# Patient Record
Sex: Male | Born: 1950
Health system: Southern US, Community
[De-identification: ages and names within clinical notes are randomized; demographics above are authoritative.]

## PROBLEM LIST (undated history)

## (undated) VITALS — BP 120/77 | HR 102 | Temp 98.4°F | Resp 16

## (undated) DIAGNOSIS — G8929 Other chronic pain: Secondary | ICD-10-CM

## (undated) DIAGNOSIS — R945 Abnormal results of liver function studies: Secondary | ICD-10-CM

## (undated) DIAGNOSIS — F32A Depression, unspecified: Secondary | ICD-10-CM

## (undated) DIAGNOSIS — E785 Hyperlipidemia, unspecified: Secondary | ICD-10-CM

## (undated) DIAGNOSIS — H9190 Unspecified hearing loss, unspecified ear: Secondary | ICD-10-CM

## (undated) DIAGNOSIS — R6889 Other general symptoms and signs: Secondary | ICD-10-CM

## (undated) DIAGNOSIS — J189 Pneumonia, unspecified organism: Secondary | ICD-10-CM

## (undated) DIAGNOSIS — H409 Unspecified glaucoma: Secondary | ICD-10-CM

## (undated) DIAGNOSIS — F1010 Alcohol abuse, uncomplicated: Secondary | ICD-10-CM

## (undated) DIAGNOSIS — Z9989 Dependence on other enabling machines and devices: Secondary | ICD-10-CM

## (undated) DIAGNOSIS — F329 Major depressive disorder, single episode, unspecified: Secondary | ICD-10-CM

## (undated) DIAGNOSIS — K7460 Unspecified cirrhosis of liver: Secondary | ICD-10-CM

## (undated) DIAGNOSIS — J449 Chronic obstructive pulmonary disease, unspecified: Secondary | ICD-10-CM

## (undated) DIAGNOSIS — J439 Emphysema, unspecified: Secondary | ICD-10-CM

## (undated) DIAGNOSIS — F419 Anxiety disorder, unspecified: Secondary | ICD-10-CM

## (undated) DIAGNOSIS — H269 Unspecified cataract: Secondary | ICD-10-CM

## (undated) DIAGNOSIS — I639 Cerebral infarction, unspecified: Secondary | ICD-10-CM

## (undated) DIAGNOSIS — S0993XA Unspecified injury of face, initial encounter: Secondary | ICD-10-CM

## (undated) DIAGNOSIS — D649 Anemia, unspecified: Secondary | ICD-10-CM

## (undated) DIAGNOSIS — J329 Chronic sinusitis, unspecified: Secondary | ICD-10-CM

## (undated) DIAGNOSIS — M779 Enthesopathy, unspecified: Secondary | ICD-10-CM

## (undated) DIAGNOSIS — E119 Type 2 diabetes mellitus without complications: Secondary | ICD-10-CM

## (undated) DIAGNOSIS — M1990 Unspecified osteoarthritis, unspecified site: Secondary | ICD-10-CM

## (undated) DIAGNOSIS — Z7409 Other reduced mobility: Secondary | ICD-10-CM

## (undated) DIAGNOSIS — W19XXXA Unspecified fall, initial encounter: Secondary | ICD-10-CM

## (undated) DIAGNOSIS — I10 Essential (primary) hypertension: Secondary | ICD-10-CM

## (undated) HISTORY — DX: Emphysema, unspecified: J43.9

## (undated) HISTORY — PX: COLONOSCOPY: SHX174

## (undated) HISTORY — DX: Chronic obstructive pulmonary disease, unspecified: J44.9

## (undated) HISTORY — DX: Major depressive disorder, single episode, unspecified: F32.9

## (undated) HISTORY — DX: Unspecified glaucoma: H40.9

## (undated) HISTORY — PX: MOUTH SURGERY: SHX715

## (undated) HISTORY — DX: Depression, unspecified: F32.A

## (undated) HISTORY — PX: CATARACT EXTRACTION W/ INTRAOCULAR LENS IMPLANT: SHX1309

## (undated) HISTORY — DX: Unspecified cataract: H26.9

---

## 2002-04-08 ENCOUNTER — Emergency Department (HOSPITAL_COMMUNITY): Admission: EM | Admit: 2002-04-08 | Discharge: 2002-04-08 | Payer: Self-pay | Admitting: Emergency Medicine

## 2004-03-26 ENCOUNTER — Emergency Department (HOSPITAL_COMMUNITY): Admission: EM | Admit: 2004-03-26 | Discharge: 2004-03-26 | Payer: Self-pay | Admitting: *Deleted

## 2009-08-08 ENCOUNTER — Emergency Department (HOSPITAL_COMMUNITY): Admission: EM | Admit: 2009-08-08 | Discharge: 2009-08-08 | Payer: Self-pay | Admitting: Emergency Medicine

## 2010-01-30 ENCOUNTER — Emergency Department (HOSPITAL_COMMUNITY): Admission: EM | Admit: 2010-01-30 | Discharge: 2010-01-30 | Payer: Self-pay | Admitting: Emergency Medicine

## 2010-01-31 ENCOUNTER — Emergency Department (HOSPITAL_COMMUNITY): Admission: EM | Admit: 2010-01-31 | Discharge: 2010-02-01 | Payer: Self-pay | Admitting: Emergency Medicine

## 2010-02-01 ENCOUNTER — Emergency Department (HOSPITAL_COMMUNITY): Admission: EM | Admit: 2010-02-01 | Discharge: 2010-02-01 | Payer: Self-pay | Admitting: Emergency Medicine

## 2010-02-11 ENCOUNTER — Emergency Department (HOSPITAL_COMMUNITY): Admission: EM | Admit: 2010-02-11 | Discharge: 2010-02-11 | Payer: Self-pay | Admitting: Emergency Medicine

## 2010-06-24 ENCOUNTER — Emergency Department (HOSPITAL_COMMUNITY)
Admission: EM | Admit: 2010-06-24 | Discharge: 2010-06-25 | Payer: Self-pay | Source: Home / Self Care | Admitting: Emergency Medicine

## 2010-09-10 ENCOUNTER — Inpatient Hospital Stay (INDEPENDENT_AMBULATORY_CARE_PROVIDER_SITE_OTHER)
Admission: RE | Admit: 2010-09-10 | Discharge: 2010-09-10 | Disposition: A | Discharge status: Home / Self Care | Payer: Self-pay | Source: Ambulatory Visit | Attending: Family Medicine | Admitting: Family Medicine

## 2010-09-10 ENCOUNTER — Ambulatory Visit (INDEPENDENT_AMBULATORY_CARE_PROVIDER_SITE_OTHER): Payer: Self-pay

## 2010-09-10 DIAGNOSIS — S42009A Fracture of unspecified part of unspecified clavicle, initial encounter for closed fracture: Secondary | ICD-10-CM

## 2010-09-16 LAB — BASIC METABOLIC PANEL
Creatinine, Ser: 0.99 mg/dL (ref 0.4–1.5)
GFR calc non Af Amer: >60 mL/min (ref >60)
Sodium: 136 mEq/L (ref 135–145)

## 2010-09-16 LAB — DIFFERENTIAL
Basophils Absolute: 0 10*3/uL (ref 0.0–0.1)
Basophils Relative: 1 % (ref 0–1)
Eosinophils Relative: 1 % (ref 0–5)
Monocytes Absolute: 0.9 10*3/uL (ref 0.1–1.0)
Neutro Abs: 1.5 10*3/uL — ABNORMAL LOW (ref 1.7–7.7)
Neutrophils Relative %: 39 % — ABNORMAL LOW (ref 43–77)

## 2010-09-16 LAB — URINALYSIS, ROUTINE W REFLEX MICROSCOPIC
Glucose, UA: NEGATIVE mg/dL
Hgb urine dipstick: NEGATIVE
Ketones, ur: NEGATIVE mg/dL

## 2010-09-16 LAB — CBC
Hemoglobin: 12.7 g/dL — ABNORMAL LOW (ref 13.0–17.0)
MCHC: 34.1 g/dL (ref 30.0–36.0)
MCV: 76.1 fL — ABNORMAL LOW (ref 78.0–100.0)
Platelets: 288 10*3/uL (ref 150–400)
RBC: 4.89 MIL/uL (ref 4.22–5.81)
RDW: 15.1 % (ref 11.5–15.5)

## 2010-09-16 LAB — RAPID URINE DRUG SCREEN, HOSP PERFORMED
Cocaine: POSITIVE — AB
Opiates: NOT DETECTED
Tetrahydrocannabinol: NOT DETECTED

## 2010-09-16 LAB — ETHANOL: Alcohol, Ethyl (B): <5 mg/dL (ref 0–10)

## 2010-09-20 LAB — HEPATIC FUNCTION PANEL
AST: 58 U/L — ABNORMAL HIGH (ref 0–37)
Bilirubin, Direct: 0.1 mg/dL (ref 0.0–0.3)
Indirect Bilirubin: 0.6 mg/dL (ref 0.3–0.9)
Total Bilirubin: 0.7 mg/dL (ref 0.3–1.2)
Total Protein: 8.1 g/dL (ref 6.0–8.3)

## 2010-09-20 LAB — DIFFERENTIAL
Eosinophils Absolute: 0 10*3/uL (ref 0.0–0.7)
Eosinophils Relative: 0 % (ref 0–5)
Lymphocytes Relative: 42 % (ref 12–46)
Metamyelocytes Relative: 0 %
Monocytes Absolute: 0.2 10*3/uL (ref 0.1–1.0)
Neutro Abs: 2.9 10*3/uL (ref 1.7–7.7)

## 2010-09-20 LAB — BASIC METABOLIC PANEL
Calcium: 9 mg/dL (ref 8.4–10.5)
Chloride: 94 mEq/L — ABNORMAL LOW (ref 96–112)
GFR calc Af Amer: >60 mL/min (ref >60)
Glucose, Bld: 86 mg/dL (ref 70–99)
Sodium: 130 mEq/L — ABNORMAL LOW (ref 135–145)

## 2010-09-20 LAB — CBC
HCT: 33.7 % — ABNORMAL LOW (ref 39.0–52.0)
Hemoglobin: 11.3 g/dL — ABNORMAL LOW (ref 13.0–17.0)
MCH: 27.6 pg (ref 26.0–34.0)
MCV: 82.6 fL (ref 78.0–100.0)
Platelets: 327 10*3/uL (ref 150–400)
RDW: 16 % — ABNORMAL HIGH (ref 11.5–15.5)
WBC: 5.4 10*3/uL (ref 4.0–10.5)

## 2010-09-20 LAB — ETHANOL: Alcohol, Ethyl (B): 85 mg/dL — ABNORMAL HIGH (ref 0–10)

## 2010-09-20 LAB — TRICYCLICS SCREEN, URINE: TCA Scrn: NOT DETECTED

## 2010-09-20 LAB — RAPID URINE DRUG SCREEN, HOSP PERFORMED
Barbiturates: NOT DETECTED
Cocaine: NOT DETECTED

## 2010-09-21 LAB — DIFFERENTIAL: Eosinophils Relative: 0 % (ref 0–5)

## 2010-09-21 LAB — CBC
Hemoglobin: 11.6 g/dL — ABNORMAL LOW (ref 13.0–17.0)
MCHC: 32.5 g/dL (ref 30.0–36.0)
MCV: 84.1 fL (ref 78.0–100.0)
Platelets: 219 10*3/uL (ref 150–400)
RBC: 4.24 MIL/uL (ref 4.22–5.81)
RDW: 15.9 % — ABNORMAL HIGH (ref 11.5–15.5)
WBC: 3.7 10*3/uL — ABNORMAL LOW (ref 4.0–10.5)

## 2010-09-21 LAB — ETHANOL: Alcohol, Ethyl (B): 268 mg/dL — ABNORMAL HIGH (ref 0–10)

## 2010-09-21 LAB — COMPREHENSIVE METABOLIC PANEL
Albumin: 4.5 g/dL (ref 3.5–5.2)
Alkaline Phosphatase: 71 U/L (ref 39–117)
BUN: 6 mg/dL (ref 6–23)
GFR calc Af Amer: >60 mL/min (ref >60)
GFR calc non Af Amer: >60 mL/min (ref >60)
Glucose, Bld: 82 mg/dL (ref 70–99)
Potassium: 4.5 mEq/L (ref 3.5–5.1)
Sodium: 138 mEq/L (ref 135–145)
Total Bilirubin: 0.4 mg/dL (ref 0.3–1.2)

## 2010-09-21 LAB — RAPID URINE DRUG SCREEN, HOSP PERFORMED
Cocaine: NOT DETECTED
Opiates: NOT DETECTED
Tetrahydrocannabinol: NOT DETECTED

## 2010-12-25 ENCOUNTER — Emergency Department (HOSPITAL_COMMUNITY)
Admission: EM | Admit: 2010-12-25 | Discharge: 2010-12-26 | Disposition: A | Discharge status: Home / Self Care | Payer: Self-pay | Attending: Emergency Medicine | Admitting: Emergency Medicine

## 2010-12-25 DIAGNOSIS — R062 Wheezing: Secondary | ICD-10-CM | POA: Insufficient documentation

## 2010-12-25 DIAGNOSIS — R29898 Other symptoms and signs involving the musculoskeletal system: Secondary | ICD-10-CM | POA: Insufficient documentation

## 2010-12-25 DIAGNOSIS — F1010 Alcohol abuse, uncomplicated: Secondary | ICD-10-CM | POA: Insufficient documentation

## 2010-12-25 DIAGNOSIS — M25519 Pain in unspecified shoulder: Secondary | ICD-10-CM | POA: Insufficient documentation

## 2010-12-26 ENCOUNTER — Emergency Department (HOSPITAL_COMMUNITY): Payer: Self-pay

## 2010-12-26 LAB — CBC
Hemoglobin: 11 g/dL — ABNORMAL LOW (ref 13.0–17.0)
MCH: 26.4 pg (ref 26.0–34.0)
MCV: 78.6 fL (ref 78.0–100.0)
Platelets: 193 10*3/uL (ref 150–400)
RDW: 16.8 % — ABNORMAL HIGH (ref 11.5–15.5)
WBC: 5.5 10*3/uL (ref 4.0–10.5)

## 2010-12-26 LAB — DIFFERENTIAL
Basophils Absolute: 0.1 10*3/uL (ref 0.0–0.1)
Eosinophils Absolute: 0 10*3/uL (ref 0.0–0.7)
Eosinophils Relative: 0 % (ref 0–5)
Lymphocytes Relative: 25 % (ref 12–46)
Lymphs Abs: 1.4 10*3/uL (ref 0.7–4.0)
Monocytes Relative: 10 % (ref 3–12)
Neutro Abs: 3.5 10*3/uL (ref 1.7–7.7)
Neutrophils Relative %: 64 % (ref 43–77)

## 2010-12-26 LAB — URINE MICROSCOPIC-ADD ON

## 2010-12-26 LAB — URINALYSIS, ROUTINE W REFLEX MICROSCOPIC
Bilirubin Urine: NEGATIVE
Nitrite: NEGATIVE
Specific Gravity, Urine: 1.022 (ref 1.005–1.030)
pH: 5.5 (ref 5.0–8.0)

## 2010-12-26 LAB — ETHANOL: Alcohol, Ethyl (B): 135 mg/dL — ABNORMAL HIGH (ref 0–11)

## 2010-12-26 LAB — BASIC METABOLIC PANEL
BUN: 7 mg/dL (ref 6–23)
Chloride: 99 mEq/L (ref 96–112)
GFR calc Af Amer: >60 mL/min (ref >60)
Glucose, Bld: 93 mg/dL (ref 70–99)
Potassium: 4 mEq/L (ref 3.5–5.1)

## 2010-12-26 LAB — RAPID URINE DRUG SCREEN, HOSP PERFORMED
Amphetamines: NOT DETECTED
Barbiturates: NOT DETECTED

## 2011-02-12 ENCOUNTER — Emergency Department (HOSPITAL_COMMUNITY)
Admission: EM | Admit: 2011-02-12 | Discharge: 2011-02-13 | Disposition: A | Discharge status: Home / Self Care | Payer: Self-pay | Attending: Emergency Medicine | Admitting: Emergency Medicine

## 2011-02-12 ENCOUNTER — Emergency Department (HOSPITAL_COMMUNITY): Payer: Self-pay

## 2011-02-12 DIAGNOSIS — R05 Cough: Secondary | ICD-10-CM | POA: Insufficient documentation

## 2011-02-12 DIAGNOSIS — F1910 Other psychoactive substance abuse, uncomplicated: Secondary | ICD-10-CM | POA: Insufficient documentation

## 2011-02-12 DIAGNOSIS — R059 Cough, unspecified: Secondary | ICD-10-CM | POA: Insufficient documentation

## 2011-02-12 LAB — COMPREHENSIVE METABOLIC PANEL
ALT: 58 U/L — ABNORMAL HIGH (ref 0–53)
AST: 91 U/L — ABNORMAL HIGH (ref 0–37)
Alkaline Phosphatase: 76 U/L (ref 39–117)
CO2: 20 mEq/L (ref 19–32)
Chloride: 99 mEq/L (ref 96–112)
GFR calc Af Amer: >60 mL/min (ref >60)
GFR calc non Af Amer: >60 mL/min (ref >60)
Glucose, Bld: 79 mg/dL (ref 70–99)
Potassium: 3.9 mEq/L (ref 3.5–5.1)
Sodium: 136 mEq/L (ref 135–145)

## 2011-02-12 LAB — URINALYSIS, ROUTINE W REFLEX MICROSCOPIC
Ketones, ur: 15 mg/dL — AB
Nitrite: NEGATIVE
Specific Gravity, Urine: 1.026 (ref 1.005–1.030)
Urobilinogen, UA: 1 mg/dL (ref 0.0–1.0)
pH: 5 (ref 5.0–8.0)

## 2011-02-12 LAB — CBC
Hemoglobin: 11.2 g/dL — ABNORMAL LOW (ref 13.0–17.0)
MCH: 27.1 pg (ref 26.0–34.0)
Platelets: 215 10*3/uL (ref 150–400)
RBC: 4.13 MIL/uL — ABNORMAL LOW (ref 4.22–5.81)
WBC: 5 10*3/uL (ref 4.0–10.5)

## 2011-02-12 LAB — RAPID URINE DRUG SCREEN, HOSP PERFORMED
Barbiturates: NOT DETECTED
Tetrahydrocannabinol: NOT DETECTED

## 2011-02-12 LAB — DIFFERENTIAL
Basophils Absolute: 0 10*3/uL (ref 0.0–0.1)
Basophils Relative: 0 % (ref 0–1)
Monocytes Relative: 11 % (ref 3–12)
Neutro Abs: 2.8 10*3/uL (ref 1.7–7.7)
Neutrophils Relative %: 56 % (ref 43–77)

## 2011-02-12 LAB — URINE MICROSCOPIC-ADD ON

## 2011-02-13 LAB — URINE CULTURE
Culture  Setup Time: 201208090120
Culture: NO GROWTH

## 2011-02-17 ENCOUNTER — Emergency Department (HOSPITAL_COMMUNITY)
Admission: EM | Admit: 2011-02-17 | Discharge: 2011-02-18 | Disposition: A | Discharge status: Home / Self Care | Payer: Self-pay | Attending: Emergency Medicine | Admitting: Emergency Medicine

## 2011-02-17 DIAGNOSIS — F1010 Alcohol abuse, uncomplicated: Secondary | ICD-10-CM | POA: Insufficient documentation

## 2011-02-17 DIAGNOSIS — Z5900 Homelessness unspecified: Secondary | ICD-10-CM | POA: Insufficient documentation

## 2011-02-17 DIAGNOSIS — F17200 Nicotine dependence, unspecified, uncomplicated: Secondary | ICD-10-CM | POA: Insufficient documentation

## 2011-02-17 LAB — COMPREHENSIVE METABOLIC PANEL
ALT: 47 U/L (ref 0–53)
AST: 62 U/L — ABNORMAL HIGH (ref 0–37)
Albumin: 4.2 g/dL (ref 3.5–5.2)
Alkaline Phosphatase: 77 U/L (ref 39–117)
CO2: 24 mEq/L (ref 19–32)
Chloride: 101 mEq/L (ref 96–112)
GFR calc non Af Amer: >60 mL/min (ref >60)
Potassium: 3.8 mEq/L (ref 3.5–5.1)
Sodium: 139 mEq/L (ref 135–145)
Total Bilirubin: 0.2 mg/dL — ABNORMAL LOW (ref 0.3–1.2)

## 2011-02-17 LAB — CBC
Platelets: 256 10*3/uL (ref 150–400)
RBC: 4.06 MIL/uL — ABNORMAL LOW (ref 4.22–5.81)
RDW: 17.3 % — ABNORMAL HIGH (ref 11.5–15.5)
WBC: 4.6 10*3/uL (ref 4.0–10.5)

## 2011-02-17 LAB — DIFFERENTIAL
Basophils Absolute: 0 10*3/uL (ref 0.0–0.1)
Eosinophils Absolute: 0.1 10*3/uL (ref 0.0–0.7)
Eosinophils Relative: 1 % (ref 0–5)
Lymphs Abs: 2.2 10*3/uL (ref 0.7–4.0)
Neutrophils Relative %: 35 % — ABNORMAL LOW (ref 43–77)

## 2011-02-17 LAB — RAPID URINE DRUG SCREEN, HOSP PERFORMED
Amphetamines: NOT DETECTED
Barbiturates: NOT DETECTED
Opiates: NOT DETECTED
Tetrahydrocannabinol: NOT DETECTED

## 2011-02-17 LAB — URINALYSIS, ROUTINE W REFLEX MICROSCOPIC
Hgb urine dipstick: NEGATIVE
Nitrite: NEGATIVE
Protein, ur: NEGATIVE mg/dL
Urobilinogen, UA: 0.2 mg/dL (ref 0.0–1.0)

## 2011-02-18 LAB — ETHANOL: Alcohol, Ethyl (B): <11 mg/dL (ref 0–11)

## 2011-04-25 ENCOUNTER — Ambulatory Visit (INDEPENDENT_AMBULATORY_CARE_PROVIDER_SITE_OTHER): Payer: Self-pay | Admitting: Internal Medicine

## 2011-04-25 DIAGNOSIS — R0989 Other specified symptoms and signs involving the circulatory and respiratory systems: Secondary | ICD-10-CM

## 2011-04-25 LAB — PULMONARY FUNCTION TEST

## 2011-04-25 NOTE — Progress Notes (Signed)
PFT done today. 

## 2011-07-08 HISTORY — PX: OTHER SURGICAL HISTORY: SHX169

## 2011-11-05 ENCOUNTER — Other Ambulatory Visit: Payer: Self-pay | Admitting: Gastroenterology

## 2011-11-10 ENCOUNTER — Other Ambulatory Visit: Payer: Self-pay

## 2011-11-13 ENCOUNTER — Other Ambulatory Visit: Payer: Self-pay

## 2011-11-19 ENCOUNTER — Other Ambulatory Visit: Payer: Self-pay

## 2012-01-03 ENCOUNTER — Encounter (HOSPITAL_COMMUNITY): Payer: Self-pay | Admitting: *Deleted

## 2012-01-03 ENCOUNTER — Inpatient Hospital Stay (HOSPITAL_COMMUNITY)
Admission: EM | Admit: 2012-01-03 | Discharge: 2012-01-09 | DRG: 177 | Disposition: A | Discharge status: Home Health | Payer: Medicaid Other | Attending: Internal Medicine | Admitting: Internal Medicine

## 2012-01-03 ENCOUNTER — Emergency Department (HOSPITAL_COMMUNITY): Payer: Medicaid Other

## 2012-01-03 DIAGNOSIS — W19XXXA Unspecified fall, initial encounter: Secondary | ICD-10-CM | POA: Diagnosis present

## 2012-01-03 DIAGNOSIS — R55 Syncope and collapse: Secondary | ICD-10-CM | POA: Diagnosis present

## 2012-01-03 DIAGNOSIS — K859 Acute pancreatitis without necrosis or infection, unspecified: Secondary | ICD-10-CM | POA: Diagnosis present

## 2012-01-03 DIAGNOSIS — J189 Pneumonia, unspecified organism: Secondary | ICD-10-CM | POA: Diagnosis present

## 2012-01-03 DIAGNOSIS — I951 Orthostatic hypotension: Secondary | ICD-10-CM

## 2012-01-03 DIAGNOSIS — F10931 Alcohol use, unspecified with withdrawal delirium: Secondary | ICD-10-CM | POA: Diagnosis present

## 2012-01-03 DIAGNOSIS — J449 Chronic obstructive pulmonary disease, unspecified: Secondary | ICD-10-CM | POA: Diagnosis present

## 2012-01-03 DIAGNOSIS — D72829 Elevated white blood cell count, unspecified: Secondary | ICD-10-CM

## 2012-01-03 DIAGNOSIS — F1010 Alcohol abuse, uncomplicated: Secondary | ICD-10-CM

## 2012-01-03 DIAGNOSIS — F10939 Alcohol use, unspecified with withdrawal, unspecified: Secondary | ICD-10-CM | POA: Diagnosis present

## 2012-01-03 DIAGNOSIS — R748 Abnormal levels of other serum enzymes: Secondary | ICD-10-CM | POA: Diagnosis present

## 2012-01-03 DIAGNOSIS — F10239 Alcohol dependence with withdrawal, unspecified: Secondary | ICD-10-CM | POA: Diagnosis present

## 2012-01-03 DIAGNOSIS — K766 Portal hypertension: Secondary | ICD-10-CM | POA: Diagnosis present

## 2012-01-03 DIAGNOSIS — I10 Essential (primary) hypertension: Secondary | ICD-10-CM | POA: Diagnosis present

## 2012-01-03 DIAGNOSIS — S2249XA Multiple fractures of ribs, unspecified side, initial encounter for closed fracture: Secondary | ICD-10-CM | POA: Diagnosis present

## 2012-01-03 DIAGNOSIS — J4489 Other specified chronic obstructive pulmonary disease: Secondary | ICD-10-CM | POA: Diagnosis present

## 2012-01-03 DIAGNOSIS — R945 Abnormal results of liver function studies: Secondary | ICD-10-CM

## 2012-01-03 DIAGNOSIS — E876 Hypokalemia: Secondary | ICD-10-CM | POA: Diagnosis present

## 2012-01-03 DIAGNOSIS — F1027 Alcohol dependence with alcohol-induced persisting dementia: Secondary | ICD-10-CM | POA: Diagnosis present

## 2012-01-03 DIAGNOSIS — R197 Diarrhea, unspecified: Secondary | ICD-10-CM

## 2012-01-03 DIAGNOSIS — D509 Iron deficiency anemia, unspecified: Secondary | ICD-10-CM | POA: Diagnosis present

## 2012-01-03 DIAGNOSIS — F10229 Alcohol dependence with intoxication, unspecified: Secondary | ICD-10-CM | POA: Diagnosis present

## 2012-01-03 DIAGNOSIS — K319 Disease of stomach and duodenum, unspecified: Secondary | ICD-10-CM | POA: Diagnosis present

## 2012-01-03 DIAGNOSIS — F10231 Alcohol dependence with withdrawal delirium: Secondary | ICD-10-CM | POA: Diagnosis present

## 2012-01-03 DIAGNOSIS — A419 Sepsis, unspecified organism: Secondary | ICD-10-CM

## 2012-01-03 DIAGNOSIS — E785 Hyperlipidemia, unspecified: Secondary | ICD-10-CM

## 2012-01-03 DIAGNOSIS — F17200 Nicotine dependence, unspecified, uncomplicated: Secondary | ICD-10-CM | POA: Diagnosis present

## 2012-01-03 DIAGNOSIS — K921 Melena: Secondary | ICD-10-CM | POA: Diagnosis present

## 2012-01-03 DIAGNOSIS — Z8673 Personal history of transient ischemic attack (TIA), and cerebral infarction without residual deficits: Secondary | ICD-10-CM

## 2012-01-03 DIAGNOSIS — E860 Dehydration: Secondary | ICD-10-CM | POA: Diagnosis present

## 2012-01-03 DIAGNOSIS — S2239XA Fracture of one rib, unspecified side, initial encounter for closed fracture: Secondary | ICD-10-CM

## 2012-01-03 DIAGNOSIS — K2970 Gastritis, unspecified, without bleeding: Secondary | ICD-10-CM | POA: Diagnosis present

## 2012-01-03 DIAGNOSIS — J690 Pneumonitis due to inhalation of food and vomit: Principal | ICD-10-CM | POA: Diagnosis present

## 2012-01-03 DIAGNOSIS — D649 Anemia, unspecified: Secondary | ICD-10-CM | POA: Diagnosis present

## 2012-01-03 DIAGNOSIS — K7030 Alcoholic cirrhosis of liver without ascites: Secondary | ICD-10-CM | POA: Diagnosis present

## 2012-01-03 DIAGNOSIS — Z7982 Long term (current) use of aspirin: Secondary | ICD-10-CM

## 2012-01-03 DIAGNOSIS — K31819 Angiodysplasia of stomach and duodenum without bleeding: Secondary | ICD-10-CM | POA: Diagnosis present

## 2012-01-03 DIAGNOSIS — Y92009 Unspecified place in unspecified non-institutional (private) residence as the place of occurrence of the external cause: Secondary | ICD-10-CM

## 2012-01-03 DIAGNOSIS — R1319 Other dysphagia: Secondary | ICD-10-CM | POA: Diagnosis present

## 2012-01-03 HISTORY — DX: Other chronic pain: G89.29

## 2012-01-03 HISTORY — DX: Essential (primary) hypertension: I10

## 2012-01-03 HISTORY — DX: Pneumonia, unspecified organism: J18.9

## 2012-01-03 HISTORY — DX: Other reduced mobility: Z74.09

## 2012-01-03 HISTORY — DX: Enthesopathy, unspecified: M77.9

## 2012-01-03 HISTORY — DX: Emphysema, unspecified: J43.9

## 2012-01-03 HISTORY — DX: Other general symptoms and signs: R68.89

## 2012-01-03 HISTORY — DX: Unspecified injury of face, initial encounter: S09.93XA

## 2012-01-03 HISTORY — DX: Unspecified fall, initial encounter: W19.XXXA

## 2012-01-03 HISTORY — DX: Hyperlipidemia, unspecified: E78.5

## 2012-01-03 HISTORY — DX: Cerebral infarction, unspecified: I63.9

## 2012-01-03 HISTORY — DX: Unspecified cirrhosis of liver: K74.60

## 2012-01-03 LAB — CBC WITH DIFFERENTIAL/PLATELET
Hemoglobin: 10.9 g/dL — ABNORMAL LOW (ref 13.0–17.0)
Lymphocytes Relative: 3 % — ABNORMAL LOW (ref 12–46)
Lymphs Abs: 0.3 10*3/uL — ABNORMAL LOW (ref 0.7–4.0)
MCH: 26.7 pg (ref 26.0–34.0)
Monocytes Relative: 10 % (ref 3–12)
Neutro Abs: 8.5 10*3/uL — ABNORMAL HIGH (ref 1.7–7.7)
Neutrophils Relative %: 86 % — ABNORMAL HIGH (ref 43–77)
RBC: 4.08 MIL/uL — ABNORMAL LOW (ref 4.22–5.81)
WBC: 9.8 10*3/uL (ref 4.0–10.5)

## 2012-01-03 LAB — POCT I-STAT, CHEM 8
Calcium, Ion: 1.09 mmol/L — ABNORMAL LOW (ref 1.12–1.32)
Chloride: 104 mEq/L (ref 96–112)
Glucose, Bld: 116 mg/dL — ABNORMAL HIGH (ref 70–99)
HCT: 36 % — ABNORMAL LOW (ref 39.0–52.0)
Hemoglobin: 12.2 g/dL — ABNORMAL LOW (ref 13.0–17.0)
Potassium: 3.6 mEq/L (ref 3.5–5.1)

## 2012-01-03 LAB — HEPATIC FUNCTION PANEL
AST: 106 U/L — ABNORMAL HIGH (ref 0–37)
Albumin: 3.6 g/dL (ref 3.5–5.2)
Total Protein: 8.4 g/dL — ABNORMAL HIGH (ref 6.0–8.3)

## 2012-01-03 LAB — URINALYSIS, ROUTINE W REFLEX MICROSCOPIC
Glucose, UA: NEGATIVE mg/dL
Hgb urine dipstick: NEGATIVE
Leukocytes, UA: NEGATIVE
Specific Gravity, Urine: 1.015 (ref 1.005–1.030)

## 2012-01-03 LAB — TYPE AND SCREEN
ABO/RH(D): O POS
Antibody Screen: NEGATIVE

## 2012-01-03 LAB — RAPID URINE DRUG SCREEN, HOSP PERFORMED
Amphetamines: NOT DETECTED
Benzodiazepines: NOT DETECTED
Opiates: NOT DETECTED

## 2012-01-03 LAB — FERRITIN: Ferritin: 645 ng/mL — ABNORMAL HIGH (ref 22–322)

## 2012-01-03 LAB — CBC
MCH: 26.2 pg (ref 26.0–34.0)
MCHC: 33.2 g/dL (ref 30.0–36.0)
RDW: 16.1 % — ABNORMAL HIGH (ref 11.5–15.5)

## 2012-01-03 LAB — STREP PNEUMONIAE URINARY ANTIGEN: Strep Pneumo Urinary Antigen: NEGATIVE

## 2012-01-03 LAB — AMMONIA: Ammonia: 37 umol/L (ref 11–60)

## 2012-01-03 LAB — ETHANOL: Alcohol, Ethyl (B): 103 mg/dL — ABNORMAL HIGH (ref 0–11)

## 2012-01-03 LAB — PROTIME-INR
INR: 0.99 (ref 0.00–1.49)
INR: 1.13 (ref 0.00–1.49)
Prothrombin Time: 13.3 seconds (ref 11.6–15.2)

## 2012-01-03 LAB — CREATININE, SERUM
Creatinine, Ser: 0.81 mg/dL (ref 0.50–1.35)
GFR calc non Af Amer: >90 mL/min (ref >90)

## 2012-01-03 LAB — TROPONIN I: Troponin I: <0.3 ng/mL (ref <0.30)

## 2012-01-03 LAB — IRON AND TIBC: UIBC: 226 ug/dL (ref 125–400)

## 2012-01-03 LAB — RETICULOCYTES
RBC.: 3.45 MIL/uL — ABNORMAL LOW (ref 4.22–5.81)
Retic Count, Absolute: 44.9 10*3/uL (ref 19.0–186.0)

## 2012-01-03 LAB — LACTIC ACID, PLASMA: Lactic Acid, Venous: 3.8 mmol/L — ABNORMAL HIGH (ref 0.5–2.2)

## 2012-01-03 LAB — ABO/RH: ABO/RH(D): O POS

## 2012-01-03 LAB — LIPASE, BLOOD: Lipase: 77 U/L — ABNORMAL HIGH (ref 11–59)

## 2012-01-03 MED ORDER — THIAMINE HCL 100 MG/ML IJ SOLN
Freq: Once | INTRAVENOUS | Status: AC
  Administered 2012-01-03: 17:00:00 via INTRAVENOUS
  Filled 2012-01-03: qty 1000

## 2012-01-03 MED ORDER — ALBUTEROL SULFATE HFA 108 (90 BASE) MCG/ACT IN AERS: 2.0000 | INHALATION_SPRAY | Freq: Four times a day (QID) | RESPIRATORY_TRACT | Status: DC | PRN

## 2012-01-03 MED ORDER — LORAZEPAM 2 MG/ML IJ SOLN
2.0000 mg | Freq: Once | INTRAMUSCULAR | Status: AC
  Administered 2012-01-03: 2 mg via INTRAVENOUS
  Filled 2012-01-03: qty 1

## 2012-01-03 MED ORDER — SIMVASTATIN 20 MG PO TABS
20.0000 mg | ORAL_TABLET | Freq: Every day | ORAL | Status: DC
  Administered 2012-01-03 – 2012-01-08 (×5): 20 mg via ORAL
  Filled 2012-01-03 (×7): qty 1

## 2012-01-03 MED ORDER — LORAZEPAM 2 MG/ML IJ SOLN: 1.0000 mg | Freq: Once | INTRAMUSCULAR | Status: DC

## 2012-01-03 MED ORDER — FERROUS SULFATE 325 (65 FE) MG PO TABS
325.0000 mg | ORAL_TABLET | Freq: Every day | ORAL | Status: DC
  Administered 2012-01-04 – 2012-01-07 (×3): 325 mg via ORAL
  Filled 2012-01-03 (×5): qty 1

## 2012-01-03 MED ORDER — ACETAMINOPHEN 650 MG RE SUPP: 650.0000 mg | Freq: Four times a day (QID) | RECTAL | Status: DC | PRN

## 2012-01-03 MED ORDER — OXYCODONE HCL 5 MG PO TABS
5.0000 mg | ORAL_TABLET | ORAL | Status: DC | PRN
  Administered 2012-01-04 – 2012-01-05 (×4): 5 mg via ORAL
  Filled 2012-01-03 (×4): qty 1

## 2012-01-03 MED ORDER — PANTOPRAZOLE SODIUM 40 MG IV SOLR
40.0000 mg | Freq: Two times a day (BID) | INTRAVENOUS | Status: DC
  Administered 2012-01-03 – 2012-01-08 (×11): 40 mg via INTRAVENOUS
  Filled 2012-01-03 (×15): qty 40

## 2012-01-03 MED ORDER — MORPHINE SULFATE 4 MG/ML IJ SOLN
4.0000 mg | INTRAMUSCULAR | Status: DC | PRN
  Administered 2012-01-08: 4 mg via INTRAVENOUS
  Filled 2012-01-03: qty 1

## 2012-01-03 MED ORDER — PNEUMOCOCCAL VAC POLYVALENT 25 MCG/0.5ML IJ INJ
0.5000 mL | INJECTION | INTRAMUSCULAR | Status: AC
  Administered 2012-01-04: 0.5 mL via INTRAMUSCULAR
  Filled 2012-01-03: qty 0.5

## 2012-01-03 MED ORDER — MOXIFLOXACIN HCL IN NACL 400 MG/250ML IV SOLN: 400.0000 mg | INTRAVENOUS | Status: DC

## 2012-01-03 MED ORDER — VITAMIN B-1 100 MG PO TABS
100.0000 mg | ORAL_TABLET | Freq: Every day | ORAL | Status: DC
  Administered 2012-01-03 – 2012-01-08 (×4): 100 mg via ORAL
  Filled 2012-01-03 (×6): qty 1

## 2012-01-03 MED ORDER — ACETAMINOPHEN 325 MG PO TABS
650.0000 mg | ORAL_TABLET | Freq: Once | ORAL | Status: AC
  Administered 2012-01-03: 650 mg via ORAL
  Filled 2012-01-03: qty 1

## 2012-01-03 MED ORDER — SODIUM CHLORIDE 0.9 % IV BOLUS (SEPSIS)
1000.0000 mL | Freq: Once | INTRAVENOUS | Status: AC
  Administered 2012-01-03: 1000 mL via INTRAVENOUS

## 2012-01-03 MED ORDER — GUAIFENESIN-DM 100-10 MG/5ML PO SYRP
5.0000 mL | ORAL_SOLUTION | ORAL | Status: DC | PRN
  Filled 2012-01-03: qty 5

## 2012-01-03 MED ORDER — FOLIC ACID 1 MG PO TABS: 1.0000 mg | ORAL_TABLET | Freq: Every day | ORAL | Status: DC

## 2012-01-03 MED ORDER — VITAMIN B-1 100 MG PO TABS: 100.0000 mg | ORAL_TABLET | Freq: Every day | ORAL | Status: DC

## 2012-01-03 MED ORDER — VITAMIN B-1 100 MG PO TABS
100.0000 mg | ORAL_TABLET | Freq: Every day | ORAL | Status: DC
  Filled 2012-01-03: qty 1

## 2012-01-03 MED ORDER — DEXTROSE 5 % IV SOLN
1.0000 g | Freq: Once | INTRAVENOUS | Status: AC
  Administered 2012-01-03: 1 g via INTRAVENOUS
  Filled 2012-01-03: qty 10

## 2012-01-03 MED ORDER — ACETAMINOPHEN 325 MG PO TABS: 650.0000 mg | ORAL_TABLET | Freq: Four times a day (QID) | ORAL | Status: DC | PRN

## 2012-01-03 MED ORDER — ADULT MULTIVITAMIN W/MINERALS CH: 1.0000 | ORAL_TABLET | Freq: Every day | ORAL | Status: DC

## 2012-01-03 MED ORDER — PIPERACILLIN-TAZOBACTAM 3.375 G IVPB 30 MIN
3.3750 g | Freq: Once | INTRAVENOUS | Status: DC
  Filled 2012-01-03: qty 50

## 2012-01-03 MED ORDER — LORAZEPAM 2 MG/ML IJ SOLN
1.0000 mg | Freq: Four times a day (QID) | INTRAMUSCULAR | Status: AC | PRN
  Administered 2012-01-06 (×2): 1 mg via INTRAVENOUS
  Filled 2012-01-03 (×2): qty 1

## 2012-01-03 MED ORDER — ONDANSETRON HCL 4 MG/2ML IJ SOLN
4.0000 mg | Freq: Four times a day (QID) | INTRAMUSCULAR | Status: DC | PRN
  Administered 2012-01-03: 4 mg via INTRAVENOUS
  Filled 2012-01-03: qty 2

## 2012-01-03 MED ORDER — ALBUTEROL SULFATE (5 MG/ML) 0.5% IN NEBU
2.5000 mg | INHALATION_SOLUTION | Freq: Four times a day (QID) | RESPIRATORY_TRACT | Status: DC
  Administered 2012-01-03 – 2012-01-06 (×9): 2.5 mg via RESPIRATORY_TRACT
  Filled 2012-01-03 (×11): qty 0.5

## 2012-01-03 MED ORDER — SODIUM CHLORIDE 0.9 % IV SOLN
INTRAVENOUS | Status: DC
  Administered 2012-01-03: 16:00:00 via INTRAVENOUS

## 2012-01-03 MED ORDER — IOHEXOL 300 MG/ML  SOLN
100.0000 mL | Freq: Once | INTRAMUSCULAR | Status: AC | PRN
  Administered 2012-01-03: 100 mL via INTRAVENOUS

## 2012-01-03 MED ORDER — LORAZEPAM 2 MG/ML IJ SOLN
0.0000 mg | Freq: Four times a day (QID) | INTRAMUSCULAR | Status: AC
  Administered 2012-01-03: 2 mg via INTRAVENOUS
  Administered 2012-01-05: 1 mg via INTRAVENOUS
  Filled 2012-01-03: qty 1

## 2012-01-03 MED ORDER — IPRATROPIUM BROMIDE 0.02 % IN SOLN
0.5000 mg | Freq: Four times a day (QID) | RESPIRATORY_TRACT | Status: DC
  Administered 2012-01-03 – 2012-01-06 (×9): 0.5 mg via RESPIRATORY_TRACT
  Filled 2012-01-03 (×11): qty 2.5

## 2012-01-03 MED ORDER — ONDANSETRON HCL 4 MG PO TABS: 4.0000 mg | ORAL_TABLET | Freq: Four times a day (QID) | ORAL | Status: DC | PRN

## 2012-01-03 MED ORDER — THIAMINE HCL 100 MG/ML IJ SOLN
100.0000 mg | Freq: Every day | INTRAMUSCULAR | Status: DC
  Administered 2012-01-07: 09:00:00 via INTRAVENOUS
  Filled 2012-01-03 (×6): qty 1

## 2012-01-03 MED ORDER — ASPIRIN EC 81 MG PO TBEC
81.0000 mg | DELAYED_RELEASE_TABLET | Freq: Every day | ORAL | Status: DC
  Administered 2012-01-03 – 2012-01-08 (×4): 81 mg via ORAL
  Filled 2012-01-03 (×7): qty 1

## 2012-01-03 MED ORDER — ADULT MULTIVITAMIN W/MINERALS CH
1.0000 | ORAL_TABLET | Freq: Every day | ORAL | Status: DC
  Administered 2012-01-04 – 2012-01-08 (×3): 1 via ORAL
  Filled 2012-01-03 (×7): qty 1

## 2012-01-03 MED ORDER — ENOXAPARIN SODIUM 40 MG/0.4ML ~~LOC~~ SOLN
40.0000 mg | SUBCUTANEOUS | Status: DC
  Filled 2012-01-03: qty 0.4

## 2012-01-03 MED ORDER — PIPERACILLIN-TAZOBACTAM 3.375 G IVPB
3.3750 g | Freq: Three times a day (TID) | INTRAVENOUS | Status: DC
Start: 2012-01-03 — End: 2012-01-08
  Administered 2012-01-03 – 2012-01-08 (×14): 3.375 g via INTRAVENOUS
  Filled 2012-01-03 (×18): qty 50

## 2012-01-03 MED ORDER — THIAMINE HCL 100 MG/ML IJ SOLN
100.0000 mg | Freq: Every day | INTRAMUSCULAR | Status: DC
  Administered 2012-01-03: 10:00:00 via INTRAVENOUS
  Filled 2012-01-03: qty 2

## 2012-01-03 MED ORDER — LORAZEPAM 2 MG/ML IJ SOLN
1.0000 mg | Freq: Four times a day (QID) | INTRAMUSCULAR | Status: AC | PRN
  Administered 2012-01-06: 1 mg via INTRAVENOUS
  Filled 2012-01-03 (×3): qty 1

## 2012-01-03 MED ORDER — THIAMINE HCL 100 MG/ML IJ SOLN: 100.0000 mg | Freq: Every day | INTRAMUSCULAR | Status: DC

## 2012-01-03 MED ORDER — SODIUM CHLORIDE 0.9 % IJ SOLN
3.0000 mL | Freq: Two times a day (BID) | INTRAMUSCULAR | Status: DC
  Administered 2012-01-03 – 2012-01-06 (×4): 3 mL via INTRAVENOUS
  Administered 2012-01-07: 09:00:00 via INTRAVENOUS
  Administered 2012-01-07 – 2012-01-08 (×2): 3 mL via INTRAVENOUS

## 2012-01-03 MED ORDER — FOLIC ACID 1 MG PO TABS
1.0000 mg | ORAL_TABLET | Freq: Every day | ORAL | Status: DC
  Administered 2012-01-03 – 2012-01-08 (×4): 1 mg via ORAL
  Filled 2012-01-03 (×7): qty 1

## 2012-01-03 MED ORDER — LORAZEPAM 1 MG PO TABS: 1.0000 mg | ORAL_TABLET | Freq: Four times a day (QID) | ORAL | Status: AC | PRN

## 2012-01-03 MED ORDER — DEXTROSE 5 % IV SOLN
500.0000 mg | Freq: Once | INTRAVENOUS | Status: AC
  Administered 2012-01-03: 500 mg via INTRAVENOUS
  Filled 2012-01-03: qty 500

## 2012-01-03 MED ORDER — LORAZEPAM 2 MG/ML IJ SOLN
0.0000 mg | Freq: Two times a day (BID) | INTRAMUSCULAR | Status: AC
  Administered 2012-01-05: 1 mg via INTRAVENOUS
  Administered 2012-01-06: 2 mg via INTRAVENOUS
  Administered 2012-01-06: 1 mg via INTRAVENOUS
  Filled 2012-01-03: qty 2
  Filled 2012-01-03 (×2): qty 1

## 2012-01-03 NOTE — Progress Notes (Signed)
01/03/12 1415  Discharge Planning  Type of Residence Other (Comment) (unknown)  Home Care Services No  Support Systems Other relatives Blenda Bridegroom [phone])  Do you have any problems obtaining your medications? No  Family/patient expects to be discharged to: Private residence  Once you are discharged, how will you get to your follow-up appointment? Family  Expected Discharge Date 01/07/12  Case Management Consult Needed No  Social Work Consult Needed Yes (Comment)

## 2012-01-03 NOTE — ED Provider Notes (Addendum)
Complains of multiple falls and syncopal events over recent days admits to copious alcohol intake drinking 240 ounce beers per day last alcohol yesterday patient reports he fell off the porch yesterday injuring his left ribs no treatment prior to coming here his niece reports she vomited one time yesterday, yellow material with a fleck of blood. On exam chronically ill-appearing, cachectic HEENT exam normocephalic atraumatic neck supple lungs diffuse rhonchi heart tachycardic regular rhythm chest tender left ribs laterally no crepitus abdomen nondistended nontender pelvis stable nontender all 4 extremities without tenderness or deformity neurologic Glasgow Coma Score 15 tremulous. Medical decision-making CT scan ordered as patient has suffered trauma i.e. falling off a porch it and is an alcoholic in order to rule out traumatic injury or internal bleeding  Doug Sou, MD 01/03/12 [id number]  Patient to be admitted to medical service. I also spoke with Dr. Janee Morn from a trauma service regarding rib fractures. Trauma service will consult on patient while in the hospital 11:30 AM patient is alert Glasgow Coma Score 15 appears mildly tremulous Medical decision-making tachycardia likely secondary to dehydration fever and suspected alcohol withdrawal as patient has not drunk alcohol since yesterday CRITICAL CARE Performed by: Doug Sou   Total critical care time: 30 minute  Critical care time was exclusive of separately billable procedures and treating other patients.  Critical care was necessary to treat or prevent imminent or life-threatening deterioration.  Critical care was time spent personally by me on the following activities: development of treatment plan with patient and/or surrogate as well as nursing, discussions with consultants, evaluation of patient's response to treatment, examination of patient, obtaining history from patient or surrogate, ordering and performing treatments and  interventions, ordering and review of laboratory studies, ordering and review of radiographic studies, pulse oximetry and re-evaluation of patient's condition.  Doug Sou, MD 01/03/12 1137

## 2012-01-03 NOTE — Consult Note (Signed)
Reason for Consult:Left-sided rib fractures 5-7 Referring Physician: [name] is an 61 y.o. male.  HPI: Patient has been having multiple falls over the past month. He is a poor historian but does describe some syncope type events. He denies knowledge of any exacerbating activities.He does have a history of alcohol abuse. He was evaluated for ongoing dizziness in the emergency department today. He was found to have right-sided pneumonia, evidence of alcohol withdrawal, and left rib fractures 5 through 7. He is being admitted to the medical service and we are asked to consult regarding rib fractures. The patient has received Ativan as part of the CIWA protocol and is a poor historian.  Past Medical History  Diagnosis Date  . Hypertension   . Hyperlipemia   . Emphysema   . Stroke   . Chronic pain   . Cirrhosis of liver   . Fall   . Dental injury   . Bone spur of other site     Left Foot  . Impaired mobility   . Total self care deficit     No past surgical history on file.  No family history on file.  Social History:  reports that he has been smoking.  He does not have any smokeless tobacco history on file. He reports that he drinks alcohol. His drug history not on file.  Allergies:  Allergies  Allergen Reactions  . Poison Ivy Extract (Extract Of Poison Ivy) Rash    Medications: I have reviewed the patient's current medications.  Results for orders placed during the hospital encounter of 01/03/12 (from the past 48 hour(s))  OCCULT BLOOD, POC DEVICE     Status: Normal   Collection Time   01/03/12  8:49 AM      Component Value Range Comment   Fecal Occult Bld POSITIVE     TROPONIN I     Status: Normal   Collection Time   01/03/12  8:51 AM      Component Value Range Comment   Troponin I <0.30  <0.30 ng/mL   CBC WITH DIFFERENTIAL     Status: Abnormal   Collection Time   01/03/12  9:05 AM      Component Value Range Comment   WBC 9.8  4.0 - 10.5 K/uL    RBC 4.08  (*) 4.22 - 5.81 MIL/uL    Hemoglobin 10.9 (*) 13.0 - 17.0 g/dL    HCT 09.6 (*) 04.5 - 52.0 %    MCV 79.2  78.0 - 100.0 fL    MCH 26.7  26.0 - 34.0 pg    MCHC 33.7  30.0 - 36.0 g/dL    RDW 40.9 (*) 81.1 - 15.5 %    Platelets 203  150 - 400 K/uL    Neutrophils Relative 86 (*) 43 - 77 %    Neutro Abs 8.5 (*) 1.7 - 7.7 K/uL    Lymphocytes Relative 3 (*) 12 - 46 %    Lymphs Abs 0.3 (*) 0.7 - 4.0 K/uL    Monocytes Relative 10  3 - 12 %    Monocytes Absolute 1.0  0.1 - 1.0 K/uL    Eosinophils Relative 0  0 - 5 %    Eosinophils Absolute 0.0  0.0 - 0.7 K/uL    Basophils Relative 0  0 - 1 %    Basophils Absolute 0.0  0.0 - 0.1 K/uL   TYPE AND SCREEN     Status: Normal   Collection Time   01/03/12  9:05 AM      Component Value Range Comment   ABO/RH(D) O POS      Antibody Screen NEG      Sample Expiration 01/06/2012     HEPATIC FUNCTION PANEL     Status: Abnormal   Collection Time   01/03/12  9:05 AM      Component Value Range Comment   Total Protein 8.4 (*) 6.0 - 8.3 g/dL    Albumin 3.6  3.5 - 5.2 g/dL    AST 161 (*) 0 - 37 U/L    ALT 50  0 - 53 U/L    Alkaline Phosphatase 122 (*) 39 - 117 U/L    Total Bilirubin 0.3  0.3 - 1.2 mg/dL    Bilirubin, Direct 0.1  0.0 - 0.3 mg/dL    Indirect Bilirubin 0.2 (*) 0.3 - 0.9 mg/dL   ETHANOL     Status: Abnormal   Collection Time   01/03/12  9:05 AM      Component Value Range Comment   Alcohol, Ethyl (B) 103 (*) 0 - 11 mg/dL   LIPASE, BLOOD     Status: Abnormal   Collection Time   01/03/12  9:05 AM      Component Value Range Comment   Lipase 77 (*) 11 - 59 U/L   ABO/RH     Status: Normal   Collection Time   01/03/12  9:05 AM      Component Value Range Comment   ABO/RH(D) O POS     LACTIC ACID, PLASMA     Status: Abnormal   Collection Time   01/03/12  9:10 AM      Component Value Range Comment   Lactic Acid, Venous 3.8 (*) 0.5 - 2.2 mmol/L   POCT I-STAT, CHEM 8     Status: Abnormal   Collection Time   01/03/12  9:23 AM      Component  Value Range Comment   Sodium 141  135 - 145 mEq/L    Potassium 3.6  3.5 - 5.1 mEq/L    Chloride 104  96 - 112 mEq/L    BUN <3 (*) 6 - 23 mg/dL    Creatinine, Ser 0.96  0.50 - 1.35 mg/dL    Glucose, Bld 045 (*) 70 - 99 mg/dL    Calcium, Ion 4.09 (*) 1.12 - 1.32 mmol/L    TCO2 22  0 - 100 mmol/L    Hemoglobin 12.2 (*) 13.0 - 17.0 g/dL    HCT 81.1 (*) 91.4 - 52.0 %   PROTIME-INR     Status: Normal   Collection Time   01/03/12  9:25 AM      Component Value Range Comment   Prothrombin Time 13.3  11.6 - 15.2 seconds    INR 0.99  0.00 - 1.49   URINALYSIS, ROUTINE W REFLEX MICROSCOPIC     Status: Normal   Collection Time   01/03/12 10:38 AM      Component Value Range Comment   Color, Urine YELLOW  YELLOW    APPearance CLEAR  CLEAR    Specific Gravity, Urine 1.015  1.005 - 1.030    pH 5.5  5.0 - 8.0    Glucose, UA NEGATIVE  NEGATIVE mg/dL    Hgb urine dipstick NEGATIVE  NEGATIVE    Bilirubin Urine NEGATIVE  NEGATIVE    Ketones, ur NEGATIVE  NEGATIVE mg/dL    Protein, ur NEGATIVE  NEGATIVE mg/dL    Urobilinogen, UA 1.0  0.0 -  1.0 mg/dL    Nitrite NEGATIVE  NEGATIVE    Leukocytes, UA NEGATIVE  NEGATIVE MICROSCOPIC NOT DONE ON URINES WITH NEGATIVE PROTEIN, BLOOD, LEUKOCYTES, NITRITE, OR GLUCOSE <1000 mg/dL.  RETICULOCYTES     Status: Abnormal   Collection Time   01/03/12  3:29 PM      Component Value Range Comment   Retic Ct Pct 1.3  0.4 - 3.1 %    RBC. 3.45 (*) 4.22 - 5.81 MIL/uL    Retic Count, Manual 44.9  19.0 - 186.0 K/uL     Dg Ribs Unilateral Left  01/03/2012  *RADIOLOGY REPORT*  Clinical Data: Fall, left chest pain  LEFT RIBS - 2 VIEW  Comparison: None.  Findings: Right lower lobe opacity, incomplete visualized, as described on prior chest radiographs.  Minimally displaced left lateral 5th, 6th, and possibly 7th rib fractures.  IMPRESSION: Minimally displaced left lateral 5th, 6th, and possibly 7th rib fractures.  Original Report Authenticated By: Peter [name], M.D.   Ct  Head Wo Contrast  01/03/2012  *RADIOLOGY REPORT*  Clinical Data: Multiple falls.  CT HEAD WITHOUT CONTRAST  Technique:  Contiguous axial images were obtained from the base of the skull through the vertex without contrast.  Comparison: None.  Findings: There is diffuse patchy low density throughout the subcortical and periventricular white matter consistent with chronic small vessel ischemic change.  There is prominence of the sulci and ventricles consistent with brain atrophy .  There is no evidence for acute brain infarct, hemorrhage or mass.  There is no evidence for acute brain infarct, hemorrhage or mass.  There is mild mucosal thickening involving the ethmoid air cells and maxillary sinuses.  The mastoid air cells are clear.  The skull is intact.  IMPRESSION:  1.  No acute intracranial abnormalities.  Original Report Authenticated By: Rosealee Albee, M.D.   Ct Abdomen Pelvis W Contrast  01/03/2012  *RADIOLOGY REPORT*  Clinical Data: Dizziness.  History of severe repeated alcohol intoxication.  Multiple falls and syncopal events in recent days. History of trauma from a fall off of a porch.  CT ABDOMEN AND PELVIS WITH CONTRAST  Technique:  Multidetector CT imaging of the abdomen and pelvis was performed following the standard protocol during bolus administration of intravenous contrast.  Contrast: OMNIPAQUE IOHEXOL 300 MG/ML  SOLN  Comparison: No priors.  Findings:  Lung Bases: Extensive air space consolidation in the right middle lobe with multiple air bronchograms.  Patchy ground-glass attenuation and air space consolidation is present to a much lesser extent within the right lower lobe as well.  Abdomen/Pelvis:  Multiple low attenuation lesions scattered throughout the hepatic parenchyma, measuring up to 9 mm in diameter, all of which are too small to definitively characterize. There is some high attenuation material layering dependently in the lumen of the gallbladder, which likely represents  biliary sludge. No gallstones or findings to suggest acute cholecystitis at this time.  The enhanced appearance of the pancreas, spleen, bilateral adrenal glands and bilateral kidneys is unremarkable.  Normal appendix.  Atherosclerosis in the abdominal and pelvic vasculature, without evidence of aneurysm or dissection.  There is no ascites or pneumoperitoneum and no pathologic distension of bowel.  No definite pathologic lymphadenopathy identified within the abdomen or pelvis.  The colon is nearly completely decompressed which results in apparent diffuse colonic wall thickening.  This is favored to be secondary to under distension, rather than an underlying pathologic state.  Prostate and urinary bladder are unremarkable in appearance.  Musculoskeletal: There  are no aggressive appearing lytic or blastic lesions noted in the visualized portions of the skeleton.  IMPRESSION: 1.  Extensive airspace consolidation in the right middle lobe, and to a lesser extent in the right lower lobe, concerning for pneumonia.  Given the patient's history, this may represent sequelae of aspiration. There is also a small right-sided pleural effusion layering dependently. 2. Biliary sludge within the gallbladder, without evidence of acute cholecystitis. 3.  Numerous low attenuation lesions scattered throughout the hepatic parenchyma are too small to definitively characterize. These are statistically favored to represent small cysts or biliary hamartomas.  This would require MRI for further characterization if clinically indicated. 4.  Normal appendix. 5.  Atherosclerosis.  Original Report Authenticated By: Florencia Reasons, M.D.   Dg Chest Portable 1 View  01/03/2012  *RADIOLOGY REPORT*  Clinical Data: Dizziness  PORTABLE CHEST - 1 VIEW  Comparison: 02/12/2011  Findings: The heart size is normal.  No pleural effusion or edema. Dense airspace consolidation within the right lower lobe is identified, new from previous exam.  There are  no displaced rib fractures identified.  IMPRESSION:  1.  Dense airspace consolidation in the right base.  In the acute setting this is most likely due to pneumonia. If there is a history of trauma and no symptoms of pneumonia then this could represent an area of pulmonary contusion.  If the patient is asymptomatic then these findings would be concerning for a pulmonary mass.  Careful clinical correlation and close interval radiographic followup is advised.  Original Report Authenticated By: Rosealee Albee, M.D.   Dg Foot Complete Right  01/03/2012  *RADIOLOGY REPORT*  Clinical Data: Fall, foot pain  RIGHT FOOT COMPLETE - 3+ VIEW  Comparison: None.  Findings: No fracture or dislocation is seen.  The joint spaces are preserved.  The visualized soft tissues are unremarkable.  IMPRESSION: No fracture or dislocation is seen.  Original Report Authenticated By: Peter [name], M.D.    Review of Systems  Unable to perform ROS: mental status change   Blood pressure 122/72, pulse 106, temperature 100.4 F (38 C), temperature source Oral, resp. rate 20, SpO2 100.00%. Physical Exam  Constitutional: He appears well-developed. He appears lethargic. No distress.  HENT:  Head: Normocephalic and atraumatic.       Lips and oropharynx dry  Eyes: EOM are normal. Pupils are equal, round, and reactive to light. No scleral icterus.  Neck: Normal range of motion. Neck supple. No tracheal deviation present.       No posterior midline tenderness  Cardiovascular: Normal rate, regular rhythm and normal heart sounds.   Respiratory: Effort normal. No stridor. He has no wheezes. He has no rales. He exhibits tenderness.       Few rhonchi right greater than left, left-sided chest wall tenderness  GI: Soft. Bowel sounds are normal. He exhibits no distension. There is no tenderness. There is no rebound and no guarding.  Genitourinary: Penis normal.       Condom catheter in place  Musculoskeletal:       Somewhat decreased  range of motion left upper lower extremity, please see neurologic exam  Lymphadenopathy:    He has no cervical adenopathy.  Neurological: He appears lethargic. No sensory deficit. GCS eye subscore is 4. GCS verbal subscore is 5. GCS motor subscore is 6.       Strength left upper and lower extremities 4/5 Strength right upper and lower extremity 5 out of 5 Tremor present in left upper and lower  extremity  Skin: Skin is warm and dry. He is not diaphoretic.    Assessment/Plan: Patient with right sided pneumonia, alcohol abuse, multiple recent falls, syncope, and left-sided rib fractures 5 through 7. Agree with medical admission and treatment including syncope workup. I recommend pulmonary toilet, bronchodilators, and pain medication for his rib fractures. We will plan to follow along with you. I discussed the plan with the patient.  Kalliope Riesen E 01/03/2012, 4:29 PM

## 2012-01-03 NOTE — ED Notes (Signed)
MD at bedside. Family updated on plan of care. Uneventful CT exam.  VSS.

## 2012-01-03 NOTE — ED Provider Notes (Signed)
Medical screening examination/treatment/procedure(s) were conducted as a shared visit with non-physician practitioner(s) and myself.  I personally evaluated the patient during the encounter  Doug Sou, MD 01/03/12 1626

## 2012-01-03 NOTE — ED Notes (Signed)
Caregiver called b/c blood in emesis, but pt. Denies it. Been dizzy x 1 month. ETOH and poor fluid intake; multiple falls x 1 mos. ECG ST, orthostatics. Sob.

## 2012-01-03 NOTE — ED Provider Notes (Signed)
History     CSN: [id number]  Arrival date & time 01/03/12  1191   First MD Initiated Contact with Patient 01/03/12 0830      No chief complaint on file.   (Consider location/radiation/quality/duration/timing/severity/associated sxs/prior treatment) HPI  61 year old male with a long-standing history of alcohol abuse presents due to L ribs pain, persistent falls due to being dizziness/lightheadedness and alchol abuse.  History was obtained through patient and family members.  Pt reports being a chronic drinker who drinks unspecified amount each day. Last drink was last night. Family endorse poor fluid intake but alcohol abuse. For the past month he notice feeling very dizzy with standing/walking and has had multiple falls. His last fall was 2 days ago when he fell off a porch and injured his left ribs. He endorsed sharp pain especially with breathing all with palpation to his left ribs. Reports injuring his right foot from the fall.  He also endorsed a cough productive with yellow sputum for the past week and increased shortness of breath. He also endorsed nausea and vomits after drinking. Last vomit was this morning with trace of brown coffee ground blood in emesis. EMS found the patient had positive orthostatic and was tachycardic with a sinus rhythm prior to arrival.  Patient also has history of prior stroke that affected his right side. He he walks with a cane. Patient also has a history of cirrhosis of liver secondary to his alcohol abuse.  No past medical history on file.  No past surgical history on file.  No family history on file.  History  Substance Use Topics  . Smoking status: Not on file  . Smokeless tobacco: Not on file  . Alcohol Use: Not on file      Review of Systems  All other systems reviewed and are negative.    Allergies  Review of patient's allergies indicates not on file.  Home Medications  No current outpatient prescriptions on file.  There were no  vitals taken for this visit.  Physical Exam  Nursing note and vitals reviewed. Constitutional: He appears well-developed and well-nourished.       Appears tremulous, ill appearing.  HENT:  Head: Normocephalic and atraumatic.  Mouth/Throat: No oropharyngeal exudate.       Poor dentition  Eyes: Conjunctivae and EOM are normal. Pupils are equal, round, and reactive to light.  Neck: Normal range of motion. Neck supple.  Cardiovascular:       Tachycardia without obvious murmurs/rubs/gallop  Pulmonary/Chest: He has wheezes. He has rales. He exhibits tenderness.       Left lower anterior ribs with tenderness on palpation without crepitus, or emphysema noted. Lung with scattered wheezes and rhonchi with decreased breath sounds to lung base left greater than right.  Abdominal: Soft. There is no tenderness.  Genitourinary: Rectal exam shows no tenderness. Guaiac positive stool.       Rectal exam reveals no gross blood  Musculoskeletal:       4/5 strength to right upper and lower extremities as compared to normal strength to left upper and lower extremities.  R foot with ecchymosis to dorsum of foot, point tenderness to midfoot.  Brisk cap refills.  R ankle with NROM.  Pedall pulse 2+.    Neurological: He is alert. He displays tremor. No cranial nerve deficit or sensory deficit. GCS eye subscore is 3. GCS verbal subscore is 4. GCS motor subscore is 6.    ED Course  Procedures (including critical care time)  Labs Reviewed -  No data to display No results found.   No diagnosis found.   Date: 01/03/2012  Rate: 134  Rhythm: Sinus tach  QRS Axis: normal  Intervals: normal  ST/T Wave abnormalities: normal  Conduction Disutrbances: none  Narrative Interpretation:   Old EKG Reviewed: no prior ECG for comparison   Results for orders placed during the hospital encounter of 01/03/12  OCCULT BLOOD, POC DEVICE      Component Value Range   Fecal Occult Bld POSITIVE    CBC WITH DIFFERENTIAL       Component Value Range   WBC 9.8  4.0 - 10.5 K/uL   RBC 4.08 (*) 4.22 - 5.81 MIL/uL   Hemoglobin 10.9 (*) 13.0 - 17.0 g/dL   HCT 16.1 (*) 09.6 - 04.5 %   MCV 79.2  78.0 - 100.0 fL   MCH 26.7  26.0 - 34.0 pg   MCHC 33.7  30.0 - 36.0 g/dL   RDW 40.9 (*) 81.1 - 91.4 %   Platelets 203  150 - 400 K/uL   Neutrophils Relative 86 (*) 43 - 77 %   Neutro Abs 8.5 (*) 1.7 - 7.7 K/uL   Lymphocytes Relative 3 (*) 12 - 46 %   Lymphs Abs 0.3 (*) 0.7 - 4.0 K/uL   Monocytes Relative 10  3 - 12 %   Monocytes Absolute 1.0  0.1 - 1.0 K/uL   Eosinophils Relative 0  0 - 5 %   Eosinophils Absolute 0.0  0.0 - 0.7 K/uL   Basophils Relative 0  0 - 1 %   Basophils Absolute 0.0  0.0 - 0.1 K/uL  TYPE AND SCREEN      Component Value Range   ABO/RH(D) O POS     Antibody Screen NEG     Sample Expiration 01/06/2012    HEPATIC FUNCTION PANEL      Component Value Range   Total Protein 8.4 (*) 6.0 - 8.3 g/dL   Albumin 3.6  3.5 - 5.2 g/dL   AST 782 (*) 0 - 37 U/L   ALT 50  0 - 53 U/L   Alkaline Phosphatase 122 (*) 39 - 117 U/L   Total Bilirubin 0.3  0.3 - 1.2 mg/dL   Bilirubin, Direct 0.1  0.0 - 0.3 mg/dL   Indirect Bilirubin 0.2 (*) 0.3 - 0.9 mg/dL  URINALYSIS, ROUTINE W REFLEX MICROSCOPIC      Component Value Range   Color, Urine YELLOW  YELLOW   APPearance CLEAR  CLEAR   Specific Gravity, Urine 1.015  1.005 - 1.030   pH 5.5  5.0 - 8.0   Glucose, UA NEGATIVE  NEGATIVE mg/dL   Hgb urine dipstick NEGATIVE  NEGATIVE   Bilirubin Urine NEGATIVE  NEGATIVE   Ketones, ur NEGATIVE  NEGATIVE mg/dL   Protein, ur NEGATIVE  NEGATIVE mg/dL   Urobilinogen, UA 1.0  0.0 - 1.0 mg/dL   Nitrite NEGATIVE  NEGATIVE   Leukocytes, UA NEGATIVE  NEGATIVE  ETHANOL      Component Value Range   Alcohol, Ethyl (B) 103 (*) 0 - 11 mg/dL  LIPASE, BLOOD      Component Value Range   Lipase 77 (*) 11 - 59 U/L  TROPONIN I      Component Value Range   Troponin I <0.30  <0.30 ng/mL  LACTIC ACID, PLASMA      Component Value Range    Lactic Acid, Venous 3.8 (*) 0.5 - 2.2 mmol/L  PROTIME-INR      Component  Value Range   Prothrombin Time 13.3  11.6 - 15.2 seconds   INR 0.99  0.00 - 1.49  POCT I-STAT, CHEM 8      Component Value Range   Sodium 141  135 - 145 mEq/L   Potassium 3.6  3.5 - 5.1 mEq/L   Chloride 104  96 - 112 mEq/L   BUN <3 (*) 6 - 23 mg/dL   Creatinine, Ser 4.09  0.50 - 1.35 mg/dL   Glucose, Bld 811 (*) 70 - 99 mg/dL   Calcium, Ion 9.14 (*) 1.12 - 1.32 mmol/L   TCO2 22  0 - 100 mmol/L   Hemoglobin 12.2 (*) 13.0 - 17.0 g/dL   HCT 78.2 (*) 95.6 - 21.3 %  ABO/RH      Component Value Range   ABO/RH(D) O POS     Dg Ribs Unilateral Left  01/03/2012  *RADIOLOGY REPORT*  Clinical Data: Fall, left chest pain  LEFT RIBS - 2 VIEW  Comparison: None.  Findings: Right lower lobe opacity, incomplete visualized, as described on prior chest radiographs.  Minimally displaced left lateral 5th, 6th, and possibly 7th rib fractures.  IMPRESSION: Minimally displaced left lateral 5th, 6th, and possibly 7th rib fractures.  Original Report Authenticated By: Charline Bills, M.D.   Ct Head Wo Contrast  01/03/2012  *RADIOLOGY REPORT*  Clinical Data: Multiple falls.  CT HEAD WITHOUT CONTRAST  Technique:  Contiguous axial images were obtained from the base of the skull through the vertex without contrast.  Comparison: None.  Findings: There is diffuse patchy low density throughout the subcortical and periventricular white matter consistent with chronic small vessel ischemic change.  There is prominence of the sulci and ventricles consistent with brain atrophy .  There is no evidence for acute brain infarct, hemorrhage or mass.  There is no evidence for acute brain infarct, hemorrhage or mass.  There is mild mucosal thickening involving the ethmoid air cells and maxillary sinuses.  The mastoid air cells are clear.  The skull is intact.  IMPRESSION:  1.  No acute intracranial abnormalities.  Original Report Authenticated By: Rosealee Albee, M.D.   Ct Abdomen Pelvis W Contrast  01/03/2012  *RADIOLOGY REPORT*  Clinical Data: Dizziness.  History of severe repeated alcohol intoxication.  Multiple falls and syncopal events in recent days. History of trauma from a fall off of a porch.  CT ABDOMEN AND PELVIS WITH CONTRAST  Technique:  Multidetector CT imaging of the abdomen and pelvis was performed following the standard protocol during bolus administration of intravenous contrast.  Contrast: OMNIPAQUE IOHEXOL 300 MG/ML  SOLN  Comparison: No priors.  Findings:  Lung Bases: Extensive air space consolidation in the right middle lobe with multiple air bronchograms.  Patchy ground-glass attenuation and air space consolidation is present to a much lesser extent within the right lower lobe as well.  Abdomen/Pelvis:  Multiple low attenuation lesions scattered throughout the hepatic parenchyma, measuring up to 9 mm in diameter, all of which are too small to definitively characterize. There is some high attenuation material layering dependently in the lumen of the gallbladder, which likely represents biliary sludge. No gallstones or findings to suggest acute cholecystitis at this time.  The enhanced appearance of the pancreas, spleen, bilateral adrenal glands and bilateral kidneys is unremarkable.  Normal appendix.  Atherosclerosis in the abdominal and pelvic vasculature, without evidence of aneurysm or dissection.  There is no ascites or pneumoperitoneum and no pathologic distension of bowel.  No definite pathologic lymphadenopathy identified  within the abdomen or pelvis.  The colon is nearly completely decompressed which results in apparent diffuse colonic wall thickening.  This is favored to be secondary to under distension, rather than an underlying pathologic state.  Prostate and urinary bladder are unremarkable in appearance.  Musculoskeletal: There are no aggressive appearing lytic or blastic lesions noted in the visualized portions of the  skeleton.  IMPRESSION: 1.  Extensive airspace consolidation in the right middle lobe, and to a lesser extent in the right lower lobe, concerning for pneumonia.  Given the patient's history, this may represent sequelae of aspiration. There is also a small right-sided pleural effusion layering dependently. 2. Biliary sludge within the gallbladder, without evidence of acute cholecystitis. 3.  Numerous low attenuation lesions scattered throughout the hepatic parenchyma are too small to definitively characterize. These are statistically favored to represent small cysts or biliary hamartomas.  This would require MRI for further characterization if clinically indicated. 4.  Normal appendix. 5.  Atherosclerosis.  Original Report Authenticated By: Florencia Reasons, M.D.   Dg Chest Portable 1 View  01/03/2012  *RADIOLOGY REPORT*  Clinical Data: Dizziness  PORTABLE CHEST - 1 VIEW  Comparison: 02/12/2011  Findings: The heart size is normal.  No pleural effusion or edema. Dense airspace consolidation within the right lower lobe is identified, new from previous exam.  There are no displaced rib fractures identified.  IMPRESSION:  1.  Dense airspace consolidation in the right base.  In the acute setting this is most likely due to pneumonia. If there is a history of trauma and no symptoms of pneumonia then this could represent an area of pulmonary contusion.  If the patient is asymptomatic then these findings would be concerning for a pulmonary mass.  Careful clinical correlation and close interval radiographic followup is advised.  Original Report Authenticated By: Rosealee Albee, M.D.   Dg Foot Complete Right  01/03/2012  *RADIOLOGY REPORT*  Clinical Data: Fall, foot pain  RIGHT FOOT COMPLETE - 3+ VIEW  Comparison: None.  Findings: No fracture or dislocation is seen.  The joint spaces are preserved.  The visualized soft tissues are unremarkable.  IMPRESSION: No fracture or dislocation is seen.  Original Report  Authenticated By: Charline Bills, M.D.   CRITICAL CARE Performed by: Fayrene Helper   Total critical care time: 30 min  Critical care time was exclusive of separately billable procedures and treating other patients.  Critical care was necessary to treat or prevent imminent or life-threatening deterioration.  Critical care was time spent personally by me on the following activities: development of treatment plan with patient and/or surrogate as well as nursing, discussions with consultants, evaluation of patient's response to treatment, examination of patient, obtaining history from patient or surrogate, ordering and performing treatments and interventions, ordering and review of laboratory studies, ordering and review of radiographic studies, pulse oximetry and re-evaluation of patient's condition.   1.community acquired pneumonia 2. Sepsis 3. Orthostatic hypotension 4. ribs fractures 5. Alcohol withdrawal     MDM  Pt with hx of alcohol abuse presents with positive orthostatic hypotension.  Hemoccult positive, vs shows tachycardia and hypotension.  Also endorse cough, has fever of 101.  Tylenol given, cxr ordered. Rib and R foot pain 2/2 recent fall, xray ordered.  Work up initiated.  Pt will need admission.  My attending has seen and evaluated pt.      10:23 AM Chest x-ray shows a dense airspace consolidation in the right base concerning for acute pneumonia. This is consistent with  his presenting symptoms of fever, and cough. Although has a normal white count his lactic acid is 3.8.  He has hemoglobin of 10.9 which is near his baseline.  AST and alkaline phosphatase has been elevated. Lipase is mildly elevated at 77. Head CT is negative  11:28 AM X-ray of left ribs shows minimally displaced left lateral fifth, sixth, and possibly seventh rib fracture, which accounts for patient's presenting pain. My attending will consult with trauma for further management.  11:36 AM I have called  unassigned service [facility]) who agrees to see patient in ED for further evaluation.  My attending also consulted Dr. Janee Morn from surgery who will see pt in ED for further care.  CIWA protocol initiated.    1:46 PM Pt does have a primary care doctor, Dr. Mertha Finders.  Pt will be admitted to [facility], Team 6, Dr. Rito Ehrlich, Step down bed.    Fayrene Helper, PA-C 01/03/12 1347

## 2012-01-03 NOTE — Progress Notes (Signed)
01/03/12 1416  OTHER  CSW Follow Up Status Follow-up required    Unit based LCSW to be consulted for ETOh/SBIRT Assessment and disposition plans to contract for safety.  Dionne Milo MSW [facility] Emergency Dept. Weekend/Social Worker [phone]

## 2012-01-03 NOTE — H&P (Signed)
[facility] History and Physical  Peter [name] YNW:[id number] DOB: 04-03-1951 DOA: 01/03/2012  Referring physician:  PCP: No primary provider on file.   Chief Complaint: brought by ambulance for nausea, vomiting and fall.  HPI:  61 year old gentleman brought by his cousin, for multiple falls over the last few weeks, nausea, vomiting today. He is a poor historian and he is currently very drowsy from the ativan given for tremors. Most of the history was obtained from the family at bedside. He is a avid alcohol user, has a h/o cirrhosis of liver. He fell in the first week of June and was seen by his PCP and was given clinda for an dental injury and infection. He has ongoing diarrhea,. He has tremors since many weeks. He has cough with productive yellow colored sputum. D/o dizziness and possibly syncopal episodes in the past. On arrival to ED he was found to have rib fractures, possibly aspiration pneumonia, alcohol in blood, . He will be admitted to SDU for further management.   Review of Systems:  Couldn't be obtained.  Past Medical History  Diagnosis Date  . Hypertension   . Hyperlipemia   . Emphysema   . Stroke   . Chronic pain   . Cirrhosis of liver   . Fall   . Dental injury   . Bone spur of other site     Left Foot  . Impaired mobility   . Total self care deficit    No past surgical history on file. Social History:  reports that he has been smoking.  He does not have any smokeless tobacco history on file. He reports that he drinks alcohol. His drug history not on file.  Allergies  Allergen Reactions  . Poison Ivy Extract (Extract Of Poison Ivy) Rash    No family history on file.  Prior to Admission medications   Medication Sig Start Date End Date Taking? Authorizing Provider  albuterol (PROVENTIL HFA;VENTOLIN HFA) 108 (90 BASE) MCG/ACT inhaler Inhale 2 puffs into the lungs every 6 (six) hours as needed. Shortness of breath   Yes Historical Provider, MD  aspirin EC  81 MG tablet Take 81 mg by mouth daily.   Yes Historical Provider, MD  clindamycin (CLEOCIN) 150 MG capsule Take 150 mg by mouth 4 (four) times daily.   Yes Historical Provider, MD  ferrous sulfate 325 (65 FE) MG tablet Take 325 mg by mouth daily with breakfast.   Yes Historical Provider, MD  folic acid (FOLVITE) 1 MG tablet Take 1 mg by mouth daily.   Yes Historical Provider, MD  HYDROcodone-acetaminophen (LORTAB) 7.5-500 MG per tablet Take 1 tablet by mouth every 6 (six) hours as needed. For pain   Yes Historical Provider, MD  pravastatin (PRAVACHOL) 40 MG tablet Take 40 mg by mouth daily.   Yes Historical Provider, MD  thiamine (VITAMIN B-1) 100 MG tablet Take 100 mg by mouth daily.   Yes Historical Provider, MD  traMADol (ULTRAM) 50 MG tablet Take 50 mg by mouth 2 (two) times daily as needed. For pain   Yes Historical Provider, MD  vitamin B-12 (CYANOCOBALAMIN) 100 MCG tablet Take 100 mcg by mouth daily.   Yes Historical Provider, MD  vitamin E (VITAMIN E) 200 UNIT capsule Take 200 Units by mouth daily.   Yes Historical Provider, MD   Physical Exam: Filed Vitals:   01/03/12 1000 01/03/12 1015 01/03/12 1120 01/03/12 1443  BP: 148/93 126/78 138/89 129/74  Pulse: 109 106 100   Temp:  100.5 F (38.1 C) 100.4 F (38 C)  TempSrc:   Oral Oral  Resp: 22 22  20   SpO2: 100% 100% 100% 100%   Constitutional: Vital signs reviewed.  Patient is a well-developed and poorly nourished in no acute distress and cooperative with exam. drowsy,   Head: Normocephalic and atraumatic Mouth: no erythema or exudates, dry MM Eyes: PERRL, EOMI, conjunctivae normal, No scleral icterus.  Neck: Supple, Trachea midline normal ROM, No JVD, mass, thyromegaly, or carotid bruit present.  Cardiovascular: tachycardic, S1 normal, S2 normal, no MRG, pulses symmetric and intact bilaterally Pulmonary/Chest: decreased air entry at right base. Scattered rhonchi and wheezing. Abdominal: Soft. Non-tender, non-distended, bowel  sounds are normal, no masses, organomegaly, or guarding present.  Musculoskeletal: No joint deformities, erythema, or stiffness, ROM full and no nontender Neurological:altered mental status. , Strenght is normal and symmetric bilaterally, cranial nerve II-XII are grossly intact, no focal motor deficit, Skin: Warm, dry and intact. No rash, cyanosis, or clubbing.  Psychiatric: Normal mood and affect.   Labs on Admission:  Basic Metabolic Panel:  Lab 01/03/12 1610  NA 141  K 3.6  CL 104  CO2 --  GLUCOSE 116*  BUN <3*  CREATININE 0.90  CALCIUM --  MG --  PHOS --   Liver Function Tests:  Lab 01/03/12 0905  AST 106*  ALT 50  ALKPHOS 122*  BILITOT 0.3  PROT 8.4*  ALBUMIN 3.6    Lab 01/03/12 0905  LIPASE 77*  AMYLASE --   No results found for this basename: AMMONIA:5 in the last 168 hours CBC:  Lab 01/03/12 0923 01/03/12 0905  WBC -- 9.8  NEUTROABS -- 8.5*  HGB 12.2* 10.9*  HCT 36.0* 32.3*  MCV -- 79.2  PLT -- 203   Cardiac Enzymes:  Lab 01/03/12 0851  CKTOTAL --  CKMB --  CKMBINDEX --  TROPONINI <0.30   BNP: No components found with this basename: POCBNP:5 CBG: No results found for this basename: GLUCAP:5 in the last 168 hours  Radiological Exams on Admission: Dg Ribs Unilateral Left  01/03/2012  *RADIOLOGY REPORT*  Clinical Data: Fall, left chest pain  LEFT RIBS - 2 VIEW  Comparison: None.  Findings: Right lower lobe opacity, incomplete visualized, as described on prior chest radiographs.  Minimally displaced left lateral 5th, 6th, and possibly 7th rib fractures.  IMPRESSION: Minimally displaced left lateral 5th, 6th, and possibly 7th rib fractures.  Original Report Authenticated By: Charline Bills, M.D.   Ct Head Wo Contrast  01/03/2012  *RADIOLOGY REPORT*  Clinical Data: Multiple falls.  CT HEAD WITHOUT CONTRAST  Technique:  Contiguous axial images were obtained from the base of the skull through the vertex without contrast.  Comparison: None.  Findings:  There is diffuse patchy low density throughout the subcortical and periventricular white matter consistent with chronic small vessel ischemic change.  There is prominence of the sulci and ventricles consistent with brain atrophy .  There is no evidence for acute brain infarct, hemorrhage or mass.  There is no evidence for acute brain infarct, hemorrhage or mass.  There is mild mucosal thickening involving the ethmoid air cells and maxillary sinuses.  The mastoid air cells are clear.  The skull is intact.  IMPRESSION:  1.  No acute intracranial abnormalities.  Original Report Authenticated By: Rosealee Albee, M.D.   Ct Abdomen Pelvis W Contrast  01/03/2012  *RADIOLOGY REPORT*  Clinical Data: Dizziness.  History of severe repeated alcohol intoxication.  Multiple falls and syncopal events in recent  days. History of trauma from a fall off of a porch.  CT ABDOMEN AND PELVIS WITH CONTRAST  Technique:  Multidetector CT imaging of the abdomen and pelvis was performed following the standard protocol during bolus administration of intravenous contrast.  Contrast: OMNIPAQUE IOHEXOL 300 MG/ML  SOLN  Comparison: No priors.  Findings:  Lung Bases: Extensive air space consolidation in the right middle lobe with multiple air bronchograms.  Patchy ground-glass attenuation and air space consolidation is present to a much lesser extent within the right lower lobe as well.  Abdomen/Pelvis:  Multiple low attenuation lesions scattered throughout the hepatic parenchyma, measuring up to 9 mm in diameter, all of which are too small to definitively characterize. There is some high attenuation material layering dependently in the lumen of the gallbladder, which likely represents biliary sludge. No gallstones or findings to suggest acute cholecystitis at this time.  The enhanced appearance of the pancreas, spleen, bilateral adrenal glands and bilateral kidneys is unremarkable.  Normal appendix.  Atherosclerosis in the abdominal and  pelvic vasculature, without evidence of aneurysm or dissection.  There is no ascites or pneumoperitoneum and no pathologic distension of bowel.  No definite pathologic lymphadenopathy identified within the abdomen or pelvis.  The colon is nearly completely decompressed which results in apparent diffuse colonic wall thickening.  This is favored to be secondary to under distension, rather than an underlying pathologic state.  Prostate and urinary bladder are unremarkable in appearance.  Musculoskeletal: There are no aggressive appearing lytic or blastic lesions noted in the visualized portions of the skeleton.  IMPRESSION: 1.  Extensive airspace consolidation in the right middle lobe, and to a lesser extent in the right lower lobe, concerning for pneumonia.  Given the patient's history, this may represent sequelae of aspiration. There is also a small right-sided pleural effusion layering dependently. 2. Biliary sludge within the gallbladder, without evidence of acute cholecystitis. 3.  Numerous low attenuation lesions scattered throughout the hepatic parenchyma are too small to definitively characterize. These are statistically favored to represent small cysts or biliary hamartomas.  This would require MRI for further characterization if clinically indicated. 4.  Normal appendix. 5.  Atherosclerosis.  Original Report Authenticated By: Florencia Reasons, M.D.   Dg Chest Portable 1 View  01/03/2012  *RADIOLOGY REPORT*  Clinical Data: Dizziness  PORTABLE CHEST - 1 VIEW  Comparison: 02/12/2011  Findings: The heart size is normal.  No pleural effusion or edema. Dense airspace consolidation within the right lower lobe is identified, new from previous exam.  There are no displaced rib fractures identified.  IMPRESSION:  1.  Dense airspace consolidation in the right base.  In the acute setting this is most likely due to pneumonia. If there is a history of trauma and no symptoms of pneumonia then this could represent an  area of pulmonary contusion.  If the patient is asymptomatic then these findings would be concerning for a pulmonary mass.  Careful clinical correlation and close interval radiographic followup is advised.  Original Report Authenticated By: Rosealee Albee, M.D.   Dg Foot Complete Right  01/03/2012  *RADIOLOGY REPORT*  Clinical Data: Fall, foot pain  RIGHT FOOT COMPLETE - 3+ VIEW  Comparison: None.  Findings: No fracture or dislocation is seen.  The joint spaces are preserved.  The visualized soft tissues are unremarkable.  IMPRESSION: No fracture or dislocation is seen.  Original Report Authenticated By: Charline Bills, M.D.    EKG: sinus tachy  Assessment/Plan Active Problems:  Alcohol withdrawal  COPD (chronic obstructive pulmonary disease)  CAP (community acquired pneumonia)  1. Aspiration Pneumonia: -started him on zosyn -aspiration precautions -SLP evaluation  -pulmonary toilet -bronchodilators -nasal oxygen as required -sputum cultures -urine for legionella antigen and streptococcal antigen 2.  RIb fractures:  - pain control -incentive spirometry -repeat CXR for evaluation of pulm contusion. - trauma surgery consult  3. Alcohol abuse: - education -ciwa protocol -admit to SDU for monitoring of DT's  - iv fluids with thiamine and folic acid. -CCSW  consult  4. H/o Multiple falls at home:  eval for syncope -on tele for evaluation of arrhythmias -2 d echocardiogram -enzymes  5, Encephalopathic secondary to alcohol intoxication -fall precautions -DT precautions -ammonia level will be obtained - tremors in his hands, not sure if its DT'S vs asterixis.   6. COPD: NEBS AS NEEDED.   7. Diarrhea: - with h/o recent clindamycin use, rule out c diff by PCR.  Stool culture.  Fever/tachycardia: -possibly from pneumonia and dehydration from diarrhea.  guiac positive stools: He has h/o cirrhosis of liver, will need to evaluate for variceal bleeding.  - pt sees Dr  hung as outpatient -will call GI if his hemoglobin drops -Q12H&H PPI.  ANEMIA: anemia panel ordered. Possibly AOCD.  Elevated  Liver enzymes:  Possibly from alcohol abuse and cirrhosis of liver Will repeat them in am.      Code Status: full code Family Communication:family at bedside Disposition Plan: ? rehab  Cayman Kielbasa, MD  [facility] Pager [phone]  If 7PM-7AM, please contact night-coverage [email or web address] Password [email or web address] 01/03/2012, 3:11 PM

## 2012-01-03 NOTE — Progress Notes (Signed)
ANTIBIOTIC CONSULT NOTE - INITIAL  Pharmacy Consult for zosyn Indication: possible aspiration PNA  Allergies  Allergen Reactions  . Poison Ivy Extract (Extract Of Poison Ivy) Rash    Patient Measurements:   Adjusted Body Weight:  Vital Signs: Temp: 100.4 F (38 C) (06/29 1443) Temp src: Oral (06/29 1443) BP: 122/72 mmHg (06/29 1515) Pulse Rate: 106  (06/29 1515) Intake/Output from previous day:   Intake/Output from this shift:    Labs:  Basename 01/03/12 0923 01/03/12 0905  WBC -- 9.8  HGB 12.2* 10.9*  PLT -- 203  LABCREA -- --  CREATININE 0.90 --   CrCl is unknown because there is no height on file for the current visit. No results found for this basename: VANCOTROUGH:2,VANCOPEAK:2,VANCORANDOM:2,GENTTROUGH:2,GENTPEAK:2,GENTRANDOM:2,TOBRATROUGH:2,TOBRAPEAK:2,TOBRARND:2,AMIKACINPEAK:2,AMIKACINTROU:2,AMIKACIN:2, in the last 72 hours   Microbiology: No results found for this or any previous visit (from the past 720 hour(s)).  Medical History: Past Medical History  Diagnosis Date  . Hypertension   . Hyperlipemia   . Emphysema   . Stroke   . Chronic pain   . Cirrhosis of liver   . Fall   . Dental injury   . Bone spur of other site     Left Foot  . Impaired mobility   . Total self care deficit     Medications:  Scheduled:    . acetaminophen  650 mg Oral Once  . azithromycin  500 mg Intravenous Once  . cefTRIAXone (ROCEPHIN)  IV  1 g Intravenous Once  . folic acid  1 mg Oral Daily  . LORazepam  0-4 mg Intravenous Q6H   Followed by  . LORazepam  0-4 mg Intravenous Q12H  . LORazepam  2 mg Intravenous Once  . multivitamin with minerals  1 tablet Oral Daily  . sodium chloride  1,000 mL Intravenous Once  . sodium chloride  1,000 mL Intravenous Once  . thiamine  100 mg Oral Daily   Or  . thiamine  100 mg Intravenous Daily  . DISCONTD: folic acid  1 mg Oral Daily  . DISCONTD: LORazepam  1 mg Intravenous Once  . DISCONTD: multivitamin with minerals  1  tablet Oral Daily  . DISCONTD: thiamine  100 mg Intravenous Daily  . DISCONTD: thiamine  100 mg Intravenous Daily  . DISCONTD: thiamine  100 mg Oral Daily   Assessment: 60 YOM given rocephin and azithromycin in ED, now orders for zosyn for aspiration pneumonia d/t alcoholism. He also c/o productive cough and sputum.   Plan:  1. Zosyn 3.375gm IV x 1 in ED then 3.375gm IV q8h with 4h infusion  Dannielle Huh 01/03/2012,3:39 PM

## 2012-01-04 ENCOUNTER — Inpatient Hospital Stay (HOSPITAL_COMMUNITY): Payer: Medicaid Other

## 2012-01-04 DIAGNOSIS — R945 Abnormal results of liver function studies: Secondary | ICD-10-CM

## 2012-01-04 DIAGNOSIS — J690 Pneumonitis due to inhalation of food and vomit: Secondary | ICD-10-CM | POA: Diagnosis present

## 2012-01-04 DIAGNOSIS — D72829 Elevated white blood cell count, unspecified: Secondary | ICD-10-CM | POA: Diagnosis present

## 2012-01-04 DIAGNOSIS — R55 Syncope and collapse: Secondary | ICD-10-CM | POA: Diagnosis present

## 2012-01-04 DIAGNOSIS — S2249XA Multiple fractures of ribs, unspecified side, initial encounter for closed fracture: Secondary | ICD-10-CM | POA: Diagnosis present

## 2012-01-04 DIAGNOSIS — R748 Abnormal levels of other serum enzymes: Secondary | ICD-10-CM | POA: Diagnosis present

## 2012-01-04 DIAGNOSIS — D649 Anemia, unspecified: Secondary | ICD-10-CM | POA: Diagnosis present

## 2012-01-04 DIAGNOSIS — E785 Hyperlipidemia, unspecified: Secondary | ICD-10-CM

## 2012-01-04 DIAGNOSIS — F1010 Alcohol abuse, uncomplicated: Secondary | ICD-10-CM

## 2012-01-04 DIAGNOSIS — R197 Diarrhea, unspecified: Secondary | ICD-10-CM

## 2012-01-04 DIAGNOSIS — E876 Hypokalemia: Secondary | ICD-10-CM | POA: Diagnosis present

## 2012-01-04 LAB — COMPREHENSIVE METABOLIC PANEL
Albumin: 2.6 g/dL — ABNORMAL LOW (ref 3.5–5.2)
Alkaline Phosphatase: 88 U/L (ref 39–117)
BUN: 5 mg/dL — ABNORMAL LOW (ref 6–23)
CO2: 21 mEq/L (ref 19–32)
Chloride: 105 mEq/L (ref 96–112)
Creatinine, Ser: 0.86 mg/dL (ref 0.50–1.35)
GFR calc Af Amer: >90 mL/min (ref >90)
GFR calc non Af Amer: >90 mL/min (ref >90)
Glucose, Bld: 83 mg/dL (ref 70–99)
Total Bilirubin: 0.4 mg/dL (ref 0.3–1.2)

## 2012-01-04 LAB — CBC
Platelets: 158 10*3/uL (ref 150–400)
RBC: 3.28 MIL/uL — ABNORMAL LOW (ref 4.22–5.81)
RDW: 16.1 % — ABNORMAL HIGH (ref 11.5–15.5)
WBC: 8.4 10*3/uL (ref 4.0–10.5)

## 2012-01-04 LAB — HEMOGLOBIN A1C
Hgb A1c MFr Bld: 5.4 % (ref <5.7)
Mean Plasma Glucose: 108 mg/dL (ref <117)

## 2012-01-04 LAB — MAGNESIUM: Magnesium: 1.4 mg/dL — ABNORMAL LOW (ref 1.5–2.5)

## 2012-01-04 LAB — LIPASE, BLOOD: Lipase: 79 U/L — ABNORMAL HIGH (ref 11–59)

## 2012-01-04 LAB — EXPECTORATED SPUTUM ASSESSMENT W GRAM STAIN, RFLX TO RESP C: Special Requests: NORMAL

## 2012-01-04 LAB — HEMOGLOBIN AND HEMATOCRIT, BLOOD: HCT: 25.6 % — ABNORMAL LOW (ref 39.0–52.0)

## 2012-01-04 LAB — PHOSPHORUS: Phosphorus: 3.6 mg/dL (ref 2.3–4.6)

## 2012-01-04 MED ORDER — POTASSIUM CHLORIDE CRYS ER 20 MEQ PO TBCR
40.0000 meq | EXTENDED_RELEASE_TABLET | Freq: Two times a day (BID) | ORAL | Status: AC
  Administered 2012-01-04 (×2): 40 meq via ORAL
  Filled 2012-01-04 (×2): qty 2

## 2012-01-04 MED ORDER — POTASSIUM CHLORIDE 10 MEQ/100ML IV SOLN
10.0000 meq | INTRAVENOUS | Status: AC
Start: 2012-01-04 — End: 2012-01-04
  Administered 2012-01-04 (×2): 10 meq via INTRAVENOUS
  Filled 2012-01-04 (×2): qty 100

## 2012-01-04 MED ORDER — MAGNESIUM SULFATE 40 MG/ML IJ SOLN
2.0000 g | Freq: Once | INTRAMUSCULAR | Status: AC
  Administered 2012-01-04: 2 g via INTRAVENOUS
  Filled 2012-01-04 (×2): qty 50

## 2012-01-04 NOTE — Evaluation (Signed)
Clinical/Bedside Swallow Evaluation Patient Details  Name: Peter [name] MRN: [id number] Date of Birth: [birth year]  Today's Date: 01/04/2012 Time: 4098-1191 SLP Time Calculation (min): 20 min  Past Medical History:  Past Medical History  Diagnosis Date  . Hypertension   . Hyperlipemia   . Emphysema   . Stroke   . Chronic pain   . Cirrhosis of liver   . Fall   . Dental injury   . Bone spur of other site     Left Foot  . Impaired mobility   . Total self care deficit   . Pneumonia    Past Surgical History:  Past Surgical History  Procedure Date  . No past surgeries    HPI:  61 year old male admitted 01/03/12.  PMH significant for ETOH, COPD, CVA, HTN, PNA, emphysema.   Assessment / Plan / Recommendation Clinical Impression  No overt s/s aspiration, however, pt has multiple high risk indicators, including CVA, COPD, report of difficulty initiating swallow, PNA.  Will complete MBS to objectively assess swallow function and safety.    Aspiration Risk  Moderate    Diet Recommendation Dysphagia 2 (Fine chop);Thin liquid   Liquid Administration via: Cup;Straw Medication Administration: Whole meds with liquid Supervision: Intermittent supervision to cue for compensatory strategies Compensations: Slow rate;Small sips/bites Postural Changes and/or Swallow Maneuvers: Seated upright 90 degrees;Upright 30-60 min after meal    Other  Recommendations Recommended Consults: MBS Oral Care Recommendations: Oral care BID   Follow Up Recommendations  Other (comment) (pending MBS)    Frequency and Duration     pending MBS   Pertinent Vitals/Pain No pain reported    SLP Swallow Goals  Pending completion of MBS   Swallow Study Prior Functional Status   Soft foods (due to poor dentition), thin liquids    General Date of Onset: 01/03/12 HPI: 61 year old male admitted 01/03/12.  PMH significant for ETOH, COPD, CVA, HTN, PNA, emphysema. Type of Study: Bedside swallow  evaluation Diet Prior to this Study: NPO Temperature Spikes Noted: No Respiratory Status: Room air History of Recent Intubation: No Behavior/Cognition: Alert;Cooperative;Pleasant mood Oral Cavity - Dentition: Poor condition;Missing dentition Self-Feeding Abilities: Able to feed self Patient Positioning: Upright in bed Baseline Vocal Quality: Clear Volitional Cough: Strong Volitional Swallow: Able to elicit    Oral/Motor/Sensory Function Overall Oral Motor/Sensory Function: Appears within functional limits for tasks assessed   Ice Chips Ice chips: Not tested   Thin Liquid Thin Liquid: Within functional limits Presentation: Self Fed;Straw    Nectar Thick Nectar Thick Liquid: Not tested   Honey Thick Honey Thick Liquid: Not tested   Puree Puree: Within functional limits Presentation: Self Fed;Spoon   Solid Solid: Not tested (pt unable to chew cracker - poor dentition)   [name] B. Lavera Vandermeer, MSP, CCC-SLP [phone] [name] 01/04/2012,10:26 AM

## 2012-01-04 NOTE — Progress Notes (Signed)
Patient ID: Peter [name], male   DOB: 29-Nov-1950, 61 y.o.   MRN: [id number]    Subjective: Denies SOB  Objective: Vital signs in last 24 hours: Temp:  [99.5 F (37.5 C)-101.9 F (38.8 C)] 99.5 F (37.5 C) (06/30 0409) Pulse Rate:  [84-135] 84  (06/30 0409) Resp:  [20-30] 20  (06/30 0409) BP: (121-148)/(72-97) 138/75 mmHg (06/30 0409) SpO2:  [92 %-100 %] 95 % (06/30 0409) FiO2 (%):  [99 %] 99 % (06/29 1120) Weight:  [62.4 kg (137 lb 9.1 oz)] 62.4 kg (137 lb 9.1 oz) (06/29 1612) Last BM Date: 01/03/12  Intake/Output from previous day: 06/29 0701 - 06/30 0700 In: 1300 [I.V.:1200; IV Piggyback:100] Out: 976 [Urine:975; Stool:1] Intake/Output this shift:    General appearance: alert and cooperative Resp: few R rhonchi Cardio: regular rate and rhythm GI: soft, NT, ND  Lab Results: CBC   Basename 01/04/12 0410 01/03/12 1727  WBC 8.4 11.9*  HGB 8.7* 9.8*  HCT 25.9* 29.5*  PLT 158 190   BMET  Basename 01/04/12 0410 01/03/12 1727 01/03/12 0923  NA 142 -- 141  K 3.2* -- 3.6  CL 105 -- 104  CO2 21 -- --  GLUCOSE 83 -- 116*  BUN 5* -- <3*  CREATININE 0.86 0.81 --  CALCIUM 8.2* -- --   PT/INR  Basename 01/03/12 1727 01/03/12 0925  LABPROT 14.7 13.3  INR 1.13 0.99   ABG No results found for this basename: PHART:2,PCO2:2,PO2:2,HCO3:2 in the last 72 hours  Studies/Results: Dg Ribs Unilateral Left  01/03/2012  *RADIOLOGY REPORT*  Clinical Data: Fall, left chest pain  LEFT RIBS - 2 VIEW  Comparison: None.  Findings: Right lower lobe opacity, incomplete visualized, as described on prior chest radiographs.  Minimally displaced left lateral 5th, 6th, and possibly 7th rib fractures.  IMPRESSION: Minimally displaced left lateral 5th, 6th, and possibly 7th rib fractures.  Original Report Authenticated By: Charline Bills, M.D.   Ct Head Wo Contrast  01/03/2012  *RADIOLOGY REPORT*  Clinical Data: Multiple falls.  CT HEAD WITHOUT CONTRAST  Technique:  Contiguous axial images  were obtained from the base of the skull through the vertex without contrast.  Comparison: None.  Findings: There is diffuse patchy low density throughout the subcortical and periventricular white matter consistent with chronic small vessel ischemic change.  There is prominence of the sulci and ventricles consistent with brain atrophy .  There is no evidence for acute brain infarct, hemorrhage or mass.  There is no evidence for acute brain infarct, hemorrhage or mass.  There is mild mucosal thickening involving the ethmoid air cells and maxillary sinuses.  The mastoid air cells are clear.  The skull is intact.  IMPRESSION:  1.  No acute intracranial abnormalities.  Original Report Authenticated By: Rosealee Albee, M.D.   Ct Abdomen Pelvis W Contrast  01/03/2012  *RADIOLOGY REPORT*  Clinical Data: Dizziness.  History of severe repeated alcohol intoxication.  Multiple falls and syncopal events in recent days. History of trauma from a fall off of a porch.  CT ABDOMEN AND PELVIS WITH CONTRAST  Technique:  Multidetector CT imaging of the abdomen and pelvis was performed following the standard protocol during bolus administration of intravenous contrast.  Contrast: OMNIPAQUE IOHEXOL 300 MG/ML  SOLN  Comparison: No priors.  Findings:  Lung Bases: Extensive air space consolidation in the right middle lobe with multiple air bronchograms.  Patchy ground-glass attenuation and air space consolidation is present to a much lesser extent within the right lower  lobe as well.  Abdomen/Pelvis:  Multiple low attenuation lesions scattered throughout the hepatic parenchyma, measuring up to 9 mm in diameter, all of which are too small to definitively characterize. There is some high attenuation material layering dependently in the lumen of the gallbladder, which likely represents biliary sludge. No gallstones or findings to suggest acute cholecystitis at this time.  The enhanced appearance of the pancreas, spleen, bilateral  adrenal glands and bilateral kidneys is unremarkable.  Normal appendix.  Atherosclerosis in the abdominal and pelvic vasculature, without evidence of aneurysm or dissection.  There is no ascites or pneumoperitoneum and no pathologic distension of bowel.  No definite pathologic lymphadenopathy identified within the abdomen or pelvis.  The colon is nearly completely decompressed which results in apparent diffuse colonic wall thickening.  This is favored to be secondary to under distension, rather than an underlying pathologic state.  Prostate and urinary bladder are unremarkable in appearance.  Musculoskeletal: There are no aggressive appearing lytic or blastic lesions noted in the visualized portions of the skeleton.  IMPRESSION: 1.  Extensive airspace consolidation in the right middle lobe, and to a lesser extent in the right lower lobe, concerning for pneumonia.  Given the patient's history, this may represent sequelae of aspiration. There is also a small right-sided pleural effusion layering dependently. 2. Biliary sludge within the gallbladder, without evidence of acute cholecystitis. 3.  Numerous low attenuation lesions scattered throughout the hepatic parenchyma are too small to definitively characterize. These are statistically favored to represent small cysts or biliary hamartomas.  This would require MRI for further characterization if clinically indicated. 4.  Normal appendix. 5.  Atherosclerosis.  Original Report Authenticated By: Florencia Reasons, M.D.   Dg Chest Port 1 View  01/04/2012  *RADIOLOGY REPORT*  Clinical Data: Chest pain  PORTABLE CHEST - 1 VIEW  Comparison:   the previous day's study  Findings: There is more dense airspace consolidation in the right middle lobe.  Left lung remains clear.  Heart size normal.  No definite effusion.  Old left clavicle fracture.  IMPRESSION:  1.  Progressive dense airspace consolidation in the right middle lobe.  Original Report Authenticated By: Osa Craver, M.D.   Dg Chest Portable 1 View  01/03/2012  *RADIOLOGY REPORT*  Clinical Data: Dizziness  PORTABLE CHEST - 1 VIEW  Comparison: 02/12/2011  Findings: The heart size is normal.  No pleural effusion or edema. Dense airspace consolidation within the right lower lobe is identified, new from previous exam.  There are no displaced rib fractures identified.  IMPRESSION:  1.  Dense airspace consolidation in the right base.  In the acute setting this is most likely due to pneumonia. If there is a history of trauma and no symptoms of pneumonia then this could represent an area of pulmonary contusion.  If the patient is asymptomatic then these findings would be concerning for a pulmonary mass.  Careful clinical correlation and close interval radiographic followup is advised.  Original Report Authenticated By: Rosealee Albee, M.D.   Dg Foot Complete Right  01/03/2012  *RADIOLOGY REPORT*  Clinical Data: Fall, foot pain  RIGHT FOOT COMPLETE - 3+ VIEW  Comparison: None.  Findings: No fracture or dislocation is seen.  The joint spaces are preserved.  The visualized soft tissues are unremarkable.  IMPRESSION: No fracture or dislocation is seen.  Original Report Authenticated By: Charline Bills, M.D.    Anti-infectives: Anti-infectives     Start     Dose/Rate Route Frequency Ordered  Stop   01/03/12 1730  piperacillin-tazobactam (ZOSYN) IVPB 3.375 g       3.375 g 12.5 mL/hr over 240 Minutes Intravenous Every 8 hours 01/03/12 1614     01/03/12 1700   piperacillin-tazobactam (ZOSYN) IVPB 3.375 g  Status:  Discontinued        3.375 g 100 mL/hr over 30 Minutes Intravenous  Once 01/03/12 1555 01/03/12 1618   01/03/12 1615   moxifloxacin (AVELOX) IVPB 400 mg  Status:  Discontinued        400 mg 250 mL/hr over 60 Minutes Intravenous Every 24 hours 01/03/12 1613 01/03/12 1717   01/03/12 1030   cefTRIAXone (ROCEPHIN) 1 g in dextrose 5 % 50 mL IVPB        1 g 100 mL/hr over 30 Minutes Intravenous   Once 01/03/12 1023 01/03/12 1207   01/03/12 1030   azithromycin (ZITHROMAX) 500 mg in dextrose 5 % 250 mL IVPB        500 mg 250 mL/hr over 60 Minutes Intravenous  Once 01/03/12 1023 01/03/12 1314          Assessment/Plan: Fall L rib Fx 5-7 - continue bronchodilators and pulmonary toilet.  IS ordered yesterday PNA and other medical issues per primary team   LOS: 1 day    Violeta Gelinas, MD, MPH, FACS Pager: [phone]  01/04/2012

## 2012-01-04 NOTE — Progress Notes (Signed)
  Echocardiogram 2D Echocardiogram has been performed.  [name] 01/04/2012, 5:50 PM

## 2012-01-04 NOTE — Evaluation (Signed)
Physical Therapy Evaluation Patient Details Name: Peter [name] MRN: [id number] DOB: [birth year] Today's Date: 01/04/2012 Time: 1191-4782 PT Time Calculation (min): 19 min  PT Assessment / Plan / Recommendation Clinical Impression  Patient is a 61 yo male admitted with possible ETOH withdrawal, multiple falls, N/V, left rib fractures, pna, encephalopathy.  Patient currently uses cane with gait.  Patient needs to be using a rolling walker for increased safety due to decreased balance and multiple falls.  Recommend patient continue HHPT at discharge.  Will follow acutely to address strengthening, mobility, gait, and education.    PT Assessment  Patient needs continued PT services    Follow Up Recommendations  Home health PT;Supervision - Intermittent    Barriers to Discharge Decreased caregiver support      Equipment Recommendations  Rolling walker with 5" wheels;3 in 1 bedside comode;Tub/shower seat    Recommendations for Other Services     Frequency Min 3X/week    Precautions / Restrictions Precautions Precautions: Fall Precaution Comments: ? ETOH withdrawal Restrictions Weight Bearing Restrictions: No         Mobility  Bed Mobility Bed Mobility: Supine to Sit;Sitting - Scoot to Edge of Bed;Sit to Supine Supine to Sit: 4: Min assist;HOB flat;With rails Sitting - Scoot to Edge of Bed: 5: Supervision Sit to Supine: 4: Min assist;HOB flat Details for Bed Mobility Assistance: Verbal cues for technique and safety. Patient moving prior to instructions given. Transfers Transfers: Sit to Stand;Stand to Sit Sit to Stand: 3: Mod assist;With upper extremity assist;From bed Stand to Sit: 4: Min assist;With upper extremity assist;To bed Details for Transfer Assistance: Verbal cues for hand placement, and to scoot to EOB before standing.  Verbal cues for safety when returning to bed.  Started sitting before completely against bed. Ambulation/Gait Ambulation/Gait Assistance: 4: Min  assist (+1 for lines) Ambulation Distance (Feet): 62 Feet Assistive device: Rolling walker Ambulation/Gait Assistance Details: Cues for safe use of RW.  Painful gait with spurs on left foot.  Unsteady gait with tremors/shaking during gait. Gait Pattern: Step-through pattern;Decreased stride length;Ataxic;Antalgic;Wide base of support;Trunk flexed Gait velocity: Slow gait speed    Exercises     PT Diagnosis: Difficulty walking;Abnormality of gait;Generalized weakness;Acute pain  PT Problem List: Decreased strength;Decreased activity tolerance;Decreased balance;Decreased mobility;Decreased cognition;Decreased knowledge of use of DME;Decreased safety awareness;Pain PT Treatment Interventions: DME instruction;Gait training;Stair training;Functional mobility training;Therapeutic exercise;Patient/family education   PT Goals Acute Rehab PT Goals PT Goal Formulation: With patient Time For Goal Achievement: 01/11/12 Potential to Achieve Goals: Good Pt will go Supine/Side to Sit: Independently;with HOB 0 degrees PT Goal: Supine/Side to Sit - Progress: Goal set today Pt will go Sit to Supine/Side: Independently;with HOB 0 degrees PT Goal: Sit to Supine/Side - Progress: Goal set today Pt will go Sit to Stand: with modified independence;with upper extremity assist PT Goal: Sit to Stand - Progress: Goal set today Pt will go Stand to Sit: with modified independence;with upper extremity assist PT Goal: Stand to Sit - Progress: Goal set today Pt will Ambulate: 51 - 150 feet;with modified independence;with rolling walker PT Goal: Ambulate - Progress: Goal set today Pt will Go Up / Down Stairs: 3-5 stairs;with supervision;with least restrictive assistive device PT Goal: Up/Down Stairs - Progress: Goal set today  Visit Information  Last PT Received On: 01/04/12 Assistance Needed: +2    Subjective Data  Subjective: "I just pass out and fall." Patient Stated Goal: To get back home.   Prior  Functioning  Home Living Lives With: Alone Available  Help at Discharge: Family;Home health (Cousin) Type of Home: House Home Access: Stairs to enter [employer] of Steps: 3 Entrance Stairs-Rails: Right Home Layout: One level Bathroom Shower/Tub: Engineer, manufacturing systems: Standard Home Adaptive Equipment: Straight cane Additional Comments: Cousin comes in 2x/day Prior Function Level of Independence: Needs assistance Needs Assistance: Meal Prep;Light Housekeeping Meal Prep: Moderate Light Housekeeping: Maximal Able to Take Stairs?: Yes Driving: No Communication Communication: No difficulties    Cognition  Overall Cognitive Status: Impaired Area of Impairment: Safety/judgement;Awareness of deficits Arousal/Alertness: Awake/alert Orientation Level: Appears intact for tasks assessed Behavior During Session: [facility] for tasks performed Safety/Judgement: Decreased safety judgement for tasks assessed;Decreased awareness of need for assistance Safety/Judgement - Other Comments: Underestimates falls. Hesitant about equipment needed (shower chair). Awareness of Deficits: Decreased awareness of balance issues and falls risk.    Extremity/Trunk Assessment Right Upper Extremity Assessment RUE ROM/Strength/Tone: [facility] for tasks assessed Left Upper Extremity Assessment LUE ROM/Strength/Tone: WFL for tasks assessed Right Lower Extremity Assessment RLE ROM/Strength/Tone: WFL for tasks assessed (general weakness) RLE Coordination: Deficits RLE Coordination Deficits: Tremors/shaking of LE's with movement/gait. Left Lower Extremity Assessment LLE ROM/Strength/Tone: WFL for tasks assessed (general weakness) LLE Coordination: Deficits LLE Coordination Deficits: Tremors/shaking of LE's with gait   Balance    End of Session PT - End of Session Equipment Utilized During Treatment: Gait belt Activity Tolerance: Patient limited by pain;Patient limited by fatigue;Treatment limited  secondary to medical complications (Comment) (HR increased to 121 with gait.) Patient left: in bed;with call bell/phone within reach;with family/visitor present Nurse Communication: Mobility status;Patient requests pain meds  GP     Vena Austria 01/04/2012, 4:02 PM Durenda Hurt. Renaldo Fiddler, [facility] Acute Rehab Services Pager [phone]

## 2012-01-04 NOTE — Progress Notes (Signed)
[facility] PROGRESS NOTE  Peter [name] AVW:[id number] DOB: 1951/01/19 DOA: 01/03/2012 PCP: No primary provider on file.  Assessment/Plan:  1. Aspiration Pneumonia: -started him on zosyn  -aspiration precautions  -SLP evaluation - dysphagia 2 diet.  -pulmonary toilet  -bronchodilators  -nasal oxygen as required  -sputum cultures pending -urine for legionella antigen and streptococcal antigen pending. 2. RIb fractures:  - pain control  -incentive spirometry  -repeat CXR for evaluation of pulm contusion on Tuesday. - trauma surgery input appreciated. 3. Alcohol abuse:  - education  -ciwa protocol  -in SDU for monitoring of DT's  - iv fluids with thiamine and folic acid.  -CCSW consult  4. H/o Multiple falls at home:  eval for syncope  -on tele for evaluation of arrhythmias  -2 d echocardiogram  -enzymes negative. 5, Encephalopathic secondary to alcohol intoxication  - resolved. -fall precautions  -DT precautions  -ammonia level normal.  - tremors in his hands, probably from withdrawal effect. 6. COPD:  NEBS AS NEEDED.  7. Diarrhea:  - with h/o recent clindamycin use,  c diff ruled out.   Stool culture.  Fever/tachycardia: resolved. -possibly from pneumonia and dehydration from diarrhea.  guiac positive stools:  He has h/o cirrhosis of liver, will need to evaluate for variceal bleeding.  - pt sees Dr hung as outpatient , called GI consult. Hemoglobin dropped from admission probably from hemodilution, no active bleeding.  -Q12H&H  PPI.  ANEMIA: anemia panel ordered. Possibly AOCD.  Elevated Liver enzymes:  -improving. Possibly from alcohol abuse and cirrhosis of liver  Elevated lipase, possibly mild pancreatitis .   Active Problems:  Alcohol withdrawal  COPD (chronic obstructive pulmonary disease)  CAP (community acquired pneumonia)  Code Status: full code Family Communication: spoke to the family at bedside Disposition Plan: PENDING PT /OT  evaluation  Ana Liaw, MD  [facility] Pager [phone]  If 7PM-7AM, please contact night-coverage [email or web address] Password [email or web address] 01/04/2012, 9:32 AM   LOS: 1 day   Brief narrative: 61 year old gentleman brought by his cousin, for multiple falls over the last few weeks, nausea, vomiting, he was found to have rib fractures, possibly aspiration pneumonia,  Consultants:  Dr Elnoria Howard called for Gastroenterology  Procedures:  CXR  Antibiotics:  Zosyn.  HPI/Subjective: Reports he is feeling better than yesterday.   Objective: Filed Vitals:   01/04/12 0053 01/04/12 0409 01/04/12 0829 01/04/12 0925  BP:  138/75    Pulse: 88 84  87  Temp: 99.9 F (37.7 C) 99.5 F (37.5 C)    TempSrc: Oral Oral    Resp: 22 20  21   Height:      Weight:      SpO2: 96% 95% 96%     Intake/Output Summary (Last 24 hours) at 01/04/12 0932 Last data filed at 01/04/12 0842  Gross per 24 hour  Intake   2350 ml  Output    976 ml  Net   1374 ml    Exam:   General:  Alert, low grade temp. comfortable  Cardiovascular:  s1s2 RRR  Respiratory: decreased air entry on the right side. Scattered rhonchi  Abdomen: soft NT ND bs+  Extremities: no pedal edema, cyanosis or clubbing.   Data Reviewed: Basic Metabolic Panel:  Lab 01/04/12 3086 01/03/12 1727 01/03/12 0923  NA 142 -- 141  K 3.2* -- 3.6  CL 105 -- 104  CO2 21 -- --  GLUCOSE 83 -- 116*  BUN 5* -- <3*  CREATININE 0.86 0.81 0.90  CALCIUM 8.2* -- --  MG 1.4* -- --  PHOS 3.6 -- --   Liver Function Tests:  Lab 01/04/12 0410 01/03/12 0905  AST 55* 106*  ALT 31 50  ALKPHOS 88 122*  BILITOT 0.4 0.3  PROT 6.4 8.4*  ALBUMIN 2.6* 3.6    Lab 01/03/12 0905  LIPASE 77*  AMYLASE --    Lab 01/03/12 1721  AMMONIA 37   CBC:  Lab 01/04/12 0410 01/03/12 1727 01/03/12 0923 01/03/12 0905  WBC 8.4 11.9* -- 9.8  NEUTROABS -- -- -- 8.5*  HGB 8.7* 9.8* 12.2* 10.9*  HCT 25.9* 29.5* 36.0* 32.3*  MCV 79.0 78.9 -- 79.2   PLT 158 190 -- 203   Cardiac Enzymes:  Lab 01/03/12 0851  CKTOTAL --  CKMB --  CKMBINDEX --  TROPONINI <0.30   BNP (last 3 results) No results found for this basename: PROBNP:3 in the last 8760 hours CBG: No results found for this basename: GLUCAP:5 in the last 168 hours  Recent Results (from the past 240 hour(s))  MRSA PCR SCREENING     Status: Normal   Collection Time   01/03/12  4:46 PM      Component Value Range Status Comment   MRSA by PCR NEGATIVE  NEGATIVE Final   CULTURE, EXPECTORATED SPUTUM-ASSESSMENT     Status: Normal   Collection Time   01/04/12  6:17 AM      Component Value Range Status Comment   Specimen Description SPUTUM   Final    Special Requests Normal   Final    Sputum evaluation     Final    Value: MICROSCOPIC FINDINGS SUGGEST THAT THIS SPECIMEN IS NOT REPRESENTATIVE OF LOWER RESPIRATORY SECRETIONS. PLEASE RECOLLECT.     CALLED TO A.PETTIFORD,RN 1610 01/04/12 BY M.CAMPBELL   Report Status 01/04/2012 FINAL   Final      Studies: Dg Ribs Unilateral Left  01/03/2012  *RADIOLOGY REPORT*  Clinical Data: Fall, left chest pain  LEFT RIBS - 2 VIEW  Comparison: None.  Findings: Right lower lobe opacity, incomplete visualized, as described on prior chest radiographs.  Minimally displaced left lateral 5th, 6th, and possibly 7th rib fractures.  IMPRESSION: Minimally displaced left lateral 5th, 6th, and possibly 7th rib fractures.  Original Report Authenticated By: Charline Bills, M.D.   Ct Head Wo Contrast  01/03/2012  *RADIOLOGY REPORT*  Clinical Data: Multiple falls.  CT HEAD WITHOUT CONTRAST  Technique:  Contiguous axial images were obtained from the base of the skull through the vertex without contrast.  Comparison: None.  Findings: There is diffuse patchy low density throughout the subcortical and periventricular white matter consistent with chronic small vessel ischemic change.  There is prominence of the sulci and ventricles consistent with brain atrophy .   There is no evidence for acute brain infarct, hemorrhage or mass.  There is no evidence for acute brain infarct, hemorrhage or mass.  There is mild mucosal thickening involving the ethmoid air cells and maxillary sinuses.  The mastoid air cells are clear.  The skull is intact.  IMPRESSION:  1.  No acute intracranial abnormalities.  Original Report Authenticated By: Rosealee Albee, M.D.   Ct Abdomen Pelvis W Contrast  01/03/2012  *RADIOLOGY REPORT*  Clinical Data: Dizziness.  History of severe repeated alcohol intoxication.  Multiple falls and syncopal events in recent days. History of trauma from a fall off of a porch.  CT ABDOMEN AND PELVIS WITH CONTRAST  Technique:  Multidetector CT imaging of the abdomen and pelvis was performed following the  standard protocol during bolus administration of intravenous contrast.  Contrast: OMNIPAQUE IOHEXOL 300 MG/ML  SOLN  Comparison: No priors.  Findings:  Lung Bases: Extensive air space consolidation in the right middle lobe with multiple air bronchograms.  Patchy ground-glass attenuation and air space consolidation is present to a much lesser extent within the right lower lobe as well.  Abdomen/Pelvis:  Multiple low attenuation lesions scattered throughout the hepatic parenchyma, measuring up to 9 mm in diameter, all of which are too small to definitively characterize. There is some high attenuation material layering dependently in the lumen of the gallbladder, which likely represents biliary sludge. No gallstones or findings to suggest acute cholecystitis at this time.  The enhanced appearance of the pancreas, spleen, bilateral adrenal glands and bilateral kidneys is unremarkable.  Normal appendix.  Atherosclerosis in the abdominal and pelvic vasculature, without evidence of aneurysm or dissection.  There is no ascites or pneumoperitoneum and no pathologic distension of bowel.  No definite pathologic lymphadenopathy identified within the abdomen or pelvis.  The  colon is nearly completely decompressed which results in apparent diffuse colonic wall thickening.  This is favored to be secondary to under distension, rather than an underlying pathologic state.  Prostate and urinary bladder are unremarkable in appearance.  Musculoskeletal: There are no aggressive appearing lytic or blastic lesions noted in the visualized portions of the skeleton.  IMPRESSION: 1.  Extensive airspace consolidation in the right middle lobe, and to a lesser extent in the right lower lobe, concerning for pneumonia.  Given the patient's history, this may represent sequelae of aspiration. There is also a small right-sided pleural effusion layering dependently. 2. Biliary sludge within the gallbladder, without evidence of acute cholecystitis. 3.  Numerous low attenuation lesions scattered throughout the hepatic parenchyma are too small to definitively characterize. These are statistically favored to represent small cysts or biliary hamartomas.  This would require MRI for further characterization if clinically indicated. 4.  Normal appendix. 5.  Atherosclerosis.  Original Report Authenticated By: Florencia Reasons, M.D.   Dg Chest Port 1 View  01/04/2012  *RADIOLOGY REPORT*  Clinical Data: Chest pain  PORTABLE CHEST - 1 VIEW  Comparison:   the previous day's study  Findings: There is more dense airspace consolidation in the right middle lobe.  Left lung remains clear.  Heart size normal.  No definite effusion.  Old left clavicle fracture.  IMPRESSION:  1.  Progressive dense airspace consolidation in the right middle lobe.  Original Report Authenticated By: Osa Craver, M.D.   Dg Chest Portable 1 View  01/03/2012  *RADIOLOGY REPORT*  Clinical Data: Dizziness  PORTABLE CHEST - 1 VIEW  Comparison: 02/12/2011  Findings: The heart size is normal.  No pleural effusion or edema. Dense airspace consolidation within the right lower lobe is identified, new from previous exam.  There are no  displaced rib fractures identified.  IMPRESSION:  1.  Dense airspace consolidation in the right base.  In the acute setting this is most likely due to pneumonia. If there is a history of trauma and no symptoms of pneumonia then this could represent an area of pulmonary contusion.  If the patient is asymptomatic then these findings would be concerning for a pulmonary mass.  Careful clinical correlation and close interval radiographic followup is advised.  Original Report Authenticated By: Rosealee Albee, M.D.   Dg Foot Complete Right  01/03/2012  *RADIOLOGY REPORT*  Clinical Data: Fall, foot pain  RIGHT FOOT COMPLETE - 3+ VIEW  Comparison: None.  Findings: No fracture or dislocation is seen.  The joint spaces are preserved.  The visualized soft tissues are unremarkable.  IMPRESSION: No fracture or dislocation is seen.  Original Report Authenticated By: Charline Bills, M.D.    Scheduled Meds:   . acetaminophen  650 mg Oral Once  . albuterol  2.5 mg Nebulization Q6H  . aspirin EC  81 mg Oral Daily  . azithromycin  500 mg Intravenous Once  . cefTRIAXone (ROCEPHIN)  IV  1 g Intravenous Once  . ferrous sulfate  325 mg Oral Q breakfast  . folic acid  1 mg Oral Daily  . ipratropium  0.5 mg Nebulization Q6H  . LORazepam  0-4 mg Intravenous Q6H   Followed by  . LORazepam  0-4 mg Intravenous Q12H  . LORazepam  2 mg Intravenous Once  . magnesium sulfate 1 - 4 g bolus IVPB  2 g Intravenous Once  . multivitamin with minerals  1 tablet Oral Daily  . pantoprazole (PROTONIX) IV  40 mg Intravenous Q12H  . piperacillin-tazobactam (ZOSYN)  IV  3.375 g Intravenous Q8H  . pneumococcal 23 valent vaccine  0.5 mL Intramuscular Tomorrow-1000  . potassium chloride  10 mEq Intravenous Q1 Hr x 2  . potassium chloride  40 mEq Oral BID  . simvastatin  20 mg Oral q1800  . general admission iv infusion   Intravenous Once  . sodium chloride  1,000 mL Intravenous Once  . sodium chloride  1,000 mL Intravenous Once  .  sodium chloride  3 mL Intravenous Q12H  . thiamine  100 mg Oral Daily   Or  . thiamine  100 mg Intravenous Daily  . DISCONTD: enoxaparin  40 mg Subcutaneous Q24H  . DISCONTD: folic acid  1 mg Oral Daily  . DISCONTD: folic acid  1 mg Oral Daily  . DISCONTD: LORazepam  1 mg Intravenous Once  . DISCONTD: moxifloxacin  400 mg Intravenous Q24H  . DISCONTD: multivitamin with minerals  1 tablet Oral Daily  . DISCONTD: piperacillin-tazobactam  3.375 g Intravenous Once  . DISCONTD: thiamine  100 mg Intravenous Daily  . DISCONTD: thiamine  100 mg Intravenous Daily  . DISCONTD: thiamine  100 mg Oral Daily  . DISCONTD: thiamine  100 mg Oral Daily   Continuous Infusions:   . DISCONTD: sodium chloride 100 mL/hr at 01/03/12 1615

## 2012-01-04 NOTE — Plan of Care (Signed)
Problem: Discharge Progression Outcomes Goal: Tolerating diet Outcome: Progressing Peter [name] B. [name], [facility], CCC-SLP [phone]

## 2012-01-04 NOTE — Consult Note (Signed)
Reason for Consult:Anemia, Heme positivity, Hematochezia, ETOH abuse. Referring Physician: [facility].  Peter [name] HPI: This is a 61 year old male alcoholic who I evaluated for the first time in 11/2011.  At that time I was consulted to see if he had cirrhosis, but I was not able to discern if that was the case.  I asked him to stop drinking ETOH and to follow up in three months for a repeat liver panel.  However, he continued to drink and now he is admitted for aspiration pneumonia and rib fractures.  He reports having a syncopal episode, which precipitated his admission.  There is also a report of hematochezia and further work up revealed heme positive stool and an anemia.  As a result of these findings a GI consultation was requested.  Past Medical History  Diagnosis Date  . Hypertension   . Hyperlipemia   . Emphysema   . Stroke   . Chronic pain   . Cirrhosis of liver   . Fall   . Dental injury   . Bone spur of other site     Left Foot  . Impaired mobility   . Total self care deficit   . Pneumonia     Past Surgical History  Procedure Date  . No past surgeries     History reviewed. No pertinent family history.  Social History:  reports that he has been smoking Cigarettes.  He has a 30 pack-year smoking history. He does not have any smokeless tobacco history on file. He reports that he drinks alcohol. He reports that he does not use illicit drugs.  Allergies:  Allergies  Allergen Reactions  . Poison Ivy Extract (Extract Of Poison Ivy) Rash    Medications:  Scheduled:   . albuterol  2.5 mg Nebulization Q6H  . aspirin EC  81 mg Oral Daily  . ferrous sulfate  325 mg Oral Q breakfast  . folic acid  1 mg Oral Daily  . ipratropium  0.5 mg Nebulization Q6H  . LORazepam  0-4 mg Intravenous Q6H   Followed by  . LORazepam  0-4 mg Intravenous Q12H  . magnesium sulfate 1 - 4 g bolus IVPB  2 g Intravenous Once  . multivitamin with minerals  1 tablet Oral Daily  .  pantoprazole (PROTONIX) IV  40 mg Intravenous Q12H  . piperacillin-tazobactam (ZOSYN)  IV  3.375 g Intravenous Q8H  . pneumococcal 23 valent vaccine  0.5 mL Intramuscular Tomorrow-1000  . potassium chloride  10 mEq Intravenous Q1 Hr x 2  . potassium chloride  40 mEq Oral BID  . simvastatin  20 mg Oral q1800  . sodium chloride  3 mL Intravenous Q12H  . thiamine  100 mg Oral Daily   Or  . thiamine  100 mg Intravenous Daily   Continuous:   . DISCONTD: sodium chloride 100 mL/hr at 01/03/12 1615    Results for orders placed during the hospital encounter of 01/03/12 (from the past 24 hour(s))  OCCULT BLOOD X 1 CARD TO LAB, STOOL     Status: Normal   Collection Time   01/03/12 11:06 PM      Component Value Range   Fecal Occult Bld POSITIVE    CLOSTRIDIUM DIFFICILE BY PCR     Status: Normal   Collection Time   01/03/12 11:06 PM      Component Value Range   C difficile by pcr NEGATIVE  NEGATIVE  COMPREHENSIVE METABOLIC PANEL     Status: Abnormal  Collection Time   01/04/12  4:10 AM      Component Value Range   Sodium 142  135 - 145 mEq/L   Potassium 3.2 (*) 3.5 - 5.1 mEq/L   Chloride 105  96 - 112 mEq/L   CO2 21  19 - 32 mEq/L   Glucose, Bld 83  70 - 99 mg/dL   BUN 5 (*) 6 - 23 mg/dL   Creatinine, Ser 4.69  0.50 - 1.35 mg/dL   Calcium 8.2 (*) 8.4 - 10.5 mg/dL   Total Protein 6.4  6.0 - 8.3 g/dL   Albumin 2.6 (*) 3.5 - 5.2 g/dL   AST 55 (*) 0 - 37 U/L   ALT 31  0 - 53 U/L   Alkaline Phosphatase 88  39 - 117 U/L   Total Bilirubin 0.4  0.3 - 1.2 mg/dL   GFR calc non Af Amer >90  >90 mL/min   GFR calc Af Amer >90  >90 mL/min  CBC     Status: Abnormal   Collection Time   01/04/12  4:10 AM      Component Value Range   WBC 8.4  4.0 - 10.5 K/uL   RBC 3.28 (*) 4.22 - 5.81 MIL/uL   Hemoglobin 8.7 (*) 13.0 - 17.0 g/dL   HCT 62.9 (*) 52.8 - 41.3 %   MCV 79.0  78.0 - 100.0 fL   MCH 26.5  26.0 - 34.0 pg   MCHC 33.6  30.0 - 36.0 g/dL   RDW 24.4 (*) 01.0 - 27.2 %   Platelets 158  150 -  400 K/uL  MAGNESIUM     Status: Abnormal   Collection Time   01/04/12  4:10 AM      Component Value Range   Magnesium 1.4 (*) 1.5 - 2.5 mg/dL  PHOSPHORUS     Status: Normal   Collection Time   01/04/12  4:10 AM      Component Value Range   Phosphorus 3.6  2.3 - 4.6 mg/dL  CULTURE, EXPECTORATED SPUTUM-ASSESSMENT     Status: Normal   Collection Time   01/04/12  6:17 AM      Component Value Range   Specimen Description SPUTUM     Special Requests Normal     Sputum evaluation       Value: MICROSCOPIC FINDINGS SUGGEST THAT THIS SPECIMEN IS NOT REPRESENTATIVE OF LOWER RESPIRATORY SECRETIONS. PLEASE RECOLLECT.     CALLED TO A.PETTIFORD,RN [id number] 01/04/12 BY M.CAMPBELL   Report Status 01/04/2012 FINAL    LIPASE, BLOOD     Status: Abnormal   Collection Time   01/04/12 11:10 AM      Component Value Range   Lipase 79 (*) 11 - 59 U/L  CULTURE, EXPECTORATED SPUTUM-ASSESSMENT     Status: Normal   Collection Time   01/04/12  4:13 PM      Component Value Range   Specimen Description SPUTUM     Special Requests Normal     Sputum evaluation       Value: MICROSCOPIC FINDINGS SUGGEST THAT THIS SPECIMEN IS NOT REPRESENTATIVE OF LOWER RESPIRATORY SECRETIONS. PLEASE RECOLLECT.     CALLED TO KAISER,ASHLEY RN 01/04/12 1709 WOOTEN,K   Report Status 01/04/2012 FINAL    HEMOGLOBIN AND HEMATOCRIT, BLOOD     Status: Abnormal   Collection Time   01/04/12  5:00 PM      Component Value Range   Hemoglobin 8.7 (*) 13.0 - 17.0 g/dL   HCT 44.0 (*) 34.7 -  52.0 %     Dg Ribs Unilateral Left  01/03/2012  *RADIOLOGY REPORT*  Clinical Data: Fall, left chest pain  LEFT RIBS - 2 VIEW  Comparison: None.  Findings: Right lower lobe opacity, incomplete visualized, as described on prior chest radiographs.  Minimally displaced left lateral 5th, 6th, and possibly 7th rib fractures.  IMPRESSION: Minimally displaced left lateral 5th, 6th, and possibly 7th rib fractures.  Original Report Authenticated By: Charline Bills, M.D.    Ct Head Wo Contrast  01/03/2012  *RADIOLOGY REPORT*  Clinical Data: Multiple falls.  CT HEAD WITHOUT CONTRAST  Technique:  Contiguous axial images were obtained from the base of the skull through the vertex without contrast.  Comparison: None.  Findings: There is diffuse patchy low density throughout the subcortical and periventricular white matter consistent with chronic small vessel ischemic change.  There is prominence of the sulci and ventricles consistent with brain atrophy .  There is no evidence for acute brain infarct, hemorrhage or mass.  There is no evidence for acute brain infarct, hemorrhage or mass.  There is mild mucosal thickening involving the ethmoid air cells and maxillary sinuses.  The mastoid air cells are clear.  The skull is intact.  IMPRESSION:  1.  No acute intracranial abnormalities.  Original Report Authenticated By: Rosealee Albee, M.D.   Ct Abdomen Pelvis W Contrast  01/03/2012  *RADIOLOGY REPORT*  Clinical Data: Dizziness.  History of severe repeated alcohol intoxication.  Multiple falls and syncopal events in recent days. History of trauma from a fall off of a porch.  CT ABDOMEN AND PELVIS WITH CONTRAST  Technique:  Multidetector CT imaging of the abdomen and pelvis was performed following the standard protocol during bolus administration of intravenous contrast.  Contrast: OMNIPAQUE IOHEXOL 300 MG/ML  SOLN  Comparison: No priors.  Findings:  Lung Bases: Extensive air space consolidation in the right middle lobe with multiple air bronchograms.  Patchy ground-glass attenuation and air space consolidation is present to a much lesser extent within the right lower lobe as well.  Abdomen/Pelvis:  Multiple low attenuation lesions scattered throughout the hepatic parenchyma, measuring up to 9 mm in diameter, all of which are too small to definitively characterize. There is some high attenuation material layering dependently in the lumen of the gallbladder, which likely  represents biliary sludge. No gallstones or findings to suggest acute cholecystitis at this time.  The enhanced appearance of the pancreas, spleen, bilateral adrenal glands and bilateral kidneys is unremarkable.  Normal appendix.  Atherosclerosis in the abdominal and pelvic vasculature, without evidence of aneurysm or dissection.  There is no ascites or pneumoperitoneum and no pathologic distension of bowel.  No definite pathologic lymphadenopathy identified within the abdomen or pelvis.  The colon is nearly completely decompressed which results in apparent diffuse colonic wall thickening.  This is favored to be secondary to under distension, rather than an underlying pathologic state.  Prostate and urinary bladder are unremarkable in appearance.  Musculoskeletal: There are no aggressive appearing lytic or blastic lesions noted in the visualized portions of the skeleton.  IMPRESSION: 1.  Extensive airspace consolidation in the right middle lobe, and to a lesser extent in the right lower lobe, concerning for pneumonia.  Given the patient's history, this may represent sequelae of aspiration. There is also a small right-sided pleural effusion layering dependently. 2. Biliary sludge within the gallbladder, without evidence of acute cholecystitis. 3.  Numerous low attenuation lesions scattered throughout the hepatic parenchyma are too small to definitively characterize.  These are statistically favored to represent small cysts or biliary hamartomas.  This would require MRI for further characterization if clinically indicated. 4.  Normal appendix. 5.  Atherosclerosis.  Original Report Authenticated By: Florencia Reasons, M.D.   Dg Chest Port 1 View  01/04/2012  *RADIOLOGY REPORT*  Clinical Data: Chest pain  PORTABLE CHEST - 1 VIEW  Comparison:   the previous day's study  Findings: There is more dense airspace consolidation in the right middle lobe.  Left lung remains clear.  Heart size normal.  No definite effusion.   Old left clavicle fracture.  IMPRESSION:  1.  Progressive dense airspace consolidation in the right middle lobe.  Original Report Authenticated By: Osa Craver, M.D.   Dg Chest Portable 1 View  01/03/2012  *RADIOLOGY REPORT*  Clinical Data: Dizziness  PORTABLE CHEST - 1 VIEW  Comparison: 02/12/2011  Findings: The heart size is normal.  No pleural effusion or edema. Dense airspace consolidation within the right lower lobe is identified, new from previous exam.  There are no displaced rib fractures identified.  IMPRESSION:  1.  Dense airspace consolidation in the right base.  In the acute setting this is most likely due to pneumonia. If there is a history of trauma and no symptoms of pneumonia then this could represent an area of pulmonary contusion.  If the patient is asymptomatic then these findings would be concerning for a pulmonary mass.  Careful clinical correlation and close interval radiographic followup is advised.  Original Report Authenticated By: Rosealee Albee, M.D.   Dg Foot Complete Right  01/03/2012  *RADIOLOGY REPORT*  Clinical Data: Fall, foot pain  RIGHT FOOT COMPLETE - 3+ VIEW  Comparison: None.  Findings: No fracture or dislocation is seen.  The joint spaces are preserved.  The visualized soft tissues are unremarkable.  IMPRESSION: No fracture or dislocation is seen.  Original Report Authenticated By: Charline Bills, M.D.    ROS:  As stated above in the HPI otherwise negative.  Blood pressure 128/82, pulse 87, temperature 98.9 F (37.2 C), temperature source Oral, resp. rate 20, height 5\' 7"  (1.702 m), weight 62.4 kg (137 lb 9.1 oz), SpO2 100.00%.    PE: Gen: NAD, Alert and Oriented HEENT:  /AT, EOMI Neck: Supple, no LAD Lungs: CTA Bilaterally CV: RRR without M/G/R ABM: Soft, NTND, +BS Ext: No C/C/E  Assessment/Plan: 1) Heme positive stool. 2) Anemia. 3) ETOH abuse. 4) ? Cirrhosis. 5) Aspiration pneumonia. 6) Rib fractures.   I evaluated the patient  in the office and I was not certain if he had cirrhosis.  I still do not know if he has cirrhosis as he never stopped drinking ETOH.  In light of his heme positivity and the anemia further evaluation with an EGD/Colonoscopy is required.  He reports having hematochezia, albeit a small amount.  The CT scan is negative for any evidence of portal HTN and there is no nodularity to his liver, however, the only way to diagnose cirrhosis is with a liver biopsy.  If the EGD reveals esophageal varices and/or portal HTN, I will conclude that he has cirrhosis.  I strongly advised him to stop ETOH and to go get help otherwise I plan, as with all my alcoholic patients, to discharge them if they do not seek help.  Plan: 1) EGD/Colonoscopy Tuesday as this is my first available time. 2) Continue with supportive care.  Satomi Buda D 01/04/2012, 7:07 PM

## 2012-01-05 ENCOUNTER — Inpatient Hospital Stay (HOSPITAL_COMMUNITY): Payer: Medicaid Other

## 2012-01-05 LAB — URINE CULTURE: Colony Count: NO GROWTH

## 2012-01-05 LAB — BASIC METABOLIC PANEL
BUN: 5 mg/dL — ABNORMAL LOW (ref 6–23)
Calcium: 8.8 mg/dL (ref 8.4–10.5)
GFR calc Af Amer: >90 mL/min (ref >90)
GFR calc non Af Amer: >90 mL/min (ref >90)
Potassium: 3.9 mEq/L (ref 3.5–5.1)
Sodium: 134 mEq/L — ABNORMAL LOW (ref 135–145)

## 2012-01-05 LAB — HEMOGLOBIN AND HEMATOCRIT, BLOOD
HCT: 28.1 % — ABNORMAL LOW (ref 39.0–52.0)
Hemoglobin: 10.5 g/dL — ABNORMAL LOW (ref 13.0–17.0)

## 2012-01-05 LAB — LEGIONELLA ANTIGEN, URINE

## 2012-01-05 MED ORDER — PEG 3350-KCL-NA BICARB-NACL 420 G PO SOLR
4000.0000 mL | Freq: Once | ORAL | Status: AC
  Administered 2012-01-05: 4000 mL via ORAL
  Filled 2012-01-05: qty 4000

## 2012-01-05 MED ORDER — BOOST / RESOURCE BREEZE PO LIQD
1.0000 | Freq: Three times a day (TID) | ORAL | Status: DC
  Administered 2012-01-07 – 2012-01-08 (×4): 1 via ORAL

## 2012-01-05 NOTE — Evaluation (Signed)
Occupational Therapy Evaluation Patient Details Name: Peter [name] MRN: [id number] DOB: 12/30/1950 Today's Date: 01/05/2012 Time: 4098-1191 OT Time Calculation (min): 22 min  OT Assessment / Plan / Recommendation Clinical Impression  Pt admitted with fall, rib fractures,aspiration pna in the setting of ETOH abuse.  Pt has poor judgement and safety awareness at baseline.  Cousin wil be staying with pt upon d/c, but not able to continue on an ongoing basis.  Cousin to speak to pt's family about placement vs. return home.  Will follow acutely to address ADL, balance issues, and instruct in use of appropriate DME.  Recommend home with 24 hour care vs. SNF if family unable to provide.    OT Assessment  Patient needs continued OT Services    Follow Up Recommendations  Home health OT;Skilled nursing facility;Supervision/Assistance - 24 hour    Barriers to Discharge      Equipment Recommendations  Rolling walker with 5" wheels;3 in 1 bedside comode;Tub/shower seat    Recommendations for Other Services    Frequency  Min 2X/week    Precautions / Restrictions Precautions Precautions: Fall Restrictions Weight Bearing Restrictions: No Other Position/Activity Restrictions: rib fxs, watch HR with activity   Pertinent Vitals/Pain No c/o pain   ADL  Eating/Feeding: Performed;Set up Where Assessed - Eating/Feeding: Chair Grooming: Performed;Wash/dry hands;Wash/dry face;Set up Where Assessed - Grooming: Unsupported sitting Upper Body Bathing: Simulated;Minimal assistance Where Assessed - Upper Body Bathing: Unsupported sitting Lower Body Bathing: Simulated;Minimal assistance Where Assessed - Lower Body Bathing: Unsupported sit to stand Upper Body Dressing: Performed;Minimal assistance Where Assessed - Upper Body Dressing: Unsupported sitting Lower Body Dressing: Minimal assistance;Performed Where Assessed - Lower Body Dressing: Unsupported sit to stand Toilet Transfer: Performed;Min  guard Toilet Transfer Method: Stand pivot Acupuncturist: Bedside commode Equipment Used: Gait belt ADL Comments: Pt is incontinent of urine at baseline.  Cousin reports he must be told when he needs to change clothes after being incontinent.    OT Diagnosis: Generalized weakness;Cognitive deficits;Blindness and low vision  OT Problem List: Decreased activity tolerance;Impaired balance (sitting and/or standing);Cardiopulmonary status limiting activity;Decreased knowledge of use of DME or AE;Decreased cognition;Decreased safety awareness;Impaired vision/perception OT Treatment Interventions: Self-care/ADL training;DME and/or AE instruction;Cognitive remediation/compensation;Patient/family education;Balance training   OT Goals Acute Rehab OT Goals OT Goal Formulation: With patient Time For Goal Achievement: 01/05/12 Potential to Achieve Goals: Fair ADL Goals Pt Will Perform Grooming: with supervision;Unsupported;Standing at sink ADL Goal: Grooming - Progress: Goal set today Pt Will Perform Upper Body Bathing: with supervision;Sitting in shower ADL Goal: Upper Body Bathing - Progress: Goal set today Pt Will Perform Lower Body Bathing: with supervision;Sitting in shower ADL Goal: Lower Body Bathing - Progress: Goal set today Pt Will Perform Upper Body Dressing: with supervision;Sitting, bed ADL Goal: Upper Body Dressing - Progress: Goal set today Pt Will Perform Lower Body Dressing: with supervision;Sit to stand from bed ADL Goal: Lower Body Dressing - Progress: Goal set today Pt Will Transfer to Toilet: with supervision;with DME;Ambulation;Regular height toilet ADL Goal: Toilet Transfer - Progress: Goal set today Pt Will Perform Toileting - Clothing Manipulation: with supervision;Standing ADL Goal: Toileting - Clothing Manipulation - Progress: Goal set today Pt Will Perform Toileting - Hygiene: with supervision;Leaning right and/or left on 3-in-1/toilet ADL Goal: Toileting -  Hygiene - Progress: Goal set today Pt Will Perform Tub/Shower Transfer: Tub transfer;Ambulation;with DME;Shower seat with back;Transfer tub bench ADL Goal: Web designer - Progress: Goal set today Miscellaneous OT Goals Miscellaneous OT Goal #1: Pt will demonstrate awareness of fall  risk by awaiting assistance prior to OOB activity, use of RW, agreement with need for tub safety equipment and need for 24 hour care. OT Goal: Miscellaneous Goal #1 - Progress: Goal set today  Visit Information  Last OT Received On: 01/05/12 Assistance Needed: +1    Subjective Data  Subjective: "I'm hungry." Patient Stated Goal: Return home with assist of family.   Prior Functioning  Home Living Lives With: Alone Available Help at Discharge: Family;Home health Type of Home: House Home Access: Stairs to enter Secretary/administrator of Steps: 3 Entrance Stairs-Rails: Right Home Layout: One level Bathroom Shower/Tub: Engineer, manufacturing systems: Standard Home Adaptive Equipment: Straight cane Additional Comments: Cousin comes in 2x/day Prior Function Level of Independence: Needs assistance Needs Assistance: Meal Prep;Light Housekeeping Meal Prep: Moderate Light Housekeeping: Maximal Able to Take Stairs?: Yes Driving: No Communication Communication: No difficulties Dominant Hand: Left    Cognition  Overall Cognitive Status: Impaired Area of Impairment: Safety/judgement;Awareness of deficits Arousal/Alertness: Awake/alert Orientation Level: Appears intact for tasks assessed Behavior During Session: [facility] for tasks performed Safety/Judgement: Decreased safety judgement for tasks assessed;Decreased awareness of need for assistance Safety/Judgement - Other Comments: Underestimates falls. Hesitant about equipment needed (shower chair). Awareness of Deficits: Decreased awareness of balance issues and falls risk.    Extremity/Trunk Assessment Right Upper Extremity Assessment RUE  ROM/Strength/Tone: WFL for tasks assessed (tremulous) RUE Sensation: WFL - Light Touch;WFL - Proprioception RUE Coordination: WFL - gross/fine motor Left Upper Extremity Assessment LUE ROM/Strength/Tone: WFL for tasks assessed (tremulous) LUE Sensation: WFL - Light Touch;WFL - Proprioception LUE Coordination: WFL - gross/fine motor Trunk Assessment Trunk Assessment: Normal   Mobility Bed Mobility Bed Mobility: Supine to Sit;Sitting - Scoot to Edge of Bed;Sit to Supine Supine to Sit: 5: Supervision Sitting - Scoot to Edge of Bed: 5: Supervision Transfers Transfers: Sit to Stand;Stand to Sit Sit to Stand: From bed;Without upper extremity assist;4: Min guard Stand to Sit: 4: Min guard;To chair/3-in-1;With upper extremity assist   Exercise    Balance Balance Balance Assessed: Yes Static Sitting Balance Static Sitting - Balance Support: Feet supported Static Sitting - Level of Assistance: 7: Independent  End of Session OT - End of Session Activity Tolerance: Patient tolerated treatment well Patient left: in chair;with call bell/phone within reach;with family/visitor present Nurse Communication: Other (comment) (condom cath came off)  GO     Evern Bio 01/05/2012, 8:56 AM

## 2012-01-05 NOTE — Progress Notes (Signed)
Tried to call report x1 

## 2012-01-05 NOTE — Procedures (Signed)
Objective Swallowing Evaluation: Modified Barium Swallowing Study  Patient Details  Name: Peter [name] MRN: [id number] Date of Birth: August 30, 1950  Today's Date: 01/05/2012 Time: 0930-1010 SLP Time Calculation (min): 40 min  Past Medical History:  Past Medical History  Diagnosis Date  . Hypertension   . Hyperlipemia   . Emphysema   . Stroke   . Chronic pain   . Cirrhosis of liver   . Fall   . Dental injury   . Bone spur of other site     Left Foot  . Impaired mobility   . Total self care deficit   . Pneumonia    Past Surgical History:  Past Surgical History  Procedure Date  . No past surgeries    HPI:  61 year old male admitted 01/03/12.  PMH significant for ETOH, COPD, CVA, HTN, PNA, emphysema.     Assessment / Plan / Recommendation Clinical Impression  Dysphagia Diagnosis: Mild oral phase dysphagia;Mild pharyngeal phase dysphagia Clinical impression: Mr. Christopher demonstrates a mild oral phase dysphagia with multiple attempts to propel bolus posteriorally prior to swallow initiation, apparently associated with anxiety, particularly with pills. Pt with a mild delay in swallow initiation though this did not result in any penetration of aspiration. Due to poor dentition pt may continue a dysphagia 2 diet with thin liquids with minimal risk of aspriation when alert. Discussed increased risk of aspiration when intoxicated with pt.     Treatment Recommendation  No treatment recommended at this time    Diet Recommendation Dysphagia 2 (Fine chop);Thin liquid   Liquid Administration via: Cup;Straw Medication Administration: Whole meds with liquid Supervision: Patient able to self feed Compensations: Slow rate;Small sips/bites Postural Changes and/or Swallow Maneuvers: Seated upright 90 degrees    Other  Recommendations Oral Care Recommendations: Oral care BID   Follow Up Recommendations  None    Frequency and Duration        Pertinent Vitals/Pain NA    SLP Swallow  Goals     General HPI: 61 year old male admitted 01/03/12.  PMH significant for ETOH, COPD, CVA, HTN, PNA, emphysema. Type of Study: Modified Barium Swallowing Study Reason for Referral: Objectively evaluate swallowing function Diet Prior to this Study: Dysphagia 2 (chopped);Thin liquids Temperature Spikes Noted: No Respiratory Status: Room air History of Recent Intubation: No Behavior/Cognition: Alert;Cooperative;Pleasant mood Oral Cavity - Dentition: Poor condition;Missing dentition Oral Motor / Sensory Function: Within functional limits Self-Feeding Abilities: Able to feed self Patient Positioning: Upright in chair Baseline Vocal Quality: Clear Volitional Cough: Strong Volitional Swallow: Able to elicit Anatomy: Within functional limits Pharyngeal Secretions: Not observed secondary MBS    Reason for Referral Objectively evaluate swallowing function   Oral Phase     Pharyngeal Phase Pharyngeal Phase: Impaired   Cervical Esophageal Phase Cervical Esophageal Phase: [facility]    Mai Longnecker, Riley Nearing 01/05/2012, 11:47 AM

## 2012-01-05 NOTE — Progress Notes (Addendum)
Report called to [phone] RN Raoul Pitch. Pt to transfer via wheelchair, IV, no O2, no RN. Family and belongings at bedside and updated concerning transfer. Meds in chart. VS stable upon transfer. No current questions or complaints. Zayna Toste L

## 2012-01-05 NOTE — Progress Notes (Signed)
Utilization Review Completed.  Peter [name] T  01/05/2012  

## 2012-01-05 NOTE — Progress Notes (Signed)
Patient ID: Peter [name], male   DOB: 23-Apr-1951, 61 y.o.   MRN: [id number]    Subjective: Remains on room air, getting ready to work with PT  Objective: Vital signs in last 24 hours: Temp:  [98.7 F (37.1 C)-99.9 F (37.7 C)] 99.1 F (37.3 C) (07/01 0407) Pulse Rate:  [82-134] 134  (07/01 0533) Resp:  [19-21] 19  (07/01 0407) BP: (121-161)/(76-97) 148/90 mmHg (07/01 0533) SpO2:  [95 %-100 %] 97 % (07/01 0407) Weight:  [66 kg (145 lb 8.1 oz)] 66 kg (145 lb 8.1 oz) (07/01 0407) Last BM Date: 01/04/12  Intake/Output from previous day: 06/30 0701 - 07/01 0700 In: 2030 [P.O.:720; I.V.:1000; IV Piggyback:310] Out: 3300 [Urine:3300] Intake/Output this shift:    General appearance: alert and cooperative Resp: clear to auscultation bilaterally Chest wall: left sided chest wall tenderness  Lab Results: CBC   Basename 01/05/12 0345 01/04/12 1700 01/04/12 0410 01/03/12 1727  WBC -- -- 8.4 11.9*  HGB 9.4* 8.7* -- --  HCT 28.1* 25.6* -- --  PLT -- -- 158 190   BMET  Basename 01/04/12 0410 01/03/12 1727 01/03/12 0923  NA 142 -- 141  K 3.2* -- 3.6  CL 105 -- 104  CO2 21 -- --  GLUCOSE 83 -- 116*  BUN 5* -- <3*  CREATININE 0.86 0.81 --  CALCIUM 8.2* -- --   PT/INR  Basename 01/03/12 1727 01/03/12 0925  LABPROT 14.7 13.3  INR 1.13 0.99   ABG No results found for this basename: PHART:2,PCO2:2,PO2:2,HCO3:2 in the last 72 hours  Studies/Results: Dg Ribs Unilateral Left  01/03/2012  *RADIOLOGY REPORT*  Clinical Data: Fall, left chest pain  LEFT RIBS - 2 VIEW  Comparison: None.  Findings: Right lower lobe opacity, incomplete visualized, as described on prior chest radiographs.  Minimally displaced left lateral 5th, 6th, and possibly 7th rib fractures.  IMPRESSION: Minimally displaced left lateral 5th, 6th, and possibly 7th rib fractures.  Original Report Authenticated By: Charline Bills, M.D.   Ct Head Wo Contrast  01/03/2012  *RADIOLOGY REPORT*  Clinical Data: Multiple  falls.  CT HEAD WITHOUT CONTRAST  Technique:  Contiguous axial images were obtained from the base of the skull through the vertex without contrast.  Comparison: None.  Findings: There is diffuse patchy low density throughout the subcortical and periventricular white matter consistent with chronic small vessel ischemic change.  There is prominence of the sulci and ventricles consistent with brain atrophy .  There is no evidence for acute brain infarct, hemorrhage or mass.  There is no evidence for acute brain infarct, hemorrhage or mass.  There is mild mucosal thickening involving the ethmoid air cells and maxillary sinuses.  The mastoid air cells are clear.  The skull is intact.  IMPRESSION:  1.  No acute intracranial abnormalities.  Original Report Authenticated By: Rosealee Albee, M.D.   Ct Abdomen Pelvis W Contrast  01/03/2012  *RADIOLOGY REPORT*  Clinical Data: Dizziness.  History of severe repeated alcohol intoxication.  Multiple falls and syncopal events in recent days. History of trauma from a fall off of a porch.  CT ABDOMEN AND PELVIS WITH CONTRAST  Technique:  Multidetector CT imaging of the abdomen and pelvis was performed following the standard protocol during bolus administration of intravenous contrast.  Contrast: OMNIPAQUE IOHEXOL 300 MG/ML  SOLN  Comparison: No priors.  Findings:  Lung Bases: Extensive air space consolidation in the right middle lobe with multiple air bronchograms.  Patchy ground-glass attenuation and air space consolidation is  present to a much lesser extent within the right lower lobe as well.  Abdomen/Pelvis:  Multiple low attenuation lesions scattered throughout the hepatic parenchyma, measuring up to 9 mm in diameter, all of which are too small to definitively characterize. There is some high attenuation material layering dependently in the lumen of the gallbladder, which likely represents biliary sludge. No gallstones or findings to suggest acute cholecystitis at  this time.  The enhanced appearance of the pancreas, spleen, bilateral adrenal glands and bilateral kidneys is unremarkable.  Normal appendix.  Atherosclerosis in the abdominal and pelvic vasculature, without evidence of aneurysm or dissection.  There is no ascites or pneumoperitoneum and no pathologic distension of bowel.  No definite pathologic lymphadenopathy identified within the abdomen or pelvis.  The colon is nearly completely decompressed which results in apparent diffuse colonic wall thickening.  This is favored to be secondary to under distension, rather than an underlying pathologic state.  Prostate and urinary bladder are unremarkable in appearance.  Musculoskeletal: There are no aggressive appearing lytic or blastic lesions noted in the visualized portions of the skeleton.  IMPRESSION: 1.  Extensive airspace consolidation in the right middle lobe, and to a lesser extent in the right lower lobe, concerning for pneumonia.  Given the patient's history, this may represent sequelae of aspiration. There is also a small right-sided pleural effusion layering dependently. 2. Biliary sludge within the gallbladder, without evidence of acute cholecystitis. 3.  Numerous low attenuation lesions scattered throughout the hepatic parenchyma are too small to definitively characterize. These are statistically favored to represent small cysts or biliary hamartomas.  This would require MRI for further characterization if clinically indicated. 4.  Normal appendix. 5.  Atherosclerosis.  Original Report Authenticated By: Florencia Reasons, M.D.   Dg Chest Port 1 View  01/04/2012  *RADIOLOGY REPORT*  Clinical Data: Chest pain  PORTABLE CHEST - 1 VIEW  Comparison:   the previous day's study  Findings: There is more dense airspace consolidation in the right middle lobe.  Left lung remains clear.  Heart size normal.  No definite effusion.  Old left clavicle fracture.  IMPRESSION:  1.  Progressive dense airspace consolidation  in the right middle lobe.  Original Report Authenticated By: Osa Craver, M.D.   Dg Chest Portable 1 View  01/03/2012  *RADIOLOGY REPORT*  Clinical Data: Dizziness  PORTABLE CHEST - 1 VIEW  Comparison: 02/12/2011  Findings: The heart size is normal.  No pleural effusion or edema. Dense airspace consolidation within the right lower lobe is identified, new from previous exam.  There are no displaced rib fractures identified.  IMPRESSION:  1.  Dense airspace consolidation in the right base.  In the acute setting this is most likely due to pneumonia. If there is a history of trauma and no symptoms of pneumonia then this could represent an area of pulmonary contusion.  If the patient is asymptomatic then these findings would be concerning for a pulmonary mass.  Careful clinical correlation and close interval radiographic followup is advised.  Original Report Authenticated By: Rosealee Albee, M.D.   Dg Foot Complete Right  01/03/2012  *RADIOLOGY REPORT*  Clinical Data: Fall, foot pain  RIGHT FOOT COMPLETE - 3+ VIEW  Comparison: None.  Findings: No fracture or dislocation is seen.  The joint spaces are preserved.  The visualized soft tissues are unremarkable.  IMPRESSION: No fracture or dislocation is seen.  Original Report Authenticated By: Charline Bills, M.D.    Anti-infectives: Anti-infectives  Start     Dose/Rate Route Frequency Ordered Stop   01/03/12 1730  piperacillin-tazobactam (ZOSYN) IVPB 3.375 g       3.375 g 12.5 mL/hr over 240 Minutes Intravenous Every 8 hours 01/03/12 1614     01/03/12 1700   piperacillin-tazobactam (ZOSYN) IVPB 3.375 g  Status:  Discontinued        3.375 g 100 mL/hr over 30 Minutes Intravenous  Once 01/03/12 1555 01/03/12 1618   01/03/12 1615   moxifloxacin (AVELOX) IVPB 400 mg  Status:  Discontinued        400 mg 250 mL/hr over 60 Minutes Intravenous Every 24 hours 01/03/12 1613 01/03/12 1717   01/03/12 1030   cefTRIAXone (ROCEPHIN) 1 g in dextrose  5 % 50 mL IVPB        1 g 100 mL/hr over 30 Minutes Intravenous  Once 01/03/12 1023 01/03/12 1207   01/03/12 1030   azithromycin (ZITHROMAX) 500 mg in dextrose 5 % 250 mL IVPB        500 mg 250 mL/hr over 60 Minutes Intravenous  Once 01/03/12 1023 01/03/12 1314          Assessment/Plan: s/p Procedure(s): COLONOSCOPY WITH ESOPHAGOGASTRODUODENOSCOPY (EGD) Fall L rib Fx 5-7 - improved with bronchodilators and pulmonary toilet PNA and other medical issues per primary team Recall trauma PRN I spoke to his cousin and family has arranged 24 hour coverage for when patient is D/C  LOS: 2 days    Violeta Gelinas, MD, MPH, FACS Pager: [phone]  01/05/2012

## 2012-01-05 NOTE — Progress Notes (Signed)
Pt oriented to room and call bell. Placed in camera room and bed alarm turned on sensitive. IVF at [facility]. Skin intact. Pt alert and oriented, but has a tendency to get out of bed.

## 2012-01-05 NOTE — Progress Notes (Signed)
[facility] PROGRESS NOTE  Peter [name] ZOX:[id number] DOB: 03-16-1951 DOA: 01/03/2012 PCP: No primary provider on file.  Assessment/Plan:  1. Aspiration Pneumonia: -started him on zosyn  -aspiration precautions  -SLP evaluation - dysphagia 2 diet.  -pulmonary toilet  -bronchodilators  -nasal oxygen as required  -sputum cultures pending -urine for legionella antigen and streptococcal antigen pending. 2. RIb fractures:  - pain control  -incentive spirometry  -repeat CXR for evaluation of pulm contusion on Tuesday. - trauma surgery input appreciated. 3. Alcohol abuse:  - education  -ciwa protocol  - iv fluids with thiamine and folic acid.  No withdrawal effects will transfer out of SDU to floor. -CCSW consult  4. H/o Multiple falls at home:  eval for syncope  -on tele for evaluation of arrhythmias  -2 d echocardiogram  -enzymes negative. 5, Encephalopathic secondary to alcohol intoxication  - resolved. -fall precautions  -DT precautions  -ammonia level normal.  - tremors in his hands, probably from withdrawal effect. 6. COPD:  NEBS AS NEEDED.  7. Diarrhea:  - with h/o recent clindamycin use,  c diff ruled out.   Stool culture.  Fever/tachycardia: resolved. -possibly from pneumonia and dehydration from diarrhea.  guiac positive stools:  He has h/o cirrhosis of liver, will need to evaluate for variceal bleeding.  - pt sees Dr hung as outpatient , called GI consult. Going for egd and Colonoscopy. Hemoglobin dropped from admission probably from hemodilution, no active bleeding.  -Q12H&H  PPI.  ANEMIA: anemia panel ordered. Possibly AOCD.  Elevated Liver enzymes:  -improving. Possibly from alcohol abuse and cirrhosis of liver  Elevated lipase, possibly mild pancreatitis .   Principal Problem:  *Aspiration pneumonia Active Problems:  Alcohol withdrawal  COPD (chronic obstructive pulmonary disease)  CAP (community acquired pneumonia)  Anemia  Elevated  liver enzymes  Hypokalemia  Syncope  Multiple rib fractures  Leukocytosis  Hyperlipidemia  Code Status: full code Family Communication: spoke to the family at bedside Disposition Plan: PENDING PT /OT evaluation  Peter [name]  [facility] Pager [phone]  If 7PM-7AM, please contact night-coverage [email or web address] Password [email or web address] 01/05/2012, 10:44 AM   LOS: 2 days   Brief narrative: 61 year old gentleman brought by his cousin, for multiple falls over the last few weeks, nausea, vomiting, he was found to have rib fractures, possibly aspiration pneumonia,  Consultants:  Dr Elnoria Howard called for Gastroenterology  Procedures:  CXR  Antibiotics:  Zosyn.  HPI/Subjective: Reports he is feeling better than yesterday.   Objective: Filed Vitals:   01/05/12 0407 01/05/12 0533 01/05/12 0826 01/05/12 0900  BP: 161/85 148/90  132/85  Pulse: 91 134  116  Temp: 99.1 F (37.3 C)   98 F (36.7 C)  TempSrc: Oral   Oral  Resp: 19   23  Height:      Weight: 66 kg (145 lb 8.1 oz)     SpO2: 97%  99% 98%    Intake/Output Summary (Last 24 hours) at 01/05/12 1044 Last data filed at 01/05/12 0408  Gross per 24 hour  Intake    980 ml  Output   3300 ml  Net  -2320 ml    Exam:   General:  Alert, low grade temp. comfortable  Cardiovascular:  s1s2 RRR  Respiratory: decreased air entry on the right side. Scattered rhonchi  Abdomen: soft NT ND bs+  Extremities: no pedal edema, cyanosis or clubbing.   Data Reviewed: Basic Metabolic Panel:  Lab 01/04/12 8295 01/03/12 1727 01/03/12 0923  NA 142 --  141  K 3.2* -- 3.6  CL 105 -- 104  CO2 21 -- --  GLUCOSE 83 -- 116*  BUN 5* -- <3*  CREATININE 0.86 0.81 0.90  CALCIUM 8.2* -- --  MG 1.4* -- --  PHOS 3.6 -- --   Liver Function Tests:  Lab 01/04/12 0410 01/03/12 0905  AST 55* 106*  ALT 31 50  ALKPHOS 88 122*  BILITOT 0.4 0.3  PROT 6.4 8.4*  ALBUMIN 2.6* 3.6    Lab 01/04/12 1110 01/03/12 0905  LIPASE 79*  77*  AMYLASE -- --    Lab 01/03/12 1721  AMMONIA 37   CBC:  Lab 01/05/12 0345 01/04/12 1700 01/04/12 0410 01/03/12 1727 01/03/12 0923 01/03/12 0905  WBC -- -- 8.4 11.9* -- 9.8  NEUTROABS -- -- -- -- -- 8.5*  HGB 9.4* 8.7* 8.7* 9.8* 12.2* --  HCT 28.1* 25.6* 25.9* 29.5* 36.0* --  MCV -- -- 79.0 78.9 -- 79.2  PLT -- -- 158 190 -- 203   Cardiac Enzymes:  Lab 01/03/12 0851  CKTOTAL --  CKMB --  CKMBINDEX --  TROPONINI <0.30   BNP (last 3 results) No results found for this basename: PROBNP:3 in the last 8760 hours CBG: No results found for this basename: GLUCAP:5 in the last 168 hours  Recent Results (from the past 240 hour(s))  MRSA PCR SCREENING     Status: Normal   Collection Time   01/03/12  4:46 PM      Component Value Range Status Comment   MRSA by PCR NEGATIVE  NEGATIVE Final   CLOSTRIDIUM DIFFICILE BY PCR     Status: Normal   Collection Time   01/03/12 11:06 PM      Component Value Range Status Comment   C difficile by pcr NEGATIVE  NEGATIVE Final   CULTURE, EXPECTORATED SPUTUM-ASSESSMENT     Status: Normal   Collection Time   01/04/12  6:17 AM      Component Value Range Status Comment   Specimen Description SPUTUM   Final    Special Requests Normal   Final    Sputum evaluation     Final    Value: MICROSCOPIC FINDINGS SUGGEST THAT THIS SPECIMEN IS NOT REPRESENTATIVE OF LOWER RESPIRATORY SECRETIONS. PLEASE RECOLLECT.     CALLED TO A.PETTIFORD,RN 1610 01/04/12 BY M.CAMPBELL   Report Status 01/04/2012 FINAL   Final   CULTURE, EXPECTORATED SPUTUM-ASSESSMENT     Status: Normal   Collection Time   01/04/12  4:13 PM      Component Value Range Status Comment   Specimen Description SPUTUM   Final    Special Requests Normal   Final    Sputum evaluation     Final    Value: MICROSCOPIC FINDINGS SUGGEST THAT THIS SPECIMEN IS NOT REPRESENTATIVE OF LOWER RESPIRATORY SECRETIONS. PLEASE RECOLLECT.     CALLED TO KAISER,ASHLEY RN 01/04/12 1709 WOOTEN,K   Report Status  01/04/2012 FINAL   Final      Studies: Dg Ribs Unilateral Left  01/03/2012  *RADIOLOGY REPORT*  Clinical Data: Fall, left chest pain  LEFT RIBS - 2 VIEW  Comparison: None.  Findings: Right lower lobe opacity, incomplete visualized, as described on prior chest radiographs.  Minimally displaced left lateral 5th, 6th, and possibly 7th rib fractures.  IMPRESSION: Minimally displaced left lateral 5th, 6th, and possibly 7th rib fractures.  Original Report Authenticated By: Charline Bills, M.D.   Dg Chest Port 1 View  01/04/2012  *RADIOLOGY REPORT*  Clinical Data: Chest  pain  PORTABLE CHEST - 1 VIEW  Comparison:   the previous day's study  Findings: There is more dense airspace consolidation in the right middle lobe.  Left lung remains clear.  Heart size normal.  No definite effusion.  Old left clavicle fracture.  IMPRESSION:  1.  Progressive dense airspace consolidation in the right middle lobe.  Original Report Authenticated By: Osa Craver, M.D.   Dg Foot Complete Right  01/03/2012  *RADIOLOGY REPORT*  Clinical Data: Fall, foot pain  RIGHT FOOT COMPLETE - 3+ VIEW  Comparison: None.  Findings: No fracture or dislocation is seen.  The joint spaces are preserved.  The visualized soft tissues are unremarkable.  IMPRESSION: No fracture or dislocation is seen.  Original Report Authenticated By: Charline Bills, M.D.    Scheduled Meds:    . albuterol  2.5 mg Nebulization Q6H  . aspirin EC  81 mg Oral Daily  . ferrous sulfate  325 mg Oral Q breakfast  . folic acid  1 mg Oral Daily  . ipratropium  0.5 mg Nebulization Q6H  . LORazepam  0-4 mg Intravenous Q6H   Followed by  . LORazepam  0-4 mg Intravenous Q12H  . magnesium sulfate 1 - 4 g bolus IVPB  2 g Intravenous Once  . multivitamin with minerals  1 tablet Oral Daily  . pantoprazole (PROTONIX) IV  40 mg Intravenous Q12H  . piperacillin-tazobactam (ZOSYN)  IV  3.375 g Intravenous Q8H  . pneumococcal 23 valent vaccine  0.5 mL  Intramuscular Tomorrow-1000  . potassium chloride  10 mEq Intravenous Q1 Hr x 2  . potassium chloride  40 mEq Oral BID  . simvastatin  20 mg Oral q1800  . sodium chloride  3 mL Intravenous Q12H  . thiamine  100 mg Oral Daily   Or  . thiamine  100 mg Intravenous Daily   Continuous Infusions:

## 2012-01-05 NOTE — Progress Notes (Signed)
Patient's sister requested for phone to be removed from room until his wife comes in the morning.  Patient's sister states that his "drinking buddies" keep calling and disturbing him making him want to go home.  Removed phone from room, patient stated understanding. Will continue to monitor patient. Peter [name]

## 2012-01-05 NOTE — Progress Notes (Signed)
Physical Therapy Treatment Patient Details Name: Peter [name] MRN: [id number] DOB: 05-18-1951 Today's Date: 01/05/2012 Time: 1478-2956 PT Time Calculation (min): 12 min  PT Assessment / Plan / Recommendation Comments on Treatment Session  Patient s/p fall with rib fractures, and aspiration PNA.  PAtient is progressing.  Continues to need 24 hour care due to decr stability and poor judgment and safety with mobility.      Follow Up Recommendations  Home health PT;Supervision for mobility/OOB    Barriers to Discharge  None      Equipment Recommendations  Rolling walker with 5" wheels;3 in 1 bedside comode;Tub/shower seat    Recommendations for Other Services  None  Frequency Min 3X/week   Plan Discharge plan remains appropriate;Frequency remains appropriate    Precautions / Restrictions Precautions Precautions: Fall Restrictions Weight Bearing Restrictions: No   Pertinent Vitals/Pain HR incr with exercise, No pain    Mobility  Bed Mobility Bed Mobility: Rolling Left;Left Sidelying to Sit;Sitting - Scoot to Edge of Bed Rolling Left: 5: Supervision Left Sidelying to Sit: 5: Supervision Supine to Sit: 5: Supervision Sitting - Scoot to Edge of Bed: 5: Supervision Sit to Supine: Not Tested (comment) Details for Bed Mobility Assistance: verbal cues for safety Transfers Transfers: Sit to Stand;Stand to Sit Sit to Stand: 4: Min guard;With upper extremity assist;From bed Stand to Sit: 4: Min guard;With upper extremity assist;To chair/3-in-1;With armrests Details for Transfer Assistance: verbal cues for hand placement.  Poor safety awareness.   Ambulation/Gait Ambulation/Gait Assistance: 4: Min assist (+1 for lines) Ambulation Distance (Feet): 150 Feet Assistive device: Rolling walker Ambulation/Gait Assistance Details: cues for safe use of RW.  Unsteady gait at times due to foot placement, patient maintains somewhat narrow BOS.   Gait Pattern: Narrow base of support;Step-through  pattern;Decreased stride length;Ataxic;Trunk flexed Gait velocity: Slow gait speed Stairs: No Wheelchair Mobility Wheelchair Mobility: No    Exercises General Exercises - Lower Extremity Long Arc Quad: AROM;Both;15 reps;Seated Hip Flexion/Marching: AROM;Both;15 reps;Seated    PT Goals Acute Rehab PT Goals PT Goal: Supine/Side to Sit - Progress: Progressing toward goal PT Goal: Sit to Stand - Progress: Progressing toward goal PT Goal: Stand to Sit - Progress: Progressing toward goal PT Goal: Ambulate - Progress: Progressing toward goal  Visit Information  Last PT Received On: 01/05/12 Assistance Needed: +1    Subjective Data  Subjective: "Everyone is coming in and doing stuff with me."   Cognition  Overall Cognitive Status: Impaired Area of Impairment: Safety/judgement;Awareness of deficits Arousal/Alertness: Awake/alert Orientation Level: Appears intact for tasks assessed Behavior During Session: [facility] for tasks performed Safety/Judgement: Decreased safety judgement for tasks assessed;Decreased awareness of need for assistance    Balance  Static Sitting Balance Static Sitting - Balance Support: Bilateral upper extremity supported;Feet supported Static Sitting - Level of Assistance: 6: Modified independent (Device/Increase time) Static Sitting - Comment/# of Minutes: 4  End of Session PT - End of Session Equipment Utilized During Treatment: Gait belt Activity Tolerance: Patient tolerated treatment well Patient left: in chair;with call bell/phone within reach;with family/visitor present Nurse Communication: Mobility status       INGOLD,Tymesha Ditmore 01/05/2012, 2:02 PM  [facility] Acute Rehabilitation [phone] (pager)

## 2012-01-05 NOTE — Progress Notes (Signed)
INITIAL ADULT NUTRITION ASSESSMENT Date: 01/05/2012   Time: 12:30 PM  Reason for Assessment: MD Consult + Nutrition Risk Report  ASSESSMENT: Male 61 y.o.  Dx: Aspiration pneumonia  Hx:  Past Medical History  Diagnosis Date  . Hypertension   . Hyperlipemia   . Emphysema   . Stroke   . Chronic pain   . Cirrhosis of liver   . Fall   . Dental injury   . Bone spur of other site     Left Foot  . Impaired mobility   . Total self care deficit   . Pneumonia    Past Surgical History  Procedure Date  . No past surgeries     Related Meds:     . albuterol  2.5 mg Nebulization Q6H  . aspirin EC  81 mg Oral Daily  . ferrous sulfate  325 mg Oral Q breakfast  . folic acid  1 mg Oral Daily  . ipratropium  0.5 mg Nebulization Q6H  . LORazepam  0-4 mg Intravenous Q6H   Followed by  . LORazepam  0-4 mg Intravenous Q12H  . magnesium sulfate 1 - 4 g bolus IVPB  2 g Intravenous Once  . multivitamin with minerals  1 tablet Oral Daily  . pantoprazole (PROTONIX) IV  40 mg Intravenous Q12H  . piperacillin-tazobactam (ZOSYN)  IV  3.375 g Intravenous Q8H  . polyethylene glycol-electrolytes  4,000 mL Oral Once  . potassium chloride  10 mEq Intravenous Q1 Hr x 2  . potassium chloride  40 mEq Oral BID  . simvastatin  20 mg Oral q1800  . sodium chloride  3 mL Intravenous Q12H  . thiamine  100 mg Oral Daily   Or  . thiamine  100 mg Intravenous Daily   Ht: 5\' 7"  (170.2 cm)  Wt: 145 lb 8.1 oz (66 kg)  Ideal Wt: 67.3 kg % Ideal Wt: 98%  Usual Wt: 145 - 147 lb (per pt) % Usual Wt: 100%  Body mass index is 22.79 kg/(m^2). WNL  Food/Nutrition Related Hx: alcoholic, limited PO intake of foods 2/2 alcoholism and poor dentition  Labs:  CMP     Component Value Date/Time   NA 134* 01/05/2012 1108   K 3.9 01/05/2012 1108   CL 98 01/05/2012 1108   CO2 24 01/05/2012 1108   GLUCOSE 105* 01/05/2012 1108   BUN 5* 01/05/2012 1108   CREATININE 0.79 01/05/2012 1108   CALCIUM 8.8 01/05/2012 1108   PROT 6.4  01/04/2012 0410   ALBUMIN 2.6* 01/04/2012 0410   AST 55* 01/04/2012 0410   ALT 31 01/04/2012 0410   ALKPHOS 88 01/04/2012 0410   BILITOT 0.4 01/04/2012 0410   GFRNONAA >90 01/05/2012 1108   GFRAA >90 01/05/2012 1108   Magnesium  Date/Time Value Range Status  01/04/2012  4:10 AM 1.4* 1.5 - 2.5 mg/dL Final   Lipase  Date/Time Value Range Status  01/04/2012 11:10 AM 79* 11 - 59 U/L Final   Phosphorus  Date/Time Value Range Status  01/04/2012  4:10 AM 3.6  2.3 - 4.6 mg/dL Final    Intake/Output Summary (Last 24 hours) at 01/05/12 1232 Last data filed at 01/05/12 1200  Gross per 24 hour  Intake  737.5 ml  Output   3300 ml  Net -2562.5 ml    Diet Order: Clear Liquid  Supplements/Tube Feeding: folic acid and thiamine supplementation  IVF:    Estimated Nutritional Needs:   Kcal:  1650 - 1800 kcal Protein:  70 - 80  grams Fluid: 1.8 - 2 liters daily  Pt admitted for multiple falls, n/v. Avid alcohol user. Has ongoing tremors and diarrhea.  Work-up reveals R-sided PNA. BSE completed yesterday recommending MBSS. MBSS today recommending Dysphagia 2 diet with thin liquids.  GI saw pt on 6/30 secondary to hematochezia and further work up revealed heme positive stool and an anemia, recommending EGD and colonoscopy. Currently on clear liquids. Pt states that he had some solid foods yesterday and was able to tolerate well.  Pt reports that his intake of food is fairly limited. Pt states that sometimes he just eats rice and gravy for lunch and dinner. States that he doesn't eat much meat 2/2 poor dentition. Asked pt if he eats eggs, beans or peanut butter and pt states "yeah, I eat those." States he drinks only water or beer. Typically drinks 2 - 40 oz beers daily. This provides at least 1000 kcal daily. Noted pt with very minimal protein intake. Pt is at nutrition risk given very poor protein intake and alcohol abuse.  Pt states that someone put Boost or Ensure in his refrigerator and he drinks it  from time to time. Unable to tell this RD what flavor it is.  NUTRITION DIAGNOSIS: -Inadequate protein - energy intake  RELATED TO: alcoholism and poor nutrition quality of life  AS EVIDENCE BY: dietary recall of very limited protein intake  MONITORING/EVALUATION(Goals): Goal: Pt to consume at least 90% of estimated protein and kcal needs Monitor: PO intake, weights, labs, I/O's  EDUCATION NEEDS: -Education needs addressed  INTERVENTION: 1. Discussed importance of protein intake with each meal time 2. Resource Breeze po TID, each supplement provides 250 kcal and 9 grams of protein. 3. RD to continue to follow nutrition care plan   Dietitian #: [phone]  DOCUMENTATION CODES Per approved criteria  -Not Applicable    Adair Laundry 01/05/2012, 12:30 PM

## 2012-01-06 ENCOUNTER — Encounter (HOSPITAL_COMMUNITY)
Admission: EM | Disposition: A | Discharge status: Home Health | Payer: Self-pay | Source: Home / Self Care | Attending: Internal Medicine

## 2012-01-06 LAB — MRSA PCR SCREENING: MRSA by PCR: NEGATIVE

## 2012-01-06 SURGERY — COLONOSCOPY
Anesthesia: Moderate Sedation

## 2012-01-06 MED ORDER — VITAMIN B-1 100 MG PO TABS: 100.0000 mg | ORAL_TABLET | Freq: Every day | ORAL | Status: DC

## 2012-01-06 MED ORDER — NICOTINE 21 MG/24HR TD PT24
21.0000 mg | MEDICATED_PATCH | Freq: Every day | TRANSDERMAL | Status: DC
  Administered 2012-01-06 – 2012-01-08 (×3): 21 mg via TRANSDERMAL
  Filled 2012-01-06 (×4): qty 1

## 2012-01-06 MED ORDER — ADULT MULTIVITAMIN W/MINERALS CH: 1.0000 | ORAL_TABLET | Freq: Every day | ORAL | Status: DC

## 2012-01-06 MED ORDER — THIAMINE HCL 100 MG/ML IJ SOLN: 100.0000 mg | Freq: Every day | INTRAMUSCULAR | Status: DC

## 2012-01-06 MED ORDER — LORAZEPAM 2 MG/ML IJ SOLN
2.0000 mg | INTRAMUSCULAR | Status: DC | PRN
  Administered 2012-01-06 – 2012-01-07 (×3): 2 mg via INTRAVENOUS
  Filled 2012-01-06 (×3): qty 1

## 2012-01-06 MED ORDER — LORAZEPAM 2 MG/ML IJ SOLN
2.0000 mg | Freq: Once | INTRAMUSCULAR | Status: AC
  Administered 2012-01-06: 2 mg via INTRAVENOUS

## 2012-01-06 MED ORDER — FOLIC ACID 1 MG PO TABS: 1.0000 mg | ORAL_TABLET | Freq: Every day | ORAL | Status: DC

## 2012-01-06 MED ORDER — LORAZEPAM 2 MG/ML IJ SOLN: 1.0000 mg | Freq: Four times a day (QID) | INTRAMUSCULAR | Status: DC | PRN

## 2012-01-06 MED ORDER — LORAZEPAM 2 MG/ML IJ SOLN
2.0000 mg | INTRAMUSCULAR | Status: DC | PRN
  Administered 2012-01-06 (×2): 2 mg via INTRAVENOUS
  Filled 2012-01-06 (×2): qty 1

## 2012-01-06 MED ORDER — LORAZEPAM 1 MG PO TABS: 1.0000 mg | ORAL_TABLET | Freq: Four times a day (QID) | ORAL | Status: DC | PRN

## 2012-01-06 MED ORDER — ALBUTEROL SULFATE HFA 108 (90 BASE) MCG/ACT IN AERS
2.0000 | INHALATION_SPRAY | Freq: Four times a day (QID) | RESPIRATORY_TRACT | Status: DC
  Administered 2012-01-06: 2 via RESPIRATORY_TRACT
  Filled 2012-01-06 (×2): qty 6.7

## 2012-01-06 NOTE — Progress Notes (Signed)
Dr Blake Divine to see patient. Patient to be transferred to stepdown when bed available. Madelin Rear RN, CMSRN

## 2012-01-06 NOTE — Progress Notes (Signed)
Paged Dr Blake Divine updated her on patient's ongoing agitation, elevated BP and HR. Advised MD sitter at bedside along with patient's family but patient remains agitated. Verbal order given for IV Ativan every 2 hours PRN for agitation and to reassess possible need for continuous drip in a couple of hours.

## 2012-01-06 NOTE — Progress Notes (Signed)
Patient's family (mother, brother and sisters) updated at the time of transfer the reason for stepdown bed. Also informed purpose of safety mittens and sitter. Family very understanding and helpful.

## 2012-01-06 NOTE — Progress Notes (Signed)
Clinical Social Work  Aware of consult for alcohol cessation and rehab. Patient currently agitated and going through DT's.  Patient unable to be counseled at this point, however will return once patient is more appropriate to assess. Will follow up.  Ashley Jacobs, MSW LCSW [phone]

## 2012-01-06 NOTE — Progress Notes (Signed)
Pt arrived to unit appearing to be agitated and confused.  Georgie Chard, Endo Tech assisted in attempting to transport pt to bed from w/c.  Pt refused to move once he stood from chair.  Coached pt to sit on bed.  Pt informed he wanted to leave and did not want to sit.  Pt began to push at staff but eventually tired and sat on bed.  Once on bed pt continually stated that he was not going to lay down and pt would not lay back or stay still long enough to obtain vital signs.  Pt became impatient and frustrated and insisted on getting out of bed.  Called Dr. Elnoria Howard, informed of pt's status.  Per Dr. Elnoria Howard, case to be cancelled and pt returned to his room.  Followed through - pt returned to his room.

## 2012-01-06 NOTE — Progress Notes (Signed)
[facility] PROGRESS NOTE  Peter [name] ZOX:[id number] DOB: 1951-04-10 DOA: 01/03/2012 PCP: No primary provider on file.  Assessment/Plan:  1. Aspiration Pneumonia: -started him on zosyn  -aspiration precautions  -SLP evaluation - dysphagia 2 diet.  -pulmonary toilet  -bronchodilators  -nasal oxygen as required  -sputum cultures pending -urine for legionella antigen and streptococcal antigen negative. 2. RIb fractures:  - pain control  -incentive spirometry  -repeat CXR shows resolution of the pneumonia. - trauma surgery input appreciated. 3. Alcohol withdrawal/ DT's: - patient went into DT'S this afternoon, with tremors, delirious, elevated blood pressure, tachycardic, trying to get out of bed, after he was wheeled downstairs for the endo scopy and colonoscopy. The procedure was cancelled to be scheduled when he is stable. Transfer the patient back to SDU. ATivan 2mg  IV every 15 minutes atleast 4 doses, if he doesn't improve, will put the patient on continuous drip and call PCCM.  -ciwa protocol  - iv fluids -CSW consult  4. H/o Multiple falls at home:  eval for syncope  -on tele for evaluation of arrhythmias, TELE monitor showed sinus tachycardia without any arrythmias  -2 d echocardiogram was not significant . -enzymes negative. 5, Encephalopathic secondary to alcohol withdrawal. -fall precautions  -DT precautions  -ammonia level normal.  - tremors in his hands, probably from withdrawal effect. 6. COPD:  NEBS AS NEEDED.  7. Diarrhea:  - resolved. c diff ruled out.   Stool culture.  Fever/tachycardia: resolved. -possibly from pneumonia and dehydration from diarrhea.  guiac positive stools:  - GI consult obtained. Unable to do the EGD/ colonoscopy as he was agitated, procedures cancelled. Please called Dr Elnoria Howard when the patient is stable.  Hemoglobin dropped from admission probably from hemodilution, no active bleeding.  PPI.  ANEMIA: anemia panel ordered.  Possibly AOCD.  Elevated Liver enzymes:  -improving. Possibly from alcohol abuse and cirrhosis of liver  Elevated lipase, possibly mild pancreatitis . No abdominal pain or nausea.   Discussed with family at bedside the plan of care.   Principal Problem:  *Aspiration pneumonia Active Problems:  Alcohol withdrawal  COPD (chronic obstructive pulmonary disease)  CAP (community acquired pneumonia)  Anemia  Elevated liver enzymes  Hypokalemia  Syncope  Multiple rib fractures  Leukocytosis  Hyperlipidemia  Code Status: full code Family Communication: spoke to the family at bedside Disposition Plan: PENDING PT /OT evaluation  Lenyx Boody, MD  [facility] Pager [phone]  If 7PM-7AM, please contact night-coverage [email or web address] Password [email or web address] 01/06/2012, 3:04 PM   LOS: 3 days   Brief narrative: 61 year old gentleman brought by his cousin, for multiple falls over the last few weeks, nausea, vomiting, he was found to have rib fractures, possibly aspiration pneumonia,  Consultants:  Dr Elnoria Howard called for Gastroenterology  Procedures:  CXR  Antibiotics:  Zosyn.  HPI/Subjective: Reports he is feeling better than yesterday.   Objective: Filed Vitals:   01/06/12 0133 01/06/12 0614 01/06/12 1133 01/06/12 1324  BP:  125/66 129/74 138/90  Pulse:  97 101 120  Temp:  98.7 F (37.1 C) 98.7 F (37.1 C)   TempSrc:  Oral Oral   Resp:  20 20   Height:      Weight:      SpO2: 100% 98% 99%     Intake/Output Summary (Last 24 hours) at 01/06/12 1504 Last data filed at 01/06/12 8295  Gross per 24 hour  Intake     60 ml  Output   1200 ml  Net  -1140 ml  Exam:   General:  Alert, afebrile , agitated.  Cardiovascular:  S1s2, tachycardic.  Respiratory: decreased air entry on the right side. Scattered rhonchi  Abdomen: soft NT ND bs+  Extremities: no pedal edema, cyanosis or clubbing.   Data Reviewed: Basic Metabolic Panel:  Lab 01/05/12 1610  01/04/12 0410 01/03/12 1727 01/03/12 0923  NA 134* 142 -- 141  K 3.9 3.2* -- 3.6  CL 98 105 -- 104  CO2 24 21 -- --  GLUCOSE 105* 83 -- 116*  BUN 5* 5* -- <3*  CREATININE 0.79 0.86 0.81 0.90  CALCIUM 8.8 8.2* -- --  MG -- 1.4* -- --  PHOS -- 3.6 -- --   Liver Function Tests:  Lab 01/04/12 0410 01/03/12 0905  AST 55* 106*  ALT 31 50  ALKPHOS 88 122*  BILITOT 0.4 0.3  PROT 6.4 8.4*  ALBUMIN 2.6* 3.6    Lab 01/04/12 1110 01/03/12 0905  LIPASE 79* 77*  AMYLASE -- --    Lab 01/03/12 1721  AMMONIA 37   CBC:  Lab 01/05/12 1730 01/05/12 0345 01/04/12 1700 01/04/12 0410 01/03/12 1727 01/03/12 0905  WBC -- -- -- 8.4 11.9* 9.8  NEUTROABS -- -- -- -- -- 8.5*  HGB 10.5* 9.4* 8.7* 8.7* 9.8* --  HCT 30.9* 28.1* 25.6* 25.9* 29.5* --  MCV -- -- -- 79.0 78.9 79.2  PLT -- -- -- 158 190 203   Cardiac Enzymes:  Lab 01/03/12 0851  CKTOTAL --  CKMB --  CKMBINDEX --  TROPONINI <0.30   BNP (last 3 results) No results found for this basename: PROBNP:3 in the last 8760 hours CBG:  Lab 01/06/12 1446  GLUCAP 96    Recent Results (from the past 240 hour(s))  MRSA PCR SCREENING     Status: Normal   Collection Time   01/03/12  4:46 PM      Component Value Range Status Comment   MRSA by PCR NEGATIVE  NEGATIVE Final   URINE CULTURE     Status: Normal   Collection Time   01/03/12  6:46 PM      Component Value Range Status Comment   Specimen Description URINE, CLEAN CATCH   Final    Special Requests NONE   Final    Culture  Setup Time 01/04/2012 05:08   Final    Colony Count NO GROWTH   Final    Culture NO GROWTH   Final    Report Status 01/05/2012 FINAL   Final   CLOSTRIDIUM DIFFICILE BY PCR     Status: Normal   Collection Time   01/03/12 11:06 PM      Component Value Range Status Comment   C difficile by pcr NEGATIVE  NEGATIVE Final   STOOL CULTURE     Status: Normal (Preliminary result)   Collection Time   01/03/12 11:06 PM      Component Value Range Status Comment    Specimen Description STOOL   Final    Special Requests NONE   Final    Culture     Final    Value: NO SUSPICIOUS COLONIES, CONTINUING TO HOLD     Note: REDUCED NORMAL FLORA PRESENT   Report Status PENDING   Incomplete   CULTURE, EXPECTORATED SPUTUM-ASSESSMENT     Status: Normal   Collection Time   01/04/12  6:17 AM      Component Value Range Status Comment   Specimen Description SPUTUM   Final    Special Requests Normal  Final    Sputum evaluation     Final    Value: MICROSCOPIC FINDINGS SUGGEST THAT THIS SPECIMEN IS NOT REPRESENTATIVE OF LOWER RESPIRATORY SECRETIONS. PLEASE RECOLLECT.     CALLED TO A.PETTIFORD,RN 1610 01/04/12 BY M.CAMPBELL   Report Status 01/04/2012 FINAL   Final   CULTURE, EXPECTORATED SPUTUM-ASSESSMENT     Status: Normal   Collection Time   01/04/12  4:13 PM      Component Value Range Status Comment   Specimen Description SPUTUM   Final    Special Requests Normal   Final    Sputum evaluation     Final    Value: MICROSCOPIC FINDINGS SUGGEST THAT THIS SPECIMEN IS NOT REPRESENTATIVE OF LOWER RESPIRATORY SECRETIONS. PLEASE RECOLLECT.     CALLED TO KAISER,ASHLEY RN 01/04/12 1709 WOOTEN,K   Report Status 01/04/2012 FINAL   Final      Studies: Dg Chest 2 View  01/06/2012  *RADIOLOGY REPORT*  Clinical Data: Cough, shortness of breath.  Evaluate pneumonia.  CHEST - 2 VIEW  Comparison: 01/04/2012  Findings: Improving right middle lobe infiltrate/pneumonia.  Mild opacity persist.  Minimal left base atelectasis.  Heart is normal size.  Small right effusion.  IMPRESSION: Improving right middle lobe pneumonia.  Small right effusion.  Original Report Authenticated By: Cyndie Chime, M.D.   Dg Swallowing Func-no Report  01/05/2012  CLINICAL DATA: objectively assess swallow function and safety   FLUOROSCOPY FOR SWALLOWING FUNCTION STUDY:  Fluoroscopy was provided for swallowing function study, which was  administered by a speech pathologist.  Final results and recommendations  from  this study are contained within the speech pathology report.      Scheduled Meds:    . albuterol  2 puff Inhalation QID  . aspirin EC  81 mg Oral Daily  . feeding supplement  1 Container Oral TID BM  . ferrous sulfate  325 mg Oral Q breakfast  . folic acid  1 mg Oral Daily  . LORazepam  0-4 mg Intravenous Q12H  . LORazepam  2 mg Intravenous Once  . LORazepam  2 mg Intravenous Once  . multivitamin with minerals  1 tablet Oral Daily  . nicotine  21 mg Transdermal Daily  . pantoprazole (PROTONIX) IV  40 mg Intravenous Q12H  . piperacillin-tazobactam (ZOSYN)  IV  3.375 g Intravenous Q8H  . polyethylene glycol-electrolytes  4,000 mL Oral Once  . simvastatin  20 mg Oral q1800  . sodium chloride  3 mL Intravenous Q12H  . thiamine  100 mg Oral Daily   Or  . thiamine  100 mg Intravenous Daily  . DISCONTD: albuterol  2.5 mg Nebulization Q6H  . DISCONTD: folic acid  1 mg Oral Daily  . DISCONTD: ipratropium  0.5 mg Nebulization Q6H  . DISCONTD: multivitamin with minerals  1 tablet Oral Daily  . DISCONTD: thiamine  100 mg Intravenous Daily  . DISCONTD: thiamine  100 mg Oral Daily   Continuous Infusions:

## 2012-01-06 NOTE — Progress Notes (Signed)
ANTIBIOTIC CONSULT NOTE - FOLLOW UP  Pharmacy Consult:  Zosyn Indication: possible aspiration PNA  Allergies  Allergen Reactions  . Poison Ivy Extract (Extract Of Poison Ivy) Rash    Patient Measurements: Height: 5\' 7"  (170.2 cm) Weight: 145 lb 8.1 oz (66 kg) IBW/kg (Calculated) : 66.1   Vital Signs: Temp: 98.7 F (37.1 C) (07/02 0614) Temp src: Oral (07/02 0614) BP: 125/66 mmHg (07/02 0614) Pulse Rate: 97  (07/02 0614) Intake/Output from previous day: 07/01 0701 - 07/02 0700 In: 842.5 [P.O.:720; IV Piggyback:122.5] Out: 1200 [Urine:1200]  Labs:  [facility] 01/05/12 1730 01/05/12 1108 01/05/12 0345 01/04/12 1700 01/04/12 0410 01/03/12 1727 01/03/12 0905  WBC -- -- -- -- 8.4 11.9* 9.8  HGB 10.5* -- 9.4* 8.7* -- -- --  PLT -- -- -- -- 158 190 203  LABCREA -- -- -- -- -- -- --  CREATININE -- 0.79 -- -- 0.86 0.81 --   Estimated Creatinine Clearance: 94 ml/min (by C-G formula based on Cr of 0.79). No results found for this basename: VANCOTROUGH:2,VANCOPEAK:2,VANCORANDOM:2,GENTTROUGH:2,GENTPEAK:2,GENTRANDOM:2,TOBRATROUGH:2,TOBRAPEAK:2,TOBRARND:2,AMIKACINPEAK:2,AMIKACINTROU:2,AMIKACIN:2, in the last 72 hours   Microbiology: Recent Results (from the past 720 hour(s))  MRSA PCR SCREENING     Status: Normal   Collection Time   01/03/12  4:46 PM      Component Value Range Status Comment   MRSA by PCR NEGATIVE  NEGATIVE Final   URINE CULTURE     Status: Normal   Collection Time   01/03/12  6:46 PM      Component Value Range Status Comment   Specimen Description URINE, CLEAN CATCH   Final    Special Requests NONE   Final    Culture  Setup Time 01/04/2012 05:08   Final    Colony Count NO GROWTH   Final    Culture NO GROWTH   Final    Report Status 01/05/2012 FINAL   Final   CLOSTRIDIUM DIFFICILE BY PCR     Status: Normal   Collection Time   01/03/12 11:06 PM      Component Value Range Status Comment   C difficile by pcr NEGATIVE  NEGATIVE Final   STOOL CULTURE     Status:  Normal (Preliminary result)   Collection Time   01/03/12 11:06 PM      Component Value Range Status Comment   Specimen Description STOOL   Final    Special Requests NONE   Final    Culture     Final    Value: NO GROWTH 1 DAY     Note: REDUCED NORMAL FLORA PRESENT   Report Status PENDING   Incomplete   CULTURE, EXPECTORATED SPUTUM-ASSESSMENT     Status: Normal   Collection Time   01/04/12  6:17 AM      Component Value Range Status Comment   Specimen Description SPUTUM   Final    Special Requests Normal   Final    Sputum evaluation     Final    Value: MICROSCOPIC FINDINGS SUGGEST THAT THIS SPECIMEN IS NOT REPRESENTATIVE OF LOWER RESPIRATORY SECRETIONS. PLEASE RECOLLECT.     CALLED TO A.PETTIFORD,RN 0865 01/04/12 BY M.CAMPBELL   Report Status 01/04/2012 FINAL   Final   CULTURE, EXPECTORATED SPUTUM-ASSESSMENT     Status: Normal   Collection Time   01/04/12  4:13 PM      Component Value Range Status Comment   Specimen Description SPUTUM   Final    Special Requests Normal   Final    Sputum evaluation  Final    Value: MICROSCOPIC FINDINGS SUGGEST THAT THIS SPECIMEN IS NOT REPRESENTATIVE OF LOWER RESPIRATORY SECRETIONS. PLEASE RECOLLECT.     CALLED TO KAISER,ASHLEY RN 01/04/12 1709 WOOTEN,K   Report Status 01/04/2012 FINAL   Final         Assessment: 60 YOM to continue on Zosyn for aspiration pneumonia d/t alcoholism.  01/05/12 CXR showed improving RML PNA with small right effusion.  Patient with stable renal function.   6/29: cdiff - negative 6/30 sputum - needs recollection 6/29: urine - negative    Plan:  - Continue Zosyn 3.375gm IV Q8H, 4 hr infusion - Pharmacy will sign off as expect dosage adjustment unnecessary with patient's stable renal function. - Consider d/c Zosyn post 8 days of treatment     Thorne Wirz D. Laney Potash, PharmD, BCPS Pager:  [phone] 01/06/2012, 7:55 AM

## 2012-01-06 NOTE — Progress Notes (Signed)
Pt could not perform the albuterol inhaler to the full effect. Pt. Has a hard time trying to grasp inhaling.

## 2012-01-06 NOTE — Progress Notes (Signed)
Patient was returned to room, patient not cooperative for EGD/Colonoscopy. Upon return, patient very agitated, dr Blake Divine notified, new order for 1mg  ativan IV. Madelin Rear RN, CMSRN

## 2012-01-06 NOTE — Progress Notes (Signed)
Physical Therapy Treatment Patient Details Name: Peter [name] MRN: [id number] DOB: 20-Apr-1951 Today's Date: 01/06/2012 Time: 4098-1191 PT Time Calculation (min): 14 min  PT Assessment / Plan / Recommendation Comments on Treatment Session  Pt s/p fall with rib fractures, and aspiration PNA.  Pt continues to need 24 hour assist due to poor balance and decr cognition.    Follow Up Recommendations  Home health PT;Supervision/Assistance - 24 hour    Barriers to Discharge        Equipment Recommendations  Rolling walker with 5" wheels;3 in 1 bedside comode;Tub/shower seat    Recommendations for Other Services    Frequency Min 3X/week   Plan Discharge plan remains appropriate;Frequency remains appropriate    Precautions / Restrictions Precautions Precautions: Fall Restrictions Weight Bearing Restrictions: No   Pertinent Vitals/Pain Rib pain 8/10    Mobility  Bed Mobility Supine to Sit: 5: Supervision;HOB elevated;With rails Sit to Supine: 5: Supervision;HOB flat Details for Bed Mobility Assistance: verbal cues for safety Transfers Sit to Stand: 4: Min assist;With upper extremity assist;From bed Stand to Sit: 4: Min assist;With upper extremity assist;To bed Details for Transfer Assistance: Pt with posterior lean onto bed coming into standing.  Ambulation/Gait Ambulation/Gait Assistance: 4: Min assist Ambulation Distance (Feet): 150 Feet Assistive device: Rolling walker Ambulation/Gait Assistance Details: verbal cues to maintain attention on task.  Cues to stay closer to walker. Gait Pattern: Narrow base of support;Ataxic    Exercises     PT Diagnosis:    PT Problem List:   PT Treatment Interventions:     PT Goals Acute Rehab PT Goals PT Goal: Supine/Side to Sit - Progress: Progressing toward goal PT Goal: Sit to Supine/Side - Progress: Progressing toward goal PT Goal: Sit to Stand - Progress: Progressing toward goal PT Goal: Stand to Sit - Progress: Progressing toward  goal PT Goal: Ambulate - Progress: Progressing toward goal  Visit Information  Last PT Received On: 01/06/12 Assistance Needed: +1    Subjective Data  Subjective: Pt asking how long I have been here.   Cognition  Overall Cognitive Status: Impaired Area of Impairment: Safety/judgement;Awareness of deficits;Attention Arousal/Alertness: Awake/alert Behavior During Session: Restless Current Attention Level: Focused Attention - Other Comments: Unable to sustain attention in hallway.  Becomes distracted by other people in hall. Safety/Judgement: Decreased safety judgement for tasks assessed;Decreased awareness of safety precautions;Decreased awareness of need for assistance Awareness of Deficits: Unaware of falls and balance issues.    Balance  Static Sitting Balance Static Sitting - Balance Support: Bilateral upper extremity supported Static Sitting - Level of Assistance: 6: Modified independent (Device/Increase time)  End of Session PT - End of Session Equipment Utilized During Treatment: Gait belt Activity Tolerance: Patient tolerated treatment well Patient left: in bed;with call bell/phone within reach;with bed alarm set;with family/visitor present Nurse Communication: Mobility status   GP     Isiaih Hollenbach 01/06/2012, 9:35 AM  [facility] PT [phone]

## 2012-01-06 NOTE — Progress Notes (Signed)
The patient is in ETOH withdrawal.  EGD/Colonoscopy cannot be performed.  Signing off for now.  Call when the patient is stable.

## 2012-01-07 ENCOUNTER — Encounter (HOSPITAL_COMMUNITY): Payer: Self-pay | Admitting: Gastroenterology

## 2012-01-07 DIAGNOSIS — F10239 Alcohol dependence with withdrawal, unspecified: Secondary | ICD-10-CM

## 2012-01-07 DIAGNOSIS — J449 Chronic obstructive pulmonary disease, unspecified: Secondary | ICD-10-CM

## 2012-01-07 LAB — CBC
MCH: 26.2 pg (ref 26.0–34.0)
MCV: 79.5 fL (ref 78.0–100.0)
Platelets: 272 10*3/uL (ref 150–400)
RDW: 15.8 % — ABNORMAL HIGH (ref 11.5–15.5)

## 2012-01-07 LAB — COMPREHENSIVE METABOLIC PANEL
ALT: 35 U/L (ref 0–53)
AST: 40 U/L — ABNORMAL HIGH (ref 0–37)
Albumin: 3.1 g/dL — ABNORMAL LOW (ref 3.5–5.2)
Calcium: 9.5 mg/dL (ref 8.4–10.5)
Creatinine, Ser: 0.83 mg/dL (ref 0.50–1.35)
Sodium: 141 mEq/L (ref 135–145)
Total Protein: 7.4 g/dL (ref 6.0–8.3)

## 2012-01-07 LAB — PHOSPHORUS: Phosphorus: 3.9 mg/dL (ref 2.3–4.6)

## 2012-01-07 LAB — GLUCOSE, CAPILLARY: Glucose-Capillary: 125 mg/dL — ABNORMAL HIGH (ref 70–99)

## 2012-01-07 LAB — STOOL CULTURE

## 2012-01-07 MED ORDER — POTASSIUM CHLORIDE 10 MEQ/100ML IV SOLN: 10.0000 meq | INTRAVENOUS | Status: DC

## 2012-01-07 MED ORDER — DEXTROSE 50 % IV SOLN
INTRAVENOUS | Status: AC
  Administered 2012-01-07: 25 mL via INTRAVENOUS
  Filled 2012-01-07: qty 50

## 2012-01-07 MED ORDER — LORAZEPAM 2 MG/ML IJ SOLN
2.0000 mg | INTRAMUSCULAR | Status: DC | PRN
  Administered 2012-01-07: 2 mg via INTRAVENOUS
  Filled 2012-01-07: qty 1

## 2012-01-07 MED ORDER — HALOPERIDOL LACTATE 5 MG/ML IJ SOLN
INTRAMUSCULAR | Status: AC
  Filled 2012-01-07: qty 1

## 2012-01-07 MED ORDER — HALOPERIDOL LACTATE 5 MG/ML IJ SOLN
5.0000 mg | INTRAMUSCULAR | Status: DC | PRN
  Filled 2012-01-07: qty 1

## 2012-01-07 MED ORDER — HALOPERIDOL LACTATE 5 MG/ML IJ SOLN: 10.0000 mg | INTRAMUSCULAR | Status: DC | PRN

## 2012-01-07 MED ORDER — SODIUM CHLORIDE 0.9 % IV SOLN
INTRAVENOUS | Status: DC
  Administered 2012-01-07: 10 mL/h via INTRAVENOUS

## 2012-01-07 MED ORDER — POTASSIUM CHLORIDE 10 MEQ/100ML IV SOLN
10.0000 meq | INTRAVENOUS | Status: AC
  Administered 2012-01-07 (×3): 10 meq via INTRAVENOUS
  Filled 2012-01-07 (×3): qty 100

## 2012-01-07 MED ORDER — LEVALBUTEROL HCL 0.63 MG/3ML IN NEBU
0.6300 mg | INHALATION_SOLUTION | Freq: Four times a day (QID) | RESPIRATORY_TRACT | Status: DC
  Administered 2012-01-07 (×2): 0.63 mg via RESPIRATORY_TRACT
  Filled 2012-01-07 (×6): qty 3

## 2012-01-07 MED ORDER — LEVALBUTEROL HCL 0.63 MG/3ML IN NEBU
0.6300 mg | INHALATION_SOLUTION | RESPIRATORY_TRACT | Status: DC | PRN
  Filled 2012-01-07: qty 3

## 2012-01-07 MED ORDER — HALOPERIDOL LACTATE 5 MG/ML IJ SOLN
5.0000 mg | Freq: Once | INTRAMUSCULAR | Status: AC
  Administered 2012-01-07: 5 mg via INTRAVENOUS

## 2012-01-07 NOTE — Progress Notes (Signed)
CBG: 64  Treatment: D50 IV 25 mL  Symptoms: None  Follow-up CBG: Time: CBG Result:  Possible Reasons for Event: Inadequate meal intake  Comments/MD notified:    Kendell Bane

## 2012-01-07 NOTE — Progress Notes (Signed)
Pt got progressively more agitated, received his prn Ativan IV, however without positive result. E-link was notified and order was received for Haldol. Pt. received one dose of Haldol IV 5mg . Pt start having sever tremors, increased in HR 140s and RR 30-40s. Pt responded to his name, pupils are equal and reactive. E-link was notified.

## 2012-01-07 NOTE — Progress Notes (Signed)
[facility] [place] TEAM 1 - Stepdown/ICU TEAM  PCP:  No primary provider on file.  Subjective: 61 yo who was originally admitted after presenting with complaints of multiple falls as well as some nausea and vomiting.  He was discovered to be suffering with a right middle lobe pneumonia during the initial portion of his hospital stay.  He then began to develop hematochezia in the setting of anemia.  The plan was to proceed with EGD and colonoscopy, but the patient developed acute alcohol withdrawal and these procedures were unable to be completed.  Due to his refractory withdrawal he was transferred to the step down unit for close monitoring.  This morning the patient is quite sedated.  He does not appear uncomfortable.  There are multiple family members in the room who stated they feel he is "resting much better today".  Objective:  Intake/Output Summary (Last 24 hours) at 01/07/12 1341 Last data filed at 01/07/12 1226  Gross per 24 hour  Intake    550 ml  Output    400 ml  Net    150 ml   Blood pressure 129/88, pulse 108, temperature 97.2 F (36.2 C), temperature source Axillary, resp. rate 29, height 5\' 9"  (1.753 m), weight 61.6 kg (135 lb 12.9 oz), SpO2 97.00%.  CBG (last 3)   Basename 01/07/12 0819 01/06/12 1446  GLUCAP 125* 96   Physical Exam: General: No acute respiratory distress - the patient is heavily sedated and calm Lungs: Clear to auscultation bilaterally without wheezes or crackles Cardiovascular: Tachycardic but regular without gallop or rub Abdomen: Nontender, nondistended, soft, bowel sounds positive, no rebound, no ascites, no appreciable mass Extremities: No significant cyanosis, clubbing, or edema bilateral lower extremities  Lab Results:  Basename 01/07/12 0558 01/05/12 1108  NA 141 134*  K 3.2* 3.9  CL 102 98  CO2 20 24  GLUCOSE 64* 105*  BUN 3* 5*  CREATININE 0.83 0.79  CALCIUM 9.5 8.8  MG 1.7 --  PHOS 3.9 --    Basename 01/07/12 0558   AST 40*  ALT 35  ALKPHOS 73  BILITOT 0.5  PROT 7.4  ALBUMIN 3.1*    Basename 01/07/12 0558 01/05/12 1730 01/05/12 0345  WBC 4.4 -- --  NEUTROABS -- -- --  HGB 9.1* 10.5* 9.4*  HCT 27.6* 30.9* 28.1*  MCV 79.5 -- --  PLT 272 -- --    Micro Results: Recent Results (from the past 240 hour(s))  MRSA PCR SCREENING     Status: Normal   Collection Time   01/03/12  4:46 PM      Component Value Range Status Comment   MRSA by PCR NEGATIVE  NEGATIVE Final   URINE CULTURE     Status: Normal   Collection Time   01/03/12  6:46 PM      Component Value Range Status Comment   Specimen Description URINE, CLEAN CATCH   Final    Special Requests NONE   Final    Culture  Setup Time 01/04/2012 05:08   Final    Colony Count NO GROWTH   Final    Culture NO GROWTH   Final    Report Status 01/05/2012 FINAL   Final   CLOSTRIDIUM DIFFICILE BY PCR     Status: Normal   Collection Time   01/03/12 11:06 PM      Component Value Range Status Comment   C difficile by pcr NEGATIVE  NEGATIVE Final   STOOL CULTURE     Status:  Normal   Collection Time   01/03/12 11:06 PM      Component Value Range Status Comment   Specimen Description STOOL   Final    Special Requests NONE   Final    Culture     Final    Value: NO SALMONELLA, SHIGELLA, CAMPYLOBACTER, YERSINIA, OR E.COLI 0157:H7 ISOLATED     Note: REDUCED NORMAL FLORA PRESENT   Report Status 01/07/2012 FINAL   Final   CULTURE, EXPECTORATED SPUTUM-ASSESSMENT     Status: Normal   Collection Time   01/04/12  6:17 AM      Component Value Range Status Comment   Specimen Description SPUTUM   Final    Special Requests Normal   Final    Sputum evaluation     Final    Value: MICROSCOPIC FINDINGS SUGGEST THAT THIS SPECIMEN IS NOT REPRESENTATIVE OF LOWER RESPIRATORY SECRETIONS. PLEASE RECOLLECT.     CALLED TO A.PETTIFORD,RN [id number] 01/04/12 BY M.CAMPBELL   Report Status 01/04/2012 FINAL   Final   CULTURE, EXPECTORATED SPUTUM-ASSESSMENT     Status: Normal    Collection Time   01/04/12  4:13 PM      Component Value Range Status Comment   Specimen Description SPUTUM   Final    Special Requests Normal   Final    Sputum evaluation     Final    Value: MICROSCOPIC FINDINGS SUGGEST THAT THIS SPECIMEN IS NOT REPRESENTATIVE OF LOWER RESPIRATORY SECRETIONS. PLEASE RECOLLECT.     CALLED TO KAISER,ASHLEY RN 01/04/12 1709 WOOTEN,K   Report Status 01/04/2012 FINAL   Final   MRSA PCR SCREENING     Status: Normal   Collection Time   01/06/12  5:32 PM      Component Value Range Status Comment   MRSA by PCR NEGATIVE  NEGATIVE Final     Studies/Results: All recent x-ray/radiology reports have been reviewed in detail.   Medications: I have reviewed the patient's complete medication list.  Assessment/Plan:  Etoh abuse with active withdrawal Pt developed active withdrawal 01/06/2012 - transferred to SDU for monitoring - though withdrawal symptoms were difficult to manage initially he is now quite sedate and stable - we will continue our current treatment plan and follow  Aspiration PNA RML Remains on empiric antibiotic therapy - afebrile with normal white blood cell count  dyspahgia On D2 diet s/p SLP eval  hypokalemia Replace and follow  left rib fractures 5 through 7  Cirrhosis of liver due to EtOH Patient has not stopped drinking long enough to allow evaluation of his liver disease - see GI note  HTN Blood pressure well controlled at present time  COPD Well compensated at present  Diarrhea C diff negative   Anemia - heme positive stool - ? hematochezia GI has seen - pt was planned for EGD and colo, but was combative and agitated so this had to be canceled - GI has asked to be called back when the pt is willing/able to proceed  Disposition Remain in step down unit until patient more alert and withdrawal resolved  Lonia Blood, MD [facility] Office  [phone] Pager [phone]  On-Call/Text Page:      [email or web address]       password [facility]

## 2012-01-07 NOTE — Care Management Note (Unsigned)
    Page 1 of 1   01/07/2012     9:28:01 AM   CARE MANAGEMENT NOTE 01/07/2012  Patient:  EMBRY, HUSS   Account Number:  [id number]  Date Initiated:  01/07/2012  Documentation initiated by:  Letha Cape  Subjective/Objective Assessment:   dx asp pna  admit-lives alone.     Action/Plan:   Anticipated DC Date:  01/10/2012   Anticipated DC Plan:  HOME/SELF CARE      DC Planning Services  CM consult      Choice offered to / List presented to:             Status of service:  In process, will continue to follow Medicare Important Message given?   (If response is "NO", the following Medicare IM given date fields will be blank) Date Medicare IM given:   Date Additional Medicare IM given:    Discharge Disposition:    Per UR Regulation:  Reviewed for med. necessity/level of care/duration of stay  If discussed at Long Length of Stay Meetings, dates discussed:    Comments:  01/07/12 9:26 Letha Cape RN, BSN  [phone] patient lives alone, patient having DT's transferred to unit.

## 2012-01-07 NOTE — Plan of Care (Signed)
Problem: Phase II Progression Outcomes Goal: Vital signs remain stable Outcome: Not Progressing Pt continues to remain tachycardic

## 2012-01-07 NOTE — Progress Notes (Signed)
CSW attempted to assess patient and discuss pt current substance abuse. Pt currently sleeping, per chart review and discussion with medical team pt has been very lethargic. CSW will continue to follow to assess when more appropriate.  Catha Gosselin, Theresia Majors  [phone] .01/07/2012 1511pm

## 2012-01-07 NOTE — Progress Notes (Signed)
eLink Physician-Brief Progress Note Patient Name: Peter [name] DOB: 1950-08-07 MRN: [id number]  Date of Service  01/07/2012   HPI/Events of Note  Call from resp therapy requesting change in mode of inhaler as well as reporting tachycardia in the setting of AF  eICU Interventions  Plan: Change to xopenex nebulizer q6 and prn   Intervention Category Intermediate Interventions: Respiratory distress - evaluation and management  Kynnedi Zweig 01/07/2012, 12:58 AM

## 2012-01-07 NOTE — Progress Notes (Signed)
PRN ativan given for tremors and agitation/anxiety.  Pt A&O to self, place & situation.   Sitter at bedside; will continue to monitor closely and update as needed

## 2012-01-07 NOTE — Progress Notes (Signed)
OT Cancellation Note  Treatment cancelled today due to medical issues with patient which prohibited therapy.  Pt lethargic, medicated for ETOH withdrawal.  Unable to participate in therapy.  Will continue to follow.  Evern Bio 01/07/2012, 1:47 [id number]

## 2012-01-08 DIAGNOSIS — S2249XA Multiple fractures of ribs, unspecified side, initial encounter for closed fracture: Secondary | ICD-10-CM

## 2012-01-08 DIAGNOSIS — D649 Anemia, unspecified: Secondary | ICD-10-CM

## 2012-01-08 LAB — BASIC METABOLIC PANEL
BUN: 4 mg/dL — ABNORMAL LOW (ref 6–23)
CO2: 24 mEq/L (ref 19–32)
Chloride: 102 mEq/L (ref 96–112)
Creatinine, Ser: 0.87 mg/dL (ref 0.50–1.35)
Glucose, Bld: 89 mg/dL (ref 70–99)

## 2012-01-08 MED ORDER — POTASSIUM CHLORIDE CRYS ER 20 MEQ PO TBCR
40.0000 meq | EXTENDED_RELEASE_TABLET | Freq: Once | ORAL | Status: AC
  Administered 2012-01-08: 40 meq via ORAL
  Filled 2012-01-08: qty 2

## 2012-01-08 MED ORDER — MAGNESIUM SULFATE 40 MG/ML IJ SOLN
2.0000 g | Freq: Once | INTRAMUSCULAR | Status: AC
  Administered 2012-01-08: 2 g via INTRAVENOUS
  Filled 2012-01-08: qty 50

## 2012-01-08 MED ORDER — AMOXICILLIN-POT CLAVULANATE 875-125 MG PO TABS
1.0000 | ORAL_TABLET | Freq: Two times a day (BID) | ORAL | Status: DC
  Administered 2012-01-08: 1 via ORAL
  Filled 2012-01-08 (×3): qty 1

## 2012-01-08 MED ORDER — BUDESONIDE-FORMOTEROL FUMARATE 80-4.5 MCG/ACT IN AERO
2.0000 | INHALATION_SPRAY | Freq: Two times a day (BID) | RESPIRATORY_TRACT | Status: DC
  Administered 2012-01-08 – 2012-01-09 (×2): 2 via RESPIRATORY_TRACT
  Filled 2012-01-08: qty 6.9

## 2012-01-08 MED ORDER — LEVALBUTEROL HCL 0.63 MG/3ML IN NEBU
0.6300 mg | INHALATION_SOLUTION | Freq: Four times a day (QID) | RESPIRATORY_TRACT | Status: DC | PRN
  Filled 2012-01-08: qty 3

## 2012-01-08 MED ORDER — IPRATROPIUM BROMIDE 0.02 % IN SOLN: 0.5000 mg | Freq: Four times a day (QID) | RESPIRATORY_TRACT | Status: DC | PRN

## 2012-01-08 NOTE — Progress Notes (Signed)
Pt transferred to 5523 from 2900. Report received prior to arrival. Bed alarm placed and pt in camera room. Sitter present until 1900. IV saline locked with s/s of infection or infiltration.  

## 2012-01-08 NOTE — Progress Notes (Signed)
TRANSFERRED TO 5523 BY WHEELCHAIR, STABLE, BELONGINGS WITH PT, REPORT GIVEN TO RN.

## 2012-01-08 NOTE — Progress Notes (Signed)
Physical Therapy Treatment Patient Details Name: Peter [name] MRN: [id number] DOB: 04-11-1951 Today's Date: 01/08/2012 Time: 8413-2440 PT Time Calculation (min): 24 min  PT Assessment / Plan / Recommendation Comments on Treatment Session  Pt reports foot pain in addition to rib pain from recent fall. Denies shoes would help with pain while walking. Cognition remains impaired and will need 24/7 assist on d/c.    Follow Up Recommendations  Home health PT;Supervision/Assistance - 24 hour    Barriers to Discharge        Equipment Recommendations  Rolling walker with 5" wheels;3 in 1 bedside comode;Tub/shower seat    Recommendations for Other Services    Frequency Min 3X/week   Plan Discharge plan remains appropriate;Frequency remains appropriate    Precautions / Restrictions Precautions Precautions: Fall Precaution Comments: ? ETOH withdrawal Restrictions Other Position/Activity Restrictions: rib fxs, watch HR with activity   Pertinent Vitals/Pain HR 120 at rest; up to 139 with standing and stayed 139-141 with ambulation.     Mobility  Bed Mobility Bed Mobility: Not assessed Transfers Transfers: Sit to Stand;Stand to Sit Sit to Stand: 4: Min guard;With upper extremity assist;With armrests;From chair/3-in-1 Stand to Sit: 4: Min guard;With upper extremity assist;With armrests;To chair/3-in-1 Details for Transfer Assistance: vc for safe use of RW/safe hand placement; pt with intial posterior lean upon standing Ambulation/Gait Ambulation/Gait Assistance: 4: Min assist Ambulation Distance (Feet): 200 Feet Assistive device: Rolling walker Ambulation/Gait Assistance Details: cues to keep RW closer to body and assist to direct RW (especially with turns); cues for upright posture Gait Pattern: Step-through pattern;Decreased stride length;Right foot flat;Left foot flat;Trunk flexed (walks on outside of Rt foot due to injury/pain)    Exercises General Exercises - Lower Extremity Hip  Flexion/Marching: AROM;Both;10 reps;Standing Toe Raises: AROM;5 reps;Standing Heel Raises: AROM;10 reps;Standing   PT Diagnosis:    PT Problem List:   PT Treatment Interventions:     PT Goals Acute Rehab PT Goals Pt will go Sit to Stand: with modified independence;with upper extremity assist PT Goal: Sit to Stand - Progress: Progressing toward goal Pt will go Stand to Sit: with modified independence;with upper extremity assist PT Goal: Stand to Sit - Progress: Progressing toward goal Pt will Ambulate: 51 - 150 feet;with modified independence;with rolling walker PT Goal: Ambulate - Progress: Progressing toward goal  Visit Information  Last PT Received On: 01/08/12 Assistance Needed: +1    Subjective Data  Subjective: I can't afford to fal again Patient Stated Goal: To get back home.   Cognition  Overall Cognitive Status: Impaired Area of Impairment: Other (comment) (slow processing; orientation) Arousal/Alertness: Awake/alert Behavior During Session: WFL for tasks performed    Balance  Balance Balance Assessed: Yes Static Standing Balance Static Standing - Balance Support: Left upper extremity supported Static Standing - Level of Assistance: 4: Min assist Dynamic Standing Balance Dynamic Standing - Balance Support: Bilateral upper extremity supported Dynamic Standing - Level of Assistance: 4: Min assist  End of Session PT - End of Session Equipment Utilized During Treatment: Gait belt Activity Tolerance: Patient tolerated treatment well;Other (comment) (HR stays 139-141 with standing and gait) Patient left: in chair;with call bell/phone within reach;with nursing in room;with family/visitor present Nurse Communication: Mobility status   GP     Perfecto Purdy 01/08/2012, 12:19 PM Pager [phone]

## 2012-01-08 NOTE — Progress Notes (Addendum)
[facility] [place] TEAM 1 - Stepdown/ICU TEAM  PCP:  No primary provider on file.  Subjective: 61 yo who was originally admitted after presenting with complaints of multiple falls as well as some nausea and vomiting.  He was discovered to be suffering with a right middle lobe pneumonia during the initial portion of his hospital stay.  He then began to develop hematochezia in the setting of anemia.  The plan was to proceed with EGD and colonoscopy, but the patient developed acute alcohol withdrawal and these procedures were unable to be completed.  Due to his refractory withdrawal he was transferred to the step down unit for close monitoring.   Alert. Mom and cousin at bedside. He has no complaints. Asking why he always "Keeps a cold" ie..has had a congested cough for 2 yrs. Explained what COPD is and why he needs to stop smoking. He is agreeable to continuing the nicotine patches as an outpt.   Objective:  Intake/Output Summary (Last 24 hours) at 01/08/12 1636 Last data filed at 01/08/12 1600  Gross per 24 hour  Intake 1288.5 ml  Output    401 ml  Net  887.5 ml   Blood pressure 93/60, pulse 116, temperature 98.5 F (36.9 C), temperature source Oral, resp. rate 25, height 5\' 9"  (1.753 m), weight 61.6 kg (135 lb 12.9 oz), SpO2 100.00%.  CBG (last 3)   Basename 01/07/12 0819 01/06/12 1446  GLUCAP 125* 96   Physical Exam: General: No acute respiratory distress - awake alert and oriented Lungs: Clear to auscultation bilaterally without wheezes or crackles- has ronchi Cardiovascular: Tachycardic but regular without gallop or rub Abdomen: Nontender, nondistended, soft, bowel sounds positive, no rebound, no ascites, no appreciable mass Extremities: No significant cyanosis, clubbing, or edema bilateral lower extremities  Lab Results:  Basename 01/08/12 0600 01/07/12 0558  NA 139 141  K 3.1* 3.2*  CL 102 102  CO2 24 20  GLUCOSE 89 64*  BUN 4* 3*  CREATININE 0.87 0.83    CALCIUM 9.6 9.5  MG 1.7 1.7  PHOS 4.3 3.9    Basename 01/07/12 0558  AST 40*  ALT 35  ALKPHOS 73  BILITOT 0.5  PROT 7.4  ALBUMIN 3.1*    Basename 01/07/12 0558 01/05/12 1730  WBC 4.4 --  NEUTROABS -- --  HGB 9.1* 10.5*  HCT 27.6* 30.9*  MCV 79.5 --  PLT 272 --    Micro Results: reviewed  Studies/Results: All recent x-ray/radiology reports have been reviewed in detail.   Medications: I have reviewed the patient's complete medication list.  Assessment/Plan:  Etoh abuse with active withdrawal Pt developed active withdrawal 01/06/2012 - transferred to SDU for monitoring - though withdrawal symptoms were difficult to manage initially he is now out of ETOH withdrawal. Last dose of Haldol at 2AM on 7/3 and last dose of Ativan 1:52 AM on 7/3.   Aspiration PNA RML Remains on empiric antibiotic therapy - afebrile with normal white blood cell count Started on 6/29. Will switch to Augmentin today.   COPD Start Symbicort. Will order PFTs and will need Nicotine patches on d/c.   dyspahgia On D2 diet s/p SLP eval  hypokalemia Replace and follow- give mag today  left rib fractures 5 through 7  Cirrhosis of liver due to EtOH Patient has not stopped drinking long enough to allow evaluation of his liver disease - see GI note  HTN Blood pressure well controlled at present time  Diarrhea C diff negative   Anemia -  heme positive stool - ? hematochezia GI has seen - pt was planned for EGD and colo, but was combative and agitated so this had to be canceled - I have left a message with Dr Marina Goodell today that he is now stable for these procedures and agreeable to them as long as he can be sedated. He has asked me to make him NPO after midnight.   Iron deficiency Start IV Iron after a test dose and give at least 2-3 doses IV prior to d/c.  Disposition Can transfer to floor- HHPT and 24 hr care once he gets home- his cousin will be staying with him.   Calvert Cantor,  MD [phone]

## 2012-01-08 NOTE — Progress Notes (Signed)
Pt did not have any urine output last night; pt asked to void numerous times, but pt continued to state that he did not have to urinate; bladder scan done, at most 250cc was found; pt then drank 300cc of water; passed on to day nurse to monitor;

## 2012-01-09 ENCOUNTER — Inpatient Hospital Stay (HOSPITAL_COMMUNITY): Payer: Medicaid Other

## 2012-01-09 ENCOUNTER — Encounter (HOSPITAL_COMMUNITY): Payer: Self-pay | Admitting: *Deleted

## 2012-01-09 ENCOUNTER — Encounter (HOSPITAL_COMMUNITY)
Admission: EM | Disposition: A | Discharge status: Home Health | Payer: Self-pay | Source: Home / Self Care | Attending: Internal Medicine

## 2012-01-09 HISTORY — PX: ESOPHAGOGASTRODUODENOSCOPY: SHX5428

## 2012-01-09 SURGERY — EGD (ESOPHAGOGASTRODUODENOSCOPY)
Anesthesia: Moderate Sedation

## 2012-01-09 MED ORDER — NICOTINE 7 MG/24HR TD PT24: 1.0000 | MEDICATED_PATCH | TRANSDERMAL | Status: DC

## 2012-01-09 MED ORDER — DIPHENHYDRAMINE HCL 50 MG/ML IJ SOLN
INTRAMUSCULAR | Status: AC
  Filled 2012-01-09: qty 1

## 2012-01-09 MED ORDER — SODIUM CHLORIDE 0.9 % IV SOLN
125.0000 mg | Freq: Every day | INTRAVENOUS | Status: DC
  Administered 2012-01-09: 125 mg via INTRAVENOUS
  Filled 2012-01-09 (×2): qty 10

## 2012-01-09 MED ORDER — FENTANYL CITRATE 0.05 MG/ML IJ SOLN
INTRAMUSCULAR | Status: AC
  Filled 2012-01-09: qty 2

## 2012-01-09 MED ORDER — ALBUTEROL SULFATE (5 MG/ML) 0.5% IN NEBU
2.5000 mg | INHALATION_SOLUTION | Freq: Once | RESPIRATORY_TRACT | Status: AC
  Administered 2012-01-09: 2.5 mg via RESPIRATORY_TRACT

## 2012-01-09 MED ORDER — NICOTINE 21 MG/24HR TD PT24: 1.0000 | MEDICATED_PATCH | Freq: Every day | TRANSDERMAL | Status: DC

## 2012-01-09 MED ORDER — SODIUM CHLORIDE 0.9 % IV SOLN
Freq: Once | INTRAVENOUS | Status: AC
  Administered 2012-01-09: 500 mL via INTRAVENOUS

## 2012-01-09 MED ORDER — NICOTINE 14 MG/24HR TD PT24: 1.0000 | MEDICATED_PATCH | TRANSDERMAL | Status: DC

## 2012-01-09 MED ORDER — BUDESONIDE-FORMOTEROL FUMARATE 80-4.5 MCG/ACT IN AERO: 2.0000 | INHALATION_SPRAY | Freq: Two times a day (BID) | RESPIRATORY_TRACT | Status: DC

## 2012-01-09 MED ORDER — MIDAZOLAM HCL 10 MG/2ML IJ SOLN
INTRAMUSCULAR | Status: DC | PRN
  Administered 2012-01-09 (×2): 2 mg via INTRAVENOUS

## 2012-01-09 MED ORDER — AMOXICILLIN-POT CLAVULANATE 875-125 MG PO TABS: 1.0000 | ORAL_TABLET | Freq: Two times a day (BID) | ORAL | Status: AC

## 2012-01-09 MED ORDER — GUAIFENESIN-DM 100-10 MG/5ML PO SYRP: 5.0000 mL | ORAL_SOLUTION | ORAL | Status: AC | PRN

## 2012-01-09 MED ORDER — FENTANYL CITRATE 0.05 MG/ML IJ SOLN
INTRAMUSCULAR | Status: DC | PRN
  Administered 2012-01-09 (×2): 25 ug via INTRAVENOUS

## 2012-01-09 MED ORDER — BUTAMBEN-TETRACAINE-BENZOCAINE 2-2-14 % EX AERO
INHALATION_SPRAY | CUTANEOUS | Status: DC | PRN
  Administered 2012-01-09: 2 via TOPICAL

## 2012-01-09 MED ORDER — MIDAZOLAM HCL 10 MG/2ML IJ SOLN
INTRAMUSCULAR | Status: AC
  Filled 2012-01-09: qty 2

## 2012-01-09 MED ORDER — SODIUM CHLORIDE 0.9 % IV SOLN
25.0000 mg | Freq: Once | INTRAVENOUS | Status: AC
  Administered 2012-01-09: 25 mg via INTRAVENOUS
  Filled 2012-01-09: qty 2

## 2012-01-09 NOTE — Progress Notes (Signed)
Clinical Social Work Department BRIEF PSYCHOSOCIAL ASSESSMENT 01/09/2012  Patient:  Peter [name], Peter [name]     Account Number:  [id number]     Admit date:  01/03/2012  Clinical Social Worker:  Dennison Bulla  Date/Time:  01/09/2012 04:00 PM  Referred by:  Physician  Date Referred:  01/09/2012 Referred for  Substance Abuse  SNF Placement   Other Referral:   Interview type:  Patient Other interview type:    PSYCHOSOCIAL DATA Living Status:  FAMILY Admitted from facility:   Level of care:   Primary support name:  Dollie Primary support relationship to patient:  PARENT Degree of support available:   Strong    CURRENT CONCERNS Current Concerns  Post-Acute Placement  Substance Abuse   Other Concerns:    SOCIAL WORK ASSESSMENT / PLAN CSW received referral due to patient abusing alcohol and needing 24 hour care at dc. CSW reviewed chart and met with patient at bedside. No visitors were present.    CSW introduced myself and explained role. CSW discussed PT recommendations with patient. Patient reported that he had already made arrangements for brother to stay with him 24 hours a day in order to assist him. Patient reports he has had HH in the past and feels this could be beneficial again.    CSW and patient spoke about patient's alcohol use. Patient reports that he is currently drinking every day. Patient was unable to describe the amount he drinks daily and reports that it "depends each day." CSW and patient discussed triggers to drinking and coping skills. Patient was able to report that when he is feeling upset or stressed he tends to drink more often. Patient reports that this time he does not feel his drinking is a problem and does not wish to seek treatment. Patient reports that he has gone to AA in the past but does not feel it was helpful. CSW explained other resources available but patient politely declined at this time.    CSW is signing off but available if needed.    Assessment/plan status:  No Further Intervention Required Other assessment/ plan:   SBIRT   Information/referral to community resources:   Patient declined community resources for SA treatment    PATIENT'S/FAMILY'S RESPONSE TO PLAN OF CARE: Patient was alert and oriented. Patient reports that he has already made a plan for when he is dc from the hospital. Patient appeared to minimize his alcohol use and withdrew from the conversation when discussing treatment options. Patient declined all resources at this time.        Coverage for [employer]

## 2012-01-09 NOTE — Progress Notes (Signed)
Barton Fanny to be D/C'd Home per MD order.  Discussed with the patient the After Visit Summary and all questions fully answered. All IV's discontinued with no bleeding noted. All belongings returned to patient for patient to take home.   Susann Givens, RN, [facility] 01/09/2012 7:50 PM

## 2012-01-09 NOTE — Interval H&P Note (Signed)
History and Physical Interval Note:  01/09/2012 12:14 PM  Peter [name]  has presented today for surgery, with the diagnosis of Anemia and Heme positive stool  The various methods of treatment have been discussed with the patient and family. After consideration of risks, benefits and other options for treatment, the patient has consented to  Procedure(s) (LRB): ESOPHAGOGASTRODUODENOSCOPY (EGD) (N/A) as a surgical intervention .  The patient's history has been reviewed, patient examined, no change in status, stable for surgery.  I have reviewed the patients' chart and labs.  Questions were answered to the patient's satisfaction.     Tashiya Souders D

## 2012-01-09 NOTE — Progress Notes (Signed)
Occupational Therapy Treatment Patient Details Name: Peter [name] MRN: [id number] DOB: 1951-01-27 Today's Date: 01/09/2012 Time: 4098-1191 OT Time Calculation (min): 12 min  OT Assessment / Plan / Recommendation Comments on Treatment Session Pt. motivated for OOB activity today and with improvement in functional status with ADLs    Follow Up Recommendations  Home health OT;Supervision/Assistance - 24 hour       Equipment Recommendations  Rolling walker with 5" wheels;3 in 1 bedside comode;Tub/shower seat       Frequency Min 2X/week   Plan Discharge plan remains appropriate    Precautions / Restrictions Precautions Precautions: Fall Precaution Comments: ? ETOH withdrawal Restrictions Weight Bearing Restrictions: No Other Position/Activity Restrictions: rib fxs, watch HR with activity       ADL  Grooming: Performed;Wash/dry hands;Wash/dry face;Set up Where Assessed - Grooming: Unsupported sitting Lower Body Dressing: Performed;Set up Where Assessed - Lower Body Dressing: Unsupported sit to stand Toilet Transfer: Simulated;Min guard Toilet Transfer Method: Stand pivot Acupuncturist: Other (comment) (recliner) Transfers/Ambulation Related to ADLs: Min verbal cues for hand placement and technique ADL Comments: Pt. with improved activity tolerance and motivated for OOB activity today.      OT Goals Acute Rehab OT Goals OT Goal Formulation: With patient Time For Goal Achievement: 01/12/12 Potential to Achieve Goals: Fair ADL Goals Pt Will Perform Grooming: with supervision;Unsupported;Standing at sink ADL Goal: Grooming - Progress: Progressing toward goals Pt Will Perform Lower Body Dressing: with supervision;Sit to stand from bed ADL Goal: Lower Body Dressing - Progress: Met Pt Will Transfer to Toilet: with supervision;with DME;Ambulation;Regular height toilet ADL Goal: Toilet Transfer - Progress: Progressing toward goals  Visit Information  Last OT  Received On: 01/09/12 Assistance Needed: +1          Cognition  Overall Cognitive Status: Impaired Area of Impairment:  (slow to process ) Arousal/Alertness: Awake/alert Orientation Level: Appears intact for tasks assessed Behavior During Session: [facility] for tasks performed Safety/Judgement: Decreased safety judgement for tasks assessed;Decreased awareness of safety precautions;Decreased awareness of need for assistance    Mobility Bed Mobility Bed Mobility: Supine to Sit Supine to Sit: 5: Supervision;HOB elevated;With rails Sitting - Scoot to Edge of Bed: 5: Supervision Transfers Transfers: Sit to Stand;Stand to Sit Sit to Stand: 4: Min guard;With upper extremity assist;With armrests;From chair/3-in-1 Stand to Sit: 4: Min guard;With upper extremity assist;With armrests;To chair/3-in-1 Details for Transfer Assistance: vc for safe use of RW/safe hand placement; pt with intial posterior lean upon standing         End of Session OT - End of Session Equipment Utilized During Treatment: Gait belt Activity Tolerance: Patient tolerated treatment well Patient left: in chair;with call bell/phone within reach;with family/visitor present Nurse Communication: Mobility status       Zeppelin Commisso, OTR/L Pager [phone] 01/09/2012, 10:21 AM

## 2012-01-09 NOTE — Op Note (Signed)
Eligha Bridegroom [facility] [address] [place], [place]  [address]  OPERATIVE PROCEDURE REPORT  PATIENT:  Peter [name], Peter [name]  MR#:  [id number] BIRTHDATE:  1950-12-07  GENDER:  male ENDOSCOPIST:  Jeani Hawking, MD ASSISTANT:  Julieta Gutting and Judithann Sauger, RN PROCEDURE DATE:  01/09/2012 PROCEDURE:  EGD with biopsy, 19147 ASA CLASS:  Class III INDICATIONS:  Heme positive stool and anemia MEDICATIONS:  Fentanyl 0 mcg IV, Versed 4 mg IV  DESCRIPTION OF PROCEDURE:   After the risks benefits and alternatives of the procedure were thoroughly explained, informed consent was obtained.  The Pentax Gastroscope [id number] endoscope was introduced through the mouth and advanced to the second portion of the duodenum, without limitations.  The instrument was slowly withdrawn as the mucosa was fully examined. <<PROCEDUREIMAGES>>  FINDINGS:  The patient had difficulty tolerating the procedure. "You about killed me." The esophagus was negative for any evidence of varices. No fundic varices in the stomach, but there was the suspicion of portal HTN gastropathy. A small nonbleeding distal body AVM was noted. Some gastritis was identified in the antrum and cold biopsies were obtained. The duodenum was normal. Retroflexed views revealed no abnormalities.    The scope was then withdrawn from the patient and the procedure terminated. COMPLICATIONS:  None  IMPRESSION:  1) Mild gastritis 2) ? Portal hypertensive gastropathy. 3) Nonbleeding distal body AVM.  RECOMMENDATIONS:  1) Colonoscopy will be performed as an outpatient with Propofol in the next 1-2 weeks.  I will make the arrangements.  His HGB is stable at this time and the main concern for esophageal varices and fundic varices have been ruled out. 2) Continue with supportive care. 3) Signing off.  ______________________________ Jeani Hawking, MD  n. Rosalie DoctorJeani Hawking at 01/09/2012 12:34 PM  Barton Fanny, [phone]

## 2012-01-09 NOTE — Progress Notes (Signed)
PT cancellation  Noted pt off the floor for procedure. Will f/u later as time allows.   Ivonne Andrew PT, DPT [phone]

## 2012-01-09 NOTE — Discharge Summary (Addendum)
DISCHARGE SUMMARY  Peter [name]  MR#: [id number]  DOB:06-17-1951  Date of Admission: 01/03/2012 Date of Discharge: 01/09/2012  Attending Physician:Zaydn Gutridge  Patient's GNF:AOZHYQ, [name], FNP  Consults:  GI- Jeani Hawking MD Trauma Surgery- Gabrielle Dare. Thompson  Discharge Diagnoses:  *Aspiration pneumonia  Alcohol withdrawal  COPD (chronic obstructive pulmonary disease) Hematochezia   Anemia  Hypokalemia  Syncope  Multiple rib fractures  Hyperlipidemia    Discharge Medications: Medication List  As of 01/09/2012  6:26 PM   STOP taking these medications         aspirin EC 81 MG tablet      clindamycin 150 MG capsule         TAKE these medications         albuterol 108 (90 BASE) MCG/ACT inhaler   Commonly known as: PROVENTIL HFA;VENTOLIN HFA   Inhale 2 puffs into the lungs every 6 (six) hours as needed. Shortness of breath      amoxicillin-clavulanate 875-125 MG per tablet   Commonly known as: AUGMENTIN   Take 1 tablet by mouth every 12 (twelve) hours.      budesonide-formoterol 80-4.5 MCG/ACT inhaler   Commonly known as: SYMBICORT   Inhale 2 puffs into the lungs 2 (two) times daily.      ferrous sulfate 325 (65 FE) MG tablet   Take 325 mg by mouth daily with breakfast.      folic acid 1 MG tablet   Commonly known as: FOLVITE   Take 1 mg by mouth daily.      guaiFENesin-dextromethorphan 100-10 MG/5ML syrup   Commonly known as: ROBITUSSIN DM   Take 5 mLs by mouth every 4 (four) hours as needed for cough.      HYDROcodone-acetaminophen 7.5-500 MG per tablet   Commonly known as: LORTAB   Take 1 tablet by mouth every 6 (six) hours as needed. For pain      nicotine 21 mg/24hr patch   Commonly known as: NICODERM CQ - dosed in mg/24 hours   Place 1 patch onto the skin daily.      nicotine 14 mg/24hr patch   Commonly known as: NICODERM CQ - dosed in mg/24 hours   Place 1 patch onto the skin daily.      nicotine 7 mg/24hr patch   Commonly known as:  NICODERM CQ - dosed in mg/24 hr   Place 1 patch onto the skin daily.      pravastatin 40 MG tablet   Commonly known as: PRAVACHOL   Take 40 mg by mouth daily.      thiamine 100 MG tablet   Commonly known as: VITAMIN B-1   Take 100 mg by mouth daily.      traMADol 50 MG tablet   Commonly known as: ULTRAM   Take 50 mg by mouth 2 (two) times daily as needed. For pain      vitamin B-12 100 MCG tablet   Commonly known as: CYANOCOBALAMIN   Take 100 mcg by mouth daily.      vitamin E 200 UNIT capsule   Generic drug: vitamin E   Take 200 Units by mouth daily.             Procedures: Dg Chest 2 View  01/06/2012  *RADIOLOGY REPORT*  Clinical Data: Cough, shortness of breath.  Evaluate pneumonia.  CHEST - 2 VIEW  Comparison: 01/04/2012  Findings: Improving right middle lobe infiltrate/pneumonia.  Mild opacity persist.  Minimal left base atelectasis.  Heart is normal size.  Small right effusion.  IMPRESSION: Improving right middle lobe pneumonia.  Small right effusion.  Original Report Authenticated By: Cyndie Chime, M.D.   Dg Ribs Unilateral Left  01/03/2012  *RADIOLOGY REPORT*  Clinical Data: Fall, left chest pain  LEFT RIBS - 2 VIEW  Comparison: None.  Findings: Right lower lobe opacity, incomplete visualized, as described on prior chest radiographs.  Minimally displaced left lateral 5th, 6th, and possibly 7th rib fractures.  IMPRESSION: Minimally displaced left lateral 5th, 6th, and possibly 7th rib fractures.  Original Report Authenticated By: Charline Bills, M.D.   Ct Head Wo Contrast  01/03/2012  *RADIOLOGY REPORT*  Clinical Data: Multiple falls.  CT HEAD WITHOUT CONTRAST  Technique:  Contiguous axial images were obtained from the base of the skull through the vertex without contrast.  Comparison: None.  Findings: There is diffuse patchy low density throughout the subcortical and periventricular white matter consistent with chronic small vessel ischemic change.  There is prominence  of the sulci and ventricles consistent with brain atrophy .  There is no evidence for acute brain infarct, hemorrhage or mass.  There is no evidence for acute brain infarct, hemorrhage or mass.  There is mild mucosal thickening involving the ethmoid air cells and maxillary sinuses.  The mastoid air cells are clear.  The skull is intact.  IMPRESSION:  1.  No acute intracranial abnormalities.  Original Report Authenticated By: Rosealee Albee, M.D.   Ct Abdomen Pelvis W Contrast  01/03/2012  *RADIOLOGY REPORT*  Clinical Data: Dizziness.  History of severe repeated alcohol intoxication.  Multiple falls and syncopal events in recent days. History of trauma from a fall off of a porch.  CT ABDOMEN AND PELVIS WITH CONTRAST  Technique:  Multidetector CT imaging of the abdomen and pelvis was performed following the standard protocol during bolus administration of intravenous contrast.  Contrast: OMNIPAQUE IOHEXOL 300 MG/ML  SOLN  Comparison: No priors.  Findings:  Lung Bases: Extensive air space consolidation in the right middle lobe with multiple air bronchograms.  Patchy ground-glass attenuation and air space consolidation is present to a much lesser extent within the right lower lobe as well.  Abdomen/Pelvis:  Multiple low attenuation lesions scattered throughout the hepatic parenchyma, measuring up to 9 mm in diameter, all of which are too small to definitively characterize. There is some high attenuation material layering dependently in the lumen of the gallbladder, which likely represents biliary sludge. No gallstones or findings to suggest acute cholecystitis at this time.  The enhanced appearance of the pancreas, spleen, bilateral adrenal glands and bilateral kidneys is unremarkable.  Normal appendix.  Atherosclerosis in the abdominal and pelvic vasculature, without evidence of aneurysm or dissection.  There is no ascites or pneumoperitoneum and no pathologic distension of bowel.  No definite pathologic  lymphadenopathy identified within the abdomen or pelvis.  The colon is nearly completely decompressed which results in apparent diffuse colonic wall thickening.  This is favored to be secondary to under distension, rather than an underlying pathologic state.  Prostate and urinary bladder are unremarkable in appearance.  Musculoskeletal: There are no aggressive appearing lytic or blastic lesions noted in the visualized portions of the skeleton.  IMPRESSION: 1.  Extensive airspace consolidation in the right middle lobe, and to a lesser extent in the right lower lobe, concerning for pneumonia.  Given the patient's history, this may represent sequelae of aspiration. There is also a small right-sided pleural effusion layering dependently. 2. Biliary sludge within the gallbladder, without evidence of  acute cholecystitis. 3.  Numerous low attenuation lesions scattered throughout the hepatic parenchyma are too small to definitively characterize. These are statistically favored to represent small cysts or biliary hamartomas.  This would require MRI for further characterization if clinically indicated. 4.  Normal appendix. 5.  Atherosclerosis.  Original Report Authenticated By: Florencia Reasons, M.D.   Dg Chest Port 1 View  01/04/2012  *RADIOLOGY REPORT*  Clinical Data: Chest pain  PORTABLE CHEST - 1 VIEW  Comparison:   the previous day's study  Findings: There is more dense airspace consolidation in the right middle lobe.  Left lung remains clear.  Heart size normal.  No definite effusion.  Old left clavicle fracture.  IMPRESSION:  1.  Progressive dense airspace consolidation in the right middle lobe.  Original Report Authenticated By: Osa Craver, M.D.   Dg Chest Portable 1 View  01/03/2012  *RADIOLOGY REPORT*  Clinical Data: Dizziness  PORTABLE CHEST - 1 VIEW  Comparison: 02/12/2011  Findings: The heart size is normal.  No pleural effusion or edema. Dense airspace consolidation within the right lower lobe  is identified, new from previous exam.  There are no displaced rib fractures identified.  IMPRESSION:  1.  Dense airspace consolidation in the right base.  In the acute setting this is most likely due to pneumonia. If there is a history of trauma and no symptoms of pneumonia then this could represent an area of pulmonary contusion.  If the patient is asymptomatic then these findings would be concerning for a pulmonary mass.  Careful clinical correlation and close interval radiographic followup is advised.  Original Report Authenticated By: Rosealee Albee, M.D.   Dg Foot Complete Right  01/03/2012  *RADIOLOGY REPORT*  Clinical Data: Fall, foot pain  RIGHT FOOT COMPLETE - 3+ VIEW  Comparison: None.  Findings: No fracture or dislocation is seen.  The joint spaces are preserved.  The visualized soft tissues are unremarkable.  IMPRESSION: No fracture or dislocation is seen.  Original Report Authenticated By: Charline Bills, M.D.   Dg Swallowing Func-no Report  01/05/2012  CLINICAL DATA: objectively assess swallow function and safety   FLUOROSCOPY FOR SWALLOWING FUNCTION STUDY:  Fluoroscopy was provided for swallowing function study, which was  administered by a speech pathologist.  Final results and recommendations  from this study are contained within the speech pathology report.      Echocardiogram 01/04/12 Study Conclusions  - Procedure narrative: Transthoracic echocardiography. Image quality was suboptimal. The study was technically difficult, as a result of poor acoustic windows. - Left ventricle: The cavity size was normal. Wall thickness was normal. Systolic function was normal. The estimated ejection fraction was in the range of 55% to 60%. Doppler parameters are consistent with abnormal left ventricular relaxation (grade 1 diastolic dysfunction). The E/e' ratio is <10, suggesting normal LV filling pressure. - Left atrium: The atrium was at the upper limits of normal in size. - Right  atrium: The atrium was at the upper limits of normal in size. - Inferior vena cava: The vessel was normal in size; the respirophasic diameter changes were in the normal range (= 50%); findings are consistent with normal central venous pressure.   EGD 01/09/12- Jeani Hawking MD  IMPRESSION: 1) Mild gastritis  2) ? Portal hypertensive gastropathy.  3) Nonbleeding distal body AVM.   RECOMMENDATIONS: 1)  Colonoscopy will be performed as an outpatient with Propofol in  the next 1-2 weeks. I will make the arrangements. His HGB is  stable at  this time and the main concern for esophageal varices  and fundic varices have been ruled out.      Hospital Course: 61 yo who was originally admitted after presenting with complaints of multiple falls as well as some nausea and vomiting. He was discovered to be suffering with a right middle lobe pneumonia during the initial portion of his hospital stay. He then began to develop hematochezia in the setting of anemia. The plan was to proceed with EGD and colonoscopy, but the patient developed acute alcohol withdrawal and these procedures were unable to be completed. Due to his refractory withdrawal he was transferred to the step down unit for close monitoring.  Etoh abuse with active withdrawal  Pt developed severe withdrawal symptoms by 01/06/2012 - transferred to SDU for monitoring - was alert and oriented and out of withdrawal by 01/08/12  Last dose of Haldol at 2AM on 7/3 and last dose of Ativan 1:52 AM on 7/3.   Aspiration PNA RML  Will complete 7 days of antibiotics  COPD  stable Started Symbicort. Cont outpt Albuterol- can go home with inhaler he was using here  PFTs completed- official result pending.  Has seen [facility] for this in the past.  Advised strongly to stop smoking. Nicoderm patches prescribed.   left rib fractures 5 through 7  Patient has been having multiple falls over the past month. - no significant pain from these on  discharge  Cirrhosis of liver due to EtOH  Advised to stop drinking.   HTN  Blood pressure well controlled at present time   Diarrhea  with h/o recent clindamycin use C diff negative  resolved  Anemia - heme positive stool - ? hematochezia  EGD completed today - see result under procedures- Colonoscopy to be done as outpt.   Iron deficiency  Given a dose of  IV Iron after a test dose - cont ferrous sulfate as oupt.    Day of Discharge Physical Exam: BP 130/73  Pulse 86  Temp 98.5 F (36.9 C) (Oral)  Resp 19  Ht 5\' 9"  (1.753 m)  Wt 61.6 kg (135 lb 12.9 oz)  BMI 20.05 kg/m2  SpO2 98% Physical Exam:  General: No acute respiratory distress - awake alert and oriented  Lungs: Clear to auscultation bilaterally without wheezes or crackles- has ronchi  Cardiovascular: Tachycardic but regular without gallop or rub  Abdomen: Nontender, nondistended, soft, bowel sounds positive, no rebound, no ascites, no appreciable mass  Extremities: No significant cyanosis, clubbing, or edema bilateral lower extremities   Iron/TIBC/Ferritin    Component Value Date/Time   IRON <10* 01/03/2012 1529   TIBC Not calculated due to Iron <10. 01/03/2012 1529   FERRITIN 645* 01/03/2012 1529   CBC    Component Value Date/Time   WBC 4.4 01/07/2012 0558   RBC 3.47* 01/07/2012 0558   HGB 9.1* 01/07/2012 0558   HCT 27.6* 01/07/2012 0558   PLT 272 01/07/2012 0558   MCV 79.5 01/07/2012 0558   MCH 26.2 01/07/2012 0558   MCHC 33.0 01/07/2012 0558   RDW 15.8* 01/07/2012 0558   LYMPHSABS 0.3* 01/03/2012 0905   MONOABS 1.0 01/03/2012 0905   EOSABS 0.0 01/03/2012 0905   BASOSABS 0.0 01/03/2012 0905   .   Disposition: stable for d/c   Follow-up Appts: Discharge Orders    Future Orders Please Complete By Expires   Home Health      Questions: Responses:   To provide the following care/treatments PT    OT  Face-to-face encounter      Comments:   I Calvert Cantor certify that this patient is under my care and that I,  or a nurse practitioner or physician's assistant working with me, had a face-to-face encounter that meets the physician face-to-face encounter requirements with this patient on 01/09/2012.   Questions: Responses:   The encounter with the patient was in whole, or in part, for the following medical condition, which is the primary reason for home health care pneumonia, gi bleed, etoh withdrawl   I certify that, based on my findings, the following services are medically necessary home health services Physical therapy   My clinical findings support the need for the above services Complex treatment plan/patient with lack knowledge disease process and treatment   Further, I certify that my clinical findings support that this patient is homebound (i.e. absences from home require considerable and taxing effort and are for medical reasons or religious services or infrequently or of short duration when for other reasons) Unable to leave home safely without assistance   To provide the following care/treatments PT    OT   Increase activity slowly      Discharge instructions      Comments:   Stop drinking alcohol and smoking.      Follow-up with Dr.Hung and your PCP Dr Cameron Sprang in 1-2 wks.    Tests Needing Follow-up: PFTs  Time on Discharge: >45 min  Signed: Gustavia Carie 01/09/2012, 6:26 PM

## 2012-01-09 NOTE — Progress Notes (Signed)
Nutrition Follow-up  Intervention:   1. Add comments to health touch for no hard or touch foods.  2. RD will continue to follow  Assessment:   Pt was planned for EGD earlier this week but began to have ETOH withdrawals and it was postponed to today.  Per flow-sheet intake has been 100% of meals. Pt states his appetite has been good. Does have some problems chewing hard or tough foods r/t dentition, RD will put a note in Health Touch for no hard or tough foods.    Diet Order:  Regular Supplements: Resource Breeze TID Meds: Scheduled Meds:   . sodium chloride   Intravenous Once  . albuterol  2.5 mg Nebulization Once  . amoxicillin-clavulanate  1 tablet Oral Q12H  . aspirin EC  81 mg Oral Daily  . budesonide-formoterol  2 puff Inhalation BID  . feeding supplement  1 Container Oral TID BM  . ferric gluconate (FERRLECIT/NULECIT) IV  125 mg Intravenous Daily  . ferric gluconate (FERRLECIT/NULECIT) Test Dose  25 mg Intravenous Once  . folic acid  1 mg Oral Daily  . magnesium sulfate 1 - 4 g bolus IVPB  2 g Intravenous Once  . multivitamin with minerals  1 tablet Oral Daily  . nicotine  21 mg Transdermal Daily  . pantoprazole (PROTONIX) IV  40 mg Intravenous Q12H  . simvastatin  20 mg Oral q1800  . sodium chloride  3 mL Intravenous Q12H  . thiamine  100 mg Oral Daily   Or  . thiamine  100 mg Intravenous Daily  . DISCONTD: piperacillin-tazobactam (ZOSYN)  IV  3.375 g Intravenous Q8H   Continuous Infusions:   . DISCONTD: sodium chloride 10 mL/hr (01/07/12 2251)   PRN Meds:.acetaminophen, acetaminophen, guaiFENesin-dextromethorphan, haloperidol lactate, ipratropium, levalbuterol, LORazepam, morphine injection, ondansetron (ZOFRAN) IV, ondansetron, DISCONTD: butamben-tetracaine-benzocaine, DISCONTD: fentaNYL, DISCONTD: midazolam  Labs:  CMP     Component Value Date/Time   NA 139 01/08/2012 0600   K 3.1* 01/08/2012 0600   CL 102 01/08/2012 0600   CO2 24 01/08/2012 0600   GLUCOSE 89  01/08/2012 0600   BUN 4* 01/08/2012 0600   CREATININE 0.87 01/08/2012 0600   CALCIUM 9.6 01/08/2012 0600   PROT 7.4 01/07/2012 0558   ALBUMIN 3.1* 01/07/2012 0558   AST 40* 01/07/2012 0558   ALT 35 01/07/2012 0558   ALKPHOS 73 01/07/2012 0558   BILITOT 0.5 01/07/2012 0558   GFRNONAA >90 01/08/2012 0600   GFRAA >90 01/08/2012 0600   Magnesium  Date/Time Value Range Status  01/08/2012  6:00 AM 1.7  1.5 - 2.5 mg/dL Final   Phosphorus  Date/Time Value Range Status  01/08/2012  6:00 AM 4.3  2.3 - 4.6 mg/dL Final      Intake/Output Summary (Last 24 hours) at 01/09/12 1555 Last data filed at 01/09/12 0900  Gross per 24 hour  Intake    320 ml  Output   3850 ml  Net  -3530 ml    Weight Status:  135 lbs (7/2) stable with admission weight   Re-estimated needs:  1650-1800 kcal, 70-80 gm protein  Nutrition Dx:  Inadequate protein-energy intake r/t alcoholism and poor nutrition QOL AEB dietary recall of very limited protein intake   Goal:  Pt to consume at least 90% of estimated protein and kcal needs -met    Monitor:  PO intake, weight, labs   Clarene Duke RD, LDN Pager [phone] After Hours pager [phone]

## 2012-01-09 NOTE — Progress Notes (Signed)
Clinical Social Work  CSW attempted to meet with patient to discuss alcohol use and dc plans since PT is recommended 24 hour care. Patient was out of room at procedure. CSW will follow up at a later time.  [place], [place] [phone] (Coverage for Jacklynn Lewis)

## 2012-01-09 NOTE — H&P (View-Only) (Signed)
Pt transferred to 5523 from 2900. Report received prior to arrival. Bed alarm placed and pt in camera room. Sitter present until 1900. IV saline locked with s/s of infection or infiltration.

## 2012-01-13 ENCOUNTER — Encounter (HOSPITAL_COMMUNITY): Payer: Self-pay | Admitting: Gastroenterology

## 2012-01-30 ENCOUNTER — Emergency Department (HOSPITAL_COMMUNITY)
Admission: EM | Admit: 2012-01-30 | Discharge: 2012-01-31 | Disposition: A | Discharge status: Home / Self Care | Payer: Medicaid Other | Attending: Emergency Medicine | Admitting: Emergency Medicine

## 2012-01-30 ENCOUNTER — Encounter (HOSPITAL_COMMUNITY): Payer: Self-pay | Admitting: *Deleted

## 2012-01-30 DIAGNOSIS — K7460 Unspecified cirrhosis of liver: Secondary | ICD-10-CM | POA: Insufficient documentation

## 2012-01-30 DIAGNOSIS — Z8673 Personal history of transient ischemic attack (TIA), and cerebral infarction without residual deficits: Secondary | ICD-10-CM | POA: Insufficient documentation

## 2012-01-30 DIAGNOSIS — Z8701 Personal history of pneumonia (recurrent): Secondary | ICD-10-CM | POA: Insufficient documentation

## 2012-01-30 DIAGNOSIS — F1010 Alcohol abuse, uncomplicated: Secondary | ICD-10-CM | POA: Insufficient documentation

## 2012-01-30 DIAGNOSIS — E785 Hyperlipidemia, unspecified: Secondary | ICD-10-CM | POA: Insufficient documentation

## 2012-01-30 DIAGNOSIS — J438 Other emphysema: Secondary | ICD-10-CM | POA: Insufficient documentation

## 2012-01-30 DIAGNOSIS — F17200 Nicotine dependence, unspecified, uncomplicated: Secondary | ICD-10-CM | POA: Insufficient documentation

## 2012-01-30 DIAGNOSIS — I10 Essential (primary) hypertension: Secondary | ICD-10-CM | POA: Insufficient documentation

## 2012-01-30 DIAGNOSIS — Z008 Encounter for other general examination: Secondary | ICD-10-CM | POA: Insufficient documentation

## 2012-01-30 LAB — CBC WITH DIFFERENTIAL/PLATELET
Basophils Absolute: 0 10*3/uL (ref 0.0–0.1)
Eosinophils Absolute: 0 10*3/uL (ref 0.0–0.7)
HCT: 29.3 % — ABNORMAL LOW (ref 39.0–52.0)
Lymphocytes Relative: 65 % — ABNORMAL HIGH (ref 12–46)
MCHC: 34.5 g/dL (ref 30.0–36.0)
Monocytes Relative: 16 % — ABNORMAL HIGH (ref 3–12)
Neutro Abs: 0.5 10*3/uL — ABNORMAL LOW (ref 1.7–7.7)
Platelets: 137 10*3/uL — ABNORMAL LOW (ref 150–400)
RDW: 16.5 % — ABNORMAL HIGH (ref 11.5–15.5)
WBC: 3 10*3/uL — ABNORMAL LOW (ref 4.0–10.5)

## 2012-01-30 LAB — COMPREHENSIVE METABOLIC PANEL
Albumin: 3.7 g/dL (ref 3.5–5.2)
BUN: 4 mg/dL — ABNORMAL LOW (ref 6–23)
Calcium: 9 mg/dL (ref 8.4–10.5)
GFR calc Af Amer: >90 mL/min (ref >90)
Glucose, Bld: 96 mg/dL (ref 70–99)
Potassium: 3.9 mEq/L (ref 3.5–5.1)
Sodium: 130 mEq/L — ABNORMAL LOW (ref 135–145)
Total Protein: 8.1 g/dL (ref 6.0–8.3)

## 2012-01-30 LAB — URINALYSIS, ROUTINE W REFLEX MICROSCOPIC
Leukocytes, UA: NEGATIVE
Nitrite: NEGATIVE
Specific Gravity, Urine: 1.012 (ref 1.005–1.030)
Urobilinogen, UA: 0.2 mg/dL (ref 0.0–1.0)
pH: 5.5 (ref 5.0–8.0)

## 2012-01-30 LAB — ETHANOL: Alcohol, Ethyl (B): 323 mg/dL — ABNORMAL HIGH (ref 0–11)

## 2012-01-30 LAB — RAPID URINE DRUG SCREEN, HOSP PERFORMED
Barbiturates: NOT DETECTED
Benzodiazepines: NOT DETECTED

## 2012-01-30 MED ORDER — ADULT MULTIVITAMIN W/MINERALS CH
1.0000 | ORAL_TABLET | Freq: Every day | ORAL | Status: DC
  Administered 2012-01-30 – 2012-01-31 (×2): 1 via ORAL
  Filled 2012-01-30 (×2): qty 1

## 2012-01-30 MED ORDER — HYDROCODONE-ACETAMINOPHEN 5-325 MG PO TABS: 1.0000 | ORAL_TABLET | Freq: Four times a day (QID) | ORAL | Status: DC | PRN

## 2012-01-30 MED ORDER — VITAMIN B-1 100 MG PO TABS
100.0000 mg | ORAL_TABLET | Freq: Every day | ORAL | Status: DC
  Administered 2012-01-30 – 2012-01-31 (×2): 100 mg via ORAL
  Filled 2012-01-30 (×2): qty 1

## 2012-01-30 MED ORDER — ALUM & MAG HYDROXIDE-SIMETH 200-200-20 MG/5ML PO SUSP: 30.0000 mL | ORAL | Status: DC | PRN

## 2012-01-30 MED ORDER — ONDANSETRON HCL 4 MG PO TABS: 4.0000 mg | ORAL_TABLET | Freq: Three times a day (TID) | ORAL | Status: DC | PRN

## 2012-01-30 MED ORDER — BUDESONIDE-FORMOTEROL FUMARATE 80-4.5 MCG/ACT IN AERO
2.0000 | INHALATION_SPRAY | Freq: Two times a day (BID) | RESPIRATORY_TRACT | Status: DC
  Administered 2012-01-30 – 2012-01-31 (×2): 2 via RESPIRATORY_TRACT
  Filled 2012-01-30: qty 6.9

## 2012-01-30 MED ORDER — LORAZEPAM 2 MG/ML IJ SOLN: 1.0000 mg | Freq: Four times a day (QID) | INTRAMUSCULAR | Status: DC | PRN

## 2012-01-30 MED ORDER — TRAMADOL HCL 50 MG PO TABS: 50.0000 mg | ORAL_TABLET | Freq: Two times a day (BID) | ORAL | Status: DC | PRN

## 2012-01-30 MED ORDER — THIAMINE HCL 100 MG/ML IJ SOLN: 100.0000 mg | Freq: Every day | INTRAMUSCULAR | Status: DC

## 2012-01-30 MED ORDER — NICOTINE 21 MG/24HR TD PT24
21.0000 mg | MEDICATED_PATCH | Freq: Every day | TRANSDERMAL | Status: DC
  Administered 2012-01-30 – 2012-01-31 (×2): 21 mg via TRANSDERMAL
  Filled 2012-01-30 (×2): qty 1

## 2012-01-30 MED ORDER — IBUPROFEN 200 MG PO TABS: 600.0000 mg | ORAL_TABLET | Freq: Three times a day (TID) | ORAL | Status: DC | PRN

## 2012-01-30 MED ORDER — ZOLPIDEM TARTRATE 5 MG PO TABS: 5.0000 mg | ORAL_TABLET | Freq: Every evening | ORAL | Status: DC | PRN

## 2012-01-30 MED ORDER — SIMVASTATIN 5 MG PO TABS
5.0000 mg | ORAL_TABLET | Freq: Every day | ORAL | Status: DC
  Administered 2012-01-30 – 2012-01-31 (×2): 5 mg via ORAL
  Filled 2012-01-30 (×3): qty 1

## 2012-01-30 MED ORDER — ACETAMINOPHEN 325 MG PO TABS: 650.0000 mg | ORAL_TABLET | ORAL | Status: DC | PRN

## 2012-01-30 MED ORDER — LORAZEPAM 1 MG PO TABS
1.0000 mg | ORAL_TABLET | Freq: Four times a day (QID) | ORAL | Status: DC | PRN
  Administered 2012-01-31: 1 mg via ORAL
  Filled 2012-01-30: qty 1
  Filled 2012-01-30: qty 2

## 2012-01-30 MED ORDER — FOLIC ACID 1 MG PO TABS
1.0000 mg | ORAL_TABLET | Freq: Every day | ORAL | Status: DC
  Administered 2012-01-30 – 2012-01-31 (×2): 1 mg via ORAL
  Filled 2012-01-30 (×2): qty 1

## 2012-01-30 MED ORDER — ALBUTEROL SULFATE HFA 108 (90 BASE) MCG/ACT IN AERS: 2.0000 | INHALATION_SPRAY | Freq: Four times a day (QID) | RESPIRATORY_TRACT | Status: DC | PRN

## 2012-01-30 NOTE — ED Provider Notes (Signed)
History     CSN: [id number]  Arrival date & time 01/30/12  1548   First MD Initiated Contact with Patient 01/30/12 1558      Chief Complaint  Patient presents with  . Medical Clearance    (Consider location/radiation/quality/duration/timing/severity/associated sxs/prior treatment) HPI  Pt status: Voluntary, no HI or SI  Patient presents to the emergency department with wife and caregiver requesting detox from alcohol. He has a significant past medical history of stroke, emphysema, cirrhosis of liver, right foot fracture, and requires total self-care help. He states he's been treated since he was 61 years old, he is tried to quit drinking in the past. He states that he last had a drink a couple of hours ago. NAD/VSS  Past Medical History  Diagnosis Date  . Hypertension   . Hyperlipemia   . Emphysema   . Stroke   . Chronic pain   . Cirrhosis of liver   . Fall   . Dental injury   . Bone spur of other site     Left Foot  . Impaired mobility   . Total self care deficit   . Pneumonia     Past Surgical History  Procedure Date  . No past surgeries   . Colonoscopy 01/06/2012    Procedure: COLONOSCOPY;  Surgeon: Theda Belfast, MD;  Location: [facility] ENDOSCOPY;  Service: Endoscopy;  Laterality: N/A;  . Esophagogastroduodenoscopy 01/06/2012    Procedure: ESOPHAGOGASTRODUODENOSCOPY (EGD);  Surgeon: Theda Belfast, MD;  Location: [facility] ENDOSCOPY;  Service: Endoscopy;  Laterality: N/A;  . Esophagogastroduodenoscopy 01/09/2012    Procedure: ESOPHAGOGASTRODUODENOSCOPY (EGD);  Surgeon: Theda Belfast, MD;  Location: [facility] ENDOSCOPY;  Service: Endoscopy;  Laterality: N/A;    No family history on file.  History  Substance Use Topics  . Smoking status: Current Everyday Smoker -- 1.0 packs/day for 30 years    Types: Cigarettes  . Smokeless tobacco: Not on file  . Alcohol Use: Yes      Review of Systems   HEENT: denies blurry vision or change in hearing PULMONARY: Denies difficulty breathing  and SOB CARDIAC: denies chest pain or heart palpitations MUSCULOSKELETAL:  denies being unable to ambulate without a cane ABDOMEN AL: denies abdominal pain GU: denies loss of bowel or urinary control NEURO: denies numbness and tingling in extremities SKIN: no new rashes PSYCH: patient denies anxiety or depression. NECK: Pt denies having neck pain     Allergies  Poison ivy extract  Home Medications   Current Outpatient Rx  Name Route Sig Dispense Refill  . ALBUTEROL SULFATE HFA 108 (90 BASE) MCG/ACT IN AERS Inhalation Inhale 2 puffs into the lungs every 6 (six) hours as needed. Shortness of breath    . BUDESONIDE-FORMOTEROL FUMARATE 80-4.5 MCG/ACT IN AERO Inhalation Inhale 2 puffs into the lungs 2 (two) times daily. 1 Inhaler 0  . FERROUS SULFATE 325 (65 FE) MG PO TABS Oral Take 325 mg by mouth daily with breakfast.    . FOLIC ACID 1 MG PO TABS Oral Take 1 mg by mouth daily.    Marland Kitchen HYDROCODONE-ACETAMINOPHEN 7.5-500 MG PO TABS Oral Take 1 tablet by mouth every 6 (six) hours as needed. For pain    . NICOTINE 7 MG/24HR TD PT24 Transdermal Place 1 patch onto the skin daily. 14 patch 0  . PRAVASTATIN SODIUM 40 MG PO TABS Oral Take 40 mg by mouth daily.    Marland Kitchen VITAMIN B-1 100 MG PO TABS Oral Take 100 mg by mouth daily.    Marland Kitchen  TRAMADOL HCL 50 MG PO TABS Oral Take 50 mg by mouth 2 (two) times daily as needed. For pain    . VITAMIN B-12 100 MCG PO TABS Oral Take 100 mcg by mouth daily.    Marland Kitchen VITAMIN E 200 UNITS PO CAPS Oral Take 200 Units by mouth daily.      BP 127/84  Pulse 104  Temp 97.8 F (36.6 C) (Oral)  Resp 18  SpO2 99%  Physical Exam  Nursing note and vitals reviewed. Constitutional: He appears well-developed and well-nourished. No distress.       Pt older than he appears  HENT:  Head: Normocephalic and atraumatic.  Mouth/Throat: Dental caries (pt missing many teeth) present.  Eyes: Pupils are equal, round, and reactive to light.  Neck: Normal range of motion. Neck supple.    Cardiovascular: Normal rate and regular rhythm.   Pulmonary/Chest: Effort normal.  Abdominal: Soft.  Musculoskeletal:       Cam Walker boot on right foot- foot fx  Neurological: He is alert.  Skin: Skin is warm and dry.    ED Course  Procedures (including critical care time)   Labs Reviewed  CBC WITH DIFFERENTIAL  COMPREHENSIVE METABOLIC PANEL  ETHANOL  URINALYSIS, ROUTINE W REFLEX MICROSCOPIC  URINE RAPID DRUG SCREEN (HOSP PERFORMED)   No results found.   1. Alcohol abuse       MDM  ACT to evaluate and alcohol detox protocol ordered.         Dorthula Matas, PA 01/30/12 [phone]

## 2012-01-30 NOTE — ED Notes (Signed)
Pt is wanded and in blue scrubs. Pt has one black bag and one patient belongings bag

## 2012-01-30 NOTE — ED Notes (Signed)
Pt states he drinks 3 40oz beers a day. Pt states he wants detox and was sent by his pcp. Pt states his last drink was a couple of hours ago

## 2012-01-30 NOTE — BH Assessment (Signed)
Assessment Note   Peter [name] is a 61 y.o. male who presents to [facility] voluntarily requesting detox. Pt states he has been drinking 2 40 ounce beers daily for the past 3 years. Pt reports seeking detox today because his PCP recommended detox followed by joining AA. Pt reports a family history of addiction and substance abuse. He reports no recent stressors. He states he lives with his bother and a nurses aid who helps his brother and him "with paperwork." Pt is able to complete his ADLs without assistance. He reports no current withdrawal symptoms. He states his last drink was 7/25, however pt's blood alcohol was 323 @1614 .     Pt denies SI, HI, and AHVH. Pt denies any prior mental health concerns and has no outpatient providers. Pt states he is motivated to change.      Axis I: Alcohol Dependence Axis II: Deferred Axis III:  Past Medical History  Diagnosis Date  . Hypertension   . Hyperlipemia   . Emphysema   . Stroke   . Chronic pain   . Cirrhosis of liver   . Fall   . Dental injury   . Bone spur of other site     Left Foot  . Impaired mobility   . Total self care deficit   . Pneumonia    Axis IV: other psychosocial or environmental problems Axis V: 51-60 moderate symptoms  Past Medical History:  Past Medical History  Diagnosis Date  . Hypertension   . Hyperlipemia   . Emphysema   . Stroke   . Chronic pain   . Cirrhosis of liver   . Fall   . Dental injury   . Bone spur of other site     Left Foot  . Impaired mobility   . Total self care deficit   . Pneumonia     Past Surgical History  Procedure Date  . No past surgeries   . Colonoscopy 01/06/2012    Procedure: COLONOSCOPY;  Surgeon: Theda Belfast, MD;  Location: [facility] ENDOSCOPY;  Service: Endoscopy;  Laterality: N/A;  . Esophagogastroduodenoscopy 01/06/2012    Procedure: ESOPHAGOGASTRODUODENOSCOPY (EGD);  Surgeon: Theda Belfast, MD;  Location: [facility] ENDOSCOPY;  Service: Endoscopy;  Laterality: N/A;  .  Esophagogastroduodenoscopy 01/09/2012    Procedure: ESOPHAGOGASTRODUODENOSCOPY (EGD);  Surgeon: Theda Belfast, MD;  Location: [facility] ENDOSCOPY;  Service: Endoscopy;  Laterality: N/A;    Family History: No family history on file.  Social History:  reports that he has been smoking Cigarettes.  He has a 30 pack-year smoking history. He does not have any smokeless tobacco history on file. He reports that he drinks alcohol. He reports that he does not use illicit drugs.  Additional Social History:  Alcohol / Drug Use History of alcohol / drug use?: Yes Substance #1 Name of Substance 1: alcohol 1 - Age of First Use: teens 1 - Amount (size/oz): 2 40 ounce beers 1 - Frequency: daily 1 - Duration: states 3 years 1 - Last Use / Amount: 01/29/12  CIWA: CIWA-Ar BP: 116/79 mmHg Pulse Rate: 92  Nausea and Vomiting: no nausea and no vomiting Tactile Disturbances: none Tremor: no tremor Auditory Disturbances: not present Paroxysmal Sweats: no sweat visible Visual Disturbances: not present Anxiety: no anxiety, at ease Headache, Fullness in Head: none present Agitation: normal activity Orientation and Clouding of Sensorium: cannot do serial additions or is uncertain about date CIWA-Ar Total: 1  COWS:    Allergies:  Allergies  Allergen Reactions  . Poison  Ivy Extract (Extract Of Poison Ivy) Rash    Home Medications:  (Not in a hospital admission)  OB/GYN Status:  No LMP for male patient.  General Assessment Data Location of Assessment: [facility] ED Living Arrangements: Non-relatives/Friends (lives with brother) Can pt return to current living arrangement?: Yes Admission Status: Voluntary Is patient capable of signing voluntary admission?: Yes Transfer from: Acute Hospital Referral Source: MD  Education Status Is patient currently in school?: No  Risk to self Suicidal Ideation: No Suicidal Intent: No Is patient at risk for suicide?: No Suicidal Plan?: No Access to Means: No What has  been your use of drugs/alcohol within the last 12 months?: alcohol Previous Attempts/Gestures: No How many times?: 0  Other Self Harm Risks: none Triggers for Past Attempts: None known Intentional Self Injurious Behavior: None Family Suicide History: No Recent stressful life event(s): Other (Comment) (none noted) Persecutory voices/beliefs?: No Depression: Yes Depression Symptoms: Loss of interest in usual pleasures Substance abuse history and/or treatment for substance abuse?: Yes Suicide prevention information given to non-admitted patients: Not applicable  Risk to Others Homicidal Ideation: No Thoughts of Harm to Others: No Current Homicidal Intent: No Current Homicidal Plan: No Access to Homicidal Means: No Identified Victim: none History of harm to others?: No Assessment of Violence: None Noted Violent Behavior Description: cooperative Does patient have access to weapons?: No Criminal Charges Pending?: No Does patient have a court date: No  Psychosis Hallucinations: None noted Delusions: None noted  Mental Status Report Appear/Hygiene: Disheveled Eye Contact: Fair Motor Activity: Unremarkable Speech: Logical/coherent Level of Consciousness: Quiet/awake Mood: Depressed Affect: Appropriate to circumstance;Depressed Anxiety Level: None Thought Processes: Coherent;Relevant Judgement: Impaired Orientation: Person;Place;Time;Situation Obsessive Compulsive Thoughts/Behaviors: None  Cognitive Functioning Concentration: Normal Memory: Remote Intact;Recent Intact IQ: Average Insight: Poor Impulse Control: Poor Appetite: Fair Weight Loss: 0  Weight Gain: 0  Sleep: Decreased Vegetative Symptoms: None  ADLScreening [facility] Assessment Services) Patient's cognitive ability adequate to safely complete daily activities?: Yes Patient able to express need for assistance with ADLs?: Yes Independently performs ADLs?: Yes  Abuse/Neglect [facility]) Physical Abuse: Denies Verbal  Abuse: Denies Sexual Abuse: Denies  Prior Inpatient Therapy Prior Inpatient Therapy: Yes Prior Therapy Dates: 2011 and 2012 Prior Therapy Facilty/Provider(s): [facility] and [facility] Reason for Treatment: detox  Prior Outpatient Therapy Prior Outpatient Therapy: Yes Prior Therapy Dates: AA Prior Therapy Facilty/Provider(s): unknown Reason for Treatment: SA  ADL Screening (condition at time of admission) Patient's cognitive ability adequate to safely complete daily activities?: Yes Patient able to express need for assistance with ADLs?: Yes Independently performs ADLs?: Yes Communication: Independent Dressing (OT): Independent Grooming: Independent Feeding: Independent Bathing: Independent Toileting: Independent In/Out Bed: Independent Walks in Home: Needs assistance Is this a change from baseline?: Pre-admission baseline  Home Assistive Devices/Equipment Home Assistive Devices/Equipment: Cane (specify quad or straight) (pt states he broke his foot and has to walk with a cane)    Abuse/Neglect Assessment (Assessment to be complete while patient is alone) Physical Abuse: Denies Verbal Abuse: Denies Sexual Abuse: Denies Exploitation of patient/patient's resources: Denies Self-Neglect: Denies Values / Beliefs Cultural Requests During Hospitalization: None Spiritual Requests During Hospitalization: None   Advance Directives (For Healthcare) Advance Directive: Patient does not have advance directive;Patient would not like information Pre-existing out of facility DNR order (yellow form or pink MOST form): No Nutrition Screen Diet: Regular Unintentional weight loss greater than 10lbs within the last month: No Problems chewing or swallowing foods and/or liquids: No Home Tube Feeding or Total Parenteral Nutrition (TPN): No Patient appears  severely malnourished: No  Additional Information 1:1 In Past 12 Months?: No CIRT Risk: No Elopement Risk: No Does patient have medical  clearance?: Yes     Disposition:  Disposition Disposition of Patient: Referred to;Inpatient treatment program Type of inpatient treatment program: Adult Patient referred to:  [facility])  On Site Evaluation by:   Reviewed with Physician:     Georgina Quint A 01/30/2012 10:05 PM

## 2012-01-31 ENCOUNTER — Inpatient Hospital Stay (HOSPITAL_COMMUNITY)
Admission: RE | Admit: 2012-01-31 | Discharge: 2012-02-04 | DRG: 897 | Disposition: A | Discharge status: Home / Self Care | Payer: Medicaid Other | Source: Ambulatory Visit | Attending: Psychiatry | Admitting: Psychiatry

## 2012-01-31 DIAGNOSIS — J690 Pneumonitis due to inhalation of food and vomit: Secondary | ICD-10-CM

## 2012-01-31 DIAGNOSIS — I10 Essential (primary) hypertension: Secondary | ICD-10-CM | POA: Diagnosis present

## 2012-01-31 DIAGNOSIS — R55 Syncope and collapse: Secondary | ICD-10-CM

## 2012-01-31 DIAGNOSIS — G8929 Other chronic pain: Secondary | ICD-10-CM | POA: Diagnosis present

## 2012-01-31 DIAGNOSIS — Z79899 Other long term (current) drug therapy: Secondary | ICD-10-CM

## 2012-01-31 DIAGNOSIS — E871 Hypo-osmolality and hyponatremia: Secondary | ICD-10-CM | POA: Diagnosis present

## 2012-01-31 DIAGNOSIS — M898X9 Other specified disorders of bone, unspecified site: Secondary | ICD-10-CM | POA: Diagnosis present

## 2012-01-31 DIAGNOSIS — R748 Abnormal levels of other serum enzymes: Secondary | ICD-10-CM | POA: Diagnosis present

## 2012-01-31 DIAGNOSIS — E785 Hyperlipidemia, unspecified: Secondary | ICD-10-CM | POA: Diagnosis present

## 2012-01-31 DIAGNOSIS — J189 Pneumonia, unspecified organism: Secondary | ICD-10-CM

## 2012-01-31 DIAGNOSIS — K7460 Unspecified cirrhosis of liver: Secondary | ICD-10-CM | POA: Diagnosis present

## 2012-01-31 DIAGNOSIS — E611 Iron deficiency: Secondary | ICD-10-CM | POA: Diagnosis present

## 2012-01-31 DIAGNOSIS — D649 Anemia, unspecified: Secondary | ICD-10-CM

## 2012-01-31 DIAGNOSIS — S2249XA Multiple fractures of ribs, unspecified side, initial encounter for closed fracture: Secondary | ICD-10-CM

## 2012-01-31 DIAGNOSIS — J449 Chronic obstructive pulmonary disease, unspecified: Secondary | ICD-10-CM

## 2012-01-31 DIAGNOSIS — E876 Hypokalemia: Secondary | ICD-10-CM

## 2012-01-31 DIAGNOSIS — F10939 Alcohol use, unspecified with withdrawal, unspecified: Principal | ICD-10-CM | POA: Diagnosis present

## 2012-01-31 DIAGNOSIS — D72829 Elevated white blood cell count, unspecified: Secondary | ICD-10-CM

## 2012-01-31 DIAGNOSIS — IMO0001 Reserved for inherently not codable concepts without codable children: Secondary | ICD-10-CM

## 2012-01-31 DIAGNOSIS — F1020 Alcohol dependence, uncomplicated: Secondary | ICD-10-CM | POA: Diagnosis present

## 2012-01-31 DIAGNOSIS — F10239 Alcohol dependence with withdrawal, unspecified: Principal | ICD-10-CM

## 2012-01-31 DIAGNOSIS — J438 Other emphysema: Secondary | ICD-10-CM | POA: Diagnosis present

## 2012-01-31 DIAGNOSIS — Z8673 Personal history of transient ischemic attack (TIA), and cerebral infarction without residual deficits: Secondary | ICD-10-CM

## 2012-01-31 LAB — ETHANOL: Alcohol, Ethyl (B): <11 mg/dL (ref 0–11)

## 2012-01-31 MED ORDER — CHLORDIAZEPOXIDE HCL 25 MG PO CAPS
25.0000 mg | ORAL_CAPSULE | ORAL | Status: AC
  Administered 2012-02-03 – 2012-02-04 (×2): 25 mg via ORAL
  Filled 2012-01-31: qty 1

## 2012-01-31 MED ORDER — ADULT MULTIVITAMIN W/MINERALS CH
1.0000 | ORAL_TABLET | Freq: Every day | ORAL | Status: DC
  Administered 2012-02-01 – 2012-02-04 (×4): 1 via ORAL
  Filled 2012-01-31 (×5): qty 1

## 2012-01-31 MED ORDER — ALUM & MAG HYDROXIDE-SIMETH 200-200-20 MG/5ML PO SUSP: 30.0000 mL | ORAL | Status: DC | PRN

## 2012-01-31 MED ORDER — MAGNESIUM HYDROXIDE 400 MG/5ML PO SUSP: 30.0000 mL | Freq: Every day | ORAL | Status: DC | PRN

## 2012-01-31 MED ORDER — VITAMIN B-1 100 MG PO TABS
100.0000 mg | ORAL_TABLET | Freq: Every day | ORAL | Status: DC
  Administered 2012-02-01 – 2012-02-04 (×4): 100 mg via ORAL
  Filled 2012-01-31 (×5): qty 1

## 2012-01-31 MED ORDER — NICOTINE 7 MG/24HR TD PT24
7.0000 mg | MEDICATED_PATCH | Freq: Every day | TRANSDERMAL | Status: DC
  Administered 2012-02-01: 7 mg via TRANSDERMAL
  Filled 2012-01-31 (×4): qty 1

## 2012-01-31 MED ORDER — FERROUS SULFATE 325 (65 FE) MG PO TABS
325.0000 mg | ORAL_TABLET | Freq: Every day | ORAL | Status: DC
  Administered 2012-02-01 – 2012-02-04 (×4): 325 mg via ORAL
  Filled 2012-01-31 (×5): qty 1

## 2012-01-31 MED ORDER — HYDROXYZINE HCL 25 MG PO TABS: 25.0000 mg | ORAL_TABLET | Freq: Four times a day (QID) | ORAL | Status: AC | PRN

## 2012-01-31 MED ORDER — BUDESONIDE-FORMOTEROL FUMARATE 80-4.5 MCG/ACT IN AERO
2.0000 | INHALATION_SPRAY | Freq: Two times a day (BID) | RESPIRATORY_TRACT | Status: DC
  Administered 2012-02-01 – 2012-02-04 (×7): 2 via RESPIRATORY_TRACT
  Filled 2012-01-31: qty 6.9

## 2012-01-31 MED ORDER — VITAMIN B-12 100 MCG PO TABS
100.0000 ug | ORAL_TABLET | Freq: Every day | ORAL | Status: DC
  Administered 2012-02-01 – 2012-02-04 (×4): 100 ug via ORAL
  Filled 2012-01-31 (×5): qty 1

## 2012-01-31 MED ORDER — LORAZEPAM 1 MG PO TABS
2.0000 mg | ORAL_TABLET | Freq: Once | ORAL | Status: AC
  Administered 2012-01-31: 2 mg via ORAL

## 2012-01-31 MED ORDER — CHLORDIAZEPOXIDE HCL 25 MG PO CAPS
25.0000 mg | ORAL_CAPSULE | Freq: Three times a day (TID) | ORAL | Status: AC
  Administered 2012-02-02 – 2012-02-03 (×3): 25 mg via ORAL
  Filled 2012-01-31 (×3): qty 1

## 2012-01-31 MED ORDER — CHLORDIAZEPOXIDE HCL 25 MG PO CAPS: 25.0000 mg | ORAL_CAPSULE | Freq: Every day | ORAL | Status: DC

## 2012-01-31 MED ORDER — FOLIC ACID 1 MG PO TABS
1.0000 mg | ORAL_TABLET | Freq: Every day | ORAL | Status: DC
  Administered 2012-02-01 – 2012-02-04 (×4): 1 mg via ORAL
  Filled 2012-01-31 (×5): qty 1

## 2012-01-31 MED ORDER — SIMVASTATIN 10 MG PO TABS
10.0000 mg | ORAL_TABLET | Freq: Every day | ORAL | Status: DC
  Administered 2012-02-01 – 2012-02-03 (×3): 10 mg via ORAL
  Filled 2012-01-31 (×4): qty 1

## 2012-01-31 MED ORDER — THIAMINE HCL 100 MG/ML IJ SOLN: 100.0000 mg | Freq: Once | INTRAMUSCULAR | Status: DC

## 2012-01-31 MED ORDER — TRAZODONE HCL 50 MG PO TABS
50.0000 mg | ORAL_TABLET | Freq: Every evening | ORAL | Status: DC | PRN
  Filled 2012-01-31: qty 7

## 2012-01-31 MED ORDER — ACETAMINOPHEN 325 MG PO TABS
650.0000 mg | ORAL_TABLET | Freq: Four times a day (QID) | ORAL | Status: DC | PRN
  Administered 2012-02-01: 650 mg via ORAL

## 2012-01-31 MED ORDER — CHLORDIAZEPOXIDE HCL 25 MG PO CAPS
25.0000 mg | ORAL_CAPSULE | Freq: Four times a day (QID) | ORAL | Status: AC
  Administered 2012-02-01 – 2012-02-02 (×4): 25 mg via ORAL
  Filled 2012-01-31 (×6): qty 1

## 2012-01-31 MED ORDER — ALBUTEROL SULFATE HFA 108 (90 BASE) MCG/ACT IN AERS
2.0000 | INHALATION_SPRAY | Freq: Four times a day (QID) | RESPIRATORY_TRACT | Status: DC | PRN
  Filled 2012-01-31 (×2): qty 6.7

## 2012-01-31 MED ORDER — CHLORDIAZEPOXIDE HCL 25 MG PO CAPS
25.0000 mg | ORAL_CAPSULE | Freq: Four times a day (QID) | ORAL | Status: AC | PRN
  Filled 2012-01-31: qty 1

## 2012-01-31 MED ORDER — LOPERAMIDE HCL 2 MG PO CAPS: 2.0000 mg | ORAL_CAPSULE | ORAL | Status: AC | PRN

## 2012-01-31 MED ORDER — ONDANSETRON 4 MG PO TBDP
4.0000 mg | ORAL_TABLET | Freq: Four times a day (QID) | ORAL | Status: AC | PRN
Start: 2012-01-31 — End: 2012-02-03

## 2012-01-31 NOTE — ED Notes (Signed)
Per [facility], patient has been accepted by [facility] Huger to Dr. Koren Shiver  Room Assignment: 302 Bed 1    Writer will notify pt's RN and MD of disposition.  Janann Colonel., MSW, [facility] Clinical Social Worker [phone]

## 2012-01-31 NOTE — Tx Team (Signed)
Initial Interdisciplinary Treatment Plan  PATIENT STRENGTHS: (choose at least two) Average or above average intelligence Motivation for treatment/growth Supportive family/friends  PATIENT STRESSORS: Financial difficulties Health problems Substance abuse   PROBLEM LIST: Problem List/Patient Goals Date to be addressed Date deferred Reason deferred Estimated date of resolution  Substance abuse (ETOH) 01/31/12     depression 01/31/12                                                DISCHARGE CRITERIA:  Motivation to continue treatment in a less acute level of care Need for constant or close observation no longer present Withdrawal symptoms are absent or subacute and managed without 24-hour nursing intervention  PRELIMINARY DISCHARGE PLAN: Attend PHP/IOP Attend 12-step recovery group Return to previous living arrangement  PATIENT/FAMIILY INVOLVEMENT: This treatment plan has been presented to and reviewed with the patient, Barton Fanny.  The patient and family have been given the opportunity to ask questions and make suggestions.  Juliann Pares 01/31/2012, 8:17 PM

## 2012-01-31 NOTE — Progress Notes (Addendum)
Pt is a 61 year old AAM admitted to the services of Dr. Koren Shiver for detox from alcohol.  Pt admits to drinking at least two 40 oz beers daily and has done so for the last eight years.  However Pt has stated that he has been drinking overall since 61 years old and his BAL was 323 in ED.  Pt has extensive medical issues and has suffered a recent fall in the bathroom from which he received a fractured right foot, three fractured ribs, and right tooth injury.  Pt is ambulating with a cane and has an orthopedic boot on his right foot.  Pt has medical hx of COPD, stroke, and cirrhosis of the liver.  He is a high fall risk.  The pt lists Peter [name] [phone] (home) and Peter [name] [phone]) as his emergency contacts.

## 2012-01-31 NOTE — ED Notes (Signed)
Rounded on pt. Pt lying in bed with eyes closed. resp even and unlabored. environment safe.

## 2012-01-31 NOTE — ED Notes (Signed)
Report called and given to Gena Fray at [facility].

## 2012-01-31 NOTE — ED Provider Notes (Signed)
Medical screening examination/treatment/procedure(s) were performed by non-physician practitioner and as supervising physician I was immediately available for consultation/collaboration.  Maribelle Hopple, MD 01/31/12 0000 

## 2012-01-31 NOTE — ED Provider Notes (Signed)
Filed Vitals:   01/31/12 0523  BP: 108/72  Pulse: 123  Temp: 99.1 F (37.3 C)  Resp: 16   Pt seen and assessed. NAD and no complaints. Seems somewhat shaky on exam and tachycardic. Pt encouraged to drink fluids. Ativan given. On etoh withdrawal protocol. No AMS or hallucinations. Will continue to observe.  Raeford Razor, MD 01/31/12 [phone]

## 2012-02-01 DIAGNOSIS — F1010 Alcohol abuse, uncomplicated: Secondary | ICD-10-CM

## 2012-02-01 MED ORDER — NICOTINE 21 MG/24HR TD PT24
21.0000 mg | MEDICATED_PATCH | Freq: Every day | TRANSDERMAL | Status: DC
  Administered 2012-02-02 – 2012-02-04 (×3): 21 mg via TRANSDERMAL
  Filled 2012-02-01 (×6): qty 1

## 2012-02-01 MED ORDER — ACAMPROSATE CALCIUM 333 MG PO TBEC
666.0000 mg | DELAYED_RELEASE_TABLET | Freq: Three times a day (TID) | ORAL | Status: DC
  Administered 2012-02-01 – 2012-02-04 (×10): 666 mg via ORAL
  Filled 2012-02-01 (×12): qty 2

## 2012-02-01 NOTE — Progress Notes (Signed)
Pt attended the speaker AA meeting and was attentive and engaged. 

## 2012-02-01 NOTE — H&P (Signed)
  Pt was seen by me today and I agree with the key elements documented in H&P.  

## 2012-02-01 NOTE — BHH Counselor (Signed)
Adult Comprehensive Assessment  Patient ID: Peter [name], male   DOB: 1950-09-04, 61 y.o.   MRN: [id number]  Information Source: Information source: Patient  Current Stressors:  Educational / Learning stressors: None reported Employment / Job issues: None reported Family Relationships: None reported Surveyor, quantity / Lack of resources (include bankruptcy): None reported Housing / Lack of housing: Lives with family Physical health (include injuries & life threatening diseases): Had 3 strokes Social relationships: None reported Substance abuse: Alcohol abuse Bereavement / Loss: None reported  Living/Environment/Situation:  Living Arrangements: Other relatives (Brother) Living conditions (as described by patient or guardian): Good How long has patient lived in current situation?: 4 yrs. What is atmosphere in current home: Comfortable  Family History:  Marital status: Separated Separated, when?: 8 yrs. What types of issues is patient dealing with in the relationship?: Not agreeing  Does patient have children?: Yes How many children?: 1  How is patient's relationship with their children?: Ok  Childhood History:  By whom was/is the patient raised?: Mother Additional childhood history information: None reported Description of patient's relationship with caregiver when they were a child: Good Patient's description of current relationship with people who raised him/her: [place] Does patient have siblings?: Yes Number of Siblings: 5  Description of patient's current relationship with siblings: [place] Did patient suffer any verbal/emotional/physical/sexual abuse as a child?: No Did patient suffer from severe childhood neglect?: No Has patient ever been sexually abused/assaulted/raped as an adolescent or adult?: No Was the patient ever a victim of a crime or a disaster?: No Witnessed domestic violence?: No Has patient been effected by domestic violence as an adult?: No  Education:  Highest  grade of school patient has completed: 12th grade Currently a student?: No Learning disability?: Yes What learning problems does patient have?: Not able to concentrate  Employment/Work Situation:   Employment situation: On disability Why is patient on disability: 3 strokes How long has patient been on disability: None reported Patient's job has been impacted by current illness: No What is the longest time patient has a held a job?: None reported Where was the patient employed at that time?: None reported Has patient ever been in the [employer]?: No Has patient ever served in Buyer, retail?: No  Financial Resources:   Surveyor, quantity resources: Writer Does patient have a Lawyer or guardian?: No  Alcohol/Substance Abuse:   What has been your use of drugs/alcohol within the last 12 months?: Alcohol-2 40 ounces every other day If attempted suicide, did drugs/alcohol play a role in this?: No Alcohol/Substance Abuse Treatment Hx: Past Tx, Inpatient If yes, describe treatment: 1 yrs. ago- [facility] Has alcohol/substance abuse ever caused legal problems?: No  Social Support System:   Conservation officer, nature Support System: Good Describe Community Support System: Parents Type of faith/religion: Baptist How does patient's faith help to cope with current illness?: Pray  Leisure/Recreation:   Leisure and Hobbies: Fish  Strengths/Needs:   What things does the patient do well?: Have good mental capacity In what areas does patient struggle / problems for patient: Moving around, Vision, Reading and writing  Discharge Plan:   Does patient have access to transportation?: Yes (Mother or Wife) Will patient be returning to same living situation after discharge?: Yes Currently receiving community mental health services: Yes (From Whom) ([facility]) Does patient have financial barriers related to discharge medications?: No  Summary/Recommendations:   Summary and Recommendations (to be  completed by the evaluator): Pt. is a 61 yr. old male.  Recommendations for treatment include  crisis stabilization, case mgmt., medication mgmt., psycoeducation to teach coping skills and group therapy.  Rhunette Croft. 02/01/2012

## 2012-02-01 NOTE — Progress Notes (Signed)
Psychoeducational Group Note  Date:  02/01/2012 Time:  1000am  Group Topic/Focus:  Making Healthy Choices:   The focus of this group is to help patients identify negative/unhealthy choices they were using prior to admission and identify positive/healthier coping strategies to replace them upon discharge.  Participation Level:  Active  Participation Quality:  Appropriate  Affect:  Appropriate  Cognitive:  Appropriate  Insight:  Good  Engagement in Group:  Good  Additional Comments:    Valente David 02/01/2012,11:33 AM

## 2012-02-01 NOTE — BHH Suicide Risk Assessment (Signed)
Suicide Risk Assessment  Admission Assessment     Demographic factors:  Assessment Details Time of Assessment: Admission Information Obtained From: Patient Current Mental Status:  Current Mental Status:  (adamentally denies) Loss Factors:  Loss Factors: Decline in physical health;Financial problems / change in socioeconomic status Historical Factors:  Historical Factors: Family history of mental illness or substance abuse Risk Reduction Factors:  Risk Reduction Factors: Sense of responsibility to family;Living with another person, especially a relative;Positive social support  CLINICAL FACTORS:   Alcohol/Substance Abuse/Dependencies  COGNITIVE FEATURES THAT CONTRIBUTE TO RISK:  Closed-mindedness    SUICIDE RISK:   Mild:  Suicidal ideation of limited frequency, intensity, duration, and specificity.  There are no identifiable plans, no associated intent, mild dysphoria and related symptoms, good self-control (both objective and subjective assessment), few other risk factors, and identifiable protective factors, including available and accessible social support.  PLAN OF CARE: Mental Status Examination/Evaluation:  Objective: Appearance: Fairly Groomed looks much older than stated age   Psychomotor Activity: Uses a cane -residual from stroke   Eye Contact: Good   Speech: Clear and Coherent and Normal Rate   Volume: Normal   Mood:euthymic   Affect: Appropriate   Thought Process: clear rational goal oriented   Orientation: Full   Thought Content: No AVH/psychosis   Suicidal Thoughts: No   Homicidal Thoughts: No   Judgement: Impaired   Insight: Shallow   DIAGNOSIS:  AXIS I  Alcohol Dep  AXIS II  Deferred   AXIS III  See medical history.   AXIS IV  other psychosocial or environmental problems   AXIS V  45   Treatment Plan Summary:  Admit for alcohol detox using the [facility] protocol,   Peter [name] 02/01/2012, 4:02 PM

## 2012-02-01 NOTE — Progress Notes (Signed)
Psychoeducational Group Note  Date:  02/01/2012 Time:  0315  Group Topic/Focus:  Making Healthy Choices:   The focus of this group is to help patients identify negative/unhealthy choices they were using prior to admission and identify positive/healthier coping strategies to replace them upon discharge.  Participation Level:  Did Not Attend  Dalia Heading 02/01/2012, 4:47 PM

## 2012-02-01 NOTE — Progress Notes (Signed)
Patient ID: Peter [name], male   DOB: [birth year], 61 y.o.   MRN: [id number] Pt. attended and participated in aftercare planning group. Pt. accepted information on suicide prevention, warning signs to look for with suicide and crisis line numbers to use. The pt. agreed to call crisis line numbers if having warning signs or having thoughts of suicide. Pt. listed their current anxiety and depression as feeling  "Pretty good"

## 2012-02-01 NOTE — H&P (Signed)
Psychiatric Admission Assessment Adult  Patient Identification:  Peter [name] Date of Evaluation:  02/01/2012 61yo MAAM CC: alcohol dependence  ETOH 323  History of Present Illness: Apparently his PCP recommended  he be admitted for a medically supervised detox. Says that he only drinks 2 40oz beers a day. Last detox was 2 years ago at [facility].   Substance Abuse History:  Social History:    reports that he has been smoking Cigarettes.  He has a 30 pack-year smoking history. He does not have any smokeless tobacco history on file. He reports that he drinks alcohol. He reports that he does not use illicit drugs. Drinks 2 40oz beers daily  HS grad 1972 married once has a son 59 yo. 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   Family Psych History: Sister drinks not sure that she is an alcoholic   Past Medical History:     Past Medical History  Diagnosis Date  . Hypertension   . Hyperlipemia   . Emphysema   . Stroke   . Chronic pain   . Cirrhosis of liver   . Fall   . Dental injury   . Bone spur of other site     Left Foot  . Impaired mobility   . Total self care deficit   . Pneumonia        Past Surgical History  Procedure Date  . No past surgeries   . Colonoscopy 01/06/2012    Procedure: COLONOSCOPY;  Surgeon: Theda Belfast, MD;  Location: [facility] ENDOSCOPY;  Service: Endoscopy;  Laterality: N/A;  . Esophagogastroduodenoscopy 01/06/2012    Procedure: ESOPHAGOGASTRODUODENOSCOPY (EGD);  Surgeon: Theda Belfast, MD;  Location: [facility] ENDOSCOPY;  Service: Endoscopy;  Laterality: N/A;  . Esophagogastroduodenoscopy 01/09/2012    Procedure: ESOPHAGOGASTRODUODENOSCOPY (EGD);  Surgeon: Theda Belfast, MD;  Location: [facility] ENDOSCOPY;  Service: Endoscopy;  Laterality: N/A;    Allergies:  Allergies  Allergen Reactions  . Poison Ivy Extract (Extract Of Poison Ivy) Rash    Current Medications:  Prior to Admission medications   Medication Sig Start Date End Date Taking? Authorizing  Provider  albuterol (PROVENTIL HFA;VENTOLIN HFA) 108 (90 BASE) MCG/ACT inhaler Inhale 2 puffs into the lungs every 6 (six) hours as needed. Shortness of breath    Historical Provider, MD  budesonide-formoterol (SYMBICORT) 80-4.5 MCG/ACT inhaler Inhale 2 puffs into the lungs 2 (two) times daily. 01/09/12 01/08/13  Calvert Cantor, MD  ferrous sulfate 325 (65 FE) MG tablet Take 325 mg by mouth daily with breakfast.    Historical Provider, MD  folic acid (FOLVITE) 1 MG tablet Take 1 mg by mouth daily.    Historical Provider, MD  HYDROcodone-acetaminophen (LORTAB) 7.5-500 MG per tablet Take 1 tablet by mouth every 6 (six) hours as needed. For pain    Historical Provider, MD  nicotine (NICODERM CQ) 7 mg/24hr patch Place 1 patch onto the skin daily. 01/09/12 02/08/12  Calvert Cantor, MD  pravastatin (PRAVACHOL) 40 MG tablet Take 40 mg by mouth daily.    Historical Provider, MD  thiamine (VITAMIN B-1) 100 MG tablet Take 100 mg by mouth daily.    Historical Provider, MD  traMADol (ULTRAM) 50 MG tablet Take 50 mg by mouth 2 (two) times daily as needed. For pain    Historical Provider, MD  vitamin B-12 (CYANOCOBALAMIN) 100 MCG tablet Take 100 mcg by mouth daily.    Historical Provider, MD  vitamin E (VITAMIN E) 200 UNIT capsule Take 200 Units by  mouth daily.    Historical Provider, MD    Mental Status Examination/Evaluation: Objective:  Appearance: Fairly Groomed looks much older than stated age   Psychomotor Activity:  Uses a cane -residual from stroke   Eye Contact: Good  Speech:  Clear and Coherent and Normal Rate  Volume:  Normal  Mood:euthymic     Affect:  Appropriate  Thought Process: clear rational goal oriented - get detoxed- but does love to drink beer uses his SS to buy it and sends others to buy it    Orientation:  Full  Thought Content:  No AVH/psychosis   Suicidal Thoughts:  No  Homicidal Thoughts:  No  Judgement:  Impaired  Insight:  Shallow    DIAGNOSIS:    AXIS I Alcohol Abuse  AXIS II  Deferred  AXIS III See medical history.  AXIS IV other psychosocial or environmental problems  AXIS V 51-60 moderate symptoms     Treatment Plan Summary:  Admit for alcohol detox using the [facility] protocol,

## 2012-02-01 NOTE — Progress Notes (Signed)
D: Pt denies SI/HI/AV. Pt is pleasant and cooperative. Pt rates depression at a 1 and Helplessness/hopelessness at a 1. Pt complains of no withdrawal symtoms. Pt did complain of some rgt side rib pain due to his fall at home. Pt is walking with a cane due to a fractured rgt foot that is in a boot.  A: Pt was offered support and encouragement. Pt was given scheduled and prn pain medications. Pt was encourage to attend groups. Q 15 minute checks were done for safety.  R:Pt attends groups and interacts well with peers and staff. Pt is taking medication. Pt has no complaints.Pt receptive to treatment and safety maintained on unit.

## 2012-02-01 NOTE — Progress Notes (Signed)
Patient ID: Peter [name], male   DOB: [birth year], 61 y.o.   MRN: [id number]  Counselor spoke with pt mother to discuss discharge and treatment issues. Mother stated that pt lives in his home with his brother who is attempting to assist with pt's care. Pt also receives assistance from nurse aid during the day. Mother stated that they have no concerns related to SI/Hi and that pt doesn't have any guns in the home. He has not received any mental health services only medical care. His medical doctor is the one who sent him to the hospital for detox purposes and to get into long team treatment. Pt has not been successful in staying sober more than two days at any given time. Pt's mother requested that long term treatment facilities be looked at for patient. He has been in long term treatment in the past and it was not successful. His mother will be coming to pick him up when he is discharged from the hospital she will just need to be contacted with the discharge information.   Signed  Hurley Cisco, MSW, LCSW Counselor

## 2012-02-01 NOTE — Progress Notes (Signed)
[facility] Adult [facility] Family/Significant Other Suicide Prevention Education  Suicide Prevention Education:  Education Completed; Peter [name], mother [phone] has been identified by the patient as the family member/significant other as the person(s) who will aid the patient in the event of a mental health crisis (suicidal ideations/suicide attempt).  With written consent from the patient, the family member/significant other has been provided the following suicide prevention education, prior to the and/or following the discharge of the patient.  The suicide prevention education provided includes the following:  Suicide risk factors  Suicide prevention and interventions  National Suicide Hotline telephone number  [facility] assessment telephone number  [facility] Emergency Assistance 911  [facility] and/or Residential Mobile Crisis Unit telephone number  Request made of family/significant other to:  Remove weapons (e.g., guns, rifles, knives), all items previously/currently identified as safety concern.    Remove drugs/medications (over-the-counter, prescriptions, illicit drugs), all items previously/currently identified as a safety concern.  The family member/significant other verbalizes understanding of the suicide prevention education information provided.  The family member/significant other agrees to remove the items of safety concern listed above.  Peter [name] 02/01/2012, 12:33 PM

## 2012-02-01 NOTE — Progress Notes (Signed)
Psychoeducational Group Note  Date:  02/01/2012 Time:  0930   Group Topic/Focus:  Spirituality:   The focus of this group is to discuss how one's spirituality can aide in recovery.  Participation Level:  Did Not Attend  Additional Comments:  Pt did not attend group.  Dalia Heading 02/01/2012, 11:11 AM

## 2012-02-02 DIAGNOSIS — F1020 Alcohol dependence, uncomplicated: Secondary | ICD-10-CM

## 2012-02-02 LAB — PATHOLOGIST SMEAR REVIEW

## 2012-02-02 NOTE — Progress Notes (Signed)
Currently resting quietly in bed with eyes closed. Respirations are even and unlabored. No acute distress or discomfort noted. Safety has been maintained with Q15 minute observation. Will continue current POC. 

## 2012-02-02 NOTE — Progress Notes (Signed)
Psychoeducational Group Note  Date:  02/02/2012 Time:  1000  Group Topic/Focus:  Goals Group:   The focus of this group is to help patients establish daily goals to achieve during treatment and discuss how the patient can incorporate goal setting into their daily lives to aide in recovery.  Participation Level:  Active  Participation Quality:  Appropriate  Affect:  Appropriate  Cognitive:  Appropriate  Insight:  Good  Engagement in Group:  Good  Additional Comments:  Pt participated and processed in group.  Marquis Lunch, Tariq Pernell 02/02/2012, 11:07 AM

## 2012-02-02 NOTE — Progress Notes (Signed)
Psychoeducational Group Note  Date:  02/02/2012 Time:  1100  Group Topic/Focus:  Self Care:   The focus of this group is to help patients understand the importance of self-care in order to improve or restore emotional, physical, spiritual, interpersonal, and financial health.  Participation Level:  Active  Participation Quality:  Attentive  Affect:  Appropriate  Cognitive:  Appropriate  Insight:  Good  Engagement in Group:  Good  Additional Comments:  Pt participated in group of self-care. Pt defined self care. Pt completed an assessment of areas of self care of physical, psychological, emotional, spiritual, and relationship care and rated how well they doing those areas. Pt also explained what areas were strong and what areas could use improvement. Pt also gave a specific area of what they would begin to do more of to improve self care. Karleen Hampshire Brittini 02/02/2012, 1:06 PM

## 2012-02-02 NOTE — Progress Notes (Signed)
[facility] Group Notes:  (Counselor/Nursing/MHT/Case Management/Adjunct)  02/02/2012 4:28 PM  Type of Therapy:  Group Therapy at 1:15 to 2:30  Participation Level:  Minimal  Participation Quality:  Sharing  Affect:  Flat  Cognitive:  Alert  Insight:  Limited  Engagement in Group:  Limited  Engagement in Therapy:  None  Modes of Intervention:  Clarification, Limit-setting, Orientation and Redirection  Summary of Progress/Problems:  Group discussion focused on what patient's see as their own obstacles to recovery. Peter [name] shared belief that sobriety will be difficult to deal with based on his past experiences.  "I been places before and I always drink again.  I had to drink a whole lot real fast to get in here because they won't let you in if you aren't intoxicated."  Patient's comments were frequent and not related to conversation as it developed.    Clide Dales 02/02/2012, 4:28 PM

## 2012-02-02 NOTE — Progress Notes (Signed)
[facility] MD Progress Note  02/02/2012 2:29 PM   S/O: Patient seen and evaluated. Chart reviewed. Patient stated that his mood was "good". His affect was mood congruent and euthymic. He denied any current thoughts of self injurious behavior, suicidal ideation or homicidal ideation. He denied any significant depressive signs or symptoms at this time. There were no auditory or visual hallucinations, paranoia, delusional thought processes, or mania noted.  Thought process was linear and goal directed.  No psychomotor agitation or retardation was noted. His speech was normal rate, tone and volume. Eye contact was good. Judgment and insight are fair.  Patient has been up and engaged on the unit.  No acute safety concerns reported from team.  Sleep:  Number of Hours: 3.25    Vital Signs:Blood pressure 98/60, pulse 102, temperature 98.4 F (36.9 C), temperature source Oral, resp. rate 18, height 5' 6.5" (1.689 m), weight 61.236 kg (135 lb).  Current Medications:    . acamprosate  666 mg Oral TID WC  . budesonide-formoterol  2 puff Inhalation BID  . chlordiazePOXIDE  25 mg Oral QID   Followed by  . chlordiazePOXIDE  25 mg Oral TID   Followed by  . chlordiazePOXIDE  25 mg Oral BH-qamhs   Followed by  . chlordiazePOXIDE  25 mg Oral Daily  . ferrous sulfate  325 mg Oral Q breakfast  . folic acid  1 mg Oral Daily  . multivitamin with minerals  1 tablet Oral Daily  . nicotine  21 mg Transdermal Q0600  . simvastatin  10 mg Oral q1800  . thiamine  100 mg Oral Daily  . vitamin B-12  100 mcg Oral Daily  . DISCONTD: nicotine  7 mg Transdermal Q0600    Lab Results: No results found for this or any previous visit (from the past 48 hour(s)).  Physical Findings: COWS:  COWS Total Score: 5   A/P: Alcohol Use & W/D Disorder; R foot Fracture; Colonic Dx  Continue meds as noted above and taper off alcohol so that pt can have operation on colon.  Pt refused medication for sleep and requested discharge  tomorrow.  ELOS 1-2 days.  Will reassess vitals and clinical status in am.  Pt will return home with nurse.  Lupe Carney 02/02/2012, 2:29 PM

## 2012-02-02 NOTE — Progress Notes (Signed)
Patient did attend this evenings speaker AA meeting. 

## 2012-02-02 NOTE — Discharge Planning (Signed)
"  Peter [name]" attended AM group, good participation.  States his PCP recommended he come in for detox so he can have needed surgery.  Says AA mtgs were successful many years ago in helping him with sobriety, and he plans to do the same again.  Hopes to d/c by Wed at latest. Per State Regulation 482.30 This chart was reviewed for medical necessity with respect to the patient's Admission/Duration of stay. Daryel Gerald, [place]  02/02/2012  Next Review Date:  02/05/2012

## 2012-02-02 NOTE — Progress Notes (Signed)
D: In room, using bathroom on approach. Appears flat and depressed, but is pleasant and open in conversation. Ambulating well with cane. No acute distress noted. States he has had a good day. Currently denies all WD symptoms and states he feels like he is ready for D/C. Intends to f/u with AA after D/C and he thinks the meetings will be helpful. Denies SI/HI/AVH and contracts for safety. Offered no questions or concerns.  A: Support and encouragement provided. Safety has been maintained with Q15 minute observation. POC and medications for the shift reviewed and understanding verbalized.   R: Pt remains safe. He is pleasant, cooperative and interacting appropriately with peers. He is complianmt with treatment goals and he is eager for D/C soon. He is ambulating well with cane. Will continue Q15 minute observation and continue current POC.

## 2012-02-03 ENCOUNTER — Other Ambulatory Visit (HOSPITAL_COMMUNITY): Payer: Self-pay

## 2012-02-03 LAB — COMPREHENSIVE METABOLIC PANEL
ALT: 21 U/L (ref 0–53)
AST: 29 U/L (ref 0–37)
Alkaline Phosphatase: 98 U/L (ref 39–117)
CO2: 24 mEq/L (ref 19–32)
Calcium: 9.7 mg/dL (ref 8.4–10.5)
Chloride: 101 mEq/L (ref 96–112)
GFR calc Af Amer: >90 mL/min (ref >90)
GFR calc non Af Amer: >90 mL/min (ref >90)
Glucose, Bld: 78 mg/dL (ref 70–99)
Potassium: 3.8 mEq/L (ref 3.5–5.1)
Sodium: 137 mEq/L (ref 135–145)

## 2012-02-03 LAB — CBC
Hemoglobin: 9 g/dL — ABNORMAL LOW (ref 13.0–17.0)
MCH: 26.4 pg (ref 26.0–34.0)
MCHC: 32.5 g/dL (ref 30.0–36.0)
RDW: 16.9 % — ABNORMAL HIGH (ref 11.5–15.5)

## 2012-02-03 NOTE — Progress Notes (Signed)
[facility] Group Notes:  (Counselor/Nursing/MHT/Case Management/Adjunct)  02/03/2012 4:05 PM  Type of Therapy:  Group Therapy at 1:15 to 2:30  Participation Level:  Active  Participation Quality:  Attentive  Affect:  Appropriate  Cognitive:   Appropriate  Insight:  Limited  Engagement in Group:  Good  Engagement in Therapy:  Limited  Modes of Intervention:  Education, Socialization and Support  Summary of Progress/Problems:  [name] attended group presentation by Interior and spatial designer of  [employer] ([employer]); he was attentive and appropriate during session.     Peter [name]

## 2012-02-03 NOTE — Progress Notes (Signed)
[facility] Group Notes:  (Counselor/Nursing/MHT/Case Management/Adjunct)  02/03/2012 3:08 PM  Type of Therapy:  Psychoeducational Skills  Participation Level:  Minimal  Participation Quality:  Appropriate, Attentive and Drowsy  Affect:  Appropriate and Blunted  Cognitive:  Appropriate and Oriented  Insight:  Limited  Engagement in Group:  Good  Engagement in Therapy:  n/a  Modes of Intervention:  Activity, Education, Problem-solving, Socialization and Support  Summary of Progress/Problems: Hobert came in late to Psychoeducational group on labels. Lavert watched a group activity labeling self and peers. Erbie was quiet but attentive while group discussed what labels are, how we use them, and listed positive and negative labels they have used or been called. Lliam was given a homework assignment to list 10 words he has been labeled to find the reality of the situation/label.     Wandra Scot 02/03/2012, 3:08 PM

## 2012-02-03 NOTE — Care Management (Signed)
Patient is connected with [facility] for medication management and SA assistance. Writer left message requesting a follow-up appointment. Patient also agreeable to AA meetings. Joice Lofts RN MS EdS 02/03/2012  3:24 PM

## 2012-02-03 NOTE — [facility] Suicide Risk Assessment (Signed)
Suicide Risk Assessment  Discharge Assessment      Demographic factors:  Male;Unemployed;Low socioeconomic status  Current Mental Status Per Nursing Assessment: On Admission:   (adamentally denies) At Discharge: Pt denied any SI/HI/thoughts of self harm or acute psychiatric issues in treatment team with clinical, nursing and medical team present.  Current Mental Status Per Physician: Patient seen and evaluated. Chart reviewed. Patient stated that his mood was "good". His affect was mood congruent and euthymic. He denied any current thoughts of self injurious behavior, suicidal ideation or homicidal ideation. He denied any significant depressive signs or symptoms at this time. There were no auditory or visual hallucinations, paranoia, delusional thought processes, or mania noted. Thought process was linear and goal directed. No psychomotor agitation or retardation was noted. His speech was normal rate, tone and volume. Eye contact was good. Judgment and insight are fair. Patient has been up and engaged on the unit. No acute safety concerns reported from team.   Loss Factors: Decline in physical health;Financial problems / change in socioeconomic status  Historical Factors: Family history of mental illness or substance abuse  Risk Reduction Factors: Sense of responsibility to family;Living with another person, especially a relative;Positive social support  Discharge Diagnoses: Alcohol Use Disorder; R foot Fracture; Colonic Dx; r/o Cognitive Disorder NOS, multifactorial   Past Medical History  Diagnosis Date  . Hypertension   . Hyperlipemia   . Emphysema   . Stroke   . Chronic pain   . Cirrhosis of liver   . Fall   . Dental injury   . Bone spur of other site     Left Foot  . Impaired mobility   . Total self care deficit   . Pneumonia     Cognitive Features That Contribute To Risk: limited insight; decreased executive functioning.  Suicide Risk: Pt viewed as a chronic moderate  increased risk of harm to self in light of his past hx and risk factors.  No acute safety concerns on the unit.  Pt contracting for safety and requesting discharge home with nursing support.  Plan Of Care/Follow-up recommendations: Pt seen and evaluated in treatment team. Chart reviewed.  Pt stable for and requesting discharge in am. Pt contracting for safety and does not currently meet [place] involuntary commitment criteria for continued hospitalization against his will.  Mental health treatment and continued sobriety will mitigate against the potential increased risk of harm to self and/or others.  Discussed the importance of recovery further with pt, as well as, tools to move forward in a healthy & safe manner.  Pt agreeable with the plan.  Discussed with the team.  Please see orders, follow up appointments per AVS and full discharge summary to be completed by physician extender.  Recommend follow up with AA.  Diet: Heart Healthy.  Activity: As tolerated.     Lupe Carney 02/03/2012, 4:33 PM

## 2012-02-03 NOTE — Tx Team (Signed)
Interdisciplinary Treatment Plan Update (Adult)  Date: 02/03/2012  Time Reviewed: 9:54 AM   Progress in Treatment: Attending groups: Yes Participating in groups:  Yes Taking medication as prescribed: Yes Tolerating medication:  Yes Family/Significant other contact made: Counselor will f/u with Home Health Nurse Patient understands diagnosis:  Yes Discussing patient identified problems/goals with staff:  Yes Medical problems stabilized or resolved:  Yes Denies suicidal/homicidal ideation: Yes Issues/concerns per patient self-inventory:  None identified Other: N/A  New problem(s) identified: None Identified  Reason for Continuation of Hospitalization: Withdrawal symptoms  Interventions implemented related to continuation of hospitalization: mood stabilization, medication monitoring and adjustment, group therapy and psycho education, safety checks q 15 mins  Additional comments: N/A  Estimated length of stay: 1 day  Discharge Plan: Return to home and follow-up with Outpatient treatment and AA meetings.  New goal(s): N/A  Review of initial/current patient goals per problem list:    1.  Goal(s): Address substance use  Met:  No  Target date: by discharge  As evidenced by: completing detox protocol and refer to appropriate treatment. Patient has not completed detox protocol.  2.  Goal (s): Reduce depressive symptoms  Met:  Yet  As evidenced by: Patient reports depression rated at 0/10. s    Attendees: Patient:  Peter [name] 02/03/2012 9:53 AM [place]   Family:     Physician:  Lupe Carney, DO 02/03/2012 9:53 AM  Nursing: Peggyann Shoals RN 02/03/2012 9:53 AM    Case Manager:  Barrie Folk, RN 02/03/2012 9:53 AM  Counselor:  Ronda Fairly, LCSWA 02/03/2012 9:53 AM  Other: [place]ers RN 02/03/2012 9:53AM   Other:     Other:     Other:      Scribe for Treatment Team:   Barrie Folk 02/03/2012 9:53 AM

## 2012-02-03 NOTE — Progress Notes (Signed)
Patient ID: Peter [name], male   DOB: January 10, 1951, 61 y.o.   MRN: [id number] He Has been up and to groups interacting with peers and staff. He denies thoughts of SI. Self inventory: Denies depression, hopelessness, SI, and pain.

## 2012-02-03 NOTE — Care Management (Signed)
Per State Regulation 482.30 This chart was reviewed for medical necessity with respect to the patient's Admission/Duration of stay. Attilio Zeitler L. Jaquise Faux RN MS EdS  Date: 02/03/2012  Next review Date: 02/06/12

## 2012-02-03 NOTE — Progress Notes (Signed)
[facility] Group Notes:  (Counselor/Nursing/MHT/Case Management/Adjunct)  02/03/2012 2:10 PM  Type of Therapy:  Psychoeducational Skills  Participation Level:  Active  Participation Quality:  Appropriate  Affect:  Appropriate  Cognitive:  Appropriate  Insight:  Good  Engagement in Group:  Good  Engagement in Therapy:  Good  Modes of Intervention:  Support  Summary of Progress/Problems: Pt attended the 1100 Group and openly shared what his recovery goals were and what his plan was to achieve them. Pt stated that he had experienced success attending AA meetings in the past and that he looked forward to having success attending them again.   Christ Kick 02/03/2012, 2:10 PM

## 2012-02-03 NOTE — Progress Notes (Signed)
02/03/2012      Time: 1500      Group Topic/Focus: The focus of this group is on discussing various styles of communication and communicating assertively using 'I' (feeling) statements.  Participation Level: Minimal  Participation Quality: Appropriate  Affect: Blunted  Cognitive: Alert   Additional Comments: Patient came in when group was half over, actively engaged with peers.   Peter [name] 02/03/2012 4:36 PM

## 2012-02-03 NOTE — Progress Notes (Signed)
Patient ID: Peter [name], male   DOB: September 24, 1950, 61 y.o.   MRN: [id number] He has been up and to group, interaction with peers and staff. He was  Unable to tell us about his discharge  follow-up plan that had been explained to him. His self inventory: no depression, hopelessness 0 , denies SI thoughts and no withdrawal symptoms reported .

## 2012-02-04 DIAGNOSIS — E611 Iron deficiency: Secondary | ICD-10-CM | POA: Diagnosis present

## 2012-02-04 MED ORDER — VITAMIN B-1 100 MG PO TABS: 100.0000 mg | ORAL_TABLET | Freq: Every day | ORAL | Status: DC

## 2012-02-04 MED ORDER — TRAZODONE HCL 50 MG PO TABS: 50.0000 mg | ORAL_TABLET | Freq: Every evening | ORAL | Status: DC | PRN

## 2012-02-04 MED ORDER — BUDESONIDE-FORMOTEROL FUMARATE 80-4.5 MCG/ACT IN AERO: 2.0000 | INHALATION_SPRAY | Freq: Two times a day (BID) | RESPIRATORY_TRACT | Status: DC

## 2012-02-04 MED ORDER — VITAMIN B-12 100 MCG PO TABS: 100.0000 ug | ORAL_TABLET | Freq: Every day | ORAL | Status: DC

## 2012-02-04 MED ORDER — FOLIC ACID 1 MG PO TABS: 1.0000 mg | ORAL_TABLET | Freq: Every day | ORAL | Status: DC

## 2012-02-04 MED ORDER — PRAVASTATIN SODIUM 40 MG PO TABS: 40.0000 mg | ORAL_TABLET | Freq: Every day | ORAL | Status: DC

## 2012-02-04 MED ORDER — FERROUS SULFATE 325 (65 FE) MG PO TABS: 325.0000 mg | ORAL_TABLET | Freq: Every day | ORAL | Status: DC

## 2012-02-04 MED ORDER — ACAMPROSATE CALCIUM 333 MG PO TBEC: 666.0000 mg | DELAYED_RELEASE_TABLET | Freq: Three times a day (TID) | ORAL | Status: AC

## 2012-02-04 NOTE — Tx Team (Signed)
Interdisciplinary Treatment Plan Update (Adult)  Date: 02/04/2012 Time Reviewed: 1000  Progress in Treatment: Attending groups: Yes Participating in groups:  Yes Taking medication as prescribed: Yes Tolerating medication:  Yes Family/Significant other contact made:  Yes with sister. Patient understands diagnosis:  Yes Discussing patient identified problems/goals with staff:  Yes Medical problems stabilized or resolved:  Yes Denies suicidal/homicidal ideation: Yes Issues/concerns per patient self-inventory:  None identified Other: N/A  New problem(s) identified: None Identified    Additional comments: Patient states that he is ready for discharge and that he is looking forward to starting CD-IOP. He is also agreeable to attending AA groups.  Estimated length of stay: Discharge Today  Discharge Plan: F/U with [facility] for CD-IOP program on 02/05/12. Patient well connected to PCP and agreed to follow-up. States that he has an appointment which is in his phone in his locker.  New goal(s): N/A  Review of initial/current patient goals per problem list:    1.  Goal(s): Address substance use  Met:  Yes  As evidenced by: completing detox protocol and refer to appropriate treatment  2.  Goal (s): Reduce depressive symptoms  Met:  Yes, Denies SI/HI and Psychosis. "I am more hopefull"     Attendees: Patient:  Peter [name] 02/04/2012 1000 AM   Family:     Physician:     Nursing:    Case Manager:  Barrie Folk, RN 02/04/2012 1000 AM  Counselor:  Ronda Fairly, LCSWA 02/04/2012 1000 AM  Other: Carolynn Comment RN 02/04/2012 1000 AM  Other:  Robbie Louis RN 02/04/2012 1000 AM   Other:  Leighton Parody RN 02/04/2012 1000 AM  Other:      Scribe for Treatment Team:   Barrie Folk RN MS EdS 02/04/2012 1000

## 2012-02-04 NOTE — Progress Notes (Signed)
D: Pt appears depressed and has a flat affect. He seems adamant that he is ready for discharge. Pt is cooperative and attends group regularly. Denies HI/SI. A: Support given. Verbalization encouraged. Pt encouraged to think about positive alternative coping skills. Safety maintained on unit. R: Pt is receptive. No complaints of pain or discomfort at this time. Q15 min observation continued.

## 2012-02-04 NOTE — Progress Notes (Signed)
D: Pt states he slept well, appetite good. Pt states energy level is normal.  A: Pt states he has "no depression and no hopelessness". Pt states he has rib pain and rates it at a 3. Offered pt pain medication. R: Pt states he does not need anything for pain. Pt denies SI/HI. Pt states he is to be D/Ced today and knows his f/u plan. Pt seems depressed, slow.

## 2012-02-05 NOTE — Progress Notes (Signed)
Patient Discharge Instructions:  After Visit Summary (AVS):   Faxed to:  02/05/2012 Psychiatric Admission Assessment Note:   Faxed to:  02/05/2012 Suicide Risk Assessment - Discharge Assessment:   Faxed to:  02/05/2012 Faxed/Sent to the Next Level Care provider:  02/05/2012  No fax available, sent via certified mail to [facility] @: [address] [place], [place] [address]  Wandra Scot, 02/05/2012, 5:44 PM

## 2012-02-22 NOTE — Discharge Summary (Signed)
Physician Discharge Summary Note  Patient:  Peter [name] is an 61 y.o., male MRN:  [id number] DOB:  Jun 25, 1951 Patient phone:  [phone] (home)  Patient address:   [address] [place] [address]  Date of Admission:  01/31/2012 Date of Discharge: 02/04/12  Reason for Admission: see H&P.  Principal Problem:  *Alcohol dependence Active Problems:  Abnormal liver enzymes  Hyponatremia  Iron deficiency  Discharge Diagnoses: Alcohol Use Disorder; R foot Fracture; Colonic Dx; r/o Cognitive Disorder NOS, multifactorial   Past Medical History  Diagnosis Date  . Hypertension   . Hyperlipemia   . Emphysema   . Stroke   . Chronic pain   . Cirrhosis of liver   . Fall   . Dental injury   . Bone spur of other site     Left Foot  . Impaired mobility   . Total self care deficit   . Pneumonia     Level of Care:  IOP  Hospital Course:  Pt admitted for crisis stabilization, detox and treatment. Pt attended all therapeutic groups, was active in his treatment planning process and agreed the current medication regimen for further stability during recovery.  There were no acute issues during treatment and he was open to further substance abuse Tx.  All labs were reviewed in great detail and medical/psychiatric needs were addressed.  No acute safety issues were noted on the unit. Medications were reviewed with pt and medication education was provided. Mental health treatment, medication management and continued sobriety will mitigate against any potential increased risk of harm to self and/or others.  Discussed the importance of recovery with pt, as well as, tools to move forward in a healthy & safe manner using the 12 Step Process.    Consults:  None.  Significant Diagnostic Studies:  See labs.  Discharge Vitals:   Blood pressure 110/78, pulse 116, temperature 98.1 F (36.7 C), temperature source Oral, resp. rate 12, height 5' 6.5" (1.689 m), weight 61.236 kg (135 lb).  Mental  Status Exam: See Mental Status Examination and Suicide Risk Assessment completed by Attending Physician prior to discharge.  Discharge destination:  Home  Is patient on multiple antipsychotic therapies at discharge:  No   Has Patient had three or more failed trials of antipsychotic monotherapy by history:  No  Recommended Plan for Multiple Antipsychotic Therapies: NA.  Discharge Orders    Future Orders Please Complete By Expires   Diet - low sodium heart healthy      Increase activity slowly      Discharge instructions      Comments:   Take all of your medications as prescribed.  Be sure to keep ALL follow up appointments as scheduled. This is to ensure getting your refills on time to avoid any interruption in your medication.  If you find that you can not keep your appointment, call the clinic and reschedule. Be sure to tell the nurse if you will need a refill before your appointment.   Follow-up      Scheduling Instructions:   You will need to follow up with your primary care physician for your chronic health problems of elevated cholesterol, iron deficiency, and COPD.  Please call as soon as you get home to schedule this appointment.  Please state that you need a "Hospital follow up appointment" when scheduling.  This will help to make sure you get any medication refills you need before your samples run out.     Medication List  As  of 02/22/2012  8:58 PM   STOP taking these medications         HYDROcodone-acetaminophen 7.5-500 MG per tablet      nicotine 7 mg/24hr patch      traMADol 50 MG tablet      vitamin E 200 UNIT capsule         TAKE these medications      Indication    albuterol 108 (90 BASE) MCG/ACT inhaler   Commonly known as: PROVENTIL HFA;VENTOLIN HFA   Inhale 2 puffs into the lungs every 6 (six) hours as needed. Shortness of breath       budesonide-formoterol 80-4.5 MCG/ACT inhaler   Commonly known as: SYMBICORT   Inhale 2 puffs into the lungs 2 (two) times  daily. For COPD.    Indication: Chronic Obstructive Lung Disease      ferrous sulfate 325 (65 FE) MG tablet   Take 1 tablet (325 mg total) by mouth daily with breakfast. For low iron.       folic acid 1 MG tablet   Commonly known as: FOLVITE   Take 1 tablet (1 mg total) by mouth daily. For low folate.    Indication: Deficiency of Folic Acid in the Diet      pravastatin 40 MG tablet   Commonly known as: PRAVACHOL   Take 1 tablet (40 mg total) by mouth daily. To lower your cholesterol.    Indication: Type II A Hyperlipidemia      thiamine 100 MG tablet   Commonly known as: VITAMIN B-1   Take 1 tablet (100 mg total) by mouth daily. For vitamin B deficiency.    Indication: Deficiency in Thiamine or Vitamin B1      traZODone 50 MG tablet   Commonly known as: DESYREL   Take 1 tablet (50 mg total) by mouth at bedtime as needed for sleep. For insomnia.    Indication: Trouble Sleeping      vitamin B-12 100 MCG tablet   Commonly known as: CYANOCOBALAMIN   Take 1 tablet (100 mcg total) by mouth daily. For low B12.            Follow-up Information    Follow up with AA Groups.   Contact information:   90 AA meetings in 90 days      Follow up with [facility] on 02/05/2012. (9 AM for CD-IOP group)    Contact information:   [address] [place], [place] [address] [phone]        Plan Of Care/Follow-up recommendations:  Recommend follow up with AA. Diet: Heart Healthy. Activity: As tolerated.   Signed: Lupe Carney 02/22/2012, 8:58 PM

## 2012-02-23 ENCOUNTER — Other Ambulatory Visit (HOSPITAL_COMMUNITY): Payer: Self-pay | Admitting: Oral Surgery

## 2012-03-24 ENCOUNTER — Encounter (HOSPITAL_COMMUNITY): Payer: Self-pay | Admitting: Pharmacy Technician

## 2012-03-25 ENCOUNTER — Encounter (HOSPITAL_COMMUNITY)
Admission: RE | Admit: 2012-03-25 | Discharge: 2012-03-25 | Disposition: A | Discharge status: Home / Self Care | Payer: Medicare Other | Source: Ambulatory Visit | Attending: Oral Surgery | Admitting: Oral Surgery

## 2012-03-25 ENCOUNTER — Encounter (HOSPITAL_COMMUNITY): Payer: Self-pay

## 2012-03-25 LAB — COMPREHENSIVE METABOLIC PANEL
AST: 36 U/L (ref 0–37)
Albumin: 4.2 g/dL (ref 3.5–5.2)
Alkaline Phosphatase: 82 U/L (ref 39–117)
BUN: 7 mg/dL (ref 6–23)
Creatinine, Ser: 0.76 mg/dL (ref 0.50–1.35)
GFR calc non Af Amer: >90 mL/min (ref >90)
Glucose, Bld: 90 mg/dL (ref 70–99)
Sodium: 141 mEq/L (ref 135–145)
Total Bilirubin: 0.2 mg/dL — ABNORMAL LOW (ref 0.3–1.2)

## 2012-03-25 LAB — CBC
MCH: 27.7 pg (ref 26.0–34.0)
MCV: 81.1 fL (ref 78.0–100.0)
Platelets: 266 10*3/uL (ref 150–400)
RBC: 4.7 MIL/uL (ref 4.22–5.81)
RDW: 16.2 % — ABNORMAL HIGH (ref 11.5–15.5)
WBC: 3.1 10*3/uL — ABNORMAL LOW (ref 4.0–10.5)

## 2012-03-25 NOTE — Pre-Procedure Instructions (Signed)
20 Peter [name]  03/25/2012   Your procedure is scheduled on:  Sept 23, 2013  Report to [facility] at 7:30 AM.  Call this number if you have problems the morning of surgery: [phone]   Remember:   Do not eat food:After Midnight.  Take these medicines the morning of surgery with A SIP OF WATER: inhalers, pain pill   Do not wear jewelry, make-up or nail polish.  Do not wear lotions, powders, or perfumes. You may wear deodorant.  Do not shave 48 hours prior to surgery. Men may shave face and neck.  Do not bring valuables to the hospital.  Contacts, dentures or bridgework may not be worn into surgery.  Leave suitcase in the car. After surgery it may be brought to your room.  For patients admitted to the hospital, checkout time is 11:00 AM the day of discharge.   Patients discharged the day of surgery will not be allowed to drive home.  Name and phone number of your driver:   Special Instructions: CHG Shower Shower 2 days before surgery and 1 day before surgery with Hibiclens.   Please read over the following fact sheets that you were given: Pain Booklet, Coughing and Deep Breathing and Surgical Site Infection Prevention

## 2012-03-29 ENCOUNTER — Encounter (HOSPITAL_COMMUNITY): Payer: Self-pay | Admitting: Anesthesiology

## 2012-03-29 ENCOUNTER — Encounter (HOSPITAL_COMMUNITY): Payer: Self-pay | Admitting: *Deleted

## 2012-03-29 ENCOUNTER — Ambulatory Visit (HOSPITAL_COMMUNITY): Payer: Medicare Other | Admitting: Anesthesiology

## 2012-03-29 ENCOUNTER — Ambulatory Visit (HOSPITAL_COMMUNITY)
Admission: RE | Admit: 2012-03-29 | Discharge: 2012-03-29 | Disposition: A | Discharge status: Home / Self Care | Payer: Medicare Other | Source: Ambulatory Visit | Attending: Oral Surgery | Admitting: Oral Surgery

## 2012-03-29 ENCOUNTER — Encounter (HOSPITAL_COMMUNITY)
Admission: RE | Disposition: A | Discharge status: Home / Self Care | Payer: Self-pay | Source: Ambulatory Visit | Attending: Oral Surgery

## 2012-03-29 DIAGNOSIS — R748 Abnormal levels of other serum enzymes: Secondary | ICD-10-CM

## 2012-03-29 DIAGNOSIS — Z01812 Encounter for preprocedural laboratory examination: Secondary | ICD-10-CM | POA: Insufficient documentation

## 2012-03-29 DIAGNOSIS — M2607 Excessive tuberosity of jaw: Secondary | ICD-10-CM | POA: Insufficient documentation

## 2012-03-29 DIAGNOSIS — J189 Pneumonia, unspecified organism: Secondary | ICD-10-CM

## 2012-03-29 DIAGNOSIS — E871 Hypo-osmolality and hyponatremia: Secondary | ICD-10-CM

## 2012-03-29 DIAGNOSIS — E876 Hypokalemia: Secondary | ICD-10-CM

## 2012-03-29 DIAGNOSIS — E785 Hyperlipidemia, unspecified: Secondary | ICD-10-CM

## 2012-03-29 DIAGNOSIS — D72829 Elevated white blood cell count, unspecified: Secondary | ICD-10-CM

## 2012-03-29 DIAGNOSIS — S2249XA Multiple fractures of ribs, unspecified side, initial encounter for closed fracture: Secondary | ICD-10-CM

## 2012-03-29 DIAGNOSIS — I10 Essential (primary) hypertension: Secondary | ICD-10-CM | POA: Insufficient documentation

## 2012-03-29 DIAGNOSIS — J690 Pneumonitis due to inhalation of food and vomit: Secondary | ICD-10-CM

## 2012-03-29 DIAGNOSIS — F1020 Alcohol dependence, uncomplicated: Secondary | ICD-10-CM

## 2012-03-29 DIAGNOSIS — K056 Periodontal disease, unspecified: Secondary | ICD-10-CM | POA: Insufficient documentation

## 2012-03-29 DIAGNOSIS — K069 Disorder of gingiva and edentulous alveolar ridge, unspecified: Secondary | ICD-10-CM | POA: Insufficient documentation

## 2012-03-29 DIAGNOSIS — D649 Anemia, unspecified: Secondary | ICD-10-CM

## 2012-03-29 DIAGNOSIS — E611 Iron deficiency: Secondary | ICD-10-CM

## 2012-03-29 DIAGNOSIS — R55 Syncope and collapse: Secondary | ICD-10-CM

## 2012-03-29 DIAGNOSIS — J449 Chronic obstructive pulmonary disease, unspecified: Secondary | ICD-10-CM | POA: Insufficient documentation

## 2012-03-29 DIAGNOSIS — Z01818 Encounter for other preprocedural examination: Secondary | ICD-10-CM | POA: Insufficient documentation

## 2012-03-29 DIAGNOSIS — F10239 Alcohol dependence with withdrawal, unspecified: Secondary | ICD-10-CM

## 2012-03-29 DIAGNOSIS — J4489 Other specified chronic obstructive pulmonary disease: Secondary | ICD-10-CM | POA: Insufficient documentation

## 2012-03-29 DIAGNOSIS — K029 Dental caries, unspecified: Secondary | ICD-10-CM | POA: Insufficient documentation

## 2012-03-29 HISTORY — PX: MULTIPLE EXTRACTIONS WITH ALVEOLOPLASTY: SHX5342

## 2012-03-29 SURGERY — MULTIPLE EXTRACTION WITH ALVEOLOPLASTY
Anesthesia: General | Site: Mouth | Laterality: Bilateral | Wound class: Clean Contaminated

## 2012-03-29 MED ORDER — FENTANYL CITRATE 0.05 MG/ML IJ SOLN
INTRAMUSCULAR | Status: DC | PRN
  Administered 2012-03-29 (×2): 50 ug via INTRAVENOUS
  Administered 2012-03-29: 100 ug via INTRAVENOUS

## 2012-03-29 MED ORDER — LABETALOL HCL 5 MG/ML IV SOLN
INTRAVENOUS | Status: DC | PRN
  Administered 2012-03-29: 5 mg via INTRAVENOUS

## 2012-03-29 MED ORDER — ONDANSETRON HCL 4 MG/2ML IJ SOLN
INTRAMUSCULAR | Status: DC | PRN
  Administered 2012-03-29: 4 mg via INTRAVENOUS

## 2012-03-29 MED ORDER — HYDROMORPHONE HCL PF 1 MG/ML IJ SOLN: 0.2500 mg | INTRAMUSCULAR | Status: DC | PRN

## 2012-03-29 MED ORDER — OXYCODONE HCL 5 MG/5ML PO SOLN: 5.0000 mg | Freq: Once | ORAL | Status: DC | PRN

## 2012-03-29 MED ORDER — ROCURONIUM BROMIDE 100 MG/10ML IV SOLN
INTRAVENOUS | Status: DC | PRN
  Administered 2012-03-29: 40 mg via INTRAVENOUS

## 2012-03-29 MED ORDER — SODIUM CHLORIDE 0.9 % IR SOLN
Status: DC | PRN
  Administered 2012-03-29: 1000 mL

## 2012-03-29 MED ORDER — LACTATED RINGERS IV SOLN
INTRAVENOUS | Status: DC
  Administered 2012-03-29: 08:00:00 via INTRAVENOUS

## 2012-03-29 MED ORDER — PROPOFOL 10 MG/ML IV BOLUS
INTRAVENOUS | Status: DC | PRN
  Administered 2012-03-29: 200 mg via INTRAVENOUS

## 2012-03-29 MED ORDER — GLYCOPYRROLATE 0.2 MG/ML IJ SOLN
INTRAMUSCULAR | Status: DC | PRN
  Administered 2012-03-29: .4 mg via INTRAVENOUS

## 2012-03-29 MED ORDER — CEFAZOLIN SODIUM-DEXTROSE 2-3 GM-% IV SOLR
INTRAVENOUS | Status: DC | PRN
  Administered 2012-03-29: 2 g via INTRAVENOUS

## 2012-03-29 MED ORDER — LIDOCAINE-EPINEPHRINE 2 %-1:100000 IJ SOLN
INTRAMUSCULAR | Status: AC
  Filled 2012-03-29: qty 1

## 2012-03-29 MED ORDER — MIDAZOLAM HCL 5 MG/5ML IJ SOLN
INTRAMUSCULAR | Status: DC | PRN
  Administered 2012-03-29: 2 mg via INTRAVENOUS

## 2012-03-29 MED ORDER — OXYCODONE HCL 5 MG PO TABS: 5.0000 mg | ORAL_TABLET | Freq: Once | ORAL | Status: DC | PRN

## 2012-03-29 MED ORDER — LACTATED RINGERS IV SOLN
INTRAVENOUS | Status: DC | PRN
  Administered 2012-03-29 (×2): via INTRAVENOUS

## 2012-03-29 MED ORDER — ONDANSETRON HCL 4 MG/2ML IJ SOLN: 4.0000 mg | Freq: Once | INTRAMUSCULAR | Status: DC | PRN

## 2012-03-29 MED ORDER — LIDOCAINE HCL (CARDIAC) 20 MG/ML IV SOLN
INTRAVENOUS | Status: DC | PRN
  Administered 2012-03-29: 30 mg via INTRAVENOUS

## 2012-03-29 MED ORDER — CEFAZOLIN SODIUM-DEXTROSE 2-3 GM-% IV SOLR
INTRAVENOUS | Status: AC
  Filled 2012-03-29: qty 50

## 2012-03-29 MED ORDER — OXYCODONE-ACETAMINOPHEN 5-325 MG PO TABS: 2.0000 | ORAL_TABLET | ORAL | Status: DC | PRN

## 2012-03-29 MED ORDER — LIDOCAINE-EPINEPHRINE 2 %-1:100000 IJ SOLN
INTRAMUSCULAR | Status: DC | PRN
  Administered 2012-03-29: 13 mL

## 2012-03-29 MED ORDER — 0.9 % SODIUM CHLORIDE (POUR BTL) OPTIME
TOPICAL | Status: DC | PRN
  Administered 2012-03-29: 1000 mL

## 2012-03-29 MED ORDER — NEOSTIGMINE METHYLSULFATE 1 MG/ML IJ SOLN
INTRAMUSCULAR | Status: DC | PRN
  Administered 2012-03-29: 3 mg via INTRAVENOUS

## 2012-03-29 SURGICAL SUPPLY — 27 items
BUR CROSS CUT FISSURE 1.6 (BURR) ×2 IMPLANT
BUR EGG ELITE 4.0 (BURR) ×2 IMPLANT
CANISTER SUCTION 2500CC (MISCELLANEOUS) ×2 IMPLANT
CLOTH BEACON ORANGE TIMEOUT ST (SAFETY) ×2 IMPLANT
COVER SURGICAL LIGHT HANDLE (MISCELLANEOUS) ×2 IMPLANT
CRADLE DONUT ADULT HEAD (MISCELLANEOUS) ×2 IMPLANT
DECANTER SPIKE VIAL GLASS SM (MISCELLANEOUS) ×2 IMPLANT
GAUZE PACKING FOLDED 2  STR (GAUZE/BANDAGES/DRESSINGS) ×1
GAUZE PACKING FOLDED 2 STR (GAUZE/BANDAGES/DRESSINGS) ×1 IMPLANT
GLOVE BIO SURGEON STRL SZ 6.5 (GLOVE) ×2 IMPLANT
GLOVE BIO SURGEON STRL SZ7.5 (GLOVE) ×2 IMPLANT
GLOVE BIOGEL PI IND STRL 7.0 (GLOVE) ×1 IMPLANT
GLOVE BIOGEL PI INDICATOR 7.0 (GLOVE) ×1
GOWN STRL NON-REIN LRG LVL3 (GOWN DISPOSABLE) ×3 IMPLANT
GOWN STRL REIN XL XLG (GOWN DISPOSABLE) ×2 IMPLANT
KIT BASIN OR (CUSTOM PROCEDURE TRAY) ×2 IMPLANT
KIT ROOM TURNOVER OR (KITS) ×2 IMPLANT
MARKER SKIN DUAL TIP RULER LAB (MISCELLANEOUS) ×1 IMPLANT
NEEDLE 22X1 1/2 (OR ONLY) (NEEDLE) ×2 IMPLANT
NS IRRIG 1000ML POUR BTL (IV SOLUTION) ×2 IMPLANT
PAD ARMBOARD 7.5X6 YLW CONV (MISCELLANEOUS) ×4 IMPLANT
SUT CHROMIC 3 0 PS 2 (SUTURE) ×2 IMPLANT
SYR CONTROL 10ML LL (SYRINGE) ×2 IMPLANT
TOWEL OR 17X26 10 PK STRL BLUE (TOWEL DISPOSABLE) ×2 IMPLANT
TRAY ENT MC OR (CUSTOM PROCEDURE TRAY) ×2 IMPLANT
TUBING IRRIGATION (MISCELLANEOUS) ×1 IMPLANT
YANKAUER SUCT BULB TIP NO VENT (SUCTIONS) ×2 IMPLANT

## 2012-03-29 NOTE — Preoperative (Signed)
Beta Blockers   Reason not to administer Beta Blockers:Not Applicable 

## 2012-03-29 NOTE — Op Note (Signed)
03/29/2012  10:46 AM  PATIENT:  Peter [name]  61 y.o. male  PRE-OPERATIVE DIAGNOSIS:  NONRESTORABLE TEETH #'s 3, 12, 14, 21, 22  POST-OPERATIVE DIAGNOSIS:  SAME + Hyperplastic Right Maxillary fibrous tuberosity  PROCEDURE:  Procedure(s): Extraction teeth #'s 3, 12, 14, 21, 22 WITH ALVEOLOPLASTY Right and left maxilla, left mandible, removal fibrous tuberosity right maxilla  SURGEON:  Surgeon(s): Georgia Lopes, DDS  ANESTHESIA:   local and general  EBL:  minimal  DRAINS: none   SPECIMEN:  No Specimen  COUNTS:  YES  PLAN OF CARE: Discharge to home after PACU  PATIENT DISPOSITION:  PACU - hemodynamically stable.   PROCEDURE DETAILS: Dictation # [id number]  Georgia Lopes, DMD 03/29/2012 10:46 AM

## 2012-03-29 NOTE — H&P (Signed)
HISTORY AND PHYSICAL  Peter [name] is a 61 y.o. male patient with CC:  No diagnosis found.  Past Medical History  Diagnosis Date  . Hypertension   . Hyperlipemia   . Emphysema   . Stroke   . Chronic pain   . Cirrhosis of liver   . Fall   . Dental injury   . Bone spur of other site     Left Foot  . Impaired mobility   . Total self care deficit   . Pneumonia     Current Facility-Administered Medications  Medication Dose Route Frequency Provider Last Rate Last Dose  . lactated ringers infusion   Intravenous Continuous Georgia Lopes, DDS 50 mL/hr at 03/29/12 8295     Allergies  Allergen Reactions  . Poison Ivy Extract (Extract Of Poison Ivy) Rash   Active Problems:  * No active hospital problems. *   Vitals: Blood pressure 161/89, pulse 103, temperature 99.4 F (37.4 C), temperature source Oral, resp. rate 18, SpO2 100.00%. Lab results:No results found for this or any previous visit (from the past 24 hour(s)). Radiology Results: No results found. General appearance: alert and cooperative Head: Normocephalic, without obvious abnormality, atraumatic Eyes: negative Ears: normal TM's and external ear canals both ears Nose: Nares normal. Septum midline. Mucosa normal. No drainage or sinus tenderness. Throat: Dental Caries teeth #'s 3, 12, 14, 21, 22, Irregular alveolar contour maxilla and mandible Neck: no adenopathy and supple, symmetrical, trachea midline Resp: clear to auscultation bilaterally Cardio: regular rate and rhythm, S1, S2 normal, no murmur, click, rub or gallop GI: soft, non-tender; bowel sounds normal; no masses,  no organomegaly  Assessment:61 yo male HTN, Emphysema, Stroke,Cirrhosis,  with non-restorable teeth #'s 3, 12, 14, 21, 22, irregular alveolar contour maxilla and mandible.  Plan: Extract teeth #'s 3, 12, 14, 21, 22, alveoloplasty maxilla and mandible. General anesthesia, day surgery.   Georgia Lopes 03/29/2012

## 2012-03-29 NOTE — Transfer of Care (Signed)
Immediate Anesthesia Transfer of Care Note  Patient: Peter [name]  Procedure(s) Performed: Procedure(s) (LRB) with comments: MULTIPLE EXTRACION WITH ALVEOLOPLASTY (Bilateral)  Patient Location: PACU  Anesthesia Type: General  Level of Consciousness: awake, alert  and oriented  Airway & Oxygen Therapy: Patient Spontanous Breathing and Patient connected to face mask oxygen  Post-op Assessment: Report given to PACU RN  Post vital signs: Reviewed and stable  Complications: No apparent anesthesia complications

## 2012-03-29 NOTE — Anesthesia Preprocedure Evaluation (Addendum)
Anesthesia Evaluation  Patient identified by MRN, date of birth, ID band Patient awake    Reviewed: Allergy & Precautions, H&P , NPO status , Patient's Chart, lab work & pertinent test results  Airway Mallampati: I TM Distance: >3 FB Neck ROM: Full    Dental  (+) Poor Dentition and Dental Advisory Given   Pulmonary  breath sounds clear to auscultation        Cardiovascular Pt. on medications Rhythm:Regular Rate:Normal     Neuro/Psych    GI/Hepatic   Endo/Other    Renal/GU      Musculoskeletal   Abdominal   Peds  Hematology   Anesthesia Other Findings   Reproductive/Obstetrics                          Anesthesia Physical Anesthesia Plan  ASA: III  Anesthesia Plan:    Post-op Pain Management:    Induction: Intravenous  Airway Management Planned: Nasal ETT  Additional Equipment:   Intra-op Plan:   Post-operative Plan: Extubation in OR  Informed Consent: I have reviewed the patients History and Physical, chart, labs and discussed the procedure including the risks, benefits and alternatives for the proposed anesthesia with the patient or authorized representative who has indicated his/her understanding and acceptance.   Dental advisory given  Plan Discussed with: CRNA, Anesthesiologist and Surgeon  Anesthesia Plan Comments:         Anesthesia Quick Evaluation

## 2012-03-29 NOTE — Anesthesia Postprocedure Evaluation (Signed)
  Anesthesia Post-op Note  Patient: Peter [name]  Procedure(s) Performed: Procedure(s) (LRB) with comments: MULTIPLE EXTRACION WITH ALVEOLOPLASTY (Bilateral)  Patient Location: PACU  Anesthesia Type: General  Level of Consciousness: awake, alert  and oriented  Airway and Oxygen Therapy: Patient Spontanous Breathing and Patient connected to nasal cannula oxygen  Post-op Pain: mild  Post-op Assessment: Post-op Vital signs reviewed  Post-op Vital Signs: Reviewed  Complications: No apparent anesthesia complications

## 2012-03-30 ENCOUNTER — Encounter (HOSPITAL_COMMUNITY): Payer: Self-pay | Admitting: Oral Surgery

## 2012-03-30 NOTE — Op Note (Signed)
NAME:  Peter [name], Peter [name] NO.:  [id number]  MEDICAL RECORD NO.:  [id number]  LOCATION:  [facility]                         FACILITY:  [facility]  PHYSICIAN:  Georgia Lopes, M.D.  DATE OF BIRTH:  01/03/1951  DATE OF PROCEDURE:  03/29/2012 DATE OF DISCHARGE:  03/29/2012                              OPERATIVE REPORT   PREOPERATIVE DIAGNOSIS:  Nonrestorable teeth numbers 3, 12, 14, 21, 22.  POSTOPERATIVE DIAGNOSES:  Nonrestorable teeth numbers 3, 12, 14, 21, 22. Hyperplastic right maxillary fibrous tuberosity.  PROCEDURE:  Removal of teeth numbers 3, 12, 14, 21, 22; alveoplasty, right and left maxilla; alveoplasty, left mandible; removal of fibers tuberosity, right maxilla.  SURGEON:  Georgia Lopes, M.D.  ANESTHESIA:  General, Dr. Ivin Booty attending.  INDICATION FOR PROCEDURE:  [name] is a 61 year old male with multiple significant medical problems including hypertension, stroke, cirrhosis, COPD who presented for removal of all remaining teeth secondary to severe dental caries and periodontal disease because of the patient's past medical history was recommended that the patient be anesthetized with intubation for patient care.  PROCEDURE:  The patient was taken to the operating room, placed on the table in supine position.  General anesthesia was administered intravenously and the nasal endotracheal tube was placed and secured. The eyes were protected.  The patient was draped for the procedure. Time-out was performed.  The posterior pharynx was suctioned.  A throat pack was placed.  A 2% lidocaine with 1:100,000 epinephrine was infiltrated in an inferior alveolar block on the left side and infiltration was given to the right and left maxilla around the teeth to be removed.  A bite block was placed on the right side of the mouth and a sweetheart retractor was used to retract the tongue.  A 15-blade was used to make a full-thickness incision approximately 2 cm proximal  and distal to teeth numbers 21 and 22, on the buccal and lingual aspects of these teeth and then an incision was made around teeth numbers 12 and 14, and the incision was carried proximally and distally approximately 1 cm.  The periosteum was reflected with a periosteal elevator.  Bone was removed with the Stryker handpiece.  The teeth were elevated and removed with the dental forceps.  The sockets were then curetted and then the periosteum was further reflected and an alveoplasty was performed in the left maxilla and mandible using the egg-shaped bur and bone file.  Then, the areas were irrigated and closed with 3-0 chromic.  The bite block was repositioned to the other side of the mouth and a 15-blade was used to make a full-thickness incision around tooth number 3.  The periosteum was reflected and the tooth was removed with the upper forceps socket was curetted.  The periosteum was further reflected along the maxillary ridge after the canine area and then alveoplasty was performed with the egg-shaped bur and the bone file.  Then, additional local anesthesia was administered in the tuberosity region buckling and palatally.  Because the tuberosity was large and bulbous, and it was determined that a denture could not be fabricated unless the reduction was performed.  A 15-blade was then used to make  a wedge-shaped incision, which was down to the bone in the tuberosity region and then the rongeur was used to remove this excessive hyperplastic fibrous tissue.  Then, the area was approximated with 3-0 chromic and the remainder of the incision was closed with 3-0 chromic.  The oral cavity was then irrigated and suctioned and the oral cavity was inspected, found to have good contour and hemostasis.  The posterior pharynx was suctioned.  Throat pack was removed.  The patient was awakened, taken to the recovery room, breathing spontaneously in good condition.  ESTIMATED BLOOD LOSS:   Minimum.  COMPLICATIONS:  None.  SPECIMENS:  None.     Georgia Lopes, M.D.     SMJ/MEDQ  D:  03/29/2012  T:  03/30/2012  Job:  [id number]

## 2012-05-17 ENCOUNTER — Encounter (HOSPITAL_COMMUNITY): Payer: Self-pay | Admitting: Emergency Medicine

## 2012-05-17 ENCOUNTER — Emergency Department (HOSPITAL_COMMUNITY)
Admission: EM | Admit: 2012-05-17 | Discharge: 2012-05-18 | Disposition: A | Discharge status: Home / Self Care | Payer: Medicaid Other | Attending: Emergency Medicine | Admitting: Emergency Medicine

## 2012-05-17 DIAGNOSIS — Z8709 Personal history of other diseases of the respiratory system: Secondary | ICD-10-CM | POA: Insufficient documentation

## 2012-05-17 DIAGNOSIS — Z79899 Other long term (current) drug therapy: Secondary | ICD-10-CM | POA: Insufficient documentation

## 2012-05-17 DIAGNOSIS — Z9181 History of falling: Secondary | ICD-10-CM | POA: Insufficient documentation

## 2012-05-17 DIAGNOSIS — Z8701 Personal history of pneumonia (recurrent): Secondary | ICD-10-CM | POA: Insufficient documentation

## 2012-05-17 DIAGNOSIS — Z8739 Personal history of other diseases of the musculoskeletal system and connective tissue: Secondary | ICD-10-CM | POA: Insufficient documentation

## 2012-05-17 DIAGNOSIS — K7460 Unspecified cirrhosis of liver: Secondary | ICD-10-CM | POA: Insufficient documentation

## 2012-05-17 DIAGNOSIS — F1010 Alcohol abuse, uncomplicated: Secondary | ICD-10-CM | POA: Insufficient documentation

## 2012-05-17 DIAGNOSIS — F17200 Nicotine dependence, unspecified, uncomplicated: Secondary | ICD-10-CM | POA: Insufficient documentation

## 2012-05-17 DIAGNOSIS — Z87828 Personal history of other (healed) physical injury and trauma: Secondary | ICD-10-CM | POA: Insufficient documentation

## 2012-05-17 DIAGNOSIS — E785 Hyperlipidemia, unspecified: Secondary | ICD-10-CM | POA: Insufficient documentation

## 2012-05-17 DIAGNOSIS — I10 Essential (primary) hypertension: Secondary | ICD-10-CM | POA: Insufficient documentation

## 2012-05-17 DIAGNOSIS — G8929 Other chronic pain: Secondary | ICD-10-CM | POA: Insufficient documentation

## 2012-05-17 DIAGNOSIS — I6350 Cerebral infarction due to unspecified occlusion or stenosis of unspecified cerebral artery: Secondary | ICD-10-CM | POA: Insufficient documentation

## 2012-05-17 HISTORY — DX: Abnormal results of liver function studies: R94.5

## 2012-05-17 HISTORY — DX: Alcohol abuse, uncomplicated: F10.10

## 2012-05-17 LAB — CBC
MCHC: 34.3 g/dL (ref 30.0–36.0)
MCV: 77.3 fL — ABNORMAL LOW (ref 78.0–100.0)
Platelets: 222 10*3/uL (ref 150–400)
RDW: 17 % — ABNORMAL HIGH (ref 11.5–15.5)
WBC: 4.6 10*3/uL (ref 4.0–10.5)

## 2012-05-17 LAB — COMPREHENSIVE METABOLIC PANEL
AST: 106 U/L — ABNORMAL HIGH (ref 0–37)
Albumin: 3.8 g/dL (ref 3.5–5.2)
BUN: 7 mg/dL (ref 6–23)
Chloride: 97 mEq/L (ref 96–112)
Creatinine, Ser: 0.76 mg/dL (ref 0.50–1.35)
Total Bilirubin: 0.2 mg/dL — ABNORMAL LOW (ref 0.3–1.2)
Total Protein: 8.9 g/dL — ABNORMAL HIGH (ref 6.0–8.3)

## 2012-05-17 LAB — ETHANOL
Alcohol, Ethyl (B): 436 mg/dL (ref 0–11)
Alcohol, Ethyl (B): 83 mg/dL — ABNORMAL HIGH (ref 0–11)

## 2012-05-17 LAB — RAPID URINE DRUG SCREEN, HOSP PERFORMED
Amphetamines: NOT DETECTED
Benzodiazepines: NOT DETECTED
Cocaine: NOT DETECTED
Opiates: POSITIVE — AB
Tetrahydrocannabinol: NOT DETECTED

## 2012-05-17 MED ORDER — ZOLPIDEM TARTRATE 5 MG PO TABS: 5.0000 mg | ORAL_TABLET | Freq: Every evening | ORAL | Status: DC | PRN

## 2012-05-17 MED ORDER — ACAMPROSATE CALCIUM 333 MG PO TBEC
666.0000 mg | DELAYED_RELEASE_TABLET | Freq: Three times a day (TID) | ORAL | Status: DC
  Administered 2012-05-17 – 2012-05-18 (×2): 666 mg via ORAL
  Filled 2012-05-17 (×6): qty 2

## 2012-05-17 MED ORDER — ACETAMINOPHEN 325 MG PO TABS
650.0000 mg | ORAL_TABLET | ORAL | Status: DC | PRN
  Administered 2012-05-18: 650 mg via ORAL
  Filled 2012-05-17: qty 2

## 2012-05-17 MED ORDER — FOLIC ACID 1 MG PO TABS
1.0000 mg | ORAL_TABLET | Freq: Every day | ORAL | Status: DC
  Administered 2012-05-17 – 2012-05-18 (×2): 1 mg via ORAL
  Filled 2012-05-17 (×2): qty 1

## 2012-05-17 MED ORDER — THIAMINE HCL 100 MG/ML IJ SOLN: 100.0000 mg | Freq: Every day | INTRAMUSCULAR | Status: DC

## 2012-05-17 MED ORDER — ADULT MULTIVITAMIN W/MINERALS CH
1.0000 | ORAL_TABLET | Freq: Every day | ORAL | Status: DC
  Administered 2012-05-17 – 2012-05-18 (×2): 1 via ORAL
  Filled 2012-05-17 (×2): qty 1

## 2012-05-17 MED ORDER — VITAMIN B-1 100 MG PO TABS
100.0000 mg | ORAL_TABLET | Freq: Every day | ORAL | Status: DC
  Administered 2012-05-17 – 2012-05-18 (×2): 100 mg via ORAL
  Filled 2012-05-17 (×2): qty 1

## 2012-05-17 MED ORDER — IBUPROFEN 200 MG PO TABS
600.0000 mg | ORAL_TABLET | Freq: Three times a day (TID) | ORAL | Status: DC | PRN
  Administered 2012-05-18: 600 mg via ORAL
  Filled 2012-05-17: qty 3

## 2012-05-17 MED ORDER — BUDESONIDE-FORMOTEROL FUMARATE 80-4.5 MCG/ACT IN AERO
2.0000 | INHALATION_SPRAY | Freq: Two times a day (BID) | RESPIRATORY_TRACT | Status: DC
  Administered 2012-05-18 (×2): 2 via RESPIRATORY_TRACT
  Filled 2012-05-17: qty 6.9

## 2012-05-17 MED ORDER — ALBUTEROL SULFATE HFA 108 (90 BASE) MCG/ACT IN AERS: 2.0000 | INHALATION_SPRAY | Freq: Four times a day (QID) | RESPIRATORY_TRACT | Status: DC | PRN

## 2012-05-17 MED ORDER — LORAZEPAM 1 MG PO TABS
1.0000 mg | ORAL_TABLET | Freq: Three times a day (TID) | ORAL | Status: DC | PRN
  Administered 2012-05-17 – 2012-05-18 (×2): 1 mg via ORAL
  Filled 2012-05-17 (×2): qty 1

## 2012-05-17 MED ORDER — LORAZEPAM 2 MG/ML IJ SOLN
1.0000 mg | Freq: Four times a day (QID) | INTRAMUSCULAR | Status: DC | PRN
  Administered 2012-05-18: 1 mg via INTRAVENOUS
  Filled 2012-05-17: qty 1

## 2012-05-17 MED ORDER — HYDROCODONE-ACETAMINOPHEN 5-325 MG PO TABS: 1.0000 | ORAL_TABLET | Freq: Four times a day (QID) | ORAL | Status: DC | PRN

## 2012-05-17 MED ORDER — ALUM & MAG HYDROXIDE-SIMETH 200-200-20 MG/5ML PO SUSP: 30.0000 mL | ORAL | Status: DC | PRN

## 2012-05-17 MED ORDER — SIMVASTATIN 20 MG PO TABS
20.0000 mg | ORAL_TABLET | Freq: Every day | ORAL | Status: DC
  Filled 2012-05-17: qty 1

## 2012-05-17 MED ORDER — LORAZEPAM 1 MG PO TABS: 1.0000 mg | ORAL_TABLET | Freq: Four times a day (QID) | ORAL | Status: DC | PRN

## 2012-05-17 MED ORDER — ONDANSETRON HCL 4 MG PO TABS: 4.0000 mg | ORAL_TABLET | Freq: Three times a day (TID) | ORAL | Status: DC | PRN

## 2012-05-17 MED ORDER — HALOPERIDOL 5 MG PO TABS
5.0000 mg | ORAL_TABLET | Freq: Two times a day (BID) | ORAL | Status: DC
  Administered 2012-05-17 – 2012-05-18 (×3): 5 mg via ORAL
  Filled 2012-05-17 (×3): qty 1

## 2012-05-17 MED ORDER — NICOTINE 21 MG/24HR TD PT24
21.0000 mg | MEDICATED_PATCH | Freq: Every day | TRANSDERMAL | Status: DC
  Administered 2012-05-18: 21 mg via TRANSDERMAL
  Filled 2012-05-17: qty 1

## 2012-05-17 MED ORDER — TRAZODONE HCL 50 MG PO TABS
50.0000 mg | ORAL_TABLET | Freq: Every day | ORAL | Status: DC
  Administered 2012-05-17: 50 mg via ORAL
  Filled 2012-05-17: qty 1

## 2012-05-17 NOTE — ED Notes (Signed)
EDP notfied of ETOH level. No new orders given at this time. Will continue to monitor.

## 2012-05-17 NOTE — ED Provider Notes (Signed)
History     CSN: [id number]  Arrival date & time 05/17/12  1125   First MD Initiated Contact with Patient 05/17/12 1157      Chief Complaint  Patient presents with  . detox   . Medical Clearance    (Consider location/radiation/quality/duration/timing/severity/associated sxs/prior treatment) HPI Comments: Patient is a 61 year old male with a past medical history of COPD who presents to the ED for detox from alcohol. Patient reports an extensive history of drinking 4-5 40oz beers daily. Patient reports being "sick of drinking" and wants to quit. His last drink was this morning before coming to the ED. Patient's family is at the bedside and are supportive of his detox. Patient reports 30 pack year history of cigarette use. Patient denies any other substance abuse including illicit drugs. Patient denies fever, chills, NVD, headache, chest pain, SOB, abdominal pain.    Past Medical History  Diagnosis Date  . Hypertension   . Hyperlipemia   . Emphysema   . Stroke   . Chronic pain   . Cirrhosis of liver   . Fall   . Dental injury   . Bone spur of other site     Left Foot  . Impaired mobility   . Total self care deficit   . Pneumonia   . Emphysema   . ETOH abuse   . Elevated LFTs     Past Surgical History  Procedure Date  . No past surgeries   . Colonoscopy 01/06/2012    Procedure: COLONOSCOPY;  Surgeon: Theda Belfast, MD;  Location: [facility] ENDOSCOPY;  Service: Endoscopy;  Laterality: N/A;  . Esophagogastroduodenoscopy 01/06/2012    Procedure: ESOPHAGOGASTRODUODENOSCOPY (EGD);  Surgeon: Theda Belfast, MD;  Location: [facility] ENDOSCOPY;  Service: Endoscopy;  Laterality: N/A;  . Esophagogastroduodenoscopy 01/09/2012    Procedure: ESOPHAGOGASTRODUODENOSCOPY (EGD);  Surgeon: Theda Belfast, MD;  Location: [facility] ENDOSCOPY;  Service: Endoscopy;  Laterality: N/A;  . Multiple extractions with alveoloplasty 03/29/2012    Procedure: MULTIPLE EXTRACION WITH ALVEOLOPLASTY;  Surgeon: Georgia Lopes, DDS;   Location: [facility] OR;  Service: Oral Surgery;  Laterality: Bilateral;    No family history on file.  History  Substance Use Topics  . Smoking status: Current Every Day Smoker -- 1.0 packs/day for 30 years    Types: Cigarettes  . Smokeless tobacco: Not on file  . Alcohol Use: 1.2 oz/week    2 Cans of beer per week     Comment: per pt (2) 40oz daily      Review of Systems  Psychiatric/Behavioral:       Substance abuse  All other systems reviewed and are negative.    Allergies  Poison ivy extract  Home Medications   Current Outpatient Rx  Name  Route  Sig  Dispense  Refill  . ACAMPROSATE CALCIUM 333 MG PO TBEC   Oral   Take 666 mg by mouth 3 (three) times daily.         . ALBUTEROL SULFATE HFA 108 (90 BASE) MCG/ACT IN AERS   Inhalation   Inhale 2 puffs into the lungs every 6 (six) hours as needed. Shortness of breath         . BUDESONIDE-FORMOTEROL FUMARATE 80-4.5 MCG/ACT IN AERO   Inhalation   Inhale 2 puffs into the lungs 2 (two) times daily. For COPD.         Marland Kitchen FERROUS SULFATE 325 (65 FE) MG PO TABS   Oral   Take 325 mg by mouth daily with  breakfast. For low iron.         Marland Kitchen FOLIC ACID 1 MG PO TABS   Oral   Take 1 mg by mouth daily. For low folate.         . GUAIFENESIN-DM 100-10 MG/5ML PO SYRP   Oral   Take 5 mLs by mouth 3 (three) times daily as needed.         Marland Kitchen HYDROCODONE-ACETAMINOPHEN 7.5-500 MG PO TABS   Oral   Take 1 tablet by mouth every 6 (six) hours as needed. Pain         . NICOTINE 7 MG/24HR TD PT24   Transdermal   Place 1 patch onto the skin daily.         . OXYCODONE-ACETAMINOPHEN 5-325 MG PO TABS   Oral   Take 2 tablets by mouth every 4 (four) hours as needed for pain.   40 tablet   0   . PRAVASTATIN SODIUM 40 MG PO TABS   Oral   Take 80 mg by mouth daily. To lower your cholesterol.         Marland Kitchen VITAMIN B-1 100 MG PO TABS   Oral   Take 100 mg by mouth daily. For vitamin B deficiency.         . TRAMADOL HCL 50 MG  PO TABS   Oral   Take 50 mg by mouth 2 (two) times daily as needed. For pain         . TRAZODONE HCL 50 MG PO TABS   Oral   Take 50 mg by mouth at bedtime.         Marland Kitchen VITAMIN B-12 100 MCG PO TABS   Oral   Take 100 mcg by mouth daily. For low B12.         Marland Kitchen VITAMIN E 200 UNITS PO CAPS   Oral   Take 200 Units by mouth daily.           BP 128/87  Pulse 95  Temp 98.7 F (37.1 C) (Oral)  Resp 22  SpO2 95%  Physical Exam  Nursing note and vitals reviewed. Constitutional: He is oriented to person, place, and time. He appears well-developed and well-nourished. No distress.  HENT:  Head: Normocephalic and atraumatic.  Eyes: Conjunctivae normal and EOM are normal. Pupils are equal, round, and reactive to light.  Neck: Normal range of motion. Neck supple.  Cardiovascular: Normal rate and regular rhythm.  Exam reveals no gallop and no friction rub.   No murmur heard. Pulmonary/Chest: Effort normal and breath sounds normal. He has no wheezes. He has no rales. He exhibits no tenderness.  Abdominal: Soft. He exhibits no distension. There is no tenderness.  Musculoskeletal: Normal range of motion.  Neurological: He is alert and oriented to person, place, and time. Coordination normal.       Speech is goal-oriented. Moves limbs without ataxia.   Skin: Skin is warm and dry. He is not diaphoretic.  Psychiatric: He has a normal mood and affect. His behavior is normal.    ED Course  Procedures (including critical care time)  Labs Reviewed  CBC - Abnormal; Notable for the following:    RBC 4.15 (*)     Hemoglobin 11.0 (*)     HCT 32.1 (*)     MCV 77.3 (*)     RDW 17.0 (*)     All other components within normal limits  COMPREHENSIVE METABOLIC PANEL - Abnormal; Notable for the following:  Total Protein 8.9 (*)     AST 106 (*)     Alkaline Phosphatase 127 (*)     Total Bilirubin 0.2 (*)     All other components within normal limits  ETHANOL - Abnormal; Notable for the  following:    Alcohol, Ethyl (B) 436 (*)     All other components within normal limits  URINE RAPID DRUG SCREEN (HOSP PERFORMED) - Abnormal; Notable for the following:    Opiates POSITIVE (*)     All other components within normal limits   No results found.   No diagnosis found.    MDM  12:41 PM Patient is medically cleared and can proceed with ACT team assessment.         Emilia Beck, PA-C 05/17/12 1536

## 2012-05-17 NOTE — ED Notes (Signed)
Pt states he is here for alcohol detox and says he has went through withdrawals before. Pt states that he drinks 40oz drinks/day but does not specify an exact amount.

## 2012-05-17 NOTE — ED Provider Notes (Addendum)
History     CSN: [id number]  Arrival date & time 05/17/12  1125   First MD Initiated Contact with Patient 05/17/12 1157      Chief Complaint  Patient presents with  . detox   . Medical Clearance    (Consider location/radiation/quality/duration/timing/severity/associated sxs/prior treatment) HPI Comments: Has been here in the past for same.  Pt states he needs to stop drinking but can't do it on his own.  Patient is a 61 y.o. male presenting with intoxication. The history is provided by the patient.  Alcohol Intoxication This is a chronic problem. Episode onset: states he last drank last night. The problem occurs constantly. The problem has been gradually worsening. Pertinent negatives include no chest pain, no abdominal pain and no shortness of breath. Nothing aggravates the symptoms. Nothing relieves the symptoms. He has tried nothing for the symptoms. The treatment provided no relief.    Past Medical History  Diagnosis Date  . Hypertension   . Hyperlipemia   . Emphysema   . Stroke   . Chronic pain   . Cirrhosis of liver   . Fall   . Dental injury   . Bone spur of other site     Left Foot  . Impaired mobility   . Total self care deficit   . Pneumonia   . Emphysema   . ETOH abuse   . Elevated LFTs     Past Surgical History  Procedure Date  . No past surgeries   . Colonoscopy 01/06/2012    Procedure: COLONOSCOPY;  Surgeon: Theda Belfast, MD;  Location: [facility] ENDOSCOPY;  Service: Endoscopy;  Laterality: N/A;  . Esophagogastroduodenoscopy 01/06/2012    Procedure: ESOPHAGOGASTRODUODENOSCOPY (EGD);  Surgeon: Theda Belfast, MD;  Location: [facility] ENDOSCOPY;  Service: Endoscopy;  Laterality: N/A;  . Esophagogastroduodenoscopy 01/09/2012    Procedure: ESOPHAGOGASTRODUODENOSCOPY (EGD);  Surgeon: Theda Belfast, MD;  Location: [facility] ENDOSCOPY;  Service: Endoscopy;  Laterality: N/A;  . Multiple extractions with alveoloplasty 03/29/2012    Procedure: MULTIPLE EXTRACION WITH ALVEOLOPLASTY;   Surgeon: Georgia Lopes, DDS;  Location: [facility] OR;  Service: Oral Surgery;  Laterality: Bilateral;    No family history on file.  History  Substance Use Topics  . Smoking status: Current Every Day Smoker -- 1.0 packs/day for 30 years    Types: Cigarettes  . Smokeless tobacco: Not on file  . Alcohol Use: 1.2 oz/week    2 Cans of beer per week     Comment: per pt (2) 40oz daily      Review of Systems  Respiratory: Negative for shortness of breath.   Cardiovascular: Negative for chest pain.  Gastrointestinal: Negative for abdominal pain.  All other systems reviewed and are negative.    Allergies  Poison ivy extract  Home Medications   Current Outpatient Rx  Name  Route  Sig  Dispense  Refill  . ACAMPROSATE CALCIUM 333 MG PO TBEC   Oral   Take 666 mg by mouth 3 (three) times daily.         . ALBUTEROL SULFATE HFA 108 (90 BASE) MCG/ACT IN AERS   Inhalation   Inhale 2 puffs into the lungs every 6 (six) hours as needed. Shortness of breath         . BUDESONIDE-FORMOTEROL FUMARATE 80-4.5 MCG/ACT IN AERO   Inhalation   Inhale 2 puffs into the lungs 2 (two) times daily. For COPD.         Marland Kitchen FERROUS SULFATE 325 (65 FE) MG  PO TABS   Oral   Take 325 mg by mouth daily with breakfast. For low iron.         Marland Kitchen FOLIC ACID 1 MG PO TABS   Oral   Take 1 mg by mouth daily. For low folate.         . GUAIFENESIN-DM 100-10 MG/5ML PO SYRP   Oral   Take 5 mLs by mouth 3 (three) times daily as needed.         Marland Kitchen HYDROCODONE-ACETAMINOPHEN 7.5-500 MG PO TABS   Oral   Take 1 tablet by mouth every 6 (six) hours as needed. Pain         . NICOTINE 7 MG/24HR TD PT24   Transdermal   Place 1 patch onto the skin daily.         . OXYCODONE-ACETAMINOPHEN 5-325 MG PO TABS   Oral   Take 2 tablets by mouth every 4 (four) hours as needed for pain.   40 tablet   0   . PRAVASTATIN SODIUM 40 MG PO TABS   Oral   Take 80 mg by mouth daily. To lower your cholesterol.         Marland Kitchen  VITAMIN B-1 100 MG PO TABS   Oral   Take 100 mg by mouth daily. For vitamin B deficiency.         . TRAMADOL HCL 50 MG PO TABS   Oral   Take 50 mg by mouth 2 (two) times daily as needed. For pain         . TRAZODONE HCL 50 MG PO TABS   Oral   Take 50 mg by mouth at bedtime.         Marland Kitchen VITAMIN B-12 100 MCG PO TABS   Oral   Take 100 mcg by mouth daily. For low B12.         Marland Kitchen VITAMIN E 200 UNITS PO CAPS   Oral   Take 200 Units by mouth daily.           BP 128/87  Pulse 95  Temp 98.7 F (37.1 C) (Oral)  Resp 22  SpO2 95%  Physical Exam  Nursing note and vitals reviewed. Constitutional: He is oriented to person, place, and time. He appears well-developed and well-nourished. No distress.       Sleepy but wakes up with stimuli.  Smells of alcohol.  HENT:  Head: Normocephalic and atraumatic.  Mouth/Throat: Oropharynx is clear and moist.  Eyes: Conjunctivae normal and EOM are normal. Pupils are equal, round, and reactive to light.  Neck: Normal range of motion. Neck supple.  Cardiovascular: Normal rate, regular rhythm and intact distal pulses.   No murmur heard. Pulmonary/Chest: Effort normal and breath sounds normal. No respiratory distress. He has no wheezes. He has no rales.  Abdominal: Soft. He exhibits no distension. There is no tenderness. There is no rebound and no guarding.  Musculoskeletal: Normal range of motion. He exhibits no edema and no tenderness.  Neurological: He is alert and oriented to person, place, and time.  Skin: Skin is warm and dry. No rash noted. No erythema.  Psychiatric: He has a normal mood and affect. His behavior is normal. He expresses no homicidal and no suicidal ideation.    ED Course  Procedures (including critical care time)  Labs Reviewed  CBC - Abnormal; Notable for the following:    RBC 4.15 (*)     Hemoglobin 11.0 (*)     HCT 32.1 (*)  MCV 77.3 (*)     RDW 17.0 (*)     All other components within normal limits    COMPREHENSIVE METABOLIC PANEL - Abnormal; Notable for the following:    Total Protein 8.9 (*)     AST 106 (*)     Alkaline Phosphatase 127 (*)     Total Bilirubin 0.2 (*)     All other components within normal limits  ETHANOL - Abnormal; Notable for the following:    Alcohol, Ethyl (B) 436 (*)     All other components within normal limits  URINE RAPID DRUG SCREEN (HOSP PERFORMED) - Abnormal; Notable for the following:    Opiates POSITIVE (*)     All other components within normal limits   No results found.   1. Alcohol abuse       MDM   Patient arrived today intoxicated. He states that he is drinking too much and needs help. However his blood alcohol today is over 400. He has no focal complaints and normal vital signs.  Medical clearance labs are within normal limits except for being positive for opiates. On my evaluation patient is still intoxicated in room require suffering up before he can undergo any evaluation.   9:15 PM Pt still requesting detox     Gwyneth Sprout, MD 05/17/12 1532  Gwyneth Sprout, MD 05/17/12 2115  Gwyneth Sprout, MD 05/17/12 2116

## 2012-05-17 NOTE — ED Notes (Signed)
Critical ETOH level 436. Will notify MD and continue to monitor the pt.

## 2012-05-17 NOTE — ED Notes (Addendum)
Pt's wife took all of his belongings home. Pt's wife did not take pt's suitcase or coat.

## 2012-05-17 NOTE — ED Notes (Signed)
Pt presenting to ed with c/o needing detox. Pt states he has had 4-5 40oz beers today. Pt denies SI/HI at this time

## 2012-05-18 ENCOUNTER — Inpatient Hospital Stay (HOSPITAL_COMMUNITY)
Admission: EM | Admit: 2012-05-18 | Discharge: 2012-05-25 | DRG: 897 | Disposition: A | Discharge status: Home / Self Care | Payer: Federal, State, Local not specified - Other | Source: Ambulatory Visit | Attending: Psychiatry | Admitting: Psychiatry

## 2012-05-18 ENCOUNTER — Encounter (HOSPITAL_COMMUNITY): Payer: Self-pay | Admitting: *Deleted

## 2012-05-18 ENCOUNTER — Emergency Department (HOSPITAL_COMMUNITY): Payer: Medicaid Other

## 2012-05-18 ENCOUNTER — Other Ambulatory Visit: Payer: Self-pay

## 2012-05-18 DIAGNOSIS — R55 Syncope and collapse: Secondary | ICD-10-CM

## 2012-05-18 DIAGNOSIS — J189 Pneumonia, unspecified organism: Secondary | ICD-10-CM

## 2012-05-18 DIAGNOSIS — E876 Hypokalemia: Secondary | ICD-10-CM

## 2012-05-18 DIAGNOSIS — F10988 Alcohol use, unspecified with other alcohol-induced disorder: Secondary | ICD-10-CM | POA: Diagnosis present

## 2012-05-18 DIAGNOSIS — E871 Hypo-osmolality and hyponatremia: Secondary | ICD-10-CM

## 2012-05-18 DIAGNOSIS — F1994 Other psychoactive substance use, unspecified with psychoactive substance-induced mood disorder: Secondary | ICD-10-CM

## 2012-05-18 DIAGNOSIS — J690 Pneumonitis due to inhalation of food and vomit: Secondary | ICD-10-CM

## 2012-05-18 DIAGNOSIS — J438 Other emphysema: Secondary | ICD-10-CM | POA: Diagnosis present

## 2012-05-18 DIAGNOSIS — E785 Hyperlipidemia, unspecified: Secondary | ICD-10-CM

## 2012-05-18 DIAGNOSIS — D649 Anemia, unspecified: Secondary | ICD-10-CM

## 2012-05-18 DIAGNOSIS — J449 Chronic obstructive pulmonary disease, unspecified: Secondary | ICD-10-CM

## 2012-05-18 DIAGNOSIS — E611 Iron deficiency: Secondary | ICD-10-CM

## 2012-05-18 DIAGNOSIS — D72829 Elevated white blood cell count, unspecified: Secondary | ICD-10-CM

## 2012-05-18 DIAGNOSIS — K7030 Alcoholic cirrhosis of liver without ascites: Secondary | ICD-10-CM | POA: Diagnosis present

## 2012-05-18 DIAGNOSIS — F1020 Alcohol dependence, uncomplicated: Principal | ICD-10-CM

## 2012-05-18 DIAGNOSIS — S2249XA Multiple fractures of ribs, unspecified side, initial encounter for closed fracture: Secondary | ICD-10-CM

## 2012-05-18 DIAGNOSIS — E860 Dehydration: Secondary | ICD-10-CM

## 2012-05-18 DIAGNOSIS — I10 Essential (primary) hypertension: Secondary | ICD-10-CM | POA: Diagnosis present

## 2012-05-18 DIAGNOSIS — F10239 Alcohol dependence with withdrawal, unspecified: Secondary | ICD-10-CM | POA: Diagnosis present

## 2012-05-18 DIAGNOSIS — F10939 Alcohol use, unspecified with withdrawal, unspecified: Secondary | ICD-10-CM

## 2012-05-18 DIAGNOSIS — R748 Abnormal levels of other serum enzymes: Secondary | ICD-10-CM

## 2012-05-18 LAB — URINALYSIS, ROUTINE W REFLEX MICROSCOPIC
Glucose, UA: NEGATIVE mg/dL
Ketones, ur: NEGATIVE mg/dL
Nitrite: NEGATIVE
pH: 5.5 (ref 5.0–8.0)

## 2012-05-18 LAB — URINE MICROSCOPIC-ADD ON

## 2012-05-18 MED ORDER — LOPERAMIDE HCL 2 MG PO CAPS: 2.0000 mg | ORAL_CAPSULE | ORAL | Status: AC | PRN

## 2012-05-18 MED ORDER — CHLORDIAZEPOXIDE HCL 25 MG PO CAPS
25.0000 mg | ORAL_CAPSULE | Freq: Every day | ORAL | Status: AC
  Administered 2012-05-22: 25 mg via ORAL
  Filled 2012-05-18: qty 1

## 2012-05-18 MED ORDER — VITAMIN B-1 100 MG PO TABS
100.0000 mg | ORAL_TABLET | Freq: Every day | ORAL | Status: DC
  Administered 2012-05-19 – 2012-05-25 (×7): 100 mg via ORAL
  Filled 2012-05-18 (×9): qty 1

## 2012-05-18 MED ORDER — LORAZEPAM 2 MG/ML IJ SOLN
2.0000 mg | Freq: Once | INTRAMUSCULAR | Status: AC
  Administered 2012-05-18: 2 mg via INTRAVENOUS
  Filled 2012-05-18: qty 1

## 2012-05-18 MED ORDER — ONDANSETRON 4 MG PO TBDP: 4.0000 mg | ORAL_TABLET | Freq: Four times a day (QID) | ORAL | Status: AC | PRN

## 2012-05-18 MED ORDER — CHLORDIAZEPOXIDE HCL 25 MG PO CAPS
50.0000 mg | ORAL_CAPSULE | Freq: Once | ORAL | Status: AC
  Administered 2012-05-18: 50 mg via ORAL
  Filled 2012-05-18: qty 1
  Filled 2012-05-18: qty 2

## 2012-05-18 MED ORDER — HYDROXYZINE HCL 25 MG PO TABS: 25.0000 mg | ORAL_TABLET | Freq: Four times a day (QID) | ORAL | Status: DC | PRN

## 2012-05-18 MED ORDER — VITAMIN B-1 100 MG PO TABS
100.0000 mg | ORAL_TABLET | Freq: Every day | ORAL | Status: DC
  Filled 2012-05-18: qty 1

## 2012-05-18 MED ORDER — CHLORDIAZEPOXIDE HCL 25 MG PO CAPS
25.0000 mg | ORAL_CAPSULE | Freq: Three times a day (TID) | ORAL | Status: AC
  Administered 2012-05-20 (×3): 25 mg via ORAL
  Filled 2012-05-18 (×3): qty 1

## 2012-05-18 MED ORDER — HYDROXYZINE HCL 25 MG PO TABS
25.0000 mg | ORAL_TABLET | Freq: Four times a day (QID) | ORAL | Status: AC | PRN
  Administered 2012-05-21: 25 mg via ORAL

## 2012-05-18 MED ORDER — CHLORDIAZEPOXIDE HCL 25 MG PO CAPS
25.0000 mg | ORAL_CAPSULE | ORAL | Status: AC
  Administered 2012-05-21 (×2): 25 mg via ORAL
  Filled 2012-05-18 (×2): qty 1

## 2012-05-18 MED ORDER — ONDANSETRON 4 MG PO TBDP: 4.0000 mg | ORAL_TABLET | Freq: Four times a day (QID) | ORAL | Status: DC | PRN

## 2012-05-18 MED ORDER — LOPERAMIDE HCL 2 MG PO CAPS: 2.0000 mg | ORAL_CAPSULE | ORAL | Status: DC | PRN

## 2012-05-18 MED ORDER — CHLORDIAZEPOXIDE HCL 25 MG PO CAPS
25.0000 mg | ORAL_CAPSULE | Freq: Four times a day (QID) | ORAL | Status: AC
  Administered 2012-05-18 – 2012-05-19 (×6): 25 mg via ORAL
  Filled 2012-05-18 (×5): qty 1

## 2012-05-18 MED ORDER — THIAMINE HCL 100 MG/ML IJ SOLN
100.0000 mg | Freq: Once | INTRAMUSCULAR | Status: AC
  Administered 2012-05-18: 100 mg via INTRAMUSCULAR

## 2012-05-18 MED ORDER — THIAMINE HCL 100 MG/ML IJ SOLN: 100.0000 mg | Freq: Once | INTRAMUSCULAR | Status: DC

## 2012-05-18 MED ORDER — ADULT MULTIVITAMIN W/MINERALS CH
1.0000 | ORAL_TABLET | Freq: Every day | ORAL | Status: DC
  Administered 2012-05-18 – 2012-05-25 (×8): 1 via ORAL
  Filled 2012-05-18 (×9): qty 1

## 2012-05-18 MED ORDER — SODIUM CHLORIDE 0.9 % IV BOLUS (SEPSIS)
500.0000 mL | Freq: Once | INTRAVENOUS | Status: AC
  Administered 2012-05-18: 500 mL via INTRAVENOUS

## 2012-05-18 MED ORDER — CHLORDIAZEPOXIDE HCL 25 MG PO CAPS: 25.0000 mg | ORAL_CAPSULE | Freq: Four times a day (QID) | ORAL | Status: DC | PRN

## 2012-05-18 MED ORDER — LORAZEPAM 1 MG PO TABS: 1.0000 mg | ORAL_TABLET | Freq: Three times a day (TID) | ORAL | Status: DC | PRN

## 2012-05-18 MED ORDER — CHLORDIAZEPOXIDE HCL 25 MG PO CAPS
25.0000 mg | ORAL_CAPSULE | Freq: Four times a day (QID) | ORAL | Status: AC | PRN
  Administered 2012-05-19 – 2012-05-20 (×2): 25 mg via ORAL
  Filled 2012-05-18 (×2): qty 1

## 2012-05-18 MED ORDER — ADULT MULTIVITAMIN W/MINERALS CH
1.0000 | ORAL_TABLET | Freq: Every day | ORAL | Status: DC
Start: 2012-05-18 — End: 2012-05-18
  Filled 2012-05-18 (×2): qty 1

## 2012-05-18 NOTE — Progress Notes (Signed)
Patient came from [facility] ED.  Patient in wheelchair with cane.  Patient has diaper on, wears glasses.  Mother stated he had all teeth extracted in the past month.  Needs pureed diet.  Hx stroke, copd, pnuemonia, HTN.  Has round circle open skin no drainage on back of neck.  Dry skin, old bruises legs/arms.  Tatoo on right arm.  Denied SI and HI.   Denied A/V hallucinations.   Denied pain.  Contracts for safety.   Drank since age of [age] years old.   Denied using any drugs, only uses alcohol daily.  Four 40 ounces beers daily.

## 2012-05-18 NOTE — ED Notes (Signed)
When calling report to [facility], person taking report requests that pt be held here until 06-1299 as they are getting "a lot of people at the same time." Notified charge nurse of transport delay.

## 2012-05-18 NOTE — BH Assessment (Signed)
Assessment Note   Peter [name] is a 60 y.o. male who presents emerg dept for detox.  Pt denies SI/HI/Psych.  Pt has been drinking since age 32, ingesting 4-40's daily, last drink 05/17/12. Pt says---"I'm tired of drinking and feeling bad".  Pt denies any seizure activity, but had 1 blackout in the past.  Pt c/o w/d sxs: tremors.  No legal charges.  Pt states has been to [facility] 6 yrs ago for detox treatment.    Axis I: Alcohol Dep Axis II: Deferred Axis III:  Past Medical History  Diagnosis Date  . Hypertension   . Hyperlipemia   . Emphysema   . Stroke   . Chronic pain   . Cirrhosis of liver   . Fall   . Dental injury   . Bone spur of other site     Left Foot  . Impaired mobility   . Total self care deficit   . Pneumonia   . Emphysema   . ETOH abuse   . Elevated LFTs    Axis IV: other psychosocial or environmental problems, problems related to social environment and problems with primary support group Axis V: 51-60 moderate symptoms  Past Medical History:  Past Medical History  Diagnosis Date  . Hypertension   . Hyperlipemia   . Emphysema   . Stroke   . Chronic pain   . Cirrhosis of liver   . Fall   . Dental injury   . Bone spur of other site     Left Foot  . Impaired mobility   . Total self care deficit   . Pneumonia   . Emphysema   . ETOH abuse   . Elevated LFTs     Past Surgical History  Procedure Date  . No past surgeries   . Colonoscopy 01/06/2012    Procedure: COLONOSCOPY;  Surgeon: Theda Belfast, MD;  Location: [facility] ENDOSCOPY;  Service: Endoscopy;  Laterality: N/A;  . Esophagogastroduodenoscopy 01/06/2012    Procedure: ESOPHAGOGASTRODUODENOSCOPY (EGD);  Surgeon: Theda Belfast, MD;  Location: [facility] ENDOSCOPY;  Service: Endoscopy;  Laterality: N/A;  . Esophagogastroduodenoscopy 01/09/2012    Procedure: ESOPHAGOGASTRODUODENOSCOPY (EGD);  Surgeon: Theda Belfast, MD;  Location: [facility] ENDOSCOPY;  Service: Endoscopy;  Laterality: N/A;  . Multiple extractions with  alveoloplasty 03/29/2012    Procedure: MULTIPLE EXTRACION WITH ALVEOLOPLASTY;  Surgeon: Georgia Lopes, DDS;  Location: [facility] OR;  Service: Oral Surgery;  Laterality: Bilateral;    Family History: No family history on file.  Social History:  reports that he has been smoking Cigarettes.  He has a 30 pack-year smoking history. He does not have any smokeless tobacco history on file. He reports that he drinks about 1.2 ounces of alcohol per week. He reports that he does not use illicit drugs.  Additional Social History:  Alcohol / Drug Use Pain Medications: None  Prescriptions: None  Over the Counter: None  History of alcohol / drug use?: Yes Longest period of sobriety (when/how long): Unk  Substance #1 Name of Substance 1: Alcohol  1 - Age of First Use: 10 YOM  1 - Amount (size/oz): 4-40's  1 - Frequency: Daily  1 - Duration: On-going  1 - Last Use / Amount: 05/17/12  CIWA: CIWA-Ar BP: 154/88 mmHg Pulse Rate: 120  Nausea and Vomiting: no nausea and no vomiting Tactile Disturbances: none Tremor: three Auditory Disturbances: not present Paroxysmal Sweats: no sweat visible Visual Disturbances: not present Anxiety: no anxiety, at ease Headache, Fullness in Head: none present  Agitation: normal activity Orientation and Clouding of Sensorium: oriented and can do serial additions CIWA-Ar Total: 3  COWS:    Allergies:  Allergies  Allergen Reactions  . Poison Ivy Extract (Extract Of Poison Ivy) Rash    Home Medications:  (Not in a hospital admission)  OB/GYN Status:  No LMP for male patient.  General Assessment Data Location of Assessment: [facility] ED Living Arrangements: Other relatives (Brother lives with pt ) Can pt return to current living arrangement?: Yes Admission Status: Voluntary Is patient capable of signing voluntary admission?: Yes Transfer from: Acute Hospital Referral Source: MD  Education Status Is patient currently in school?: No Current Grade: None  Highest grade  of school patient has completed: None  Name of school: None  Contact person: None   Risk to self Suicidal Ideation: No Suicidal Intent: No Is patient at risk for suicide?: No Suicidal Plan?: No Access to Means: No What has been your use of drugs/alcohol within the last 12 months?: Abusing alcohol  Previous Attempts/Gestures: No How many times?: 0  Other Self Harm Risks: None  Triggers for Past Attempts: None known Intentional Self Injurious Behavior: None Family Suicide History: No Recent stressful life event(s): Other (Comment) (Chronic SA) Persecutory voices/beliefs?: No Depression: Yes Depression Symptoms: Loss of interest in usual pleasures Substance abuse history and/or treatment for substance abuse?: Yes Suicide prevention information given to non-admitted patients: Not applicable  Risk to Others Homicidal Ideation: No Thoughts of Harm to Others: No Current Homicidal Intent: No Current Homicidal Plan: No Access to Homicidal Means: No Identified Victim: None  History of harm to others?: No Assessment of Violence: None Noted Violent Behavior Description: None  Does patient have access to weapons?: No Criminal Charges Pending?: No Does patient have a court date: No  Psychosis Hallucinations: None noted Delusions: None noted  Mental Status Report Appear/Hygiene: Disheveled Eye Contact: Good Motor Activity: Unremarkable Speech: Logical/coherent Level of Consciousness: Alert Mood: Sad Affect: Sad Anxiety Level: None Thought Processes: Coherent;Relevant Judgement: Unimpaired Orientation: Person;Place;Time;Situation Obsessive Compulsive Thoughts/Behaviors: None  Cognitive Functioning Concentration: Normal Memory: Recent Intact;Remote Intact IQ: Average Insight: Fair Impulse Control: Fair Appetite: Good Weight Loss: 0  Weight Gain: 0  Sleep: No Change Total Hours of Sleep: 6  Vegetative Symptoms: None  ADLScreening [facility]) Patient's  cognitive ability adequate to safely complete daily activities?: Yes Patient able to express need for assistance with ADLs?: Yes Independently performs ADLs?: Yes (appropriate for developmental age)  Abuse/Neglect [facility]) Physical Abuse: Denies Verbal Abuse: Denies Sexual Abuse: Denies  Prior Inpatient Therapy Prior Inpatient Therapy: Yes Prior Therapy Dates: 2007 Prior Therapy Facilty/Provider(s): [facility]  Reason for Treatment: Detox  Prior Outpatient Therapy Prior Outpatient Therapy: No Prior Therapy Dates: None  Prior Therapy Facilty/Provider(s): None  Reason for Treatment: None   ADL Screening (condition at time of admission) Patient's cognitive ability adequate to safely complete daily activities?: Yes Patient able to express need for assistance with ADLs?: Yes Independently performs ADLs?: Yes (appropriate for developmental age) Weakness of Legs: None Weakness of Arms/Hands: None  Home Assistive Devices/Equipment Home Assistive Devices/Equipment: Eyeglasses  Therapy Consults (therapy consults require a physician order) PT Evaluation Needed: No OT Evalulation Needed: No SLP Evaluation Needed: No Abuse/Neglect Assessment (Assessment to be complete while patient is alone) Physical Abuse: Denies Verbal Abuse: Denies Sexual Abuse: Denies Exploitation of patient/patient's resources: Denies Self-Neglect: Denies Values / Beliefs Cultural Requests During Hospitalization: None Spiritual Requests During Hospitalization: None Consults Spiritual Care Consult Needed: No Social Work Consult Needed: No  Advance Directives (For Healthcare) Advance Directive: Patient does not have advance directive;Patient would not like information Pre-existing out of facility DNR order (yellow form or pink MOST form): No Nutrition Screen- [facility] Patient's home diet: Regular Have you recently lost weight without trying?: No Have you been eating poorly because of a decreased appetite?:  No Malnutrition Screening Tool Score: 0   Additional Information 1:1 In Past 12 Months?: No CIRT Risk: No Elopement Risk: No Does patient have medical clearance?: Yes     Disposition:  Disposition Disposition of Patient: Inpatient treatment program;Referred to ([facility]) Type of inpatient treatment program: Adult Patient referred to: [facility]  On Site Evaluation by:   Reviewed with Physician:     Beatrix Shipper C 05/18/2012 12:14 AM

## 2012-05-18 NOTE — Progress Notes (Signed)
Patient ID: Peter [name], male   DOB: 12-29-1950, 61 y.o.   MRN: [id number] D: Pt is awake and active on the unit this AM. Pt denies SI/HI and A/V hallucinations. Pt is not able to participate in the milieu but he is cooperative with staff. It would be unsafe for the pt to attend groups at this time. Pt is able to safely ambulate between the bathroom and bed but not to the dinning facility at this time. Pt's most recent CIWA score was 6. Pt mood is depressed and lethargic and his affect is blank.  A: Writer utilized therapeutic communication, encouraged pt to discuss feelings with staff and administered medication per MD orders. Writer performed EKG per PA orders due to tachycardia and to rule out dysrhythmia. Quality of pt pulse is regular.  PT fell asleep during EKG and has difficulty keeping his eyes open. Pt is incontinent of urine and requires diapers be changed periodically. MHT provided bath and new diaper. Pt is currently asleep in bed.   R: Pt is tolerating medications well. Writer will continue to monitor. 15 minute checks are ongoing for safety.

## 2012-05-18 NOTE — ED Provider Notes (Signed)
Medical screening examination/treatment/procedure(s) were performed by non-physician practitioner and as supervising physician I was immediately available for consultation/collaboration.   Lyanne Co, MD 05/18/12 [id number]

## 2012-05-18 NOTE — ED Notes (Addendum)
Notified Dr. Read Drivers of pt's temperature of 101.0 and pulse rate of 120. Also notified of Dr. Magda Bernheim of pt's increased tremors and it being too early to give prn ativan.

## 2012-05-18 NOTE — [facility] Counselor (Signed)
Pt accepted Peter Sievert, PA to Dr. Geoffery Lyons Room 301-1. EDP and nurse made aware of disposition. Nurse to Nurse report [id number]. Patient is voluntary and will need to be transferred via security.

## 2012-05-18 NOTE — ED Notes (Addendum)
Pt attempted to void but unable to at this time. Encouraged po intake.

## 2012-05-18 NOTE — Tx Team (Signed)
Initial Interdisciplinary Treatment Plan  PATIENT STRENGTHS: (choose at least two) Communication skills Motivation for treatment/growth Supportive family/friends  PATIENT STRESSORS: Financial difficulties Health problems Medication change or noncompliance Substance abuse   PROBLEM LIST: Problem List/Patient Goals Date to be addressed Date deferred Reason deferred Estimated date of resolution  Substance abuse 05/18/2012   D/c        depression 05/18/2012   D/c        anxiety 05/18/2012   D/c                           DISCHARGE CRITERIA:  Ability to meet basic life and health needs Adequate post-discharge living arrangements Improved stabilization in mood, thinking, and/or behavior Medical problems require only outpatient monitoring Motivation to continue treatment in a less acute level of care Need for constant or close observation no longer present Reduction of life-threatening or endangering symptoms to within safe limits Safe-care adequate arrangements made Verbal commitment to aftercare and medication compliance Withdrawal symptoms are absent or subacute and managed without 24-hour nursing intervention  PRELIMINARY DISCHARGE PLAN: Attend aftercare/continuing care group Attend PHP/IOP Attend 12-step recovery group Outpatient therapy Return to previous living arrangement  PATIENT/FAMIILY INVOLVEMENT: This treatment plan has been presented to and reviewed with the patient, Barton Fanny.  The patient and family have been given the opportunity to ask questions and make suggestions.  Earline Mayotte 05/18/2012, 7:30 PM

## 2012-05-18 NOTE — ED Notes (Signed)
Notified Dr. Read Drivers of pt's temperature of 100.5 after receiving  600mg  of ibuprofen and also pt's inability to void to get urine sample.

## 2012-05-18 NOTE — ED Notes (Signed)
Notified Dr. Read Drivers of pt's pulse of 130 and temp of 100.0 Pt is currently sleeping in bed.

## 2012-05-18 NOTE — Tx Team (Signed)
Initial Interdisciplinary Treatment Plan  PATIENT STRENGTHS: (choose at least two) Average or above average intelligence Communication skills General fund of knowledge Supportive family/friends  PATIENT STRESSORS: Health problems Substance abuse   PROBLEM LIST: Problem List/Patient Goals Date to be addressed Date deferred Reason deferred Estimated date of resolution  ETOH dependence 18 May 2012                                                      DISCHARGE CRITERIA:  Ability to meet basic life and health needs Adequate post-discharge living arrangements Improved stabilization in mood, thinking, and/or behavior Medical problems require only outpatient monitoring Motivation to continue treatment in a less acute level of care Need for constant or close observation no longer present Reduction of life-threatening or endangering symptoms to within safe limits Safe-care adequate arrangements made Verbal commitment to aftercare and medication compliance Withdrawal symptoms are absent or subacute and managed without 24-hour nursing intervention  PRELIMINARY DISCHARGE PLAN: Attend aftercare/continuing care group Outpatient therapy Return to previous living arrangement  PATIENT/FAMIILY INVOLVEMENT: This treatment plan has been presented to and reviewed with the patient, Peter [name], and/or family member.  The patient and family have been given the opportunity to ask questions and make suggestions.  [name] 05/18/2012, 7:26 PM

## 2012-05-18 NOTE — ED Provider Notes (Signed)
Filed Vitals:   05/18/12 1126  BP: 134/82  Pulse: 114  Temp: 99.6 F (37.6 C)  Resp: 16   Patient requesting alcohol detox. Denies any current physical complaints. Patient on CIWA, receiving Ativan, and fluids for mild tachycardia.  Jones Skene, MD 05/18/12 1742

## 2012-05-19 ENCOUNTER — Encounter (HOSPITAL_COMMUNITY): Payer: Self-pay | Admitting: Psychiatry

## 2012-05-19 DIAGNOSIS — F10239 Alcohol dependence with withdrawal, unspecified: Secondary | ICD-10-CM

## 2012-05-19 DIAGNOSIS — F1994 Other psychoactive substance use, unspecified with psychoactive substance-induced mood disorder: Secondary | ICD-10-CM | POA: Diagnosis present

## 2012-05-19 MED ORDER — TRAZODONE HCL 50 MG PO TABS
50.0000 mg | ORAL_TABLET | Freq: Every day | ORAL | Status: DC
  Administered 2012-05-19 – 2012-05-24 (×6): 50 mg via ORAL
  Filled 2012-05-19: qty 1
  Filled 2012-05-19: qty 10
  Filled 2012-05-19 (×6): qty 1

## 2012-05-19 MED ORDER — BUDESONIDE-FORMOTEROL FUMARATE 80-4.5 MCG/ACT IN AERO
2.0000 | INHALATION_SPRAY | Freq: Two times a day (BID) | RESPIRATORY_TRACT | Status: DC
  Administered 2012-05-19 – 2012-05-25 (×13): 2 via RESPIRATORY_TRACT
  Filled 2012-05-19: qty 6.9

## 2012-05-19 MED ORDER — SIMVASTATIN 5 MG PO TABS
5.0000 mg | ORAL_TABLET | Freq: Every day | ORAL | Status: DC
  Administered 2012-05-19 – 2012-05-24 (×6): 5 mg via ORAL
  Filled 2012-05-19 (×8): qty 1

## 2012-05-19 MED ORDER — VITAMIN B-12 100 MCG PO TABS
100.0000 ug | ORAL_TABLET | Freq: Every day | ORAL | Status: DC
  Administered 2012-05-19 – 2012-05-25 (×7): 100 ug via ORAL
  Filled 2012-05-19 (×10): qty 1

## 2012-05-19 MED ORDER — FOLIC ACID 1 MG PO TABS
1.0000 mg | ORAL_TABLET | Freq: Every day | ORAL | Status: DC
  Administered 2012-05-19 – 2012-05-25 (×7): 1 mg via ORAL
  Filled 2012-05-19 (×9): qty 1

## 2012-05-19 NOTE — Progress Notes (Signed)
Adult Psychosocial Assessment Update Interdisciplinary Team  Previous [facility] admissions/discharges:  Admissions Discharges  Date: 01/31/12 Date: 02/04/12  Date: Date:  Date: Date:  Date: Date:  Date: Date:   Changes since the last Psychosocial Assessment (including adherence to outpatient mental health and/or substance abuse treatment, situational issues contributing to decompensation and/or relapse).  Pt states that after he left [facility] on last admission, pt states that he went to both [facility] and [facility].  Pt states that he relapsed after being around friends that drank which influenced him to drink.              Discharge Plan 1. Will you be returning to the same living situation after discharge?   Yes: X Pt states that he has his own place in [place] that he can return to.   No:      If no, what is your plan?           2. Would you like a referral for services when you are discharged? Yes: X    If yes, for what services? Pt states that he wants to follow up with [facility] for outpatient services.    No:              Summary and Recommendations (to be completed by the evaluator)  Patient is a 61 year old African American Male with a diagnosis of Alcohol Dependence.  Patient lives in [place] alone.  Patient will benefit from crisis stabilization, medication evaluation, group therapy and psycho education in addition to case management for discharge planning.                         Signature:  Carmina Miller, 05/19/2012 8:14 AM

## 2012-05-19 NOTE — Progress Notes (Signed)
D:Pt has moderate tremors in his bed asking how long he would need to be in the hospital. Pt reports that he has detoxed before from alcohol. A:Explained to pt the need for monitoring vitals frequently. Gave medications as ordered.  R:Pt tolerating well. He denies si and hi. Pt reports detox symptoms as tremors. Will continue to monitor. Safety maintained on the unit.

## 2012-05-19 NOTE — Social Work (Signed)
Interdisciplinary Treatment Plan Update (Adult)  Date:  05/19/2012  Time Reviewed:  9:50 AM   Progress in Treatment: Attending groups: Yes Participating in groups:  Yes Taking medication as prescribed: Yes Tolerating medication:  Yes Family/Significant othe contact made:  CSW assessing for appropriate contact Patient understands diagnosis:  Yes Discussing patient identified problems/goals with staff:  Yes Medical problems stabilized or resolved:  Yes Denies suicidal/homicidal ideation: Yes Issues/concerns per patient self-inventory:  None identified Other: N/A  New problem(s) identified: None Identified  Reason for Continuation of Hospitalization: Anxiety Depression Medication stabilization Withdrawal Symptoms  Interventions implemented related to continuation of hospitalization: mood stabilization, medication monitoring and adjustment, group therapy and psycho education, safety checks q 15 mins  Additional comments: N/A  Estimated length of stay: 3-5 days  Discharge Plan: CSW is assessing for appropriate referrals.   New goal(s): N/A  Review of initial/current patient goals per problem list:    1.  Goal(s): Address substance use  Met:  No  Target date: by discharge  As evidenced by: completing detox protocol and refer to appropriate treatment  2.  Goal (s): Reduce depressive and anxiety symptoms  Met:  No  Target date: by discharge  As evidenced by: Reducing depression from a 10 to a 3 as reported by pt.     Attendees: Patient:     Family:     Physician: Geoffery Lyons, MD 05/19/2012 9:50 AM   Nursing: Roswell Miners, RN 05/19/2012 9:50 AM   Clinical Social Worker:  Reyes Ivan, LCSWA 05/19/2012  9:50 AM   Other: Chinita Greenland, RN 05/19/2012  9:50 AM   Other:  Nanine Means, NP 05/19/2012 9:52 AM   Other:  Bubba Camp, Psyc intern 05/19/2012 9:52 AM   Other:     Other:      Scribe for Treatment Team:   Reyes Ivan 05/19/2012 9:50 AM

## 2012-05-19 NOTE — Progress Notes (Signed)
Patient ID: Peter [name], male   DOB: Jul 31, 1950, 61 y.o.   MRN: [id number]  D: Patient pleasant on approach tonight. Currently denies many withdrawal symptoms except some tremors but slight. Using wheelchair to get around this evening and walking some but very slowly and has been using railings ect. No SI tonight. Heart rate still over 100 tonight (left physician sticky).  A: Staff will monitor on q 15 minute checks and follow treatments and give medications as ordered. R: Took bedtime meds with no issue and continued interacting with other peers.

## 2012-05-19 NOTE — Progress Notes (Signed)
[facility] Adult Inpatient Family/Significant Other Suicide Prevention Education  Suicide Prevention Education:  Education Completed; Davene Costain - mother [phone]),  (name of family member/significant other) has been identified by the patient as the family member/significant other with whom the patient will be residing, and identified as the person(s) who will aid the patient in the event of a mental health crisis (suicidal ideations/suicide attempt).  With written consent from the patient, the family member/significant other has been provided the following suicide prevention education, prior to the and/or following the discharge of the patient.  The suicide prevention education provided includes the following:  Suicide risk factors  Suicide prevention and interventions  National Suicide Hotline telephone number  [facility] assessment telephone number  [facility] Emergency Assistance 911  [facility] and/or Residential Mobile Crisis Unit telephone number  Request made of family/significant other to:  Remove weapons (e.g., guns, rifles, knives), all items previously/currently identified as safety concern.    Remove drugs/medications (over-the-counter, prescriptions, illicit drugs), all items previously/currently identified as a safety concern.  The family member/significant other verbalizes understanding of the suicide prevention education information provided.  The family member/significant other agrees to remove the items of safety concern listed above.  Carmina Miller 05/19/2012, 2:24 PM

## 2012-05-19 NOTE — Social Work (Signed)
Aftercare Planning Group: 05/19/2012 9:45 AM  Pt did not attend d/c planning group on this date.    [facility] Group Note : Clinical Social Worker Group Therapy  05/19/2012  1:15 PM  Type of Therapy:  Group Therapy  Participation Level:  Did Not Attend group on emotional regulations    Cheryel Kyte Horton, LCSWA 05/19/2012 3:00 pm

## 2012-05-19 NOTE — Progress Notes (Signed)
Pt laying in bed resting with eyes closed. Respirations even and unlabored. No distress noted.  

## 2012-05-19 NOTE — H&P (Signed)
Psychiatric Admission Assessment Adult  Patient Identification:  Peter [name] Date of Evaluation:  05/19/2012 Chief Complaint:  Alcohol Dependence History of Present Illness:: 61 Y/O male who comes requesting help for his alcohol problem. For the last 3 years, he has been drinking 4 (40 oz) sometimes sharing it with other people. He got disability secondary to having had 3 strokes.  He wants to go back to his home after he is detox. Says he has rent to pay, tow dogs to take cared of Mood Symptoms:  Depression,, mostly when he drinks Depression Symptoms:  depressed mood, fatigue, loss of energy/fatigue, decreased appetite, (Hypo) Manic Symptoms:  Denies Anxiety Symptoms:  Excessive Worry, Psychotic Symptoms:  Denies  PTSD Symptoms: Denies   Past Psychiatric History: Diagnosis: Alcohol Dependence, Alcohol withdrawal, substance induced mood disorder  Hospitalizations: Denies  Outpatient Care: Denies  Substance Abuse Care: [facility], [facility] (years ago, cant remember)  Self-Mutilation:Denies  Suicidal Attempts:Denies  Violent Behaviors:Denies   Past Medical History:   Past Medical History  Diagnosis Date  . Hypertension   . Hyperlipemia   . Emphysema   . Stroke   . Chronic pain   . Cirrhosis of liver   . Fall   . Dental injury   . Bone spur of other site     Left Foot  . Impaired mobility   . Total self care deficit   . Pneumonia   . Emphysema   . ETOH abuse   . Elevated LFTs     Allergies:   Allergies  Allergen Reactions  . Poison Ivy Extract (Extract Of Poison Ivy) Rash   PTA Medications: Prescriptions prior to admission  Medication Sig Dispense Refill  . acamprosate (CAMPRAL) 333 MG tablet Take 666 mg by mouth 3 (three) times daily.      Marland Kitchen albuterol (PROVENTIL HFA;VENTOLIN HFA) 108 (90 BASE) MCG/ACT inhaler Inhale 2 puffs into the lungs every 6 (six) hours as needed. Shortness of breath      . budesonide-formoterol (SYMBICORT) 80-4.5 MCG/ACT inhaler Inhale 2 puffs  into the lungs 2 (two) times daily. For COPD.      . ferrous sulfate 325 (65 FE) MG tablet Take 325 mg by mouth daily with breakfast. For low iron.      . folic acid (FOLVITE) 1 MG tablet Take 1 mg by mouth daily. For low folate.      Marland Kitchen guaiFENesin-dextromethorphan (ROBITUSSIN DM) 100-10 MG/5ML syrup Take 5 mLs by mouth 3 (three) times daily as needed.      Marland Kitchen HYDROcodone-acetaminophen (LORTAB) 7.5-500 MG per tablet Take 1 tablet by mouth every 6 (six) hours as needed. Pain      . nicotine (NICODERM CQ - DOSED IN MG/24 HR) 7 mg/24hr patch Place 1 patch onto the skin daily.      Marland Kitchen oxyCODONE-acetaminophen (PERCOCET) 5-325 MG per tablet Take 2 tablets by mouth every 4 (four) hours as needed for pain.  40 tablet  0  . pravastatin (PRAVACHOL) 40 MG tablet Take 80 mg by mouth daily. To lower your cholesterol.      . thiamine (VITAMIN B-1) 100 MG tablet Take 100 mg by mouth daily. For vitamin B deficiency.      . traMADol (ULTRAM) 50 MG tablet Take 50 mg by mouth 2 (two) times daily as needed. For pain      . traZODone (DESYREL) 50 MG tablet Take 50 mg by mouth at bedtime.      . vitamin B-12 (CYANOCOBALAMIN) 100 MCG tablet Take 100 mcg  by mouth daily. For low B12.      . vitamin E 200 UNIT capsule Take 200 Units by mouth daily.        Previous Psychotropic Medications:  Medication/Dose  Denies               Substance Abuse History in the last 12 months: Substance Age of 1st Use Last Use Amount Specific Type  Nicotine 16 Before he came to the ED Pack a day   Alcohol 10 Before he came to the ED Beer 40 oz up to 4 with other people (not specific)   Cannabis      Opiates      Cocaine      Methamphetamines      LSD      Ecstasy      Benzodiazepines      Caffeine      Inhalants      Others:                         Consequences of Substance Abuse: Legal Consequences:  DWI five, years ago  Social History: Current Place of Residence:  Has his own place, has a family member living with  him. Has two dogs Place of Birth:   Family Members: Marital Status:  Separated Children:  Sons: a 72 Y/O  Daughters: Relationships: Education:  HS Print production planner Problems/Performance: Religious Beliefs/Practices: History of Abuse (Emotional/Phsycial/Sexual) Occupational Experiences; Worked at a [employer], other jobs until he had the strokes Military History:  None. Legal History: Hobbies/Interests:  Family History:  History reviewed. No pertinent family history.  Mental Status Examination/Evaluation: Objective:  Appearance: Disheveled  Eye Contact::  Minimal  Speech:  Slow and dysarthria  Volume:  Decreased  Mood:  Depressed  Affect:  Restricted  Thought Process:  Goal Directed and Answers questions  Orientation:  Other:  Person, Place  Thought Content:  No spontaneous content  Suicidal Thoughts:  No  Homicidal Thoughts:  No  Memory:  Immediate;   Fair Recent;   Poor Remote;   Fair  Judgement:  Fair  Insight:  Shallow  Psychomotor Activity:  Decreased  Concentration:  Fair  Recall:  Poor  Akathisia:  No  Handed:  Right  AIMS (if indicated):     Assets:  Financial Resources/Insurance Housing  Sleep:       Laboratory/X-Ray Psychological Evaluation(s)      Assessment:    AXIS I:  Alcohol Dependence, Alcohol withdrawal, Substance Induced Mood Disorder AXIS II:  Deferred AXIS III:   Past Medical History  Diagnosis Date  . Hypertension   . Hyperlipemia   . Emphysema   . Stroke   . Chronic pain   . Cirrhosis of liver   . Fall   . Dental injury   . Bone spur of other site     Left Foot  . Impaired mobility   . Total self care deficit   . Pneumonia   . Emphysema   . ETOH abuse   . Elevated LFTs    AXIS IV:  problems with primary support group AXIS V:  51-60 moderate symptoms  Treatment Plan/Recommendations:  Treatment Plan Summary: Daily contact with patient to assess and evaluate symptoms and progress in treatment Medication  management Current Medications:  Current Facility-Administered Medications  Medication Dose Route Frequency Provider Last Rate Last Dose  . chlordiazePOXIDE (LIBRIUM) capsule 25 mg  25 mg Oral Q6H PRN Norval Gable, NP   25 mg  at 05/19/12 0703  . chlordiazePOXIDE (LIBRIUM) capsule 25 mg  25 mg Oral QID Norval Gable, NP   25 mg at 05/18/12 2200   Followed by  . chlordiazePOXIDE (LIBRIUM) capsule 25 mg  25 mg Oral TID Norval Gable, NP       Followed by  . chlordiazePOXIDE (LIBRIUM) capsule 25 mg  25 mg Oral BH-qamhs Norval Gable, NP       Followed by  . chlordiazePOXIDE (LIBRIUM) capsule 25 mg  25 mg Oral Daily Norval Gable, NP      . [COMPLETED] chlordiazePOXIDE (LIBRIUM) capsule 50 mg  50 mg Oral Once Norval Gable, NP   50 mg at 05/18/12 1734  . hydrOXYzine (ATARAX/VISTARIL) tablet 25 mg  25 mg Oral Q6H PRN Norval Gable, NP      . loperamide (IMODIUM) capsule 2-4 mg  2-4 mg Oral PRN Norval Gable, NP      . multivitamin with minerals tablet 1 tablet  1 tablet Oral Daily Norval Gable, NP   1 tablet at 05/18/12 1743  . ondansetron (ZOFRAN-ODT) disintegrating tablet 4 mg  4 mg Oral Q6H PRN Norval Gable, NP      . [COMPLETED] thiamine (B-1) injection 100 mg  100 mg Intramuscular Once Norval Gable, NP   100 mg at 05/18/12 1743  . thiamine (VITAMIN B-1) tablet 100 mg  100 mg Oral Daily Norval Gable, NP      . [DISCONTINUED] chlordiazePOXIDE (LIBRIUM) capsule 25 mg  25 mg Oral Q6H PRN Norval Gable, NP      . [DISCONTINUED] hydrOXYzine (ATARAX/VISTARIL) tablet 25 mg  25 mg Oral Q6H PRN Norval Gable, NP      . [DISCONTINUED] loperamide (IMODIUM) capsule 2-4 mg  2-4 mg Oral PRN Norval Gable, NP      . [DISCONTINUED] multivitamin with minerals tablet 1 tablet  1 tablet Oral Daily Norval Gable, NP      . [DISCONTINUED] ondansetron (ZOFRAN-ODT) disintegrating tablet 4 mg  4 mg Oral Q6H PRN Norval Gable, NP      . [DISCONTINUED] thiamine  (B-1) injection 100 mg  100 mg Intramuscular Once Norval Gable, NP      . [DISCONTINUED] thiamine (VITAMIN B-1) tablet 100 mg  100 mg Oral Daily Norval Gable, NP       Facility-Administered Medications Ordered in Other Encounters  Medication Dose Route Frequency Provider Last Rate Last Dose  . [DISCONTINUED] acamprosate (CAMPRAL) tablet 666 mg  666 mg Oral TID Gwyneth Sprout, MD   666 mg at 05/18/12 1254  . [DISCONTINUED] acetaminophen (TYLENOL) tablet 650 mg  650 mg Oral Q4H PRN Emilia Beck, PA-C   650 mg at 05/18/12 0023  . [DISCONTINUED] albuterol (PROVENTIL HFA;VENTOLIN HFA) 108 (90 BASE) MCG/ACT inhaler 2 puff  2 puff Inhalation Q6H PRN Gwyneth Sprout, MD      . [DISCONTINUED] alum & mag hydroxide-simeth (MAALOX/MYLANTA) 200-200-20 MG/5ML suspension 30 mL  30 mL Oral PRN Emilia Beck, PA-C      . [DISCONTINUED] budesonide-formoterol (SYMBICORT) 80-4.5 MCG/ACT inhaler 2 puff  2 puff Inhalation BID Gwyneth Sprout, MD   2 puff at 05/18/12 0811  . [DISCONTINUED] folic acid (FOLVITE) tablet 1 mg  1 mg Oral Daily Gwyneth Sprout, MD   1 mg at 05/18/12 1004  . [DISCONTINUED] haloperidol (HALDOL) tablet 5 mg  5 mg Oral BID Emilia Beck, PA-C   5 mg at 05/18/12 1003  . [DISCONTINUED] HYDROcodone-acetaminophen (NORCO/VICODIN) 5-325 MG per tablet 1 tablet  1 tablet Oral Q6H PRN Gwyneth Sprout, MD      . [  DISCONTINUED] ibuprofen (ADVIL,MOTRIN) tablet 600 mg  600 mg Oral Q8H PRN Emilia Beck, PA-C   600 mg at 05/18/12 0355  . [DISCONTINUED] LORazepam (ATIVAN) injection 1 mg  1 mg Intravenous Q6H PRN Gwyneth Sprout, MD   1 mg at 05/18/12 0355  . [DISCONTINUED] LORazepam (ATIVAN) tablet 1 mg  1 mg Oral Q8H PRN Emilia Beck, PA-C   1 mg at 05/18/12 1004  . [DISCONTINUED] LORazepam (ATIVAN) tablet 1 mg  1 mg Oral Q6H PRN Gwyneth Sprout, MD      . [DISCONTINUED] LORazepam (ATIVAN) tablet 1 mg  1 mg Oral Q8H PRN John-Adam Bonk, MD      . [DISCONTINUED] multivitamin  with minerals tablet 1 tablet  1 tablet Oral Daily Gwyneth Sprout, MD   1 tablet at 05/18/12 1004  . [DISCONTINUED] nicotine (NICODERM CQ - dosed in mg/24 hours) patch 21 mg  21 mg Transdermal Daily Kaitlyn Szekalski, PA-C   21 mg at 05/18/12 1004  . [DISCONTINUED] ondansetron (ZOFRAN) tablet 4 mg  4 mg Oral Q8H PRN Emilia Beck, PA-C      . [DISCONTINUED] simvastatin (ZOCOR) tablet 20 mg  20 mg Oral q1800 Gwyneth Sprout, MD      . [DISCONTINUED] thiamine (B-1) injection 100 mg  100 mg Intravenous Daily Gwyneth Sprout, MD      . [DISCONTINUED] thiamine (VITAMIN B-1) tablet 100 mg  100 mg Oral Daily Gwyneth Sprout, MD   100 mg at 05/18/12 1003  . [DISCONTINUED] traZODone (DESYREL) tablet 50 mg  50 mg Oral QHS Gwyneth Sprout, MD   50 mg at 05/17/12 2204  . [DISCONTINUED] zolpidem (AMBIEN) tablet 5 mg  5 mg Oral QHS PRN Emilia Beck, PA-C        Observation Level/Precautions:  Detox  Laboratory:  As per the ED  Psychotherapy:  Individual/Group/Relapse prvention  Medications:  Librium Detox Protocol   Routine PRN Medications:  Yes  Consultations:    Discharge Concerns:  Ability to abstain  Other:  Will reassess co morbidities as he gets detox   Kwana Ringel A 11/13/20138:18 AM

## 2012-05-19 NOTE — [facility] Suicide Risk Assessment (Signed)
Suicide Risk Assessment  Admission Assessment     Nursing information obtained from:  Patient Demographic factors:  Male;Low socioeconomic status;Unemployed Current Mental Status:    Loss Factors:  Decline in physical health;Financial problems / change in socioeconomic status Historical Factors:    Risk Reduction Factors:  Living with another person, especially a relative  CLINICAL FACTORS:   Alcohol/Substance Abuse/Dependencies  COGNITIVE FEATURES THAT CONTRIBUTE TO RISK: None    SUICIDE RISK:   Mild:  Suicidal ideation of limited frequency, intensity, duration, and specificity.  There are no identifiable plans, no associated intent, mild dysphoria and related symptoms, good self-control (both objective and subjective assessment), few other risk factors, and identifiable protective factors, including available and accessible social support.  PLAN OF CARE: Detox                              Supportive approach/coping skills/relapse prevention                              Reassess co morbidities   Peter [name] A 05/19/2012, 9:00 AM

## 2012-05-20 ENCOUNTER — Encounter (HOSPITAL_COMMUNITY): Payer: Self-pay | Admitting: *Deleted

## 2012-05-20 LAB — POCT I-STAT, CHEM 8
Calcium, Ion: 1.19 mmol/L (ref 1.12–1.23)
Creatinine, Ser: 1 mg/dL (ref 0.50–1.35)
Glucose, Bld: 100 mg/dL — ABNORMAL HIGH (ref 70–99)
HCT: 40 % (ref 39.0–52.0)
Hemoglobin: 13.6 g/dL (ref 13.0–17.0)
TCO2: 24 mmol/L (ref 0–100)

## 2012-05-20 MED ORDER — SODIUM CHLORIDE 0.9 % IV BOLUS (SEPSIS)
500.0000 mL | Freq: Once | INTRAVENOUS | Status: AC
  Administered 2012-05-20: 500 mL via INTRAVENOUS

## 2012-05-20 MED ORDER — SODIUM CHLORIDE 0.9 % IV BOLUS (SEPSIS)
1000.0000 mL | Freq: Once | INTRAVENOUS | Status: AC
  Administered 2012-05-20: 1000 mL via INTRAVENOUS

## 2012-05-20 NOTE — ED Notes (Signed)
Pt was here on 11/11 for detox and sent to [facility], sent back here for low blood pressure and heart rate increasing. Pt states he's dehydrated. Pt denies SI/HI, states I was at [facility] for detox. Sitter came w/ pt.

## 2012-05-20 NOTE — Progress Notes (Signed)
Patient evaluated by PA, order to transport top ED for evaluation entered. Patient educated to current plan of care and pending trip to ED, patient verbalizes understanding. Patient escorted by MHT, transported by [facility] security.

## 2012-05-20 NOTE — ED Provider Notes (Signed)
History     CSN: [id number]  Arrival date & time 05/20/12  1029   None     Chief Complaint  Patient presents with  . Dehydration  . Hypotension    (Consider location/radiation/quality/duration/timing/severity/associated sxs/prior treatment) Patient is a 61 y.o. male presenting with weakness. The history is provided by the patient and a caregiver.  Weakness Primary symptoms do not include dizziness, fever, nausea or vomiting.  Associated symptoms comments: The patient is currently receiving inpatient treatment for alcohol detox at [facility]. He was found to have a persistent high heartrate and now lower blood pressures than on admission and he was sent her for evaluation. The patient denies any symptoms currently. No dizziness, weakness, SOB, chest pain, melena, N, V. He reports he has been eating and drinking regularly. He denies palpitations. .    Past Medical History  Diagnosis Date  . Hypertension   . Hyperlipemia   . Emphysema   . Stroke   . Chronic pain   . Cirrhosis of liver   . Fall   . Dental injury   . Bone spur of other site     Left Foot  . Impaired mobility   . Total self care deficit   . Pneumonia   . Emphysema   . ETOH abuse   . Elevated LFTs     Past Surgical History  Procedure Date  . No past surgeries   . Colonoscopy 01/06/2012    Procedure: COLONOSCOPY;  Surgeon: Theda Belfast, MD;  Location: [facility] ENDOSCOPY;  Service: Endoscopy;  Laterality: N/A;  . Esophagogastroduodenoscopy 01/06/2012    Procedure: ESOPHAGOGASTRODUODENOSCOPY (EGD);  Surgeon: Theda Belfast, MD;  Location: [facility] ENDOSCOPY;  Service: Endoscopy;  Laterality: N/A;  . Esophagogastroduodenoscopy 01/09/2012    Procedure: ESOPHAGOGASTRODUODENOSCOPY (EGD);  Surgeon: Theda Belfast, MD;  Location: [facility] ENDOSCOPY;  Service: Endoscopy;  Laterality: N/A;  . Multiple extractions with alveoloplasty 03/29/2012    Procedure: MULTIPLE EXTRACION WITH ALVEOLOPLASTY;  Surgeon: Georgia Lopes, DDS;   Location: [facility] OR;  Service: Oral Surgery;  Laterality: Bilateral;    History reviewed. No pertinent family history.  History  Substance Use Topics  . Smoking status: Current Every Day Smoker -- 1.0 packs/day for 30 years    Types: Cigarettes  . Smokeless tobacco: Not on file  . Alcohol Use: 2.4 oz/week    4 Cans of beer per week     Comment: per pt four  40oz daily beer      Review of Systems  Constitutional: Negative for fever, chills and appetite change.  Eyes: Negative for visual disturbance.  Respiratory: Negative.  Negative for chest tightness and shortness of breath.   Cardiovascular: Negative.  Negative for chest pain.  Gastrointestinal: Negative.  Negative for nausea, vomiting, abdominal pain and blood in stool.  Genitourinary: Negative for dysuria.  Musculoskeletal: Negative.   Skin: Negative.  Negative for pallor.  Neurological: Negative.  Negative for dizziness.  Psychiatric/Behavioral: Negative for confusion.    Allergies  Poison ivy extract  Home Medications   Current Outpatient Rx  Name  Route  Sig  Dispense  Refill  . ACAMPROSATE CALCIUM 333 MG PO TBEC   Oral   Take 666 mg by mouth 3 (three) times daily.         . ALBUTEROL SULFATE HFA 108 (90 BASE) MCG/ACT IN AERS   Inhalation   Inhale 2 puffs into the lungs every 6 (six) hours as needed. Shortness of breath         .  BUDESONIDE-FORMOTEROL FUMARATE 80-4.5 MCG/ACT IN AERO   Inhalation   Inhale 2 puffs into the lungs 2 (two) times daily. For COPD.         Marland Kitchen FERROUS SULFATE 325 (65 FE) MG PO TABS   Oral   Take 325 mg by mouth daily with breakfast. For low iron.         Marland Kitchen FOLIC ACID 1 MG PO TABS   Oral   Take 1 mg by mouth daily. For low folate.         . GUAIFENESIN-DM 100-10 MG/5ML PO SYRP   Oral   Take 5 mLs by mouth 3 (three) times daily as needed.         Marland Kitchen HYDROCODONE-ACETAMINOPHEN 7.5-500 MG PO TABS   Oral   Take 1 tablet by mouth every 6 (six) hours as needed. Pain           . NICOTINE 7 MG/24HR TD PT24   Transdermal   Place 1 patch onto the skin daily.         . OXYCODONE-ACETAMINOPHEN 5-325 MG PO TABS   Oral   Take 2 tablets by mouth every 4 (four) hours as needed for pain.   40 tablet   0   . PRAVASTATIN SODIUM 40 MG PO TABS   Oral   Take 80 mg by mouth daily. To lower your cholesterol.         Marland Kitchen VITAMIN B-1 100 MG PO TABS   Oral   Take 100 mg by mouth daily. For vitamin B deficiency.         . TRAMADOL HCL 50 MG PO TABS   Oral   Take 50 mg by mouth 2 (two) times daily as needed. For pain         . TRAZODONE HCL 50 MG PO TABS   Oral   Take 50 mg by mouth at bedtime.         Marland Kitchen VITAMIN B-12 100 MCG PO TABS   Oral   Take 100 mcg by mouth daily. For low B12.         Marland Kitchen VITAMIN E 200 UNITS PO CAPS   Oral   Take 200 Units by mouth daily.           BP 90/64  Pulse 124  Temp 97.6 F (36.4 C) (Oral)  Resp 18  SpO2 100%  Physical Exam  Constitutional: He is oriented to person, place, and time. He appears well-developed and well-nourished. No distress.  HENT:  Head: Normocephalic.  Eyes: Conjunctivae normal are normal. Pupils are equal, round, and reactive to light.  Neck: Normal range of motion. Neck supple.  Cardiovascular: Regular rhythm.  Tachycardia present.   No murmur heard. Pulmonary/Chest: Effort normal and breath sounds normal.  Abdominal: Soft. Bowel sounds are normal. There is no tenderness. There is no rebound and no guarding.  Musculoskeletal: Normal range of motion. He exhibits no edema.  Neurological: He is alert and oriented to person, place, and time. Coordination normal.  Skin: Skin is warm and dry. No rash noted.  Psychiatric: He has a normal mood and affect.    ED Course  Procedures (including critical care time)  Labs Reviewed - No data to display No results found. Results for orders placed during the hospital encounter of 05/18/12  POCT I-STAT, CHEM 8      Component Value Range   Sodium  135  135 - 145 mEq/L   Potassium 3.6  3.5 - 5.1  mEq/L   Chloride 102  96 - 112 mEq/L   BUN 6  6 - 23 mg/dL   Creatinine, Ser 6.04  0.50 - 1.35 mg/dL   Glucose, Bld 540 (*) 70 - 99 mg/dL   Calcium, Ion 9.81  1.91 - 1.23 mmol/L   TCO2 24  0 - 100 mmol/L   Hemoglobin 13.6  13.0 - 17.0 g/dL   HCT 47.8  29.5 - 62.1 %     1. Alcohol dependence   2. Alcohol withdrawal       MDM  He is drinking fluids in ED, appears alert and comfortable, conversing with family members at bedside. He remains asymptomatic with improved vital signs. Feel he is stable to discharge and to return to [facility] to continue his treatment for alcohol detox.        Rodena Medin, PA-C 05/20/12 1442

## 2012-05-20 NOTE — ED Notes (Signed)
Pt ambulated to restroom and back to room.  Appears to be in NAD.

## 2012-05-20 NOTE — Progress Notes (Signed)
  D) Patient pleasant and cooperative upon my assessment. Patient states slept " well," and  appetite is " good." Patient rates depression as   8/10, patient rates hopeless feelings as  6/10. Patient denies SI/HI, denies A/V hallucinations.   A) Patient offered support and encouragement, patient encouraged to discuss feelings/concerns with staff. Patient verbalized understanding. Patient monitored Q15 minutes for safety. Patient met with MD to discuss today's goals and plan of care.  R) Patient isolates to room at times, attends some groups in day room and all meals in dining room.  Patient taking medications as ordered. Will continue to monitor.

## 2012-05-20 NOTE — ED Notes (Signed)
Bed:WLPT3<BR> Expected date:<BR> Expected time:<BR> Means of arrival:<BR> Comments:<BR>

## 2012-05-20 NOTE — Progress Notes (Signed)
[facility] MD Progress Note  05/20/2012 7:28 PM Offie Waide  MRN:  [id number]  Diagnosis:   Axis I: Alcohol dependence, Alcohol withdrawal,substrance induced mood disorder Axis II: Deferred Axis III:  Past Medical History  Diagnosis Date  . Hypertension   . Hyperlipemia   . Emphysema   . Stroke   . Chronic pain   . Cirrhosis of liver   . Fall   . Dental injury   . Bone spur of other site     Left Foot  . Impaired mobility   . Total self care deficit   . Pneumonia   . Emphysema   . ETOH abuse   . Elevated LFTs    Axis IV: other psychosocial or environmental problems Axis V: 51-60 moderate symptoms  ADL's:  Impaired  Sleep: Fair  Appetite:  Fair  Suicidal Ideation:  Plan:  Denies Intent:  Denies Means:  Denies Homicidal Ideation:  Plan:  denies Intent:  Denies Means:  Denies  Tyris exhibited some vital signs abnormalities. Sent to Ed, assessed to be dehydrated. He was hydrated and returned  Mental Status Examination/Evaluation: Objective:  Appearance: Disheveled  Patent attorney::  Fair  Speech:  dysarthric  Volume:  Decreased  Mood:  Anxious and Depressed  Affect:  Restricted  Thought Process:  Coherent  Orientation:  Other:  Person, Place  Thought Content:  No spontaneous content  Suicidal Thoughts:  No  Homicidal Thoughts:  No  Memory:  Immediate;   Fair Recent;   Poor Remote;   Fair  Judgement:  Impaired  Insight:  Shallow  Psychomotor Activity:  Decreased  Concentration:  Fair  Recall:  Poor  Akathisia:  No  Handed:  Right  AIMS (if indicated):     Assets:  Desire for Improvement Social Support  Sleep:  Number of Hours: 5.5    Vital Signs:Blood pressure 106/66, pulse 101, temperature 98 F (36.7 C), temperature source Oral, resp. rate 18, SpO2 100.00%. Current Medications: Current Facility-Administered Medications  Medication Dose Route Frequency Provider Last Rate Last Dose  . budesonide-formoterol (SYMBICORT) 80-4.5 MCG/ACT inhaler 2 puff  2  puff Inhalation BID Rachael Fee, MD   2 puff at 05/20/12 [phone]  . chlordiazePOXIDE (LIBRIUM) capsule 25 mg  25 mg Oral Q6H PRN Norval Gable, NP   25 mg at 05/20/12 1191  . [COMPLETED] chlordiazePOXIDE (LIBRIUM) capsule 25 mg  25 mg Oral QID Norval Gable, NP   25 mg at 05/19/12 2135   Followed by  . [COMPLETED] chlordiazePOXIDE (LIBRIUM) capsule 25 mg  25 mg Oral TID Norval Gable, NP   25 mg at 05/20/12 1708   Followed by  . chlordiazePOXIDE (LIBRIUM) capsule 25 mg  25 mg Oral BH-qamhs Norval Gable, NP       Followed by  . chlordiazePOXIDE (LIBRIUM) capsule 25 mg  25 mg Oral Daily Norval Gable, NP      . folic acid (FOLVITE) tablet 1 mg  1 mg Oral Daily Rachael Fee, MD   1 mg at 05/20/12 4782  . hydrOXYzine (ATARAX/VISTARIL) tablet 25 mg  25 mg Oral Q6H PRN Norval Gable, NP      . loperamide (IMODIUM) capsule 2-4 mg  2-4 mg Oral PRN Norval Gable, NP      . multivitamin with minerals tablet 1 tablet  1 tablet Oral Daily Norval Gable, NP   1 tablet at 05/20/12 9562  . ondansetron (ZOFRAN-ODT) disintegrating tablet 4 mg  4 mg Oral Q6H PRN Norval Gable, NP      .  simvastatin (ZOCOR) tablet 5 mg  5 mg Oral q1800 Rachael Fee, MD   5 mg at 05/20/12 1708  . [COMPLETED] sodium chloride 0.9 % bolus 1,000 mL  1,000 mL Intravenous Once Rachael Fee, MD   1,000 mL at 05/20/12 1207  . [COMPLETED] sodium chloride 0.9 % bolus 500 mL  500 mL Intravenous Once Rodena Medin, PA-C   500 mL at 05/20/12 1331  . thiamine (VITAMIN B-1) tablet 100 mg  100 mg Oral Daily Norval Gable, NP   100 mg at 05/20/12 1914  . traZODone (DESYREL) tablet 50 mg  50 mg Oral QHS Rachael Fee, MD   50 mg at 05/19/12 2135  . vitamin B-12 (CYANOCOBALAMIN) tablet 100 mcg  100 mcg Oral Daily Rachael Fee, MD   100 mcg at 05/20/12 7829    Lab Results:  Results for orders placed during the hospital encounter of 05/18/12 (from the past 48 hour(s))  POCT I-STAT, CHEM 8     Status: Abnormal    Collection Time   05/20/12 12:17 PM      Component Value Range Comment   Sodium 135  135 - 145 mEq/L    Potassium 3.6  3.5 - 5.1 mEq/L    Chloride 102  96 - 112 mEq/L    BUN 6  6 - 23 mg/dL    Creatinine, Ser 5.62  0.50 - 1.35 mg/dL    Glucose, Bld 130 (*) 70 - 99 mg/dL    Calcium, Ion 8.65  7.84 - 1.23 mmol/L    TCO2 24  0 - 100 mmol/L    Hemoglobin 13.6  13.0 - 17.0 g/dL    HCT 69.6  29.5 - 28.4 %     Physical Findings: AIMS: Facial and Oral Movements Muscles of Facial Expression: Minimal Lips and Perioral Area: None, normal Jaw: None, normal Tongue: None, normal,Extremity Movements Upper (arms, wrists, hands, fingers): None, normal Lower (legs, knees, ankles, toes): None, normal, Trunk Movements Neck, shoulders, hips: Minimal, Overall Severity Severity of abnormal movements (highest score from questions above): Minimal Incapacitation due to abnormal movements: Minimal Patient's awareness of abnormal movements (rate only patient's report): Aware, no distress, Dental Status Current problems with teeth and/or dentures?: Yes (teeth extracted this month, needs soft diet) Does patient usually wear dentures?: No  CIWA:  CIWA-Ar Total: 0  COWS:  COWS Total Score: 12   Treatment Plan Summary: Daily contact with patient to assess and evaluate symptoms and progress in treatment Medication management  Plan: Supportive approach/relapse prevention           Continue detox  Jarick Harkins A 05/20/2012, 7:28 PM

## 2012-05-20 NOTE — ED Notes (Signed)
MD at bedside. 

## 2012-05-21 DIAGNOSIS — F1020 Alcohol dependence, uncomplicated: Principal | ICD-10-CM

## 2012-05-21 MED ORDER — METRONIDAZOLE 500 MG PO TABS
2000.0000 mg | ORAL_TABLET | Freq: Once | ORAL | Status: AC
  Administered 2012-05-21: 2000 mg via ORAL
  Filled 2012-05-21 (×2): qty 4

## 2012-05-21 NOTE — Progress Notes (Signed)
1:1  D.  Pt sitting in dayroom attending group.  Received his [place] this evening, no complaints voiced.  Minimal interaction on unit.  Pt very unsteady on his feet and appears confused at times.  Denies SI/HI/hallucinations at this time.  A.  Support and encouragement offered.  1:1 continued as ordered for Pt safety.  R.  Pt safety maintained, will continue to monitor.

## 2012-05-21 NOTE — Progress Notes (Signed)
[facility] Group Notes:  (Counselor/Nursing/MHT/Case Management/Adjunct)  05/21/2012 11:01 AM  Type of Therapy:  Psychoeducational Skills  Participation Level:  Did Not Attend  Dalia Heading 05/21/2012, 11:01 AM

## 2012-05-21 NOTE — Progress Notes (Signed)
  D) Patient pleasant and cooperative upon my assessment. Patient mildly confused at times. Patient states slept "fair," and  appetite is "good." Patient rates depression as   1/10, patient rates hopeless feelings as  1/10. Patient denies SI/HI, denies A/V hallucinations.   A) Patient offered support and encouragement, patient encouraged to discuss feelings/concerns with staff. Patient verbalized understanding. Patient monitored Q15 minutes for safety. Patient met with MD to discuss today's goals and plan of care.  R) Patient active on unit, attending groups in day room and meals in dining room. Patient encouraged to use wheelchair and rise slowly to avoid falls, patient verbalizes understanding.  Patient taking medications as ordered. Will continue to monitor.

## 2012-05-21 NOTE — Progress Notes (Signed)
[facility] MD Progress Note  05/21/2012 4:21 PM Peter [name]  MRN:  [id number]  Diagnosis:  Alcohol dependence                     Hx of CVA  ADL's:  Impaired patient needs assistance  Sleep: Good  Appetite:  Fair  Suicidal Ideation:  "my only concern is to keep on living." Homicidal Ideation:  denies  AEB (as evidenced by): Subjective: Met with the patient 1:1 to discuss his symptoms and response to treatment.  He has returned from the ED where he was given 2 bags of fluids IV, and his vital signs have improved but he is now more confused. He reports a 'cat has been in his room and shut the door." Mental Status Examination/Evaluation: Objective:  Appearance: Disheveled  Eye Contact::  Minimal  Speech:  Slow  Volume:  Decreased  Mood:  Euthymic  Affect:  Constricted  Thought Process:  Disorganized  Orientation:  Full  Thought Content:  WDL  Suicidal Thoughts:  No  Homicidal Thoughts:  No  Memory:  Immediate;   Poor  Judgement:  Impaired  Insight:  Lacking  Psychomotor Activity:  Decreased   Concentration:  Poor  Recall:  Poor  Akathisia:  No  Handed:    AIMS (if indicated):     Assets:  Desire for Improvement  Sleep:  Number of Hours: 1.25    Vital Signs:Blood pressure 108/69, pulse 98, temperature 97.6 F (36.4 C), temperature source Oral, resp. rate 18, SpO2 100.00%. Current Medications: Current Facility-Administered Medications  Medication Dose Route Frequency Provider Last Rate Last Dose  . budesonide-formoterol (SYMBICORT) 80-4.5 MCG/ACT inhaler 2 puff  2 puff Inhalation BID Rachael Fee, MD   2 puff at 05/21/12 0757  . chlordiazePOXIDE (LIBRIUM) capsule 25 mg  25 mg Oral Q6H PRN Norval Gable, NP   25 mg at 05/20/12 4098  . [COMPLETED] chlordiazePOXIDE (LIBRIUM) capsule 25 mg  25 mg Oral TID Norval Gable, NP   25 mg at 05/20/12 1708   Followed by  . chlordiazePOXIDE (LIBRIUM) capsule 25 mg  25 mg Oral BH-qamhs Norval Gable, NP   25 mg at 05/21/12 0757     Followed by  . chlordiazePOXIDE (LIBRIUM) capsule 25 mg  25 mg Oral Daily Norval Gable, NP      . folic acid (FOLVITE) tablet 1 mg  1 mg Oral Daily Rachael Fee, MD   1 mg at 05/21/12 0756  . hydrOXYzine (ATARAX/VISTARIL) tablet 25 mg  25 mg Oral Q6H PRN Norval Gable, NP   25 mg at 05/21/12 0207  . loperamide (IMODIUM) capsule 2-4 mg  2-4 mg Oral PRN Norval Gable, NP      . multivitamin with minerals tablet 1 tablet  1 tablet Oral Daily Norval Gable, NP   1 tablet at 05/21/12 0756  . ondansetron (ZOFRAN-ODT) disintegrating tablet 4 mg  4 mg Oral Q6H PRN Norval Gable, NP      . simvastatin (ZOCOR) tablet 5 mg  5 mg Oral q1800 Rachael Fee, MD   5 mg at 05/20/12 1708  . thiamine (VITAMIN B-1) tablet 100 mg  100 mg Oral Daily Norval Gable, NP   100 mg at 05/21/12 0757  . traZODone (DESYREL) tablet 50 mg  50 mg Oral QHS Rachael Fee, MD   50 mg at 05/20/12 2157  . vitamin B-12 (CYANOCOBALAMIN) tablet 100 mcg  100 mcg Oral Daily Rachael Fee, MD   100 mcg  at 05/21/12 0756    Lab Results:  Results for orders placed during the hospital encounter of 05/18/12 (from the past 48 hour(s))  POCT I-STAT, CHEM 8     Status: Abnormal   Collection Time   05/20/12 12:17 PM      Component Value Range Comment   Sodium 135  135 - 145 mEq/L    Potassium 3.6  3.5 - 5.1 mEq/L    Chloride 102  96 - 112 mEq/L    BUN 6  6 - 23 mg/dL    Creatinine, Ser 8.29  0.50 - 1.35 mg/dL    Glucose, Bld 562 (*) 70 - 99 mg/dL    Calcium, Ion 1.30  8.65 - 1.23 mmol/L    TCO2 24  0 - 100 mmol/L    Hemoglobin 13.6  13.0 - 17.0 g/dL    HCT 78.4  69.6 - 29.5 %     Physical Findings: AIMS: Facial and Oral Movements Muscles of Facial Expression: Minimal Lips and Perioral Area: None, normal Jaw: None, normal Tongue: None, normal,Extremity Movements Upper (arms, wrists, hands, fingers): None, normal Lower (legs, knees, ankles, toes): None, normal, Trunk Movements Neck, shoulders, hips: Minimal,  Overall Severity Severity of abnormal movements (highest score from questions above): Minimal Incapacitation due to abnormal movements: Minimal Patient's awareness of abnormal movements (rate only patient's report): Aware, no distress, Dental Status Current problems with teeth and/or dentures?: Yes (teeth extracted this month, needs soft diet) Does patient usually wear dentures?: No  CIWA:  CIWA-Ar Total: 0  COWS:  COWS Total Score: 12   Treatment Plan Summary: Daily contact with patient to assess and evaluate symptoms and progress in treatment Medication management  Plan: 1. Due to his increasing confusion, IM consult has been called for further evaluation and possible transfer to main hospital for remainder of care. 2. No changes at this time.  He is a fall risk and a 1:1 will be placed.  Rona Ravens. Daleiza Bacchi PAC 05/21/2012, 4:21 PM

## 2012-05-21 NOTE — Clinical Social Work Note (Signed)
Aftercare Planning Group: 05/21/2012 9:45 AM  Pt attended discharge planning group and actively participated in group.  CSW provided pt with today's workbook.  Pt presents with calm mood and affect.  Pt denies depression, anxiety and SI/HI. Pt is scheduled to follow up at [facility] for [facility] services.  No further needs voiced by pt at this time.  Safety planning and suicide prevention discussed.  Pt participated in discussion and acknowledged an understanding of the information provided.         [facility] Group Note : Clinical Social Worker Group Therapy  05/21/2012  1:15 PM  Type of Therapy:  Group Therapy - Process Group  Participation Level:  Appropriate  Participation Quality:  Appropriate   Affect:  Appropriate  Cognitive:  Alert  Insight:  Limited  Engagement in Group:  Limited  Engagement in Therapy:  Limited  Modes of Intervention:  Clarification, Education, Problem-solving, Socialization and Support  Summary of Progress/Problems: The topic for today was feelings about relapse.  Pt got up and moved around the group multiple times before finally CSW had to ask pt to leave.    Hennesy Sobalvarro Horton, LCSWA 05/21/2012 3:00 pm

## 2012-05-21 NOTE — ED Provider Notes (Signed)
Medical screening examination/treatment/procedure(s) were performed by non-physician practitioner and as supervising physician I was immediately available for consultation/collaboration.  Archer Vise, MD 05/21/12 0732 

## 2012-05-21 NOTE — Progress Notes (Signed)
1:1 Observation initiated. 1:1 ordered for safety, patient high fall risk r/t increased confusion. MHT with patient. Patient verbalizes understanding of 1:1 precautions. Patient safe on unit. Will continue to monitor.

## 2012-05-21 NOTE — Consult Note (Signed)
[facility] Medical Consultation  Peter [name] ZOX:[id number] DOB: Apr 11, 1951 DOA: 05/18/2012 PCP: Cameron Sprang, FNP   Requesting physician: Verne Spurr, PA-C Date of consultation: 05/21/2012 Reason for consultation: confusion  Impression/Recommendations 1. Alcohol dependence--patient alert and oriented on exam but mildly confused. Vitals are stable, patient is cooperative and noted to be so by nursing staff. CBC, BMP essentially normal 11/14. Urinalysis 11/12 was notable incidentally for trichomonas 11/12. It does not appear that this was treated. Suggest 2 gm metronidazole x 1. He should be instructed to notify his partners so that they make seek treatment.  Suspect mild confusion related to current treatment for alcohol dependence, there is no evidence of significant alcohol withdrawal at this point. Recommend continued treatment for detox to prevent withdrawal.  I will followup again tomorrow. Please contact me if I can be of assistance in the meanwhile. Thank you for this consultation.  Brendia Sacks, MD [facility] Team 6 Pager [phone]  If 8PM-8AM, please contact night-coverage [email or web address] Password [email or web address]  Chief Complaint: none, "leave me alone"  HPI:  61 year old man presented voluntarily to ED 11/11 for detox from alcohol (alcohol level 436 in ED). Early 11/12 in ED was noted to be febrile 101, with HR 120. This was monitored. Patient was subsequently transferred to [facility] 11/12 PM for alcohol dependence, alcohol detox and started on Librium. While at [facility] was noted to be tachycardic at times and mostly using wheelchair rather than ambulating. Sent back to ED 11/14 for "low blood pressure and heart rate increasing". He was noted to be oriented x3 at that time and in NAD. He was treated by report with IVF and transferred back to [facility].    Today was noted to be mildly confused at times, but with calm mood and affect (LCSW), pleasant and cooperative Banker), safe on  unit Banker). I was asked to comment on confusion.  Patient denies complaints and wants to be left alone but participates in history and exam.   Review of Systems:  Negative for fever, visual changes, sore throat, rash, new muscle aches, chest pain, SOB, dysuria, bleeding, n/v/abdominal pain.  Past Medical History  Diagnosis Date  . Hypertension   . Hyperlipemia   . Emphysema   . Stroke   . Chronic pain   . Cirrhosis of liver   . Fall   . Dental injury   . Bone spur of other site     Left Foot  . Impaired mobility   . Total self care deficit   . Pneumonia   . Emphysema   . ETOH abuse   . Elevated LFTs    Past Surgical History  Procedure Date  . No past surgeries   . Colonoscopy 01/06/2012    Procedure: COLONOSCOPY;  Surgeon: Theda Belfast, MD;  Location: [facility] ENDOSCOPY;  Service: Endoscopy;  Laterality: N/A;  . Esophagogastroduodenoscopy 01/06/2012    Procedure: ESOPHAGOGASTRODUODENOSCOPY (EGD);  Surgeon: Theda Belfast, MD;  Location: [facility] ENDOSCOPY;  Service: Endoscopy;  Laterality: N/A;  . Esophagogastroduodenoscopy 01/09/2012    Procedure: ESOPHAGOGASTRODUODENOSCOPY (EGD);  Surgeon: Theda Belfast, MD;  Location: [facility] ENDOSCOPY;  Service: Endoscopy;  Laterality: N/A;  . Multiple extractions with alveoloplasty 03/29/2012    Procedure: MULTIPLE EXTRACION WITH ALVEOLOPLASTY;  Surgeon: Georgia Lopes, DDS;  Location: [facility] OR;  Service: Oral Surgery;  Laterality: Bilateral;   Social History:  reports that he has been smoking Cigarettes.  He has a 30 pack-year smoking history. He does not have any smokeless tobacco history on file. He  reports that he drinks about 2.4 ounces of alcohol per week. He reports that he does not use illicit drugs.  Allergies  Allergen Reactions  . Poison Ivy Extract (Extract Of Poison Ivy) Rash   History reviewed. No pertinent family history. Mother may have had diabetes.  Prior to Admission medications   Medication Sig Start Date End Date Taking? Authorizing  Provider  acamprosate (CAMPRAL) 333 MG tablet Take 666 mg by mouth 3 (three) times daily.   Yes Historical Provider, MD  albuterol (PROVENTIL HFA;VENTOLIN HFA) 108 (90 BASE) MCG/ACT inhaler Inhale 2 puffs into the lungs every 6 (six) hours as needed. Shortness of breath   Yes Historical Provider, MD  budesonide-formoterol (SYMBICORT) 80-4.5 MCG/ACT inhaler Inhale 2 puffs into the lungs 2 (two) times daily. For COPD. 02/04/12 02/03/13 Yes Verne Spurr, PA-C  ferrous sulfate 325 (65 FE) MG tablet Take 325 mg by mouth daily with breakfast. For low iron. 02/04/12  Yes Verne Spurr, PA-C  folic acid (FOLVITE) 1 MG tablet Take 1 mg by mouth daily. For low folate. 02/04/12  Yes Neil Mashburn, PA-C  guaiFENesin-dextromethorphan (ROBITUSSIN DM) 100-10 MG/5ML syrup Take 5 mLs by mouth 3 (three) times daily as needed.   Yes Historical Provider, MD  HYDROcodone-acetaminophen (LORTAB) 7.5-500 MG per tablet Take 1 tablet by mouth every 6 (six) hours as needed. Pain   Yes Historical Provider, MD  nicotine (NICODERM CQ - DOSED IN MG/24 HR) 7 mg/24hr patch Place 1 patch onto the skin daily.   Yes Historical Provider, MD  oxyCODONE-acetaminophen (PERCOCET) 5-325 MG per tablet Take 2 tablets by mouth every 4 (four) hours as needed for pain. 03/29/12  Yes Scott Thad Ranger, DDS  pravastatin (PRAVACHOL) 40 MG tablet Take 80 mg by mouth daily. To lower your cholesterol. 02/04/12  Yes Verne Spurr, PA-C  thiamine (VITAMIN B-1) 100 MG tablet Take 100 mg by mouth daily. For vitamin B deficiency. 02/04/12  Yes Verne Spurr, PA-C  traMADol (ULTRAM) 50 MG tablet Take 50 mg by mouth 2 (two) times daily as needed. For pain   Yes Historical Provider, MD  traZODone (DESYREL) 50 MG tablet Take 50 mg by mouth at bedtime.   Yes Historical Provider, MD  vitamin B-12 (CYANOCOBALAMIN) 100 MCG tablet Take 100 mcg by mouth daily. For low B12. 02/04/12  Yes Verne Spurr, PA-C  vitamin E 200 UNIT capsule Take 200 Units by mouth daily.   Yes  Historical Provider, MD   Physical Exam: Filed Vitals:   05/21/12 1145 05/21/12 1200 05/21/12 1700 05/21/12 1701  BP: 103/71 108/69 106/68 102/68  Pulse: 101 98 86 96  Temp:   98.3 F (36.8 C)   TempSrc:      Resp: 18  16   SpO2:        General:  Appears calm and comfortable, no agitation. Sits on side of bed, cooperates with exam and questions. Non-toxic. Eyes: PERRL, arcus senilis, normal lids, irises  ENT: grossly normal hearing, lips  Neck: no LAD, masses or thyromegaly Cardiovascular: RRR, no m/r/g. No LE edema. Respiratory: CTA bilaterally, no w/r/r. Normal respiratory effort. Abdomen: soft, ntnd Musculoskeletal: grossly normal tone BUE/BLE Psychiatric: appears depressed. Alert, oriented to self, November, 2013, Obama, not hospital Speech fluent and clear, mostly appropriate but sometimes requires redirection and sometimes seems to be confused on current hospitalization. Neurologic: grossly non-focal.  Labs on Admission:  Basic Metabolic Panel:  Lab 05/20/12 1610 05/17/12 1245  NA 135 135  K 3.6 4.3  CL 102 97  CO2 -- 22  GLUCOSE 100* 95  BUN 6 7  CREATININE 1.00 0.76  CALCIUM -- 9.1  MG -- --  PHOS -- --   Liver Function Tests:  Lab 05/17/12 1245  AST 106*  ALT 46  ALKPHOS 127*  BILITOT 0.2*  PROT 8.9*  ALBUMIN 3.8   CBC:  Lab 05/20/12 1217 05/17/12 1245  WBC -- 4.6  NEUTROABS -- --  HGB 13.6 11.0*  HCT 40.0 32.1*  MCV -- 77.3*  PLT -- 222     Principal Problem:  *Alcohol dependence Active Problems:  Alcohol withdrawal  Substance induced mood disorder    Time spent: 45 minutes  Brendia Sacks, MD [facility] Team 6 Pager [phone]  If 8PM-8AM, please contact night-coverage [email or web address] Password [email or web address] 05/21/2012, 6:01 PM

## 2012-05-21 NOTE — Progress Notes (Signed)
Found pt awake on rounds. Trash can placed in front of door. Pt states, "I didn't have a key to lock the door." Reminded pt staff would be checking on patient every 15 mins. Given reassurance about safety. Pt verbalized understanding though affect remained somewhat confused. He denies any other problems or complaints. Peter [name]

## 2012-05-21 NOTE — Tx Team (Signed)
Interdisciplinary Treatment Plan Update (Adult)  Date:  05/21/2012  Time Reviewed:  10:46 AM   Progress in Treatment: Attending groups: Yes Participating in groups:  Yes Taking medication as prescribed: Yes Tolerating medication:  Yes Family/Significant othe contact made:  Yes Patient understands diagnosis:  Yes Discussing patient identified problems/goals with staff:  Yes Medical problems stabilized or resolved:  Yes Denies suicidal/homicidal ideation: Yes Issues/concerns per patient self-inventory:  None identified Other: N/A  New problem(s) identified: None Identified  Reason for Continuation of Hospitalization: Medical Issues Medication stabilization  Interventions implemented related to continuation of hospitalization: mood stabilization, medication monitoring and adjustment, group therapy and psycho education, suicide risk assessment, collateral contact, aftercare planning, ongoing physician assessments and safety checks q 15 mins  Additional comments: N/A  Estimated length of stay: 3 days  Discharge Plan: CSW secured follow up for pt at [facility] for outpatient services.   New goal(s): N/A  Review of initial/current patient goals per problem list:    1.  Goal(s): Address substance use by completing detox protocol  Met:  Yes  Target date: 10/16  As evidenced by: completes librium detox protocol on this date.   2.  Goal (s): Reduce depressive symptoms from a 10 to a 3  Met:  Yes  Target date: 10/15  As evidenced by: Pt denies depression.    3.  Goal (s): Reduce anxiety symptoms from a 10 to a 3  Met:  Yes  Target date:  10/15  As evidenced by: Pt denies anxiety.     Attendees: Patient:     Family:     Physician: Geoffery Lyons, MD 05/21/2012 10:46 AM   Nursing: Carroll Sage, RN 05/21/2012 10:46 AM   Clinical Social Worker:  Reyes Ivan, LCSWA 05/21/2012  10:46 AM   Other:  05/21/2012  10:46 AM   Other:     Other:     Other:       Other:      Scribe for Treatment Team:   Reyes Ivan 05/21/2012 10:46 AM

## 2012-05-21 NOTE — Progress Notes (Signed)
Pt up at nurses' station at 0200 asking to leave. "I just need to go home and smoke some cigarettes. I'll be back by the morning when the doctor is here. Just let me out for a bit." Pt oriented to person and place only. Pt not using assistive devices for ambulation. Support and reassurance offered. Explained to pt he would need to speak with team in the morning. Verified nicotine patch in place. Vistaril 25mg  prn given for anxiety. Reviewed fall precautions with pt and instructed him to use equipment at bedside. On reassess pt is asleep. Will continue to monitor closely.

## 2012-05-21 NOTE — Progress Notes (Signed)
Psychoeducational Group Note  Date:  05/21/2012 Time:  2000  Group Topic/Focus:  AA group  Participation Level:  Active  Participation Quality:  Appropriate  Affect:  Appropriate  Cognitive:  Alert  Insight:  Good  Engagement in Group:  Good  Additional Comments:    Neville Walston R 05/21/2012, 10:04 PM

## 2012-05-22 MED ORDER — NICOTINE 21 MG/24HR TD PT24
21.0000 mg | MEDICATED_PATCH | Freq: Every day | TRANSDERMAL | Status: DC
  Administered 2012-05-22 – 2012-05-25 (×4): 21 mg via TRANSDERMAL
  Filled 2012-05-22 (×7): qty 1

## 2012-05-22 NOTE — Progress Notes (Signed)
D - Patient attended group this am and participated appropriately. Patient rates depression 10/10 with 10 being the worse and hopelessness 8/10 with 10 being the worse. Patient denies SI/HI and any auditory or visual hallucinations. Patient denies any pain or discomfort this am. A - Offered encouragement and support through therapeutic conversation. Encouraged patient to talk with staff about any concerns or questions.  R - Patient safety maintained with continued 1:1.

## 2012-05-22 NOTE — Progress Notes (Signed)
Psychoeducational Group Note  Date:  05/22/2012 Time:  1515  Group Topic/Focus:  Healthy Communication:   The focus of this group is to discuss communication, barriers to communication, as well as healthy ways to communicate with others.  Participation Level:  Minimal  Participation Quality:  Appropriate  Affect:  Appropriate  Cognitive:  Appropriate  Insight:  Good  Engagement in Group:  Good  Additional Comments:  Patient was appropriate in group. Patient was attentive and even shared a few examples of healthy communication. Patient's speech was difficult to understand at times.  Lyndee Hensen 05/22/2012, 8:55 PM

## 2012-05-22 NOTE — Clinical Social Work Note (Signed)
[facility] Group Notes:  (Clinical Social Work)  05/22/2012  10:00-11:00AM  Summary of Progress/Problems:   The main focus of today's process group was for the patient to identify ways in which they have in the past sabotaged their own recovery and reasons they may have done this/what they received from doing it.  We then worked to identify a specific plan to avoid doing this when discharged from the hospital for this admission.  The patient expressed that he has sabotaged his health by drinking before going to his doctor, and so he got sent to [facility].  He said that if he drinks again, his doctor has said he will discharge him from the practice.  A potential immediate means of self-sabotage is that he has cigarettes in his locker and for him smoking and drinking go together.  He will think about giving away his cigarettes or having the nurse to throw them away at the time of discharge.  Type of Therapy:  Group Therapy - Process  Participation Level:  Active  Participation Quality:  Redirectable and Sharing  Affect:  Blunted  Cognitive:  Confused  Insight:  Limited  Engagement in Group:  Good  Engagement in Therapy:  Good  Modes of Intervention:  Clarification, Education, Limit-setting, Problem-solving, Socialization, Support and Processing   Ambrose Mantle, LCSW 05/22/2012, 12:07 PM

## 2012-05-22 NOTE — Progress Notes (Signed)
[facility] MD Progress Note  05/22/2012 4:39 PM Peter [name]  MRN:  [id number]  S: "[name] have tremors from drinking too much. [name] have been drinking alcohol since [name] was 61 years old. My balance is off, but [name] am doing fine. [name] sleep well and [name] am eating well".  O: Negative for fever.  HENT: Negative for congestion and rhinorrhea.  Respiratory: Negative for cough, chest tightness and shortness of breath.  Cardiovascular: Negative for chest pain.  Gastrointestinal: Negative for nausea, vomiting and abdominal pain.  Skin: Negative for rash.  Neurological: Positive for weakness and no headaches. Unstable gait and balance present. Ambulates with a cane.  Diagnosis:   Axis [name]: Substance Induced Mood Disorder and Alcohol dependence Axis II: Deferred Axis III:  Past Medical History  Diagnosis Date  . Hypertension   . Hyperlipemia   . Emphysema   . Stroke   . Chronic pain   . Cirrhosis of liver   . Fall   . Dental injury   . Bone spur of other site     Left Foot  . Impaired mobility   . Total self care deficit   . Pneumonia   . Emphysema   . ETOH abuse   . Elevated LFTs    Axis IV: Chronic Alcoholism Axis V: 51-60 moderate symptoms  ADL's:  Intact  Sleep: Good  Appetite:  Good  Suicidal Ideation: "No" Plan:  No Intent:  No Means:  No Homicidal Ideation: "No" Plan:  No Intent:  No Means:  No  AEB (as evidenced by): per patient's reports  Mental Status Examination/Evaluation: Objective:  Appearance: Casual and walks with a cane.  Eye Contact::  Good  Speech:  Sluggish  Volume:  Normal  Mood:  "[name]'m doing fine"  Affect:  Appropriate  Thought Process:  Coherent  Orientation:  Full  Thought Content:  Rumination  Suicidal Thoughts:  No  Homicidal Thoughts:  No  Memory:  Immediate;   Good Recent;   Good Remote;   Good  Judgement:  Fair  Insight:  Fair  Psychomotor Activity:  Shuffling Gait and Tremor  Concentration:  Good  Recall:  Fair  Akathisia:  No  Handed:  Right   AIMS (if indicated):     Assets:  Desire for Improvement  Sleep:  Number of Hours: 3    Vital Signs:Blood pressure 101/66, pulse 133, temperature 97.2 F (36.2 C), temperature source Oral, resp. rate 16, SpO2 100.00%. Current Medications: Current Facility-Administered Medications  Medication Dose Route Frequency Provider Last Rate Last Dose  . budesonide-formoterol (SYMBICORT) 80-4.5 MCG/ACT inhaler 2 puff  2 puff Inhalation BID Rachael Fee, MD   2 puff at 05/22/12 [phone]  . [EXPIRED] chlordiazePOXIDE (LIBRIUM) capsule 25 mg  25 mg Oral Q6H PRN Norval Gable, NP   25 mg at 05/20/12 1191  . [COMPLETED] chlordiazePOXIDE (LIBRIUM) capsule 25 mg  25 mg Oral BH-qamhs Norval Gable, NP   25 mg at 05/21/12 2118   Followed by  . [COMPLETED] chlordiazePOXIDE (LIBRIUM) capsule 25 mg  25 mg Oral Daily Norval Gable, NP   25 mg at 05/22/12 0808  . folic acid (FOLVITE) tablet 1 mg  1 mg Oral Daily Rachael Fee, MD   1 mg at 05/22/12 4782  . [EXPIRED] hydrOXYzine (ATARAX/VISTARIL) tablet 25 mg  25 mg Oral Q6H PRN Norval Gable, NP   25 mg at 05/21/12 0207  . [EXPIRED] loperamide (IMODIUM) capsule 2-4 mg  2-4 mg Oral PRN Norval Gable, NP      . [  COMPLETED] metroNIDAZOLE (FLAGYL) tablet 2,000 mg  2,000 mg Oral Once Standley Brooking, MD   2,000 mg at 05/21/12 1957  . multivitamin with minerals tablet 1 tablet  1 tablet Oral Daily Norval Gable, NP   1 tablet at 05/22/12 [phone]  . nicotine (NICODERM CQ - dosed in mg/24 hours) patch 21 mg  21 mg Transdermal Q0600 Rachael Fee, MD   21 mg at 05/22/12 5621  . [EXPIRED] ondansetron (ZOFRAN-ODT) disintegrating tablet 4 mg  4 mg Oral Q6H PRN Norval Gable, NP      . simvastatin (ZOCOR) tablet 5 mg  5 mg Oral q1800 Rachael Fee, MD   5 mg at 05/21/12 1712  . thiamine (VITAMIN B-1) tablet 100 mg  100 mg Oral Daily Norval Gable, NP   100 mg at 05/22/12 0808  . traZODone (DESYREL) tablet 50 mg  50 mg Oral QHS Rachael Fee, MD   50 mg at  05/21/12 2118  . vitamin B-12 (CYANOCOBALAMIN) tablet 100 mcg  100 mcg Oral Daily Rachael Fee, MD   100 mcg at 05/22/12 3086    Lab Results: No results found for this or any previous visit (from the past 48 hour(s)).  Physical Findings: AIMS: Facial and Oral Movements Muscles of Facial Expression: Minimal Lips and Perioral Area: None, normal Jaw: None, normal Tongue: None, normal,Extremity Movements Upper (arms, wrists, hands, fingers): None, normal Lower (legs, knees, ankles, toes): None, normal, Trunk Movements Neck, shoulders, hips: Minimal, Overall Severity Severity of abnormal movements (highest score from questions above): Minimal Incapacitation due to abnormal movements: Minimal Patient's awareness of abnormal movements (rate only patient's report): Aware, no distress, Dental Status Current problems with teeth and/or dentures?: Yes (teeth extracted this month, needs soft diet) Does patient usually wear dentures?: No  CIWA:  CIWA-Ar Total: 2  COWS:  COWS Total Score: 12   Treatment Plan Summary: Daily contact with patient to assess and evaluate symptoms and progress in treatment Medication management  Plan: Continue 1:1 supervision. No changes made on the current treatment regimen.  Continue current treatment plan.  Peter [name] 05/22/2012, 4:39 PM

## 2012-05-22 NOTE — Consult Note (Signed)
By review of staff notes patient has done well today, though somewhat confused, has been noted to be "active, appropriate, interacting with peers appropriately, pleasant and cooperative", participated in group discussions. No hallucinations.  S: "better"  Appears calm and comfortable, speech fluent and clear.  Confusion appears very mild and related to underlying alcohol dependence and detox. He is participating with treatment. Recommend continuing detox as per psychiatry. No further recommendations. Will sign off.  Brendia Sacks, MD [facility] [phone]

## 2012-05-22 NOTE — Progress Notes (Signed)
Patient did attend the evening speaker AA meeting.  

## 2012-05-22 NOTE — Progress Notes (Signed)
See 1:1 notes on flowsheet

## 2012-05-22 NOTE — Progress Notes (Signed)
Psychoeducational Group Note  Date:  05/22/2012 Time:  1315  Group Topic/Focus:  Building Self Esteem:   The Focus of this group is helping patients become aware of the effects of self-esteem on their lives, the things they and others do that enhance or undermine their self-esteem, seeing the relationship between their level of self-esteem and the choices they make and learning ways to enhance self-esteem.  Participation Level:  Active  Participation Quality:  Appropriate  Affect:  Flat  Cognitive:  Alert and Confused  Insight:  Limited  Engagement in Group:  Good  Additional Comments:  Pt participated in group appropriately  Sandria Senter 05/22/2012, 2:08 PM

## 2012-05-22 NOTE — Progress Notes (Signed)
Psychoeducational Group Note  Date:  05/22/2012 Time:  0930  Group Topic/Focus:  Recovery Goals:   The focus of this group is to identify appropriate goals for recovery and establish a plan to achieve them.  Participation Level:  Did not attend  Participation Quality:  Did not attend  Affect:  Did not attend  Cognitive:  Did not attned  Insight:  Did not attend  Engagement in Group:  Did not attned  Additional Comments:  Did not attend group  Roselee Culver 05/22/2012, 11:31 AM

## 2012-05-22 NOTE — Progress Notes (Signed)
Patient sitting in dayroom with 1:1 sitter, interacting with peers appropriately. Patient pleasant and cooperative, no signs of distress noted. 1:1 continued to maintained patient safety.

## 2012-05-23 NOTE — Progress Notes (Signed)
See 1:1 notes.

## 2012-05-23 NOTE — Clinical Social Work Note (Signed)
[facility] Group Notes:  (Clinical Social Work)  05/23/2012  10:00-11:00AM  Summary of Progress/Problems:   The main focus of today's process group was for the patient to define "support" and describe what healthy supports are, then to identify the patient's current support system and decide on other supports that can be put in place to prevent future hospitalizations.   An emphasis was placed on using therapist, doctor and problem-specific support groups to expand supports.  The patient expressed that this hospital, his mother, and his 3 sisters are his main supports.  He plans to go to [employer] and therapy groups at [employer] [facility] [place].  Type of Therapy:  Group Therapy  Participation Level:  Active  Participation Quality:  Appropriate, Attentive, Sharing and Supportive  Affect:  Flat  Cognitive:  Alert, Appropriate and Oriented  Insight:  Good  Engagement in Group:  Good  Engagement in Therapy:  Good  Modes of Intervention:  Clarification, Education, Limit-setting, Problem-solving, Socialization, Support and Processing   Ambrose Mantle, LCSW 05/23/2012, 12:14 PM

## 2012-05-23 NOTE — Progress Notes (Signed)
Patient did attend the evening speaker AA meeting.  

## 2012-05-23 NOTE — Progress Notes (Signed)
[facility] MD Progress Note  05/23/2012 11:20 AM Peter [name]  MRN:  [id number]  Diagnosis:  Alcohol dependence   ADL's:  Intact  Sleep: Good  Appetite:  Good  Suicidal Ideation:  denies Homicidal Ideation:  denies  AEB (as evidenced by): Subjective: Met with the patient 1:1 today to discuss his response to treatment. He is much more alert today, and states he feels much better. He states he wants to go home tomorrow.  He denies new problems. Mental Status Examination/Evaluation: Objective:  Appearance: Casual  Eye Contact::  Fair  Speech:  clearer today  Volume:  Normal  Mood:  Euthymic  Affect:  Congruent  Thought Process:  Coherent  Orientation:  Full  Thought Content:  WDL  Suicidal Thoughts:  No  Homicidal Thoughts:  No  Memory:  Immediate;   Fair  Judgement:  Intact  Insight:  Fair  Psychomotor Activity:  Decreased  Concentration:  Fair  Recall:  Fair  Akathisia:  No  Handed:  Right  AIMS (if indicated):     Assets:  Housing Resilience  Sleep:  Number of Hours: 3.75    Vital Signs:Blood pressure 96/61, pulse 108, temperature 97.3 F (36.3 C), temperature source Oral, resp. rate 20, SpO2 100.00%. Current Medications: Current Facility-Administered Medications  Medication Dose Route Frequency Provider Last Rate Last Dose  . budesonide-formoterol (SYMBICORT) 80-4.5 MCG/ACT inhaler 2 puff  2 puff Inhalation BID Rachael Fee, MD   2 puff at 05/23/12 0800  . folic acid (FOLVITE) tablet 1 mg  1 mg Oral Daily Rachael Fee, MD   1 mg at 05/23/12 0759  . multivitamin with minerals tablet 1 tablet  1 tablet Oral Daily Norval Gable, NP   1 tablet at 05/23/12 0800  . nicotine (NICODERM CQ - dosed in mg/24 hours) patch 21 mg  21 mg Transdermal Q0600 Rachael Fee, MD   21 mg at 05/23/12 0507  . simvastatin (ZOCOR) tablet 5 mg  5 mg Oral q1800 Rachael Fee, MD   5 mg at 05/22/12 1711  . thiamine (VITAMIN B-1) tablet 100 mg  100 mg Oral Daily Norval Gable, NP   100 mg  at 05/23/12 0759  . traZODone (DESYREL) tablet 50 mg  50 mg Oral QHS Rachael Fee, MD   50 mg at 05/22/12 2149  . vitamin B-12 (CYANOCOBALAMIN) tablet 100 mcg  100 mcg Oral Daily Rachael Fee, MD   100 mcg at 05/23/12 7829    Lab Results: No results found for this or any previous visit (from the past 48 hour(s)).  Physical Findings: AIMS: Facial and Oral Movements Muscles of Facial Expression: Minimal Lips and Perioral Area: None, normal Jaw: None, normal Tongue: None, normal,Extremity Movements Upper (arms, wrists, hands, fingers): None, normal Lower (legs, knees, ankles, toes): None, normal, Trunk Movements Neck, shoulders, hips: Minimal, Overall Severity Severity of abnormal movements (highest score from questions above): Minimal Incapacitation due to abnormal movements: Minimal Patient's awareness of abnormal movements (rate only patient's report): Aware, no distress, Dental Status Current problems with teeth and/or dentures?: Yes (teeth extracted this month, needs soft diet) Does patient usually wear dentures?: No  CIWA:  CIWA-Ar Total: 3  COWS:  COWS Total Score: 12   Treatment Plan Summary: Daily contact with patient to assess and evaluate symptoms and progress in treatment Medication management  Plan: 1. Continue his current plan of care. He will likely discharge home tomorrow so medications will be reconciled to prepare for this. 2. Will continue  the 1:1 due to fall risk. 3. Will follow. 4. Could be a candidate for home health nurse given his history of CVA. Rona Ravens. Cora Brierley PAC 05/23/2012, 11:20 AM

## 2012-05-23 NOTE — Progress Notes (Signed)
D) Pt just returning from recreation therapy, pt pleasant upon approach, makes general conversation and does not verbalize any complaints at this time A) Patient offered support and encouragement,  R) Patient active on unit, attending groups in day room and meals in dining room. Will continue to monitor.

## 2012-05-23 NOTE — Progress Notes (Signed)
See 1:1 notes on MHT flowsheet 

## 2012-05-23 NOTE — Progress Notes (Signed)
Psychoeducational Group Note  Date:  05/23/2012 Time:  1000am  Group Topic/Focus:  Making Healthy Choices:   The focus of this group is to help patients identify negative/unhealthy choices they were using prior to admission and identify positive/healthier coping strategies to replace them upon discharge.  Participation Level:  None  Participation Quality:  Inattentive  Affect:  Blunted  Cognitive:  Confused  Insight:  None  Engagement in Group:  None  Additional Comments:    Valente David 05/23/2012,2:31 PM

## 2012-05-23 NOTE — Progress Notes (Signed)
Psychoeducational Group Note  Date:  05/23/2012 Time:  1515  Group Topic/Focus:  Making Healthy Choices:   The focus of this group is to help patients identify negative/unhealthy choices they were using prior to admission and identify positive/healthier coping strategies to replace them upon discharge.  Participation Level:  Active  Participation Quality:  Appropriate  Affect:  Appropriate  Cognitive:  Appropriate  Insight:  Good  Engagement in Group:  Good  Additional Comments:    Arhianna Ebey H 05/23/2012, 7:20 PM

## 2012-05-24 MED ORDER — IBUPROFEN 200 MG PO TABS
ORAL_TABLET | ORAL | Status: AC
  Filled 2012-05-24: qty 2

## 2012-05-24 MED ORDER — IBUPROFEN 200 MG PO TABS
400.0000 mg | ORAL_TABLET | Freq: Four times a day (QID) | ORAL | Status: DC | PRN
  Administered 2012-05-24: 400 mg via ORAL

## 2012-05-24 NOTE — Progress Notes (Signed)
Staff explained to the patient that this group is designed to assist in identifying activities that they will incorporate into their daily living with the intention of improving or restoring emotional, physical, spiritual, interpersonal and financial health. The patients were asked to identify one area or skill where they are taking care of themselves. Patients will identify 2-3 activities of self- care that they will use in their daily living after discharge. Patients were encouraged to utilize the skills and techniques taught and apply it in their daily routine.

## 2012-05-24 NOTE — Progress Notes (Signed)
(  D) Patient complaints of lower back pain which he associates with "getting old and using muscles I haven't used in a while"; rates pain at 4. (A) Ibuprofen order received from NP. (R) Administered. Joice Lofts RN MS EdS 05/24/2012  4:02 PM

## 2012-05-24 NOTE — Clinical Social Work Note (Signed)
Aftercare Planning Group: 05/24/2012 9:45 AM  Pt attended discharge planning group and actively participated in group.  CSW provided pt with today's workbook.  Pt presents with calm mood and affect.  Pt denies having depression, anxiety and SI/HI.  Pt has follow up scheduled at [facility] for outpatient services.  CSW spoke with pt's mother who states that pt is fine to come home tomorrow and she will pick pt tomorrow but states it has to be at 12:00 so she can get to her appointment.  No further needs voiced by pt at this time.    [facility] Group Note : Clinical Social Worker Group Therapy  05/24/2012  1:15 PM  Type of Therapy:  Group Therapy - Process Group  Participation Level:  Appropriate  Participation Quality:  Appropriate   Affect:  Appropriate  Cognitive:  Alert  Insight:  Good  Engagement in Group:  Good  Engagement in Therapy:  Good  Modes of Intervention:  Clarification, Education, Problem-solving, Socialization and Support  Summary of Progress/Problems: The topic for group today was overcoming obstacles.  Pt discussed overcoming obstacles and what this means for pt. Pt discussed overcoming his drinking by quitting "cold [place]".  Pt was insightful today and actively participated in group discussion.     Webber Michiels Horton, LCSWA 05/24/2012 3:00 pm

## 2012-05-24 NOTE — Progress Notes (Signed)
D:  Zacchaeus states that he slept well and that his energy level is normal.  He reports that he is feeling good and wants to go home.  He rates depression and hopelessness at 1/10.  He denies SI/HI/AVH at this time.  1:1 was discontinued because patient is less confused at this time.  He is performing ADL's as he did prior to admission. A:  Medications given as ordered.  Safety checks q 15 minutes. Emotional support provided. R:  Safety maintained on unit.

## 2012-05-24 NOTE — Tx Team (Addendum)
Interdisciplinary Treatment Plan Update (Adult)  Date:  05/24/2012  Time Reviewed:  9:54 AM   Progress in Treatment: Attending groups: Yes Participating in groups:  Yes Taking medication as prescribed: Yes Tolerating medication:  Yes Family/Significant othe contact made:  Yes Patient understands diagnosis:  Yes Discussing patient identified problems/goals with staff:  Yes Medical problems stabilized or resolved:  Yes Denies suicidal/homicidal ideation: Yes Issues/concerns per patient self-inventory:  None identified Other: N/A  New problem(s) identified: None Identified  Reason for Continuation of Hospitalization: Anxiety Depression Medication stabilization Withdrawal symptoms  Interventions implemented related to continuation of hospitalization: mood stabilization, medication monitoring and adjustment, group therapy and psycho education, suicide risk assessment, collateral contact, aftercare planning, ongoing physician assessments and safety checks q 15 mins  Additional comments: Will d/c 1:1 today to prepare pt for d/c tomorrow.  Estimated length of stay: 1 day  Discharge Plan: Pt will follow up at [facility] for outpatient services.    New goal(s): N/A  Review of initial/current patient goals per problem list:     All goals met   Attendees: Patient:     Family:     Physician: Geoffery Lyons, MD 05/24/2012 9:54 AM   Nursing: Roswell Miners, RN 05/24/2012 9:54 AM   Clinical Social Worker:  Reyes Ivan, LCSWA 05/24/2012  9:54 AM   Other: Chinita Greenland, RN 05/24/2012  9:54 AM   Other:  Alease Frame, RN 05/24/2012 9:55 AM   Other:  Bubba Camp, Psyc intern 05/24/2012 9:55 AM   Other:     Other:      Scribe for Treatment Team:   Reyes Ivan 05/24/2012 9:54 AM

## 2012-05-24 NOTE — Progress Notes (Signed)
[facility] MD Progress Note  05/24/2012 4:21 PM Martine Trageser  MRN:  [id number]  Diagnosis:  Alcohol dependence, alcohol withdrawal, Substance induced mood diosrder  ADL's:  Intact  Sleep: Fair  Appetite:  Fair  Suicidal Ideation:  Plan:  Denies Intent:  Denies Means:  Denies Homicidal Ideation:  Plan:  Denies Intent:  Denies Means:  Denies  AEB (as evidenced by):  Mental Status Examination/Evaluation: Objective:  Appearance: Fairly Groomed  Patent attorney::  Fair  Speech:  Slow  Volume:  Decreased  Mood:  " doing better, want to go home, have things to do."  Affect:  Restricted  Thought Process:  Coherent and Goal Directed  Orientation:  Other:  Person, Place  Thought Content:  WDL  Suicidal Thoughts:  No  Homicidal Thoughts:  No  Memory:  Immediate;   Fair Recent;   Fair Remote;   Fair  Judgement:  Fair  Insight:  Shallow  Psychomotor Activity:  Decreased  Concentration:  Fair  Recall:  Fair  Akathisia:  No  Handed:  Right  AIMS (if indicated):     Assets:  Desire for Improvement Housing Social Support  Sleep:  Number of Hours: 5    Vital Signs:Blood pressure 89/60, pulse 80, temperature 97.3 F (36.3 C), temperature source Oral, resp. rate 18, SpO2 100.00%. Current Medications: Current Facility-Administered Medications  Medication Dose Route Frequency Provider Last Rate Last Dose  . budesonide-formoterol (SYMBICORT) 80-4.5 MCG/ACT inhaler 2 puff  2 puff Inhalation BID Rachael Fee, MD   2 puff at 05/24/12 0810  . folic acid (FOLVITE) tablet 1 mg  1 mg Oral Daily Rachael Fee, MD   1 mg at 05/24/12 0809  . ibuprofen (ADVIL,MOTRIN) 200 MG tablet           . ibuprofen (ADVIL,MOTRIN) tablet 400 mg  400 mg Oral Q6H PRN Sanjuana Kava, NP   400 mg at 05/24/12 1549  . multivitamin with minerals tablet 1 tablet  1 tablet Oral Daily Norval Gable, NP   1 tablet at 05/24/12 0810  . nicotine (NICODERM CQ - dosed in mg/24 hours) patch 21 mg  21 mg Transdermal Q0600  Rachael Fee, MD   21 mg at 05/24/12 0607  . simvastatin (ZOCOR) tablet 5 mg  5 mg Oral q1800 Rachael Fee, MD   5 mg at 05/23/12 1819  . thiamine (VITAMIN B-1) tablet 100 mg  100 mg Oral Daily Norval Gable, NP   100 mg at 05/24/12 0809  . traZODone (DESYREL) tablet 50 mg  50 mg Oral QHS Rachael Fee, MD   50 mg at 05/23/12 2124  . vitamin B-12 (CYANOCOBALAMIN) tablet 100 mcg  100 mcg Oral Daily Rachael Fee, MD   100 mcg at 05/24/12 0809    Lab Results: No results found for this or any previous visit (from the past 48 hour(s)).  Physical Findings: AIMS: Facial and Oral Movements Muscles of Facial Expression: Minimal Lips and Perioral Area: None, normal Jaw: None, normal Tongue: None, normal,Extremity Movements Upper (arms, wrists, hands, fingers): None, normal Lower (legs, knees, ankles, toes): None, normal, Trunk Movements Neck, shoulders, hips: Minimal, Overall Severity Severity of abnormal movements (highest score from questions above): Minimal Incapacitation due to abnormal movements: Minimal Patient's awareness of abnormal movements (rate only patient's report): Aware, no distress, Dental Status Current problems with teeth and/or dentures?: Yes (teeth extracted this month, needs soft diet) Does patient usually wear dentures?: No  CIWA:  CIWA-Ar Total: 0  COWS:  COWS Total Score: 12   Treatment Plan Summary: Daily contact with patient to assess and evaluate symptoms and progress in treatment Medication management  Plan: Supportive approach/coping skills/relapse prevention           1:1 Observation D/C            Lamiah Marmol A 05/24/2012, 4:21 PM

## 2012-05-25 MED ORDER — NICOTINE 7 MG/24HR TD PT24: 1.0000 | MEDICATED_PATCH | TRANSDERMAL | Status: DC

## 2012-05-25 MED ORDER — TRAZODONE HCL 50 MG PO TABS: 50.0000 mg | ORAL_TABLET | Freq: Every day | ORAL | Status: DC

## 2012-05-25 MED ORDER — ACAMPROSATE CALCIUM 333 MG PO TBEC: 666.0000 mg | DELAYED_RELEASE_TABLET | Freq: Three times a day (TID) | ORAL | Status: DC

## 2012-05-25 MED ORDER — VITAMIN E 200 UNITS PO CAPS: 200.0000 [IU] | ORAL_CAPSULE | Freq: Every day | ORAL | Status: DC

## 2012-05-25 NOTE — Progress Notes (Signed)
[facility] Case Management Discharge Plan:  Will you be returning to the same living situation after discharge: Yes,  returning home At discharge, do you have transportation home?:Yes,  mom will pick pt up Do you have the ability to pay for your medications:Yes,  access to meds  Release of information consent forms completed and in the chart;  Patient's signature needed at discharge.  Patient to Follow up at:  Follow-up Information    Follow up with [facility]. On 05/26/2012. (Will pick you up at 1:00 pm on this date)    Contact information:   [address] [place], [place] [address] [phone]         Patient denies SI/HI:   Yes,  denies SI/HI    Safety Planning and Suicide Prevention discussed:  Yes,  discussed with pt  Barrier to discharge identified:No.  Summary and Recommendations: Pt attended discharge planning group and actively participated in group.  CSW provided pt with today's workbook.  Pt presents with calm mood and affect.  Pt denies having depression, anxiety and SI/HI.  Pt reports feeling stable to d/c today.  No recommendations from CSW.  No further needs voiced by pt.  Pt stable to discharge.     Carmina Miller 05/25/2012, 9:53 AM

## 2012-05-25 NOTE — Progress Notes (Signed)
[facility] Group Notes:  (Counselor/Nursing/MHT/Case Management/Adjunct)  05/25/2012 12:35 PM  Type of Therapy:  Psychoeducational Skills  Participation Level:  Minimal  Participation Quality:  Attentive  Affect:  Appropriate  Cognitive:  Alert, Appropriate and Oriented  Insight:  Limited  Engagement in Group:  Good  Engagement in Therapy:  n/a  Modes of Intervention:  Activity, Education, Problem-solving, Socialization and Support  Summary of Progress/Problems: Montavis attended a psycho-education group that focused on using quality time with support systems/individuals to engage in health coping skills. [name] participated with assitance in activity guessing about self and peers. Ryland had difficulty hearing and seeing which was a barrier to his group participation and engagement. Trendon was quiet but participated when prompted while group discussed who their supports are, how they can spend positive quality time with them as a coping skills and a way to strengthen their relationship. Jaki was given a homework assignment to find two ways to improve his support systems and twenty activities he can do to spend quality time with his supports.    Wandra Scot 05/25/2012, 12:35 PM

## 2012-05-25 NOTE — Discharge Summary (Signed)
Physician Discharge Summary Note  Patient:  Peter [name] is an 61 y.o., male MRN:  [id number] DOB:  March 20, 1951 Patient phone:  [phone] (home)  Patient address:   [address]  [place] [address],   Date of Admission:  05/18/2012 Date of Discharge: 05/25/2012  Reason for Admission:  Alcohol detox  Discharge Diagnoses: Principal Problem:  *Alcohol dependence Active Problems:  Substance induced mood disorder   Axis Diagnosis:  AXIS I:  Alcohol Abuse and Substance Induced Mood Disorder AXIS II:  Deferred AXIS III:   Past Medical History  Diagnosis Date  . Hypertension   . Hyperlipemia   . Emphysema   . Stroke   . Chronic pain   . Cirrhosis of liver   . Fall   . Dental injury   . Bone spur of other site     Left Foot  . Impaired mobility   . Total self care deficit   . Pneumonia   . Emphysema   . ETOH abuse   . Elevated LFTs    AXIS IV:  economic problems, occupational problems, other psychosocial or environmental problems, problems related to social environment and problems with primary support group AXIS V:  61-70 mild symptoms  Level of Care:  OP  Hospital Course:   Patient attended individual and group therapy while inpatient along with attending AA groups, one-one time with MD daily, medications for detox managed during inpatient, follow-up appointments made prior to discharge   Consults:  None  Significant Diagnostic Studies:  labs: Completed and reviewed, stable  Discharge Vitals:   Blood pressure 120/77, pulse 102, temperature 98.4 F (36.9 C), temperature source Oral, resp. rate 16, SpO2 100.00%. Lab Results:   No results found for this or any previous visit (from the past 72 hour(s)).  Physical Findings: AIMS: Facial and Oral Movements Muscles of Facial Expression: Minimal Lips and Perioral Area: None, normal Jaw: None, normal Tongue: None, normal,Extremity Movements Upper (arms, wrists, hands, fingers): None, normal Lower (legs,  knees, ankles, toes): None, normal, Trunk Movements Neck, shoulders, hips: Minimal, Overall Severity Severity of abnormal movements (highest score from questions above): Minimal Incapacitation due to abnormal movements: Minimal Patient's awareness of abnormal movements (rate only patient's report): Aware, no distress, Dental Status Current problems with teeth and/or dentures?: Yes (teeth extracted this month, needs soft diet) Does patient usually wear dentures?: No  CIWA:  CIWA-Ar Total: 0  COWS:  COWS Total Score: 12   Mental Status Exam: See Mental Status Examination and Suicide Risk Assessment completed by Attending Physician prior to discharge.  Discharge destination:  Home  Is patient on multiple antipsychotic therapies at discharge:  No   Has Patient had three or more failed trials of antipsychotic monotherapy by history:  No Recommended Plan for Multiple Antipsychotic Therapies:  N/A  Discharge Orders    Future Orders Please Complete By Expires   Diet - low sodium heart healthy      Activity as tolerated - No restrictions          Medication List     As of 05/25/2012  9:05 AM    STOP taking these medications         guaiFENesin-dextromethorphan 100-10 MG/5ML syrup   Commonly known as: ROBITUSSIN DM      HYDROcodone-acetaminophen 7.5-500 MG per tablet   Commonly known as: LORTAB      oxyCODONE-acetaminophen 5-325 MG per tablet   Commonly known as: PERCOCET/ROXICET      TAKE these medications  Indication    acamprosate 333 MG tablet   Commonly known as: CAMPRAL   Take 2 tablets (666 mg total) by mouth 3 (three) times daily.    Indication: Excessive Use of Alcohol      albuterol 108 (90 BASE) MCG/ACT inhaler   Commonly known as: PROVENTIL HFA;VENTOLIN HFA   Inhale 2 puffs into the lungs every 6 (six) hours as needed. Shortness of breath       budesonide-formoterol 80-4.5 MCG/ACT inhaler   Commonly known as: SYMBICORT   Inhale 2 puffs into the lungs 2  (two) times daily. For COPD.    Indication: Chronic Obstructive Lung Disease      ferrous sulfate 325 (65 FE) MG tablet   Take 325 mg by mouth daily with breakfast. For low iron.       folic acid 1 MG tablet   Commonly known as: FOLVITE   Take 1 mg by mouth daily. For low folate.    Indication: Deficiency of Folic Acid in the Diet      nicotine 7 mg/24hr patch   Commonly known as: NICODERM CQ - dosed in mg/24 hr   Place 1 patch onto the skin daily.    Indication: Nicotine Addiction      pravastatin 40 MG tablet   Commonly known as: PRAVACHOL   Take 80 mg by mouth daily. To lower your cholesterol.    Indication: Type II A Hyperlipidemia      thiamine 100 MG tablet   Commonly known as: VITAMIN B-1   Take 100 mg by mouth daily. For vitamin B deficiency.    Indication: Deficiency in Thiamine or Vitamin B1      traMADol 50 MG tablet   Commonly known as: ULTRAM   Take 50 mg by mouth 2 (two) times daily as needed. For pain       traZODone 50 MG tablet   Commonly known as: DESYREL   Take 1 tablet (50 mg total) by mouth at bedtime.    Indication: Trouble Sleeping      vitamin B-12 100 MCG tablet   Commonly known as: CYANOCOBALAMIN   Take 100 mcg by mouth daily. For low B12.       vitamin E 200 UNIT capsule   Take 1 capsule (200 Units total) by mouth daily.    Indication: Deficiency of Vitamin E           Follow-up Information    Follow up with [facility]. On 05/26/2012. (Will pick you up at 1:00 pm on this date)    Contact information:   [address] [place], [place] [address] [phone]        Follow-up recommendations:  Activity as tolerated, low-sodium heart healthy diet  Comments:  Patient denied suicidal/homicidal ideations and auditory/visual hallucinations, follow-up appointments encouraged to attend, outside support groups encouraged and information given, patient's pulse per electronic dynamap was elevated--should be done manually for accuracy  (patient's arms shake, interfering with proper readings, patient encouraged to drink fluids--especially water  Signed: Nanine Means, NP 05/25/2012, 9:05 AM

## 2012-05-25 NOTE — Progress Notes (Signed)
Patient ID: Peter [name], male   DOB: Jun 12, 1951, 61 y.o.   MRN: [id number] Patient denies SI/HI and A/V hallucinations; patient received copy of AVS, samples and prescriptions once it was reviewed with him; patient signed that he received all belongings and patient had no other questions or concerns

## 2012-05-25 NOTE — Progress Notes (Signed)
Pt reports he is having a good day.  He was taken off the 1:1 because he is not as unsteady and his thinking has become clearer.  He says he feels better and is ready to go home.  He lives with his mother and plans are for him to return there at discharge.  Pt says he will probably be discharged tomorrow.  "I have things to do."  He smiles and answers questions appropriately.  He denies withdrawal symptoms.  He denies SI/HI/AV.  He attended evening AA group.  He was encouraged to make his needs known to staff.  Pt voices understanding.  Safety maintained with q15 minute checks.

## 2012-05-25 NOTE — [facility] Suicide Risk Assessment (Signed)
Suicide Risk Assessment  Discharge Assessment     Demographic Factors:  Male, Low socioeconomic status and Unemployed  Mental Status Per Nursing Assessment::   On Admission:     Current Mental Status by Physician: In full contact with reality. There are no suicidal ideas, plans or intent. He is fully detox. His mood is improved, his affect is broader broader. He is committed to abstinence. He will be staying with his mother. Will pursue outpatient treatment.  Loss Factors: Decline in physical health  Historical Factors: NA  Risk Reduction Factors:   Living with another person, especially a relative and Positive social support  Continued Clinical Symptoms:  Alcohol/Substance Abuse/Dependencies  Cognitive Features That Contribute To Risk: No evidence  Suicide Risk:  Minimal: No identifiable suicidal ideation.  Patients presenting with no risk factors but with morbid ruminations; may be classified as minimal risk based on the severity of the depressive symptoms  Discharge Diagnoses:   AXIS I:  Alcohol Dependence, Substance Induced Mood Disorder AXIS II:  Deferred AXIS III:   Past Medical History  Diagnosis Date  . Hypertension   . Hyperlipemia   . Emphysema   . Stroke   . Chronic pain   . Cirrhosis of liver   . Fall   . Dental injury   . Bone spur of other site     Left Foot  . Impaired mobility   . Total self care deficit   . Pneumonia   . Emphysema   . ETOH abuse   . Elevated LFTs    AXIS IV:  None Identified at this time AXIS V:  61-70 mild symptoms  Plan Of Care/Follow-up recommendations:  Activity:  As tolerated Diet:  Regular Continue Outpatient Follow up  Is patient on multiple antipsychotic therapies at discharge:  No   Has Patient had three or more failed trials of antipsychotic monotherapy by history:  No  Recommended Plan for Multiple Antipsychotic Therapies: N/A   Marlisa Caridi A 05/25/2012, 10:23 AM

## 2012-05-25 NOTE — Tx Team (Signed)
Interdisciplinary Treatment Plan Update (Adult)  Date:  05/25/2012  Time Reviewed:  9:53 AM   Progress in Treatment: Attending groups: Yes Participating in groups:  Yes Taking medication as prescribed: Yes Tolerating medication:  Yes Family/Significant othe contact made:  Yes Patient understands diagnosis:  Yes Discussing patient identified problems/goals with staff:  Yes Medical problems stabilized or resolved:  Yes Denies suicidal/homicidal ideation: Yes Issues/concerns per patient self-inventory:  None identified Other: N/A  New problem(s) identified: None Identified  Reason for Continuation of Hospitalization: Stable to d/c  Interventions implemented related to continuation of hospitalization: Stable to d/c  Additional comments: N/A  Estimated length of stay: D/C today  Discharge Plan: [facility].    New goal(s): N/A  Review of initial/current patient goals per problem list:    All goals met  Attendees: Patient:   05/25/2012 9:53 AM   Family:     Physician:  Geoffery Lyons, MD 05/25/2012 9:53 AM   Nursing: Geronimo Boot, RN 05/25/2012 9:53 AM   Clinical Social Worker:  Reyes Ivan, LCSWA 05/25/2012 9:53 AM   Other:  05/25/2012 9:53 AM   Other:     Other:     Other:     Other:      Scribe for Treatment Team:   Reyes Ivan 05/25/2012 9:53 AM

## 2012-05-28 NOTE — Progress Notes (Signed)
Patient Discharge Instructions:  After Visit Summary (AVS):   Faxed to:  05/28/12 Psychiatric Admission Assessment Note:   Faxed to:  05/28/12 Suicide Risk Assessment - Discharge Assessment:   Faxed to:  05/28/12 Faxed/Sent to the Next Level Care provider:  05/28/12 Faxed to [facility] @ [phone]  Jerelene Redden, 05/28/2012, 4:19 PM

## 2012-05-30 NOTE — Discharge Summary (Signed)
Agree with assessment and plan Eliyah Mcshea A. Roniqua Kintz, M.D. 

## 2012-08-30 ENCOUNTER — Inpatient Hospital Stay (HOSPITAL_COMMUNITY)
Admission: EM | Admit: 2012-08-30 | Discharge: 2012-09-01 | DRG: 897 | Disposition: A | Discharge status: Home / Self Care | Payer: Medicaid Other | Attending: Internal Medicine | Admitting: Internal Medicine

## 2012-08-30 ENCOUNTER — Encounter (HOSPITAL_COMMUNITY): Payer: Self-pay

## 2012-08-30 DIAGNOSIS — J449 Chronic obstructive pulmonary disease, unspecified: Secondary | ICD-10-CM | POA: Diagnosis present

## 2012-08-30 DIAGNOSIS — F10229 Alcohol dependence with intoxication, unspecified: Secondary | ICD-10-CM | POA: Diagnosis present

## 2012-08-30 DIAGNOSIS — F10239 Alcohol dependence with withdrawal, unspecified: Principal | ICD-10-CM

## 2012-08-30 DIAGNOSIS — G8929 Other chronic pain: Secondary | ICD-10-CM | POA: Diagnosis present

## 2012-08-30 DIAGNOSIS — D509 Iron deficiency anemia, unspecified: Secondary | ICD-10-CM | POA: Diagnosis present

## 2012-08-30 DIAGNOSIS — I10 Essential (primary) hypertension: Secondary | ICD-10-CM | POA: Diagnosis present

## 2012-08-30 DIAGNOSIS — K7030 Alcoholic cirrhosis of liver without ascites: Secondary | ICD-10-CM | POA: Diagnosis present

## 2012-08-30 DIAGNOSIS — F10939 Alcohol use, unspecified with withdrawal, unspecified: Principal | ICD-10-CM | POA: Diagnosis present

## 2012-08-30 DIAGNOSIS — F1020 Alcohol dependence, uncomplicated: Secondary | ICD-10-CM

## 2012-08-30 DIAGNOSIS — E611 Iron deficiency: Secondary | ICD-10-CM | POA: Diagnosis present

## 2012-08-30 DIAGNOSIS — Z79899 Other long term (current) drug therapy: Secondary | ICD-10-CM

## 2012-08-30 DIAGNOSIS — F1010 Alcohol abuse, uncomplicated: Secondary | ICD-10-CM

## 2012-08-30 DIAGNOSIS — E871 Hypo-osmolality and hyponatremia: Secondary | ICD-10-CM | POA: Diagnosis present

## 2012-08-30 DIAGNOSIS — F17200 Nicotine dependence, unspecified, uncomplicated: Secondary | ICD-10-CM | POA: Diagnosis present

## 2012-08-30 DIAGNOSIS — E785 Hyperlipidemia, unspecified: Secondary | ICD-10-CM | POA: Diagnosis present

## 2012-08-30 DIAGNOSIS — J438 Other emphysema: Secondary | ICD-10-CM | POA: Diagnosis present

## 2012-08-30 DIAGNOSIS — Z8673 Personal history of transient ischemic attack (TIA), and cerebral infarction without residual deficits: Secondary | ICD-10-CM

## 2012-08-30 DIAGNOSIS — F10929 Alcohol use, unspecified with intoxication, unspecified: Secondary | ICD-10-CM

## 2012-08-30 HISTORY — DX: Unspecified hearing loss, unspecified ear: H91.90

## 2012-08-30 HISTORY — DX: Chronic sinusitis, unspecified: J32.9

## 2012-08-30 HISTORY — DX: Unspecified osteoarthritis, unspecified site: M19.90

## 2012-08-30 HISTORY — DX: Anxiety disorder, unspecified: F41.9

## 2012-08-30 HISTORY — DX: Anemia, unspecified: D64.9

## 2012-08-30 LAB — CBC WITH DIFFERENTIAL/PLATELET
Basophils Absolute: 0 10*3/uL (ref 0.0–0.1)
HCT: 33.1 % — ABNORMAL LOW (ref 39.0–52.0)
HCT: 31 % — ABNORMAL LOW (ref 39.0–52.0)
Hemoglobin: 11.2 g/dL — ABNORMAL LOW (ref 13.0–17.0)
Hemoglobin: 10.6 g/dL — ABNORMAL LOW (ref 13.0–17.0)
Lymphocytes Relative: 30 % (ref 12–46)
Lymphs Abs: 1.2 10*3/uL (ref 0.7–4.0)
MCH: 26 pg (ref 26.0–34.0)
Monocytes Absolute: 0.7 10*3/uL (ref 0.1–1.0)
Monocytes Absolute: 1 10*3/uL (ref 0.1–1.0)
Monocytes Relative: 18 % — ABNORMAL HIGH (ref 3–12)
Neutro Abs: 2.1 10*3/uL (ref 1.7–7.7)
Neutro Abs: 2.4 10*3/uL (ref 1.7–7.7)
Neutrophils Relative %: 52 % (ref 43–77)
Neutrophils Relative %: 49 % (ref 43–77)
RBC: 4.31 MIL/uL (ref 4.22–5.81)
RDW: 15.5 % (ref 11.5–15.5)
WBC: 4.9 10*3/uL (ref 4.0–10.5)

## 2012-08-30 LAB — PHOSPHORUS: Phosphorus: 3.4 mg/dL (ref 2.3–4.6)

## 2012-08-30 LAB — COMPREHENSIVE METABOLIC PANEL
AST: 26 U/L (ref 0–37)
Albumin: 3.6 g/dL (ref 3.5–5.2)
Alkaline Phosphatase: 80 U/L (ref 39–117)
BUN: 11 mg/dL (ref 6–23)
BUN: 10 mg/dL (ref 6–23)
Calcium: 9 mg/dL (ref 8.4–10.5)
Chloride: 84 mEq/L — ABNORMAL LOW (ref 96–112)
Creatinine, Ser: 0.88 mg/dL (ref 0.50–1.35)
GFR calc Af Amer: >90 mL/min (ref >90)
GFR calc non Af Amer: 89 mL/min — ABNORMAL LOW (ref >90)
GFR calc non Af Amer: >90 mL/min (ref >90)
Glucose, Bld: 93 mg/dL (ref 70–99)
Potassium: 3.6 mEq/L (ref 3.5–5.1)
Total Bilirubin: 0.7 mg/dL (ref 0.3–1.2)

## 2012-08-30 LAB — RAPID URINE DRUG SCREEN, HOSP PERFORMED: Opiates: NOT DETECTED

## 2012-08-30 LAB — APTT: aPTT: 38 seconds — ABNORMAL HIGH (ref 24–37)

## 2012-08-30 LAB — MAGNESIUM: Magnesium: 1.7 mg/dL (ref 1.5–2.5)

## 2012-08-30 LAB — PROTIME-INR
INR: 1.02 (ref 0.00–1.49)
Prothrombin Time: 13.3 seconds (ref 11.6–15.2)

## 2012-08-30 MED ORDER — SIMVASTATIN 20 MG PO TABS
20.0000 mg | ORAL_TABLET | Freq: Every day | ORAL | Status: DC
  Administered 2012-08-31: 20 mg via ORAL
  Filled 2012-08-30 (×2): qty 1

## 2012-08-30 MED ORDER — ONDANSETRON HCL 4 MG/2ML IJ SOLN: 4.0000 mg | Freq: Three times a day (TID) | INTRAMUSCULAR | Status: DC | PRN

## 2012-08-30 MED ORDER — TRAMADOL HCL 50 MG PO TABS: 50.0000 mg | ORAL_TABLET | Freq: Two times a day (BID) | ORAL | Status: DC | PRN

## 2012-08-30 MED ORDER — VITAMIN B-1 100 MG PO TABS
100.0000 mg | ORAL_TABLET | Freq: Every day | ORAL | Status: DC
  Administered 2012-08-31 – 2012-09-01 (×2): 100 mg via ORAL
  Filled 2012-08-30 (×2): qty 1

## 2012-08-30 MED ORDER — FOLIC ACID 1 MG PO TABS
1.0000 mg | ORAL_TABLET | Freq: Every day | ORAL | Status: DC
  Administered 2012-08-31 – 2012-09-01 (×2): 1 mg via ORAL
  Filled 2012-08-30 (×2): qty 1

## 2012-08-30 MED ORDER — TRAZODONE HCL 50 MG PO TABS
50.0000 mg | ORAL_TABLET | Freq: Every day | ORAL | Status: DC
  Administered 2012-08-30 – 2012-08-31 (×2): 50 mg via ORAL
  Filled 2012-08-30 (×3): qty 1

## 2012-08-30 MED ORDER — SODIUM CHLORIDE 0.9 % IJ SOLN: 3.0000 mL | Freq: Two times a day (BID) | INTRAMUSCULAR | Status: DC

## 2012-08-30 MED ORDER — ACETAMINOPHEN 325 MG PO TABS: 650.0000 mg | ORAL_TABLET | Freq: Four times a day (QID) | ORAL | Status: DC | PRN

## 2012-08-30 MED ORDER — BUDESONIDE-FORMOTEROL FUMARATE 80-4.5 MCG/ACT IN AERO
2.0000 | INHALATION_SPRAY | Freq: Two times a day (BID) | RESPIRATORY_TRACT | Status: DC
  Administered 2012-08-30 – 2012-09-01 (×3): 2 via RESPIRATORY_TRACT
  Filled 2012-08-30: qty 6.9

## 2012-08-30 MED ORDER — HYDROCODONE-ACETAMINOPHEN 5-325 MG PO TABS: 1.0000 | ORAL_TABLET | ORAL | Status: DC | PRN

## 2012-08-30 MED ORDER — ONDANSETRON HCL 4 MG PO TABS: 4.0000 mg | ORAL_TABLET | Freq: Four times a day (QID) | ORAL | Status: DC | PRN

## 2012-08-30 MED ORDER — ONDANSETRON HCL 4 MG/2ML IJ SOLN: 4.0000 mg | Freq: Four times a day (QID) | INTRAMUSCULAR | Status: DC | PRN

## 2012-08-30 MED ORDER — FERROUS SULFATE 325 (65 FE) MG PO TABS
325.0000 mg | ORAL_TABLET | Freq: Every day | ORAL | Status: DC
  Administered 2012-08-31 – 2012-09-01 (×2): 325 mg via ORAL
  Filled 2012-08-30 (×3): qty 1

## 2012-08-30 MED ORDER — ACAMPROSATE CALCIUM 333 MG PO TBEC
666.0000 mg | DELAYED_RELEASE_TABLET | Freq: Three times a day (TID) | ORAL | Status: DC
  Administered 2012-08-30 – 2012-09-01 (×6): 666 mg via ORAL
  Filled 2012-08-30 (×8): qty 2

## 2012-08-30 MED ORDER — SODIUM CHLORIDE 0.9 % IV SOLN
INTRAVENOUS | Status: DC
  Administered 2012-08-30 – 2012-09-01 (×4): via INTRAVENOUS

## 2012-08-30 MED ORDER — VITAMIN E 45 MG (100 UNIT) PO CAPS
200.0000 [IU] | ORAL_CAPSULE | Freq: Every day | ORAL | Status: DC
  Administered 2012-08-31 – 2012-09-01 (×2): 200 [IU] via ORAL
  Filled 2012-08-30 (×2): qty 2

## 2012-08-30 MED ORDER — LORAZEPAM 2 MG/ML IJ SOLN
2.0000 mg | Freq: Once | INTRAMUSCULAR | Status: AC
  Administered 2012-08-30: 2 mg via INTRAVENOUS
  Filled 2012-08-30: qty 1

## 2012-08-30 MED ORDER — DEXTROSE-NACL 5-0.45 % IV SOLN: INTRAVENOUS | Status: DC

## 2012-08-30 MED ORDER — ACETAMINOPHEN 650 MG RE SUPP: 650.0000 mg | Freq: Four times a day (QID) | RECTAL | Status: DC | PRN

## 2012-08-30 MED ORDER — SODIUM CHLORIDE 0.9 % IV BOLUS (SEPSIS)
2000.0000 mL | Freq: Once | INTRAVENOUS | Status: AC
  Administered 2012-08-30: 2000 mL via INTRAVENOUS

## 2012-08-30 MED ORDER — DEXTROSE-NACL 5-0.9 % IV SOLN: Freq: Once | INTRAVENOUS | Status: DC

## 2012-08-30 MED ORDER — VITAMIN B-12 100 MCG PO TABS
100.0000 ug | ORAL_TABLET | Freq: Every day | ORAL | Status: DC
  Administered 2012-08-31 – 2012-09-01 (×2): 100 ug via ORAL
  Filled 2012-08-30 (×2): qty 1

## 2012-08-30 MED ORDER — ALBUTEROL SULFATE HFA 108 (90 BASE) MCG/ACT IN AERS: 2.0000 | INHALATION_SPRAY | Freq: Four times a day (QID) | RESPIRATORY_TRACT | Status: DC | PRN

## 2012-08-30 MED ORDER — NICOTINE 7 MG/24HR TD PT24
7.0000 mg | MEDICATED_PATCH | TRANSDERMAL | Status: DC
  Administered 2012-08-30 – 2012-08-31 (×2): 7 mg via TRANSDERMAL
  Filled 2012-08-30 (×2): qty 1

## 2012-08-30 NOTE — ED Provider Notes (Signed)
History     CSN: [id number]  Arrival date & time 08/30/12  1140   First MD Initiated Contact with Patient 08/30/12 1302      Chief Complaint  Patient presents with  . Medical Clearance    sent from [facility] [place].  . Addiction Problem    (Consider location/radiation/quality/duration/timing/severity/associated sxs/prior treatment) HPI This 62 year old male was sent to the emergency department by his substance abuse center's therapist for an inpatient medical admission for alcohol detox because he has had severe withdrawal in the past, he was just discharged from his primary care doctor's practice today due to continued alcohol abuse, the patient is here voluntarily, he is not a threat to himself or others, he is not suicidal or homicidal or hallucinating, he is not in severe withdrawal at this time although he does have some anxiety and shakes, he is no vomiting no hallucinations no confusion, he is no chest pain abdominal pain shortness of breath or vomiting. There is no treatment prior to arrival. His last alcohol was today just prior to arrival. Past Medical History  Diagnosis Date  . Hypertension   . Hyperlipemia   . Emphysema   . Stroke   . Chronic pain   . Cirrhosis of liver   . Fall   . Dental injury   . Bone spur of other site     Left Foot  . Impaired mobility   . Total self care deficit   . Pneumonia   . Emphysema   . ETOH abuse   . Elevated LFTs   . Arthritis   . Anxiety   . Sinusitis   . Anemia     iron def.  . Hard of hearing     Past Surgical History  Procedure Laterality Date  . No past surgeries    . Colonoscopy  01/06/2012    Procedure: COLONOSCOPY;  Surgeon: Theda Belfast, MD;  Location: [facility] ENDOSCOPY;  Service: Endoscopy;  Laterality: N/A;  . Esophagogastroduodenoscopy  01/06/2012    Procedure: ESOPHAGOGASTRODUODENOSCOPY (EGD);  Surgeon: Theda Belfast, MD;  Location: [facility] ENDOSCOPY;  Service: Endoscopy;  Laterality: N/A;  .  Esophagogastroduodenoscopy  01/09/2012    Procedure: ESOPHAGOGASTRODUODENOSCOPY (EGD);  Surgeon: Theda Belfast, MD;  Location: [facility] ENDOSCOPY;  Service: Endoscopy;  Laterality: N/A;  . Multiple extractions with alveoloplasty  03/29/2012    Procedure: MULTIPLE EXTRACION WITH ALVEOLOPLASTY;  Surgeon: Georgia Lopes, DDS;  Location: [facility] OR;  Service: Oral Surgery;  Laterality: Bilateral;  . Bone spur  2013    left spur    History reviewed. No pertinent family history.  History  Substance Use Topics  . Smoking status: Current Every Day Smoker -- 2.00 packs/day for 30 years    Types: Cigarettes  . Smokeless tobacco: Never Used  . Alcohol Use: 2.4 oz/week    4 Cans of beer per week     Comment: per pt four  40oz daily beer      Review of Systems 10 Systems reviewed and are negative for acute change except as noted in the HPI. Allergies  Poison ivy extract  Home Medications   Current Outpatient Rx  Name  Route  Sig  Dispense  Refill  . acamprosate (CAMPRAL) 333 MG tablet   Oral   Take 2 tablets (666 mg total) by mouth 3 (three) times daily.   6 tablet   0   . albuterol (PROVENTIL HFA;VENTOLIN HFA) 108 (90 BASE) MCG/ACT inhaler   Inhalation   Inhale  2 puffs into the lungs every 6 (six) hours as needed. Shortness of breath         . budesonide-formoterol (SYMBICORT) 80-4.5 MCG/ACT inhaler   Inhalation   Inhale 2 puffs into the lungs 2 (two) times daily. For COPD.         . ferrous sulfate 325 (65 FE) MG tablet   Oral   Take 325 mg by mouth daily with breakfast. For low iron.         . folic acid (FOLVITE) 1 MG tablet   Oral   Take 1 mg by mouth daily. For low folate.         . nicotine (NICODERM CQ - DOSED IN MG/24 HR) 7 mg/24hr patch   Transdermal   Place 1 patch onto the skin daily.   28 patch   0   . pravastatin (PRAVACHOL) 40 MG tablet   Oral   Take 80 mg by mouth daily. To lower your cholesterol.         . thiamine (VITAMIN B-1) 100 MG tablet   Oral    Take 100 mg by mouth daily. For vitamin B deficiency.         . traMADol (ULTRAM) 50 MG tablet   Oral   Take 50 mg by mouth 2 (two) times daily as needed. For pain         . traZODone (DESYREL) 50 MG tablet   Oral   Take 1 tablet (50 mg total) by mouth at bedtime.   30 tablet   0   . vitamin B-12 (CYANOCOBALAMIN) 100 MCG tablet   Oral   Take 100 mcg by mouth daily. For low B12.         . vitamin E 200 UNIT capsule   Oral   Take 1 capsule (200 Units total) by mouth daily.   7 capsule   0   . LORazepam (ATIVAN) 1 MG tablet   Oral   Take 1 tablet (1 mg total) by mouth every 8 (eight) hours as needed for anxiety.   10 tablet   0   . Multiple Vitamin (MULTIVITAMIN) tablet   Oral   Take 1 tablet by mouth daily.   30 tablet   0     BP 108/69  Pulse 109  Temp(Src) 98.6 F (37 C) (Oral)  Resp 18  Ht 5\' 7"  (1.702 m)  Wt 144 lb 10 oz (65.6 kg)  BMI 22.65 kg/m2  SpO2 98%  Physical Exam  Nursing note and vitals reviewed. Constitutional: He is oriented to person, place, and time.  Awake, alert, nontoxic appearance with baseline speech for patient.  HENT:  Head: Atraumatic.  Mouth/Throat: No oropharyngeal exudate.  Eyes: EOM are normal. Pupils are equal, round, and reactive to light. Right eye exhibits no discharge. Left eye exhibits no discharge.  Neck: Neck supple.  Cardiovascular: Regular rhythm.   No murmur heard. Tachycardic  Pulmonary/Chest: Effort normal and breath sounds normal. No stridor. No respiratory distress. He has no wheezes. He has no rales. He exhibits no tenderness.  Abdominal: Soft. Bowel sounds are normal. He exhibits no mass. There is no tenderness. There is no rebound.  Musculoskeletal: He exhibits no tenderness.  Baseline ROM, moves extremities with no obvious new focal weakness.  Lymphadenopathy:    He has no cervical adenopathy.  Neurological: He is alert and oriented to person, place, and time.  Awake, alert, cooperative and aware of  situation; motor strength bilaterally; sensation normal to light  touch bilaterally; peripheral visual fields full to confrontation; no facial asymmetry; tongue midline; major cranial nerves appear intact; no pronator drift, normal finger to nose bilaterally  Skin: No rash noted.  Psychiatric: He has a normal mood and affect.    ED Course  Procedures (including critical care time) D/w Orlene Och Pt's SA counselor and reviewed Dr. Haywood Pao note discharging pt from his practice due to ongoing EtOH abuse. [facility] paged. 1320   Labs Reviewed  CBC WITH DIFFERENTIAL - Abnormal; Notable for the following:    Hemoglobin 11.2 (*)    HCT 33.1 (*)    MCV 76.8 (*)    RDW 15.6 (*)    Monocytes Relative 18 (*)    All other components within normal limits  COMPREHENSIVE METABOLIC PANEL - Abnormal; Notable for the following:    Sodium 124 (*)    Chloride 84 (*)    Total Protein 8.4 (*)    GFR calc non Af Amer 89 (*)    All other components within normal limits  ETHANOL - Abnormal; Notable for the following:    Alcohol, Ethyl (B) 25 (*)    All other components within normal limits  COMPREHENSIVE METABOLIC PANEL - Abnormal; Notable for the following:    Sodium 130 (*)    Chloride 93 (*)    All other components within normal limits  CBC WITH DIFFERENTIAL - Abnormal; Notable for the following:    RBC 4.02 (*)    Hemoglobin 10.6 (*)    HCT 31.0 (*)    MCV 77.1 (*)    Monocytes Relative 20 (*)    All other components within normal limits  APTT - Abnormal; Notable for the following:    aPTT 38 (*)    All other components within normal limits  COMPREHENSIVE METABOLIC PANEL - Abnormal; Notable for the following:    Albumin 3.3 (*)    All other components within normal limits  CBC - Abnormal; Notable for the following:    WBC 3.5 (*)    RBC 3.87 (*)    Hemoglobin 10.3 (*)    HCT 30.2 (*)    RDW 15.7 (*)    All other components within normal limits  URINE RAPID DRUG SCREEN (HOSP PERFORMED)   MAGNESIUM  PHOSPHORUS  PROTIME-INR  GLUCOSE, CAPILLARY  TROPONIN I  GLUCOSE, CAPILLARY   No results found.   1. Alcohol abuse   2. Alcohol dependence   3. Alcohol withdrawal   4. Hyponatremia   5. Acute alcohol intoxication   6. COPD (chronic obstructive pulmonary disease)   7. Iron deficiency       MDM   Pt stable in ED with no significant deterioration in condition.  Patient / Family / Caregiver informed of clinical course, understand medical decision-making process, and agree with plan.  The patient appears reasonably stabilized for admission considering the current resources, flow, and capabilities available in the ED at this time, and I doubt any other [facility] requiring further screening and/or treatment in the ED prior to admission.       Hurman Horn, MD 09/06/12 [phone]

## 2012-08-30 NOTE — Progress Notes (Signed)
CSW received referral for substance abuse and detox. Patient is currently being admitted to inpatient unit. CSW will inform unit csw of patient current csw needs. Per chart review, patient came to ED for 30 day detox. Unfortunately, this may have been confused with needing detox before going to a 30 day residential rehab program.   .Catha Gosselin, Theresia Majors  [phone] .08/30/2012 1604pm

## 2012-08-30 NOTE — H&P (Signed)
[facility] History and Physical  Kalyb Pemble WUJ:[id number] DOB: Oct 13, 1950 DOA: 08/30/2012  Referring physician: ER physician PCP: Cameron Sprang, FNP   Chief Complaint: alcohol intoxication  HPI:  62 year old male with past medical history of alcohol abuse, COPD, anemia who presented to ED at the referral from alcohol detox center for withdrawals. Patient's last alcoholic drink was 1 day prior to this admission. Patient reports no tremors at this time, no hallucinations, no palpitations, no sweating. No reports of chest pain, no shortness of breath, no fever, cough or chills. No nausea or vomiting and no abdominal pain. No reports of diarrhea or blood in stool or urine. In ED, patient was found to have alcohol level of 25.  Patient was started on CIWA protocol and was admitted for further evaluation and management.  Assessment and Plan:  Principal Problem:   Acute alcohol intoxication  Alcohol level 25 on this admission  CIWA protocol ordered  Continue IV fluids  SW consult for alcohol abuse Active Problems:   COPD (chronic obstructive pulmonary disease)  stable   Hyponatremia  Secondary to alcohol abuse  Continue IV fluids  Follow up BMP in am   Iron deficiency  Hemoglobin stable  No signs of acute bleed  Continue to monitor CBC Manson Passey [facility] [phone]  Review of Systems:  Constitutional: Negative for fever, chills and malaise/fatigue. Negative for diaphoresis.  HENT: Negative for hearing loss, ear pain, nosebleeds, congestion, sore throat, neck pain, tinnitus and ear discharge.   Eyes: Negative for blurred vision, double vision, photophobia, pain, discharge and redness.  Respiratory: Negative for cough, hemoptysis, sputum production, shortness of breath, wheezing and stridor.   Cardiovascular: Negative for chest pain, palpitations, orthopnea, claudication and leg swelling.  Gastrointestinal: Negative for nausea, vomiting and abdominal pain.  Negative for heartburn, constipation, blood in stool and melena.  Genitourinary: Negative for dysuria, urgency, frequency, hematuria and flank pain.  Musculoskeletal: Negative for myalgias, back pain, joint pain and falls.  Skin: Negative for itching and rash.  Neurological: Negative for dizziness and weakness. Negative for tingling, tremors, sensory change, speech change, focal weakness, loss of consciousness and headaches.  Endo/Heme/Allergies: Negative for environmental allergies and polydipsia. Does not bruise/bleed easily.  Psychiatric/Behavioral: Negative for suicidal ideas. The patient is not nervous/anxious.      Past Medical History  Diagnosis Date  . Hypertension   . Hyperlipemia   . Emphysema   . Stroke   . Chronic pain   . Cirrhosis of liver   . Fall   . Dental injury   . Bone spur of other site     Left Foot  . Impaired mobility   . Total self care deficit   . Pneumonia   . Emphysema   . ETOH abuse   . Elevated LFTs    Past Surgical History  Procedure Laterality Date  . No past surgeries    . Colonoscopy  01/06/2012    Procedure: COLONOSCOPY;  Surgeon: Theda Belfast, MD;  Location: [facility] ENDOSCOPY;  Service: Endoscopy;  Laterality: N/A;  . Esophagogastroduodenoscopy  01/06/2012    Procedure: ESOPHAGOGASTRODUODENOSCOPY (EGD);  Surgeon: Theda Belfast, MD;  Location: [facility] ENDOSCOPY;  Service: Endoscopy;  Laterality: N/A;  . Esophagogastroduodenoscopy  01/09/2012    Procedure: ESOPHAGOGASTRODUODENOSCOPY (EGD);  Surgeon: Theda Belfast, MD;  Location: [facility] ENDOSCOPY;  Service: Endoscopy;  Laterality: N/A;  . Multiple extractions with alveoloplasty  03/29/2012    Procedure: MULTIPLE EXTRACION WITH ALVEOLOPLASTY;  Surgeon: Georgia Lopes, DDS;  Location:  [facility] OR;  Service: Oral Surgery;  Laterality: Bilateral;   Social History:  reports that he has been smoking Cigarettes.  He has a 30 pack-year smoking history. He has never used smokeless tobacco. He reports that he drinks about 2.4  ounces of alcohol per week. He reports that he does not use illicit drugs.  Allergies  Allergen Reactions  . Poison Ivy Extract (Extract Of Poison Ivy) Rash    Family History: htn in parents   Prior to Admission medications   Medication Sig Start Date End Date Taking? Authorizing Provider  acamprosate (CAMPRAL) 333 MG tablet Take 2 tablets (666 mg total) by mouth 3 (three) times daily. 05/25/12  Yes Nanine Means, NP  albuterol (PROVENTIL HFA;VENTOLIN HFA) 108 (90 BASE) MCG/ACT inhaler Inhale 2 puffs into the lungs every 6 (six) hours as needed. Shortness of breath   Yes Historical Provider, MD  budesonide-formoterol (SYMBICORT) 80-4.5 MCG/ACT inhaler Inhale 2 puffs into the lungs 2 (two) times daily. For COPD. 02/04/12 02/03/13 Yes Verne Spurr, PA-C  ferrous sulfate 325 (65 FE) MG tablet Take 325 mg by mouth daily with breakfast. For low iron. 02/04/12  Yes Verne Spurr, PA-C  folic acid (FOLVITE) 1 MG tablet Take 1 mg by mouth daily. For low folate. 02/04/12  Yes Verne Spurr, PA-C  nicotine (NICODERM CQ - DOSED IN MG/24 HR) 7 mg/24hr patch Place 1 patch onto the skin daily. 05/25/12  Yes Nanine Means, NP  pravastatin (PRAVACHOL) 40 MG tablet Take 80 mg by mouth daily. To lower your cholesterol. 02/04/12  Yes Verne Spurr, PA-C  thiamine (VITAMIN B-1) 100 MG tablet Take 100 mg by mouth daily. For vitamin B deficiency. 02/04/12  Yes Verne Spurr, PA-C  traMADol (ULTRAM) 50 MG tablet Take 50 mg by mouth 2 (two) times daily as needed. For pain   Yes Historical Provider, MD  traZODone (DESYREL) 50 MG tablet Take 1 tablet (50 mg total) by mouth at bedtime. 05/25/12  Yes Nanine Means, NP  vitamin B-12 (CYANOCOBALAMIN) 100 MCG tablet Take 100 mcg by mouth daily. For low B12. 02/04/12  Yes Verne Spurr, PA-C  vitamin E 200 UNIT capsule Take 1 capsule (200 Units total) by mouth daily. 05/25/12  Yes Nanine Means, NP   Physical Exam: Filed Vitals:   08/30/12 1151  BP: 128/81  Pulse: 122  Temp:  98.9 F (37.2 C)  TempSrc: Oral  SpO2: 98%    Physical Exam  Constitutional: Appears well-developed and well-nourished. No distress.  HENT: Normocephalic. External right and left ear normal. Oropharynx is clear and moist.  Eyes: Conjunctivae and EOM are normal. PERRLA, no scleral icterus.  Neck: Normal ROM. Neck supple. No JVD. No tracheal deviation. No thyromegaly.  CVS: RRR, S1/S2 +, no murmurs, no gallops, no carotid bruit.  Pulmonary: Effort and breath sounds normal, no stridor, rhonchi, wheezes, rales.  Abdominal: Soft. BS +,  no distension, tenderness, rebound or guarding.  Musculoskeletal: Normal range of motion. No edema and no tenderness.  Lymphadenopathy: No lymphadenopathy noted, cervical, inguinal. Neuro: Alert. Normal reflexes, muscle tone coordination. No cranial nerve deficit. Skin: Skin is warm and dry. No rash noted. Not diaphoretic. No erythema. No pallor.  Psychiatric: Normal mood and affect. Behavior, judgment, thought content normal.   Labs on Admission:  Basic Metabolic Panel:  Recent Labs Lab 08/30/12 1205  NA 124*  K 3.6  CL 84*  CO2 21  GLUCOSE 93  BUN 11  CREATININE 0.96  CALCIUM 9.6   Liver Function Tests:  Recent  Labs Lab 08/30/12 1205  AST 30  ALT 15  ALKPHOS 80  BILITOT 0.7  PROT 8.4*  ALBUMIN 4.2   No results found for this basename: LIPASE, AMYLASE,  in the last 168 hours No results found for this basename: AMMONIA,  in the last 168 hours CBC:  Recent Labs Lab 08/30/12 1205  WBC 4.0  NEUTROABS 2.1  HGB 11.2*  HCT 33.1*  MCV 76.8*  PLT 164   Cardiac Enzymes: No results found for this basename: CKTOTAL, CKMB, CKMBINDEX, TROPONINI,  in the last 168 hours BNP: No components found with this basename: POCBNP,  CBG: No results found for this basename: GLUCAP,  in the last 168 hours  Radiological Exams on Admission: No results found.  EKG: Normal sinus rhythm, no ST/T wave changes  Code Status: Full Family  Communication: family not at bedside Disposition Plan: Admit for further evaluation  Manson Passey, MD  [facility] Pager [phone]  If 7PM-7AM, please contact night-coverage [email or web address] Password [facility] 08/30/2012, 1:49 PM

## 2012-08-30 NOTE — ED Notes (Signed)
Patient and belongings bags x 3 wanded by security

## 2012-08-30 NOTE — ED Notes (Signed)
Patient was sent from the [facility] because he has tested positive for alcohol each visit. Patient was discharged from their services this Am and was instructed to come to the ED for a 30 day detox treatment. Patient denies SI/HI. Patient denies visual or auditory hallucinations. Patient drank 1/2 bottle beer prior to coming tot he ED.

## 2012-08-31 DIAGNOSIS — F1020 Alcohol dependence, uncomplicated: Secondary | ICD-10-CM

## 2012-08-31 LAB — COMPREHENSIVE METABOLIC PANEL
ALT: 11 U/L (ref 0–53)
AST: 24 U/L (ref 0–37)
Albumin: 3.3 g/dL — ABNORMAL LOW (ref 3.5–5.2)
CO2: 24 mEq/L (ref 19–32)
Calcium: 8.9 mg/dL (ref 8.4–10.5)
Creatinine, Ser: 0.89 mg/dL (ref 0.50–1.35)
Sodium: 136 mEq/L (ref 135–145)
Total Protein: 6.7 g/dL (ref 6.0–8.3)

## 2012-08-31 LAB — CBC
HCT: 30.2 % — ABNORMAL LOW (ref 39.0–52.0)
Hemoglobin: 10.3 g/dL — ABNORMAL LOW (ref 13.0–17.0)
MCH: 26.6 pg (ref 26.0–34.0)
MCHC: 34.1 g/dL (ref 30.0–36.0)
MCV: 78 fL (ref 78.0–100.0)
RBC: 3.87 MIL/uL — ABNORMAL LOW (ref 4.22–5.81)

## 2012-08-31 MED ORDER — NICOTINE 7 MG/24HR TD PT24
7.0000 mg | MEDICATED_PATCH | TRANSDERMAL | Status: DC
  Filled 2012-08-31: qty 1

## 2012-08-31 MED ORDER — LORAZEPAM 1 MG PO TABS: 0.0000 mg | ORAL_TABLET | Freq: Four times a day (QID) | ORAL | Status: DC

## 2012-08-31 MED ORDER — LORAZEPAM 2 MG/ML IJ SOLN
1.0000 mg | Freq: Four times a day (QID) | INTRAMUSCULAR | Status: DC | PRN
Start: 2012-08-31 — End: 2012-09-01
  Filled 2012-08-31: qty 1

## 2012-08-31 MED ORDER — LORAZEPAM 1 MG PO TABS: 0.0000 mg | ORAL_TABLET | Freq: Two times a day (BID) | ORAL | Status: DC

## 2012-08-31 MED ORDER — PANTOPRAZOLE SODIUM 40 MG PO TBEC
40.0000 mg | DELAYED_RELEASE_TABLET | Freq: Every day | ORAL | Status: DC
  Administered 2012-08-31 – 2012-09-01 (×2): 40 mg via ORAL
  Filled 2012-08-31 (×2): qty 1

## 2012-08-31 MED ORDER — LORAZEPAM 1 MG PO TABS: 1.0000 mg | ORAL_TABLET | Freq: Four times a day (QID) | ORAL | Status: DC | PRN

## 2012-08-31 NOTE — Progress Notes (Signed)
UR completed 

## 2012-08-31 NOTE — Progress Notes (Signed)
[facility] PROGRESS NOTE  Peter [name] RUE:[id number] DOB: 01/15/1951 DOA: 08/30/2012 PCP: Cameron Sprang, FNP  Assessment/Plan: Acute alcohol intoxication  Alcohol level 25 on this admission  CIWA protocol ordered  Continue IV fluids  SW consult for alcohol abuse  COPD (chronic obstructive pulmonary disease)  Stable   Hyponatremia  Secondary to alcohol abuse  Continue IV fluids  resolved   Iron deficiency  Hemoglobin stable  No signs of acute bleed  Continue to monitor CBC   Code Status: full Family Communication: patient at bedside Disposition Plan: 2-3 days   Consultants:  none  Procedures:  none  Antibiotics:    HPI/Subjective: Feeling fine, no new c/o  Objective: Filed Vitals:   08/30/12 1629 08/30/12 1801 08/30/12 2224 08/31/12 0600  BP: 132/72 124/75 120/78 131/67  Pulse: 85 99 100 99  Temp: 98.9 F (37.2 C) 99.2 F (37.3 C) 98.4 F (36.9 C) 98.6 F (37 C)  TempSrc:  Oral Oral Oral  Resp: 20 18 20 20   Height:  5\' 7"  (1.702 m)    Weight:  64.819 kg (142 lb 14.4 oz)  65.454 kg (144 lb 4.8 oz)  SpO2: 98% 97% 95% 97%    Intake/Output Summary (Last 24 hours) at 08/31/12 0955 Last data filed at 08/31/12 0640  Gross per 24 hour  Intake    900 ml  Output   1350 ml  Net   -450 ml   Filed Weights   08/30/12 1801 08/31/12 0600  Weight: 64.819 kg (142 lb 14.4 oz) 65.454 kg (144 lb 4.8 oz)    Exam:   General:  A+Ox3, NAD  Cardiovascular: rrr  Respiratory: clear anterior  Abdomen: +BS, soft, NT  Data Reviewed: Basic Metabolic Panel:  Recent Labs Lab 08/30/12 1205 08/30/12 1830 08/31/12 0505  NA 124* 130* 136  K 3.6 4.2 3.9  CL 84* 93* 101  CO2 21 25 24   GLUCOSE 93 89 84  BUN 11 10 9   CREATININE 0.96 0.88 0.89  CALCIUM 9.6 9.0 8.9  MG  --  1.7  --   PHOS  --  3.4  --    Liver Function Tests:  Recent Labs Lab 08/30/12 1205 08/30/12 1830 08/31/12 0505  AST 30 26 24   ALT 15 13 11   ALKPHOS 80 69 62   BILITOT 0.7 0.6 0.6  PROT 8.4* 7.3 6.7  ALBUMIN 4.2 3.6 3.3*   No results found for this basename: LIPASE, AMYLASE,  in the last 168 hours No results found for this basename: AMMONIA,  in the last 168 hours CBC:  Recent Labs Lab 08/30/12 1205 08/30/12 1830 08/31/12 0505  WBC 4.0 4.9 3.5*  NEUTROABS 2.1 2.4  --   HGB 11.2* 10.6* 10.3*  HCT 33.1* 31.0* 30.2*  MCV 76.8* 77.1* 78.0  PLT 164 158 151   Cardiac Enzymes: No results found for this basename: CKTOTAL, CKMB, CKMBINDEX, TROPONINI,  in the last 168 hours BNP (last 3 results) No results found for this basename: PROBNP,  in the last 8760 hours CBG:  Recent Labs Lab 08/31/12 0726  GLUCAP 85    No results found for this or any previous visit (from the past 240 hour(s)).   Studies: No results found.  Scheduled Meds: . acamprosate  666 mg Oral TID WC  . budesonide-formoterol  2 puff Inhalation BID  . ferrous sulfate  325 mg Oral Q breakfast  . folic acid  1 mg Oral Daily  . LORazepam  0-4 mg Oral Q6H  Followed by  . [START ON 09/02/2012] LORazepam  0-4 mg Oral Q12H  . nicotine  7 mg Transdermal Q24H  . simvastatin  20 mg Oral q1800  . sodium chloride  3 mL Intravenous Q12H  . thiamine  100 mg Oral Daily  . traZODone  50 mg Oral QHS  . vitamin B-12  100 mcg Oral Daily  . vitamin E  200 Units Oral Daily   Continuous Infusions: . sodium chloride 75 mL/hr at 08/31/12 8657    Principal Problem:   Acute alcohol intoxication Active Problems:   COPD (chronic obstructive pulmonary disease)   Hyponatremia   Iron deficiency    Time spent: 25    Marlin Canary  [facility] Pager [phone]. If 8PM-8AM, please contact night-coverage at [email or web address], password [facility] 08/31/2012, 9:55 AM  LOS: 1 day

## 2012-08-31 NOTE — Evaluation (Signed)
Physical Therapy Evaluation Patient Details Name: Peter [name] MRN: [id number] DOB: February 22, 1951 Today's Date: 08/31/2012 Time: 4098-1191 PT Time Calculation (min): 9 min  PT Assessment / Plan / Recommendation Clinical Impression  Pt admitted with acute alcohol intoxication.  Pt supervision level for mobility at this time and states he plans to d/c home with brother however per chart may d/c to residential rehab program.  Pt encouraged to ambulate with staff during acute stay.    PT Assessment  Patent does not need any further PT services    Follow Up Recommendations  No PT follow up    Does the patient have the potential to tolerate intense rehabilitation      Barriers to Discharge        Equipment Recommendations  None recommended by PT    Recommendations for Other Services     Frequency      Precautions / Restrictions Precautions Precautions: Fall   Pertinent Vitals/Pain No pain, denies dizziness and SOB during ambulation     Mobility  Bed Mobility Bed Mobility: Supine to Sit;Sit to Supine Supine to Sit: 5: Supervision Sit to Supine: 5: Supervision Details for Bed Mobility Assistance: supervision for IV Transfers Transfers: Stand to Sit;Sit to Stand Sit to Stand: 5: Supervision;From bed Stand to Sit: 5: Supervision;To bed Details for Transfer Assistance: supervision for IV Ambulation/Gait Ambulation/Gait Assistance: 5: Supervision Ambulation Distance (Feet): 300 Feet Assistive device: Straight cane Ambulation/Gait Assistance Details: pt with unsteadiness but no true LOB, uses SPC appropriately, pt reports his ambulation has been improving and no worse than PTA Gait Pattern: Decreased stride length;Step-through pattern Gait velocity: decreased    Exercises     PT Diagnosis:    PT Problem List:   PT Treatment Interventions:     PT Goals    Visit Information  Last PT Received On: 08/31/12 Assistance Needed: +1    Subjective Data  Subjective: I like it  when he leaves (brother home "99.9% of the time")   Prior Functioning  Home Living Lives With: Other (Comment) (brother) Home Access: Level entry Home Layout: One level Home Adaptive Equipment: Walker - rolling;Straight cane Prior Function Level of Independence: Independent with assistive device(s) Comments: pt reports using the SPC just in case his legs get weak "to keep from falling" Communication Communication: No difficulties    Cognition  Cognition Overall Cognitive Status: Appears within functional limits for tasks assessed/performed Arousal/Alertness: Awake/alert Orientation Level: Appears intact for tasks assessed Behavior During Session: [facility] for tasks performed    Extremity/Trunk Assessment Right Upper Extremity Assessment RUE ROM/Strength/Tone: [facility] for tasks assessed Left Upper Extremity Assessment LUE ROM/Strength/Tone: [facility] for tasks assessed Right Lower Extremity Assessment RLE ROM/Strength/Tone: [facility] for tasks assessed Left Lower Extremity Assessment LLE ROM/Strength/Tone: [facility] for tasks assessed   Balance    End of Session PT - End of Session Activity Tolerance: Patient tolerated treatment well Patient left: in bed;with call bell/phone within reach;with bed alarm set;with family/visitor present  GP     Carah Barrientes,KATHrine E 08/31/2012, 10:47 AM Zenovia Jarred, PT, DPT 08/31/2012 Pager: [phone]

## 2012-09-01 MED ORDER — LORAZEPAM 1 MG PO TABS: 1.0000 mg | ORAL_TABLET | Freq: Three times a day (TID) | ORAL | Status: DC | PRN

## 2012-09-01 MED ORDER — ONE-DAILY MULTI VITAMINS PO TABS: 1.0000 | ORAL_TABLET | Freq: Every day | ORAL | Status: DC

## 2012-09-01 NOTE — Discharge Summary (Signed)
Physician Discharge Summary  Peter [name] UUV:[id number] DOB: 1950-11-07 DOA: 08/30/2012  PCP: Cameron Sprang, FNP  Admit date: 08/30/2012 Discharge date: 09/01/2012  Time spent: 40 minutes  Recommendations for Outpatient Follow-up:  Home with referral to outpatient alcohol rehabilitation program  Discharge Diagnoses:  Principal Problem:   Acute alcohol intoxication  Active Problems:   COPD (chronic obstructive pulmonary disease)   Hyponatremia   Iron deficiency   Discharge Condition: Fair  Diet recommendation: Regular  Filed Weights   08/30/12 1801 08/31/12 0600 09/01/12 0505  Weight: 64.819 kg (142 lb 14.4 oz) 65.454 kg (144 lb 4.8 oz) 65.6 kg (144 lb 10 oz)    History of present illness:  62 year old male with history of alcohol abuse, COPD, anemia was referred from alcohol detox center for symptoms of withdrawal and alcohol intoxication. The ED was found to have an alkaline 25 stop was started on CIWA protocol and admitted for further management.  Hospital Course:  Acute alcohol intoxication The patient started on CIWA protocol. Continued on IV fluids. Does not have any withdrawal symptoms at present and has not required much Ativan as well. Social worker discussed with patient and provided with outpatient resources. Patient is on acamprosate for alcohol withdrawal as outpatient and can continue. I will prescribe a few doses of Ativan as needed for any withdrawal symptoms. Counseled strongly on alcohol cessation and educated on the health and social intact associated.  COPD Stable continue home medication  Hyponatremia Likely secondary to alcohol abuse. Improved with IV hydration  Patient clinically stable and can be discharged home with outpatient followup.   Procedures:  None  Consultations:  None  Discharge Exam: Filed Vitals:   08/31/12 1350 08/31/12 2115 09/01/12 0505 09/01/12 0915  BP: 124/72 120/73 131/77 108/69  Pulse: 88 79 75 109  Temp: 98.2  F (36.8 C) 98.4 F (36.9 C) 98.6 F (37 C)   TempSrc: Oral Oral Oral   Resp: 18 18 18    Height:      Weight:   65.6 kg (144 lb 10 oz)   SpO2: 97% 96% 98%     General: Middle aged male in no acute distress HEENT: No pallor, moist oral mucosa Cardiovascular: Normal S1 and S2, no murmurs rub or gallop Respiratory: Ear to auscultation bilaterally no added sounds Abdomen: Soft, nontender, nondistended, bowel sounds present Extremities:, No edema CNS: AAO x3, no tremors Discharge Instructions     Medication List    TAKE these medications       acamprosate 333 MG tablet  Commonly known as:  CAMPRAL  Take 2 tablets (666 mg total) by mouth 3 (three) times daily.     albuterol 108 (90 BASE) MCG/ACT inhaler  Commonly known as:  PROVENTIL HFA;VENTOLIN HFA  Inhale 2 puffs into the lungs every 6 (six) hours as needed. Shortness of breath     budesonide-formoterol 80-4.5 MCG/ACT inhaler  Commonly known as:  SYMBICORT  Inhale 2 puffs into the lungs 2 (two) times daily. For COPD.     ferrous sulfate 325 (65 FE) MG tablet  Take 325 mg by mouth daily with breakfast. For low iron.     folic acid 1 MG tablet  Commonly known as:  FOLVITE  Take 1 mg by mouth daily. For low folate.     LORazepam 1 MG tablet  Commonly known as:  ATIVAN  Take 1 tablet (1 mg total) by mouth every 8 (eight) hours as needed for anxiety.     multivitamin tablet  Take  1 tablet by mouth daily.     nicotine 7 mg/24hr patch  Commonly known as:  NICODERM CQ - dosed in mg/24 hr  Place 1 patch onto the skin daily.     pravastatin 40 MG tablet  Commonly known as:  PRAVACHOL  Take 80 mg by mouth daily. To lower your cholesterol.     thiamine 100 MG tablet  Commonly known as:  VITAMIN B-1  Take 100 mg by mouth daily. For vitamin B deficiency.     traMADol 50 MG tablet  Commonly known as:  ULTRAM  Take 50 mg by mouth 2 (two) times daily as needed. For pain     traZODone 50 MG tablet  Commonly known as:   DESYREL  Take 1 tablet (50 mg total) by mouth at bedtime.     vitamin B-12 100 MCG tablet  Commonly known as:  CYANOCOBALAMIN  Take 100 mcg by mouth daily. For low B12.     vitamin E 200 UNIT capsule  Take 1 capsule (200 Units total) by mouth daily.           Follow-up Information   Follow up with Cameron Sprang, FNP In 1 week.   Contact information:   [address] [place] [address] [phone]        The results of significant diagnostics from this hospitalization (including imaging, microbiology, ancillary and laboratory) are listed below for reference.    Significant Diagnostic Studies: No results found.  Microbiology: No results found for this or any previous visit (from the past 240 hour(s)).   Labs: Basic Metabolic Panel:  Recent Labs Lab 08/30/12 1205 08/30/12 1830 08/31/12 0505  NA 124* 130* 136  K 3.6 4.2 3.9  CL 84* 93* 101  CO2 21 25 24   GLUCOSE 93 89 84  BUN 11 10 9   CREATININE 0.96 0.88 0.89  CALCIUM 9.6 9.0 8.9  MG  --  1.7  --   PHOS  --  3.4  --    Liver Function Tests:  Recent Labs Lab 08/30/12 1205 08/30/12 1830 08/31/12 0505  AST 30 26 24   ALT 15 13 11   ALKPHOS 80 69 62  BILITOT 0.7 0.6 0.6  PROT 8.4* 7.3 6.7  ALBUMIN 4.2 3.6 3.3*   No results found for this basename: LIPASE, AMYLASE,  in the last 168 hours No results found for this basename: AMMONIA,  in the last 168 hours CBC:  Recent Labs Lab 08/30/12 1205 08/30/12 1830 08/31/12 0505  WBC 4.0 4.9 3.5*  NEUTROABS 2.1 2.4  --   HGB 11.2* 10.6* 10.3*  HCT 33.1* 31.0* 30.2*  MCV 76.8* 77.1* 78.0  PLT 164 158 151   Cardiac Enzymes:  Recent Labs Lab 08/31/12 1230  TROPONINI <0.30   BNP: BNP (last 3 results) No results found for this basename: PROBNP,  in the last 8760 hours CBG:  Recent Labs Lab 08/31/12 0726 09/01/12 0718  GLUCAP 85 96       Signed:  Aili Casillas  [facility] 09/01/2012, 10:46 AM

## 2012-09-01 NOTE — Progress Notes (Signed)
Pt discharged to home. DC instructions given to patient's nurse and patient with male family member at bedside. Prescription given for ativan and multivitamin. Patient's nurse received discharge information and also signed discharge document per patient's request. Left unit in wheelchair pushed by charge rn, cynthia. Left in good condition. Vwilliams,rn.

## 2012-12-06 ENCOUNTER — Ambulatory Visit: Payer: Self-pay

## 2012-12-17 ENCOUNTER — Ambulatory Visit: Payer: Self-pay

## 2013-01-03 ENCOUNTER — Telehealth (HOSPITAL_COMMUNITY): Payer: Self-pay | Admitting: *Deleted

## 2013-01-03 NOTE — Telephone Encounter (Signed)
Received two VM from Blenda Bridegroom requesting refills for patient. Patient not seen by any provider in this clinic. No medication prescribed by any provider in this clinic for this patient. Contacted pt [phone]. Identified self as RN from [facility] OP office. Informed pt that no provider in this office able to write prescriptions for him as he is not a patient at this time. Patient asked several times which office I was calling from. Patient states Bonita Quin not there, but that he did not call. States he gets his medicine from [facility]. Stated that this office unable to fill medication requested by Ms. Smith. Patient stated again that he gets his medicine from [facility].

## 2013-01-12 ENCOUNTER — Encounter (HOSPITAL_COMMUNITY): Payer: Self-pay | Admitting: *Deleted

## 2013-01-12 ENCOUNTER — Emergency Department (HOSPITAL_COMMUNITY): Payer: Medicaid Other

## 2013-01-12 ENCOUNTER — Inpatient Hospital Stay (HOSPITAL_COMMUNITY)
Admission: EM | Admit: 2013-01-12 | Discharge: 2013-01-19 | DRG: 896 | Disposition: A | Discharge status: Home Health | Payer: Medicaid Other | Attending: Family Medicine | Admitting: Family Medicine

## 2013-01-12 DIAGNOSIS — F10929 Alcohol use, unspecified with intoxication, unspecified: Secondary | ICD-10-CM | POA: Diagnosis present

## 2013-01-12 DIAGNOSIS — I6529 Occlusion and stenosis of unspecified carotid artery: Secondary | ICD-10-CM | POA: Diagnosis not present

## 2013-01-12 DIAGNOSIS — I951 Orthostatic hypotension: Secondary | ICD-10-CM | POA: Diagnosis not present

## 2013-01-12 DIAGNOSIS — R55 Syncope and collapse: Secondary | ICD-10-CM | POA: Diagnosis present

## 2013-01-12 DIAGNOSIS — F10931 Alcohol use, unspecified with withdrawal delirium: Secondary | ICD-10-CM | POA: Diagnosis present

## 2013-01-12 DIAGNOSIS — F411 Generalized anxiety disorder: Secondary | ICD-10-CM | POA: Diagnosis present

## 2013-01-12 DIAGNOSIS — K7030 Alcoholic cirrhosis of liver without ascites: Secondary | ICD-10-CM | POA: Diagnosis present

## 2013-01-12 DIAGNOSIS — R5381 Other malaise: Secondary | ICD-10-CM | POA: Diagnosis present

## 2013-01-12 DIAGNOSIS — F10230 Alcohol dependence with withdrawal, uncomplicated: Secondary | ICD-10-CM

## 2013-01-12 DIAGNOSIS — J449 Chronic obstructive pulmonary disease, unspecified: Secondary | ICD-10-CM

## 2013-01-12 DIAGNOSIS — F10920 Alcohol use, unspecified with intoxication, uncomplicated: Secondary | ICD-10-CM

## 2013-01-12 DIAGNOSIS — Z8673 Personal history of transient ischemic attack (TIA), and cerebral infarction without residual deficits: Secondary | ICD-10-CM

## 2013-01-12 DIAGNOSIS — F17200 Nicotine dependence, unspecified, uncomplicated: Secondary | ICD-10-CM | POA: Diagnosis present

## 2013-01-12 DIAGNOSIS — F1010 Alcohol abuse, uncomplicated: Secondary | ICD-10-CM

## 2013-01-12 DIAGNOSIS — E872 Acidosis, unspecified: Secondary | ICD-10-CM

## 2013-01-12 DIAGNOSIS — E871 Hypo-osmolality and hyponatremia: Secondary | ICD-10-CM

## 2013-01-12 DIAGNOSIS — I498 Other specified cardiac arrhythmias: Secondary | ICD-10-CM | POA: Diagnosis present

## 2013-01-12 DIAGNOSIS — N390 Urinary tract infection, site not specified: Secondary | ICD-10-CM | POA: Diagnosis present

## 2013-01-12 DIAGNOSIS — R509 Fever, unspecified: Secondary | ICD-10-CM

## 2013-01-12 DIAGNOSIS — G8929 Other chronic pain: Secondary | ICD-10-CM | POA: Diagnosis present

## 2013-01-12 DIAGNOSIS — E611 Iron deficiency: Secondary | ICD-10-CM

## 2013-01-12 DIAGNOSIS — D649 Anemia, unspecified: Secondary | ICD-10-CM | POA: Diagnosis not present

## 2013-01-12 DIAGNOSIS — F0390 Unspecified dementia without behavioral disturbance: Secondary | ICD-10-CM | POA: Diagnosis present

## 2013-01-12 DIAGNOSIS — E876 Hypokalemia: Secondary | ICD-10-CM

## 2013-01-12 DIAGNOSIS — H9190 Unspecified hearing loss, unspecified ear: Secondary | ICD-10-CM | POA: Diagnosis present

## 2013-01-12 DIAGNOSIS — Z79899 Other long term (current) drug therapy: Secondary | ICD-10-CM

## 2013-01-12 DIAGNOSIS — J4489 Other specified chronic obstructive pulmonary disease: Secondary | ICD-10-CM

## 2013-01-12 DIAGNOSIS — M129 Arthropathy, unspecified: Secondary | ICD-10-CM | POA: Diagnosis present

## 2013-01-12 DIAGNOSIS — F10229 Alcohol dependence with intoxication, unspecified: Principal | ICD-10-CM | POA: Diagnosis present

## 2013-01-12 DIAGNOSIS — F10239 Alcohol dependence with withdrawal, unspecified: Secondary | ICD-10-CM

## 2013-01-12 DIAGNOSIS — J438 Other emphysema: Secondary | ICD-10-CM | POA: Diagnosis present

## 2013-01-12 DIAGNOSIS — Z9181 History of falling: Secondary | ICD-10-CM

## 2013-01-12 DIAGNOSIS — D6959 Other secondary thrombocytopenia: Secondary | ICD-10-CM | POA: Diagnosis present

## 2013-01-12 DIAGNOSIS — K7460 Unspecified cirrhosis of liver: Secondary | ICD-10-CM

## 2013-01-12 DIAGNOSIS — F1020 Alcohol dependence, uncomplicated: Secondary | ICD-10-CM | POA: Diagnosis present

## 2013-01-12 DIAGNOSIS — J690 Pneumonitis due to inhalation of food and vomit: Secondary | ICD-10-CM | POA: Diagnosis present

## 2013-01-12 DIAGNOSIS — F10921 Alcohol use, unspecified with intoxication delirium: Secondary | ICD-10-CM

## 2013-01-12 DIAGNOSIS — D509 Iron deficiency anemia, unspecified: Secondary | ICD-10-CM | POA: Diagnosis present

## 2013-01-12 DIAGNOSIS — E785 Hyperlipidemia, unspecified: Secondary | ICD-10-CM | POA: Diagnosis present

## 2013-01-12 DIAGNOSIS — I10 Essential (primary) hypertension: Secondary | ICD-10-CM | POA: Diagnosis present

## 2013-01-12 DIAGNOSIS — D72819 Decreased white blood cell count, unspecified: Secondary | ICD-10-CM | POA: Diagnosis present

## 2013-01-12 DIAGNOSIS — F10231 Alcohol dependence with withdrawal delirium: Secondary | ICD-10-CM | POA: Diagnosis present

## 2013-01-12 LAB — POCT I-STAT, CHEM 8
Calcium, Ion: 1.05 mmol/L — ABNORMAL LOW (ref 1.12–1.23)
Chloride: 100 mEq/L (ref 96–112)
Creatinine, Ser: 1.1 mg/dL (ref 0.50–1.35)
Glucose, Bld: 79 mg/dL (ref 70–99)
HCT: 37 % — ABNORMAL LOW (ref 39.0–52.0)
Potassium: 4.3 mEq/L (ref 3.5–5.1)

## 2013-01-12 LAB — MAGNESIUM: Magnesium: 1.9 mg/dL (ref 1.5–2.5)

## 2013-01-12 LAB — URINALYSIS, ROUTINE W REFLEX MICROSCOPIC
Bilirubin Urine: NEGATIVE
Ketones, ur: NEGATIVE mg/dL
Nitrite: NEGATIVE
Specific Gravity, Urine: 1.011 (ref 1.005–1.030)
Urobilinogen, UA: 0.2 mg/dL (ref 0.0–1.0)

## 2013-01-12 LAB — CBC
MCH: 26.8 pg (ref 26.0–34.0)
MCHC: 34.2 g/dL (ref 30.0–36.0)
MCV: 78.4 fL (ref 78.0–100.0)
Platelets: 214 10*3/uL (ref 150–400)
RBC: 4.03 MIL/uL — ABNORMAL LOW (ref 4.22–5.81)

## 2013-01-12 LAB — COMPREHENSIVE METABOLIC PANEL
Alkaline Phosphatase: 100 U/L (ref 39–117)
BUN: 6 mg/dL (ref 6–23)
Calcium: 8.8 mg/dL (ref 8.4–10.5)
GFR calc Af Amer: >90 mL/min (ref >90)
Glucose, Bld: 77 mg/dL (ref 70–99)
Total Protein: 8 g/dL (ref 6.0–8.3)

## 2013-01-12 LAB — POCT I-STAT TROPONIN I

## 2013-01-12 LAB — ETHANOL: Alcohol, Ethyl (B): 256 mg/dL — ABNORMAL HIGH (ref 0–11)

## 2013-01-12 LAB — URINE MICROSCOPIC-ADD ON

## 2013-01-12 MED ORDER — FERROUS SULFATE 325 (65 FE) MG PO TABS
325.0000 mg | ORAL_TABLET | Freq: Every day | ORAL | Status: DC
  Administered 2013-01-13 – 2013-01-14 (×2): 325 mg via ORAL
  Filled 2013-01-12 (×4): qty 1

## 2013-01-12 MED ORDER — ONE-DAILY MULTI VITAMINS PO TABS: 1.0000 | ORAL_TABLET | Freq: Every day | ORAL | Status: DC

## 2013-01-12 MED ORDER — FOLIC ACID 1 MG PO TABS
1.0000 mg | ORAL_TABLET | Freq: Every day | ORAL | Status: DC
  Administered 2013-01-13 – 2013-01-19 (×5): 1 mg via ORAL
  Filled 2013-01-12 (×7): qty 1

## 2013-01-12 MED ORDER — ADULT MULTIVITAMIN W/MINERALS CH
1.0000 | ORAL_TABLET | Freq: Every day | ORAL | Status: DC
  Administered 2013-01-13 – 2013-01-19 (×5): 1 via ORAL
  Filled 2013-01-12 (×7): qty 1

## 2013-01-12 MED ORDER — SODIUM CHLORIDE 0.9 % IJ SOLN
3.0000 mL | Freq: Two times a day (BID) | INTRAMUSCULAR | Status: DC
  Administered 2013-01-13 – 2013-01-17 (×4): 3 mL via INTRAVENOUS
  Administered 2013-01-17 – 2013-01-18 (×2): 10 mL via INTRAVENOUS

## 2013-01-12 MED ORDER — SODIUM CHLORIDE 0.9 % IV SOLN
INTRAVENOUS | Status: DC
  Administered 2013-01-12 – 2013-01-13 (×4): via INTRAVENOUS

## 2013-01-12 MED ORDER — ONDANSETRON HCL 4 MG/2ML IJ SOLN
4.0000 mg | Freq: Four times a day (QID) | INTRAMUSCULAR | Status: DC | PRN
  Administered 2013-01-15: 4 mg via INTRAVENOUS
  Filled 2013-01-12: qty 2

## 2013-01-12 MED ORDER — LORAZEPAM 1 MG PO TABS
0.0000 mg | ORAL_TABLET | Freq: Four times a day (QID) | ORAL | Status: AC
  Administered 2013-01-12 – 2013-01-14 (×5): 1 mg via ORAL
  Administered 2013-01-14: 2 mg via ORAL
  Administered 2013-01-14: 1 mg via ORAL
  Filled 2013-01-12: qty 1
  Filled 2013-01-12: qty 2
  Filled 2013-01-12: qty 4
  Filled 2013-01-12 (×2): qty 1

## 2013-01-12 MED ORDER — BUDESONIDE-FORMOTEROL FUMARATE 80-4.5 MCG/ACT IN AERO
2.0000 | INHALATION_SPRAY | Freq: Two times a day (BID) | RESPIRATORY_TRACT | Status: DC
  Administered 2013-01-12 – 2013-01-19 (×12): 2 via RESPIRATORY_TRACT
  Filled 2013-01-12 (×3): qty 6.9

## 2013-01-12 MED ORDER — LORAZEPAM 1 MG PO TABS
0.0000 mg | ORAL_TABLET | Freq: Two times a day (BID) | ORAL | Status: DC
  Administered 2013-01-14 – 2013-01-15 (×2): 4 mg via ORAL
  Filled 2013-01-12: qty 4

## 2013-01-12 MED ORDER — TRAMADOL HCL 50 MG PO TABS
50.0000 mg | ORAL_TABLET | Freq: Two times a day (BID) | ORAL | Status: DC | PRN
  Filled 2013-01-12: qty 1

## 2013-01-12 MED ORDER — LORAZEPAM 1 MG PO TABS
1.0000 mg | ORAL_TABLET | Freq: Four times a day (QID) | ORAL | Status: DC | PRN
  Administered 2013-01-12 – 2013-01-14 (×3): 1 mg via ORAL
  Filled 2013-01-12 (×6): qty 1

## 2013-01-12 MED ORDER — BRIMONIDINE TARTRATE 0.2 % OP SOLN
1.0000 [drp] | Freq: Two times a day (BID) | OPHTHALMIC | Status: DC
  Administered 2013-01-12 – 2013-01-19 (×14): 1 [drp] via OPHTHALMIC
  Filled 2013-01-12 (×2): qty 5

## 2013-01-12 MED ORDER — TRAZODONE HCL 50 MG PO TABS
50.0000 mg | ORAL_TABLET | Freq: Every day | ORAL | Status: DC
  Administered 2013-01-12 – 2013-01-14 (×3): 50 mg via ORAL
  Filled 2013-01-12 (×4): qty 1

## 2013-01-12 MED ORDER — ALBUTEROL SULFATE HFA 108 (90 BASE) MCG/ACT IN AERS
2.0000 | INHALATION_SPRAY | Freq: Four times a day (QID) | RESPIRATORY_TRACT | Status: DC | PRN
  Filled 2013-01-12: qty 6.7

## 2013-01-12 MED ORDER — ALUM & MAG HYDROXIDE-SIMETH 200-200-20 MG/5ML PO SUSP: 30.0000 mL | Freq: Four times a day (QID) | ORAL | Status: DC | PRN

## 2013-01-12 MED ORDER — VITAMIN B-1 100 MG PO TABS
100.0000 mg | ORAL_TABLET | Freq: Every day | ORAL | Status: DC
  Administered 2013-01-13 – 2013-01-14 (×2): 100 mg via ORAL
  Filled 2013-01-12 (×3): qty 1

## 2013-01-12 MED ORDER — VITAMIN B-12 100 MCG PO TABS
100.0000 ug | ORAL_TABLET | Freq: Every day | ORAL | Status: DC
  Administered 2013-01-13 – 2013-01-19 (×5): 100 ug via ORAL
  Filled 2013-01-12 (×7): qty 1

## 2013-01-12 MED ORDER — LORAZEPAM 2 MG/ML IJ SOLN
1.0000 mg | Freq: Four times a day (QID) | INTRAMUSCULAR | Status: DC | PRN
  Filled 2013-01-12 (×3): qty 1

## 2013-01-12 MED ORDER — ONDANSETRON HCL 4 MG PO TABS: 4.0000 mg | ORAL_TABLET | Freq: Four times a day (QID) | ORAL | Status: DC | PRN

## 2013-01-12 NOTE — H&P (Addendum)
[facility] History and Physical  [name] ZOX:[id number] DOB: 11/29/1950 DOA: 01/12/2013  Referring physician: EDP PCP: Cameron Sprang, FNP    Chief Complaint: "falling out"  HPI: Peter [name] is a 62 y.o. male alcoholic who was brought to the emergency room by his caregiver with multiple episodes of near syncope. He reports that when he stands up, he feels weak and nearly loses consciousness. He has not injured himself. He has had no fevers or chills, no chest pain, cough, palpitations, shortness of breath. He vomits periodically but denies any hematemesis or melena. No hematochezia. His blood alcohol level was found to be 256. Venous lactic acid 3.9, but no evidence of infection found. Afebrile. CT brain shows nothing acute. Patient only eats once a day. He reports drinking 2 40 ounce malt liquor bottles per day, but his caregiver implies he drinks much more heavily. Last drink was prior to coming to the emergency room. He does have a history of alcohol withdrawal.  Review of Systems: Systems reviewed and as above otherwise negative.  Past Medical History  Diagnosis Date  . Hypertension   . Hyperlipemia   . Emphysema   . Stroke   . Chronic pain   . Cirrhosis of liver   . Fall   . Dental injury   . Bone spur of other site     Left Foot  . Impaired mobility   . Total self care deficit   . Pneumonia   . Emphysema   . ETOH abuse   . Elevated LFTs   . Arthritis   . Anxiety   . Sinusitis   . Anemia     iron def.  . Hard of hearing    Past Surgical History  Procedure Laterality Date  . No past surgeries    . Colonoscopy  01/06/2012    Procedure: COLONOSCOPY;  Surgeon: Theda Belfast, MD;  Location: [facility] ENDOSCOPY;  Service: Endoscopy;  Laterality: N/A;  . Esophagogastroduodenoscopy  01/06/2012    Procedure: ESOPHAGOGASTRODUODENOSCOPY (EGD);  Surgeon: Theda Belfast, MD;  Location: [facility] ENDOSCOPY;  Service: Endoscopy;  Laterality: N/A;  . Esophagogastroduodenoscopy   01/09/2012    Procedure: ESOPHAGOGASTRODUODENOSCOPY (EGD);  Surgeon: Theda Belfast, MD;  Location: [facility] ENDOSCOPY;  Service: Endoscopy;  Laterality: N/A;  . Multiple extractions with alveoloplasty  03/29/2012    Procedure: MULTIPLE EXTRACION WITH ALVEOLOPLASTY;  Surgeon: Georgia Lopes, DDS;  Location: [facility] OR;  Service: Oral Surgery;  Laterality: Bilateral;  . Bone spur  2013    left spur   Social History:  reports that he has been smoking Cigarettes.  He has a 60 pack-year smoking history. He has never used smokeless tobacco. He reports that he drinks about 2.4 ounces of alcohol per week. He reports that he does not use illicit drugs. he no longer drives. His cousin is his caregiver   Allergies  Allergen Reactions  . Poison Ivy Extract (Extract Of Poison Ivy) Rash   Family history: Mother still living and has thyroid problems   Prior to Admission medications   Medication Sig Start Date End Date Taking? Authorizing Provider  acamprosate (CAMPRAL) 333 MG tablet Take 2 tablets (666 mg total) by mouth 3 (three) times daily. 05/25/12  Yes Nanine Means, NP  albuterol (PROVENTIL HFA;VENTOLIN HFA) 108 (90 BASE) MCG/ACT inhaler Inhale 2 puffs into the lungs every 6 (six) hours as needed. Shortness of breath   Yes Historical Provider, MD  atorvastatin (LIPITOR) 80 MG tablet Take 80 mg by mouth daily.  Yes Historical Provider, MD  brimonidine (ALPHAGAN) 0.2 % ophthalmic solution Place 1 drop into both eyes 2 (two) times daily.   Yes Historical Provider, MD  budesonide-formoterol (SYMBICORT) 80-4.5 MCG/ACT inhaler Inhale 2 puffs into the lungs 2 (two) times daily. For COPD. 02/04/12 02/03/13 Yes Verne Spurr, PA-C  ferrous sulfate 325 (65 FE) MG tablet Take 325 mg by mouth daily with breakfast. For low iron. 02/04/12  Yes Verne Spurr, PA-C  folic acid (FOLVITE) 1 MG tablet Take 1 mg by mouth daily. For low folate. 02/04/12  Yes Neil Mashburn, PA-C  LORazepam (ATIVAN) 1 MG tablet Take 1 tablet (1 mg total)  by mouth every 8 (eight) hours as needed for anxiety. 09/01/12  Yes Nishant Dhungel, MD  Multiple Vitamin (MULTIVITAMIN) tablet Take 1 tablet by mouth daily. 09/01/12  Yes Nishant Dhungel, MD  nicotine (NICODERM CQ - DOSED IN MG/24 HR) 7 mg/24hr patch Place 1 patch onto the skin daily. 05/25/12  Yes Nanine Means, NP  thiamine (VITAMIN B-1) 100 MG tablet Take 100 mg by mouth daily. For vitamin B deficiency. 02/04/12  Yes Verne Spurr, PA-C  traMADol (ULTRAM) 50 MG tablet Take 50 mg by mouth 2 (two) times daily as needed. For pain   Yes Historical Provider, MD  traZODone (DESYREL) 50 MG tablet Take 1 tablet (50 mg total) by mouth at bedtime. 05/25/12  Yes Nanine Means, NP  vitamin B-12 (CYANOCOBALAMIN) 100 MCG tablet Take 100 mcg by mouth daily. For low B12. 02/04/12  Yes Verne Spurr, PA-C  vitamin E 200 UNIT capsule Take 1 capsule (200 Units total) by mouth daily. 05/25/12  Yes Nanine Means, NP   Physical Exam: Filed Vitals:   01/12/13 1417 01/12/13 1736 01/12/13 1744  BP: 114/73 129/84 129/84  Pulse: 105 100 93  Temp: 98.8 F (37.1 C)    TempSrc: Oral    Resp: 20 19   SpO2: 95% 97%    BP 156/84  Pulse 116  Temp(Src) 99.2 F (37.3 C) (Oral)  Resp 18  Ht 5\' 4"  (1.626 m)  Wt 62.1 kg (136 lb 14.5 oz)  BMI 23.49 kg/m2  SpO2 97%  General Appearance:    Alert, cooperative, chronically ill appearing. Appropriate and oriented. Appears older than stated age.   Head:    Normocephalic, without obvious abnormality, atraumatic  Eyes:    PERRL, conjunctiva/corneas clear, EOM's intact, fundi    benign, both eyes       Ears:    Normal TM's and external ear canals, both ears  Nose:   Nares normal, septum midline, mucosa normal, no drainage   or sinus tenderness  Throat:   moist mucous membranes. Edentulous.   Neck:   Supple, symmetrical, trachea midline, no adenopathy;       thyroid:  No enlargement/tenderness/nodules; no carotid   bruit or JVD  Back:     Symmetric, no curvature, ROM normal, no  CVA tenderness  Lungs:     Clear to auscultation bilaterally, respirations unlabored  Chest wall:    No tenderness or deformity  Heart:    Regular rate and rhythm, S1 and S2 normal, no murmur, rub   or gallop  Abdomen:     Soft, non-tender, bowel sounds active all four quadrants,    no masses, no organomegaly  Genitalia:   deferred. Adult diapers  Rectal:   deferred   Extremities:   Extremities normal, atraumatic, no cyanosis or edema  Pulses:   2+ and symmetric all extremities  Skin:  Skin color, texture, turgor normal, no rashes or lesions  Lymph nodes:   Cervical, supraclavicular, and axillary nodes normal  Neurologic:   CNII-XII intact. Normal strength, sensation and reflexes      Throughout. slightly tremulous. No asterixis     psychiatric: Calm and cooperative.  Labs on Admission:  Basic Metabolic Panel:  Recent Labs Lab 01/12/13 1610 01/12/13 1628  NA 135 134*  K 4.2 4.3  CL 94* 100  CO2 19  --   GLUCOSE 77 79  BUN 6 4*  CREATININE 0.66 1.10  CALCIUM 8.8  --   MG 1.9  --    Liver Function Tests:  Recent Labs Lab 01/12/13 1610  AST 106*  ALT 49  ALKPHOS 100  BILITOT 0.3  PROT 8.0  ALBUMIN 3.8   No results found for this basename: LIPASE, AMYLASE,  in the last 168 hours No results found for this basename: AMMONIA,  in the last 168 hours CBC:  Recent Labs Lab 01/12/13 1612 01/12/13 1628  WBC 2.6*  --   HGB 10.8* 12.6*  HCT 31.6* 37.0*  MCV 78.4  --   PLT 214  --    Cardiac Enzymes: No results found for this basename: CKTOTAL, CKMB, CKMBINDEX, TROPONINI,  in the last 168 hours  BNP (last 3 results) No results found for this basename: PROBNP,  in the last 8760 hours CBG: No results found for this basename: GLUCAP,  in the last 168 hours  Radiological Exams on Admission: Dg Chest 2 View  01/12/2013   *RADIOLOGY REPORT*  Clinical Data: Syncope.  History of emphysema.  CHEST - 2 VIEW  Comparison: PA and lateral chest 05/18/2012 and 02/12/2011.   Findings: The chest is hyperexpanded with attenuation of the pulmonary vasculature but the lungs are clear.  Heart size is normal.  No pneumothorax or pleural fluid.  Remote left clavicle fractures noted.  IMPRESSION: Emphysema without acute disease.   Original Report Authenticated By: Holley Dexter, M.D.   Ct Head Wo Contrast  01/12/2013   *RADIOLOGY REPORT*  Clinical Data: Syncope and weakness.  CT HEAD WITHOUT CONTRAST  Technique:  Contiguous axial images were obtained from the base of the skull through the vertex without contrast.  Comparison: CT head without contrast 01/03/2012.  Findings: Mild generalized atrophy is similar to the prior study. No acute cortical infarct, hemorrhage or mass lesion is present. The ventricles are proportionate to the degree of atrophy.  No significant white matter disease is present.  No significant extra- axial fluid collection is evident.  A fluid level is present in the right maxillary sinus.  The remaining paranasal sinuses and mastoid air cells are clear.  The osseous skull is intact.  IMPRESSION:  1.  Moderate generalized atrophy is advanced for age.  This could be related to alcohol abuse. 2.  No acute intracranial abnormality. 3.  Right maxillary sinusitis.   Original Report Authenticated By: Marin Roberts, M.D.    EKG: NSR with nonspecific changes  Assessment/Plan    Syncope: Likely alcohol related. Will check orthostatics. Observation on telemetry. Will give IV fluid. Physical therapy evaluation.    Alcohol intoxication: High risk for withdrawal. Will start CIWA protocol. Consult social work. Thiamine, folate, multivitamin   COPD (chronic obstructive pulmonary disease), stable    Lactic acidosis: No evidence of infection. Will give IV fluid and repeat in the morning   Cirrhosis   History of stroke Tobacco abuse  Code Status: full Family Communication: caregiver (who is also patient's cousin  Disposition Plan: home with caregiver  Time spent:  60 minutes  Anahis Furgeson L [facility] Pager [phone]  If 7PM-7AM, please contact night-coverage [email or web address] Password [email or web address] 01/12/2013, 7:11 PM

## 2013-01-12 NOTE — ED Provider Notes (Signed)
History    CSN: [id number] Arrival date & time 01/12/13  1404  First MD Initiated Contact with Patient 01/12/13 1544     Chief Complaint  Patient presents with  . Weakness   (Consider location/radiation/quality/duration/timing/severity/associated sxs/prior Treatment) HPI Comments: 62 y.o. Male with PMHx of ETOH abuse, CVA (3 in 2012) that resulted in right sided lower extremity hemiparesis), cirrohosis, presents today with home health aid complaining of at least one episode of syncope that occurred yesterday, unwitnessed, and increasing unsteadiness on his feet and generalized weakness. Caregiver also states that dementia seems to be increasing. Caregiver lives with pt 24/7 except for 3 hours a day. She is concerned to leave him those three hours as she feels he cannot be by himself with increased falls, syncope, and dementia. Pt does admit to being an almost daily drinker and states he did drink part of a 40 oz today. Pt is alert and oriented, answers appropriately, though confused at times. Pt and caregiver deny recent fevers, headache, new visual disturbances (pt does have cataracts and was recently seen by ophthalmologist), chest pain, neck pain, back pain, hip pain, shortness of breath, abdominal pain, nausea, vomiting, diarrhea, dysuria, hematuria  Patient is a 62 y.o. male presenting with weakness.  Weakness Associated symptoms include weakness. Pertinent negatives include no abdominal pain, chest pain, diaphoresis, fever, headaches, nausea, neck pain, numbness, rash or vomiting.   Past Medical History  Diagnosis Date  . Hypertension   . Hyperlipemia   . Emphysema   . Stroke   . Chronic pain   . Cirrhosis of liver   . Fall   . Dental injury   . Bone spur of other site     Left Foot  . Impaired mobility   . Total self care deficit   . Pneumonia   . Emphysema   . ETOH abuse   . Elevated LFTs   . Arthritis   . Anxiety   . Sinusitis   . Anemia     iron def.  . Hard of  hearing    Past Surgical History  Procedure Laterality Date  . No past surgeries    . Colonoscopy  01/06/2012    Procedure: COLONOSCOPY;  Surgeon: Theda Belfast, MD;  Location: [facility] ENDOSCOPY;  Service: Endoscopy;  Laterality: N/A;  . Esophagogastroduodenoscopy  01/06/2012    Procedure: ESOPHAGOGASTRODUODENOSCOPY (EGD);  Surgeon: Theda Belfast, MD;  Location: [facility] ENDOSCOPY;  Service: Endoscopy;  Laterality: N/A;  . Esophagogastroduodenoscopy  01/09/2012    Procedure: ESOPHAGOGASTRODUODENOSCOPY (EGD);  Surgeon: Theda Belfast, MD;  Location: [facility] ENDOSCOPY;  Service: Endoscopy;  Laterality: N/A;  . Multiple extractions with alveoloplasty  03/29/2012    Procedure: MULTIPLE EXTRACION WITH ALVEOLOPLASTY;  Surgeon: Georgia Lopes, DDS;  Location: [facility] OR;  Service: Oral Surgery;  Laterality: Bilateral;  . Bone spur  2013    left spur   History reviewed. No pertinent family history. History  Substance Use Topics  . Smoking status: Current Every Day Smoker -- 2.00 packs/day for 30 years    Types: Cigarettes  . Smokeless tobacco: Never Used  . Alcohol Use: 2.4 oz/week    4 Cans of beer per week     Comment: per pt four  40oz daily beer    Review of Systems  Constitutional: Negative for fever and diaphoresis.  HENT: Negative for neck pain and neck stiffness.   Eyes: Negative for visual disturbance.  Respiratory: Negative for apnea, chest tightness and shortness of breath.  Cardiovascular: Negative for chest pain and palpitations.  Gastrointestinal: Negative for nausea, vomiting, abdominal pain, diarrhea and constipation.  Genitourinary: Negative for dysuria, hematuria and flank pain.  Musculoskeletal: Negative for gait problem.  Skin: Negative for rash.  Neurological: Positive for syncope and weakness. Negative for dizziness, light-headedness, numbness and headaches.    Allergies  Poison ivy extract  Home Medications   Current Outpatient Rx  Name  Route  Sig  Dispense  Refill  .  acamprosate (CAMPRAL) 333 MG tablet   Oral   Take 2 tablets (666 mg total) by mouth 3 (three) times daily.   6 tablet   0   . albuterol (PROVENTIL HFA;VENTOLIN HFA) 108 (90 BASE) MCG/ACT inhaler   Inhalation   Inhale 2 puffs into the lungs every 6 (six) hours as needed. Shortness of breath         . budesonide-formoterol (SYMBICORT) 80-4.5 MCG/ACT inhaler   Inhalation   Inhale 2 puffs into the lungs 2 (two) times daily. For COPD.         . ferrous sulfate 325 (65 FE) MG tablet   Oral   Take 325 mg by mouth daily with breakfast. For low iron.         . folic acid (FOLVITE) 1 MG tablet   Oral   Take 1 mg by mouth daily. For low folate.         Marland Kitchen LORazepam (ATIVAN) 1 MG tablet   Oral   Take 1 tablet (1 mg total) by mouth every 8 (eight) hours as needed for anxiety.   10 tablet   0   . Multiple Vitamin (MULTIVITAMIN) tablet   Oral   Take 1 tablet by mouth daily.   30 tablet   0   . nicotine (NICODERM CQ - DOSED IN MG/24 HR) 7 mg/24hr patch   Transdermal   Place 1 patch onto the skin daily.   28 patch   0   . pravastatin (PRAVACHOL) 40 MG tablet   Oral   Take 80 mg by mouth daily. To lower your cholesterol.         . thiamine (VITAMIN B-1) 100 MG tablet   Oral   Take 100 mg by mouth daily. For vitamin B deficiency.         . traMADol (ULTRAM) 50 MG tablet   Oral   Take 50 mg by mouth 2 (two) times daily as needed. For pain         . traZODone (DESYREL) 50 MG tablet   Oral   Take 1 tablet (50 mg total) by mouth at bedtime.   30 tablet   0   . vitamin B-12 (CYANOCOBALAMIN) 100 MCG tablet   Oral   Take 100 mcg by mouth daily. For low B12.         . vitamin E 200 UNIT capsule   Oral   Take 1 capsule (200 Units total) by mouth daily.   7 capsule   0    BP 114/73  Pulse 105  Temp(Src) 98.8 F (37.1 C) (Oral)  Resp 20  SpO2 95% Physical Exam  Nursing note and vitals reviewed. Constitutional: He is oriented to person, place, and time. He  appears well-developed and well-nourished. No distress.  Frail looking  HENT:  Head: Normocephalic and atraumatic.  Eyes: Conjunctivae and EOM are normal.  Neck: Normal range of motion. Neck supple.  No meningeal signs  Cardiovascular: Normal rate, regular rhythm and normal heart sounds.  Exam  reveals no gallop and no friction rub.   No murmur heard. Pulmonary/Chest: Effort normal and breath sounds normal. No respiratory distress. He has no wheezes. He has no rales. He exhibits no tenderness.  Abdominal: Soft. Bowel sounds are normal. He exhibits no distension. There is no tenderness. There is no rebound and no guarding.  Musculoskeletal: He exhibits no edema and no tenderness.  Pt has baseline weakness to right lower extremity s/p CVA 2012.  No step-offs noted on C-spine No tenderness to palpation of the spinous processes of the C-spine, T-spine or L-spine Full range of motion of C-spine, T-spine, L-spine No tenderness to palpation of the paraspinous muscles   Neurological: He is alert and oriented to person, place, and time. No cranial nerve deficit.  Speech is clear and goal oriented, follows commands Sensation normal to light touch and two point discrimination Moves extremities without ataxia, coordination slow but intact Balance recently impaired Pt states decreased strength in bilateral upper extremities, is not his baseline, Good bilateral dorsiflexion and plantar flexion Good equal grip strength   Skin: Skin is warm and dry. He is not diaphoretic. No erythema.    ED Course  Procedures (including critical care time)   Date: 01/12/2013  Rate: 94  Rhythm: sinus rhythm  QRS Axis: normal  Intervals: QT prolonged borderline  ST/T Wave abnormalities: nonspecific ST changes  Conduction Disutrbances:none  Narrative Interpretation: borderline EKG  Old EKG Reviewed: changes noted 08/31/12, T wave abnormalities, but no prolonged QT   Labs Reviewed  COMPREHENSIVE METABOLIC  PANEL - Abnormal; Notable for the following:    Chloride 94 (*)    AST 106 (*)    All other components within normal limits  CBC - Abnormal; Notable for the following:    WBC 2.6 (*)    RBC 4.03 (*)    Hemoglobin 10.8 (*)    HCT 31.6 (*)    RDW 17.9 (*)    All other components within normal limits  ETHANOL - Abnormal; Notable for the following:    Alcohol, Ethyl (B) 256 (*)    All other components within normal limits  POCT I-STAT, CHEM 8 - Abnormal; Notable for the following:    Sodium 134 (*)    BUN 4 (*)    Calcium, Ion 1.05 (*)    Hemoglobin 12.6 (*)    HCT 37.0 (*)    All other components within normal limits  CG4 I-STAT (LACTIC ACID) - Abnormal; Notable for the following:    Lactic Acid, Venous 3.90 (*)    All other components within normal limits  MAGNESIUM  URINALYSIS, ROUTINE W REFLEX MICROSCOPIC  POCT I-STAT TROPONIN I   Dg Chest 2 View  01/12/2013   *RADIOLOGY REPORT*  Clinical Data: Syncope.  History of emphysema.  CHEST - 2 VIEW  Comparison: PA and lateral chest 05/18/2012 and 02/12/2011.  Findings: The chest is hyperexpanded with attenuation of the pulmonary vasculature but the lungs are clear.  Heart size is normal.  No pneumothorax or pleural fluid.  Remote left clavicle fractures noted.  IMPRESSION: Emphysema without acute disease.   Original Report Authenticated By: Holley Dexter, M.D.   Ct Head Wo Contrast  01/12/2013   *RADIOLOGY REPORT*  Clinical Data: Syncope and weakness.  CT HEAD WITHOUT CONTRAST  Technique:  Contiguous axial images were obtained from the base of the skull through the vertex without contrast.  Comparison: CT head without contrast 01/03/2012.  Findings: Mild generalized atrophy is similar to the prior study. No acute  cortical infarct, hemorrhage or mass lesion is present. The ventricles are proportionate to the degree of atrophy.  No significant white matter disease is present.  No significant extra- axial fluid collection is evident.  A fluid  level is present in the right maxillary sinus.  The remaining paranasal sinuses and mastoid air cells are clear.  The osseous skull is intact.  IMPRESSION:  1.  Moderate generalized atrophy is advanced for age.  This could be related to alcohol abuse. 2.  No acute intracranial abnormality. 3.  Right maxillary sinusitis.   Original Report Authenticated By: Marin Roberts, M.D.   No diagnosis found.  MDM  Pt with hx of CVA and long term ETOH abuse who admits to drinking today. Multiple falls, unwitnessed syncope. Neuro exam shows no new focal deficits, slow but intact coordination, 4/5 strength in upper extremities which pt and home health care says is a decline. Will get lactic acid, CT head, troponin, EKG, labs. Re-evaluate.    CT shows no acute abnormalities, but moderate generalized atrophy that is advanced for age, possibly related to alcohol abuse.  Labs show lactic acidosis at 3.9, possibly related to decreased hepatic function. Pt seen by Dr. Theodoro Kalata as well who agrees pt should be admitted for lactic acidosis, syncope, increasing generalized weakness. ETOH returned at 256. CIWA protocols placed. Pt does have hx of cirrhosis and elevated LFTs (AST 106 today).   Hospital consult requested. Dr. Lendell Caprice returned the call and will see the pt.   Glade Nurse, PA-C 01/12/13 1907

## 2013-01-12 NOTE — Progress Notes (Signed)
Proxy forms for My Chart and information sheet given to caregiver Bonita Quin per request of patient. Patient states he is interested in sister becoming proxy for him. Briscoe Burns BSN, RN-BC Admissions RN 01/12/2013 6:45 PM

## 2013-01-12 NOTE — Progress Notes (Signed)
EDCM asked to see patient regarding possible home health services.  Patient curently has 24 hour nursin assistant care by Blenda Bridegroom from an agency called [facility].  Bonita Quin is currently at patient's bedside.  Patient states, "My legs are weak."  EDCM provided patient a list of home health agencies in [facility].  Informed patient that the home health agency will be able to provide an RN, PT, OTand Child psychotherapist.  Informed patient that our physical therapists here inhe hospital may recommend that he goes to a rehab after discharge to get stronger before he goes home.  Patient stated, "I'm not going to a Rehab."  [facility] informed patient that it is his choice whether he goes to a Rehab or not.  We can only reccomend  what we think is best for him.  Patient in agreement with that statement.  Bonita Quin, the patient's caregiver also interested in CAPS program for patient when he returns home.  Patient and caregiver thankful for information.  No further needs at this time.

## 2013-01-12 NOTE — ED Provider Notes (Signed)
Medical screening examination/treatment/procedure(s) were performed by non-physician practitioner and as supervising physician I was immediately available for consultation/collaboration.  Sunnie Nielsen, MD 01/12/13 [phone]

## 2013-01-12 NOTE — ED Notes (Signed)
Pt arrives from home per pts caregiver at bedside, reports pt has had increased falls and weakness. Reports hx of cirrhosis and liver "problems"

## 2013-01-12 NOTE — ED Notes (Signed)
Patient transported to CT 

## 2013-01-13 DIAGNOSIS — D72819 Decreased white blood cell count, unspecified: Secondary | ICD-10-CM | POA: Diagnosis present

## 2013-01-13 DIAGNOSIS — F10229 Alcohol dependence with intoxication, unspecified: Secondary | ICD-10-CM

## 2013-01-13 DIAGNOSIS — I951 Orthostatic hypotension: Secondary | ICD-10-CM | POA: Diagnosis not present

## 2013-01-13 DIAGNOSIS — D649 Anemia, unspecified: Secondary | ICD-10-CM

## 2013-01-13 LAB — CBC WITH DIFFERENTIAL/PLATELET
Eosinophils Relative: 1 % (ref 0–5)
HCT: 28 % — ABNORMAL LOW (ref 39.0–52.0)
Lymphocytes Relative: 32 % (ref 12–46)
Lymphs Abs: 1 10*3/uL (ref 0.7–4.0)
MCV: 77.8 fL — ABNORMAL LOW (ref 78.0–100.0)
Monocytes Absolute: 0.5 10*3/uL (ref 0.1–1.0)
Neutro Abs: 1.5 10*3/uL — ABNORMAL LOW (ref 1.7–7.7)
Platelets: 180 10*3/uL (ref 150–400)
RBC: 3.6 MIL/uL — ABNORMAL LOW (ref 4.22–5.81)
WBC: 3 10*3/uL — ABNORMAL LOW (ref 4.0–10.5)

## 2013-01-13 LAB — COMPREHENSIVE METABOLIC PANEL
ALT: 41 U/L (ref 0–53)
CO2: 26 mEq/L (ref 19–32)
Calcium: 9 mg/dL (ref 8.4–10.5)
Chloride: 99 mEq/L (ref 96–112)
GFR calc Af Amer: >90 mL/min (ref >90)
GFR calc non Af Amer: >90 mL/min (ref >90)
Glucose, Bld: 85 mg/dL (ref 70–99)
Sodium: 137 mEq/L (ref 135–145)
Total Bilirubin: 0.7 mg/dL (ref 0.3–1.2)

## 2013-01-13 LAB — PROTIME-INR: INR: 1 (ref 0.00–1.49)

## 2013-01-13 MED ORDER — NICOTINE 21 MG/24HR TD PT24
21.0000 mg | MEDICATED_PATCH | Freq: Every day | TRANSDERMAL | Status: DC
  Administered 2013-01-13 – 2013-01-19 (×7): 21 mg via TRANSDERMAL
  Filled 2013-01-13 (×7): qty 1

## 2013-01-13 MED ORDER — SODIUM CHLORIDE 0.9 % IV SOLN
INTRAVENOUS | Status: AC
  Administered 2013-01-14: 04:00:00 via INTRAVENOUS

## 2013-01-13 NOTE — Progress Notes (Addendum)
[facility] PROGRESS NOTE  Westen Dinino AVW:[id number] DOB: Feb 07, 1951 DOA: 01/12/2013 PCP: Cameron Sprang, FNP  Brief narrative 62 year old male patient with history of alcohol dependence, cirrhosis, HTN, HL, emphysema, possible dementia who has 24/7 home care by cousin, who was brought to the [facility] ED on 01/12/13 due to multiple episodes of falls associated with syncope. He also complained of dizziness on standing. He apparently eats poorly and had periodic vomiting without hematemesis or melena. In the ED, BAL 256, venous lactate 3.9, CT head without acute findings. Hospitalist admission was requested.  Assessment/Plan: 1. Syncope/recurrent falls: Possibly from alcohol intoxication/orthostatic hypotension. Telemetry unremarkable except for initial sinus tachycardia which has improved. Check orthostatic blood pressures. We'll repeat 2-D echo (EF normal in June 2013) and check carotid Dopplers for completion. PT and OT evaluation appreciated. CT head negative. 2. Orthostatic hypotension: Possibly from intravascular volume depletion. Continue gentle IV fluids for additional 24 hours and monitor. 3. Alcohol dependence/intoxication: Per patient's mother, patient has had prior episodes of DTs. He apparently drinks 2x40 ounce malt liquor daily or even more. No overt withdrawal at this time. Continue Ativan withdrawal protocol. Moderation/cessation counseled. 4. Leukopenia and microcytic anemia: Likely secondary to alcohol abuse & cirrhosis. Follow CBC in a.m. 5. Emphysema: Stable. 6. HTN: Controlled. 7. Lactic acidosis: Present on admission. Improved/resolved. Unclear etiology. 8. Possible dementia: Patient states that he has Alzheimer's. This could be related to alcohol abuse.  Code Status: Full Family Communication: Discussed with mother at bedside. Disposition Plan: Home when medically stable.   Consultants:  None  Procedures:  None  Antibiotics:  None    HPI/Subjective: Patients states that he feels much better. Feels less dizzy. Denies chest pain, dyspnea, nausea or vomiting. Denies pain.  Objective: Filed Vitals:   01/13/13 0526 01/13/13 0621 01/13/13 0825 01/13/13 1435  BP: 110/70 110/70  127/73  Pulse: 141 98  102  Temp: 99.2 F (37.3 C)   99.5 F (37.5 C)  TempSrc:    Oral  Resp:  20  22  Height:      Weight:      SpO2:   98% 100%    Intake/Output Summary (Last 24 hours) at 01/13/13 1640 Last data filed at 01/13/13 1444  Gross per 24 hour  Intake 1687.92 ml  Output   1400 ml  Net 287.92 ml   Filed Weights   01/12/13 2046  Weight: 62.1 kg (136 lb 14.5 oz)    Exam:   General exam: Comfortable.Chronically ill-looking.   Respiratory system: Clear. No increased work of breathing.  Cardiovascular system: S1 & S2 heard, RRR. No JVD, murmurs, gallops, clicks or pedal edema.Telemetry: Was sinus tachycardic in the 140s on admission-improved to mostly sinus rhythm and occasional ST in the 100's   Gastrointestinal system: Abdomen is nondistended, soft and nontender. Normal bowel sounds heard.  Central nervous system: Alert and oriented. No focal neurological deficits.  Extremities: Symmetric 5 x 5 power.   Data Reviewed: Basic Metabolic Panel:  Recent Labs Lab 01/12/13 1610 01/12/13 1628 01/13/13 0445  NA 135 134* 137  K 4.2 4.3 3.7  CL 94* 100 99  CO2 19  --  26  GLUCOSE 77 79 85  BUN 6 4* 6  CREATININE 0.66 1.10 0.66  CALCIUM 8.8  --  9.0  MG 1.9  --   --    Liver Function Tests:  Recent Labs Lab 01/12/13 1610 01/13/13 0445  AST 106* 81*  ALT 49 41  ALKPHOS 100 92  BILITOT  0.3 0.7  PROT 8.0 7.4  ALBUMIN 3.8 3.6   No results found for this basename: LIPASE, AMYLASE,  in the last 168 hours No results found for this basename: AMMONIA,  in the last 168 hours CBC:  Recent Labs Lab 01/12/13 1612 01/12/13 1628 01/13/13 0445  WBC 2.6*  --  3.0*  NEUTROABS  --   --  1.5*  HGB 10.8* 12.6*  9.6*  HCT 31.6* 37.0* 28.0*  MCV 78.4  --  77.8*  PLT 214  --  180   Cardiac Enzymes: No results found for this basename: CKTOTAL, CKMB, CKMBINDEX, TROPONINI,  in the last 168 hours BNP (last 3 results) No results found for this basename: PROBNP,  in the last 8760 hours CBG: No results found for this basename: GLUCAP,  in the last 168 hours  No results found for this or any previous visit (from the past 240 hour(s)).   Studies: Dg Chest 2 View  01/12/2013   *RADIOLOGY REPORT*  Clinical Data: Syncope.  History of emphysema.  CHEST - 2 VIEW  Comparison: PA and lateral chest 05/18/2012 and 02/12/2011.  Findings: The chest is hyperexpanded with attenuation of the pulmonary vasculature but the lungs are clear.  Heart size is normal.  No pneumothorax or pleural fluid.  Remote left clavicle fractures noted.  IMPRESSION: Emphysema without acute disease.   Original Report Authenticated By: Holley Dexter, M.D.   Ct Head Wo Contrast  01/12/2013   *RADIOLOGY REPORT*  Clinical Data: Syncope and weakness.  CT HEAD WITHOUT CONTRAST  Technique:  Contiguous axial images were obtained from the base of the skull through the vertex without contrast.  Comparison: CT head without contrast 01/03/2012.  Findings: Mild generalized atrophy is similar to the prior study. No acute cortical infarct, hemorrhage or mass lesion is present. The ventricles are proportionate to the degree of atrophy.  No significant white matter disease is present.  No significant extra- axial fluid collection is evident.  A fluid level is present in the right maxillary sinus.  The remaining paranasal sinuses and mastoid air cells are clear.  The osseous skull is intact.  IMPRESSION:  1.  Moderate generalized atrophy is advanced for age.  This could be related to alcohol abuse. 2.  No acute intracranial abnormality. 3.  Right maxillary sinusitis.   Original Report Authenticated By: Marin Roberts, M.D.     Additional labs:   Scheduled  Meds: . brimonidine  1 drop Both Eyes BID  . budesonide-formoterol  2 puff Inhalation BID  . ferrous sulfate  325 mg Oral Q breakfast  . folic acid  1 mg Oral Daily  . LORazepam  0-4 mg Oral Q6H   Followed by  . [START ON 01/14/2013] LORazepam  0-4 mg Oral Q12H  . multivitamin with minerals  1 tablet Oral Daily  . sodium chloride  3 mL Intravenous Q12H  . thiamine  100 mg Oral Daily  . traZODone  50 mg Oral QHS  . vitamin B-12  100 mcg Oral Daily   Continuous Infusions: . sodium chloride 125 mL/hr at 01/13/13 1423    Principal Problem:   Syncope Active Problems:   COPD (chronic obstructive pulmonary disease)   Alcohol intoxication   Lactic acidosis   Cirrhosis   History of stroke   Leukopenia   Anemia    Time spent: 40 minutes    [facility]  [facility] Pager [phone].   If 8PM-8AM, please contact night-coverage at [email or web address], password [facility] 01/13/2013, 4:40 PM  LOS: 1 day

## 2013-01-13 NOTE — Evaluation (Signed)
Physical Therapy Evaluation Patient Details Name: Peter [name] MRN: [id number] DOB: 02/18/1951 Today's Date: 01/13/2013 Time: 6578-4696 PT Time Calculation (min): 27 min  PT Assessment / Plan / Recommendation History of Present Illness  Peter [name] is a 62 y.o. male alcoholic who was brought to the emergency room by his caregiver with multiple episodes of near syncope. He reports that when he stands up, he feels weak and nearly loses consciousness.  Pt blood alcohol level on adm was 256  Clinical Impression  Pt will benefit from Pt due to deficits below, has near 24* assist at home  PT Assessment  Patient needs continued PT services    Follow Up Recommendations  Home health PT;Supervision/Assistance - 24 hour (has ~24hr care)    Does the patient have the potential to tolerate intense rehabilitation      Barriers to Discharge        Equipment Recommendations       Recommendations for Other Services     Frequency Min 3X/week    Precautions / Restrictions Precautions Precautions: Fall   Pertinent Vitals/Pain Denies pain      Mobility  Bed Mobility Bed Mobility: Supine to Sit Supine to Sit: 4: Min assist;HOB elevated Details for Bed Mobility Assistance: cues for technique and  requires incr time Transfers Transfers: Sit to Stand;Stand to Sit Sit to Stand: 4: Min assist;From bed;From toilet Stand to Sit: 4: Min guard;To toilet;To chair/3-in-1 Details for Transfer Assistance: cues for hand placement and wt shift; initial posterior LOB Ambulation/Gait Ambulation/Gait Assistance: 4: Min assist;3: Mod assist Ambulation Distance (Feet): 150 Feet (and 10') Assistive device: Rolling walker Ambulation/Gait Assistance Details: cues for safety, to stay inside RW Gait Pattern: Step-through pattern;Decreased stride length;Wide base of support    Exercises     PT Diagnosis: Difficulty walking  PT Problem List: Decreased activity tolerance;Decreased balance;Decreased  mobility;Decreased knowledge of use of DME;Decreased safety awareness PT Treatment Interventions: DME instruction;Gait training;Stair training;Functional mobility training;Therapeutic activities;Balance training;Patient/family education     PT Goals(Current goals can be found in the care plan section) Acute Rehab PT Goals Patient Stated Goal: home PT Goal Formulation: With patient Time For Goal Achievement: 01/27/13 Potential to Achieve Goals: Good  Visit Information  Last PT Received On: 01/13/13 Assistance Needed: +1 History of Present Illness: Peter [name] is a 62 y.o. male alcoholic who was brought to the emergency room by his caregiver with multiple episodes of near syncope. He reports that when he stands up, he feels weak and nearly loses consciousness.  Pt blood alcohol level on adm was 256       Prior Functioning  Home Living Family/patient expects to be discharged to:: Private residence Living Arrangements: Other (Comment) Available Help at Discharge: Other (Comment) Type of Home: House Home Access: Stairs to enter Entrance Stairs-Number of Steps: 4 Entrance Stairs-Rails: None Home Equipment: Walker - 2 wheels;Cane - single point Additional Comments: uses both; pt cousin is his caregiver and is with him ~22 hrs per day Prior Function Level of Independence: Independent with assistive device(s) Communication Communication: No difficulties    Cognition  Cognition Arousal/Alertness: Awake/alert Behavior During Therapy: WFL for tasks assessed/performed Memory: Decreased short-term memory    Extremity/Trunk Assessment Upper Extremity Assessment Upper Extremity Assessment: Overall WFL for tasks assessed Lower Extremity Assessment Lower Extremity Assessment: Overall WFL for tasks assessed   Balance    End of Session PT - End of Session Equipment Utilized During Treatment: Gait belt Activity Tolerance: Patient tolerated treatment well Patient left: in chair;with  call  bell/phone within reach;with family/visitor present (his caregiver, Bonita Quin)  GP     [facility] 01/13/2013, 2:01 PM

## 2013-01-13 NOTE — Progress Notes (Signed)
Clinical Social Work Department BRIEF PSYCHOSOCIAL ASSESSMENT 01/13/2013  Patient:  Peter [name], Peter [name]     Account Number:  [id number]     Admit date:  01/12/2013  Clinical Social Worker:  Hattie Perch  Date/Time:  01/13/2013 12:00 M  Referred by:  Physician  Date Referred:  01/13/2013 Referred for  Substance Abuse   Other Referral:   Interview type:  Patient Other interview type:    PSYCHOSOCIAL DATA Living Status:  OTHER Admitted from facility:   Level of care:   Primary support name:  linda smith Primary support relationship to patient:  NONE Degree of support available:   good. patient has 24 hour caregiver. Blenda Bridegroom is patient's caregiver.    CURRENT CONCERNS  Other Concerns:    SOCIAL WORK ASSESSMENT / PLAN CSW met with patient. patient is alert and oriented X3. patient came in with syncope but MD in the emergency room assessed patient for alochol abuse. patient is on alcohol protocol here at the hospital. Patient confirms that he lives at home and has 24 hour care. When CSW asked about his drinking he states " I drink two 40's a day." patient states that he has been drinking that daily for about 9 years.CSW asked patient if he would like any referrals to outpatient or any type of alcohol cessation programs. Patient is not interested in any of those at this time. CSW encouraged patient to ask his nurse to contact social worker if he had any questions or concerns.   Assessment/plan status:   Other assessment/ plan:   Information/referral to community resources:    PATIENT'S/FAMILY'S RESPONSE TO PLAN OF CARE: patient is not interested in alcohol cessation or resources at this time. Patient's face visually changed when CSW brought up his alcohol use and he disengaged slightly from interview when CSW mentioned it. He is adamant that his drinking is not a problem. no further CSW needs noted. This CSW is signing off.

## 2013-01-14 ENCOUNTER — Other Ambulatory Visit: Payer: Self-pay

## 2013-01-14 DIAGNOSIS — R55 Syncope and collapse: Secondary | ICD-10-CM

## 2013-01-14 DIAGNOSIS — F10239 Alcohol dependence with withdrawal, unspecified: Secondary | ICD-10-CM

## 2013-01-14 DIAGNOSIS — I951 Orthostatic hypotension: Secondary | ICD-10-CM

## 2013-01-14 DIAGNOSIS — F10939 Alcohol use, unspecified with withdrawal, unspecified: Secondary | ICD-10-CM

## 2013-01-14 DIAGNOSIS — D72819 Decreased white blood cell count, unspecified: Secondary | ICD-10-CM

## 2013-01-14 LAB — CBC
MCH: 26.2 pg (ref 26.0–34.0)
MCHC: 33.1 g/dL (ref 30.0–36.0)
MCV: 79.2 fL (ref 78.0–100.0)
Platelets: 154 10*3/uL (ref 150–400)
RBC: 3.51 MIL/uL — ABNORMAL LOW (ref 4.22–5.81)
RDW: 17.8 % — ABNORMAL HIGH (ref 11.5–15.5)

## 2013-01-14 MED ORDER — LORAZEPAM 2 MG/ML IJ SOLN
2.0000 mg | Freq: Four times a day (QID) | INTRAMUSCULAR | Status: DC
  Administered 2013-01-15 (×2): 2 mg via INTRAVENOUS
  Filled 2013-01-14 (×2): qty 1

## 2013-01-14 MED ORDER — LORAZEPAM 2 MG/ML IJ SOLN
4.0000 mg | Freq: Once | INTRAMUSCULAR | Status: AC
  Administered 2013-01-14: 4 mg via INTRAVENOUS

## 2013-01-14 MED ORDER — LORAZEPAM 2 MG/ML IJ SOLN
INTRAMUSCULAR | Status: AC
  Filled 2013-01-14: qty 2

## 2013-01-14 MED ORDER — LORAZEPAM 2 MG/ML IJ SOLN
INTRAMUSCULAR | Status: AC
  Administered 2013-01-14: 4 mg
  Filled 2013-01-14: qty 2

## 2013-01-14 MED ORDER — LORAZEPAM 2 MG/ML IJ SOLN
INTRAMUSCULAR | Status: AC
  Administered 2013-01-14: 2 mg via INTRAVENOUS
  Filled 2013-01-14: qty 2

## 2013-01-14 MED ORDER — LORAZEPAM 2 MG/ML IJ SOLN
1.0000 mg | Freq: Once | INTRAMUSCULAR | Status: AC
  Administered 2013-01-14: 1 mg via INTRAVENOUS

## 2013-01-14 MED ORDER — LORAZEPAM 2 MG/ML IJ SOLN
1.0000 mg | Freq: Four times a day (QID) | INTRAMUSCULAR | Status: DC
  Administered 2013-01-14: 1 mg via INTRAVENOUS

## 2013-01-14 MED ORDER — POTASSIUM CHLORIDE IN NACL 20-0.9 MEQ/L-% IV SOLN
INTRAVENOUS | Status: DC
  Administered 2013-01-14: 100 mL via INTRAVENOUS
  Administered 2013-01-16 – 2013-01-17 (×3): via INTRAVENOUS
  Filled 2013-01-14 (×8): qty 1000

## 2013-01-14 NOTE — Care Management Note (Signed)
    Page 1 of 1   01/14/2013     4:16:47 PM   CARE MANAGEMENT NOTE 01/14/2013  Patient:  Peter [name], Peter [name]   Account Number:  [id number]  Date Initiated:  01/14/2013  Documentation initiated by:  Lanier Clam  Subjective/Objective Assessment:   ADMITTED W/SYNCOPE,DT.JY:NWGN.     Action/Plan:   FROM HOME.HAS PCP,PHARMACY HAS 24HR CAREGIVER FROM [facility].COUSIN-LINDA SMITH-SUPPORT.   Anticipated DC Date:  01/17/2013   Anticipated DC Plan:  HOME/SELF CARE  In-house referral  Financial Counselor      DC Planning Services  CM consult      Choice offered to / List presented to:             Status of service:  In process, will continue to follow Medicare Important Message given?   (If response is "NO", the following Medicare IM given date fields will be blank) Date Medicare IM given:   Date Additional Medicare IM given:    Discharge Disposition:    Per UR Regulation:  Reviewed for med. necessity/level of care/duration of stay  If discussed at Long Length of Stay Meetings, dates discussed:    Comments:  01/14/13 Lynnel Zanetti RN,BSN NCM [phone] SEEN BY FINANCIAL COUNSELOR EXPLORING MEDICAID.PATIENT STATES MEDICARE WILL START 03/07/13.PROVIDED W/LIFE ALERT BROCHURE.D/C PLANS HOME.

## 2013-01-14 NOTE — Progress Notes (Addendum)
[facility] PROGRESS NOTE  Peter [name] ION:[id number] DOB: [birth year] DOA: 01/12/2013 PCP: Cameron Sprang, FNP  Brief narrative 62 year old male patient with history of alcohol dependence, cirrhosis, HTN, HL, emphysema, possible dementia who has 24/7 home care by cousin, who was brought to the [facility] ED on 01/12/13 due to multiple episodes of falls associated with syncope. He also complained of dizziness on standing. He apparently eats poorly and had periodic vomiting without hematemesis or melena. In the ED, BAL 256, venous lactate 3.9, CT head without acute findings. Hospitalist admission was requested.  Assessment/Plan: 1. Syncope/recurrent falls: Possibly from alcohol intoxication/orthostatic hypotension. Telemetry shows periodic sinus tachycardia. Orthostatics positive. 2-D echo shows mildly reduced LVEF 45-50%. Carotid Dopplers: Bilateral <39% ICA stenosis. PT and OT evaluation appreciated. CT head negative. May need outpatient cardiology evaluation for mildly reduced EF. 2. Orthostatic hypotension: Possibly from intravascular volume depletion. Continue gentle IV fluids for additional 24 hours and monitor. 3. Alcohol dependence/intoxication/early withdrawal: Per patient's mother, patient has had prior episodes of DTs. He apparently drinks 2x40 ounce malt liquor daily or even more. Continue Ativan withdrawal protocol. Moderation/cessation counseled. Patient showing some early signs of alcohol withdrawal-slightly anxious and tachycardic. Over the course of the day, patient progressively became more agitated-started on scheduled IV Ativan and monitor closely. If does not improve, may consider transfer to ICU for Precedex drip. 4. Leukopenia and microcytic anemia: Likely secondary to alcohol abuse & cirrhosis. Leukopenia resolved. Anemia stable. 5. Emphysema: Stable. 6. HTN: Controlled. 7. Lactic acidosis: Present on admission. Improved/resolved. Unclear etiology. 8. Possible  dementia: Patient states that he has Alzheimer's. This could be related to alcohol abuse.  Code Status: Full Family Communication: Discussed with mother at bedside on 7/10. Disposition Plan: Home when medically stable.   Consultants:  None  Procedures:  None  Antibiotics:  None   HPI/Subjective: Patient denies complaints. Denies dizziness.  Objective: Filed Vitals:   01/14/13 0845 01/14/13 0858 01/14/13 1221 01/14/13 1228  BP:    157/107  Pulse:  145 122 130  Temp:    99.5 F (37.5 C)  TempSrc:      Resp:    20  Height:      Weight:      SpO2: 99%   100%    Intake/Output Summary (Last 24 hours) at 01/14/13 1639 Last data filed at 01/14/13 1012  Gross per 24 hour  Intake   1240 ml  Output   3126 ml  Net  -1886 ml   Filed Weights   01/12/13 2046  Weight: 62.1 kg (136 lb 14.5 oz)    Exam:   General exam: Comfortable.Chronically ill-looking. Slightly anxious. Not tremulous.  Respiratory system: Clear. No increased work of breathing.  Cardiovascular system: S1 & S2 heard, RRR. No JVD, murmurs, gallops, clicks or pedal edema.Telemetry: Sinus tachycardia  Gastrointestinal system: Abdomen is nondistended, soft and nontender. Normal bowel sounds heard.  Central nervous system: Alert and oriented. No focal neurological deficits.  Extremities: Symmetric 5 x 5 power.   Data Reviewed: Basic Metabolic Panel:  Recent Labs Lab 01/12/13 1610 01/12/13 1628 01/13/13 0445  NA 135 134* 137  K 4.2 4.3 3.7  CL 94* 100 99  CO2 19  --  26  GLUCOSE 77 79 85  BUN 6 4* 6  CREATININE 0.66 1.10 0.66  CALCIUM 8.8  --  9.0  MG 1.9  --   --    Liver Function Tests:  Recent Labs Lab 01/12/13 1610 01/13/13 0445  AST 106* 81*  ALT 49 41  ALKPHOS 100 92  BILITOT 0.3 0.7  PROT 8.0 7.4  ALBUMIN 3.8 3.6   No results found for this basename: LIPASE, AMYLASE,  in the last 168 hours No results found for this basename: AMMONIA,  in the last 168  hours CBC:  Recent Labs Lab 01/12/13 1612 01/12/13 1628 01/13/13 0445 01/14/13 0410  WBC 2.6*  --  3.0* 4.6  NEUTROABS  --   --  1.5*  --   HGB 10.8* 12.6* 9.6* 9.2*  HCT 31.6* 37.0* 28.0* 27.8*  MCV 78.4  --  77.8* 79.2  PLT 214  --  180 154   Cardiac Enzymes: No results found for this basename: CKTOTAL, CKMB, CKMBINDEX, TROPONINI,  in the last 168 hours BNP (last 3 results) No results found for this basename: PROBNP,  in the last 8760 hours CBG: No results found for this basename: GLUCAP,  in the last 168 hours  No results found for this or any previous visit (from the past 240 hour(s)).   Studies: No results found.   Additional labs:   Scheduled Meds: . brimonidine  1 drop Both Eyes BID  . budesonide-formoterol  2 puff Inhalation BID  . ferrous sulfate  325 mg Oral Q breakfast  . folic acid  1 mg Oral Daily  . LORazepam  0-4 mg Oral Q6H   Followed by  . LORazepam  0-4 mg Oral Q12H  . multivitamin with minerals  1 tablet Oral Daily  . nicotine  21 mg Transdermal Daily  . sodium chloride  3 mL Intravenous Q12H  . thiamine  100 mg Oral Daily  . traZODone  50 mg Oral QHS  . vitamin B-12  100 mcg Oral Daily   Continuous Infusions:    Principal Problem:   Syncope Active Problems:   COPD (chronic obstructive pulmonary disease)   Alcohol intoxication   Lactic acidosis   Cirrhosis   History of stroke   Leukopenia   Anemia   Orthostatic hypotension    Time spent: 30 minutes    [facility]  [facility] Pager [phone].   If 8PM-8AM, please contact night-coverage at [email or web address], password [facility] 01/14/2013, 4:39 PM  LOS: 2 days

## 2013-01-14 NOTE — Progress Notes (Signed)
Physical Therapy Treatment Patient Details Name: Peter [name] MRN: [id number] DOB: 1951-04-23 Today's Date: 01/14/2013 Time: 7829-5621 PT Time Calculation (min): 25 min  PT Assessment / Plan / Recommendation  PT Comments   Pt in bed with personal private duty Aide in room.  Pt asked for a cigarette. Pt's mother also present. Assisted pt OOB to amb in hallway.  Pt required increased time and extra VC's for direction during amb.  Unsteady gait.   Follow Up Recommendations  Home health PT;Supervision/Assistance - 24 hour     Does the patient have the potential to tolerate intense rehabilitation     Barriers to Discharge        Equipment Recommendations       Recommendations for Other Services    Frequency Min 3X/week   Progress towards PT Goals Progress towards PT goals: Progressing toward goals  Plan      Precautions / Restrictions Precautions Precautions: Fall    Pertinent Vitals/Pain No c/o pain    Mobility  Bed Mobility Bed Mobility: Supine to Sit Supine to Sit: 4: Min assist;HOB elevated Details for Bed Mobility Assistance: cues for technique and  requires incr time.  Pt noted with tremors throughout.  Pt stated, "I need a cigarette'. Transfers Transfers: Sit to Stand;Stand to Sit Sit to Stand: 4: Min assist;From bed Stand to Sit: 4: Min assist;To chair/3-in-1 Details for Transfer Assistance: cues for hand placement and wt shift; initial posterior LOB Ambulation/Gait Ambulation/Gait Assistance: 4: Min assist;3: Mod assist Ambulation Distance (Feet): 175 Feet Assistive device: Rolling walker Ambulation/Gait Assistance Details: Very unsteady, shaky gait requiring 50% VC's for diection of RW and safety. RA sats lowest 90% and HR increased to 160's so amb ceased and positioned pt in recliner. Gait Pattern: Step-through pattern;Decreased stride length;Wide base of support     PT Goals (current goals can now be found in the care plan section)    Visit Information  Last PT Received On: 01/14/13 History of Present Illness: Peter [name] is a 62 y.o. male alcoholic who was brought to the emergency room by his caregiver with multiple episodes of near syncope. He reports that when he stands up, he feels weak and nearly loses consciousness.  Pt blood alcohol level on adm was 256    Subjective Data      Cognition    impaired safety cognition   Balance   poor  End of Session PT - End of Session Equipment Utilized During Treatment: Gait belt Activity Tolerance: Patient tolerated treatment well Patient left: in chair;with call bell/phone within reach;with family/visitor present   [name]  PTA [facility]  Acute  Rehab Pager      [phone]

## 2013-01-14 NOTE — Progress Notes (Signed)
Echocardiogram 2D Echocardiogram has been performed.  Peter [name] 01/14/2013, 10:16 AM

## 2013-01-14 NOTE — Progress Notes (Signed)
Pt becoming increasingly agitated, HR in ST, pt restless, and refusing to stay in bed. Dr. Waymon Amato notified. One time order of Ativan 1mg  given. Will administer and continue to monitor pt.

## 2013-01-14 NOTE — Progress Notes (Signed)
*  PRELIMINARY RESULTS* Vascular Ultrasound Carotid Doppler has been completed.  Preliminary findings: Bilateral:  Less than 39% ICA stenosis.  Vertebral artery flow is antegrade.      Farrel Demark, RDMS, RVT  01/14/2013, 9:24 AM

## 2013-01-14 NOTE — Progress Notes (Signed)
Pt remaining agitated, restless, attempting to get out of bed, ST, family present at bedside. Dr. Waymon Amato again notified. Orders given to administer additional 1mg  of Ativan IV. Will administer and continue to monitor pt.

## 2013-01-15 ENCOUNTER — Inpatient Hospital Stay (HOSPITAL_COMMUNITY): Payer: Medicaid Other

## 2013-01-15 DIAGNOSIS — F10231 Alcohol dependence with withdrawal delirium: Secondary | ICD-10-CM

## 2013-01-15 DIAGNOSIS — F10931 Alcohol use, unspecified with withdrawal delirium: Secondary | ICD-10-CM

## 2013-01-15 DIAGNOSIS — E876 Hypokalemia: Secondary | ICD-10-CM

## 2013-01-15 DIAGNOSIS — R509 Fever, unspecified: Secondary | ICD-10-CM

## 2013-01-15 LAB — URINALYSIS, ROUTINE W REFLEX MICROSCOPIC
Glucose, UA: NEGATIVE mg/dL
Ketones, ur: NEGATIVE mg/dL
Nitrite: POSITIVE — AB
Protein, ur: 30 mg/dL — AB

## 2013-01-15 LAB — COMPREHENSIVE METABOLIC PANEL
ALT: 29 U/L (ref 0–53)
AST: 51 U/L — ABNORMAL HIGH (ref 0–37)
Albumin: 3.6 g/dL (ref 3.5–5.2)
CO2: 24 mEq/L (ref 19–32)
Calcium: 9.6 mg/dL (ref 8.4–10.5)
Creatinine, Ser: 1.27 mg/dL (ref 0.50–1.35)
GFR calc non Af Amer: 60 mL/min — ABNORMAL LOW (ref >90)
Sodium: 138 mEq/L (ref 135–145)
Total Protein: 7.5 g/dL (ref 6.0–8.3)

## 2013-01-15 LAB — MRSA PCR SCREENING: MRSA by PCR: NEGATIVE

## 2013-01-15 LAB — CBC
HCT: 27.5 % — ABNORMAL LOW (ref 39.0–52.0)
Hemoglobin: 9.4 g/dL — ABNORMAL LOW (ref 13.0–17.0)
MCH: 27.4 pg (ref 26.0–34.0)
MCHC: 34.2 g/dL (ref 30.0–36.0)
MCV: 80.2 fL (ref 78.0–100.0)
RBC: 3.43 MIL/uL — ABNORMAL LOW (ref 4.22–5.81)

## 2013-01-15 LAB — URINE MICROSCOPIC-ADD ON

## 2013-01-15 MED ORDER — LEVALBUTEROL HCL 0.63 MG/3ML IN NEBU: 0.6300 mg | INHALATION_SOLUTION | RESPIRATORY_TRACT | Status: DC | PRN

## 2013-01-15 MED ORDER — LORAZEPAM 2 MG/ML IJ SOLN
INTRAMUSCULAR | Status: AC
  Filled 2013-01-15: qty 1

## 2013-01-15 MED ORDER — POTASSIUM CHLORIDE 10 MEQ/100ML IV SOLN
10.0000 meq | INTRAVENOUS | Status: AC
  Administered 2013-01-15 (×6): 10 meq via INTRAVENOUS
  Filled 2013-01-15 (×6): qty 100

## 2013-01-15 MED ORDER — LORAZEPAM 2 MG/ML IJ SOLN
2.0000 mg | INTRAMUSCULAR | Status: DC | PRN
  Administered 2013-01-15: 4 mg via INTRAVENOUS
  Administered 2013-01-15 – 2013-01-16 (×8): 2 mg via INTRAVENOUS
  Filled 2013-01-15: qty 1
  Filled 2013-01-15: qty 2
  Filled 2013-01-15 (×7): qty 1

## 2013-01-15 MED ORDER — MAGNESIUM SULFATE 40 MG/ML IJ SOLN
4.0000 g | Freq: Once | INTRAMUSCULAR | Status: AC
  Administered 2013-01-15: 4 g via INTRAVENOUS
  Filled 2013-01-15: qty 100

## 2013-01-15 MED ORDER — POTASSIUM CHLORIDE CRYS ER 20 MEQ PO TBCR
40.0000 meq | EXTENDED_RELEASE_TABLET | ORAL | Status: DC
  Filled 2013-01-15 (×2): qty 2

## 2013-01-15 MED ORDER — ACETAMINOPHEN 650 MG RE SUPP
650.0000 mg | Freq: Four times a day (QID) | RECTAL | Status: DC | PRN
  Administered 2013-01-15 – 2013-01-16 (×3): 650 mg via RECTAL
  Filled 2013-01-15 (×3): qty 1

## 2013-01-15 MED ORDER — DEXTROSE 5 % IV SOLN
500.0000 mg | Freq: Every day | INTRAVENOUS | Status: DC
  Administered 2013-01-15: 500 mg via INTRAVENOUS
  Filled 2013-01-15: qty 500

## 2013-01-15 MED ORDER — THIAMINE HCL 100 MG/ML IJ SOLN
100.0000 mg | Freq: Every day | INTRAMUSCULAR | Status: DC
  Administered 2013-01-15 – 2013-01-16 (×2): 100 mg via INTRAVENOUS
  Filled 2013-01-15 (×3): qty 1

## 2013-01-15 MED ORDER — LORAZEPAM 2 MG/ML IJ SOLN
2.0000 mg | Freq: Once | INTRAMUSCULAR | Status: AC
  Administered 2013-01-15: 2 mg via INTRAVENOUS

## 2013-01-15 MED ORDER — PANTOPRAZOLE SODIUM 40 MG IV SOLR
40.0000 mg | INTRAVENOUS | Status: DC
  Administered 2013-01-15 – 2013-01-16 (×2): 40 mg via INTRAVENOUS
  Filled 2013-01-15 (×2): qty 40

## 2013-01-15 MED ORDER — MAGNESIUM SULFATE 40 MG/ML IJ SOLN
4.0000 g | Freq: Once | INTRAMUSCULAR | Status: DC
  Filled 2013-01-15: qty 100

## 2013-01-15 MED ORDER — DEXTROSE 5 % IV SOLN
1.0000 g | Freq: Every day | INTRAVENOUS | Status: DC
  Administered 2013-01-15: 1 g via INTRAVENOUS
  Filled 2013-01-15: qty 10

## 2013-01-15 NOTE — Progress Notes (Signed)
Pt responded well to Ativan 4mg  PO, now asleep. CXR and Blood Cultures done as ordered.

## 2013-01-15 NOTE — Progress Notes (Signed)
Pt was very aggitated and restless. Pt was hallucinating and hearing voices. Pt CIWA was 35. Paged NP on call and was ordered to give 4mg  of ativan IV. Medication given and will continue to monitor.

## 2013-01-15 NOTE — Progress Notes (Signed)
[facility] PROGRESS NOTE  Peter [name] WGN:[id number] DOB: 1951-06-14 DOA: 01/12/2013 PCP: Cameron Sprang, FNP  Brief narrative 62 year old male patient with history of alcohol dependence, cirrhosis, HTN, HL, emphysema, possible dementia who has 24/7 home care by cousin, who was brought to the [facility] ED on 01/12/13 due to multiple episodes of falls associated with syncope. He also complained of dizziness on standing. He apparently eats poorly and had periodic vomiting without hematemesis or melena. In the ED, BAL 256, venous lactate 3.9, CT head without acute findings. Hospitalist admission was requested.  Assessment/Plan: 1. Alcohol withdrawal DTs/alcohol dependence/alcohol intoxication on admission: Patient initially came in with alcohol intoxication. For the first 2 days he did not have significant withdrawal features. Since 01/14/13, she started having withdrawal features which progressively got worse over the course of the night. He received approximately 24 mg of Ativan over 12 hour period last night. Will transfer patient to  Step down unit for close monitoring and management.change Ativan to 2-4 mg IV every 4 hourly when necessary for CIWA greater than 8. N.p.o. until consistently alert and able to take by mouth. If patient does not settle with about management, may need a Precedex drip. Discussed with critical care M.D. 2. Hypokalemia and hypomagnesemia: Replete IV and follow BMP and magnesium  3. Fever: Could be secondary to DTs or? Aspiration. Chest x-ray unremarkable. Blood cultures x2 sent. Monitor off antibiotics. However if he spikes again then consider IV antibiotics.  4. Syncope/recurrent falls: Possibly from alcohol intoxication/orthostatic hypotension. 2-D echo shows mildly reduced LVEF 45-50%. Carotid Dopplers: Bilateral <39% ICA stenosis. PT and OT evaluation appreciated. CT head negative. May need outpatient cardiology evaluation for mildly reduced  EF. 5. Orthostatic hypotension: Possibly from intravascular volume depletion. Continue gentle IV fluids for additional 24 hours and monitor. 6. Leukopenia, microcytic anemia & Thrombocytopenia: Likely secondary to alcohol abuse & cirrhosis. Leukopenia resolved. Anemia stable.Follow daily CBC.  7. Emphysema: Stable.When necessary oxygen and bronchodilator nebulizations.  8. HTN:  periodic spikes in blood pressure secondary to alcohol withdrawal. Treat alcohol withdrawal.  9. Lactic acidosis: Present on admission. Improved/resolved. Unclear etiology-Likely related to alcohol.. 10. Possible dementia: Patient states that he has Alzheimer's. This could be related to alcohol abuse.   DVT prophylaxis: SCDs GI prophylaxis: IV Protonix. Nutrition: N.p.o. until patient consistently alert and coherent. IVF.    Code Status: Full Family Communication: Discussed with mother at bedside. She states that patient lives at his home and has almost 24/7  Supervision/care by Ms. Peter [name] who is a Engineer, civil (consulting) and also a family friend. She states that they have not been able to prevent patient from accessing alcohol-he gets verbally abusive. He apparently has a large circle of drinking buddies. Advised her that if he continues to drink alcohol, problem similar to current hospitalization will definitely recur and which could be dangerous and could even lead to death. She seems helpless in preventing him from drinking. He has apparently been in alcohol rehabilitation in [place].  Disposition Plan:  Transferred to step down unit.   Consultants:  None  Procedures:  None  Antibiotics:  None   HPI/Subjective: Patient currently sedated. Overnight events appreciated.  Extreme agitation, nausea, vomiting, tachycardia, elevated blood pressures and fever. He required substantial amount of lorazepam.   Objective: Filed Vitals:   01/15/13 0800 01/15/13 0807 01/15/13 1200 01/15/13 1211  BP: 105/64  143/88   Pulse: 118   99   Temp: 98.8 F (37.1 C)   100.3 F (37.9 C)  TempSrc: Axillary   Oral  Resp:   25   Height:      Weight:      SpO2:  95% 100%     Intake/Output Summary (Last 24 hours) at 01/15/13 1306 Last data filed at 01/15/13 1219  Gross per 24 hour  Intake 1553.33 ml  Output   2200 ml  Net -646.67 ml   Filed Weights   01/12/13 2046  Weight: 62.1 kg (136 lb 14.5 oz)    Exam:   General exam: Chronically ill-looking. Currently appears comfortable and sedated   Respiratory system:  poor inspiratory effort. Decreased breath sounds in the bases but otherwise clear to auscultation. No increased work of breathing.  Cardiovascular system: S1 & S2 heard, regular and mildly tachycardic . No JVD, murmurs, gallops, clicks or pedal edema.Telemetry: Sinus tachycardia/? SVT overnight in the 160s-170s which is improved this morning to ST in the 100s.   Gastrointestinal system: Abdomen is nondistended, soft and nontender. Normal bowel sounds heard.  Central nervous system:  sedated but will briefly open eyes to call the drift back to sleep . No focal neurological deficits.  Extremities: Symmetric 5 x 5 power.   Data Reviewed: Basic Metabolic Panel:  Recent Labs Lab 01/12/13 1610 01/12/13 1628 01/13/13 0445 01/15/13 0537  NA 135 134* 137 138  K 4.2 4.3 3.7 2.9*  CL 94* 100 99 100  CO2 19  --  26 24  GLUCOSE 77 79 85 122*  BUN 6 4* 6 7  CREATININE 0.66 1.10 0.66 1.27  CALCIUM 8.8  --  9.0 9.6  MG 1.9  --   --  1.4*   Liver Function Tests:  Recent Labs Lab 01/12/13 1610 01/13/13 0445 01/15/13 0537  AST 106* 81* 51*  ALT 49 41 29  ALKPHOS 100 92 89  BILITOT 0.3 0.7 1.0  PROT 8.0 7.4 7.5  ALBUMIN 3.8 3.6 3.6   No results found for this basename: LIPASE, AMYLASE,  in the last 168 hours No results found for this basename: AMMONIA,  in the last 168 hours CBC:  Recent Labs Lab 01/12/13 1612 01/12/13 1628 01/13/13 0445 01/14/13 0410 01/15/13 0537  WBC 2.6*  --  3.0*  4.6 11.4*  NEUTROABS  --   --  1.5*  --   --   HGB 10.8* 12.6* 9.6* 9.2* 9.4*  HCT 31.6* 37.0* 28.0* 27.8* 27.5*  MCV 78.4  --  77.8* 79.2 80.2  PLT 214  --  180 154 134*   Cardiac Enzymes: No results found for this basename: CKTOTAL, CKMB, CKMBINDEX, TROPONINI,  in the last 168 hours BNP (last 3 results) No results found for this basename: PROBNP,  in the last 8760 hours CBG: No results found for this basename: GLUCAP,  in the last 168 hours  No results found for this or any previous visit (from the past 240 hour(s)).   Studies: Dg Chest Port 1 View  01/15/2013   *RADIOLOGY REPORT*  Clinical Data: Fever.  PORTABLE CHEST - 1 VIEW  Comparison: 01/12/2013  Findings: Slightly lower lung volumes noted bilaterally with bibasilar atelectasis present.  No overt consolidation, edema or pleural fluid is identified.  There remains evidence of underlying COPD.  The heart size is within normal limits.  IMPRESSION: Low lung volumes with bibasilar atelectasis present.   Original Report Authenticated By: Irish Lack, M.D.     Additional labs:   Scheduled Meds: . brimonidine  1 drop Both Eyes BID  . budesonide-formoterol  2 puff  Inhalation BID  . folic acid  1 mg Oral Daily  . LORazepam      . magnesium sulfate 1 - 4 g bolus IVPB  4 g Intravenous Once  . multivitamin with minerals  1 tablet Oral Daily  . nicotine  21 mg Transdermal Daily  . potassium chloride  10 mEq Intravenous Q1 Hr x 6  . sodium chloride  3 mL Intravenous Q12H  . thiamine  100 mg Intravenous Daily  . vitamin B-12  100 mcg Oral Daily   Continuous Infusions: . 0.9 % NaCl with KCl 20 mEq / L 100 mL (01/14/13 1930)    Principal Problem:   Syncope Active Problems:   COPD (chronic obstructive pulmonary disease)   Alcohol intoxication   Lactic acidosis   Cirrhosis   History of stroke   Leukopenia   Anemia   Orthostatic hypotension   Alcohol withdrawal    Time spent: 60 minutes    [facility]  [facility] Pager [phone].   If 8PM-8AM, please contact night-coverage at [email or web address], password [facility] 01/15/2013, 1:06 PM  LOS: 3 days

## 2013-01-15 NOTE — Progress Notes (Signed)
Pt finally is sleeping. HR still in 130-140's. NP aware. Will continue to monitor.

## 2013-01-15 NOTE — Progress Notes (Signed)
Per report from night nurse pt in withdrawals and symptoms go from one extreme to another quickly; states any stimulation agitates pt quickly. States she spoke to MD during the night, and declined request to transfer pt to stepdown unit. Pt was sleeping at 7am, but now awake asking for beer and to smoke, pulling trying to get out of bed. CIWA 16. Family member at bedside states it has been an eventful night, sweating better, but profuse during the night. BP improved, and HR as well. Will monitor closely.

## 2013-01-15 NOTE — Progress Notes (Signed)
Transferred to 1224. Pt aroused and states he wants a beer and go to the bathroom. Placed on monitor, BP 77/58, HR 101. ICU nurse at bedside.

## 2013-01-15 NOTE — Progress Notes (Addendum)
8:50 pm Received a call about patient's temperature being 103.1.  Patient breathing 35/min.  Urine dark.  Believe the patient is in alcohol withdraw.  He was admitted on 7/9 so it has been approximately 3 days since his last drink.  RN has already given ativan.  Will give tylenol PR (patient is NPO),  Check blood cultures, Check u/a.  U/a from 7/9 was clean.  CXR was just done this am.  Patient is on fluids at 100/hour.  BP stable.  CXR this am showed bibasilar atelectasis.  WBC rose today.    Dr. Richardean Chimera note mentions starting IV antibiotics if he has another spike in temp. Given rise in WBC and 103.1 Fever will start IV rocephin and azithromycin empirically.  Algis Downs, PA-C [facility] Pager: [phone]

## 2013-01-15 NOTE — Progress Notes (Signed)
Pt. Woke up from sleep and began shaking uncontrollable and vomiting. 2 mg ativan given and pt still shaking. Paged NP, [place] and another 2mg  was ordered. Ativan given and will monitor patient closely.

## 2013-01-16 ENCOUNTER — Inpatient Hospital Stay (HOSPITAL_COMMUNITY): Payer: Medicaid Other

## 2013-01-16 DIAGNOSIS — J690 Pneumonitis due to inhalation of food and vomit: Secondary | ICD-10-CM

## 2013-01-16 DIAGNOSIS — N390 Urinary tract infection, site not specified: Secondary | ICD-10-CM

## 2013-01-16 LAB — BASIC METABOLIC PANEL
BUN: 8 mg/dL (ref 6–23)
Calcium: 9 mg/dL (ref 8.4–10.5)
GFR calc non Af Amer: >90 mL/min (ref >90)
Glucose, Bld: 94 mg/dL (ref 70–99)

## 2013-01-16 LAB — CBC
MCH: 26.7 pg (ref 26.0–34.0)
MCHC: 33.1 g/dL (ref 30.0–36.0)
Platelets: 137 10*3/uL — ABNORMAL LOW (ref 150–400)

## 2013-01-16 MED ORDER — VANCOMYCIN HCL IN DEXTROSE 1-5 GM/200ML-% IV SOLN
1000.0000 mg | Freq: Once | INTRAVENOUS | Status: AC
  Administered 2013-01-16: 1000 mg via INTRAVENOUS
  Filled 2013-01-16: qty 200

## 2013-01-16 MED ORDER — VANCOMYCIN HCL IN DEXTROSE 750-5 MG/150ML-% IV SOLN
750.0000 mg | Freq: Two times a day (BID) | INTRAVENOUS | Status: DC
  Administered 2013-01-16 – 2013-01-19 (×6): 750 mg via INTRAVENOUS
  Filled 2013-01-16 (×7): qty 150

## 2013-01-16 MED ORDER — PIPERACILLIN-TAZOBACTAM 3.375 G IVPB
3.3750 g | Freq: Three times a day (TID) | INTRAVENOUS | Status: DC
  Administered 2013-01-16 – 2013-01-19 (×10): 3.375 g via INTRAVENOUS
  Filled 2013-01-16 (×11): qty 50

## 2013-01-16 NOTE — Progress Notes (Signed)
[facility] PROGRESS NOTE  Peter [name] ZOX:[id number] DOB: May 23, 1951 DOA: 01/12/2013 PCP: Cameron Sprang, FNP  Brief narrative 62 year old male patient with history of alcohol dependence, cirrhosis, HTN, HL, emphysema, possible dementia who has 24/7 home care by cousin, who was brought to the [facility] ED on 01/12/13 due to multiple episodes of falls associated with syncope. He also complained of dizziness on standing. He apparently eats poorly and had periodic vomiting without hematemesis or melena. In the ED, BAL 256, venous lactate 3.9, CT head without acute findings. Approximately 2 days into admission, patient developed florid alcohol withdrawal/DTs and was transferred to step down unit for close monitoring and management on 01/15/13. Develop fever possible aspiration pneumonia +/- UTI.  Assessment/Plan: 1. Alcohol withdrawal DTs/alcohol dependence/alcohol intoxication on admission: Patient initially came in with alcohol intoxication. For the first 2 days he did not have significant withdrawal features. Since 01/14/13, she started having withdrawal features which progressively got worse. He received approximately 24 mg of Ativan over 12 hour period 7/11 night. Transferred patient to Step down unit for close monitoring and management.Canged Ativan to 2-4 mg IV every 4 hourly when necessary for CIWA greater than 8. N.p.o. until consistently alert and able to take by mouth. If patient does not settle with about management, may need a Precedex drip. Improving. Continue current care. 2. Hypokalemia and hypomagnesemia: Repleted IV on 3/12. FU Mg. 3. Fever/PNA (Aspiration PNA > HCAP) +/- UTI: Chest x-ray unremarkable on 7/12. Blood cultures x2 sent. Will change Abx to Zosyn & Vancomycin. Monitor. 4. Syncope/recurrent falls: Possibly from alcohol intoxication/orthostatic hypotension. 2-D echo shows mildly reduced LVEF 45-50%. Carotid Dopplers: Bilateral <39% ICA stenosis. PT and OT evaluation  appreciated. CT head negative. May need outpatient cardiology evaluation for mildly reduced EF. 5. Orthostatic hypotension: Possibly from intravascular volume depletion. Continue gentle IV fluids. 6. Leukopenia, microcytic anemia & Thrombocytopenia: Likely secondary to alcohol abuse & cirrhosis. Leukopenia resolved. Platelets stable. Small drop in Hb- ? Dilutional- no active bleeding. FU CBC in am and transfuse if Hb < 7 gm/dl. 7. Emphysema: Stable.When necessary oxygen and bronchodilator nebulizations.  8. HTN:  periodic spikes in blood pressure secondary to alcohol withdrawal. Treat alcohol withdrawal. Improved. 9. Lactic acidosis: Present on admission. Improved/resolved. Unclear etiology-Likely related to alcohol.. 10. Possible dementia: Patient states that he has Alzheimer's. This could be related to alcohol abuse.   DVT prophylaxis: SCDs GI prophylaxis: IV Protonix. Nutrition: N.p.o. until patient consistently alert and coherent. IVF.    Code Status: Full Family Communication: None today. Disposition Plan:  Continue care in step down unit.   Consultants:  None  Procedures:  None  Antibiotics:  IV Rocephin 7/12 >7/13  IV Azithromycin 7/12 > 7/13  IV Zosyn 7/13 >  IV Vancomycin 7/13 >  HPI/Subjective: Patient looks much better than he did 7/12 AM. Drowsy but arousable-"I'm thirsty". Per nursing, patient is definitely improving and better than yesterday-less withdrawal. Overnight temperature spikes. Intermittent coughing.   Objective: Filed Vitals:   01/16/13 0000 01/16/13 0200 01/16/13 0400 01/16/13 0600  BP: 102/59 128/79 138/80 155/94  Pulse: 102 108 99 113  Temp: 100.4 F (38 C)  100 F (37.8 C)   TempSrc: Oral  Oral   Resp: 28 24 21 26   Height:      Weight:   64 kg (141 lb 1.5 oz)   SpO2: 98% 98% 98% 97%    Intake/Output Summary (Last 24 hours) at 01/16/13 0800 Last data filed at 01/16/13 0529  Gross per 24  hour  Intake   2803 ml  Output   1050 ml   Net   1753 ml   Filed Weights   01/12/13 2046 01/16/13 0400  Weight: 62.1 kg (136 lb 14.5 oz) 64 kg (141 lb 1.5 oz)    Exam:   General exam: Chronically ill-looking. Currently appears comfortable and sedated   Respiratory system:  poor inspiratory effort. Decreased breath sounds in the bases but otherwise clear to auscultation. No increased work of breathing.  Cardiovascular system: S1 & S2 heard, regular and mildly tachycardic . No JVD, murmurs, gallops, clicks or pedal edema.Telemetry: Sinus tachycardia  Gastrointestinal system: Abdomen is nondistended, soft and nontender. Normal bowel sounds heard.  Central nervous system:  sedated but  arousable and oriented to self. No focal neurological deficits.  Extremities: Symmetric 5 x 5 power.   Data Reviewed: Basic Metabolic Panel:  Recent Labs Lab 01/12/13 1610 01/12/13 1628 01/13/13 0445 01/15/13 0537 01/16/13 0351  NA 135 134* 137 138 136  K 4.2 4.3 3.7 2.9* 3.7  CL 94* 100 99 100 104  CO2 19  --  26 24 22   GLUCOSE 77 79 85 122* 94  BUN 6 4* 6 7 8   CREATININE 0.66 1.10 0.66 1.27 0.87  CALCIUM 8.8  --  9.0 9.6 9.0  MG 1.9  --   --  1.4*  --    Liver Function Tests:  Recent Labs Lab 01/12/13 1610 01/13/13 0445 01/15/13 0537  AST 106* 81* 51*  ALT 49 41 29  ALKPHOS 100 92 89  BILITOT 0.3 0.7 1.0  PROT 8.0 7.4 7.5  ALBUMIN 3.8 3.6 3.6   No results found for this basename: LIPASE, AMYLASE,  in the last 168 hours No results found for this basename: AMMONIA,  in the last 168 hours CBC:  Recent Labs Lab 01/12/13 1612 01/12/13 1628 01/13/13 0445 01/14/13 0410 01/15/13 0537 01/16/13 0351  WBC 2.6*  --  3.0* 4.6 11.4* 10.1  NEUTROABS  --   --  1.5*  --   --   --   HGB 10.8* 12.6* 9.6* 9.2* 9.4* 8.8*  HCT 31.6* 37.0* 28.0* 27.8* 27.5* 26.6*  MCV 78.4  --  77.8* 79.2 80.2 80.9  PLT 214  --  180 154 134* 137*   Cardiac Enzymes: No results found for this basename: CKTOTAL, CKMB, CKMBINDEX, TROPONINI,  in  the last 168 hours BNP (last 3 results) No results found for this basename: PROBNP,  in the last 8760 hours CBG: No results found for this basename: GLUCAP,  in the last 168 hours  Recent Results (from the past 240 hour(s))  MRSA PCR SCREENING     Status: None   Collection Time    01/15/13 11:47 AM      Result Value Range Status   MRSA by PCR NEGATIVE  NEGATIVE Final   Comment:            The GeneXpert MRSA Assay (FDA     approved for NASAL specimens     only), is one component of a     comprehensive MRSA colonization     surveillance program. It is not     intended to diagnose MRSA     infection nor to guide or     monitor treatment for     MRSA infections.     Studies: Dg Chest Port 1 View  01/15/2013   *RADIOLOGY REPORT*  Clinical Data: Fever.  PORTABLE CHEST - 1 VIEW  Comparison:  01/12/2013  Findings: Slightly lower lung volumes noted bilaterally with bibasilar atelectasis present.  No overt consolidation, edema or pleural fluid is identified.  There remains evidence of underlying COPD.  The heart size is within normal limits.  IMPRESSION: Low lung volumes with bibasilar atelectasis present.   Original Report Authenticated By: Irish Lack, M.D.     Additional labs:   Scheduled Meds: . brimonidine  1 drop Both Eyes BID  . budesonide-formoterol  2 puff Inhalation BID  . folic acid  1 mg Oral Daily  . multivitamin with minerals  1 tablet Oral Daily  . nicotine  21 mg Transdermal Daily  . pantoprazole (PROTONIX) IV  40 mg Intravenous Q24H  . sodium chloride  3 mL Intravenous Q12H  . thiamine  100 mg Intravenous Daily  . vitamin B-12  100 mcg Oral Daily   Continuous Infusions: . 0.9 % NaCl with KCl 20 mEq / L 100 mL/hr at 01/16/13 0457    Principal Problem:   Syncope Active Problems:   COPD (chronic obstructive pulmonary disease)   Alcohol intoxication   Lactic acidosis   Cirrhosis   History of stroke   Leukopenia   Anemia   Orthostatic hypotension    Alcohol withdrawal   Hypokalemia   Hypomagnesemia   Fever, unspecified    Time spent: 40 minutes    [facility]  [facility] Pager [phone].   If 8PM-8AM, please contact night-coverage at [email or web address], password [email or web address] 01/16/2013, 8:00 AM  LOS: 4 days

## 2013-01-16 NOTE — Progress Notes (Signed)
ANTIBIOTIC CONSULT NOTE - INITIAL  Pharmacy Consult for Vancomycin and Zosyn Indication: Rule out HCAP, Aspiration, UTI  Allergies  Allergen Reactions  . Poison Ivy Extract (Extract Of Poison Ivy) Rash    Patient Measurements: Height: 5\' 4"  (162.6 cm) Weight: 141 lb 1.5 oz (64 kg) IBW/kg (Calculated) : 59.2  Vital Signs: Temp: 101.9 F (38.8 C) (07/13 0800) Temp src: Oral (07/13 0800) BP: 155/94 mmHg (07/13 0600) Pulse Rate: 113 (07/13 0600) Intake/Output from previous day: 07/12 0701 - 07/13 0700 In: 2803 [I.V.:1803; IV Piggyback:1000] Out: 1050 [Urine:1050] Intake/Output from this shift:    Labs:  Recent Labs  01/14/13 0410 01/15/13 0537 01/16/13 0351  WBC 4.6 11.4* 10.1  HGB 9.2* 9.4* 8.8*  PLT 154 134* 137*  CREATININE  --  1.27 0.87   Estimated Creatinine Clearance: 76.6 ml/min (by C-G formula based on Cr of 0.87).  92 ml/min/1.64m2 (normalized)    Microbiology: Recent Results (from the past 720 hour(s))  MRSA PCR SCREENING     Status: None   Collection Time    01/15/13 11:47 AM      Result Value Range Status   MRSA by PCR NEGATIVE  NEGATIVE Final   Comment:            The GeneXpert MRSA Assay (FDA     approved for NASAL specimens     only), is one component of a     comprehensive MRSA colonization     surveillance program. It is not     intended to diagnose MRSA     infection nor to guide or     monitor treatment for     MRSA infections.    Medical History: Past Medical History  Diagnosis Date  . Hypertension   . Hyperlipemia   . Emphysema   . Stroke   . Chronic pain   . Cirrhosis of liver   . Fall   . Dental injury   . Bone spur of other site     Left Foot  . Impaired mobility   . Total self care deficit   . Pneumonia   . Emphysema   . ETOH abuse   . Elevated LFTs   . Arthritis   . Anxiety   . Sinusitis   . Anemia     iron def.  . Hard of hearing     Medications:  Scheduled:  . brimonidine  1 drop Both Eyes BID  .  budesonide-formoterol  2 puff Inhalation BID  . folic acid  1 mg Oral Daily  . multivitamin with minerals  1 tablet Oral Daily  . nicotine  21 mg Transdermal Daily  . pantoprazole (PROTONIX) IV  40 mg Intravenous Q24H  . sodium chloride  3 mL Intravenous Q12H  . thiamine  100 mg Intravenous Daily  . vitamin B-12  100 mcg Oral Daily   Infusions:  . 0.9 % NaCl with KCl 20 mEq / L 100 mL/hr at 01/16/13 0457   Assessment: 62 year old male patient with history of alcohol dependence, cirrhosis, HTN, HL, emphysema, possible dementia, admitted on 01/12/13 due to multiple episodes of falls associated with syncope. Developed DTs and tx step down 7/12 for closer monitoring. Starting abx 7/13 for possible aspiration, HCAP, UTI.  Current weight 64kg  Tmax 101.3  SCr wnl 0.87, CrCl ~80 ml/min  Blood and urine cultures pending   Goal of Therapy:  Vancomycin trough level 15-20 mcg/ml  Plan:   Vancomycin 1gm IV x 1, then 750mg  IV  q12h Check trough at steady state Zosyn 3.375gm IV q8h (4hr extended infusions) Follow up renal function & cultures, de-escalation of therapy  Loralee Pacas, PharmD, BCPS Pager: [phone] 01/16/2013,8:33 AM

## 2013-01-17 LAB — CBC
HCT: 27 % — ABNORMAL LOW (ref 39.0–52.0)
Hemoglobin: 8.9 g/dL — ABNORMAL LOW (ref 13.0–17.0)
MCV: 80.4 fL (ref 78.0–100.0)
RDW: 16.7 % — ABNORMAL HIGH (ref 11.5–15.5)
WBC: 5.8 10*3/uL (ref 4.0–10.5)

## 2013-01-17 LAB — COMPREHENSIVE METABOLIC PANEL
Albumin: 2.7 g/dL — ABNORMAL LOW (ref 3.5–5.2)
Alkaline Phosphatase: 61 U/L (ref 39–117)
BUN: 7 mg/dL (ref 6–23)
CO2: 20 mEq/L (ref 19–32)
Chloride: 104 mEq/L (ref 96–112)
Creatinine, Ser: 0.74 mg/dL (ref 0.50–1.35)
GFR calc Af Amer: >90 mL/min (ref >90)
GFR calc non Af Amer: >90 mL/min (ref >90)
Glucose, Bld: 89 mg/dL (ref 70–99)
Total Bilirubin: 0.5 mg/dL (ref 0.3–1.2)

## 2013-01-17 MED ORDER — POTASSIUM CHLORIDE IN NACL 20-0.9 MEQ/L-% IV SOLN
INTRAVENOUS | Status: DC
  Administered 2013-01-17: 21:00:00 via INTRAVENOUS
  Filled 2013-01-17: qty 1000

## 2013-01-17 MED ORDER — PANTOPRAZOLE SODIUM 40 MG PO TBEC
40.0000 mg | DELAYED_RELEASE_TABLET | Freq: Every day | ORAL | Status: DC
  Administered 2013-01-17 – 2013-01-19 (×3): 40 mg via ORAL
  Filled 2013-01-17 (×3): qty 1

## 2013-01-17 MED ORDER — ACETAMINOPHEN 325 MG PO TABS: 650.0000 mg | ORAL_TABLET | Freq: Four times a day (QID) | ORAL | Status: DC | PRN

## 2013-01-17 MED ORDER — VITAMIN B-1 100 MG PO TABS
100.0000 mg | ORAL_TABLET | Freq: Every day | ORAL | Status: DC
Start: 2013-01-17 — End: 2013-01-19
  Administered 2013-01-17 – 2013-01-19 (×3): 100 mg via ORAL
  Filled 2013-01-17 (×3): qty 1

## 2013-01-17 NOTE — Progress Notes (Signed)
[facility] PROGRESS NOTE  Ezel Vallone UEA:[id number] DOB: [birth year] DOA: 01/12/2013 PCP: Cameron Sprang, FNP  Brief narrative 62 year old male patient with history of alcohol dependence, cirrhosis, HTN, HL, emphysema, possible dementia who has 24/7 home care by cousin, who was brought to the [facility] ED on 01/12/13 due to multiple episodes of falls associated with syncope. He also complained of dizziness on standing. He apparently eats poorly and had periodic vomiting without hematemesis or melena. In the ED, BAL 256, venous lactate 3.9, CT head without acute findings. Approximately 2 days into admission, patient developed florid alcohol withdrawal/DTs and was transferred to step down unit for close monitoring and management on 01/15/13. Develop fever possible aspiration pneumonia +/- UTI.  Assessment/Plan: 1. Alcohol withdrawal DTs/alcohol dependence/alcohol intoxication on admission: Patient initially came in with alcohol intoxication. For the first 2 days he did not have significant withdrawal features. Since 01/14/13, she started having withdrawal features which progressively got worse. He received approximately 24 mg of Ativan over 12 hour period 7/11 night. Transferred patient to Step down unit on 01/15/13 for close monitoring and management.Changed Ativan to 2-4 mg IV every 4 hourly when necessary for CIWA greater than 8. N.p.o. until consistently alert and able to take by mouth. Patient clinically much better -alert and oriented, blood pressure and heart rate improved and CIWA better. Continue current management and possibly transfer to telemetry later today. Resume diet. 2. Hypokalemia and hypomagnesemia: Repleted. 3. Fever/PNA (Aspiration PNA > HCAP) +/- UTI: Chest x-ray unremarkable on 7/12 & 7/13-basal atelectasis. Blood cultures x2 negative to date. Urine culture pending. Continue Zosyn & Vancomycin. Monitor. Defervesced. 4. Syncope/recurrent falls: Possibly from alcohol  intoxication/orthostatic hypotension. 2-D echo shows mildly reduced LVEF 45-50%. Carotid Dopplers: Bilateral <39% ICA stenosis. PT and OT evaluation appreciated. CT head negative. May need outpatient cardiology evaluation for mildly reduced EF. 5. Orthostatic hypotension: Possibly from intravascular volume depletion. Continue gentle IV fluids. 6. Leukopenia, microcytic anemia & Thrombocytopenia: Likely secondary to alcohol abuse & cirrhosis. Leukopenia resolved. Platelets stable. Small drop in Hb- ? Dilutional- no active bleeding. Hemoglobin stable. Thrombocytopenia resolved. 7. Emphysema: Stable.When necessary oxygen and bronchodilator nebulizations.  8. HTN:  periodic spikes in blood pressure secondary to alcohol withdrawal. Treat alcohol withdrawal. Improved. 9. Lactic acidosis: Present on admission. Improved/resolved. Unclear etiology-Likely related to alcohol.. 10. Possible dementia: Patient states that he has Alzheimer's. This could be related to alcohol abuse.   DVT prophylaxis: SCDs GI prophylaxis: IV Protonix. Nutrition: Regular diet   Code Status: Full Family Communication: Discussed with patient's caregiver, Ms. Blenda Bridegroom (CNA) at bedside.  Disposition Plan:  Continue care in step down unit-may transfer to telemetry later today.   Consultants:  None  Procedures:  None  Antibiotics:  IV Rocephin 7/12 >7/13  IV Azithromycin 7/12 > 7/13  IV Zosyn 7/13 >  IV Vancomycin 7/13 >  HPI/Subjective: Patient denies complaints. As per nursing, continues to improve and required a single dose of Ativan overnight. No agitation.  Objective: Filed Vitals:   01/16/13 2300 01/17/13 0000 01/17/13 0353 01/17/13 0400  BP: 136/87 140/88  154/98  Pulse: 106 100  93  Temp:   99.5 F (37.5 C)   TempSrc:   Oral   Resp: 30 23  25   Height:      Weight:      SpO2: 99% 98%  99%    Intake/Output Summary (Last 24 hours) at 01/17/13 0800 Last data filed at 01/17/13 0353  Gross per 24  hour  Intake  1050 ml  Output   1300 ml  Net   -250 ml   Filed Weights   01/12/13 2046 01/16/13 0400  Weight: 62.1 kg (136 lb 14.5 oz) 64 kg (141 lb 1.5 oz)    Exam:   General exam: Chronically ill-looking. Comfortable  Respiratory system:  Decreased breath sounds in the bases but otherwise clear to auscultation. No increased work of breathing.  Cardiovascular system: S1 & S2 heard, regular and mildly tachycardic . No JVD, murmurs, gallops, clicks or pedal edema.Telemetry: Sinus rhythm in the 90s-sinus tachycardia in the 100s.  Gastrointestinal system: Abdomen is nondistended, soft and nontender. Normal bowel sounds heard.  Central nervous system:  Alert and oriented to person, place and partly to time. No focal neurological deficits.  Extremities: Symmetric 5 x 5 power. Mild hand tremors.   Data Reviewed: Basic Metabolic Panel:  Recent Labs Lab 01/12/13 1610 01/12/13 1628 01/13/13 0445 01/15/13 0537 01/16/13 0351 01/17/13 0335  NA 135 134* 137 138 136 137  K 4.2 4.3 3.7 2.9* 3.7 3.7  CL 94* 100 99 100 104 104  CO2 19  --  26 24 22 20   GLUCOSE 77 79 85 122* 94 89  BUN 6 4* 6 7 8 7   CREATININE 0.66 1.10 0.66 1.27 0.87 0.74  CALCIUM 8.8  --  9.0 9.6 9.0 8.7  MG 1.9  --   --  1.4* 2.3  --    Liver Function Tests:  Recent Labs Lab 01/12/13 1610 01/13/13 0445 01/15/13 0537 01/17/13 0335  AST 106* 81* 51* 31  ALT 49 41 29 20  ALKPHOS 100 92 89 61  BILITOT 0.3 0.7 1.0 0.5  PROT 8.0 7.4 7.5 6.7  ALBUMIN 3.8 3.6 3.6 2.7*   No results found for this basename: LIPASE, AMYLASE,  in the last 168 hours No results found for this basename: AMMONIA,  in the last 168 hours CBC:  Recent Labs Lab 01/13/13 0445 01/14/13 0410 01/15/13 0537 01/16/13 0351 01/17/13 0335  WBC 3.0* 4.6 11.4* 10.1 5.8  NEUTROABS 1.5*  --   --   --   --   HGB 9.6* 9.2* 9.4* 8.8* 8.9*  HCT 28.0* 27.8* 27.5* 26.6* 27.0*  MCV 77.8* 79.2 80.2 80.9 80.4  PLT 180 154 134* 137* 167    Cardiac Enzymes: No results found for this basename: CKTOTAL, CKMB, CKMBINDEX, TROPONINI,  in the last 168 hours BNP (last 3 results) No results found for this basename: PROBNP,  in the last 8760 hours CBG: No results found for this basename: GLUCAP,  in the last 168 hours  Recent Results (from the past 240 hour(s))  CULTURE, BLOOD (ROUTINE X 2)     Status: None   Collection Time    01/15/13  8:27 AM      Result Value Range Status   Specimen Description BLOOD LEFT ARM   Final   Special Requests     Final   Value: BOTTLES DRAWN AEROBIC AND ANAEROBIC 3.5CC BOTH BOTTLES   Culture  Setup Time 01/15/2013 15:03   Final   Culture     Final   Value:        BLOOD CULTURE RECEIVED NO GROWTH TO DATE CULTURE WILL BE HELD FOR 5 DAYS BEFORE ISSUING A FINAL NEGATIVE REPORT   Report Status PENDING   Incomplete  CULTURE, BLOOD (ROUTINE X 2)     Status: None   Collection Time    01/15/13  8:32 AM      Result  Value Range Status   Specimen Description BLOOD LEFT HAND   Final   Special Requests     Final   Value: BOTTLES DRAWN AEROBIC AND ANAEROBIC 2.5 CC BOTH BOTTLES   Culture  Setup Time 01/15/2013 15:03   Final   Culture     Final   Value:        BLOOD CULTURE RECEIVED NO GROWTH TO DATE CULTURE WILL BE HELD FOR 5 DAYS BEFORE ISSUING A FINAL NEGATIVE REPORT   Report Status PENDING   Incomplete  MRSA PCR SCREENING     Status: None   Collection Time    01/15/13 11:47 AM      Result Value Range Status   MRSA by PCR NEGATIVE  NEGATIVE Final   Comment:            The GeneXpert MRSA Assay (FDA     approved for NASAL specimens     only), is one component of a     comprehensive MRSA colonization     surveillance program. It is not     intended to diagnose MRSA     infection nor to guide or     monitor treatment for     MRSA infections.  URINE CULTURE     Status: None   Collection Time    01/15/13  8:56 PM      Result Value Range Status   Specimen Description URINE, CATHETERIZED   Final    Special Requests NONE   Final   Culture  Setup Time 01/16/2013 04:19   Final   Colony Count PENDING   Incomplete   Culture Culture reincubated for better growth   Final   Report Status PENDING   Incomplete     Studies: Dg Chest Port 1 View  01/16/2013   *RADIOLOGY REPORT*  Clinical Data: Recurrent fever.  Emphysema.  High risk for aspiration.  PORTABLE CHEST - 1 VIEW  Comparison: 01/15/2013  Findings: Mild bibasilar atelectasis noted, but no evidence of pulmonary consolidation or edema.  No evidence of pleural effusion. Heart size is normal.  IMPRESSION: Persistent mild bibasilar atelectasis.   Original Report Authenticated By: Myles Rosenthal, M.D.   Dg Chest Port 1 View  01/15/2013   *RADIOLOGY REPORT*  Clinical Data: Fever.  PORTABLE CHEST - 1 VIEW  Comparison: 01/12/2013  Findings: Slightly lower lung volumes noted bilaterally with bibasilar atelectasis present.  No overt consolidation, edema or pleural fluid is identified.  There remains evidence of underlying COPD.  The heart size is within normal limits.  IMPRESSION: Low lung volumes with bibasilar atelectasis present.   Original Report Authenticated By: Irish Lack, M.D.     Additional labs:   Scheduled Meds: . brimonidine  1 drop Both Eyes BID  . budesonide-formoterol  2 puff Inhalation BID  . folic acid  1 mg Oral Daily  . multivitamin with minerals  1 tablet Oral Daily  . nicotine  21 mg Transdermal Daily  . pantoprazole (PROTONIX) IV  40 mg Intravenous Q24H  . piperacillin-tazobactam (ZOSYN)  IV  3.375 g Intravenous Q8H  . sodium chloride  3 mL Intravenous Q12H  . thiamine  100 mg Intravenous Daily  . vancomycin  750 mg Intravenous Q12H  . vitamin B-12  100 mcg Oral Daily   Continuous Infusions: . 0.9 % NaCl with KCl 20 mEq / L 100 mL/hr at 01/17/13 0355    Principal Problem:   Syncope Active Problems:   COPD (chronic obstructive pulmonary disease)  Alcohol intoxication   Lactic acidosis   Cirrhosis   History of  stroke   Leukopenia   Anemia   Orthostatic hypotension   Alcohol withdrawal   Hypokalemia   Hypomagnesemia   Fever, unspecified   Aspiration pneumonia   UTI (urinary tract infection)    Time spent: 20 minutes    [facility]  [facility] Pager [phone].   If 8PM-8AM, please contact night-coverage at [email or web address], password [facility] 01/17/2013, 8:00 AM  LOS: 5 days

## 2013-01-18 LAB — URINE CULTURE

## 2013-01-18 MED ORDER — LORAZEPAM 2 MG/ML IJ SOLN
1.0000 mg | Freq: Four times a day (QID) | INTRAMUSCULAR | Status: DC | PRN
  Administered 2013-01-19: 1 mg via INTRAVENOUS
  Filled 2013-01-18: qty 1

## 2013-01-18 NOTE — Progress Notes (Signed)
[facility] PROGRESS NOTE  Peter [name] RUE:[id number] DOB: 1950-11-05 DOA: 01/12/2013 PCP: Cameron Sprang, FNP  Brief narrative 62 year old male patient with history of alcohol dependence, cirrhosis, HTN, HL, emphysema, possible dementia who has 24/7 home care by cousin, who was brought to the [facility] ED on 01/12/13 due to multiple episodes of falls associated with syncope. He also complained of dizziness on standing. He apparently eats poorly and had periodic vomiting without hematemesis or melena. In the ED, BAL 256, venous lactate 3.9, CT head without acute findings. Approximately 2 days into admission, patient developed florid alcohol withdrawal/DTs and was transferred to step down unit for close monitoring and management on 01/15/13. Develop fever possible aspiration pneumonia +/- UTI. Patient improved and will be transferred out of step down to telemetry on 01/18/13.  Assessment/Plan: 1. Alcohol withdrawal DTs/alcohol dependence/alcohol intoxication on admission: Patient initially came in with alcohol intoxication. For the first 2 days he did not have significant withdrawal features. Since 01/14/13, she started having withdrawal features which progressively got worse. He received approximately 24 mg of Ativan over 12 hour period 7/11 night. Transferred patient to Step down unit on 01/15/13 for close monitoring and management.Changed Ativan to 2-4 mg IV every 4 hourly when necessary for CIWA greater than 8. N.p.o. until consistently alert and able to take by mouth. Patient clinically much better -alert and oriented, blood pressure and heart rate improved and CIWA better. Patient continues to make improvement. Occasional mild tremors. Has not required Ativan in the last 24 hours & CIWA between 2-4. Tolerating diet. Transferred to telemetry when bed available. 2. Hypokalemia and hypomagnesemia: Repleted. 3. Fever/PNA (Aspiration PNA > HCAP) +/- UTI: Chest x-ray unremarkable on 7/12 &  7/13-basal atelectasis. Blood cultures x2 negative to date. Urine culture pending. Continue Zosyn & Vancomycin. Monitor. Defervesced. Consider switching to oral antibiotics when final urine culture results are available. 4. Syncope/recurrent falls: Possibly from alcohol intoxication/orthostatic hypotension. 2-D echo shows mildly reduced LVEF 45-50%. Carotid Dopplers: Bilateral <39% ICA stenosis. PT and OT evaluation appreciated. CT head negative. May need outpatient cardiology evaluation for mildly reduced EF. 5. Orthostatic hypotension: Possibly from intravascular volume depletion. Discontinue IV fluids. 6. Leukopenia, microcytic anemia & Thrombocytopenia: Likely secondary to alcohol abuse & cirrhosis. Leukopenia resolved. Platelets stable. Small drop in Hb- ? Dilutional- no active bleeding. Hemoglobin stable. Thrombocytopenia resolved. 7. Emphysema: Stable.When necessary oxygen and bronchodilator nebulizations.  8. HTN:  periodic spikes in blood pressure secondary to alcohol withdrawal. Treat alcohol withdrawal. Improved. 9. Lactic acidosis: Present on admission. Improved/resolved. Unclear etiology-Likely related to alcohol.. 10. Possible dementia: Patient states that he has Alzheimer's. This could be related to alcohol abuse. 11. Alcoholic cirrhosis: Follows with Dr. Jeani Hawking   DVT prophylaxis: SCDs GI prophylaxis: PO Protonix. Nutrition: Regular diet   Code Status: Full Family Communication: Discussed with patient's caregiver, Ms. Blenda Bridegroom (CNA) at bedside.  Disposition Plan:  Transfer to telemetry when bed available. Discharge disposition based on PT and OT eval-? Home with home health services in the next 48 hours.   Consultants:  None  Procedures:  None  Antibiotics:  IV Rocephin 7/12 >7/13  IV Azithromycin 7/12 > 7/13  IV Zosyn 7/13 >  IV Vancomycin 7/13 >  HPI/Subjective: Patient denies complaints. As per nursing, continues to improve, tolerating diet, no  agitation, CIWA 2-4 has not required Ativan in the last 24 hours.  Objective: Filed Vitals:   01/18/13 0700 01/18/13 0732 01/18/13 0800 01/18/13 0802  BP:   138/74   Pulse:  84 95 105   Temp:      TempSrc:      Resp: 17  20   Height:      Weight:      SpO2: 100%  99% 97%    Intake/Output Summary (Last 24 hours) at 01/18/13 0811 Last data filed at 01/18/13 0800  Gross per 24 hour  Intake 2340.83 ml  Output   3675 ml  Net -1334.17 ml   Filed Weights   01/12/13 2046 01/16/13 0400 01/18/13 0400  Weight: 62.1 kg (136 lb 14.5 oz) 64 kg (141 lb 1.5 oz) 62.4 kg (137 lb 9.1 oz)    Exam:   General exam: Chronically ill-looking. Comfortable  Respiratory system:  Decreased breath sounds in the bases but otherwise clear to auscultation. No increased work of breathing.  Cardiovascular system: S1 & S2 heard, regular and mildly tachycardic . No JVD, murmurs, gallops, clicks or pedal edema.Telemetry: Sinus rhythm in the 90s-sinus tachycardia in the 100s.  Gastrointestinal system: Abdomen is nondistended, soft and nontender. Normal bowel sounds heard.  Central nervous system:  Alert and oriented to person, place and partly to time. No focal neurological deficits.  Extremities: Symmetric 5 x 5 power.    Data Reviewed: Basic Metabolic Panel:  Recent Labs Lab 01/12/13 1610 01/12/13 1628 01/13/13 0445 01/15/13 0537 01/16/13 0351 01/17/13 0335  NA 135 134* 137 138 136 137  K 4.2 4.3 3.7 2.9* 3.7 3.7  CL 94* 100 99 100 104 104  CO2 19  --  26 24 22 20   GLUCOSE 77 79 85 122* 94 89  BUN 6 4* 6 7 8 7   CREATININE 0.66 1.10 0.66 1.27 0.87 0.74  CALCIUM 8.8  --  9.0 9.6 9.0 8.7  MG 1.9  --   --  1.4* 2.3  --    Liver Function Tests:  Recent Labs Lab 01/12/13 1610 01/13/13 0445 01/15/13 0537 01/17/13 0335  AST 106* 81* 51* 31  ALT 49 41 29 20  ALKPHOS 100 92 89 61  BILITOT 0.3 0.7 1.0 0.5  PROT 8.0 7.4 7.5 6.7  ALBUMIN 3.8 3.6 3.6 2.7*   No results found for this  basename: LIPASE, AMYLASE,  in the last 168 hours No results found for this basename: AMMONIA,  in the last 168 hours CBC:  Recent Labs Lab 01/13/13 0445 01/14/13 0410 01/15/13 0537 01/16/13 0351 01/17/13 0335  WBC 3.0* 4.6 11.4* 10.1 5.8  NEUTROABS 1.5*  --   --   --   --   HGB 9.6* 9.2* 9.4* 8.8* 8.9*  HCT 28.0* 27.8* 27.5* 26.6* 27.0*  MCV 77.8* 79.2 80.2 80.9 80.4  PLT 180 154 134* 137* 167   Cardiac Enzymes: No results found for this basename: CKTOTAL, CKMB, CKMBINDEX, TROPONINI,  in the last 168 hours BNP (last 3 results) No results found for this basename: PROBNP,  in the last 8760 hours CBG: No results found for this basename: GLUCAP,  in the last 168 hours  Recent Results (from the past 240 hour(s))  CULTURE, BLOOD (ROUTINE X 2)     Status: None   Collection Time    01/15/13  8:27 AM      Result Value Range Status   Specimen Description BLOOD LEFT ARM   Final   Special Requests     Final   Value: BOTTLES DRAWN AEROBIC AND ANAEROBIC 3.5CC BOTH BOTTLES   Culture  Setup Time 01/15/2013 15:03   Final   Culture  Final   Value:        BLOOD CULTURE RECEIVED NO GROWTH TO DATE CULTURE WILL BE HELD FOR 5 DAYS BEFORE ISSUING A FINAL NEGATIVE REPORT   Report Status PENDING   Incomplete  CULTURE, BLOOD (ROUTINE X 2)     Status: None   Collection Time    01/15/13  8:32 AM      Result Value Range Status   Specimen Description BLOOD LEFT HAND   Final   Special Requests     Final   Value: BOTTLES DRAWN AEROBIC AND ANAEROBIC 2.5 CC BOTH BOTTLES   Culture  Setup Time 01/15/2013 15:03   Final   Culture     Final   Value:        BLOOD CULTURE RECEIVED NO GROWTH TO DATE CULTURE WILL BE HELD FOR 5 DAYS BEFORE ISSUING A FINAL NEGATIVE REPORT   Report Status PENDING   Incomplete  MRSA PCR SCREENING     Status: None   Collection Time    01/15/13 11:47 AM      Result Value Range Status   MRSA by PCR NEGATIVE  NEGATIVE Final   Comment:            The GeneXpert MRSA Assay (FDA      approved for NASAL specimens     only), is one component of a     comprehensive MRSA colonization     surveillance program. It is not     intended to diagnose MRSA     infection nor to guide or     monitor treatment for     MRSA infections.  URINE CULTURE     Status: None   Collection Time    01/15/13  8:56 PM      Result Value Range Status   Specimen Description URINE, CATHETERIZED   Final   Special Requests NONE   Final   Culture  Setup Time 01/16/2013 04:19   Final   Colony Count PENDING   Incomplete   Culture Culture reincubated for better growth   Final   Report Status PENDING   Incomplete     Studies: Dg Chest Port 1 View  01/16/2013   *RADIOLOGY REPORT*  Clinical Data: Recurrent fever.  Emphysema.  High risk for aspiration.  PORTABLE CHEST - 1 VIEW  Comparison: 01/15/2013  Findings: Mild bibasilar atelectasis noted, but no evidence of pulmonary consolidation or edema.  No evidence of pleural effusion. Heart size is normal.  IMPRESSION: Persistent mild bibasilar atelectasis.   Original Report Authenticated By: Myles Rosenthal, M.D.     Additional labs:   Scheduled Meds: . brimonidine  1 drop Both Eyes BID  . budesonide-formoterol  2 puff Inhalation BID  . folic acid  1 mg Oral Daily  . multivitamin with minerals  1 tablet Oral Daily  . nicotine  21 mg Transdermal Daily  . pantoprazole  40 mg Oral Daily  . piperacillin-tazobactam (ZOSYN)  IV  3.375 g Intravenous Q8H  . sodium chloride  3 mL Intravenous Q12H  . thiamine  100 mg Oral Daily  . vancomycin  750 mg Intravenous Q12H  . vitamin B-12  100 mcg Oral Daily   Continuous Infusions: . 0.9 % NaCl with KCl 20 mEq / L 50 mL/hr at 01/17/13 2107    Principal Problem:   Syncope Active Problems:   COPD (chronic obstructive pulmonary disease)   Alcohol intoxication   Lactic acidosis   Cirrhosis   History of stroke  Leukopenia   Anemia   Orthostatic hypotension   Alcohol withdrawal   Hypokalemia    Hypomagnesemia   Fever, unspecified   Aspiration pneumonia   UTI (urinary tract infection)    Time spent: 20 minutes    [facility]  [facility] Pager [phone].   If 8PM-8AM, please contact night-coverage at [email or web address], password [facility] 01/18/2013, 8:11 AM  LOS: 6 days

## 2013-01-18 NOTE — Progress Notes (Signed)
Physical Therapy Treatment Patient Details Name: Peter [name] MRN: [id number] DOB: 04-27-1951 Today's Date: 01/18/2013 Time: 4098-1191 PT Time Calculation (min): 25 min  PT Assessment / Plan / Recommendation  PT Comments   Pt feeling much better. Assisted OOB to amb in hallway with no c/o's.  Pt plans to D/C to home with family support and he already has a private home health Aide "Bonita Quin".   Follow Up Recommendations  Home health PT;Supervision/Assistance - 24 hour     Does the patient have the potential to tolerate intense rehabilitation     Barriers to Discharge        Equipment Recommendations  None recommended by PT  Already has a cane   Recommendations for Other Services    Frequency Min 3X/week   Progress towards PT Goals Progress towards PT goals: Progressing toward goals  Plan      Precautions / Restrictions Restrictions Weight Bearing Restrictions: No    Pertinent Vitals/Pain No c/o pain    Mobility  Bed Mobility Bed Mobility: Supine to Sit Supine to Sit: 5: Supervision Details for Bed Mobility Assistance: increased time Transfers Transfers: Sit to Stand;Stand to Sit Sit to Stand: 4: Min guard;From bed Stand to Sit: 4: Min guard;To bed Details for Transfer Assistance: good use of hands to steady self Ambulation/Gait Ambulation/Gait Assistance: 4: Min assist Ambulation Distance (Feet): 280 Feet Assistive device: Rolling walker Ambulation/Gait Assistance Details: More steady gait this session with no tremors as noted prior.  RA sats 98% and HR 130's during gait.  Pt states he feels "good". Gait Pattern: Step-through pattern Gait velocity: WFL    PT Goals (current goals can now be found in the care plan section)    Visit Information  Last PT Received On: 01/18/13 Assistance Needed: +1 (+ 1.5 equipment) History of Present Illness: [name] is a 62 y.o. male alcoholic who was brought to the emergency room by his caregiver with multiple episodes of  near syncope. He reports that when he stands up, he feels weak and nearly loses consciousness.  Pt blood alcohol level on adm was 256    Subjective Data      Cognition       Balance     End of Session PT - End of Session Equipment Utilized During Treatment: Gait belt Activity Tolerance: Patient tolerated treatment well Patient left: in bed;with call bell/phone within reach   [name]  PTA [place]  Acute  Rehab Pager      [phone]

## 2013-01-19 MED ORDER — LEVOFLOXACIN 750 MG PO TABS: 750.0000 mg | ORAL_TABLET | Freq: Every day | ORAL | Status: DC

## 2013-01-19 MED ORDER — PANTOPRAZOLE SODIUM 40 MG PO TBEC: 40.0000 mg | DELAYED_RELEASE_TABLET | Freq: Every day | ORAL | Status: DC

## 2013-01-19 MED ORDER — LEVOFLOXACIN IN D5W 750 MG/150ML IV SOLN
750.0000 mg | INTRAVENOUS | Status: DC
  Filled 2013-01-19: qty 150

## 2013-01-19 NOTE — Progress Notes (Signed)
Spoke with pt's mother at bedside and pt concerning Home Health needs. They selected [facility] for HHPT. Referral given to in house rep with [facility].

## 2013-01-19 NOTE — Discharge Summary (Signed)
Physician Discharge Summary  Peter [name] ZOX:[id number] DOB: 1951/03/05 DOA: 01/12/2013  PCP: Cameron Sprang, FNP  Admit date: 01/12/2013 Discharge date: 01/19/2013  Time spent: 50 minutes  Recommendations for Outpatient Follow-up:  Follow up PCP in 2 weeks  Discharge Diagnoses:  Principal Problem:   Syncope Active Problems:   COPD (chronic obstructive pulmonary disease)   Alcohol intoxication   Lactic acidosis   Cirrhosis   History of stroke   Leukopenia   Anemia   Orthostatic hypotension   Alcohol withdrawal   Hypokalemia   Hypomagnesemia   Fever, unspecified   Aspiration pneumonia   UTI (urinary tract infection)   Discharge Condition: Stable  Diet recommendation: Low salt diet  Filed Weights   01/12/13 2046 01/16/13 0400 01/18/13 0400  Weight: 62.1 kg (136 lb 14.5 oz) 64 kg (141 lb 1.5 oz) 62.4 kg (137 lb 9.1 oz)    History of present illness:  62 year old male patient with history of alcohol dependence, cirrhosis, HTN, HL, emphysema, possible dementia who has 24/7 home care by cousin, who was brought to the [facility] ED on 01/12/13 due to multiple episodes of falls associated with syncope. He also complained of dizziness on standing. He apparently eats poorly and had periodic vomiting without hematemesis or melena. In the ED, BAL 256, venous lactate 3.9, CT head without acute findings. Approximately 2 days into admission, patient developed florid alcohol withdrawal/DTs and was transferred to step down unit for close monitoring and management on 01/15/13. Develop fever possible aspiration pneumonia +/- UTI. Patient improved and will be transferred out of step down to telemetry on 01/18/13   Hospital Course:  1. Alcohol withdrawal DTs/alcohol dependence/alcohol intoxication on admission: Patient initially came in with alcohol intoxication. For the first 2 days he did not have significant withdrawal features. Since 01/14/13, she started having withdrawal features  which progressively got worse. He received approximately 24 mg of Ativan over 12 hour period 7/11 night. Transferred patient to Step down unit on 01/15/13 for close monitoring and management.Changed Ativan to 2-4 mg IV every 4 hourly when necessary for CIWA greater than 8. N.p.o. until consistently alert and able to take by mouth. Patient clinically much better -alert and oriented, blood pressure and heart rate improved and CIWA better. Patient continues to make improvement. He is not having any tremors at this time. 2. Hypokalemia and hypomagnesemia: Repleted. 3. Fever/PNA (Aspiration PNA > HCAP) +/- UTI: Chest x-ray unremarkable on 7/12 & 7/13-basal atelectasis. Blood cultures x2 negative to date. Urine culture growing Klebsiella and Enterococcus,  Will change the antibiotic to levaquin at home for seven more days. 4. Syncope/recurrent falls: Possibly from alcohol intoxication/orthostatic hypotension. 2-D echo shows mildly reduced LVEF 45-50%. Carotid Dopplers: Bilateral <39% ICA stenosis. PT and OT evaluation appreciated. CT head negative. May need outpatient cardiology evaluation for mildly reduced EF. 5. Orthostatic hypotension- Resolved, due to intravascular depletion. 6. Leukopenia, microcytic anemia & Thrombocytopenia: Likely secondary to alcohol abuse & cirrhosis. Leukopenia resolved. Platelets stable. Small drop in Hb- ? Dilutional- no active bleeding. Hemoglobin stable. Thrombocytopenia resolved. 7. Emphysema: Stable.When necessary oxygen and bronchodilator nebulizations.  8. HTN: periodic spikes in blood pressure secondary to alcohol withdrawal. Treat alcohol withdrawal. Improved. 9. Lactic acidosis: Present on admission. Improved/resolved. Unclear etiology-Likely related to alcohol.. 10. Possible dementia: Patient states that he has Alzheimer's. This could be related to alcohol abuse. 11. Alcoholic cirrhosis: Follows with Dr. Jeani Hawking 12. Deconditioning- Home health PT       Procedures:  None  Consultations:  None  Discharge Exam: Filed Vitals:   01/18/13 2129 01/18/13 2159 01/19/13 0604 01/19/13 0902  BP: 142/89  151/89   Pulse: 98  91   Temp: 98.3 F (36.8 C)  99.2 F (37.3 C)   TempSrc: Oral  Oral   Resp: 18  20   Height:      Weight:      SpO2: 100% 97% 99% 99%    General: Appear in no acute distress Cardiovascular: S1s2 RRR Respiratory: Clear bilaterally  Discharge Instructions  Discharge Orders   Future Orders Complete By Expires     Diet - low sodium heart healthy  As directed     Increase activity slowly  As directed         Medication List         acamprosate 333 MG tablet  Commonly known as:  CAMPRAL  Take 2 tablets (666 mg total) by mouth 3 (three) times daily.     albuterol 108 (90 BASE) MCG/ACT inhaler  Commonly known as:  PROVENTIL HFA;VENTOLIN HFA  Inhale 2 puffs into the lungs every 6 (six) hours as needed. Shortness of breath     atorvastatin 80 MG tablet  Commonly known as:  LIPITOR  Take 80 mg by mouth daily.     brimonidine 0.2 % ophthalmic solution  Commonly known as:  ALPHAGAN  Place 1 drop into both eyes 2 (two) times daily.     budesonide-formoterol 80-4.5 MCG/ACT inhaler  Commonly known as:  SYMBICORT  Inhale 2 puffs into the lungs 2 (two) times daily. For COPD.     ferrous sulfate 325 (65 FE) MG tablet  Take 325 mg by mouth daily with breakfast. For low iron.     folic acid 1 MG tablet  Commonly known as:  FOLVITE  Take 1 mg by mouth daily. For low folate.     levofloxacin 750 MG tablet  Commonly known as:  LEVAQUIN  Take 1 tablet (750 mg total) by mouth daily.     LORazepam 1 MG tablet  Commonly known as:  ATIVAN  Take 1 tablet (1 mg total) by mouth every 8 (eight) hours as needed for anxiety.     multivitamin tablet  Take 1 tablet by mouth daily.     nicotine 7 mg/24hr patch  Commonly known as:  NICODERM CQ - dosed in mg/24 hr  Place 1 patch onto the skin daily.      pantoprazole 40 MG tablet  Commonly known as:  PROTONIX  Take 1 tablet (40 mg total) by mouth daily.     thiamine 100 MG tablet  Commonly known as:  VITAMIN B-1  Take 100 mg by mouth daily. For vitamin B deficiency.     traMADol 50 MG tablet  Commonly known as:  ULTRAM  Take 50 mg by mouth 2 (two) times daily as needed. For pain     traZODone 50 MG tablet  Commonly known as:  DESYREL  Take 1 tablet (50 mg total) by mouth at bedtime.     vitamin B-12 100 MCG tablet  Commonly known as:  CYANOCOBALAMIN  Take 100 mcg by mouth daily. For low B12.     vitamin E 200 UNIT capsule  Take 1 capsule (200 Units total) by mouth daily.       Allergies  Allergen Reactions  . Poison Ivy Extract (Extract Of Poison Ivy) Rash      The results of significant diagnostics from this hospitalization (including imaging, microbiology, ancillary and  laboratory) are listed below for reference.    Significant Diagnostic Studies: Dg Chest 2 View  01/12/2013   *RADIOLOGY REPORT*  Clinical Data: Syncope.  History of emphysema.  CHEST - 2 VIEW  Comparison: PA and lateral chest 05/18/2012 and 02/12/2011.  Findings: The chest is hyperexpanded with attenuation of the pulmonary vasculature but the lungs are clear.  Heart size is normal.  No pneumothorax or pleural fluid.  Remote left clavicle fractures noted.  IMPRESSION: Emphysema without acute disease.   Original Report Authenticated By: Holley Dexter, M.D.   Ct Head Wo Contrast  01/12/2013   *RADIOLOGY REPORT*  Clinical Data: Syncope and weakness.  CT HEAD WITHOUT CONTRAST  Technique:  Contiguous axial images were obtained from the base of the skull through the vertex without contrast.  Comparison: CT head without contrast 01/03/2012.  Findings: Mild generalized atrophy is similar to the prior study. No acute cortical infarct, hemorrhage or mass lesion is present. The ventricles are proportionate to the degree of atrophy.  No significant white matter disease is  present.  No significant extra- axial fluid collection is evident.  A fluid level is present in the right maxillary sinus.  The remaining paranasal sinuses and mastoid air cells are clear.  The osseous skull is intact.  IMPRESSION:  1.  Moderate generalized atrophy is advanced for age.  This could be related to alcohol abuse. 2.  No acute intracranial abnormality. 3.  Right maxillary sinusitis.   Original Report Authenticated By: Marin Roberts, M.D.   Dg Chest Port 1 View  01/16/2013   *RADIOLOGY REPORT*  Clinical Data: Recurrent fever.  Emphysema.  High risk for aspiration.  PORTABLE CHEST - 1 VIEW  Comparison: 01/15/2013  Findings: Mild bibasilar atelectasis noted, but no evidence of pulmonary consolidation or edema.  No evidence of pleural effusion. Heart size is normal.  IMPRESSION: Persistent mild bibasilar atelectasis.   Original Report Authenticated By: Myles Rosenthal, M.D.   Dg Chest Port 1 View  01/15/2013   *RADIOLOGY REPORT*  Clinical Data: Fever.  PORTABLE CHEST - 1 VIEW  Comparison: 01/12/2013  Findings: Slightly lower lung volumes noted bilaterally with bibasilar atelectasis present.  No overt consolidation, edema or pleural fluid is identified.  There remains evidence of underlying COPD.  The heart size is within normal limits.  IMPRESSION: Low lung volumes with bibasilar atelectasis present.   Original Report Authenticated By: Irish Lack, M.D.    Microbiology: Recent Results (from the past 240 hour(s))  CULTURE, BLOOD (ROUTINE X 2)     Status: None   Collection Time    01/15/13  8:27 AM      Result Value Range Status   Specimen Description BLOOD LEFT ARM   Final   Special Requests     Final   Value: BOTTLES DRAWN AEROBIC AND ANAEROBIC 3.5CC BOTH BOTTLES   Culture  Setup Time 01/15/2013 15:03   Final   Culture     Final   Value:        BLOOD CULTURE RECEIVED NO GROWTH TO DATE CULTURE WILL BE HELD FOR 5 DAYS BEFORE ISSUING A FINAL NEGATIVE REPORT   Report Status PENDING    Incomplete  CULTURE, BLOOD (ROUTINE X 2)     Status: None   Collection Time    01/15/13  8:32 AM      Result Value Range Status   Specimen Description BLOOD LEFT HAND   Final   Special Requests     Final   Value: BOTTLES DRAWN AEROBIC  AND ANAEROBIC 2.5 CC BOTH BOTTLES   Culture  Setup Time 01/15/2013 15:03   Final   Culture     Final   Value:        BLOOD CULTURE RECEIVED NO GROWTH TO DATE CULTURE WILL BE HELD FOR 5 DAYS BEFORE ISSUING A FINAL NEGATIVE REPORT   Report Status PENDING   Incomplete  MRSA PCR SCREENING     Status: None   Collection Time    01/15/13 11:47 AM      Result Value Range Status   MRSA by PCR NEGATIVE  NEGATIVE Final   Comment:            The GeneXpert MRSA Assay (FDA     approved for NASAL specimens     only), is one component of a     comprehensive MRSA colonization     surveillance program. It is not     intended to diagnose MRSA     infection nor to guide or     monitor treatment for     MRSA infections.  URINE CULTURE     Status: None   Collection Time    01/15/13  8:56 PM      Result Value Range Status   Specimen Description URINE, CATHETERIZED   Final   Special Requests NONE   Final   Culture  Setup Time 01/16/2013 04:19   Final   Colony Count >=100,000 COLONIES/ML   Final   Culture     Final   Value: KLEBSIELLA PNEUMONIAE     ENTEROCOCCUS SPECIES   Report Status 01/18/2013 FINAL   Final   Organism ID, Bacteria KLEBSIELLA PNEUMONIAE   Final   Organism ID, Bacteria ENTEROCOCCUS SPECIES   Final     Labs: Basic Metabolic Panel:  Recent Labs Lab 01/12/13 1610 01/12/13 1628 01/13/13 0445 01/15/13 0537 01/16/13 0351 01/17/13 0335  NA 135 134* 137 138 136 137  K 4.2 4.3 3.7 2.9* 3.7 3.7  CL 94* 100 99 100 104 104  CO2 19  --  26 24 22 20   GLUCOSE 77 79 85 122* 94 89  BUN 6 4* 6 7 8 7   CREATININE 0.66 1.10 0.66 1.27 0.87 0.74  CALCIUM 8.8  --  9.0 9.6 9.0 8.7  MG 1.9  --   --  1.4* 2.3  --    Liver Function Tests:  Recent  Labs Lab 01/12/13 1610 01/13/13 0445 01/15/13 0537 01/17/13 0335  AST 106* 81* 51* 31  ALT 49 41 29 20  ALKPHOS 100 92 89 61  BILITOT 0.3 0.7 1.0 0.5  PROT 8.0 7.4 7.5 6.7  ALBUMIN 3.8 3.6 3.6 2.7*   No results found for this basename: LIPASE, AMYLASE,  in the last 168 hours No results found for this basename: AMMONIA,  in the last 168 hours CBC:  Recent Labs Lab 01/13/13 0445 01/14/13 0410 01/15/13 0537 01/16/13 0351 01/17/13 0335  WBC 3.0* 4.6 11.4* 10.1 5.8  NEUTROABS 1.5*  --   --   --   --   HGB 9.6* 9.2* 9.4* 8.8* 8.9*  HCT 28.0* 27.8* 27.5* 26.6* 27.0*  MCV 77.8* 79.2 80.2 80.9 80.4  PLT 180 154 134* 137* 167   Cardiac Enzymes: No results found for this basename: CKTOTAL, CKMB, CKMBINDEX, TROPONINI,  in the last 168 hours BNP: BNP (last 3 results) No results found for this basename: PROBNP,  in the last 8760 hours CBG: No results found for this basename: GLUCAP,  in  the last 168 hours     Signed:  LAMA,GAGAN S  [facility] 01/19/2013, 12:41 PM

## 2013-01-19 NOTE — Progress Notes (Signed)
ANTIBIOTIC CONSULT NOTE - Follow up  Pharmacy Consult for Vancomycin and Zosyn Indication: Rule out HCAP, Aspiration, UTI  Allergies  Allergen Reactions  . Poison Ivy Extract (Extract Of Poison Ivy) Rash    Patient Measurements: Height: 5\' 7"  (170.2 cm) Weight: 137 lb 9.1 oz (62.4 kg) IBW/kg (Calculated) : 66.1  Vital Signs: Temp: 99.2 F (37.3 C) (07/16 0604) Temp src: Oral (07/16 0604) BP: 151/89 mmHg (07/16 0604) Pulse Rate: 91 (07/16 0604)  Labs:  Recent Labs  01/17/13 0335  WBC 5.8  HGB 8.9*  PLT 167  CREATININE 0.74   Estimated Creatinine Clearance: 87.8 ml/min (by C-G formula based on Cr of 0.74).      Microbiology: 7/12 blood x2: NGTD 7/12 urine: KLEBSIELLA PNEUMONIAE (sens: cefaz, ceftriax, cipro, gent, levo, P/t, bactrim)                   ENTEROCOCCUS (sens: amp, levo, NTF, vanc)    Assessment: 62 year old male patient with history of alcohol dependence, cirrhosis, HTN, HL, emphysema, possible dementia, admitted on 01/12/13 due to multiple episodes of falls associated with syncope. Developed DTs and tx step down 7/12 for closer monitoring. Starting abx 7/13 for possible aspiration, HCAP, UTI.  Day #4 Vancomycin and Zosyn  SCr wnl 0.74, CrCl ~88 ml/min  Urine cultures as above - currently covering both Klebsiella and Enterococcus with Vanc and Zosyn.  Levaquin as a single agent will also cover both pathogens.   Goal of Therapy:  Vancomycin trough level 15-20 mcg/ml  Plan:   Continue Vancomycin 750mg  IV q12h Zosyn 3.375gm IV q8h (4hr extended infusions) Follow up renal function & cultures, de-escalation of therapy   Lynann Beaver PharmD, BCPS Pager [phone] 01/19/2013 11:45 AM

## 2013-01-21 LAB — CULTURE, BLOOD (ROUTINE X 2)
Culture: NO GROWTH
Culture: NO GROWTH

## 2013-02-03 ENCOUNTER — Ambulatory Visit: Payer: Self-pay | Admitting: Family Medicine

## 2013-02-14 ENCOUNTER — Encounter (HOSPITAL_COMMUNITY): Payer: Self-pay | Admitting: Emergency Medicine

## 2013-02-14 ENCOUNTER — Emergency Department (INDEPENDENT_AMBULATORY_CARE_PROVIDER_SITE_OTHER)
Admission: EM | Admit: 2013-02-14 | Discharge: 2013-02-14 | Disposition: A | Discharge status: Home / Self Care | Payer: Medicare Other | Source: Home / Self Care

## 2013-02-14 DIAGNOSIS — S335XXA Sprain of ligaments of lumbar spine, initial encounter: Secondary | ICD-10-CM

## 2013-02-14 DIAGNOSIS — S39012A Strain of muscle, fascia and tendon of lower back, initial encounter: Secondary | ICD-10-CM

## 2013-02-14 LAB — POCT URINALYSIS DIP (DEVICE)
Ketones, ur: NEGATIVE mg/dL
Leukocytes, UA: NEGATIVE
Nitrite: NEGATIVE
Protein, ur: 30 mg/dL — AB
Urobilinogen, UA: 0.2 mg/dL (ref 0.0–1.0)

## 2013-02-14 MED ORDER — DICLOFENAC POTASSIUM 50 MG PO TABS: 50.0000 mg | ORAL_TABLET | Freq: Three times a day (TID) | ORAL | Status: DC

## 2013-02-14 MED ORDER — CYCLOBENZAPRINE HCL 5 MG PO TABS: 5.0000 mg | ORAL_TABLET | Freq: Three times a day (TID) | ORAL | Status: DC | PRN

## 2013-02-14 NOTE — ED Provider Notes (Signed)
CSN: [id number]     Arrival date & time 02/14/13  1312 History     None    Chief Complaint  Patient presents with  . Back Pain    right sided back pain with urinary frequency.  Need refill on Medication  . Cough    productive cough with yellow and gray sputum. recently hospitalized for pneumonia on 7/9   (Consider location/radiation/quality/duration/timing/severity/associated sxs/prior Treatment) Patient is a 62 y.o. male presenting with back pain and cough. The history is provided by the patient and a caregiver.  Back Pain Location:  Lumbar spine Quality:  Aching Radiates to:  Does not radiate Pain severity:  Mild Onset quality:  Sudden Progression:  Unchanged Chronicity:  New Context: emotional stress   Context: not lifting heavy objects and not recent injury   Context comment:  Onset getting out of bed, NKI Cough Associated symptoms: no myalgias     Past Medical History  Diagnosis Date  . Hypertension   . Hyperlipemia   . Emphysema   . Stroke   . Chronic pain   . Cirrhosis of liver   . Fall   . Dental injury   . Bone spur of other site     Left Foot  . Impaired mobility   . Total self care deficit   . Pneumonia   . Emphysema   . ETOH abuse   . Elevated LFTs   . Arthritis   . Anxiety   . Sinusitis   . Anemia     iron def.  . Hard of hearing    Past Surgical History  Procedure Laterality Date  . No past surgeries    . Colonoscopy  01/06/2012    Procedure: COLONOSCOPY;  Surgeon: Theda Belfast, MD;  Location: [facility] ENDOSCOPY;  Service: Endoscopy;  Laterality: N/A;  . Esophagogastroduodenoscopy  01/06/2012    Procedure: ESOPHAGOGASTRODUODENOSCOPY (EGD);  Surgeon: Theda Belfast, MD;  Location: [facility] ENDOSCOPY;  Service: Endoscopy;  Laterality: N/A;  . Esophagogastroduodenoscopy  01/09/2012    Procedure: ESOPHAGOGASTRODUODENOSCOPY (EGD);  Surgeon: Theda Belfast, MD;  Location: [facility] ENDOSCOPY;  Service: Endoscopy;  Laterality: N/A;  . Multiple extractions with  alveoloplasty  03/29/2012    Procedure: MULTIPLE EXTRACION WITH ALVEOLOPLASTY;  Surgeon: Georgia Lopes, DDS;  Location: [facility] OR;  Service: Oral Surgery;  Laterality: Bilateral;  . Bone spur  2013    left spur   History reviewed. No pertinent family history. History  Substance Use Topics  . Smoking status: Current Every Day Smoker -- 2.00 packs/day for 30 years    Types: Cigarettes  . Smokeless tobacco: Never Used  . Alcohol Use: 2.4 oz/week    4 Cans of beer per week     Comment: per pt four  40oz daily beer    Review of Systems  Constitutional: Negative.   Respiratory: Positive for cough.   Gastrointestinal: Negative.   Genitourinary: Negative.   Musculoskeletal: Positive for back pain. Negative for myalgias, joint swelling and gait problem.  Skin: Negative.     Allergies  Poison ivy extract  Home Medications   Current Outpatient Rx  Name  Route  Sig  Dispense  Refill  . acamprosate (CAMPRAL) 333 MG tablet   Oral   Take 2 tablets (666 mg total) by mouth 3 (three) times daily.   6 tablet   0   . atorvastatin (LIPITOR) 80 MG tablet   Oral   Take 80 mg by mouth daily.         Marland Kitchen  brimonidine (ALPHAGAN) 0.2 % ophthalmic solution   Both Eyes   Place 1 drop into both eyes 2 (two) times daily.         . ferrous sulfate 325 (65 FE) MG tablet   Oral   Take 325 mg by mouth daily with breakfast. For low iron.         . folic acid (FOLVITE) 1 MG tablet   Oral   Take 1 mg by mouth daily. For low folate.         Marland Kitchen levofloxacin (LEVAQUIN) 750 MG tablet   Oral   Take 1 tablet (750 mg total) by mouth daily.   7 tablet   0   . LORazepam (ATIVAN) 1 MG tablet   Oral   Take 1 tablet (1 mg total) by mouth every 8 (eight) hours as needed for anxiety.   10 tablet   0   . Multiple Vitamin (MULTIVITAMIN) tablet   Oral   Take 1 tablet by mouth daily.   30 tablet   0   . nicotine (NICODERM CQ - DOSED IN MG/24 HR) 7 mg/24hr patch   Transdermal   Place 1 patch onto  the skin daily.   28 patch   0   . pantoprazole (PROTONIX) 40 MG tablet   Oral   Take 1 tablet (40 mg total) by mouth daily.   30 tablet   0   . thiamine (VITAMIN B-1) 100 MG tablet   Oral   Take 100 mg by mouth daily. For vitamin B deficiency.         . traZODone (DESYREL) 50 MG tablet   Oral   Take 1 tablet (50 mg total) by mouth at bedtime.   30 tablet   0   . vitamin E 200 UNIT capsule   Oral   Take 1 capsule (200 Units total) by mouth daily.   7 capsule   0   . albuterol (PROVENTIL HFA;VENTOLIN HFA) 108 (90 BASE) MCG/ACT inhaler   Inhalation   Inhale 2 puffs into the lungs every 6 (six) hours as needed. Shortness of breath         . EXPIRED: budesonide-formoterol (SYMBICORT) 80-4.5 MCG/ACT inhaler   Inhalation   Inhale 2 puffs into the lungs 2 (two) times daily. For COPD.         . cyclobenzaprine (FLEXERIL) 5 MG tablet   Oral   Take 1 tablet (5 mg total) by mouth 3 (three) times daily as needed for muscle spasms.   30 tablet   0   . diclofenac (CATAFLAM) 50 MG tablet   Oral   Take 1 tablet (50 mg total) by mouth 3 (three) times daily. Prn back pain.   15 tablet   0   . traMADol (ULTRAM) 50 MG tablet   Oral   Take 50 mg by mouth 2 (two) times daily as needed. For pain         . vitamin B-12 (CYANOCOBALAMIN) 100 MCG tablet   Oral   Take 100 mcg by mouth daily. For low B12.          BP 154/87  Pulse 108  Temp(Src) 99.7 F (37.6 C) (Oral)  Resp 20  SpO2 100% Physical Exam  Nursing note and vitals reviewed. Constitutional: He is oriented to person, place, and time. He appears well-developed and well-nourished.  Pulmonary/Chest: Breath sounds normal.  Abdominal: Soft. Bowel sounds are normal. He exhibits no mass. There is no tenderness. There is no  rebound and no guarding.  Musculoskeletal: He exhibits tenderness.       Lumbar back: He exhibits tenderness, pain and spasm. He exhibits no swelling and no deformity.        Back:  Neurological: He is alert and oriented to person, place, and time.  Skin: Skin is warm and dry.    ED Course   Procedures (including critical care time)  Labs Reviewed  POCT URINALYSIS DIP (DEVICE) - Abnormal; Notable for the following:    Hgb urine dipstick TRACE (*)    Protein, ur 30 (*)    All other components within normal limits   No results found. 1. Strain of lumbar paraspinal muscle, initial encounter     MDM    Linna Hoff, MD 02/14/13 1433

## 2013-02-14 NOTE — ED Notes (Signed)
Pt reports productive cough with yellow/gray sputum with sob. Pt was recently hospitalized for pneumonia on 7/9.  Reports right sided lower back pain with urinary frequency and mild nausea. Denies fever, vomiting and diarrhea.   Pt is in need of medication refill. Pt was sent over from adult clinic.

## 2013-02-16 ENCOUNTER — Telehealth: Payer: Self-pay | Admitting: Internal Medicine

## 2013-02-16 ENCOUNTER — Ambulatory Visit: Payer: Medicaid Other | Attending: Family Medicine | Admitting: Internal Medicine

## 2013-02-16 VITALS — BP 111/78 | HR 111 | Temp 98.5°F | Resp 16 | Ht 67.0 in | Wt 140.0 lb

## 2013-02-16 DIAGNOSIS — Z8673 Personal history of transient ischemic attack (TIA), and cerebral infarction without residual deficits: Secondary | ICD-10-CM | POA: Diagnosis not present

## 2013-02-16 DIAGNOSIS — I10 Essential (primary) hypertension: Secondary | ICD-10-CM | POA: Insufficient documentation

## 2013-02-16 DIAGNOSIS — G8929 Other chronic pain: Secondary | ICD-10-CM | POA: Diagnosis not present

## 2013-02-16 DIAGNOSIS — F329 Major depressive disorder, single episode, unspecified: Secondary | ICD-10-CM

## 2013-02-16 DIAGNOSIS — F3289 Other specified depressive episodes: Secondary | ICD-10-CM | POA: Diagnosis not present

## 2013-02-16 DIAGNOSIS — K7460 Unspecified cirrhosis of liver: Secondary | ICD-10-CM | POA: Diagnosis not present

## 2013-02-16 DIAGNOSIS — E785 Hyperlipidemia, unspecified: Secondary | ICD-10-CM | POA: Insufficient documentation

## 2013-02-16 DIAGNOSIS — F1010 Alcohol abuse, uncomplicated: Secondary | ICD-10-CM | POA: Diagnosis not present

## 2013-02-16 DIAGNOSIS — J438 Other emphysema: Secondary | ICD-10-CM | POA: Diagnosis not present

## 2013-02-16 DIAGNOSIS — J4489 Other specified chronic obstructive pulmonary disease: Secondary | ICD-10-CM | POA: Diagnosis not present

## 2013-02-16 DIAGNOSIS — K7030 Alcoholic cirrhosis of liver without ascites: Secondary | ICD-10-CM

## 2013-02-16 DIAGNOSIS — J449 Chronic obstructive pulmonary disease, unspecified: Secondary | ICD-10-CM | POA: Insufficient documentation

## 2013-02-16 DIAGNOSIS — F32A Depression, unspecified: Secondary | ICD-10-CM | POA: Insufficient documentation

## 2013-02-16 MED ORDER — TRAZODONE HCL 50 MG PO TABS: 50.0000 mg | ORAL_TABLET | Freq: Every day | ORAL | Status: DC

## 2013-02-16 MED ORDER — VITAMIN B-12 100 MCG PO TABS: 100.0000 ug | ORAL_TABLET | Freq: Every day | ORAL | Status: DC

## 2013-02-16 MED ORDER — PANTOPRAZOLE SODIUM 40 MG PO TBEC: 40.0000 mg | DELAYED_RELEASE_TABLET | Freq: Every day | ORAL | Status: DC

## 2013-02-16 MED ORDER — ATORVASTATIN CALCIUM 80 MG PO TABS: 80.0000 mg | ORAL_TABLET | Freq: Every day | ORAL | Status: DC

## 2013-02-16 MED ORDER — ACAMPROSATE CALCIUM 333 MG PO TBEC: 666.0000 mg | DELAYED_RELEASE_TABLET | Freq: Three times a day (TID) | ORAL | Status: DC

## 2013-02-16 MED ORDER — FOLIC ACID 1 MG PO TABS: 1.0000 mg | ORAL_TABLET | Freq: Every day | ORAL | Status: DC

## 2013-02-16 MED ORDER — CYCLOBENZAPRINE HCL 5 MG PO TABS: 5.0000 mg | ORAL_TABLET | Freq: Three times a day (TID) | ORAL | Status: DC | PRN

## 2013-02-16 MED ORDER — VITAMIN E 200 UNITS PO CAPS: 200.0000 [IU] | ORAL_CAPSULE | Freq: Every day | ORAL | Status: DC

## 2013-02-16 MED ORDER — FERROUS SULFATE 325 (65 FE) MG PO TABS: 325.0000 mg | ORAL_TABLET | Freq: Every day | ORAL | Status: DC

## 2013-02-16 MED ORDER — ONE-DAILY MULTI VITAMINS PO TABS: 1.0000 | ORAL_TABLET | Freq: Every day | ORAL | Status: DC

## 2013-02-16 MED ORDER — VITAMIN B-1 100 MG PO TABS: 100.0000 mg | ORAL_TABLET | Freq: Every day | ORAL | Status: DC

## 2013-02-16 NOTE — Telephone Encounter (Signed)
Pt uses [facility] (home delivery services) for medications, Fax # is [phone].

## 2013-02-16 NOTE — Progress Notes (Signed)
Pt here to establish care Poor historian-memory loss.caregiver with pt Ran out of meds Multiple medical problems Chronic back pain Need Rx's sent to [facility]- they deliver pt meds [phone] or fax # [phone] Cane assist/lives alone

## 2013-02-24 ENCOUNTER — Encounter: Payer: Self-pay | Admitting: Gastroenterology

## 2013-03-04 NOTE — Progress Notes (Signed)
Patient ID: Peter [name], male   DOB: [birth year], 62 y.o.   MRN: [id number]  CC: To establish care This note is for encounter date 02/16/2013  HPI: Patient is a 62 years old man who came to clinic today to establish medical care. Has extensive history for hypertension, hyperlipidemia, emphysema, previous CVA, liver cirrhosis, chronic pain, history of alcohol abuse, and arthritis. No significant complaint today, once medication refill. He is on different medication as listed below. Patient is a poor, has some memory loss, here in the clinic today with his caregiver who gives most of the history.   Allergies  Allergen Reactions  . Poison Ivy Extract [Extract Of Poison Ivy] Rash   Past Medical History  Diagnosis Date  . Hypertension   . Hyperlipemia   . Emphysema   . Stroke   . Chronic pain   . Cirrhosis of liver   . Fall   . Dental injury   . Bone spur of other site     Left Foot  . Impaired mobility   . Total self care deficit   . Pneumonia   . Emphysema   . ETOH abuse   . Elevated LFTs   . Arthritis   . Anxiety   . Sinusitis   . Anemia     iron def.  . Hard of hearing    Current Outpatient Prescriptions on File Prior to Visit  Medication Sig Dispense Refill  . albuterol (PROVENTIL HFA;VENTOLIN HFA) 108 (90 BASE) MCG/ACT inhaler Inhale 2 puffs into the lungs every 6 (six) hours as needed. Shortness of breath      . brimonidine (ALPHAGAN) 0.2 % ophthalmic solution Place 1 drop into both eyes 2 (two) times daily.      . budesonide-formoterol (SYMBICORT) 80-4.5 MCG/ACT inhaler Inhale 2 puffs into the lungs 2 (two) times daily. For COPD.      Marland Kitchen diclofenac (CATAFLAM) 50 MG tablet Take 1 tablet (50 mg total) by mouth 3 (three) times daily. Prn back pain.  15 tablet  0  . nicotine (NICODERM CQ - DOSED IN MG/24 HR) 7 mg/24hr patch Place 1 patch onto the skin daily.  28 patch  0  . traMADol (ULTRAM) 50 MG tablet Take 50 mg by mouth 2 (two) times daily as needed. For pain       No  current facility-administered medications on file prior to visit.   History reviewed. No pertinent family history. History   Social History  . Marital Status: Legally Separated    Spouse Name: N/A    Number of Children: N/A  . Years of Education: N/A   Occupational History  . Not on file.   Social History Main Topics  . Smoking status: Current Every Day Smoker -- 2.00 packs/day for 30 years    Types: Cigarettes  . Smokeless tobacco: Never Used  . Alcohol Use: 2.4 oz/week    4 Cans of beer per week     Comment: per pt four  40oz daily beer  . Drug Use: No     Comment: hx cocaine, none in 4 to 5 years - 08/30/12  . Sexual Activity: Yes     Comment: no birth control   Other Topics Concern  . Not on file   Social History Narrative  . No narrative on file    Review of Systems: Constitutional: Negative for fever, chills, diaphoresis, activity change, appetite change and fatigue. HENT: Negative for ear pain, nosebleeds, congestion, facial swelling, rhinorrhea, neck pain, neck  stiffness and ear discharge.  Eyes: Negative for pain, discharge, redness, itching and visual disturbance. Respiratory: Negative for cough, choking, chest tightness, shortness of breath, wheezing and stridor.  Cardiovascular: Negative for chest pain, palpitations and leg swelling. Gastrointestinal: Negative for abdominal distention. Genitourinary: Negative for dysuria, urgency, frequency, hematuria, flank pain, decreased urine volume, difficulty urinating and dyspareunia.  Musculoskeletal: Negative for back pain, joint swelling, arthralgias and gait problem. Neurological: Negative for dizziness, tremors, seizures, syncope, facial asymmetry, speech difficulty, weakness, light-headedness, numbness and headaches.  Hematological: Negative for adenopathy. Does not bruise/bleed easily. Psychiatric/Behavioral: Negative for hallucinations, behavioral problems, confusion, dysphoric mood, decreased concentration and  agitation.    Objective:   Filed Vitals:   02/16/13 1208  BP: 111/78  Pulse: 111  Temp: 98.5 F (36.9 C)  Resp: 16    Physical Exam: Constitutional: Patient appears chronically ill-looking. No distress. HENT: Normocephalic, atraumatic, External right and left ear normal. Oropharynx is clear and moist.  Eyes: Conjunctivae and EOM are normal. PERRLA, no scleral icterus. Neck: Normal ROM. Neck supple. No JVD. No tracheal deviation. No thyromegaly. CVS: RRR, S1/S2 +, no murmurs, no gallops, no carotid bruit.  Pulmonary: Effort and breath sounds normal, no stridor, rhonchi, wheezes, rales.  Abdominal: Soft. BS +,  no distension, tenderness, rebound or guarding.  Musculoskeletal: Normal range of motion. No edema and no tenderness.  Lymphadenopathy: No lymphadenopathy noted, cervical, inguinal or axillary Neuro: Alert. Normal reflexes, muscle tone coordination. No cranial nerve deficit. Skin: Skin is warm and dry. No rash noted. Not diaphoretic. No erythema. No pallor. Psychiatric: Normal mood and affect. Behavior, judgment, thought content normal.  Lab Results  Component Value Date   WBC 5.8 01/17/2013   HGB 8.9* 01/17/2013   HCT 27.0* 01/17/2013   MCV 80.4 01/17/2013   PLT 167 01/17/2013   Lab Results  Component Value Date   CREATININE 0.74 01/17/2013   BUN 7 01/17/2013   NA 137 01/17/2013   K 3.7 01/17/2013   CL 104 01/17/2013   CO2 20 01/17/2013    Lab Results  Component Value Date   HGBA1C 5.4 01/03/2012   Lipid Panel  No results found for this basename: chol, trig, hdl, cholhdl, vldl, ldlcalc       Assessment and plan:   Patient Active Problem List   Diagnosis Date Noted  . Chronic alcohol abuse 02/16/2013  . Liver cirrhosis, alcoholic 02/16/2013  . Depression (emotion) 02/16/2013  . COPD, moderate 02/16/2013  . Aspiration pneumonia 01/16/2013  . UTI (urinary tract infection) 01/16/2013  . Hypokalemia 01/15/2013  . Hypomagnesemia 01/15/2013  . Fever, unspecified  01/15/2013  . Alcohol withdrawal 01/14/2013  . Leukopenia 01/13/2013  . Anemia 01/13/2013  . Orthostatic hypotension 01/13/2013  . Alcohol intoxication 01/12/2013  . Syncope 01/12/2013  . Lactic acidosis 01/12/2013  . Cirrhosis 01/12/2013  . History of stroke 01/12/2013  . Acute alcohol intoxication 08/30/2012  . Iron deficiency 02/04/2012  . Hyponatremia 01/31/2012  . COPD (chronic obstructive pulmonary disease) 01/03/2012   Refill all medications Clear instructions given on compliance Patient was counseled about his diagnosis and medication side effects Ambulatory referral to GI specialist Extensive counseling on smoking cessation Counseling on diet and exercise  CARMINE CARROZZA was given clear instructions to go to ER or return to the clinic if symptoms don't improve, worsen or new problems develop.  Orlando Penner verbalized understanding.  Orlando Penner was told to call to get lab results if hasn't heard anything in the next week.  Jeanann Lewandowsky, MD [facility] [place], [place] [phone]

## 2013-03-25 ENCOUNTER — Ambulatory Visit: Payer: Self-pay | Admitting: Gastroenterology

## 2013-03-26 ENCOUNTER — Other Ambulatory Visit: Payer: Self-pay | Admitting: Internal Medicine

## 2013-03-28 NOTE — Telephone Encounter (Signed)
Medication refill

## 2013-03-29 ENCOUNTER — Other Ambulatory Visit: Payer: Self-pay | Admitting: Internal Medicine

## 2013-03-31 NOTE — Telephone Encounter (Signed)
Medication refill-campral

## 2013-04-06 ENCOUNTER — Encounter (HOSPITAL_COMMUNITY): Payer: Self-pay

## 2013-04-06 ENCOUNTER — Inpatient Hospital Stay (HOSPITAL_COMMUNITY): Payer: Medicare Other

## 2013-04-06 ENCOUNTER — Observation Stay (HOSPITAL_COMMUNITY)
Admission: EM | Admit: 2013-04-06 | Discharge: 2013-04-07 | Disposition: A | Discharge status: Home / Self Care | Payer: Medicare Other | Attending: Internal Medicine | Admitting: Internal Medicine

## 2013-04-06 ENCOUNTER — Emergency Department (HOSPITAL_COMMUNITY): Payer: Medicare Other

## 2013-04-06 DIAGNOSIS — R55 Syncope and collapse: Secondary | ICD-10-CM

## 2013-04-06 DIAGNOSIS — E119 Type 2 diabetes mellitus without complications: Secondary | ICD-10-CM | POA: Insufficient documentation

## 2013-04-06 DIAGNOSIS — E876 Hypokalemia: Secondary | ICD-10-CM

## 2013-04-06 DIAGNOSIS — J690 Pneumonitis due to inhalation of food and vomit: Secondary | ICD-10-CM

## 2013-04-06 DIAGNOSIS — K7030 Alcoholic cirrhosis of liver without ascites: Secondary | ICD-10-CM | POA: Insufficient documentation

## 2013-04-06 DIAGNOSIS — E872 Acidosis: Secondary | ICD-10-CM

## 2013-04-06 DIAGNOSIS — F10929 Alcohol use, unspecified with intoxication, unspecified: Secondary | ICD-10-CM

## 2013-04-06 DIAGNOSIS — I951 Orthostatic hypotension: Secondary | ICD-10-CM

## 2013-04-06 DIAGNOSIS — K7460 Unspecified cirrhosis of liver: Secondary | ICD-10-CM

## 2013-04-06 DIAGNOSIS — R42 Dizziness and giddiness: Principal | ICD-10-CM | POA: Insufficient documentation

## 2013-04-06 DIAGNOSIS — D649 Anemia, unspecified: Secondary | ICD-10-CM

## 2013-04-06 DIAGNOSIS — Z8673 Personal history of transient ischemic attack (TIA), and cerebral infarction without residual deficits: Secondary | ICD-10-CM

## 2013-04-06 DIAGNOSIS — N390 Urinary tract infection, site not specified: Secondary | ICD-10-CM

## 2013-04-06 DIAGNOSIS — F1020 Alcohol dependence, uncomplicated: Secondary | ICD-10-CM | POA: Insufficient documentation

## 2013-04-06 DIAGNOSIS — D72819 Decreased white blood cell count, unspecified: Secondary | ICD-10-CM

## 2013-04-06 DIAGNOSIS — J449 Chronic obstructive pulmonary disease, unspecified: Secondary | ICD-10-CM

## 2013-04-06 DIAGNOSIS — E871 Hypo-osmolality and hyponatremia: Secondary | ICD-10-CM | POA: Insufficient documentation

## 2013-04-06 DIAGNOSIS — E785 Hyperlipidemia, unspecified: Secondary | ICD-10-CM | POA: Insufficient documentation

## 2013-04-06 DIAGNOSIS — D509 Iron deficiency anemia, unspecified: Secondary | ICD-10-CM | POA: Insufficient documentation

## 2013-04-06 DIAGNOSIS — R509 Fever, unspecified: Secondary | ICD-10-CM | POA: Insufficient documentation

## 2013-04-06 DIAGNOSIS — J438 Other emphysema: Secondary | ICD-10-CM | POA: Insufficient documentation

## 2013-04-06 DIAGNOSIS — F17200 Nicotine dependence, unspecified, uncomplicated: Secondary | ICD-10-CM | POA: Insufficient documentation

## 2013-04-06 DIAGNOSIS — E611 Iron deficiency: Secondary | ICD-10-CM

## 2013-04-06 DIAGNOSIS — Z79899 Other long term (current) drug therapy: Secondary | ICD-10-CM | POA: Insufficient documentation

## 2013-04-06 DIAGNOSIS — F10239 Alcohol dependence with withdrawal, unspecified: Secondary | ICD-10-CM

## 2013-04-06 DIAGNOSIS — F1010 Alcohol abuse, uncomplicated: Secondary | ICD-10-CM

## 2013-04-06 DIAGNOSIS — I10 Essential (primary) hypertension: Secondary | ICD-10-CM | POA: Insufficient documentation

## 2013-04-06 DIAGNOSIS — F329 Major depressive disorder, single episode, unspecified: Secondary | ICD-10-CM

## 2013-04-06 HISTORY — DX: Type 2 diabetes mellitus without complications: E11.9

## 2013-04-06 LAB — ETHANOL: Alcohol, Ethyl (B): <11 mg/dL (ref 0–11)

## 2013-04-06 LAB — COMPREHENSIVE METABOLIC PANEL
AST: 29 U/L (ref 0–37)
Albumin: 4.1 g/dL (ref 3.5–5.2)
Alkaline Phosphatase: 76 U/L (ref 39–117)
Chloride: 86 mEq/L — ABNORMAL LOW (ref 96–112)
Creatinine, Ser: 1.13 mg/dL (ref 0.50–1.35)
Potassium: 3.7 mEq/L (ref 3.5–5.1)
Total Bilirubin: 0.5 mg/dL (ref 0.3–1.2)
Total Protein: 8.1 g/dL (ref 6.0–8.3)

## 2013-04-06 LAB — CBC WITH DIFFERENTIAL/PLATELET
Basophils Absolute: 0 10*3/uL (ref 0.0–0.1)
Basophils Relative: 0 % (ref 0–1)
Eosinophils Absolute: 0 10*3/uL (ref 0.0–0.7)
MCHC: 33.9 g/dL (ref 30.0–36.0)
Neutro Abs: 3.3 10*3/uL (ref 1.7–7.7)
Neutrophils Relative %: 65 % (ref 43–77)
RDW: 17.5 % — ABNORMAL HIGH (ref 11.5–15.5)

## 2013-04-06 LAB — MAGNESIUM: Magnesium: 1.8 mg/dL (ref 1.5–2.5)

## 2013-04-06 LAB — PHOSPHORUS: Phosphorus: 3.8 mg/dL (ref 2.3–4.6)

## 2013-04-06 LAB — RAPID URINE DRUG SCREEN, HOSP PERFORMED
Benzodiazepines: NOT DETECTED
Cocaine: NOT DETECTED
Opiates: NOT DETECTED

## 2013-04-06 LAB — GLUCOSE, CAPILLARY: Glucose-Capillary: 111 mg/dL — ABNORMAL HIGH (ref 70–99)

## 2013-04-06 LAB — PROTIME-INR: INR: 0.98 (ref 0.00–1.49)

## 2013-04-06 LAB — URINALYSIS, ROUTINE W REFLEX MICROSCOPIC
Glucose, UA: NEGATIVE mg/dL
Hgb urine dipstick: NEGATIVE
Protein, ur: NEGATIVE mg/dL
pH: 6 (ref 5.0–8.0)

## 2013-04-06 MED ORDER — ADULT MULTIVITAMIN W/MINERALS CH
1.0000 | ORAL_TABLET | Freq: Every day | ORAL | Status: DC
  Administered 2013-04-07: 10:00:00 1 via ORAL
  Filled 2013-04-06: qty 1

## 2013-04-06 MED ORDER — ATORVASTATIN CALCIUM 80 MG PO TABS
80.0000 mg | ORAL_TABLET | Freq: Every day | ORAL | Status: DC
  Administered 2013-04-07: 10:00:00 80 mg via ORAL
  Filled 2013-04-06: qty 1

## 2013-04-06 MED ORDER — ONDANSETRON HCL 4 MG/2ML IJ SOLN: 4.0000 mg | Freq: Four times a day (QID) | INTRAMUSCULAR | Status: DC | PRN

## 2013-04-06 MED ORDER — BRIMONIDINE TARTRATE 0.2 % OP SOLN
1.0000 [drp] | Freq: Two times a day (BID) | OPHTHALMIC | Status: DC
  Administered 2013-04-06 – 2013-04-07 (×2): 1 [drp] via OPHTHALMIC
  Filled 2013-04-06: qty 5

## 2013-04-06 MED ORDER — PANTOPRAZOLE SODIUM 40 MG PO TBEC
40.0000 mg | DELAYED_RELEASE_TABLET | Freq: Every day | ORAL | Status: DC
  Administered 2013-04-07: 40 mg via ORAL
  Filled 2013-04-06: qty 1

## 2013-04-06 MED ORDER — VITAMIN B-12 100 MCG PO TABS
100.0000 ug | ORAL_TABLET | Freq: Every day | ORAL | Status: DC
  Administered 2013-04-07: 100 ug via ORAL
  Filled 2013-04-06: qty 1

## 2013-04-06 MED ORDER — VITAMIN B-1 100 MG PO TABS
100.0000 mg | ORAL_TABLET | Freq: Every day | ORAL | Status: DC
  Administered 2013-04-07: 100 mg via ORAL
  Filled 2013-04-06: qty 1

## 2013-04-06 MED ORDER — BUDESONIDE-FORMOTEROL FUMARATE 80-4.5 MCG/ACT IN AERO
2.0000 | INHALATION_SPRAY | Freq: Two times a day (BID) | RESPIRATORY_TRACT | Status: DC
  Administered 2013-04-06 – 2013-04-07 (×2): 2 via RESPIRATORY_TRACT
  Filled 2013-04-06: qty 6.9

## 2013-04-06 MED ORDER — ACAMPROSATE CALCIUM 333 MG PO TBEC
666.0000 mg | DELAYED_RELEASE_TABLET | Freq: Three times a day (TID) | ORAL | Status: DC
  Administered 2013-04-06 – 2013-04-07 (×2): 666 mg via ORAL
  Filled 2013-04-06 (×4): qty 2

## 2013-04-06 MED ORDER — TRAZODONE HCL 50 MG PO TABS
50.0000 mg | ORAL_TABLET | Freq: Every day | ORAL | Status: DC
Start: 2013-04-06 — End: 2013-04-07
  Administered 2013-04-06: 50 mg via ORAL
  Filled 2013-04-06 (×2): qty 1

## 2013-04-06 MED ORDER — SODIUM CHLORIDE 0.9 % IV SOLN
INTRAVENOUS | Status: AC
  Administered 2013-04-06 – 2013-04-07 (×2): via INTRAVENOUS

## 2013-04-06 MED ORDER — TRAMADOL HCL 50 MG PO TABS: 50.0000 mg | ORAL_TABLET | Freq: Two times a day (BID) | ORAL | Status: DC | PRN

## 2013-04-06 MED ORDER — ENOXAPARIN SODIUM 40 MG/0.4ML ~~LOC~~ SOLN
40.0000 mg | SUBCUTANEOUS | Status: DC
  Administered 2013-04-06: 40 mg via SUBCUTANEOUS
  Filled 2013-04-06 (×2): qty 0.4

## 2013-04-06 MED ORDER — FERROUS SULFATE 325 (65 FE) MG PO TABS
325.0000 mg | ORAL_TABLET | Freq: Every day | ORAL | Status: DC
  Administered 2013-04-07: 325 mg via ORAL
  Filled 2013-04-06 (×2): qty 1

## 2013-04-06 MED ORDER — SODIUM CHLORIDE 0.9 % IV BOLUS (SEPSIS)
500.0000 mL | Freq: Once | INTRAVENOUS | Status: AC
  Administered 2013-04-06: 500 mL via INTRAVENOUS

## 2013-04-06 MED ORDER — DICLOFENAC SODIUM 25 MG PO TBEC
50.0000 mg | DELAYED_RELEASE_TABLET | Freq: Three times a day (TID) | ORAL | Status: DC
  Administered 2013-04-07: 50 mg via ORAL
  Filled 2013-04-06 (×4): qty 2

## 2013-04-06 MED ORDER — CYCLOBENZAPRINE HCL 5 MG PO TABS
5.0000 mg | ORAL_TABLET | Freq: Three times a day (TID) | ORAL | Status: DC | PRN
  Filled 2013-04-06: qty 1

## 2013-04-06 MED ORDER — ONDANSETRON HCL 4 MG PO TABS: 4.0000 mg | ORAL_TABLET | Freq: Four times a day (QID) | ORAL | Status: DC | PRN

## 2013-04-06 MED ORDER — NICOTINE 7 MG/24HR TD PT24
7.0000 mg | MEDICATED_PATCH | TRANSDERMAL | Status: DC
  Administered 2013-04-06: 22:00:00 7 mg via TRANSDERMAL
  Filled 2013-04-06 (×2): qty 1

## 2013-04-06 MED ORDER — HYDROMORPHONE HCL PF 1 MG/ML IJ SOLN: 1.0000 mg | INTRAMUSCULAR | Status: DC | PRN

## 2013-04-06 MED ORDER — FOLIC ACID 1 MG PO TABS
1.0000 mg | ORAL_TABLET | Freq: Every day | ORAL | Status: DC
  Administered 2013-04-07: 10:00:00 1 mg via ORAL
  Filled 2013-04-06: qty 1

## 2013-04-06 MED ORDER — OXYCODONE HCL 5 MG PO TABS: 5.0000 mg | ORAL_TABLET | ORAL | Status: DC | PRN

## 2013-04-06 MED ORDER — SODIUM CHLORIDE 0.9 % IJ SOLN: 3.0000 mL | Freq: Two times a day (BID) | INTRAMUSCULAR | Status: DC

## 2013-04-06 NOTE — ED Notes (Signed)
Pt given urinal. Pt did use urinal but after he urinated in the urinal, he placed a bandage in the urinal. Pt aware that we needed a urine sample and can't use the sample if an object has been dropped into the urine.

## 2013-04-06 NOTE — ED Provider Notes (Signed)
CSN: [id number]     Arrival date & time 04/06/13  1217 History   First MD Initiated Contact with Patient 04/06/13 1312     Chief Complaint  Patient presents with  . Weakness   (Consider location/radiation/quality/duration/timing/severity/associated sxs/prior Treatment) HPI Comments: Dizziness. Patient reports his symptoms have been going on for approximately 2 weeks. Patient reports that his symptoms have been persistent it has not improved or worsened. He has some associated dizziness but no headache. He has not had any chest pain. Patient reports occasional shortness of breath secondary to chronic lung problems, but this improved when he uses inhaler. He might have had a fever last night, because he felt hot and sweaty.  Patient is a 62 y.o. male presenting with weakness.  Weakness    Past Medical History  Diagnosis Date  . Hypertension   . Hyperlipemia   . Emphysema   . Stroke   . Chronic pain   . Cirrhosis of liver   . Fall   . Dental injury   . Bone spur of other site     Left Foot  . Impaired mobility   . Total self care deficit   . Pneumonia   . Emphysema   . ETOH abuse   . Elevated LFTs   . Arthritis   . Anxiety   . Sinusitis   . Anemia     iron def.  . Hard of hearing    Past Surgical History  Procedure Laterality Date  . No past surgeries    . Colonoscopy  01/06/2012    Procedure: COLONOSCOPY;  Surgeon: Theda Belfast, MD;  Location: [facility] ENDOSCOPY;  Service: Endoscopy;  Laterality: N/A;  . Esophagogastroduodenoscopy  01/06/2012    Procedure: ESOPHAGOGASTRODUODENOSCOPY (EGD);  Surgeon: Theda Belfast, MD;  Location: [facility] ENDOSCOPY;  Service: Endoscopy;  Laterality: N/A;  . Esophagogastroduodenoscopy  01/09/2012    Procedure: ESOPHAGOGASTRODUODENOSCOPY (EGD);  Surgeon: Theda Belfast, MD;  Location: [facility] ENDOSCOPY;  Service: Endoscopy;  Laterality: N/A;  . Multiple extractions with alveoloplasty  03/29/2012    Procedure: MULTIPLE EXTRACION WITH ALVEOLOPLASTY;  Surgeon:  Georgia Lopes, DDS;  Location: [facility] OR;  Service: Oral Surgery;  Laterality: Bilateral;  . Bone spur  2013    left spur  . Cataract extraction w/ intraocular lens implant     History reviewed. No pertinent family history. History  Substance Use Topics  . Smoking status: Current Every Day Smoker -- 2.00 packs/day for 30 years    Types: Cigarettes  . Smokeless tobacco: Never Used  . Alcohol Use: 2.4 oz/week    4 Cans of beer per week     Comment: per pt four  40oz daily beer    Review of Systems  Constitutional: Positive for fatigue.  Cardiovascular: Negative.   Neurological: Positive for dizziness and weakness.  All other systems reviewed and are negative.    Allergies  Poison ivy extract  Home Medications   Current Outpatient Rx  Name  Route  Sig  Dispense  Refill  . acamprosate (CAMPRAL) 333 MG tablet      TAKE 2 TABLETS BY MOUTH THREE TIMES A DAY   180 tablet   11   . atorvastatin (LIPITOR) 80 MG tablet   Oral   Take 1 tablet (80 mg total) by mouth daily.   30 tablet   2   . brimonidine (ALPHAGAN) 0.2 % ophthalmic solution   Both Eyes   Place 1 drop into both eyes 2 (two) times daily.         Marland Kitchen  EXPIRED: budesonide-formoterol (SYMBICORT) 80-4.5 MCG/ACT inhaler   Inhalation   Inhale 2 puffs into the lungs 2 (two) times daily. For COPD.         . cyclobenzaprine (FLEXERIL) 5 MG tablet   Oral   Take 1 tablet (5 mg total) by mouth 3 (three) times daily as needed for muscle spasms.   30 tablet   1   . diclofenac (CATAFLAM) 50 MG tablet   Oral   Take 1 tablet (50 mg total) by mouth 3 (three) times daily. Prn back pain.   15 tablet   0   . ferrous sulfate 325 (65 FE) MG tablet   Oral   Take 1 tablet (325 mg total) by mouth daily with breakfast. For low iron.   30 tablet   2   . folic acid (FOLVITE) 1 MG tablet      TAKE 1 TABLET BY MOUTH DAILY   30 tablet   7   . Multiple Vitamin (MULTIVITAMIN) tablet   Oral   Take 1 tablet by mouth daily.    30 tablet   0   . nicotine (NICODERM CQ - DOSED IN MG/24 HR) 7 mg/24hr patch   Transdermal   Place 1 patch onto the skin daily.   28 patch   0   . pantoprazole (PROTONIX) 40 MG tablet   Oral   Take 1 tablet (40 mg total) by mouth daily.   30 tablet   2   . PROAIR HFA 108 (90 BASE) MCG/ACT inhaler      INHALE 2 PUFFS BY MOUTH 4 TIMES DAILY   1 Inhaler   11   . thiamine (VITAMIN B-1) 100 MG tablet   Oral   Take 1 tablet (100 mg total) by mouth daily. For vitamin B deficiency.   30 tablet   2   . traMADol (ULTRAM) 50 MG tablet   Oral   Take 50 mg by mouth 2 (two) times daily as needed. For pain         . traZODone (DESYREL) 50 MG tablet   Oral   Take 1 tablet (50 mg total) by mouth at bedtime.   30 tablet   0   . vitamin B-12 (CYANOCOBALAMIN) 100 MCG tablet   Oral   Take 1 tablet (100 mcg total) by mouth daily. For low B12.   30 tablet   2   . vitamin E 200 UNIT capsule   Oral   Take 1 capsule (200 Units total) by mouth daily.   7 capsule   0    BP 116/81  Pulse 111  Temp(Src) 98.8 F (37.1 C) (Oral)  SpO2 99% Physical Exam  Constitutional: He is oriented to person, place, and time. He appears well-developed and well-nourished. No distress.  HENT:  Head: Normocephalic and atraumatic.  Right Ear: Hearing normal.  Left Ear: Hearing normal.  Nose: Nose normal.  Mouth/Throat: Oropharynx is clear and moist and mucous membranes are normal.  Eyes: Conjunctivae and EOM are normal. Pupils are equal, round, and reactive to light.  Neck: Normal range of motion. Neck supple.  Cardiovascular: Regular rhythm, S1 normal and S2 normal.  Exam reveals no gallop and no friction rub.   No murmur heard. Pulmonary/Chest: Effort normal and breath sounds normal. No respiratory distress. He exhibits no tenderness.  Abdominal: Soft. Normal appearance and bowel sounds are normal. There is no hepatosplenomegaly. There is no tenderness. There is no rebound, no guarding, no  tenderness at McBurney's point  and negative Murphy's sign. No hernia.  Musculoskeletal: Normal range of motion.  Neurological: He is alert and oriented to person, place, and time. He has normal strength. No cranial nerve deficit or sensory deficit. Coordination normal. GCS eye subscore is 4. GCS verbal subscore is 5. GCS motor subscore is 6.  Skin: Skin is warm, dry and intact. No rash noted. No cyanosis.  Psychiatric: He has a normal mood and affect. His speech is normal and behavior is normal. Thought content normal.    ED Course  Procedures (including critical care time) Labs Review Labs Reviewed  GLUCOSE, CAPILLARY - Abnormal; Notable for the following:    Glucose-Capillary 111 (*)    All other components within normal limits  CBC WITH DIFFERENTIAL  COMPREHENSIVE METABOLIC PANEL  URINALYSIS, ROUTINE W REFLEX MICROSCOPIC   Imaging Review No results found.  MDM  Diagnosis: Hyponatremia  She presents here for evaluation of generalized weakness and dizziness for more than a week. This does have multiple comorbidities including alcohol abuse and liver cirrhosis. Patient has history of COPD but does not appear to be in any respiratory distress. Blood work shows hyponatremia with a sodium of 125. This is significantly down from his baseline. This is likely the cause of his symptoms. Because of this and his other comorbidities, he will be placed on observation for rupturing and treatment of hyponatremia.    Gilda Crease, MD 04/06/13 [phone]

## 2013-04-06 NOTE — H&P (Signed)
[facility] History and Physical  Peter [name] ZOX:[id number] DOB: 1951/02/03 DOA: 04/06/2013  Referring physician: ED physician PCP: Cameron Sprang, FNP   Chief Complaint: dizziness   HPI:  Pt is 62 yo male who presented to [facility] ED with main concern of progressively worsening generalized weakness associated with dizziness that initially started one week prior to this admission, associated with poor oral intake, subjective fevers, chills. Pt denies similar events in the past. He explains he has history of liver cirrhosis secondary to alcohol use but denies any recent alcohol use. He denies chest pain or shortness of breath, no specific abdominal or urinary concerns, no specific focal neurological symptoms, no known weight loss or gain.   In ED, pt hemodynamically stable, sodium level found to be 125 which is down from his usual baseline of mid 130's. [facility] asked to admit for observation and evaluation of dizziness. Telemetry bed requested.   Assessment and Plan:  Principal Problem:   Dizziness - unclear etiology and possibly related to hypovolemia and hyponatremia, ? Alcohol intake - will admit to telemetry unit for further monitoring - check orthostatic vitals, TSH, CE's, CXR, alcohol level and UDS - PT evaluation in AM Active Problems:   Hyponatremia - possibly related to poor oral intake and dehydration - will provide gentle hydration given liver cirrhosis - repeat BMP in AM and encourage oral intake    Fever, unspecified - no fever in ED, will check UA, CXR, blood culture    Liver cirrhosis, alcoholic - close monitoring of volume status    COPD (chronic obstructive pulmonary disease) - obtain CXR and monitor vitals per floor protocol - provide oxygen as needed   Iron deficiency - continue iron supplementation  - CBC in AM  Code Status: Full Family Communication: Pt at bedside Disposition Plan: Admit to telemetry bed   Review of Systems:  Constitutional: Negative  for diaphoresis.  HENT: Negative for hearing loss, ear pain, nosebleeds, congestion, sore throat, neck pain, tinnitus and ear discharge.   Eyes: Negative for blurred vision, double vision, photophobia, pain, discharge and redness.  Respiratory: Negative for cough, hemoptysis, sputum production, wheezing and stridor.   Cardiovascular: Negative for chest pain, palpitations, orthopnea, claudication and leg swelling.  Gastrointestinal:  Negative for heartburn, constipation, blood in stool and melena.  Genitourinary: Negative for dysuria, urgency, frequency, hematuria and flank pain.  Musculoskeletal: Negative for myalgias, back pain, joint pain and falls.  Skin: Negative for itching and rash.  Neurological: Negative for tingling, tremors, sensory change, speech change, focal weakness, loss of consciousness and headaches.  Endo/Heme/Allergies: Negative for environmental allergies and polydipsia. Does not bruise/bleed easily.  Psychiatric/Behavioral: Negative for suicidal ideas. The patient is not nervous/anxious.      Past Medical History  Diagnosis Date  . Hypertension   . Hyperlipemia   . Emphysema   . Stroke   . Chronic pain   . Cirrhosis of liver   . Fall   . Dental injury   . Bone spur of other site     Left Foot  . Impaired mobility   . Total self care deficit   . Pneumonia   . Emphysema   . ETOH abuse   . Elevated LFTs   . Arthritis   . Anxiety   . Sinusitis   . Anemia     iron def.  . Hard of hearing   . Diabetes mellitus without complication     Past Surgical History  Procedure Laterality Date  . No past  surgeries    . Colonoscopy  01/06/2012    Procedure: COLONOSCOPY;  Surgeon: Theda Belfast, MD;  Location[facility] END[facility]COPY;  Service: Endoscopy;  Laterality: N/A;  . Esophagogastroduodenoscopy  01/06/2012    Procedure: ESOPHAGOGASTRODUODENOSCOPY (EGD);  Surgeon: Theda Belfast, MD;  Location: [facility] ENDOSCOPY;  Service: Endoscopy;  Laterality: N/A;  .  Esophagogastroduodenoscopy  01/09/2012    Procedure: ESOPHAGOGASTRODUODENOSCOPY (EGD);  Surgeon: Theda Belfast, MD;  Location: [facility] ENDOSCOPY;  Service: Endoscopy;  Laterality: N/A;  . Multiple extractions with alveoloplasty  03/29/2012    Procedure: MULTIPLE EXTRACION WITH ALVEOLOPLASTY;  Surgeon: Georgia Lopes, DDS;  Location: [facility] OR;  Service: Oral Surgery;  Laterality: Bilateral;  . Bone spur  2013    left spur  . Cataract extraction w/ intraocular lens implant      Social History:  reports that he has been smoking Cigarettes.  He has a 60 pack-year smoking history. He has never used smokeless tobacco. He reports that he drinks about 2.4 ounces of alcohol per week. He reports that he does not use illicit drugs.  Allergies  Allergen Reactions  . Poison Ivy Extract [Extract Of Poison Ivy] Rash    History reviewed. No pertinent family history.  Prior to Admission medications   Medication Sig Start Date End Date Taking? Authorizing Provider  acamprosate (CAMPRAL) 333 MG tablet TAKE 2 TABLETS BY MOUTH THREE TIMES A DAY 03/29/13   Dorothea Ogle, MD  atorvastatin (LIPITOR) 80 MG tablet Take 1 tablet (80 mg total) by mouth daily. 02/16/13   Jeanann Lewandowsky, MD  brimonidine (ALPHAGAN) 0.2 % ophthalmic solution Place 1 drop into both eyes 2 (two) times daily.    Historical Provider, MD  budesonide-formoterol (SYMBICORT) 80-4.5 MCG/ACT inhaler Inhale 2 puffs into the lungs 2 (two) times daily. For COPD. 02/04/12 02/03/13  Verne Spurr, PA-C  cyclobenzaprine (FLEXERIL) 5 MG tablet Take 1 tablet (5 mg total) by mouth 3 (three) times daily as needed for muscle spasms. 02/16/13   Jeanann Lewandowsky, MD  diclofenac (CATAFLAM) 50 MG tablet Take 1 tablet (50 mg total) by mouth 3 (three) times daily. Prn back pain. 02/14/13   Linna Hoff, MD  ferrous sulfate 325 (65 FE) MG tablet Take 1 tablet (325 mg total) by mouth daily with breakfast. For low iron. 02/16/13   Jeanann Lewandowsky, MD  folic acid (FOLVITE) 1 MG  tablet TAKE 1 TABLET BY MOUTH DAILY 03/26/13   Dorothea Ogle, MD  Multiple Vitamin (MULTIVITAMIN) tablet Take 1 tablet by mouth daily. 02/16/13   Jeanann Lewandowsky, MD  nicotine (NICODERM CQ - DOSED IN MG/24 HR) 7 mg/24hr patch Place 1 patch onto the skin daily. 05/25/12   Nanine Means, NP  pantoprazole (PROTONIX) 40 MG tablet Take 1 tablet (40 mg total) by mouth daily. 02/16/13   Jeanann Lewandowsky, MD  PROAIR HFA 108 (90 BASE) MCG/ACT inhaler INHALE 2 PUFFS BY MOUTH 4 TIMES DAILY 03/26/13   Dorothea Ogle, MD  thiamine (VITAMIN B-1) 100 MG tablet Take 1 tablet (100 mg total) by mouth daily. For vitamin B deficiency. 02/16/13   Jeanann Lewandowsky, MD  traMADol (ULTRAM) 50 MG tablet Take 50 mg by mouth 2 (two) times daily as needed. For pain    Historical Provider, MD  traZODone (DESYREL) 50 MG tablet Take 1 tablet (50 mg total) by mouth at bedtime. 02/16/13   Jeanann Lewandowsky, MD  vitamin B-12 (CYANOCOBALAMIN) 100 MCG tablet Take 1 tablet (100 mcg total) by mouth daily. For low B12.  02/16/13   Jeanann Lewandowsky, MD  vitamin E 200 UNIT capsule Take 1 capsule (200 Units total) by mouth daily. 02/16/13   Jeanann Lewandowsky, MD    Physical Exam: Filed Vitals:   04/06/13 1234  BP: 116/81  Pulse: 111  Temp: 98.8 F (37.1 C)  TempSrc: Oral  SpO2: 99%    Physical Exam  Constitutional: Appears well-developed and well-nourished. No distress.  HENT: Normocephalic. External right and left ear normal. Dry MM Eyes: Conjunctivae and EOM are normal. PERRLA, no scleral icterus.  Neck: Normal ROM. Neck supple. No JVD. No tracheal deviation. No thyromegaly.  CVS: Regular rhythm, tachycardic, S1/S2 +, no murmurs, no gallops, no carotid bruit.  Pulmonary: Effort and breath sounds normal, no stridor, diminished breath sounds at bases with crackles  Abdominal: Soft. BS +,  Distension noted, no tenderness, rebound or guarding.  Musculoskeletal: Normal range of motion.  Lymphadenopathy: No lymphadenopathy noted,  cervical, inguinal. Neuro: Alert. Normal reflexes, muscle tone coordination. No cranial nerve deficit. Skin: Skin is warm and dry. No rash noted. Not diaphoretic. No erythema. No pallor.  Psychiatric: Normal mood and affect. Behavior, judgment, thought content normal.   Labs on Admission:  Basic Metabolic Panel:  Recent Labs Lab 04/06/13 1315  NA 125*  K 3.7  CL 86*  CO2 23  GLUCOSE 104*  BUN 10  CREATININE 1.13  CALCIUM 9.5   Liver Function Tests:  Recent Labs Lab 04/06/13 1315  AST 29  ALT 19  ALKPHOS 76  BILITOT 0.5  PROT 8.1  ALBUMIN 4.1   CBC:  Recent Labs Lab 04/06/13 1315  WBC 5.1  NEUTROABS 3.3  HGB 10.9*  HCT 32.2*  MCV 77.4*  PLT 222   CBG:  Recent Labs Lab 04/06/13 1240  GLUCAP 111*    Radiological Exams on Admission: Ct Head Wo Contrast  04/06/2013   CLINICAL DATA:  Dizziness  EXAM: CT HEAD WITHOUT CONTRAST  TECHNIQUE: Contiguous axial images were obtained from the base of the skull through the vertex without intravenous contrast. Study was obtained within 24 hr of patient's arrival at the emergency department.  COMPARISON:  January 12, 2013  FINDINGS: Mild diffuse atrophy is stable. There is no mass, hemorrhage, extra-axial fluid collection, or midline shift. There is slight small vessel disease in the centra semiovale bilaterally. There is no acute appearing infarct on this study. The gray-white compartments are otherwise normal.  Bony calvarium appears intact. The mastoid air cells are clear. There is ethmoid sinus disease bilaterally.  IMPRESSION: Atrophy with slight small vessel disease. No intracranial mass, hemorrhage, or acute appearing infarct. There is bilateral ethmoid sinus disease.   Electronically Signed   By: Bretta Bang   On: 04/06/2013 14:26    EKG: Normal sinus rhythm but with tachycardia, no ST/T wave changes  Debbora Presto, MD  [facility] Pager [phone]  If 7PM-7AM, please contact  night-coverage [email or web address] Password [facility] 04/06/2013, 4:27 PM

## 2013-04-06 NOTE — ED Notes (Signed)
He states he is feeling "real weak and dizzy for a while now".  He is alert and in no distress.  He slowly and capably ambulates with his cane.  His only c/o is of some right eye pain.  He states he had cataract surgery on right eye about 1 1/2 weeks ago.

## 2013-04-06 NOTE — ED Notes (Signed)
Brought in by EMS. Patient c/o generalized weakness x 1 week. Patient denies pain.

## 2013-04-07 LAB — COMPREHENSIVE METABOLIC PANEL
ALT: 16 U/L (ref 0–53)
Alkaline Phosphatase: 65 U/L (ref 39–117)
BUN: 10 mg/dL (ref 6–23)
CO2: 25 mEq/L (ref 19–32)
Chloride: 93 mEq/L — ABNORMAL LOW (ref 96–112)
Creatinine, Ser: 0.96 mg/dL (ref 0.50–1.35)
GFR calc Af Amer: >90 mL/min (ref >90)
GFR calc non Af Amer: 88 mL/min — ABNORMAL LOW (ref >90)
Glucose, Bld: 91 mg/dL (ref 70–99)
Potassium: 3.3 mEq/L — ABNORMAL LOW (ref 3.5–5.1)
Sodium: 132 mEq/L — ABNORMAL LOW (ref 135–145)
Total Bilirubin: 0.4 mg/dL (ref 0.3–1.2)
Total Protein: 7 g/dL (ref 6.0–8.3)

## 2013-04-07 LAB — CBC
HCT: 28.9 % — ABNORMAL LOW (ref 39.0–52.0)
Platelets: 180 10*3/uL (ref 150–400)
RBC: 3.68 MIL/uL — ABNORMAL LOW (ref 4.22–5.81)
RDW: 17.5 % — ABNORMAL HIGH (ref 11.5–15.5)
WBC: 2.9 10*3/uL — ABNORMAL LOW (ref 4.0–10.5)

## 2013-04-07 LAB — TROPONIN I
Troponin I: <0.3 ng/mL (ref <0.30)
Troponin I: <0.3 ng/mL (ref <0.30)

## 2013-04-07 MED ORDER — POTASSIUM CHLORIDE CRYS ER 20 MEQ PO TBCR
40.0000 meq | EXTENDED_RELEASE_TABLET | Freq: Once | ORAL | Status: AC
  Administered 2013-04-07: 40 meq via ORAL
  Filled 2013-04-07: qty 2

## 2013-04-07 NOTE — Discharge Summary (Signed)
Physician Discharge Summary  ANNA LIVERS QIO:[id number] DOB: [birth year] DOA: 04/06/2013  PCP: Cameron Sprang, FNP  Admit date: 04/06/2013 Discharge date: 04/07/2013  Recommendations for Outpatient Follow-up:  1. Pt will need to follow up with PCP in 2-3 weeks post discharge 2. Please obtain BMP to evaluate electrolytes and kidney function, potassium level 3. Pt given one dose of K-dur prior to discharge 40 MEQ PO 4. Please also check CBC to evaluate Hg and Hct levels  Discharge Diagnoses: Hypovolemia and dehydration from alcohol abuse  Principal Problem:   Dizziness Active Problems:   Hyponatremia   Fever, unspecified   Liver cirrhosis, alcoholic   COPD (chronic obstructive pulmonary disease)   Iron deficiency   Discharge Condition: Stable  Diet recommendation: Heart healthy diet discussed in details   HPI:  Pt is 62 yo male who presented to [facility] ED with main concern of progressively worsening generalized weakness associated with dizziness that initially started one week prior to this admission, associated with poor oral intake, subjective fevers, chills. Pt denies similar events in the past. He explains he has history of liver cirrhosis secondary to alcohol use but denies any recent alcohol use. He denies chest pain or shortness of breath, no specific abdominal or urinary concerns, no specific focal neurological symptoms, no known weight loss or gain.   In ED, pt hemodynamically stable, sodium level found to be 125 which is down from his usual baseline of mid 130's. [facility] asked to admit for observation and evaluation of dizziness. Telemetry bed requested.   Assessment and Plan:  Principal Problem:  Dizziness secondary to dehydration in the setting of alcohol use and poor oral intake  -  the patient was admitted to telemetry unit, no events on telemetry over 24 hours -  patient also noted to have orthostatic hypotension on admission which was resolved with IV fluids  - patient  was to go home today as he feels stable, recommendation is to continue with physical therapy at home Active Problems:  Hyponatremia  - Secondary to poor oral intake, dehydration and the second of alcohol use   -  patient has responded well to gentle IV fluids, tolerating diet well  - Sodium levels trending up  Fever, unspecified  - no fever in ED, urinalysis and chest x-ray unremarkable for acute events  Liver cirrhosis, alcoholic  - close monitoring of volume status  COPD (chronic obstructive pulmonary disease)  - stable and clinically compensated, is maintaining saturations at target range    Exam: Filed Vitals:   04/07/13 0524  BP: 97/62  Pulse: 79  Temp: 98.3 F (36.8 C)  Resp: 18   Filed Vitals:   04/06/13 1840 04/06/13 1842 04/06/13 2115 04/07/13 0524  BP: 122/69 104/71 101/67 97/62  Pulse: 91 135 85 79  Temp:   99.4 F (37.4 C) 98.3 F (36.8 C)  TempSrc:   Oral Oral  Resp:   19 18  Height:      Weight:      SpO2:   100% 100%    General: Pt is alert, follows commands appropriately, not in acute distress Cardiovascular: Regular rate and rhythm, S1/S2 +, no murmurs, no rubs, no gallops Respiratory: Clear to auscultation bilaterally, no wheezing, no crackles, no rhonchi Abdominal: Soft, non tender, non distended, bowel sounds +, no guarding Extremities: no edema, no cyanosis, pulses palpable bilaterally DP and PT Neuro: Grossly nonfocal  Discharge Instructions  Discharge Orders   Future Appointments Provider Department Dept Phone   04/18/2013 5:15  PM Jeanann Lewandowsky, MD [facility] [phone]   04/26/2013 3:30 PM Louis Meckel, MD [facility] [phone]   Future Orders Complete By Expires   Diet - low sodium heart healthy  As directed    Increase activity slowly  As directed        Medication List         acamprosate 333 MG tablet  Commonly known as:  CAMPRAL  TAKE 2 TABLETS BY MOUTH THREE  TIMES A DAY     atorvastatin 80 MG tablet  Commonly known as:  LIPITOR  Take 1 tablet (80 mg total) by mouth daily.     brimonidine 0.2 % ophthalmic solution  Commonly known as:  ALPHAGAN  Place 1 drop into both eyes 2 (two) times daily.     budesonide-formoterol 80-4.5 MCG/ACT inhaler  Commonly known as:  SYMBICORT  Inhale 2 puffs into the lungs 2 (two) times daily. For COPD.     cyclobenzaprine 5 MG tablet  Commonly known as:  FLEXERIL  Take 1 tablet (5 mg total) by mouth 3 (three) times daily as needed for muscle spasms.     diclofenac 50 MG tablet  Commonly known as:  CATAFLAM  Take 1 tablet (50 mg total) by mouth 3 (three) times daily. Prn back pain.     ferrous sulfate 325 (65 FE) MG tablet  Take 1 tablet (325 mg total) by mouth daily with breakfast. For low iron.     folic acid 1 MG tablet  Commonly known as:  FOLVITE  TAKE 1 TABLET BY MOUTH DAILY     multivitamin tablet  Take 1 tablet by mouth daily.     nicotine 7 mg/24hr patch  Commonly known as:  NICODERM CQ - dosed in mg/24 hr  Place 1 patch onto the skin daily.     pantoprazole 40 MG tablet  Commonly known as:  PROTONIX  Take 1 tablet (40 mg total) by mouth daily.     PROAIR HFA 108 (90 BASE) MCG/ACT inhaler  Generic drug:  albuterol  INHALE 2 PUFFS BY MOUTH 4 TIMES DAILY     thiamine 100 MG tablet  Commonly known as:  VITAMIN B-1  Take 1 tablet (100 mg total) by mouth daily. For vitamin B deficiency.     traZODone 50 MG tablet  Commonly known as:  DESYREL  Take 1 tablet (50 mg total) by mouth at bedtime.     vitamin B-12 100 MCG tablet  Commonly known as:  CYANOCOBALAMIN  Take 1 tablet (100 mcg total) by mouth daily. For low B12.     vitamin E 200 UNIT capsule  Take 1 capsule (200 Units total) by mouth daily.           Follow-up Information   Follow up with Cameron Sprang, FNP In 2 weeks.   Specialty:  Nurse Practitioner   Contact information:   [address] [place]  [address] [phone]       Follow up with Debbora Presto, MD. (As needed if symptoms worsen)    Specialty:  Internal Medicine   Contact information:   [address] [place] [address] [phone]        The results of significant diagnostics from this hospitalization (including imaging, microbiology, ancillary and laboratory) are listed below for reference.     Microbiology: No results found for this or any previous visit (from the past 240 hour(s)).   Labs: Basic Metabolic Panel:  Recent Labs Lab 04/06/13 1315 04/06/13 1929 04/07/13 0535  NA 125*  --  132*  K 3.7  --  3.3*  CL 86*  --  93*  CO2 23  --  25  GLUCOSE 104*  --  91  BUN 10  --  10  CREATININE 1.13  --  0.96  CALCIUM 9.5  --  9.2  MG  --  1.8  --   PHOS  --  3.8  --    Liver Function Tests:  Recent Labs Lab 04/06/13 1315 04/07/13 0535  AST 29 26  ALT 19 16  ALKPHOS 76 65  BILITOT 0.5 0.4  PROT 8.1 7.0  ALBUMIN 4.1 3.5   CBC:  Recent Labs Lab 04/06/13 1315 04/07/13 0535  WBC 5.1 2.9*  NEUTROABS 3.3  --   HGB 10.9* 9.6*  HCT 32.2* 28.9*  MCV 77.4* 78.5  PLT 222 180   Cardiac Enzymes:  Recent Labs Lab 04/06/13 1929 04/07/13 04/07/13 0535  TROPONINI <0.30 <0.30 <0.30   CBG:  Recent Labs Lab 04/06/13 1240  GLUCAP 111*   SIGNED: Time coordinating discharge: Over 30 minutes  Debbora Presto, MD  [facility] 04/07/2013, 9:06 AM Pager [phone]  If 7PM-7AM, please contact night-coverage [email or web address] Password [email or web address]

## 2013-04-07 NOTE — Care Management Note (Addendum)
    Page 1 of 1   04/07/2013     12:40:34 PM   CARE MANAGEMENT NOTE 04/07/2013  Patient:  Peter [name], Peter [name]   Account Number:  [id number]  Date Initiated:  04/07/2013  Documentation initiated by:  [facility]  Subjective/Objective Assessment:   62 Y/O M ADMITTED W/HYPONATREMIA.     Action/Plan:   FROM HOME.USED [facility] IN PAST.HAS CANE,3N1,RW.   Anticipated DC Date:  04/07/2013   Anticipated DC Plan:  HOME W HOME HEALTH SERVICES      DC Planning Services  CM consult      Choice offered to / List presented to:  C-1 Patient        HH arranged  HH-1 RN  HH-2 PT      [facility] agency  [facility]   Status of service:  Completed, signed off Medicare Important Message given?   (If response is "NO", the following Medicare IM given date fields will be blank) Date Medicare IM given:   Date Additional Medicare IM given:    Discharge Disposition:  HOME W HOME HEALTH SERVICES  Per UR Regulation:  Reviewed for med. necessity/level of care/duration of stay  If discussed at Long Length of Stay Meetings, dates discussed:    Comments:  04/06/13 Betina Puckett RN,BSN NCM [phone] PATIENT ALREADY D/C.HHRN/PT.[facility] AWARE. [facility] KRISTEN AWARE OF HHRN.AWAITING PT RECOMMENDATIONS.

## 2013-04-08 LAB — URINE CULTURE: Colony Count: >=100000

## 2013-04-18 ENCOUNTER — Ambulatory Visit: Payer: Self-pay | Admitting: Internal Medicine

## 2013-04-26 ENCOUNTER — Ambulatory Visit (INDEPENDENT_AMBULATORY_CARE_PROVIDER_SITE_OTHER): Payer: Medicare Other | Admitting: Gastroenterology

## 2013-04-26 ENCOUNTER — Encounter: Payer: Self-pay | Admitting: Gastroenterology

## 2013-04-26 VITALS — BP 90/58 | HR 132 | Ht 65.35 in | Wt 142.1 lb

## 2013-04-26 DIAGNOSIS — E611 Iron deficiency: Secondary | ICD-10-CM

## 2013-04-26 DIAGNOSIS — K7030 Alcoholic cirrhosis of liver without ascites: Secondary | ICD-10-CM

## 2013-04-26 DIAGNOSIS — D509 Iron deficiency anemia, unspecified: Secondary | ICD-10-CM

## 2013-04-26 NOTE — Progress Notes (Signed)
History of Present Illness: 62 year old Afro-American male with history of alcohol abuse, COPD, cirrhosis, referred for followup of liver disease.  He was hospitalized earlier this month with dehydration and hyponatremia.  He continues to drink.  If he abstains from alcohol he develops a tremulousness.  Upper endoscopy in July, 2013 demonstrated mild gastritis, questionable portal hypertensive gastropathy and nonbleeding AVM in the distal gastric body.  Colonoscopy was scheduled for an iron deficiency anemia but this was not done.  The patient's main complaint is some dizziness and weakness.  He he denies melena or hematochezia.   Past Medical History  Diagnosis Date  . Hypertension   . Hyperlipemia   . Emphysema   . Stroke   . Chronic pain   . Cirrhosis of liver   . Fall   . Dental injury   . Bone spur of other site     Left Foot  . Impaired mobility   . Total self care deficit   . Pneumonia   . Emphysema   . ETOH abuse   . Elevated LFTs   . Arthritis   . Anxiety   . Sinusitis   . Anemia     iron def.  . Hard of hearing   . Diabetes mellitus without complication   . COPD (chronic obstructive pulmonary disease)   . Depression   . DM (diabetes mellitus)   . Glaucoma    Past Surgical History  Procedure Laterality Date  . Colonoscopy  01/06/2012    Procedure: COLONOSCOPY;  Surgeon: Theda Belfast, MD;  Location: [facility] ENDOSCOPY;  Service: Endoscopy;  Laterality: N/A;  . Esophagogastroduodenoscopy  01/06/2012    Procedure: ESOPHAGOGASTRODUODENOSCOPY (EGD);  Surgeon: Theda Belfast, MD;  Location: [facility] ENDOSCOPY;  Service: Endoscopy;  Laterality: N/A;  . Esophagogastroduodenoscopy  01/09/2012    Procedure: ESOPHAGOGASTRODUODENOSCOPY (EGD);  Surgeon: Theda Belfast, MD;  Location: [facility] ENDOSCOPY;  Service: Endoscopy;  Laterality: N/A;  . Multiple extractions with alveoloplasty  03/29/2012    Procedure: MULTIPLE EXTRACION WITH ALVEOLOPLASTY;  Surgeon: Georgia Lopes, DDS;  Location: [facility] OR;   Service: Oral Surgery;  Laterality: Bilateral;  . Bone spur  2013    left spur  . Cataract extraction w/ intraocular lens implant    . Mouth surgery     family history includes Breast cancer in his mother; Diabetes in his maternal aunt; Heart disease in his maternal aunt and sister. Current Outpatient Prescriptions  Medication Sig Dispense Refill  . acamprosate (CAMPRAL) 333 MG tablet TAKE 2 TABLETS BY MOUTH THREE TIMES A DAY  180 tablet  11  . atorvastatin (LIPITOR) 80 MG tablet Take 1 tablet (80 mg total) by mouth daily.  30 tablet  2  . brimonidine (ALPHAGAN) 0.2 % ophthalmic solution Place 1 drop into both eyes 2 (two) times daily.      . budesonide-formoterol (SYMBICORT) 80-4.5 MCG/ACT inhaler Inhale 2 puffs into the lungs 2 (two) times daily. For COPD.      . cyclobenzaprine (FLEXERIL) 5 MG tablet Take 1 tablet (5 mg total) by mouth 3 (three) times daily as needed for muscle spasms.  30 tablet  1  . diclofenac (CATAFLAM) 50 MG tablet Take 1 tablet (50 mg total) by mouth 3 (three) times daily. Prn back pain.  15 tablet  0  . ferrous sulfate 325 (65 FE) MG tablet Take 1 tablet (325 mg total) by mouth daily with breakfast. For low iron.  30 tablet  2  . folic acid (FOLVITE) 1 MG  tablet TAKE 1 TABLET BY MOUTH DAILY  30 tablet  7  . Multiple Vitamin (MULTIVITAMIN) tablet Take 1 tablet by mouth daily.  30 tablet  0  . nicotine (NICODERM CQ - DOSED IN MG/24 HR) 7 mg/24hr patch Place 1 patch onto the skin daily.  28 patch  0  . pantoprazole (PROTONIX) 40 MG tablet Take 1 tablet (40 mg total) by mouth daily.  30 tablet  2  . PROAIR HFA 108 (90 BASE) MCG/ACT inhaler INHALE 2 PUFFS BY MOUTH 4 TIMES DAILY  1 Inhaler  11  . thiamine (VITAMIN B-1) 100 MG tablet Take 1 tablet (100 mg total) by mouth daily. For vitamin B deficiency.  30 tablet  2  . traZODone (DESYREL) 50 MG tablet Take 1 tablet (50 mg total) by mouth at bedtime.  30 tablet  0  . vitamin B-12 (CYANOCOBALAMIN) 100 MCG tablet Take 1  tablet (100 mcg total) by mouth daily. For low B12.  30 tablet  2  . vitamin E 200 UNIT capsule Take 1 capsule (200 Units total) by mouth daily.  7 capsule  0   No current facility-administered medications for this visit.   Allergies as of 04/26/2013 - Review Complete 04/26/2013  Allergen Reaction Noted  . Poison ivy extract [extract of poison ivy] Rash 01/03/2012    reports that he has been smoking Cigarettes.  He has a 60 pack-year smoking history. He has never used smokeless tobacco. He reports that he drinks about 2.4 ounces of alcohol per week. He reports that he does not use illicit drugs.     Review of Systems: Pertinent positive and negative review of systems were noted in the above HPI section. All other review of systems were otherwise negative.  Vital signs were reviewed in today's medical record Physical Exam: General: Well developed , well nourished, tremulous, in no acute distress Skin: anicteric Head: Normocephalic and atraumatic Eyes:  sclerae anicteric, EOMI Ears: Normal auditory acuity Mouth: No deformity or lesions Neck: Supple, no masses or thyromegaly Lungs: Clear throughout to auscultation Heart: Regular rate and rhythm; no murmurs, rubs or bruits Abdomen: Soft, non tender and non distended. No masses, hepatosplenomegaly or hernias noted. Normal Bowel sounds Rectal:deferred Musculoskeletal: Symmetrical with no gross deformities  Skin: No lesions on visible extremities Pulses:  Normal pulses noted Extremities: No clubbing, cyanosis, edema or deformities noted Neurological: Alert oriented x 4, grossly nonfocal Cervical Nodes:  No significant cervical adenopathy Inguinal Nodes: No significant inguinal adenopathy Psychological:  Alert and cooperative. Normal mood and affect

## 2013-04-26 NOTE — Patient Instructions (Addendum)
You have been given a separate informational sheet regarding your tobacco use, the importance of quitting and local resources to help you quit. You have been scheduled for a colonoscopy and Endoscopy with propofol. Please follow written instructions given to you at your visit today.  Please pick up your prep kit at the pharmacy within the next 1-3 days.  If you use inhalers (even only as needed), please bring them with you on the day of your procedure. Your physician has requested that you go to www.startemmi.com and enter the access code given to you at your visit today. This web site gives a general overview about your procedure. However, you should still follow specific instructions given to you by our office regarding your preparation for the procedure.  We are giving you a [employer]

## 2013-04-26 NOTE — Assessment & Plan Note (Signed)
At a minimum he has alcohol related liver disease and likely has cirrhosis.  Question of portal hypertensive gastropathy was raised by prior endoscopy in 2013.  Unfortunately, patient continues to drink.  Recommendations #1 upper endoscopy to reassess for varices

## 2013-04-26 NOTE — Assessment & Plan Note (Signed)
Plan colonoscopy and repeat upper endoscopy.  If no bleeding source is identified I will obtain stool Hemoccults.  Iron deficiency may also be do to poor nutritional intake

## 2013-04-29 ENCOUNTER — Telehealth: Payer: Self-pay | Admitting: Gastroenterology

## 2013-05-02 ENCOUNTER — Other Ambulatory Visit: Payer: Self-pay | Admitting: Internal Medicine

## 2013-05-06 ENCOUNTER — Encounter: Payer: Self-pay | Admitting: Gastroenterology

## 2013-05-18 ENCOUNTER — Inpatient Hospital Stay: Payer: Self-pay

## 2013-05-19 ENCOUNTER — Ambulatory Visit (AMBULATORY_SURGERY_CENTER): Payer: Medicare Other | Admitting: Gastroenterology

## 2013-05-19 ENCOUNTER — Encounter: Payer: Self-pay | Admitting: Gastroenterology

## 2013-05-19 VITALS — BP 150/89 | HR 108 | Temp 99.2°F | Resp 46 | Ht 65.35 in | Wt 142.0 lb

## 2013-05-19 DIAGNOSIS — D131 Benign neoplasm of stomach: Secondary | ICD-10-CM

## 2013-05-19 DIAGNOSIS — D509 Iron deficiency anemia, unspecified: Secondary | ICD-10-CM

## 2013-05-19 DIAGNOSIS — K2990 Gastroduodenitis, unspecified, without bleeding: Secondary | ICD-10-CM

## 2013-05-19 DIAGNOSIS — K2970 Gastritis, unspecified, without bleeding: Secondary | ICD-10-CM

## 2013-05-19 LAB — GLUCOSE, CAPILLARY: Glucose-Capillary: 84 mg/dL (ref 70–99)

## 2013-05-19 MED ORDER — SODIUM CHLORIDE 0.9 % IV SOLN: 500.0000 mL | INTRAVENOUS | Status: DC

## 2013-05-19 NOTE — Progress Notes (Signed)
Called to room to assist during endoscopic procedure.  Patient ID and intended procedure confirmed with present staff. Received instructions for my participation in the procedure from the performing physician.  

## 2013-05-19 NOTE — Op Note (Signed)
[facility] [address] [place], [address]   ENDOSCOPY PROCEDURE REPORT  PATIENT: Peter [name], Peter [name]  MR#: [id number] BIRTHDATE: 1951-02-21 , 62  yrs. old GENDER: Male ENDOSCOPIST: Louis Meckel, MD REFERRED BY: PROCEDURE DATE:  05/19/2013 PROCEDURE:  EGD w/ biopsy ASA CLASS:     Class II INDICATIONS:  Iron deficiency anemia. MEDICATIONS: There was residual sedation effect present from prior procedure, MAC sedation, administered by CRNA, and propofol (Diprivan) 250mg  IV TOPICAL ANESTHETIC:  DESCRIPTION OF PROCEDURE: After the risks benefits and alternatives of the procedure were thoroughly explained, informed consent was obtained.  The LB [id number] endoscope was introduced through the mouth and advanced to the third portion of the duodenum. Without limitations.  The instrument was slowly withdrawn as the mucosa was fully examined.      In the gastric fundus there are enlarged, edematous folds.  There were streaks of old blood.  There was no active bleeding.  Biopsies were taken.  Slightly enlarged folds were also seen in the gastric cardia.   The remainder of the upper endoscopy exam was otherwise normal.  Retroflexed views revealed no abnormalities.     The scope was then withdrawn from the patient and the procedure completed.  COMPLICATIONS: There were no complications. ENDOSCOPIC IMPRESSION: 1.   In the gastric fundus there are enlarged, edematous folds. There were streaks of old blood.  There was no active bleeding. Biopsies were taken.  Slightly enlarged folds were also seen in the gastric cardia. 2.   The remainder of the upper endoscopy exam was otherwise normal  RECOMMENDATIONS: await biopsy results Continue protonix REPEAT EXAM:  eSigned:  Louis Meckel, MD 05/19/2013 4:56 PM   MW:[id number] Ulyess Mort, MD

## 2013-05-19 NOTE — Progress Notes (Signed)
Pt's blood sugar 84 in the recovery room.  Pt alert he was given oj to drink. No sx of low blood sugar noted. Maw

## 2013-05-19 NOTE — Patient Instructions (Signed)

## 2013-05-19 NOTE — Op Note (Addendum)
[facility] [address] [place], [address]   COLONOSCOPY PROCEDURE REPORT  PATIENT: Peter [name], Peter [name]  MR#: [id number] BIRTHDATE: 1951-04-23 , 62  yrs. old GENDER: Male ENDOSCOPIST: Louis Meckel, MD REFERRED BY:  Arnaldo Natal, MD PROCEDURE DATE:  05/19/2013 PROCEDURE:   Colonoscopy, diagnostic First Screening Colonoscopy - Avg.  risk and is 50 yrs.  old or older Yes.  Prior Negative Screening - Now for repeat screening. N/A  History of Adenoma - Now for follow-up colonoscopy & has been > or = to 3 yrs.  N/A  Polyps Removed Today? No.  Recommend repeat exam, <10 yrs? No. ASA CLASS:   Class III INDICATIONS:Iron Deficiency Anemia. MEDICATIONS: MAC sedation, administered by CRNA and propofol (Diprivan) 250mg  IV  DESCRIPTION OF PROCEDURE:   After the risks benefits and alternatives of the procedure were thoroughly explained, informed consent was obtained.  A digital rectal exam revealed no abnormalities of the rectum.   The LB [id number]  endoscope was introduced through the anus and advanced to the cecum, which was identified by both the appendix and ileocecal valve. No adverse events experienced.   The quality of the prep was Suprep good  The instrument was then slowly withdrawn as the colon was fully examined.      COLON FINDINGS: A normal appearing cecum, ileocecal valve, and appendiceal orifice were identified.  The ascending, hepatic flexure, transverse, splenic flexure, descending, sigmoid colon and rectum appeared unremarkable.  No polyps or cancers were seen. Retroflexed views revealed no abnormalities. The time to cecum=1 minutes 47 seconds.  Withdrawal time=8 minutes 26 seconds.  The scope was withdrawn and the procedure completed. COMPLICATIONS: There were no complications.  ENDOSCOPIC IMPRESSION: Normal colon  RECOMMENDATIONS: Colonoscopy 10 years EGD   eSigned:  Louis Meckel, MD 05/19/2013 4:57 PM Revised: 05/19/2013 4:57  PM  cc:   PATIENT NAME:  Kristen, Bushway MR#: [id number]

## 2013-05-19 NOTE — Progress Notes (Signed)
Upon admission pt. Heart rate=147. Pt. Stated that he had a cold and has been dizzy and passed out several times,last time he passed out was a month ago. Informed Dr. Arlyce Dice and CRNA and requested that they come and evaluate pt. CRNA In to evaluate and Dr. Arlyce Dice in to evaluate and ordered a 12 lead EKG. 12 Lead completed and results given to Dr. Arlyce Dice. Order given to prepare pt. For procedure.

## 2013-05-19 NOTE — Progress Notes (Signed)
Assisted the pt with dressing and in the restroom. Peter [name]

## 2013-05-19 NOTE — Progress Notes (Signed)
Stable to RR 

## 2013-05-20 NOTE — Telephone Encounter (Deleted)
  Follow up Call-  Call back number 05/19/2013  Post procedure Call Back phone  # [phone]  Permission to leave phone message Yes     Patient questions:  Do you have a fever, pain , or abdominal swelling? no Pain Score  0  Have you tolerated food without any problems? yes  Have you been able to return to your normal activities? yes  Do you have any questions about your discharge instructions: Diet   no Medications  no Follow up visit  no  Do you have questions or concerns about your Care? no  Actions: * If pain score is 4 or above: No action needed, pain <4.

## 2013-05-20 NOTE — Telephone Encounter (Signed)
Procedure done.

## 2013-05-27 ENCOUNTER — Other Ambulatory Visit: Payer: Self-pay | Admitting: Internal Medicine

## 2013-05-31 ENCOUNTER — Encounter: Payer: Self-pay | Admitting: Gastroenterology

## 2013-06-07 ENCOUNTER — Ambulatory Visit: Payer: Medicare Other | Attending: Internal Medicine | Admitting: Internal Medicine

## 2013-06-07 VITALS — BP 120/83 | HR 114 | Temp 98.7°F | Resp 14 | Ht 67.0 in | Wt 149.6 lb

## 2013-06-07 DIAGNOSIS — E119 Type 2 diabetes mellitus without complications: Secondary | ICD-10-CM

## 2013-06-07 LAB — CBC WITH DIFFERENTIAL/PLATELET
Eosinophils Absolute: 0 10*3/uL (ref 0.0–0.7)
Eosinophils Relative: 1 % (ref 0–5)
HCT: 30 % — ABNORMAL LOW (ref 39.0–52.0)
Hemoglobin: 10.2 g/dL — ABNORMAL LOW (ref 13.0–17.0)
Lymphs Abs: 2.2 10*3/uL (ref 0.7–4.0)
MCH: 25.9 pg — ABNORMAL LOW (ref 26.0–34.0)
MCV: 76.1 fL — ABNORMAL LOW (ref 78.0–100.0)
Monocytes Absolute: 0.6 10*3/uL (ref 0.1–1.0)
Monocytes Relative: 12 % (ref 3–12)
Platelets: 192 10*3/uL (ref 150–400)
RBC: 3.94 MIL/uL — ABNORMAL LOW (ref 4.22–5.81)

## 2013-06-07 LAB — BASIC METABOLIC PANEL
BUN: 9 mg/dL (ref 6–23)
CO2: 21 mEq/L (ref 19–32)
Calcium: 9.4 mg/dL (ref 8.4–10.5)
Creat: 1.02 mg/dL (ref 0.50–1.35)
Glucose, Bld: 90 mg/dL (ref 70–99)
Sodium: 136 mEq/L (ref 135–145)

## 2013-06-07 MED ORDER — PROPRANOLOL HCL 10 MG PO TABS: 10.0000 mg | ORAL_TABLET | Freq: Three times a day (TID) | ORAL | Status: DC

## 2013-06-07 NOTE — Progress Notes (Unsigned)
Pt is here for a follow up; diabetes and cholesterol check.

## 2013-06-07 NOTE — Progress Notes (Unsigned)
Patient ID: Peter [name], male   DOB: 1951/05/24, 62 y.o.   MRN: [id number]  Patient Demographics  Peter [name], is a 62 y.o. male  HYQ:[id number]  [id number]  DOB - 07-14-1950  Chief Complaint  Patient presents with  . Follow-up        Subjective:   Peter [name] with History of continued alcohol abuse and smoking, alcoholic cirrhosis, recent hyponatremia and hypokalemia secondary to dehydration and poor oral intake requiring hospitalization, COPD,  is here for routine post hospital followup and to establish care. He has no subjective complaints but admits to ongoing alcohol abuse and smoking.  Denies any subjective complaints except as above, no active headache, no chest abdominal pain at this time, not short of breath. No focal weakness which is new.    Objective:    Patient Active Problem List   Diagnosis Date Noted  . Dizziness 04/06/2013  . Chronic alcohol abuse 02/16/2013  . Liver cirrhosis, alcoholic 02/16/2013  . Depression (emotion) 02/16/2013  . COPD, moderate 02/16/2013  . Aspiration pneumonia 01/16/2013  . UTI (urinary tract infection) 01/16/2013  . Hypokalemia 01/15/2013  . Hypomagnesemia 01/15/2013  . Fever, unspecified 01/15/2013  . Alcohol withdrawal 01/14/2013  . Leukopenia 01/13/2013  . Anemia 01/13/2013  . Orthostatic hypotension 01/13/2013  . Alcohol intoxication 01/12/2013  . Syncope 01/12/2013  . Lactic acidosis 01/12/2013  . Cirrhosis 01/12/2013  . History of stroke 01/12/2013  . Acute alcohol intoxication 08/30/2012  . Iron deficiency 02/04/2012  . Hyponatremia 01/31/2012  . COPD (chronic obstructive pulmonary disease) 01/03/2012     Filed Vitals:   06/07/13 1401  BP: 120/83  Pulse: 114  Temp: 98.7 F (37.1 C)  TempSrc: Oral  Resp: 14  Height: 5\' 7"  (1.702 m)  Weight: 149 lb 9.6 oz (67.858 kg)  SpO2: 99%     Exam   Awake Alert, No new F.N deficits, Normal affect [place].AT,PERRAL Supple Neck,No JVD, No cervical  lymphadenopathy appriciated.  Symmetrical Chest wall movement, Good air movement bilaterally, CTAB RRR,No Gallops,Rubs or new Murmurs, No Parasternal Heave +ve B.Sounds, Abd Soft, Non tender, No organomegaly appriciated, No rebound - guarding or rigidity. No Cyanosis, Clubbing or edema, No new Rash or bruise      Data Review   Lab Results  Component Value Date   WBC 2.9* 04/07/2013   HGB 9.6* 04/07/2013   HCT 28.9* 04/07/2013   MCV 78.5 04/07/2013   PLT 180 04/07/2013      Chemistry      Component Value Date/Time   NA 132* 04/07/2013 0535   K 3.3* 04/07/2013 0535   CL 93* 04/07/2013 0535   CO2 25 04/07/2013 0535   BUN 10 04/07/2013 0535   CREATININE 0.96 04/07/2013 0535      Component Value Date/Time   CALCIUM 9.2 04/07/2013 0535   ALKPHOS 65 04/07/2013 0535   AST 26 04/07/2013 0535   ALT 16 04/07/2013 0535   BILITOT 0.4 04/07/2013 0535       Lab Results  Component Value Date   HGBA1C 5.4 06/07/2013    No results found for this basename: CHOL, HDL, LDLCALC, LDLDIRECT, TRIG, CHOLHDL    Lab Results  Component Value Date   TSH 2.856 04/06/2013    No results found for this basename: PSA      Prior to Admission medications   Medication Sig Start Date End Date Taking? Authorizing Provider  acamprosate (CAMPRAL) 333 MG tablet TAKE 2 TABLETS BY MOUTH THREE TIMES A DAY 03/29/13  Yes  Dorothea Ogle, MD  atorvastatin (LIPITOR) 80 MG tablet Take 1 tablet (80 mg total) by mouth daily. 02/16/13  Yes Jeanann Lewandowsky, MD  brimonidine (ALPHAGAN) 0.2 % ophthalmic solution Place 1 drop into both eyes 2 (two) times daily.   Yes Historical Provider, MD  ferrous sulfate 325 (65 FE) MG tablet Take 1 tablet (325 mg total) by mouth daily with breakfast. For low iron. 02/16/13  Yes Jeanann Lewandowsky, MD  folic acid (FOLVITE) 1 MG tablet TAKE 1 TABLET BY MOUTH DAILY 03/26/13  Yes Dorothea Ogle, MD  Multiple Vitamin (MULTIVITAMIN) tablet Take 1 tablet by mouth daily. 02/16/13  Yes Jeanann Lewandowsky, MD   nicotine (NICODERM CQ - DOSED IN MG/24 HR) 7 mg/24hr patch Place 1 patch onto the skin daily. 05/25/12  Yes Nanine Means, NP  pantoprazole (PROTONIX) 40 MG tablet TAKE 1 TABLET BY MOUTH EVERY DAY 05/27/13  Yes Dorothea Ogle, MD  PROAIR HFA 108 [phone] BASE) MCG/ACT inhaler INHALE 2 PUFFS BY MOUTH 4 TIMES DAILY 03/26/13  Yes Dorothea Ogle, MD  thiamine (VITAMIN B-1) 100 MG tablet Take 1 tablet (100 mg total) by mouth daily. For vitamin B deficiency. 02/16/13  Yes Jeanann Lewandowsky, MD  traZODone (DESYREL) 50 MG tablet Take 1 tablet (50 mg total) by mouth at bedtime. 02/16/13  Yes Jeanann Lewandowsky, MD  vitamin B-12 (CYANOCOBALAMIN) 100 MCG tablet Take 1 tablet (100 mcg total) by mouth daily. For low B12. 02/16/13  Yes Jeanann Lewandowsky, MD  vitamin E 200 UNIT capsule Take 1 capsule (200 Units total) by mouth daily. 02/16/13  Yes Jeanann Lewandowsky, MD  budesonide-formoterol (SYMBICORT) 80-4.5 MCG/ACT inhaler Inhale 2 puffs into the lungs 2 (two) times daily. For COPD. 02/04/12 04/26/13  Verne Spurr, PA-C  diclofenac (CATAFLAM) 50 MG tablet Take 1 tablet (50 mg total) by mouth 3 (three) times daily. Prn back pain. 02/14/13   Linna Hoff, MD     Assessment & Plan    Questionable diabetes mellitus type 2. A1c was 5.4 in the office, on Accu-Cheks at home there a few glucose spikes which could relate to his alcoholic binges.    Ongoing alcohol abuse and continued smoking. Counseled him to quit alcohol and smoking. Continue folic acid and thiamine supplementation.   Alcoholic cirrhosis. Following with Dr. Arlyce Dice GI physician, counseled to quit alcohol. Will place him on propranolol.   History of iron deficiency anemia on ferrous sulfate which will be continued.   Recent hospital admission with hyponatremia hypokalemia, he continues to indulge in alcohol abuse with poor intake, will repeat electrolytes, will not be surprised if electrolytes are abnormal.   History of pancytopenia likely due to  alcoholic cirrhosis we'll repeat CBC with differential.   Mild tachycardia. Asymptomatic, blood pressure is stable, will check TSH, place on propranolol for alcoholic cirrhosis which should help with mild tachycardia. This could be related to his severe alcohol abuse.   Recent normal colonoscopy, evidence of old blood on the EGD done by Dr. Arlyce Dice few weeks ago in his stomach. No clear source of bleeding identified, question if he has gastric varices from alcoholic cirrhosis. Continue PPI recommended to follow with Dr. Arlyce Dice on a close basis.     Routine health maintenance.  Flu shot given  Had EGD and colonoscopy done by Dr. Arlyce Dice few weeks ago, anoscopy was unremarkable per patient and his nurse.   Leroy Sea M.D on 06/07/2013 at 2:36 PM    Patient was given clear instructions to go to ER  or return to the clinic if symptoms don't improve, worsen or new problems develop. Patient verbalized understanding. Patient was told to call to get lab results if hasn't heard anything in the next week.

## 2013-06-08 LAB — TSH: TSH: 2.732 u[IU]/mL (ref 0.350–4.500)

## 2013-06-28 ENCOUNTER — Encounter (HOSPITAL_COMMUNITY): Payer: Self-pay | Admitting: Emergency Medicine

## 2013-06-28 ENCOUNTER — Inpatient Hospital Stay (HOSPITAL_COMMUNITY)
Admission: EM | Admit: 2013-06-28 | Discharge: 2013-07-01 | DRG: 690 | Disposition: A | Discharge status: Home / Self Care | Payer: Medicare Other | Attending: Internal Medicine | Admitting: Internal Medicine

## 2013-06-28 DIAGNOSIS — D509 Iron deficiency anemia, unspecified: Secondary | ICD-10-CM | POA: Diagnosis present

## 2013-06-28 DIAGNOSIS — F17200 Nicotine dependence, unspecified, uncomplicated: Secondary | ICD-10-CM | POA: Diagnosis present

## 2013-06-28 DIAGNOSIS — J438 Other emphysema: Secondary | ICD-10-CM | POA: Diagnosis present

## 2013-06-28 DIAGNOSIS — F329 Major depressive disorder, single episode, unspecified: Secondary | ICD-10-CM | POA: Diagnosis present

## 2013-06-28 DIAGNOSIS — E876 Hypokalemia: Secondary | ICD-10-CM

## 2013-06-28 DIAGNOSIS — F10929 Alcohol use, unspecified with intoxication, unspecified: Secondary | ICD-10-CM | POA: Diagnosis present

## 2013-06-28 DIAGNOSIS — R509 Fever, unspecified: Secondary | ICD-10-CM

## 2013-06-28 DIAGNOSIS — D72819 Decreased white blood cell count, unspecified: Secondary | ICD-10-CM

## 2013-06-28 DIAGNOSIS — Z833 Family history of diabetes mellitus: Secondary | ICD-10-CM

## 2013-06-28 DIAGNOSIS — D649 Anemia, unspecified: Secondary | ICD-10-CM

## 2013-06-28 DIAGNOSIS — F1010 Alcohol abuse, uncomplicated: Secondary | ICD-10-CM

## 2013-06-28 DIAGNOSIS — F411 Generalized anxiety disorder: Secondary | ICD-10-CM | POA: Diagnosis present

## 2013-06-28 DIAGNOSIS — F1020 Alcohol dependence, uncomplicated: Secondary | ICD-10-CM | POA: Diagnosis present

## 2013-06-28 DIAGNOSIS — E871 Hypo-osmolality and hyponatremia: Secondary | ICD-10-CM | POA: Diagnosis present

## 2013-06-28 DIAGNOSIS — F10239 Alcohol dependence with withdrawal, unspecified: Secondary | ICD-10-CM

## 2013-06-28 DIAGNOSIS — Z8249 Family history of ischemic heart disease and other diseases of the circulatory system: Secondary | ICD-10-CM

## 2013-06-28 DIAGNOSIS — E785 Hyperlipidemia, unspecified: Secondary | ICD-10-CM | POA: Diagnosis present

## 2013-06-28 DIAGNOSIS — Z79899 Other long term (current) drug therapy: Secondary | ICD-10-CM

## 2013-06-28 DIAGNOSIS — H409 Unspecified glaucoma: Secondary | ICD-10-CM | POA: Diagnosis present

## 2013-06-28 DIAGNOSIS — J449 Chronic obstructive pulmonary disease, unspecified: Secondary | ICD-10-CM | POA: Diagnosis present

## 2013-06-28 DIAGNOSIS — I951 Orthostatic hypotension: Secondary | ICD-10-CM

## 2013-06-28 DIAGNOSIS — F32A Depression, unspecified: Secondary | ICD-10-CM | POA: Diagnosis present

## 2013-06-28 DIAGNOSIS — Z8673 Personal history of transient ischemic attack (TIA), and cerebral infarction without residual deficits: Secondary | ICD-10-CM

## 2013-06-28 DIAGNOSIS — R55 Syncope and collapse: Secondary | ICD-10-CM

## 2013-06-28 DIAGNOSIS — F3289 Other specified depressive episodes: Secondary | ICD-10-CM | POA: Diagnosis present

## 2013-06-28 DIAGNOSIS — K7030 Alcoholic cirrhosis of liver without ascites: Secondary | ICD-10-CM | POA: Diagnosis present

## 2013-06-28 DIAGNOSIS — K7460 Unspecified cirrhosis of liver: Secondary | ICD-10-CM | POA: Diagnosis present

## 2013-06-28 DIAGNOSIS — I10 Essential (primary) hypertension: Secondary | ICD-10-CM | POA: Diagnosis present

## 2013-06-28 DIAGNOSIS — N390 Urinary tract infection, site not specified: Principal | ICD-10-CM | POA: Diagnosis present

## 2013-06-28 DIAGNOSIS — R42 Dizziness and giddiness: Secondary | ICD-10-CM

## 2013-06-28 DIAGNOSIS — J690 Pneumonitis due to inhalation of food and vomit: Secondary | ICD-10-CM

## 2013-06-28 DIAGNOSIS — E872 Acidosis: Secondary | ICD-10-CM

## 2013-06-28 DIAGNOSIS — Z803 Family history of malignant neoplasm of breast: Secondary | ICD-10-CM

## 2013-06-28 DIAGNOSIS — E119 Type 2 diabetes mellitus without complications: Secondary | ICD-10-CM | POA: Diagnosis present

## 2013-06-28 DIAGNOSIS — E611 Iron deficiency: Secondary | ICD-10-CM | POA: Diagnosis present

## 2013-06-28 LAB — SALICYLATE LEVEL: Salicylate Lvl: <2 mg/dL — ABNORMAL LOW (ref 2.8–20.0)

## 2013-06-28 LAB — COMPREHENSIVE METABOLIC PANEL
ALT: 19 U/L (ref 0–53)
AST: 28 U/L (ref 0–37)
Albumin: 3.3 g/dL — ABNORMAL LOW (ref 3.5–5.2)
Alkaline Phosphatase: 91 U/L (ref 39–117)
BUN: 8 mg/dL (ref 6–23)
Chloride: 96 mEq/L (ref 96–112)
Creatinine, Ser: 0.72 mg/dL (ref 0.50–1.35)
Sodium: 130 mEq/L — ABNORMAL LOW (ref 135–145)
Total Bilirubin: 0.2 mg/dL — ABNORMAL LOW (ref 0.3–1.2)
Total Protein: 7.8 g/dL (ref 6.0–8.3)

## 2013-06-28 LAB — CBC
HCT: 24.7 % — ABNORMAL LOW (ref 39.0–52.0)
MCHC: 33.6 g/dL (ref 30.0–36.0)
MCV: 80.2 fL (ref 78.0–100.0)
Platelets: 281 10*3/uL (ref 150–400)
RDW: 17.3 % — ABNORMAL HIGH (ref 11.5–15.5)
WBC: 4.2 10*3/uL (ref 4.0–10.5)

## 2013-06-28 LAB — RAPID URINE DRUG SCREEN, HOSP PERFORMED
Amphetamines: NOT DETECTED
Benzodiazepines: NOT DETECTED
Cocaine: NOT DETECTED
Opiates: NOT DETECTED
Tetrahydrocannabinol: NOT DETECTED

## 2013-06-28 LAB — ACETAMINOPHEN LEVEL: Acetaminophen (Tylenol), Serum: <15 ug/mL (ref 10–30)

## 2013-06-28 LAB — ETHANOL: Alcohol, Ethyl (B): 477 mg/dL (ref 0–11)

## 2013-06-28 MED ORDER — SODIUM CHLORIDE 0.9 % IV BOLUS (SEPSIS)
1000.0000 mL | Freq: Once | INTRAVENOUS | Status: AC
  Administered 2013-06-28: 1000 mL via INTRAVENOUS

## 2013-06-28 MED ORDER — LORAZEPAM 2 MG/ML IJ SOLN
0.0000 mg | Freq: Four times a day (QID) | INTRAMUSCULAR | Status: DC
  Administered 2013-06-28: 2 mg via INTRAVENOUS
  Administered 2013-06-29: 1 mg via INTRAVENOUS
  Filled 2013-06-28 (×2): qty 1

## 2013-06-28 MED ORDER — LORAZEPAM 1 MG PO TABS: 0.0000 mg | ORAL_TABLET | Freq: Two times a day (BID) | ORAL | Status: DC

## 2013-06-28 MED ORDER — LORAZEPAM 1 MG PO TABS: 0.0000 mg | ORAL_TABLET | Freq: Four times a day (QID) | ORAL | Status: DC

## 2013-06-28 MED ORDER — THIAMINE HCL 100 MG/ML IJ SOLN
100.0000 mg | Freq: Every day | INTRAMUSCULAR | Status: DC
  Filled 2013-06-28 (×2): qty 1

## 2013-06-28 MED ORDER — LORAZEPAM 2 MG/ML IJ SOLN: 0.0000 mg | Freq: Two times a day (BID) | INTRAMUSCULAR | Status: DC

## 2013-06-28 MED ORDER — VITAMIN B-1 100 MG PO TABS
100.0000 mg | ORAL_TABLET | Freq: Every day | ORAL | Status: DC
  Administered 2013-06-29 – 2013-07-01 (×3): 100 mg via ORAL
  Filled 2013-06-28 (×3): qty 1

## 2013-06-28 NOTE — ED Notes (Signed)
[phone] Pt's mother Valerie Roys and [phone] ext. [phone] Blenda Bridegroom pt's cousin.  Family took belongings at this time

## 2013-06-28 NOTE — ED Notes (Signed)
Pt comes from home for detox for alcohol.  Pt was found on the floor at home. Pt states last drink was four hours ago. Pt has PMH cirrhosis

## 2013-06-28 NOTE — ED Provider Notes (Signed)
CSN: [id number]     Arrival date & time 06/28/13  1517 History   First MD Initiated Contact with Patient 06/28/13 1546     Chief Complaint  Patient presents with  . Medical Clearance  . detox    (Consider location/radiation/quality/duration/timing/severity/associated sxs/prior Treatment) HPI Comments: Patient is a 62 year old male with history of hypertension, hyperlipidemia, stroke, cirrhosis of liver, alcohol abuse, diabetes, COPD who presents today after being found by his family member. He was on the ground for an unknown period of time. She believes that he drank one fifth of whiskey and 2 40 ounce beers. Who saw him at 4 PM yesterday. She checks on him daily due to his severe alcohol abuse. He generally drinks beer, wine, and whiskey. He has been admitted to the ICU for withdrawal symptoms in the past. He has a history of DTs. The patient is oriented to person and place currently. He is requesting detox.   The history is provided by the patient. No language interpreter was used.    Past Medical History  Diagnosis Date  . Hypertension   . Hyperlipemia   . Emphysema   . Stroke   . Chronic pain   . Cirrhosis of liver   . Fall   . Dental injury   . Bone spur of other site     Left Foot  . Impaired mobility   . Total self care deficit   . Pneumonia   . Emphysema   . ETOH abuse   . Elevated LFTs   . Arthritis   . Anxiety   . Sinusitis   . Anemia     iron def.  . Hard of hearing   . Diabetes mellitus without complication   . COPD (chronic obstructive pulmonary disease)   . Depression   . Glaucoma   . Emphysema of lung   . Cataract    Past Surgical History  Procedure Laterality Date  . Colonoscopy  01/06/2012    Procedure: COLONOSCOPY;  Surgeon: Theda Belfast, MD;  Location: [facility] ENDOSCOPY;  Service: Endoscopy;  Laterality: N/A;  . Esophagogastroduodenoscopy  01/06/2012    Procedure: ESOPHAGOGASTRODUODENOSCOPY (EGD);  Surgeon: Theda Belfast, MD;  Location: [facility] ENDOSCOPY;   Service: Endoscopy;  Laterality: N/A;  . Esophagogastroduodenoscopy  01/09/2012    Procedure: ESOPHAGOGASTRODUODENOSCOPY (EGD);  Surgeon: Theda Belfast, MD;  Location: [facility] ENDOSCOPY;  Service: Endoscopy;  Laterality: N/A;  . Multiple extractions with alveoloplasty  03/29/2012    Procedure: MULTIPLE EXTRACION WITH ALVEOLOPLASTY;  Surgeon: Georgia Lopes, DDS;  Location: [facility] OR;  Service: Oral Surgery;  Laterality: Bilateral;  . Bone spur  2013    left spur  . Cataract extraction w/ intraocular lens implant    . Mouth surgery    . Colonoscopy     Family History  Problem Relation Age of Onset  . Breast cancer Mother   . Diabetes Maternal Aunt   . Heart disease Sister   . Heart disease Maternal Aunt    History  Substance Use Topics  . Smoking status: Current Every Day Smoker -- 2.00 packs/day for 30 years    Types: Cigarettes  . Smokeless tobacco: Never Used  . Alcohol Use: 2.4 oz/week    4 Cans of beer per week     Comment: per pt four  40oz daily beer    Review of Systems  Unable to perform ROS: Other    Allergies  Poison ivy extract  Home Medications   Current Outpatient Rx  Name  Route  Sig  Dispense  Refill  . acamprosate (CAMPRAL) 333 MG tablet      TAKE 2 TABLETS BY MOUTH THREE TIMES A DAY   180 tablet   11   . atorvastatin (LIPITOR) 80 MG tablet   Oral   Take 1 tablet (80 mg total) by mouth daily.   30 tablet   2   . brimonidine (ALPHAGAN) 0.2 % ophthalmic solution   Both Eyes   Place 1 drop into both eyes 2 (two) times daily.         . diclofenac (CATAFLAM) 50 MG tablet   Oral   Take 1 tablet (50 mg total) by mouth 3 (three) times daily. Prn back pain.   15 tablet   0   . ferrous sulfate 325 (65 FE) MG tablet   Oral   Take 1 tablet (325 mg total) by mouth daily with breakfast. For low iron.   30 tablet   2   . folic acid (FOLVITE) 1 MG tablet      TAKE 1 TABLET BY MOUTH DAILY   30 tablet   7   . Multiple Vitamin (MULTIVITAMIN) tablet    Oral   Take 1 tablet by mouth daily.   30 tablet   0   . nicotine (NICODERM CQ - DOSED IN MG/24 HR) 7 mg/24hr patch   Transdermal   Place 1 patch onto the skin daily.   28 patch   0   . pantoprazole (PROTONIX) 40 MG tablet      TAKE 1 TABLET BY MOUTH EVERY DAY   30 tablet   0   . PROAIR HFA 108 (90 BASE) MCG/ACT inhaler      INHALE 2 PUFFS BY MOUTH 4 TIMES DAILY   1 Inhaler   11   . propranolol (INDERAL) 10 MG tablet   Oral   Take 1 tablet (10 mg total) by mouth 3 (three) times daily.   90 tablet   3   . thiamine (VITAMIN B-1) 100 MG tablet   Oral   Take 1 tablet (100 mg total) by mouth daily. For vitamin B deficiency.   30 tablet   2   . traZODone (DESYREL) 50 MG tablet   Oral   Take 1 tablet (50 mg total) by mouth at bedtime.   30 tablet   0   . vitamin B-12 (CYANOCOBALAMIN) 100 MCG tablet   Oral   Take 1 tablet (100 mcg total) by mouth daily. For low B12.   30 tablet   2   . vitamin E 200 UNIT capsule   Oral   Take 1 capsule (200 Units total) by mouth daily.   7 capsule   0   . EXPIRED: budesonide-formoterol (SYMBICORT) 80-4.5 MCG/ACT inhaler   Inhalation   Inhale 2 puffs into the lungs 2 (two) times daily. For COPD.          BP 90/57  Pulse 109  Temp(Src) 98.6 F (37 C) (Oral)  Resp 20  SpO2 98% Physical Exam  Nursing note and vitals reviewed. Constitutional: He is oriented to person, place, and time. He appears well-developed and well-nourished. No distress.  HENT:  Head: Normocephalic and atraumatic.  Right Ear: External ear normal.  Left Ear: External ear normal.  Nose: Nose normal.  Eyes: Conjunctivae are normal.  Neck: Normal range of motion. No tracheal deviation present.  Cardiovascular: Normal rate, regular rhythm and normal heart sounds.   Pulmonary/Chest: Effort normal and  breath sounds normal. No stridor.  Abdominal: Soft. He exhibits no distension. There is no tenderness.  Musculoskeletal: Normal range of motion.   Neurological: He is alert and oriented to person, place, and time.  Skin: Skin is warm and dry. He is not diaphoretic.  Slurred speech  Psychiatric: He has a normal mood and affect. His behavior is normal. His speech is slurred. He expresses no homicidal and no suicidal ideation.    ED Course  Procedures (including critical care time) Labs Review Labs Reviewed  CBC - Abnormal; Notable for the following:    RBC 3.08 (*)    Hemoglobin 8.3 (*)    HCT 24.7 (*)    RDW 17.3 (*)    All other components within normal limits  COMPREHENSIVE METABOLIC PANEL - Abnormal; Notable for the following:    Sodium 130 (*)    CO2 15 (*)    Albumin 3.3 (*)    Total Bilirubin 0.2 (*)    All other components within normal limits  ETHANOL - Abnormal; Notable for the following:    Alcohol, Ethyl (B) 477 (*)    All other components within normal limits  SALICYLATE LEVEL - Abnormal; Notable for the following:    Salicylate Lvl <2.0 (*)    All other components within normal limits  ACETAMINOPHEN LEVEL  URINE RAPID DRUG SCREEN (HOSP PERFORMED)  CK  ETHANOL   Imaging Review No results found.  EKG Interpretation   None       MDM   1. Dizziness   2. Alcohol intoxication   3. Alcohol withdrawal   4. Depression (emotion)   5. Acute alcohol intoxication    Pt presents to ED with etoh level of 477. Pt brought in by family member after finding him passed out on the ground. Pt is requesting detox. Placed on CIWA protocol in ED. I attempted to have this patient admitted medically for detox due to history of DTs, but Dr. Julian Reil felt that because the patient was not actively seizing he was not a candidate for medical admission. I do not feel comfortable sending this patient home for detox and will consult to TTS for possible placement in detox facility. Will continue CIWA in ED. Vital signs stable at this point. Discussed case with Dr. Micheline Maze who agrees with plan. Patient / Family / Caregiver informed  of clinical course, understand medical decision-making process, and agree with plan.     Mora Bellman, PA-C 06/29/13 1900

## 2013-06-28 NOTE — ED Notes (Signed)
Critical lab ETOH 477.

## 2013-06-29 ENCOUNTER — Emergency Department (HOSPITAL_COMMUNITY): Payer: Medicare Other

## 2013-06-29 ENCOUNTER — Encounter (HOSPITAL_COMMUNITY): Payer: Self-pay | Admitting: Registered Nurse

## 2013-06-29 DIAGNOSIS — F10239 Alcohol dependence with withdrawal, unspecified: Secondary | ICD-10-CM

## 2013-06-29 DIAGNOSIS — F1010 Alcohol abuse, uncomplicated: Secondary | ICD-10-CM

## 2013-06-29 DIAGNOSIS — R42 Dizziness and giddiness: Secondary | ICD-10-CM

## 2013-06-29 LAB — URINE MICROSCOPIC-ADD ON

## 2013-06-29 LAB — CBC WITH DIFFERENTIAL/PLATELET
Basophils Absolute: 0 10*3/uL (ref 0.0–0.1)
Basophils Relative: 0 % (ref 0–1)
Eosinophils Relative: 0 % (ref 0–5)
HCT: 28.7 % — ABNORMAL LOW (ref 39.0–52.0)
Hemoglobin: 9.7 g/dL — ABNORMAL LOW (ref 13.0–17.0)
MCH: 27.1 pg (ref 26.0–34.0)
MCHC: 33.8 g/dL (ref 30.0–36.0)
MCV: 80.2 fL (ref 78.0–100.0)
Monocytes Absolute: 0.9 10*3/uL (ref 0.1–1.0)
Monocytes Relative: 16 % — ABNORMAL HIGH (ref 3–12)
Neutrophils Relative %: 64 % (ref 43–77)
RDW: 17.7 % — ABNORMAL HIGH (ref 11.5–15.5)

## 2013-06-29 LAB — COMPREHENSIVE METABOLIC PANEL
AST: 27 U/L (ref 0–37)
Albumin: 3.7 g/dL (ref 3.5–5.2)
Alkaline Phosphatase: 95 U/L (ref 39–117)
BUN: 7 mg/dL (ref 6–23)
Calcium: 9.2 mg/dL (ref 8.4–10.5)
Chloride: 103 mEq/L (ref 96–112)
Creatinine, Ser: 0.73 mg/dL (ref 0.50–1.35)
GFR calc Af Amer: >90 mL/min (ref >90)
GFR calc non Af Amer: >90 mL/min (ref >90)
Glucose, Bld: 87 mg/dL (ref 70–99)
Potassium: 3.7 mEq/L (ref 3.5–5.1)

## 2013-06-29 LAB — MAGNESIUM: Magnesium: 1.5 mg/dL (ref 1.5–2.5)

## 2013-06-29 LAB — URINALYSIS, ROUTINE W REFLEX MICROSCOPIC
Bilirubin Urine: NEGATIVE
Glucose, UA: NEGATIVE mg/dL
Ketones, ur: NEGATIVE mg/dL
Nitrite: NEGATIVE
Protein, ur: NEGATIVE mg/dL
Urobilinogen, UA: 0.2 mg/dL (ref 0.0–1.0)
pH: 6.5 (ref 5.0–8.0)

## 2013-06-29 LAB — CK: Total CK: 126 U/L (ref 7–232)

## 2013-06-29 LAB — AMMONIA: Ammonia: 73 umol/L — ABNORMAL HIGH (ref 11–60)

## 2013-06-29 LAB — TROPONIN I: Troponin I: <0.3 ng/mL (ref <0.30)

## 2013-06-29 LAB — ETHANOL: Alcohol, Ethyl (B): <11 mg/dL (ref 0–11)

## 2013-06-29 MED ORDER — PREDNISOLONE ACETATE 0.12 % OP SUSP: 1.0000 [drp] | Freq: Four times a day (QID) | OPHTHALMIC | Status: DC

## 2013-06-29 MED ORDER — DEXTROSE 5 % IV SOLN
1.0000 g | INTRAVENOUS | Status: DC
  Administered 2013-06-29 – 2013-06-30 (×2): 1 g via INTRAVENOUS
  Filled 2013-06-29 (×4): qty 10

## 2013-06-29 MED ORDER — BUDESONIDE-FORMOTEROL FUMARATE 80-4.5 MCG/ACT IN AERO
2.0000 | INHALATION_SPRAY | Freq: Two times a day (BID) | RESPIRATORY_TRACT | Status: DC
  Administered 2013-06-29 – 2013-07-01 (×4): 2 via RESPIRATORY_TRACT
  Filled 2013-06-29: qty 6.9

## 2013-06-29 MED ORDER — IBUPROFEN 600 MG PO TABS
600.0000 mg | ORAL_TABLET | Freq: Three times a day (TID) | ORAL | Status: DC | PRN
  Filled 2013-06-29: qty 1

## 2013-06-29 MED ORDER — ATORVASTATIN CALCIUM 80 MG PO TABS
80.0000 mg | ORAL_TABLET | Freq: Every day | ORAL | Status: DC
  Administered 2013-06-29 – 2013-07-01 (×3): 80 mg via ORAL
  Filled 2013-06-29 (×3): qty 1

## 2013-06-29 MED ORDER — ALUM & MAG HYDROXIDE-SIMETH 200-200-20 MG/5ML PO SUSP: 30.0000 mL | ORAL | Status: DC | PRN

## 2013-06-29 MED ORDER — HYDROMORPHONE HCL PF 1 MG/ML IJ SOLN: 1.0000 mg | INTRAMUSCULAR | Status: DC | PRN

## 2013-06-29 MED ORDER — ONDANSETRON HCL 4 MG/2ML IJ SOLN: 4.0000 mg | Freq: Four times a day (QID) | INTRAMUSCULAR | Status: DC | PRN

## 2013-06-29 MED ORDER — BRIMONIDINE TARTRATE 0.2 % OP SOLN
1.0000 [drp] | Freq: Two times a day (BID) | OPHTHALMIC | Status: DC
  Administered 2013-06-29 – 2013-07-01 (×5): 1 [drp] via OPHTHALMIC
  Filled 2013-06-29 (×2): qty 5

## 2013-06-29 MED ORDER — SODIUM CHLORIDE 0.9 % IV SOLN
INTRAVENOUS | Status: DC
  Administered 2013-06-29: 15:00:00 via INTRAVENOUS

## 2013-06-29 MED ORDER — PROPRANOLOL HCL 10 MG PO TABS
10.0000 mg | ORAL_TABLET | Freq: Three times a day (TID) | ORAL | Status: DC
Start: 2013-06-29 — End: 2013-07-01
  Administered 2013-06-29 – 2013-07-01 (×7): 10 mg via ORAL
  Filled 2013-06-29 (×9): qty 1

## 2013-06-29 MED ORDER — VITAMIN E 45 MG (100 UNIT) PO CAPS
200.0000 [IU] | ORAL_CAPSULE | Freq: Every day | ORAL | Status: DC
  Administered 2013-06-29 – 2013-07-01 (×3): 200 [IU] via ORAL
  Filled 2013-06-29 (×3): qty 2

## 2013-06-29 MED ORDER — ALBUTEROL SULFATE HFA 108 (90 BASE) MCG/ACT IN AERS
2.0000 | INHALATION_SPRAY | Freq: Four times a day (QID) | RESPIRATORY_TRACT | Status: DC
  Administered 2013-06-29 – 2013-07-01 (×5): 2 via RESPIRATORY_TRACT
  Filled 2013-06-29: qty 6.7

## 2013-06-29 MED ORDER — LORAZEPAM 1 MG PO TABS: 1.0000 mg | ORAL_TABLET | Freq: Three times a day (TID) | ORAL | Status: DC | PRN

## 2013-06-29 MED ORDER — CHLORDIAZEPOXIDE HCL 25 MG PO CAPS
25.0000 mg | ORAL_CAPSULE | Freq: Once | ORAL | Status: DC
  Filled 2013-06-29: qty 1

## 2013-06-29 MED ORDER — CHLORDIAZEPOXIDE HCL 25 MG PO CAPS: 25.0000 mg | ORAL_CAPSULE | Freq: Every day | ORAL | Status: DC

## 2013-06-29 MED ORDER — DICLOFENAC SODIUM 50 MG PO TBEC
50.0000 mg | DELAYED_RELEASE_TABLET | Freq: Three times a day (TID) | ORAL | Status: DC
  Administered 2013-06-29 – 2013-07-01 (×7): 50 mg via ORAL
  Filled 2013-06-29 (×9): qty 1

## 2013-06-29 MED ORDER — LOPERAMIDE HCL 2 MG PO CAPS: 2.0000 mg | ORAL_CAPSULE | ORAL | Status: DC | PRN

## 2013-06-29 MED ORDER — VITAMIN B-1 100 MG PO TABS: 100.0000 mg | ORAL_TABLET | Freq: Every day | ORAL | Status: DC

## 2013-06-29 MED ORDER — DICLOFENAC POTASSIUM 50 MG PO TABS: 50.0000 mg | ORAL_TABLET | Freq: Three times a day (TID) | ORAL | Status: DC

## 2013-06-29 MED ORDER — ONDANSETRON HCL 4 MG PO TABS: 4.0000 mg | ORAL_TABLET | Freq: Three times a day (TID) | ORAL | Status: DC | PRN

## 2013-06-29 MED ORDER — ADULT MULTIVITAMIN W/MINERALS CH
1.0000 | ORAL_TABLET | Freq: Every day | ORAL | Status: DC
  Administered 2013-06-29 – 2013-07-01 (×3): 1 via ORAL
  Filled 2013-06-29 (×3): qty 1

## 2013-06-29 MED ORDER — TRAZODONE HCL 50 MG PO TABS
50.0000 mg | ORAL_TABLET | Freq: Every day | ORAL | Status: DC
  Administered 2013-06-29 – 2013-06-30 (×2): 50 mg via ORAL
  Filled 2013-06-29 (×4): qty 1

## 2013-06-29 MED ORDER — CHLORDIAZEPOXIDE HCL 25 MG PO CAPS
25.0000 mg | ORAL_CAPSULE | Freq: Three times a day (TID) | ORAL | Status: AC
  Administered 2013-06-30 (×3): 25 mg via ORAL
  Filled 2013-06-29 (×3): qty 1

## 2013-06-29 MED ORDER — ENOXAPARIN SODIUM 40 MG/0.4ML ~~LOC~~ SOLN
40.0000 mg | SUBCUTANEOUS | Status: DC
  Administered 2013-06-29 – 2013-06-30 (×2): 40 mg via SUBCUTANEOUS
  Filled 2013-06-29 (×3): qty 0.4

## 2013-06-29 MED ORDER — ZOLPIDEM TARTRATE 5 MG PO TABS: 5.0000 mg | ORAL_TABLET | Freq: Every evening | ORAL | Status: DC | PRN

## 2013-06-29 MED ORDER — ONE-DAILY MULTI VITAMINS PO TABS: 1.0000 | ORAL_TABLET | Freq: Every day | ORAL | Status: DC

## 2013-06-29 MED ORDER — CHLORDIAZEPOXIDE HCL 25 MG PO CAPS
25.0000 mg | ORAL_CAPSULE | Freq: Four times a day (QID) | ORAL | Status: AC
  Administered 2013-06-29 (×4): 25 mg via ORAL
  Filled 2013-06-29 (×2): qty 1

## 2013-06-29 MED ORDER — HYDROCODONE-ACETAMINOPHEN 5-325 MG PO TABS: 1.0000 | ORAL_TABLET | ORAL | Status: DC | PRN

## 2013-06-29 MED ORDER — SODIUM CHLORIDE 0.9 % IV SOLN
Freq: Once | INTRAVENOUS | Status: AC
  Administered 2013-06-29: 06:00:00 via INTRAVENOUS

## 2013-06-29 MED ORDER — FOLIC ACID 1 MG PO TABS
1.0000 mg | ORAL_TABLET | Freq: Every day | ORAL | Status: DC
Start: 2013-06-29 — End: 2013-07-01
  Administered 2013-06-29 – 2013-07-01 (×3): 1 mg via ORAL
  Filled 2013-06-29 (×3): qty 1

## 2013-06-29 MED ORDER — FERROUS SULFATE 325 (65 FE) MG PO TABS
325.0000 mg | ORAL_TABLET | Freq: Every day | ORAL | Status: DC
  Administered 2013-06-30 – 2013-07-01 (×2): 325 mg via ORAL
  Filled 2013-06-29 (×3): qty 1

## 2013-06-29 MED ORDER — ONDANSETRON 4 MG PO TBDP
4.0000 mg | ORAL_TABLET | Freq: Four times a day (QID) | ORAL | Status: DC | PRN
Start: 2013-06-29 — End: 2013-06-29
  Filled 2013-06-29: qty 1

## 2013-06-29 MED ORDER — ONDANSETRON HCL 4 MG PO TABS: 4.0000 mg | ORAL_TABLET | Freq: Four times a day (QID) | ORAL | Status: DC | PRN

## 2013-06-29 MED ORDER — CHLORDIAZEPOXIDE HCL 25 MG PO CAPS
25.0000 mg | ORAL_CAPSULE | ORAL | Status: DC
  Administered 2013-07-01: 25 mg via ORAL
  Filled 2013-06-29: qty 1

## 2013-06-29 MED ORDER — PANTOPRAZOLE SODIUM 40 MG PO TBEC
40.0000 mg | DELAYED_RELEASE_TABLET | Freq: Every day | ORAL | Status: DC
  Administered 2013-06-29 – 2013-07-01 (×3): 40 mg via ORAL
  Filled 2013-06-29 (×3): qty 1

## 2013-06-29 MED ORDER — ACETAMINOPHEN 325 MG PO TABS: 650.0000 mg | ORAL_TABLET | ORAL | Status: DC | PRN

## 2013-06-29 MED ORDER — ACAMPROSATE CALCIUM 333 MG PO TBEC
333.0000 mg | DELAYED_RELEASE_TABLET | Freq: Three times a day (TID) | ORAL | Status: DC
  Administered 2013-06-29 – 2013-07-01 (×7): 333 mg via ORAL
  Filled 2013-06-29 (×10): qty 1

## 2013-06-29 MED ORDER — NICOTINE 21 MG/24HR TD PT24
21.0000 mg | MEDICATED_PATCH | Freq: Every day | TRANSDERMAL | Status: DC
  Administered 2013-06-29 – 2013-06-30 (×2): 21 mg via TRANSDERMAL
  Filled 2013-06-29 (×3): qty 1

## 2013-06-29 MED ORDER — CHLORDIAZEPOXIDE HCL 25 MG PO CAPS: 25.0000 mg | ORAL_CAPSULE | Freq: Four times a day (QID) | ORAL | Status: DC | PRN

## 2013-06-29 MED ORDER — HYDROXYZINE HCL 25 MG PO TABS
25.0000 mg | ORAL_TABLET | Freq: Four times a day (QID) | ORAL | Status: DC | PRN
  Filled 2013-06-29: qty 1

## 2013-06-29 NOTE — [facility] Counselor (Signed)
Writer faxed referral to [facility] as they have beds.  Evette Cristal, [place] Assessment Counselor

## 2013-06-29 NOTE — BH Assessment (Signed)
Assessment Note  Peter [name] is an 62 y.o. male. Patient presents seeking detox from alcohol. Patient states that he is tired of feeling "bad" and that he feels "empty" inside.   Patient states that he last went through detox in November of 2013. He was able to stay sober for 2 months which has been his longest period of sobriety. He states that he is drinking more than he is eating and that he can't sleep without drinking. He does not want any long term treatment (rehabilitation) at this time but does desire detox.  He denies any SI,HI,AVH.    Axis I: Alcohol Dependence; Depressive Disorder NOS Axis II: Deferred Axis III:  Past Medical History  Diagnosis Date  . Hypertension   . Hyperlipemia   . Emphysema   . Stroke   . Chronic pain   . Cirrhosis of liver   . Fall   . Dental injury   . Bone spur of other site     Left Foot  . Impaired mobility   . Total self care deficit   . Pneumonia   . Emphysema   . ETOH abuse   . Elevated LFTs   . Arthritis   . Anxiety   . Sinusitis   . Anemia     iron def.  . Hard of hearing   . Diabetes mellitus without complication   . COPD (chronic obstructive pulmonary disease)   . Depression   . Glaucoma   . Emphysema of lung   . Cataract    Axis IV: other psychosocial or environmental problems and problems related to social environment Axis V: 35  Past Medical History:  Past Medical History  Diagnosis Date  . Hypertension   . Hyperlipemia   . Emphysema   . Stroke   . Chronic pain   . Cirrhosis of liver   . Fall   . Dental injury   . Bone spur of other site     Left Foot  . Impaired mobility   . Total self care deficit   . Pneumonia   . Emphysema   . ETOH abuse   . Elevated LFTs   . Arthritis   . Anxiety   . Sinusitis   . Anemia     iron def.  . Hard of hearing   . Diabetes mellitus without complication   . COPD (chronic obstructive pulmonary disease)   . Depression   . Glaucoma   . Emphysema of lung   .  Cataract     Past Surgical History  Procedure Laterality Date  . Colonoscopy  01/06/2012    Procedure: COLONOSCOPY;  Surgeon: Theda Belfast, MD;  Location: [facility] ENDOSCOPY;  Service: Endoscopy;  Laterality: N/A;  . Esophagogastroduodenoscopy  01/06/2012    Procedure: ESOPHAGOGASTRODUODENOSCOPY (EGD);  Surgeon: Theda Belfast, MD;  Location: [facility] ENDOSCOPY;  Service: Endoscopy;  Laterality: N/A;  . Esophagogastroduodenoscopy  01/09/2012    Procedure: ESOPHAGOGASTRODUODENOSCOPY (EGD);  Surgeon: Theda Belfast, MD;  Location: [facility] ENDOSCOPY;  Service: Endoscopy;  Laterality: N/A;  . Multiple extractions with alveoloplasty  03/29/2012    Procedure: MULTIPLE EXTRACION WITH ALVEOLOPLASTY;  Surgeon: Georgia Lopes, DDS;  Location: [facility] OR;  Service: Oral Surgery;  Laterality: Bilateral;  . Bone spur  2013    left spur  . Cataract extraction w/ intraocular lens implant    . Mouth surgery    . Colonoscopy      Family History:  Family History  Problem Relation Age of  Onset  . Breast cancer Mother   . Diabetes Maternal Aunt   . Heart disease Sister   . Heart disease Maternal Aunt     Social History:  reports that he has been smoking Cigarettes.  He has a 60 pack-year smoking history. He has never used smokeless tobacco. He reports that he drinks about 2.4 ounces of alcohol per week. He reports that he does not use illicit drugs.  Additional Social History:  Alcohol / Drug Use History of alcohol / drug use?: Yes Substance #1 Name of Substance 1: Etoh 1 - Age of First Use: 10 1 - Amount (size/oz): (2) 40 oz beers 1 - Frequency: Daily 1 - Duration: 3 years 1 - Last Use / Amount: (3) 40 oz beers  CIWA: CIWA-Ar BP: 137/92 mmHg Pulse Rate: 120 Nausea and Vomiting: no nausea and no vomiting Tactile Disturbances: none Tremor: two Auditory Disturbances: not present Paroxysmal Sweats: no sweat visible Visual Disturbances: not present Anxiety: mildly anxious Headache, Fullness in Head: none  present Agitation: somewhat more than normal activity Orientation and Clouding of Sensorium: oriented and can do serial additions CIWA-Ar Total: 4 COWS:    Allergies:  Allergies  Allergen Reactions  . Poison Ivy Extract [Extract Of Poison Ivy] Rash    Home Medications:  (Not in a hospital admission)  OB/GYN Status:  No LMP for male patient.  General Assessment Data Location of Assessment: [facility] Services Is this a Tele or Face-to-Face Assessment?: Tele Assessment Is this an Initial Assessment or a Re-assessment for this encounter?: Initial Assessment Living Arrangements: Alone Can pt return to current living arrangement?: Yes Admission Status: Voluntary Is patient capable of signing voluntary admission?: Yes Transfer from: Home Referral Source: MD  Medical Screening Exam [facility] Walk-in ONLY) Medical Exam completed: Yes  [facility] Crisis Care Plan Living Arrangements: Alone Name of Psychiatrist: None Name of Therapist: None  Education Status Is patient currently in school?: No Current Grade: Na Highest grade of school patient has completed: 12th Name of school: [employer] Contact person:  [facility] [employer] [facility] Williams/ mother)  Risk to self Suicidal Ideation: No Suicidal Intent: No Is patient at risk for suicide?: No Suicidal Plan?: No Access to Means: No What has been your use of drugs/alcohol within the last 12 months?: Daily Previous Attempts/Gestures: No How many times?:  (None) Other Self Harm Risks:  (None) Triggers for Past Attempts: None known Intentional Self Injurious Behavior: None Family Suicide History: No Recent stressful life event(s): Recent negative physical changes (Feels bad all the time) Persecutory voices/beliefs?: No Depression: Yes Depression Symptoms: Insomnia;Feeling worthless/self pity Substance abuse history and/or treatment for substance abuse?: Yes Suicide prevention information given to non-admitted patients: Not applicable  Risk to  Others Homicidal Ideation: No Thoughts of Harm to Others: No Current Homicidal Intent: No Current Homicidal Plan: No Access to Homicidal Means: No Identified Victim:  (Na) History of harm to others?: No Assessment of Violence: None Noted Violent Behavior Description: Na Does patient have access to weapons?: No Criminal Charges Pending?: No Does patient have a court date: No  Psychosis Hallucinations: None noted Delusions: None noted  Mental Status Report Appear/Hygiene: Disheveled Eye Contact: Fair Motor Activity: Freedom of movement;Unsteady Speech: Logical/coherent Level of Consciousness: Alert Mood: Depressed Affect: Appropriate to circumstance Anxiety Level: None Thought Processes: Coherent;Relevant Judgement: Unimpaired Orientation: Person;Place;Time;Situation Obsessive Compulsive Thoughts/Behaviors: None  Cognitive Functioning Concentration: Decreased Memory: Recent Intact;Remote Intact IQ: Average Insight: Fair Impulse Control: Fair Appetite: Poor Weight Loss:  (None noted) Weight Gain:  (  None noted) Sleep: Decreased Total Hours of Sleep:  (4-6 hrs/ night) Vegetative Symptoms: None  ADLScreening [facility]) Patient's cognitive ability adequate to safely complete daily activities?: Yes Patient able to express need for assistance with ADLs?: No Independently performs ADLs?: Yes (appropriate for developmental age)  Prior Inpatient Therapy Prior Inpatient Therapy: Yes Prior Therapy Dates: 11/14 Prior Therapy Facilty/Provider(s): [facility] Reason for Treatment: Detox  Prior Outpatient Therapy Prior Outpatient Therapy: No Prior Therapy Dates: Na Prior Therapy Facilty/Provider(s): Na Reason for Treatment: Na  ADL Screening (condition at time of admission) Patient's cognitive ability adequate to safely complete daily activities?: Yes Is the patient deaf or have difficulty hearing?: No Does the patient have difficulty seeing, even when wearing  glasses/contacts?: No Does the patient have difficulty concentrating, remembering, or making decisions?: No Patient able to express need for assistance with ADLs?: No Does the patient have difficulty dressing or bathing?: No Independently performs ADLs?: Yes (appropriate for developmental age) Does the patient have difficulty walking or climbing stairs?: Yes       Abuse/Neglect Assessment (Assessment to be complete while patient is alone) Physical Abuse: Denies Verbal Abuse: Denies Sexual Abuse: Denies Exploitation of patient/patient's resources: Denies Self-Neglect: Denies Values / Beliefs Cultural Requests During Hospitalization: None Spiritual Requests During Hospitalization: None   Advance Directives (For Healthcare) Advance Directive: Patient does not have advance directive;Patient would not like information    Additional Information 1:1 In Past 12 Months?: No CIRT Risk: No Elopement Risk: No Does patient have medical clearance?: Yes     Disposition:  Disposition Initial Assessment Completed for this Encounter: Yes Disposition of Patient: Inpatient treatment program Type of inpatient treatment program: Adult  On Site Evaluation by:   Reviewed with Physician:  Donell Sievert PA  Ardelia Mems M 06/29/2013 5:32 AM

## 2013-06-29 NOTE — Progress Notes (Signed)
Patient is currently unable to be aroused for tele-assessment will attempt again later in shift.

## 2013-06-29 NOTE — ED Provider Notes (Addendum)
Asked by nursing to evaluate patient for  hypertension to 160 systolic. Patient is calm and cooperative. CIWA scores 8 and 9. Heart rate 100. Blood pressure 144/90.  Review of laboratory data yesterday showed anion gap of 19. Will repeat labs today.  Alcohol withdrawal symptoms seem controlled at this point.  BP 148/85  Pulse 104  Temp(Src) 98.6 F (37 C) (Oral)  Resp 20  SpO2 98%  On repeat labs, anion gap is improved to 15. Repeat hemoglobin has improved. Persistently tachycardic in the 1 teens to 120s. CIWA score 10. Behavioral health reluctant to take patient with tachycardia. Will admit medically.   Glynn Octave, MD 06/29/13 1610  Glynn Octave, MD 06/29/13 [phone]

## 2013-06-29 NOTE — Progress Notes (Signed)
Utilization Review completed.  Khrystal Jeanmarie RN CM  

## 2013-06-29 NOTE — H&P (Signed)
[facility] History and Physical  Peter [name] ZOX:[id number] DOB: [birth year] DOA: 06/28/2013  Referring physician: ED physician PCP: No primary provider on file.   Chief Complaint: Altered mental status   HPI:  Pt is 62 yo male with HTN, HLD, stroke, liver cirrhosis secondary to alcohol abuse, COPD who was brought by family member to [facility] ED after found to be more confused at home and was on the floor for unknown period of time. Pt is relatively poor historian and offers very little history so most of the details are provided by ED doctor and available records. There is a report that pt drank fifth of vodka and two 40 ounces beers and has been abusing alcohol daily. He has been admitted to ICU for withdrawal symptoms in the past. [facility] asked to admit for potential alcohol withdrawal. Alcohol level on admission > 100 and ammonia level > 70.  Assessment and Plan:  Active Problems:   Acute alcohol intoxication - repeat alcohol level in blood < 11 - will admit to SDU for further management and will monitor closely for signs of withdrawal - place on CIWA protocol  - place on IVF and provide supportive care as needed   UTI (urinary tract infection) - suggestive per UA - check urine culture  - place on empiric ABX Rocephin    Chronic alcohol abuse - will discuss cessation once pt more medically stable    Liver cirrhosis, alcoholic - LFT's stable for now - repeat ammonia level in AM and check CMET in AM as well    COPD (chronic obstructive pulmonary disease) - clinically compensated - maintaining oxygen saturations at target range    Hyponatremia - continue IVF and repeat BMP in AM   Iron deficiency anemia - Hg and Hct stable and at pt's baseline - CBC in AM   Depression (emotion) - psych consult once pt more medically stable - may need to be discharged to [facility]  Code Status: Full Family Communication: no family at bedside  Disposition Plan: Admit to SDU    Review of  Systems:  Diff cult to obtain due to AMS and pt not offering any history    Past Medical History  Diagnosis Date  . Hypertension   . Hyperlipemia   . Emphysema   . Stroke   . Chronic pain   . Cirrhosis of liver   . Fall   . Dental injury   . Bone spur of other site     Left Foot  . Impaired mobility   . Total self care deficit   . Pneumonia   . Emphysema   . ETOH abuse   . Elevated LFTs   . Arthritis   . Anxiety   . Sinusitis   . Anemia     iron def.  . Hard of hearing   . Diabetes mellitus without complication   . COPD (chronic obstructive pulmonary disease)   . Depression   . Glaucoma   . Emphysema of lung   . Cataract     Past Surgical History  Procedure Laterality Date  . Colonoscopy  01/06/2012    Procedure: COLONOSCOPY;  Surgeon: Theda Belfast, MD;  Location: [facility] ENDOSCOPY;  Service: Endoscopy;  Laterality: N/A;  . Esophagogastroduodenoscopy  01/06/2012    Procedure: ESOPHAGOGASTRODUODENOSCOPY (EGD);  Surgeon: Theda Belfast, MD;  Location: [facility] ENDOSCOPY;  Service: Endoscopy;  Laterality: N/A;  . Esophagogastroduodenoscopy  01/09/2012    Procedure: ESOPHAGOGASTRODUODENOSCOPY (EGD);  Surgeon: Theda Belfast, MD;  Location: [facility] ENDOSCOPY;  Service: Endoscopy;  Laterality: N/A;  . Multiple extractions with alveoloplasty  03/29/2012    Procedure: MULTIPLE EXTRACION WITH ALVEOLOPLASTY;  Surgeon: Georgia Lopes, DDS;  Location: [facility] OR;  Service: Oral Surgery;  Laterality: Bilateral;  . Bone spur  2013    left spur  . Cataract extraction w/ intraocular lens implant    . Mouth surgery    . Colonoscopy      Social History:  reports that he has been smoking Cigarettes.  He has a 60 pack-year smoking history. He has never used smokeless tobacco. He reports that he drinks about 2.4 ounces of alcohol per week. He reports that he does not use illicit drugs.  Allergies  Allergen Reactions  . Poison Ivy Extract [Extract Of Poison Ivy] Rash    Family History  Problem Relation  Age of Onset  . Breast cancer Mother   . Diabetes Maternal Aunt   . Heart disease Sister   . Heart disease Maternal Aunt     Prior to Admission medications   Medication Sig Start Date End Date Taking? Authorizing Provider  acamprosate (CAMPRAL) 333 MG tablet TAKE 2 TABLETS BY MOUTH THREE TIMES A DAY 03/29/13  Yes Dorothea Ogle, MD  atorvastatin (LIPITOR) 80 MG tablet Take 1 tablet (80 mg total) by mouth daily. 02/16/13  Yes Jeanann Lewandowsky, MD  brimonidine (ALPHAGAN) 0.2 % ophthalmic solution Place 1 drop into both eyes 2 (two) times daily.   Yes Historical Provider, MD  diclofenac (CATAFLAM) 50 MG tablet Take 1 tablet (50 mg total) by mouth 3 (three) times daily. Prn back pain. 02/14/13  Yes Linna Hoff, MD  ferrous sulfate 325 (65 FE) MG tablet Take 1 tablet (325 mg total) by mouth daily with breakfast. For low iron. 02/16/13  Yes Jeanann Lewandowsky, MD  folic acid (FOLVITE) 1 MG tablet TAKE 1 TABLET BY MOUTH DAILY 03/26/13  Yes Dorothea Ogle, MD  Multiple Vitamin (MULTIVITAMIN) tablet Take 1 tablet by mouth daily. 02/16/13  Yes Jeanann Lewandowsky, MD  nicotine (NICODERM CQ - DOSED IN MG/24 HR) 7 mg/24hr patch Place 1 patch onto the skin daily. 05/25/12  Yes Nanine Means, NP  pantoprazole (PROTONIX) 40 MG tablet TAKE 1 TABLET BY MOUTH EVERY DAY 05/27/13  Yes Dorothea Ogle, MD  PROAIR HFA 108 [phone] BASE) MCG/ACT inhaler INHALE 2 PUFFS BY MOUTH 4 TIMES DAILY 03/26/13  Yes Dorothea Ogle, MD  propranolol (INDERAL) 10 MG tablet Take 1 tablet (10 mg total) by mouth 3 (three) times daily. 06/07/13  Yes Leroy Sea, MD  thiamine (VITAMIN B-1) 100 MG tablet Take 1 tablet (100 mg total) by mouth daily. For vitamin B deficiency. 02/16/13  Yes Jeanann Lewandowsky, MD  traZODone (DESYREL) 50 MG tablet Take 1 tablet (50 mg total) by mouth at bedtime. 02/16/13  Yes Jeanann Lewandowsky, MD  vitamin B-12 (CYANOCOBALAMIN) 100 MCG tablet Take 1 tablet (100 mcg total) by mouth daily. For low B12. 02/16/13  Yes Jeanann Lewandowsky, MD  vitamin E 200 UNIT capsule Take 1 capsule (200 Units total) by mouth daily. 02/16/13  Yes Jeanann Lewandowsky, MD  budesonide-formoterol (SYMBICORT) 80-4.5 MCG/ACT inhaler Inhale 2 puffs into the lungs 2 (two) times daily. For COPD. 02/04/12 04/26/13  Verne Spurr, PA-C    Physical Exam: Filed Vitals:   06/29/13 0830 06/29/13 1100 06/29/13 1130 06/29/13 1200  BP: 156/95 133/77 128/75 139/87  Pulse: 111 114 111 107  Temp:      TempSrc:  Resp: 19  17   SpO2: 99% 100% 99% 98%    Physical Exam  Constitutional: Appears calm and in no distress.  HENT: Normocephalic. External right and left ear normal. Dry MM  Eyes: Conjunctivae and EOM are normal. PERRLA, no scleral icterus.  Neck: Normal ROM. Neck supple. No JVD. No tracheal deviation. No thyromegaly.  CVS: Regular rhythm, tachycardic, S1/S2 +, no murmurs, no gallops, no carotid bruit.  Pulmonary: Effort and breath sounds normal, no stridor, rhonchi, wheezes, rales.  Abdominal: Soft. BS +,  no distension, tenderness, rebound or guarding.  Musculoskeletal: Normal range of motion. No edema and no tenderness.  Lymphadenopathy: No lymphadenopathy noted, cervical, inguinal. Neuro: Alert. Normal reflexes, muscle tone coordination. Tremors at rest  Skin: Skin is warm and dry. No rash noted. Not diaphoretic. No erythema. No pallor.  Psychiatric: Flat affect, minimally verbal   Labs on Admission:  Basic Metabolic Panel:  Recent Labs Lab 06/28/13 1540 06/29/13 0810  NA 130* 140  K 3.7 3.7  CL 96 103  CO2 15* 22  GLUCOSE 94 87  BUN 8 7  CREATININE 0.72 0.73  CALCIUM 8.9 9.2   Liver Function Tests:  Recent Labs Lab 06/28/13 1540 06/29/13 0810  AST 28 27  ALT 19 18  ALKPHOS 91 95  BILITOT 0.2* 0.4  PROT 7.8 8.8*  ALBUMIN 3.3* 3.7    Recent Labs Lab 06/29/13 0810  AMMONIA 73*   CBC:  Recent Labs Lab 06/28/13 1540 06/29/13 0810  WBC 4.2 5.6  NEUTROABS  --  3.6  HGB 8.3* 9.7*  HCT 24.7* 28.7*  MCV  80.2 80.2  PLT 281 345   Cardiac Enzymes:  Recent Labs Lab 06/28/13 1540 06/29/13 0810  CKTOTAL 137 126  TROPONINI  --  <0.30   Radiological Exams on Admission: Ct Head Wo Contrast  06/29/2013   CLINICAL DATA:  Syncope  EXAM: CT HEAD WITHOUT CONTRAST  TECHNIQUE: Contiguous axial images were obtained from the base of the skull through the vertex without intravenous contrast. Study was obtained within 24 hr of patient's arrival at the emergency department.  COMPARISON:  April 06, 2013  FINDINGS: There is mild diffuse atrophy, stable. There is no demonstrable mass, hemorrhage, extra-axial fluid collection, or midline shift. Mild patchy small vessel disease in the centra semiovale bilaterally is stable. No new gray-white compartment lesion apparent. No apparent acute infarct.  Bony calvarium appears intact. The mastoid air cells are clear. There is ethmoid sinus disease bilaterally, more on the left than on the right.  IMPRESSION: Mild atrophy and mild periventricular small vessel disease. There is no intracranial mass, hemorrhage, or acute appearing infarct. There is ethmoid sinus disease bilaterally, more on the left than on the right.   Electronically Signed   By: Bretta Bang M.D.   On: 06/29/2013 09:01    EKG: Normal sinus rhythm, no ST/T wave changes  Debbora Presto, MD  [facility] Pager [phone]  If 7PM-7AM, please contact night-coverage [email or web address] Password [facility] 06/29/2013, 12:51 PM

## 2013-06-29 NOTE — Consult Note (Signed)
Note reviewed and agreed with  

## 2013-06-29 NOTE — Consult Note (Signed)
[facility] Face-to-Face Psychiatry Consult   Reason for Consult:  Alcohol detox and depression Referring Physician:  EDP  Peter [name] is an 62 y.o. male.  Assessment: AXIS I:  Alcohol Abuse and Depressive Disorder NOS AXIS II:  Deferred AXIS III:   Past Medical History  Diagnosis Date  . Hypertension   . Hyperlipemia   . Emphysema   . Stroke   . Chronic pain   . Cirrhosis of liver   . Fall   . Dental injury   . Bone spur of other site     Left Foot  . Impaired mobility   . Total self care deficit   . Pneumonia   . Emphysema   . ETOH abuse   . Elevated LFTs   . Arthritis   . Anxiety   . Sinusitis   . Anemia     iron def.  . Hard of hearing   . Diabetes mellitus without complication   . COPD (chronic obstructive pulmonary disease)   . Depression   . Glaucoma   . Emphysema of lung   . Cataract    AXIS IV:  other psychosocial or environmental problems and problems related to social environment AXIS V:  41-50 serious symptoms  Plan:  Recommend psychiatric Inpatient admission when medically cleared.  Subjective:   Peter [name] is a 62 y.o. male patient admitted with Alcohol detox and Depressive Disorder.  HPI:  Patient states that he would like to detox.  "I'm ready to stop and get my life together."  Patient states that he is also depressed.  Patient states that he went through detox 2-3 yrs ago and was sober for 2 months.  States that he drinks daily at least 2 forty ounce beers.  Patient lives with his brother in [place].  Patient states that he has never had medication or outpatient services related to his depression or drinking.   HPI Elements:   Location:  [facility] ED. Quality:  Affecting patient mentally and physically. Severity:  Depression and Alcohol abuse.  Past Psychiatric History: Past Medical History  Diagnosis Date  . Hypertension   . Hyperlipemia   . Emphysema   . Stroke   . Chronic pain   . Cirrhosis of liver   . Fall   . Dental  injury   . Bone spur of other site     Left Foot  . Impaired mobility   . Total self care deficit   . Pneumonia   . Emphysema   . ETOH abuse   . Elevated LFTs   . Arthritis   . Anxiety   . Sinusitis   . Anemia     iron def.  . Hard of hearing   . Diabetes mellitus without complication   . COPD (chronic obstructive pulmonary disease)   . Depression   . Glaucoma   . Emphysema of lung   . Cataract     reports that he has been smoking Cigarettes.  He has a 60 pack-year smoking history. He has never used smokeless tobacco. He reports that he drinks about 2.4 ounces of alcohol per week. He reports that he does not use illicit drugs. Family History  Problem Relation Age of Onset  . Breast cancer Mother   . Diabetes Maternal Aunt   . Heart disease Sister   . Heart disease Maternal Aunt    Family History Substance Abuse: Yes, Describe: Family Supports: Yes, List: Living Arrangements: Alone Can pt return to current  living arrangement?: Yes Abuse/Neglect [facility]) Physical Abuse: Denies Verbal Abuse: Denies Sexual Abuse: Denies Allergies:   Allergies  Allergen Reactions  . Poison Ivy Extract [employer] Of Poison Ivy] Rash    ACT Assessment Complete:  Yes:    Educational Status    Risk to Self: Risk to self Suicidal Ideation: No Suicidal Intent: No Is patient at risk for suicide?: No Suicidal Plan?: No Access to Means: No What has been your use of drugs/alcohol within the last 12 months?: Daily Previous Attempts/Gestures: No How many times?:  (None) Other Self Harm Risks:  (None) Triggers for Past Attempts: None known Intentional Self Injurious Behavior: None Family Suicide History: No Recent stressful life event(s): Recent negative physical changes (Feels bad all the time) Persecutory voices/beliefs?: No Depression: Yes Depression Symptoms: Insomnia;Feeling worthless/self pity Substance abuse history and/or treatment for substance abuse?: Yes Suicide prevention  information given to non-admitted patients: Not applicable  Risk to Others: Risk to Others Homicidal Ideation: No Thoughts of Harm to Others: No Current Homicidal Intent: No Current Homicidal Plan: No Access to Homicidal Means: No Identified Victim:  (Na) History of harm to others?: No Assessment of Violence: None Noted Violent Behavior Description: Na Does patient have access to weapons?: No Criminal Charges Pending?: No Does patient have a court date: No  Abuse: Abuse/Neglect Assessment (Assessment to be complete while patient is alone) Physical Abuse: Denies Verbal Abuse: Denies Sexual Abuse: Denies Exploitation of patient/patient's resources: Denies Self-Neglect: Denies  Prior Inpatient Therapy: Prior Inpatient Therapy Prior Inpatient Therapy: Yes Prior Therapy Dates: 11/14 Prior Therapy Facilty/Provider(s): [facility] Reason for Treatment: Detox  Prior Outpatient Therapy: Prior Outpatient Therapy Prior Outpatient Therapy: No Prior Therapy Dates: Na Prior Therapy Facilty/Provider(s): Na Reason for Treatment: Na  Additional Information: Additional Information 1:1 In Past 12 Months?: No CIRT Risk: No Elopement Risk: No Does patient have medical clearance?: Yes                  Objective: Blood pressure 148/85, pulse 104, temperature 98.6 F (37 C), temperature source Oral, resp. rate 20, SpO2 98.00%.There is no weight on file to calculate BMI. Results for orders placed during the hospital encounter of 06/28/13 (from the past 72 hour(s))  ACETAMINOPHEN LEVEL     Status: None   Collection Time    06/28/13  3:40 PM      Result Value Range   Acetaminophen (Tylenol), Serum <15.0  10 - 30 ug/mL   Comment:            THERAPEUTIC CONCENTRATIONS VARY     SIGNIFICANTLY. A RANGE OF 10-30     ug/mL MAY BE AN EFFECTIVE     CONCENTRATION FOR MANY PATIENTS.     HOWEVER, SOME ARE BEST TREATED     AT CONCENTRATIONS OUTSIDE THIS     RANGE.     ACETAMINOPHEN CONCENTRATIONS      >150 ug/mL AT 4 HOURS AFTER     INGESTION AND >50 ug/mL AT 12     HOURS AFTER INGESTION ARE     OFTEN ASSOCIATED WITH TOXIC     REACTIONS.  CBC     Status: Abnormal   Collection Time    06/28/13  3:40 PM      Result Value Range   WBC 4.2  4.0 - 10.5 K/uL   RBC 3.08 (*) 4.22 - 5.81 MIL/uL   Hemoglobin 8.3 (*) 13.0 - 17.0 g/dL   HCT 16.1 (*) 09.6 - 04.5 %   MCV 80.2  78.0 -  100.0 fL   MCH 26.9  26.0 - 34.0 pg   MCHC 33.6  30.0 - 36.0 g/dL   RDW 16.1 (*) 09.6 - 04.5 %   Platelets 281  150 - 400 K/uL  COMPREHENSIVE METABOLIC PANEL     Status: Abnormal   Collection Time    06/28/13  3:40 PM      Result Value Range   Sodium 130 (*) 135 - 145 mEq/L   Potassium 3.7  3.5 - 5.1 mEq/L   Chloride 96  96 - 112 mEq/L   CO2 15 (*) 19 - 32 mEq/L   Glucose, Bld 94  70 - 99 mg/dL   BUN 8  6 - 23 mg/dL   Creatinine, Ser 4.09  0.50 - 1.35 mg/dL   Calcium 8.9  8.4 - 81.1 mg/dL   Total Protein 7.8  6.0 - 8.3 g/dL   Albumin 3.3 (*) 3.5 - 5.2 g/dL   AST 28  0 - 37 U/L   ALT 19  0 - 53 U/L   Alkaline Phosphatase 91  39 - 117 U/L   Total Bilirubin 0.2 (*) 0.3 - 1.2 mg/dL   GFR calc non Af Amer >90  >90 mL/min   GFR calc Af Amer >90  >90 mL/min   Comment: (NOTE)     The eGFR has been calculated using the CKD EPI equation.     This calculation has not been validated in all clinical situations.     eGFR's persistently <90 mL/min signify possible Chronic Kidney     Disease.  ETHANOL     Status: Abnormal   Collection Time    06/28/13  3:40 PM      Result Value Range   Alcohol, Ethyl (B) 477 (*) 0 - 11 mg/dL   Comment:            LOWEST DETECTABLE LIMIT FOR     SERUM ALCOHOL IS 11 mg/dL     FOR MEDICAL PURPOSES ONLY     CRITICAL RESULT CALLED TO, READ BACK BY AND VERIFIED WITH:     HALL,K. RN @1630  ON 06/18/13 BY MCCOY,N.      CORRECT DATE OF CALL 06/28/13  SALICYLATE LEVEL     Status: Abnormal   Collection Time    06/28/13  3:40 PM      Result Value Range   Salicylate Lvl <2.0 (*)  2.8 - 20.0 mg/dL  CK     Status: None   Collection Time    06/28/13  3:40 PM      Result Value Range   Total CK 137  7 - 232 U/L  URINE RAPID DRUG SCREEN (HOSP PERFORMED)     Status: None   Collection Time    06/28/13  4:27 PM      Result Value Range   Opiates NONE DETECTED  NONE DETECTED   Cocaine NONE DETECTED  NONE DETECTED   Benzodiazepines NONE DETECTED  NONE DETECTED   Amphetamines NONE DETECTED  NONE DETECTED   Tetrahydrocannabinol NONE DETECTED  NONE DETECTED   Barbiturates NONE DETECTED  NONE DETECTED   Comment:            DRUG SCREEN FOR MEDICAL PURPOSES     ONLY.  IF CONFIRMATION IS NEEDED     FOR ANY PURPOSE, NOTIFY LAB     WITHIN 5 DAYS.                LOWEST DETECTABLE LIMITS     FOR  URINE DRUG SCREEN     Drug Class       Cutoff (ng/mL)     Amphetamine      1000     Barbiturate      200     Benzodiazepine   200     Tricyclics       300     Opiates          300     Cocaine          300     THC              50  ETHANOL     Status: Abnormal   Collection Time    06/29/13  2:32 AM      Result Value Range   Alcohol, Ethyl (B) 128 (*) 0 - 11 mg/dL   Comment:            LOWEST DETECTABLE LIMIT FOR     SERUM ALCOHOL IS 11 mg/dL     FOR MEDICAL PURPOSES ONLY  CBC WITH DIFFERENTIAL     Status: Abnormal   Collection Time    06/29/13  8:10 AM      Result Value Range   WBC 5.6  4.0 - 10.5 K/uL   RBC 3.58 (*) 4.22 - 5.81 MIL/uL   Hemoglobin 9.7 (*) 13.0 - 17.0 g/dL   HCT 16.1 (*) 09.6 - 04.5 %   MCV 80.2  78.0 - 100.0 fL   MCH 27.1  26.0 - 34.0 pg   MCHC 33.8  30.0 - 36.0 g/dL   RDW 40.9 (*) 81.1 - 91.4 %   Platelets 345  150 - 400 K/uL   Neutrophils Relative % 64  43 - 77 %   Neutro Abs 3.6  1.7 - 7.7 K/uL   Lymphocytes Relative 19  12 - 46 %   Lymphs Abs 1.1  0.7 - 4.0 K/uL   Monocytes Relative 16 (*) 3 - 12 %   Monocytes Absolute 0.9  0.1 - 1.0 K/uL   Eosinophils Relative 0  0 - 5 %   Eosinophils Absolute 0.0  0.0 - 0.7 K/uL   Basophils Relative 0  0 -  1 %   Basophils Absolute 0.0  0.0 - 0.1 K/uL  COMPREHENSIVE METABOLIC PANEL     Status: Abnormal   Collection Time    06/29/13  8:10 AM      Result Value Range   Sodium 140  135 - 145 mEq/L   Comment: DELTA CHECK NOTED     REPEATED TO VERIFY   Potassium 3.7  3.5 - 5.1 mEq/L   Chloride 103  96 - 112 mEq/L   CO2 22  19 - 32 mEq/L   Glucose, Bld 87  70 - 99 mg/dL   BUN 7  6 - 23 mg/dL   Creatinine, Ser 7.82  0.50 - 1.35 mg/dL   Calcium 9.2  8.4 - 95.6 mg/dL   Total Protein 8.8 (*) 6.0 - 8.3 g/dL   Albumin 3.7  3.5 - 5.2 g/dL   AST 27  0 - 37 U/L   ALT 18  0 - 53 U/L   Alkaline Phosphatase 95  39 - 117 U/L   Total Bilirubin 0.4  0.3 - 1.2 mg/dL   GFR calc non Af Amer >90  >90 mL/min   GFR calc Af Amer >90  >90 mL/min   Comment: (NOTE)     The eGFR has been calculated  using the CKD EPI equation.     This calculation has not been validated in all clinical situations.     eGFR's persistently <90 mL/min signify possible Chronic Kidney     Disease.  CK     Status: None   Collection Time    06/29/13  8:10 AM      Result Value Range   Total CK 126  7 - 232 U/L  TROPONIN I     Status: None   Collection Time    06/29/13  8:10 AM      Result Value Range   Troponin I <0.30  <0.30 ng/mL   Comment:            Due to the release kinetics of cTnI,     a negative result within the first hours     of the onset of symptoms does not rule out     myocardial infarction with certainty.     If myocardial infarction is still suspected,     repeat the test at appropriate intervals.  ACETAMINOPHEN LEVEL     Status: None   Collection Time    06/29/13  8:10 AM      Result Value Range   Acetaminophen (Tylenol), Serum <15.0  10 - 30 ug/mL   Comment:            THERAPEUTIC CONCENTRATIONS VARY     SIGNIFICANTLY. A RANGE OF 10-30     ug/mL MAY BE AN EFFECTIVE     CONCENTRATION FOR MANY PATIENTS.     HOWEVER, SOME ARE BEST TREATED     AT CONCENTRATIONS OUTSIDE THIS     RANGE.     ACETAMINOPHEN  CONCENTRATIONS     >150 ug/mL AT 4 HOURS AFTER     INGESTION AND >50 ug/mL AT 12     HOURS AFTER INGESTION ARE     OFTEN ASSOCIATED WITH TOXIC     REACTIONS.  SALICYLATE LEVEL     Status: Abnormal   Collection Time    06/29/13  8:10 AM      Result Value Range   Salicylate Lvl <2.0 (*) 2.8 - 20.0 mg/dL  ETHANOL     Status: None   Collection Time    06/29/13  8:10 AM      Result Value Range   Alcohol, Ethyl (B) <11  0 - 11 mg/dL   Comment:            LOWEST DETECTABLE LIMIT FOR     SERUM ALCOHOL IS 11 mg/dL     FOR MEDICAL PURPOSES ONLY  AMMONIA     Status: Abnormal   Collection Time    06/29/13  8:10 AM      Result Value Range   Ammonia 73 (*) 11 - 60 umol/L  URINALYSIS, ROUTINE W REFLEX MICROSCOPIC     Status: Abnormal   Collection Time    06/29/13  8:33 AM      Result Value Range   Color, Urine YELLOW  YELLOW   APPearance CLEAR  CLEAR   Specific Gravity, Urine 1.007  1.005 - 1.030   pH 6.5  5.0 - 8.0   Glucose, UA NEGATIVE  NEGATIVE mg/dL   Hgb urine dipstick NEGATIVE  NEGATIVE   Bilirubin Urine NEGATIVE  NEGATIVE   Ketones, ur NEGATIVE  NEGATIVE mg/dL   Protein, ur NEGATIVE  NEGATIVE mg/dL   Urobilinogen, UA 0.2  0.0 - 1.0 mg/dL   Nitrite NEGATIVE  NEGATIVE  Leukocytes, UA LARGE (*) NEGATIVE  URINE MICROSCOPIC-ADD ON     Status: Abnormal   Collection Time    06/29/13  8:33 AM      Result Value Range   Squamous Epithelial / LPF RARE  RARE   WBC, UA 11-20  <3 WBC/hpf   RBC / HPF 3-6  <3 RBC/hpf   Bacteria, UA FEW (*) RARE  CG4 I-STAT (LACTIC ACID)     Status: None   Collection Time    06/29/13  8:56 AM      Result Value Range   Lactic Acid, Venous 1.18  0.5 - 2.2 mmol/L     Current Facility-Administered Medications  Medication Dose Route Frequency Provider Last Rate Last Dose  . acetaminophen (TYLENOL) tablet 650 mg  650 mg Oral Q4H PRN Mora Bellman, PA-C      . alum & mag hydroxide-simeth (MAALOX/MYLANTA) 200-200-20 MG/5ML suspension 30 mL  30 mL Oral  PRN Mora Bellman, PA-C      . ibuprofen (ADVIL,MOTRIN) tablet 600 mg  600 mg Oral Q8H PRN Mora Bellman, PA-C      . LORazepam (ATIVAN) injection 0-4 mg  0-4 mg Intravenous Q6H Mora Bellman, PA-C   1 mg at 06/29/13 4132   Followed by  . [START ON 06/30/2013] LORazepam (ATIVAN) injection 0-4 mg  0-4 mg Intravenous Q12H Mora Bellman, PA-C      . LORazepam (ATIVAN) tablet 0-4 mg  0-4 mg Oral Q6H Mora Bellman, PA-C       Followed by  . [START ON 06/30/2013] LORazepam (ATIVAN) tablet 0-4 mg  0-4 mg Oral Q12H Mora Bellman, PA-C      . LORazepam (ATIVAN) tablet 1 mg  1 mg Oral Q8H PRN Mora Bellman, PA-C      . nicotine (NICODERM CQ - dosed in mg/24 hours) patch 21 mg  21 mg Transdermal Daily Mora Bellman, PA-C      . ondansetron [facility]) tablet 4 mg  4 mg Oral Q8H PRN Mora Bellman, PA-C      . thiamine (VITAMIN B-1) tablet 100 mg  100 mg Oral Daily Mora Bellman, PA-C       Or  . thiamine (B-1) injection 100 mg  100 mg Intravenous Daily Hannah S Merrell, PA-C      . zolpidem (AMBIEN) tablet 5 mg  5 mg Oral QHS PRN Mora Bellman, PA-C       Current Outpatient Prescriptions  Medication Sig Dispense Refill  . acamprosate (CAMPRAL) 333 MG tablet TAKE 2 TABLETS BY MOUTH THREE TIMES A DAY  180 tablet  11  . atorvastatin (LIPITOR) 80 MG tablet Take 1 tablet (80 mg total) by mouth daily.  30 tablet  2  . brimonidine (ALPHAGAN) 0.2 % ophthalmic solution Place 1 drop into both eyes 2 (two) times daily.      . diclofenac (CATAFLAM) 50 MG tablet Take 1 tablet (50 mg total) by mouth 3 (three) times daily. Prn back pain.  15 tablet  0  . ferrous sulfate 325 (65 FE) MG tablet Take 1 tablet (325 mg total) by mouth daily with breakfast. For low iron.  30 tablet  2  . folic acid (FOLVITE) 1 MG tablet TAKE 1 TABLET BY MOUTH DAILY  30 tablet  7  . Multiple Vitamin (MULTIVITAMIN) tablet Take 1 tablet by mouth daily.  30 tablet  0  . nicotine (NICODERM CQ - DOSED IN MG/24 HR) 7  mg/24hr patch Place 1 patch onto the skin daily.  28 patch  0  . pantoprazole (PROTONIX) 40 MG tablet TAKE 1 TABLET BY MOUTH EVERY DAY  30 tablet  0  . PROAIR HFA 108 (90 BASE) MCG/ACT inhaler INHALE 2 PUFFS BY MOUTH 4 TIMES DAILY  1 Inhaler  11  . propranolol (INDERAL) 10 MG tablet Take 1 tablet (10 mg total) by mouth 3 (three) times daily.  90 tablet  3  . thiamine (VITAMIN B-1) 100 MG tablet Take 1 tablet (100 mg total) by mouth daily. For vitamin B deficiency.  30 tablet  2  . traZODone (DESYREL) 50 MG tablet Take 1 tablet (50 mg total) by mouth at bedtime.  30 tablet  0  . vitamin B-12 (CYANOCOBALAMIN) 100 MCG tablet Take 1 tablet (100 mcg total) by mouth daily. For low B12.  30 tablet  2  . vitamin E 200 UNIT capsule Take 1 capsule (200 Units total) by mouth daily.  7 capsule  0  . budesonide-formoterol (SYMBICORT) 80-4.5 MCG/ACT inhaler Inhale 2 puffs into the lungs 2 (two) times daily. For COPD.        Psychiatric Specialty Exam:     Blood pressure 148/85, pulse 104, temperature 98.6 F (37 C), temperature source Oral, resp. rate 20, SpO2 98.00%.There is no weight on file to calculate BMI.  General Appearance: Disheveled  Eye Contact::  Good  Speech:  Clear and Coherent and Normal Rate  Volume:  Normal  Mood:  Anxious and Depressed  Affect:  Congruent and Depressed  Thought Process:  Circumstantial and Goal Directed  Orientation:  Full (Time, Place, and Person)  Thought Content:  "I want to do better"  Suicidal Thoughts:  No  Homicidal Thoughts:  No  Memory:  Immediate;   Good Recent;   Good  Judgement:  Poor  Insight:  Lacking  Psychomotor Activity:  Tremor  Concentration:  Fair  Recall:  Good  Akathisia:  No  Handed:  Right  AIMS (if indicated):     Assets:  Desire for Improvement Housing  Sleep:      Face to face consult/interview with Dr. Ladona Ridgel  Treatment Plan Summary: Daily contact with patient to assess and evaluate symptoms and progress in  treatment Medication management  Disposition:  Inpatient treatment detox.  Start home medications and Librium protocol.  Monitor for safety and stabilization until inpatient bed is found.  Debarah Mccumbers. FNP_BC 06/29/2013 9:58 AM

## 2013-06-30 DIAGNOSIS — F329 Major depressive disorder, single episode, unspecified: Secondary | ICD-10-CM

## 2013-06-30 LAB — CBC
HCT: 24.1 % — ABNORMAL LOW (ref 39.0–52.0)
Hemoglobin: 8.2 g/dL — ABNORMAL LOW (ref 13.0–17.0)
MCH: 27.2 pg (ref 26.0–34.0)
MCHC: 34 g/dL (ref 30.0–36.0)
MCV: 80.1 fL (ref 78.0–100.0)
Platelets: 289 10*3/uL (ref 150–400)
RBC: 3.01 MIL/uL — ABNORMAL LOW (ref 4.22–5.81)
RDW: 18.2 % — ABNORMAL HIGH (ref 11.5–15.5)
WBC: 4.3 10*3/uL (ref 4.0–10.5)

## 2013-06-30 LAB — COMPREHENSIVE METABOLIC PANEL
AST: 17 U/L (ref 0–37)
BUN: 12 mg/dL (ref 6–23)
CO2: 22 mEq/L (ref 19–32)
Chloride: 103 mEq/L (ref 96–112)
Creatinine, Ser: 0.97 mg/dL (ref 0.50–1.35)
GFR calc Af Amer: >90 mL/min (ref >90)
GFR calc non Af Amer: 88 mL/min — ABNORMAL LOW (ref >90)
Total Bilirubin: 0.3 mg/dL (ref 0.3–1.2)

## 2013-06-30 LAB — AMMONIA: Ammonia: 40 umol/L (ref 11–60)

## 2013-06-30 MED ORDER — POTASSIUM CHLORIDE CRYS ER 20 MEQ PO TBCR
40.0000 meq | EXTENDED_RELEASE_TABLET | Freq: Once | ORAL | Status: AC
  Administered 2013-06-30: 40 meq via ORAL
  Filled 2013-06-30: qty 2

## 2013-06-30 MED ORDER — MAGNESIUM SULFATE 40 MG/ML IJ SOLN
2.0000 g | Freq: Once | INTRAMUSCULAR | Status: AC
  Administered 2013-06-30: 2 g via INTRAVENOUS
  Filled 2013-06-30: qty 50

## 2013-06-30 NOTE — Progress Notes (Signed)
Patient ID: LAVELLE AKEL, male   DOB: 1951-02-03, 62 y.o.   MRN: [id number] [facility] PROGRESS NOTE  ROBEL WUERTZ WUJ:[id number] DOB: 1951/02/25 DOA: 06/28/2013 PCP: No primary provider on file.  Brief narrative: Pt is 62 yo male with HTN, HLD, stroke, liver cirrhosis secondary to alcohol abuse, COPD who was brought by family member to [facility] ED after found to be more confused at home and was on the floor for unknown period of time. Pt is relatively poor historian and offers very little history so most of the details are provided by ED doctor and available records. There is a report that pt drank fifth of vodka and two 40 ounces beers and has been abusing alcohol daily. He has been admitted to ICU for withdrawal symptoms in the past. [facility] asked to admit for potential alcohol withdrawal. Alcohol level on admission > 100 and ammonia level > 70.   Assessment and Plan:  Active Problems:  Acute alcohol intoxication  - repeat alcohol level in blood < 11  - no signs of withdrawal over the past 24 hours, plan on transferring to regular medical bed  - keep on CIWA protocol  - pt tolerating current diet well  UTI (urinary tract infection)  - suggestive per UA  - urine culture pending  - continue Rocephin day #2 Chronic alcohol abuse  - cessation discussed in detail  Liver cirrhosis, alcoholic  - LFT's stable for now  - ammonia level is within normal limits and LFT also WNL this AM COPD (chronic obstructive pulmonary disease)  - clinically compensated  - maintaining oxygen saturations at target range  Hyponatremia  - sodium is WNL this AM, d/c IVF and repeat BMP in AM Iron deficiency anemia  - Hg and Hct slightly down over 24 hours but could be dilutional - no signs of active bleed  - CBC in AM  Depression (emotion)  - psych consult once pt more medically stable  - may need to be discharged to [facility]  Hypokalemia - will supplement and repeat BMP in AM - Mg is 1.5, which is on the low  end of normal, will give 2 gm of Mg via IV route   Code Status: Full  Family Communication: no family at bedside  Disposition Plan: Admit to SDU   Consultants:  Psych  Procedures/Studies: Ct Head Wo Contrast   06/29/2013   Mild atrophy and mild periventricular small vessel disease. There is no intracranial mass, hemorrhage, or acute appearing infarct. There is ethmoid sinus disease bilaterally, more on the left than on the right.     Antibiotics:  Rocephin 12/24 -->   Code Status: Full Family Communication: Pt at bedside Disposition Plan: Home when medically stable  HPI/Subjective: No events overnight.   Objective: Filed Vitals:   06/30/13 0100 06/30/13 0300 06/30/13 0500 06/30/13 0700  BP: 111/72 141/78 130/90 161/74  Pulse: 80 73 73 74  Temp:      TempSrc:      Resp: 16 14 17 16   SpO2: 99% 100% 100% 99%    Intake/Output Summary (Last 24 hours) at 06/30/13 0725 Last data filed at 06/29/13 2300  Gross per 24 hour  Intake    450 ml  Output   2301 ml  Net  -1851 ml    Exam:   General:  Pt is alert, follows commands appropriately, not in acute distress  Cardiovascular: Regular rate and rhythm, S1/S2, no murmurs, no rubs, no gallops  Respiratory: Clear to auscultation bilaterally,  no wheezing, no crackles, no rhonchi  Abdomen: Soft, non tender, non distended, bowel sounds present, no guarding  Extremities: No edema, pulses DP and PT palpable bilaterally  Neuro: Grossly nonfocal  Data Reviewed: Basic Metabolic Panel:  Recent Labs Lab 06/28/13 1540 06/29/13 0810 06/29/13 1551 06/30/13 0340  NA 130* 140  --  137  K 3.7 3.7  --  3.2*  CL 96 103  --  103  CO2 15* 22  --  22  GLUCOSE 94 87  --  99  BUN 8 7  --  12  CREATININE 0.72 0.73  --  0.97  CALCIUM 8.9 9.2  --  8.8  MG  --   --  1.5  --   PHOS  --   --  2.6  --    Liver Function Tests:  Recent Labs Lab 06/28/13 1540 06/29/13 0810 06/30/13 0340  AST 28 27 17   ALT 19 18 13   ALKPHOS 91  95 70  BILITOT 0.2* 0.4 0.3  PROT 7.8 8.8* 6.6  ALBUMIN 3.3* 3.7 2.8*    Recent Labs Lab 06/29/13 0810 06/30/13 0340  AMMONIA 73* 40   CBC:  Recent Labs Lab 06/28/13 1540 06/29/13 0810 06/30/13 0340  WBC 4.2 5.6 4.3  NEUTROABS  --  3.6  --   HGB 8.3* 9.7* 8.2*  HCT 24.7* 28.7* 24.1*  MCV 80.2 80.2 80.1  PLT 281 345 289   Cardiac Enzymes:  Recent Labs Lab 06/28/13 1540 06/29/13 0810  CKTOTAL 137 126  TROPONINI  --  <0.30   Recent Results (from the past 240 hour(s))  MRSA PCR SCREENING     Status: None   Collection Time    06/29/13  2:50 PM      Result Value Range Status   MRSA by PCR NEGATIVE  NEGATIVE Final   Comment:            The GeneXpert MRSA Assay (FDA     approved for NASAL specimens     only), is one component of a     comprehensive MRSA colonization     surveillance program. It is not     intended to diagnose MRSA     infection nor to guide or     monitor treatment for     MRSA infections.     Scheduled Meds: . acamprosate  333 mg Oral TID  . albuterol  2 puff Inhalation QID  . atorvastatin  80 mg Oral Daily  . brimonidine  1 drop Both Eyes BID  . budesonide-formoterol  2 puff Inhalation BID  . cefTRIAXone (ROCEPHIN)  IV  1 g Intravenous Q24H  . chlordiazePOXIDE  25 mg Oral TID   Followed by  . [START ON 07/01/2013] chlordiazePOXIDE  25 mg Oral BH-qamhs   Followed by  . [START ON 07/02/2013] chlordiazePOXIDE  25 mg Oral Daily  . diclofenac  50 mg Oral TID  . enoxaparin (LOVENOX) injection  40 mg Subcutaneous Q24H  . ferrous sulfate  325 mg Oral Q breakfast  . folic acid  1 mg Oral Daily  . multivitamin with minerals  1 tablet Oral Daily  . nicotine  21 mg Transdermal Daily  . pantoprazole  40 mg Oral Daily  . propranolol  10 mg Oral TID  . thiamine  100 mg Oral Daily   Or  . thiamine  100 mg Intravenous Daily  . traZODone  50 mg Oral QHS  . vitamin E  200 Units Oral Daily  Continuous Infusions: . sodium chloride 50 mL/hr at  06/29/13 1528   Debbora Presto, MD  [facility] Pager [phone]  If 7PM-7AM, please contact night-coverage [email or web address] Password [email or web address] 06/30/2013, 7:25 AM   LOS: 2 days

## 2013-06-30 NOTE — Consult Note (Signed)
[facility] Face-to-Face Psychiatry Consult   Reason for Consult:  Alcohol abuse and depression Referring Physician:  [name] is an 62 y.o. male.  Assessment: AXIS I:  Depressive Disorder NOS and Alcohol abuse AXIS II:  Deferred AXIS III:   Past Medical History  Diagnosis Date  . Hypertension   . Hyperlipemia   . Emphysema   . Stroke   . Chronic pain   . Cirrhosis of liver   . Fall   . Dental injury   . Bone spur of other site     Left Foot  . Impaired mobility   . Total self care deficit   . Pneumonia   . Emphysema   . ETOH abuse   . Elevated LFTs   . Arthritis   . Anxiety   . Sinusitis   . Anemia     iron def.  . Hard of hearing   . Diabetes mellitus without complication   . COPD (chronic obstructive pulmonary disease)   . Depression   . Glaucoma   . Emphysema of lung   . Cataract    AXIS IV:  economic problems, other psychosocial or environmental problems, problems related to social environment and problems with primary support group AXIS V:  51-60 moderate symptoms  Plan:  No evidence of imminent risk to self or others at present.   Patient does not meet criteria for psychiatric inpatient admission. Supportive therapy provided about ongoing stressors. Discussed crisis plan, support from social network, calling 911, coming to the Emergency Department, and calling Suicide Hotline.  Subjective:   Peter [name] is a 62 y.o. male patient admitted with confusion and alcohol abuse.  His blood alcohol level was very high..  HPI:  Patient seen chart reviewed.  Patient is 62 year old African American male who has a long history of alcohol abuse and dependence, recently admitted to the medical floor because of confusion and he was intoxicated.  Consult was called because patient endorses depression.  Patient is a poor historian however he was relevant and conversation.  Patient admitted depressive thoughts but denies any suicidal thoughts or homicidal  thoughts.  He admitted drinking alcohol on a daily basis but does not feel that he needs treatment.  He is living with his brother.  He endorse financial distress and limited support system.  He is not working for more than 3 years.  He has multiple health issues.  He is concerned about his physical health.  He admitted to poor sleep and sometimes crying spells but denies any feeling of hopelessness helplessness or any worthlessness.  Patient do not remember any suicidal plan, attempt or having any inpatient treatment however as per chart [facility] for [facility] detox.  Patient is not taking any psychotropic medication and he is not interested for inpatient services at this time.  Patient denies any hallucinations, paranoia, suicidal thought, homicidal.  He is willing to get treatment outpatient and agreed to try antidepressant.  Patient is not combative and does not require any emergency medication.  He is cooperative.  He is not aggressive or violent.  He is on Librium to prevent withdrawal symptoms. HPI Elements:   Location:  Medical floor. Quality:  Fair. Severity:  Mild to moderate.  Past Psychiatric History: Past Medical History  Diagnosis Date  . Hypertension   . Hyperlipemia   . Emphysema   . Stroke   . Chronic pain   . Cirrhosis of liver   .  Fall   . Dental injury   . Bone spur of other site     Left Foot  . Impaired mobility   . Total self care deficit   . Pneumonia   . Emphysema   . ETOH abuse   . Elevated LFTs   . Arthritis   . Anxiety   . Sinusitis   . Anemia     iron def.  . Hard of hearing   . Diabetes mellitus without complication   . COPD (chronic obstructive pulmonary disease)   . Depression   . Glaucoma   . Emphysema of lung   . Cataract     reports that he has been smoking Cigarettes.  He has a 60 pack-year smoking history. He has never used smokeless tobacco. He reports that he drinks about 2.4 ounces of alcohol  per week. He reports that he does not use illicit drugs. Family History  Problem Relation Age of Onset  . Breast cancer Mother   . Diabetes Maternal Aunt   . Heart disease Sister   . Heart disease Maternal Aunt    Family History Substance Abuse: Yes, Describe: Family Supports: Yes, List: Living Arrangements: Alone Can pt return to current living arrangement?: Yes Abuse/Neglect [facility]) Physical Abuse: Denies Verbal Abuse: Denies Sexual Abuse: Denies Allergies:   Allergies  Allergen Reactions  . Poison Ivy Extract [employer] Of Poison Ivy] Rash    ACT Assessment Complete:  Yes:    Educational Status    Risk to Self: Risk to self Suicidal Ideation: No Suicidal Intent: No Is patient at risk for suicide?: No Suicidal Plan?: No Access to Means: No What has been your use of drugs/alcohol within the last 12 months?: Daily Previous Attempts/Gestures: No How many times?:  (None) Other Self Harm Risks:  (None) Triggers for Past Attempts: None known Intentional Self Injurious Behavior: None Family Suicide History: No Recent stressful life event(s): Recent negative physical changes (Feels bad all the time) Persecutory voices/beliefs?: No Depression: Yes Depression Symptoms: Insomnia;Feeling worthless/self pity Substance abuse history and/or treatment for substance abuse?: No Suicide prevention information given to non-admitted patients: Not applicable  Risk to Others: Risk to Others Homicidal Ideation: No Thoughts of Harm to Others: No Current Homicidal Intent: No Current Homicidal Plan: No Access to Homicidal Means: No Identified Victim:  (Na) History of harm to others?: No Assessment of Violence: None Noted Violent Behavior Description: Na Does patient have access to weapons?: No Criminal Charges Pending?: No Does patient have a court date: No  Abuse: Abuse/Neglect Assessment (Assessment to be complete while patient is alone) Physical Abuse: Denies Verbal Abuse:  Denies Sexual Abuse: Denies Exploitation of patient/patient's resources: Denies Self-Neglect: Denies  Prior Inpatient Therapy: Prior Inpatient Therapy Prior Inpatient Therapy: Yes Prior Therapy Dates: 11/14 Prior Therapy Facilty/Provider(s): [facility] Reason for Treatment: Detox  Prior Outpatient Therapy: Prior Outpatient Therapy Prior Outpatient Therapy: No Prior Therapy Dates: Na Prior Therapy Facilty/Provider(s): Na Reason for Treatment: Na  Additional Information: Additional Information 1:1 In Past 12 Months?: No CIRT Risk: No Elopement Risk: No Does patient have medical clearance?: Yes                  Objective: Blood pressure 135/82, pulse 113, temperature 98.6 F (37 C), temperature source Oral, resp. rate 16, SpO2 99.00%.There is no weight on file to calculate BMI. Results for orders placed during the hospital encounter of 06/28/13 (from the past 72 hour(s))  ACETAMINOPHEN LEVEL     Status: None   Collection  Time    06/28/13  3:40 PM      Result Value Range   Acetaminophen (Tylenol), Serum <15.0  10 - 30 ug/mL   Comment:            THERAPEUTIC CONCENTRATIONS VARY     SIGNIFICANTLY. A RANGE OF 10-30     ug/mL MAY BE AN EFFECTIVE     CONCENTRATION FOR MANY PATIENTS.     HOWEVER, SOME ARE BEST TREATED     AT CONCENTRATIONS OUTSIDE THIS     RANGE.     ACETAMINOPHEN CONCENTRATIONS     >150 ug/mL AT 4 HOURS AFTER     INGESTION AND >50 ug/mL AT 12     HOURS AFTER INGESTION ARE     OFTEN ASSOCIATED WITH TOXIC     REACTIONS.  CBC     Status: Abnormal   Collection Time    06/28/13  3:40 PM      Result Value Range   WBC 4.2  4.0 - 10.5 K/uL   RBC 3.08 (*) 4.22 - 5.81 MIL/uL   Hemoglobin 8.3 (*) 13.0 - 17.0 g/dL   HCT 16.1 (*) 09.6 - 04.5 %   MCV 80.2  78.0 - 100.0 fL   MCH 26.9  26.0 - 34.0 pg   MCHC 33.6  30.0 - 36.0 g/dL   RDW 40.9 (*) 81.1 - 91.4 %   Platelets 281  150 - 400 K/uL  COMPREHENSIVE METABOLIC PANEL     Status: Abnormal   Collection Time     06/28/13  3:40 PM      Result Value Range   Sodium 130 (*) 135 - 145 mEq/L   Potassium 3.7  3.5 - 5.1 mEq/L   Chloride 96  96 - 112 mEq/L   CO2 15 (*) 19 - 32 mEq/L   Glucose, Bld 94  70 - 99 mg/dL   BUN 8  6 - 23 mg/dL   Creatinine, Ser 7.82  0.50 - 1.35 mg/dL   Calcium 8.9  8.4 - 95.6 mg/dL   Total Protein 7.8  6.0 - 8.3 g/dL   Albumin 3.3 (*) 3.5 - 5.2 g/dL   AST 28  0 - 37 U/L   ALT 19  0 - 53 U/L   Alkaline Phosphatase 91  39 - 117 U/L   Total Bilirubin 0.2 (*) 0.3 - 1.2 mg/dL   GFR calc non Af Amer >90  >90 mL/min   GFR calc Af Amer >90  >90 mL/min   Comment: (NOTE)     The eGFR has been calculated using the CKD EPI equation.     This calculation has not been validated in all clinical situations.     eGFR's persistently <90 mL/min signify possible Chronic Kidney     Disease.  ETHANOL     Status: Abnormal   Collection Time    06/28/13  3:40 PM      Result Value Range   Alcohol, Ethyl (B) 477 (*) 0 - 11 mg/dL   Comment:            LOWEST DETECTABLE LIMIT FOR     SERUM ALCOHOL IS 11 mg/dL     FOR MEDICAL PURPOSES ONLY     CRITICAL RESULT CALLED TO, READ BACK BY AND VERIFIED WITH:     HALL,K. RN @1630  ON 06/18/13 BY MCCOY,N.      CORRECT DATE OF CALL 06/28/13  SALICYLATE LEVEL     Status: Abnormal   Collection Time  06/28/13  3:40 PM      Result Value Range   Salicylate Lvl <2.0 (*) 2.8 - 20.0 mg/dL  CK     Status: None   Collection Time    06/28/13  3:40 PM      Result Value Range   Total CK 137  7 - 232 U/L  URINE RAPID DRUG SCREEN (HOSP PERFORMED)     Status: None   Collection Time    06/28/13  4:27 PM      Result Value Range   Opiates NONE DETECTED  NONE DETECTED   Cocaine NONE DETECTED  NONE DETECTED   Benzodiazepines NONE DETECTED  NONE DETECTED   Amphetamines NONE DETECTED  NONE DETECTED   Tetrahydrocannabinol NONE DETECTED  NONE DETECTED   Barbiturates NONE DETECTED  NONE DETECTED   Comment:            DRUG SCREEN FOR MEDICAL PURPOSES     ONLY.   IF CONFIRMATION IS NEEDED     FOR ANY PURPOSE, NOTIFY LAB     WITHIN 5 DAYS.                LOWEST DETECTABLE LIMITS     FOR URINE DRUG SCREEN     Drug Class       Cutoff (ng/mL)     Amphetamine      1000     Barbiturate      200     Benzodiazepine   200     Tricyclics       300     Opiates          300     Cocaine          300     THC              50  ETHANOL     Status: Abnormal   Collection Time    06/29/13  2:32 AM      Result Value Range   Alcohol, Ethyl (B) 128 (*) 0 - 11 mg/dL   Comment:            LOWEST DETECTABLE LIMIT FOR     SERUM ALCOHOL IS 11 mg/dL     FOR MEDICAL PURPOSES ONLY  CBC WITH DIFFERENTIAL     Status: Abnormal   Collection Time    06/29/13  8:10 AM      Result Value Range   WBC 5.6  4.0 - 10.5 K/uL   RBC 3.58 (*) 4.22 - 5.81 MIL/uL   Hemoglobin 9.7 (*) 13.0 - 17.0 g/dL   HCT 34.7 (*) 42.5 - 95.6 %   MCV 80.2  78.0 - 100.0 fL   MCH 27.1  26.0 - 34.0 pg   MCHC 33.8  30.0 - 36.0 g/dL   RDW 38.7 (*) 56.4 - 33.2 %   Platelets 345  150 - 400 K/uL   Neutrophils Relative % 64  43 - 77 %   Neutro Abs 3.6  1.7 - 7.7 K/uL   Lymphocytes Relative 19  12 - 46 %   Lymphs Abs 1.1  0.7 - 4.0 K/uL   Monocytes Relative 16 (*) 3 - 12 %   Monocytes Absolute 0.9  0.1 - 1.0 K/uL   Eosinophils Relative 0  0 - 5 %   Eosinophils Absolute 0.0  0.0 - 0.7 K/uL   Basophils Relative 0  0 - 1 %   Basophils Absolute 0.0  0.0 - 0.1 K/uL  COMPREHENSIVE METABOLIC PANEL     Status: Abnormal   Collection Time    06/29/13  8:10 AM      Result Value Range   Sodium 140  135 - 145 mEq/L   Comment: DELTA CHECK NOTED     REPEATED TO VERIFY   Potassium 3.7  3.5 - 5.1 mEq/L   Chloride 103  96 - 112 mEq/L   CO2 22  19 - 32 mEq/L   Glucose, Bld 87  70 - 99 mg/dL   BUN 7  6 - 23 mg/dL   Creatinine, Ser 1.61  0.50 - 1.35 mg/dL   Calcium 9.2  8.4 - 09.6 mg/dL   Total Protein 8.8 (*) 6.0 - 8.3 g/dL   Albumin 3.7  3.5 - 5.2 g/dL   AST 27  0 - 37 U/L   ALT 18  0 - 53 U/L    Alkaline Phosphatase 95  39 - 117 U/L   Total Bilirubin 0.4  0.3 - 1.2 mg/dL   GFR calc non Af Amer >90  >90 mL/min   GFR calc Af Amer >90  >90 mL/min   Comment: (NOTE)     The eGFR has been calculated using the CKD EPI equation.     This calculation has not been validated in all clinical situations.     eGFR's persistently <90 mL/min signify possible Chronic Kidney     Disease.  CK     Status: None   Collection Time    06/29/13  8:10 AM      Result Value Range   Total CK 126  7 - 232 U/L  TROPONIN I     Status: None   Collection Time    06/29/13  8:10 AM      Result Value Range   Troponin I <0.30  <0.30 ng/mL   Comment:            Due to the release kinetics of cTnI,     a negative result within the first hours     of the onset of symptoms does not rule out     myocardial infarction with certainty.     If myocardial infarction is still suspected,     repeat the test at appropriate intervals.  ACETAMINOPHEN LEVEL     Status: None   Collection Time    06/29/13  8:10 AM      Result Value Range   Acetaminophen (Tylenol), Serum <15.0  10 - 30 ug/mL   Comment:            THERAPEUTIC CONCENTRATIONS VARY     SIGNIFICANTLY. A RANGE OF 10-30     ug/mL MAY BE AN EFFECTIVE     CONCENTRATION FOR MANY PATIENTS.     HOWEVER, SOME ARE BEST TREATED     AT CONCENTRATIONS OUTSIDE THIS     RANGE.     ACETAMINOPHEN CONCENTRATIONS     >150 ug/mL AT 4 HOURS AFTER     INGESTION AND >50 ug/mL AT 12     HOURS AFTER INGESTION ARE     OFTEN ASSOCIATED WITH TOXIC     REACTIONS.  SALICYLATE LEVEL     Status: Abnormal   Collection Time    06/29/13  8:10 AM      Result Value Range   Salicylate Lvl <2.0 (*) 2.8 - 20.0 mg/dL  ETHANOL     Status: None   Collection Time    06/29/13  8:10 AM  Result Value Range   Alcohol, Ethyl (B) <11  0 - 11 mg/dL   Comment:            LOWEST DETECTABLE LIMIT FOR     SERUM ALCOHOL IS 11 mg/dL     FOR MEDICAL PURPOSES ONLY  AMMONIA     Status: Abnormal    Collection Time    06/29/13  8:10 AM      Result Value Range   Ammonia 73 (*) 11 - 60 umol/L  URINALYSIS, ROUTINE W REFLEX MICROSCOPIC     Status: Abnormal   Collection Time    06/29/13  8:33 AM      Result Value Range   Color, Urine YELLOW  YELLOW   APPearance CLEAR  CLEAR   Specific Gravity, Urine 1.007  1.005 - 1.030   pH 6.5  5.0 - 8.0   Glucose, UA NEGATIVE  NEGATIVE mg/dL   Hgb urine dipstick NEGATIVE  NEGATIVE   Bilirubin Urine NEGATIVE  NEGATIVE   Ketones, ur NEGATIVE  NEGATIVE mg/dL   Protein, ur NEGATIVE  NEGATIVE mg/dL   Urobilinogen, UA 0.2  0.0 - 1.0 mg/dL   Nitrite NEGATIVE  NEGATIVE   Leukocytes, UA LARGE (*) NEGATIVE  URINE MICROSCOPIC-ADD ON     Status: Abnormal   Collection Time    06/29/13  8:33 AM      Result Value Range   Squamous Epithelial / LPF RARE  RARE   WBC, UA 11-20  <3 WBC/hpf   RBC / HPF 3-6  <3 RBC/hpf   Bacteria, UA FEW (*) RARE  URINE CULTURE     Status: None   Collection Time    06/29/13  8:33 AM      Result Value Range   Specimen Description URINE, RANDOM     Special Requests NONE     Culture  Setup Time       Value: 06/29/2013 13:28     Performed at [facility] Count       Value: >=100,000 COLONIES/ML     Performed at [facility]   Culture       Value: GRAM NEGATIVE RODS     Performed at [facility]   Report Status PENDING    CG4 I-STAT (LACTIC ACID)     Status: None   Collection Time    06/29/13  8:56 AM      Result Value Range   Lactic Acid, Venous 1.18  0.5 - 2.2 mmol/L  MRSA PCR SCREENING     Status: None   Collection Time    06/29/13  2:50 PM      Result Value Range   MRSA by PCR NEGATIVE  NEGATIVE   Comment:            The GeneXpert MRSA Assay (FDA     approved for NASAL specimens     only), is one component of a     comprehensive MRSA colonization     surveillance program. It is not     intended to diagnose MRSA     infection nor to guide or     monitor treatment for     MRSA  infections.  MAGNESIUM     Status: None   Collection Time    06/29/13  3:51 PM      Result Value Range   Magnesium 1.5  1.5 - 2.5 mg/dL  PHOSPHORUS     Status: None   Collection Time  06/29/13  3:51 PM      Result Value Range   Phosphorus 2.6  2.3 - 4.6 mg/dL  CBC     Status: Abnormal   Collection Time    06/30/13  3:40 AM      Result Value Range   WBC 4.3  4.0 - 10.5 K/uL   RBC 3.01 (*) 4.22 - 5.81 MIL/uL   Hemoglobin 8.2 (*) 13.0 - 17.0 g/dL   HCT 16.1 (*) 09.6 - 04.5 %   MCV 80.1  78.0 - 100.0 fL   MCH 27.2  26.0 - 34.0 pg   MCHC 34.0  30.0 - 36.0 g/dL   RDW 40.9 (*) 81.1 - 91.4 %   Platelets 289  150 - 400 K/uL  COMPREHENSIVE METABOLIC PANEL     Status: Abnormal   Collection Time    06/30/13  3:40 AM      Result Value Range   Sodium 137  135 - 145 mEq/L   Potassium 3.2 (*) 3.5 - 5.1 mEq/L   Chloride 103  96 - 112 mEq/L   CO2 22  19 - 32 mEq/L   Glucose, Bld 99  70 - 99 mg/dL   BUN 12  6 - 23 mg/dL   Creatinine, Ser 7.82  0.50 - 1.35 mg/dL   Calcium 8.8  8.4 - 95.6 mg/dL   Total Protein 6.6  6.0 - 8.3 g/dL   Albumin 2.8 (*) 3.5 - 5.2 g/dL   AST 17  0 - 37 U/L   ALT 13  0 - 53 U/L   Alkaline Phosphatase 70  39 - 117 U/L   Total Bilirubin 0.3  0.3 - 1.2 mg/dL   GFR calc non Af Amer 88 (*) >90 mL/min   GFR calc Af Amer >90  >90 mL/min   Comment: (NOTE)     The eGFR has been calculated using the CKD EPI equation.     This calculation has not been validated in all clinical situations.     eGFR's persistently <90 mL/min signify possible Chronic Kidney     Disease.  AMMONIA     Status: None   Collection Time    06/30/13  3:40 AM      Result Value Range   Ammonia 40  11 - 60 umol/L   Labs are reviewed and are pertinent for increase alcohol level.  Current Facility-Administered Medications  Medication Dose Route Frequency Provider Last Rate Last Dose  . acamprosate (CAMPRAL) tablet 333 mg  333 mg Oral TID Shuvon Rankin, NP   333 mg at 06/30/13 0925  .  acetaminophen (TYLENOL) tablet 650 mg  650 mg Oral Q4H PRN Mora Bellman, PA-C      . albuterol (PROVENTIL HFA;VENTOLIN HFA) 108 (90 BASE) MCG/ACT inhaler 2 puff  2 puff Inhalation QID Shuvon Rankin, NP   2 puff at 06/30/13 1126  . alum & mag hydroxide-simeth (MAALOX/MYLANTA) 200-200-20 MG/5ML suspension 30 mL  30 mL Oral PRN Mora Bellman, PA-C      . atorvastatin (LIPITOR) tablet 80 mg  80 mg Oral Daily Shuvon Rankin, NP   80 mg at 06/30/13 0925  . brimonidine (ALPHAGAN) 0.2 % ophthalmic solution 1 drop  1 drop Both Eyes BID Shuvon Rankin, NP   1 drop at 06/30/13 1130  . budesonide-formoterol (SYMBICORT) 80-4.5 MCG/ACT inhaler 2 puff  2 puff Inhalation BID Shuvon Rankin, NP   2 puff at 06/30/13 1126  . cefTRIAXone (ROCEPHIN) 1 g in dextrose 5 %  50 mL IVPB  1 g Intravenous Q24H Dorothea Ogle, MD   1 g at 06/29/13 1730  . chlordiazePOXIDE (LIBRIUM) capsule 25 mg  25 mg Oral Q6H PRN Shuvon Rankin, NP      . chlordiazePOXIDE (LIBRIUM) capsule 25 mg  25 mg Oral TID Shuvon Rankin, NP   25 mg at 06/30/13 1132   Followed by  . [START ON 07/01/2013] chlordiazePOXIDE (LIBRIUM) capsule 25 mg  25 mg Oral BH-qamhs Shuvon Rankin, NP       Followed by  . [START ON 07/02/2013] chlordiazePOXIDE (LIBRIUM) capsule 25 mg  25 mg Oral Daily Shuvon Rankin, NP      . diclofenac (VOLTAREN) EC tablet 50 mg  50 mg Oral TID Shanna Cisco, MD   50 mg at 06/30/13 0925  . enoxaparin (LOVENOX) injection 40 mg  40 mg Subcutaneous Q24H Dorothea Ogle, MD   40 mg at 06/29/13 1536  . ferrous sulfate tablet 325 mg  325 mg Oral Q breakfast Shuvon Rankin, NP   325 mg at 06/30/13 0900  . folic acid (FOLVITE) tablet 1 mg  1 mg Oral Daily Shuvon Rankin, NP   1 mg at 06/30/13 0925  . HYDROcodone-acetaminophen (NORCO/VICODIN) 5-325 MG per tablet 1-2 tablet  1-2 tablet Oral Q4H PRN Dorothea Ogle, MD      . HYDROmorphone (DILAUDID) injection 1 mg  1 mg Intravenous Q4H PRN Dorothea Ogle, MD      . hydrOXYzine (ATARAX/VISTARIL) tablet  25 mg  25 mg Oral Q6H PRN Shuvon Rankin, NP      . ibuprofen (ADVIL,MOTRIN) tablet 600 mg  600 mg Oral Q8H PRN Mora Bellman, PA-C      . loperamide (IMODIUM) capsule 2-4 mg  2-4 mg Oral PRN Shuvon Rankin, NP      . multivitamin with minerals tablet 1 tablet  1 tablet Oral Daily Shuvon Rankin, NP   1 tablet at 06/30/13 0925  . nicotine (NICODERM CQ - dosed in mg/24 hours) patch 21 mg  21 mg Transdermal Daily Mora Bellman, PA-C   21 mg at 06/30/13 5284  . ondansetron (ZOFRAN) tablet 4 mg  4 mg Oral Q6H PRN Dorothea Ogle, MD       Or  . ondansetron [facility]) injection 4 mg  4 mg Intravenous Q6H PRN Dorothea Ogle, MD      . pantoprazole (PROTONIX) EC tablet 40 mg  40 mg Oral Daily Shuvon Rankin, NP   40 mg at 06/30/13 0925  . propranolol (INDERAL) tablet 10 mg  10 mg Oral TID Shuvon Rankin, NP   10 mg at 06/30/13 0925  . thiamine (VITAMIN B-1) tablet 100 mg  100 mg Oral Daily Mora Bellman, PA-C   100 mg at 06/30/13 1324   Or  . thiamine (B-1) injection 100 mg  100 mg Intravenous Daily Mora Bellman, PA-C      . traZODone (DESYREL) tablet 50 mg  50 mg Oral QHS Shuvon Rankin, NP   50 mg at 06/29/13 2259  . vitamin E capsule 200 Units  200 Units Oral Daily Shuvon Rankin, NP   200 Units at 06/30/13 0926    Psychiatric Specialty Exam:     Blood pressure 135/82, pulse 113, temperature 98.6 F (37 C), temperature source Oral, resp. rate 16, SpO2 99.00%.There is no weight on file to calculate BMI.  General Appearance: Fairly Groomed  Patent attorney::  Fair  Speech:  Slow  Volume:  Decreased  Mood:  Dysphoric  Affect:  Constricted  Thought Process:  Logical and Loose  Orientation:  Full (Time, Place, and Person)  Thought Content:  Rumination  Suicidal Thoughts:  No  Homicidal Thoughts:  No  Memory:  Immediate;   Good Recent;   Fair Remote;   Fair  Judgement:  Fair  Insight:  Fair  Psychomotor Activity:  Decreased  Concentration:  Fair  Recall:  Fair  Akathisia:  No  Handed:   Right  AIMS (if indicated):     Assets:  Communication Skills Desire for Improvement Housing  Sleep:      Treatment Plan Summary: Medication management Patient does not require inpatient psychiatric services.  He like to start antidepressant and get treatment as an outpatient.  Continue Librium to prevent withdrawal symptoms.  Start Celexa 10 mg daily to help the depressive symptoms.  Social worker please arrange outpatient services and referral for medication management and counseling.  Recommend to stop drinking and to attend AA meeting.  Please call [phone] if there any further questions  Peter Mccarley T. 06/30/2013 1:07 PM

## 2013-07-01 LAB — COMPREHENSIVE METABOLIC PANEL
ALT: 12 U/L (ref 0–53)
Albumin: 2.8 g/dL — ABNORMAL LOW (ref 3.5–5.2)
Alkaline Phosphatase: 64 U/L (ref 39–117)
Calcium: 8.9 mg/dL (ref 8.4–10.5)
Creatinine, Ser: 1 mg/dL (ref 0.50–1.35)
GFR calc Af Amer: >90 mL/min (ref >90)
Glucose, Bld: 94 mg/dL (ref 70–99)
Potassium: 3.7 mEq/L (ref 3.5–5.1)
Sodium: 138 mEq/L (ref 135–145)
Total Protein: 6.3 g/dL (ref 6.0–8.3)

## 2013-07-01 LAB — CBC
HCT: 23.9 % — ABNORMAL LOW (ref 39.0–52.0)
Hemoglobin: 8 g/dL — ABNORMAL LOW (ref 13.0–17.0)
MCHC: 33.5 g/dL (ref 30.0–36.0)
RBC: 2.96 MIL/uL — ABNORMAL LOW (ref 4.22–5.81)
WBC: 4.7 10*3/uL (ref 4.0–10.5)

## 2013-07-01 LAB — URINE CULTURE: Colony Count: >=100000

## 2013-07-01 MED ORDER — HYDROCODONE-ACETAMINOPHEN 5-325 MG PO TABS: 1.0000 | ORAL_TABLET | ORAL | Status: DC | PRN

## 2013-07-01 MED ORDER — CIPROFLOXACIN HCL 500 MG PO TABS: 500.0000 mg | ORAL_TABLET | Freq: Two times a day (BID) | ORAL | Status: DC

## 2013-07-01 MED ORDER — HYDROXYZINE HCL 25 MG PO TABS: 25.0000 mg | ORAL_TABLET | Freq: Four times a day (QID) | ORAL | Status: DC | PRN

## 2013-07-01 NOTE — Progress Notes (Signed)
Clinical Social Work Department CLINICAL SOCIAL WORK PSYCHIATRY SERVICE LINE ASSESSMENT 07/01/2013  Patient:  Peter [name]  Account:  [id number]  Admit Date:  06/28/2013  Clinical Social Worker:  Unk Lightning, LCSW  Date/Time:  07/01/2013 10:00 AM Referred by:  Physician  Date referred:  07/01/2013 Reason for Referral  Psychosocial assessment   Presenting Symptoms/Problems (In the person's/family's own words):   Psych consulted due to depression and alcohol use.   Abuse/Neglect/Trauma History (check all that apply)  Denies history   Abuse/Neglect/Trauma Comments:   Psychiatric History (check all that apply)  Outpatient treatment   Psychiatric medications:  Trazodone 50 mg   Current Mental Health Hospitalizations/Previous Mental Health History:   Patient reports he has been feeling depressed for the past 3 years. Patient denies any follow up with psychiatrist but reports he has been attending AA meetings at a local church for the past 3 weeks.   Current provider:   None   Place and Date:   N/A   Current Medications:   Scheduled Meds:      . acamprosate  333 mg Oral TID  . albuterol  2 puff Inhalation QID  . atorvastatin  80 mg Oral Daily  . brimonidine  1 drop Both Eyes BID  . budesonide-formoterol  2 puff Inhalation BID  . cefTRIAXone (ROCEPHIN)  IV  1 g Intravenous Q24H  . chlordiazePOXIDE  25 mg Oral BH-qamhs   Followed by     . [START ON 07/02/2013] chlordiazePOXIDE  25 mg Oral Daily  . diclofenac  50 mg Oral TID  . enoxaparin (LOVENOX) injection  40 mg Subcutaneous Q24H  . ferrous sulfate  325 mg Oral Q breakfast  . folic acid  1 mg Oral Daily  . multivitamin with minerals  1 tablet Oral Daily  . nicotine  21 mg Transdermal Daily  . pantoprazole  40 mg Oral Daily  . propranolol  10 mg Oral TID  . thiamine  100 mg Oral Daily   Or     . thiamine  100 mg Intravenous Daily  . traZODone  50 mg Oral QHS  . vitamin E  200 Units Oral Daily        Continuous  Infusions:      PRN Meds:.acetaminophen, alum & mag hydroxide-simeth, chlordiazePOXIDE, HYDROcodone-acetaminophen, HYDROmorphone (DILAUDID) injection, hydrOXYzine, ibuprofen, loperamide, ondansetron (ZOFRAN) IV, ondansetron       Previous Impatient Admission/Date/Reason:   None reported   Emotional Health / Current Symptoms    Suicide/Self Harm  None reported   Suicide attempt in the past:   Patient denies any previous attempts. Patient denies any current SI or HI and reports "I'm not that stupid to ever hurt myself."   Other harmful behavior:   None   Psychotic/Dissociative Symptoms  None reported   Other Psychotic/Dissociative Symptoms:   N/A    Attention/Behavioral Symptoms  Within Normal Limits   Other Attention / Behavioral Symptoms:   Patient engaged throughout assessment.    Cognitive Impairment  Within Normal Limits   Other Cognitive Impairment:   Patient alert and oriented.    Mood and Adjustment  Flat    Stress, Anxiety, Trauma, Any Recent Loss/Stressor  Other - See comment   Anxiety (frequency):   N/A   Phobia (specify):   N/A   Compulsive behavior (specify):   N/A   Obsessive behavior (specify):   N/A   Other:   Patient reports struggle with adjusting to life after strokes.   Substance Abuse/Use  Current  substance use   SBIRT completed (please refer for detailed history):  Y  Self-reported substance use:   Patient agreeable to complete SBIRT and reports he drinks about 2 40 oz beers a day. Patient reports he has drank his entire life but has noticed an increase consumption after strokes and feeling depressed. Patient acknowledges a connection between depression and alcohol use but is not interested in any counseling.   Urinary Drug Screen Completed:  Y Alcohol level:   128    Environmental/Housing/Living Arrangement  Stable housing   Who is in the home:   Brother   Emergency contact:  Dollie-mom   Financial  Medicare   Medicaid   Patient's Strengths and Goals (patient's own words):   Patient reports that mom and brother are supportive. Patient has been attending AA meetings through [employer].   Clinical Social Worker's Interpretive Summary:   CSW received referral in order to complete psychosocial assessment. CSW reviewed chart and met with patient at bedside. CSW introduced myself and explained role.    Patient reports that he lives at home with brother and that brother assists with ADLs when needed. Patient reports that brother and mom are very supportive. Patient reports that he was independent prior to strokes. Patient feels helpless and feels his life has changed drastically after strokes. Patient reports he is feeling depressed and has been for about the past three years.    Patient reports that depression has caused him to isolate more and become more irritable. Patient reports that to manage depression he has begun drinking more alcohol. Patient aware of negative side affects from alcohol and aware that MD feels he should eliminate use. Patient reports he does not feel he is consuming that much alcohol and uses alcohol as a tool to socialize with others. Patient reports he had attended a few meetings at a local church and has spoken to members regarding his depression and alcohol use.    CSW and patient discussed following up on outpatient basis for SA and depression. Patient reports he has to see several doctors due to health problems and does not want to follow up with psychiatrist. CSW explained the benefits of formal support and that it could assist with his overall health. Patient declines any referrals and reports he will continue to look for church for support but does not want any medication.    Patient alert and oriented. Patient engaged during assessment and agreeable to complete SBIRT but even with high consumption score, patient is not interested in SA treatment. Patient seems to have good  informal support but could benefit from formal outpatient follow up. Patient thanked CSW for time but reports he is ready to DC.    Per chart review, psych MD reports that patient does not meet criteria for inpatient treatment.   Disposition:  Outpatient referral made/needed   [place], [place] [phone]

## 2013-07-01 NOTE — Discharge Summary (Signed)
Physician Discharge Summary  Peter [name] ZOX:[id number] DOB: 05-11-1951 DOA: 06/28/2013  PCP: No primary provider on file.  Admit date: 06/28/2013 Discharge date: 07/01/2013  Recommendations for Outpatient Follow-up:  1. Pt will need to follow up with PCP in 2-3 weeks post discharge 2. Please obtain BMP to evaluate electrolytes and kidney function 3. Please also check CBC to evaluate Hg and Hct levels 4. Pt was discharged on Ciprofloxacin to complete therapy for UTI  Discharge Diagnoses:    Acute alcohol intoxication   UTI (urinary tract infection)  Discharge Condition: Stable  Diet recommendation: Heart healthy diet discussed in details   Brief narrative:  Pt is 62 yo male with HTN, HLD, stroke, liver cirrhosis secondary to alcohol abuse, COPD who was brought by family member to [facility] ED after found to be more confused at home and was on the floor for unknown period of time. Pt is relatively poor historian and offers very little history so most of the details are provided by ED doctor and available records. There is a report that pt drank fifth of vodka and two 40 ounces beers and has been abusing alcohol daily. He has been admitted to ICU for withdrawal symptoms in the past. [facility] asked to admit for potential alcohol withdrawal. Alcohol level on admission > 100 and ammonia level > 70.   Assessment and Plan:  Active Problems:  Acute alcohol intoxication  - repeat alcohol level in blood < 11  - no signs of withdrawal over the past 24 hours, plan on transferring to regular medical bed  - pt is clinically stable and wants to go home this AM - pt tolerating current diet well  UTI (urinary tract infection)  - suggestive per UA  - urine culture pending  - continue Rocephin day #3 and will switch to Ciprofloxacin upon discharge 500 mg BID PO Chronic alcohol abuse  - cessation discussed in detail  Liver cirrhosis, alcoholic  - LFT's stable for now  - ammonia level is within normal  limits and LFT also WNL this AM  COPD (chronic obstructive pulmonary disease)  - clinically compensated  - maintaining oxygen saturations at target range  Hyponatremia  - sodium is WNL this AM, d/c IVF  Iron deficiency anemia  - Hg and Hct slightly down over 24 hours but could be dilutional  - no signs of active bleed  - CBC in AM  Depression (emotion)  - psych consult appreciated Hypokalemia  - WNL this AM  Code Status: Full  Family Communication: no family at bedside   Consultants:  Psych Procedures/Studies:  Ct Head Wo Contrast 06/29/2013 Mild atrophy and mild periventricular small vessel disease. There is no intracranial mass, hemorrhage, or acute appearing infarct. There is ethmoid sinus disease bilaterally, more on the left than on the right.  Antibiotics:  Rocephin 12/24 --> 12/26 Ciprofloxacin 12/26 -->  Code Status: Full  Family Communication: Pt at bedside  Discharge Exam: Filed Vitals:   07/01/13 0545  BP: 115/76  Pulse: 86  Temp: 98.4 F (36.9 C)  Resp: 16   Filed Vitals:   06/30/13 1811 06/30/13 2237 07/01/13 0545 07/01/13 0741  BP:  145/83 115/76   Pulse:  84 86   Temp:  98.4 F (36.9 C) 98.4 F (36.9 C)   TempSrc:  Oral Oral   Resp:  18 16   SpO2: 99% 100% 100% 98%    General: Pt is alert, follows commands appropriately, not in acute distress Cardiovascular: Regular rate and rhythm,  S1/S2 +, no murmurs, no rubs, no gallops Respiratory: Clear to auscultation bilaterally, no wheezing, no crackles, no rhonchi Abdominal: Soft, non tender, non distended, bowel sounds +, no guarding Extremities: no edema, no cyanosis, pulses palpable bilaterally DP and PT Neuro: Grossly nonfocal  Discharge Instructions  Discharge Orders   Future Appointments Provider Department Dept Phone   07/11/2013 3:30 PM Jeanann Lewandowsky, MD [facility] [phone]   Future Orders Complete By Expires   Diet - low sodium heart healthy  As  directed    Increase activity slowly  As directed        Medication List         acamprosate 333 MG tablet  Commonly known as:  CAMPRAL  TAKE 2 TABLETS BY MOUTH THREE TIMES A DAY     atorvastatin 80 MG tablet  Commonly known as:  LIPITOR  Take 1 tablet (80 mg total) by mouth daily.     brimonidine 0.2 % ophthalmic solution  Commonly known as:  ALPHAGAN  Place 1 drop into both eyes 2 (two) times daily.     budesonide-formoterol 80-4.5 MCG/ACT inhaler  Commonly known as:  SYMBICORT  Inhale 2 puffs into the lungs 2 (two) times daily. For COPD.     ciprofloxacin 0.3 % ophthalmic solution  Commonly known as:  CILOXAN  Place 1 drop into the left eye 4 (four) times daily. Administer 1 drop, every 2 hours, while awake, for 2 days. Then 1 drop, every 4 hours, while awake, for the next 5 days.     ciprofloxacin 500 MG tablet  Commonly known as:  CIPRO  Take 1 tablet (500 mg total) by mouth 2 (two) times daily.     diclofenac 50 MG tablet  Commonly known as:  CATAFLAM  Take 1 tablet (50 mg total) by mouth 3 (three) times daily. Prn back pain.     ferrous sulfate 325 (65 FE) MG tablet  Take 1 tablet (325 mg total) by mouth daily with breakfast. For low iron.     folic acid 1 MG tablet  Commonly known as:  FOLVITE  TAKE 1 TABLET BY MOUTH DAILY     HYDROcodone-acetaminophen 5-325 MG per tablet  Commonly known as:  NORCO/VICODIN  Take 1-2 tablets by mouth every 4 (four) hours as needed for moderate pain.     hydrOXYzine 25 MG tablet  Commonly known as:  ATARAX/VISTARIL  Take 1 tablet (25 mg total) by mouth every 6 (six) hours as needed for anxiety (or CIWA score </= 10).     multivitamin tablet  Take 1 tablet by mouth daily.     nicotine 7 mg/24hr patch  Commonly known as:  NICODERM CQ - dosed in mg/24 hr  Place 1 patch onto the skin daily.     pantoprazole 40 MG tablet  Commonly known as:  PROTONIX  TAKE 1 TABLET BY MOUTH EVERY DAY     prednisoLONE acetate 0.12 %  ophthalmic suspension  Commonly known as:  PRED MILD  Place 1 drop into the left eye 4 (four) times daily.     PROAIR HFA 108 (90 BASE) MCG/ACT inhaler  Generic drug:  albuterol  INHALE 2 PUFFS BY MOUTH 4 TIMES DAILY     PROLENSA 0.07 % Soln  Generic drug:  Bromfenac Sodium  Apply 1 drop to eye daily. Left eye     propranolol 10 MG tablet  Commonly known as:  INDERAL  Take 1 tablet (10 mg total) by  mouth 3 (three) times daily.     thiamine 100 MG tablet  Commonly known as:  VITAMIN B-1  Take 1 tablet (100 mg total) by mouth daily. For vitamin B deficiency.     traZODone 50 MG tablet  Commonly known as:  DESYREL  Take 1 tablet (50 mg total) by mouth at bedtime.     vitamin B-12 100 MCG tablet  Commonly known as:  CYANOCOBALAMIN  Take 1 tablet (100 mcg total) by mouth daily. For low B12.     vitamin E 200 UNIT capsule  Take 1 capsule (200 Units total) by mouth daily.           Follow-up Information   Follow up with Debbora Presto, MD In 2 weeks.   Specialty:  Internal Medicine   Contact information:   [address] [place] [address] [phone]        The results of significant diagnostics from this hospitalization (including imaging, microbiology, ancillary and laboratory) are listed below for reference.     Microbiology: Recent Results (from the past 240 hour(s))  URINE CULTURE     Status: None   Collection Time    06/29/13  8:33 AM      Result Value Range Status   Specimen Description URINE, RANDOM   Final   Special Requests NONE   Final   Culture  Setup Time     Final   Value: 06/29/2013 13:28     Performed at [facility] Count     Final   Value: >=100,000 COLONIES/ML     Performed at [facility]   Culture     Final   Value: GRAM NEGATIVE RODS     Performed at [facility]   Report Status PENDING   Incomplete  MRSA PCR SCREENING     Status: None   Collection Time    06/29/13  2:50 PM      Result  Value Range Status   MRSA by PCR NEGATIVE  NEGATIVE Final   Comment:            The GeneXpert MRSA Assay (FDA     approved for NASAL specimens     only), is one component of a     comprehensive MRSA colonization     surveillance program. It is not     intended to diagnose MRSA     infection nor to guide or     monitor treatment for     MRSA infections.     Labs: Basic Metabolic Panel:  Recent Labs Lab 06/28/13 1540 06/29/13 0810 06/29/13 1551 06/30/13 0340 07/01/13 0530  NA 130* 140  --  137 138  K 3.7 3.7  --  3.2* 3.7  CL 96 103  --  103 105  CO2 15* 22  --  22 25  GLUCOSE 94 87  --  99 94  BUN 8 7  --  12 13  CREATININE 0.72 0.73  --  0.97 1.00  CALCIUM 8.9 9.2  --  8.8 8.9  MG  --   --  1.5  --   --   PHOS  --   --  2.6  --   --    Liver Function Tests:  Recent Labs Lab 06/28/13 1540 06/29/13 0810 06/30/13 0340 07/01/13 0530  AST 28 27 17 17   ALT 19 18 13 12   ALKPHOS 91 95 70 64  BILITOT 0.2* 0.4 0.3 0.2*  PROT 7.8 8.8* 6.6 6.3  ALBUMIN 3.3* 3.7 2.8* 2.8*    Recent Labs Lab 06/29/13 0810 06/30/13 0340  AMMONIA 73* 40   CBC:  Recent Labs Lab 06/28/13 1540 06/29/13 0810 06/30/13 0340 07/01/13 0530  WBC 4.2 5.6 4.3 4.7  NEUTROABS  --  3.6  --   --   HGB 8.3* 9.7* 8.2* 8.0*  HCT 24.7* 28.7* 24.1* 23.9*  MCV 80.2 80.2 80.1 80.7  PLT 281 345 289 316   Cardiac Enzymes:  Recent Labs Lab 06/28/13 1540 06/29/13 0810  CKTOTAL 137 126  TROPONINI  --  <0.30     SIGNED: Time coordinating discharge: Over 30 minutes  Debbora Presto, MD  [facility] 07/01/2013, 9:03 AM Pager [phone]  If 7PM-7AM, please contact night-coverage [email or web address] Password [email or web address]

## 2013-07-01 NOTE — Progress Notes (Signed)
Pt did not want to be set up on [email or web address] b/c he doesn't have a computer at home.

## 2013-07-02 NOTE — ED Provider Notes (Signed)
Medical screening examination/treatment/procedure(s) were performed by non-physician practitioner and as supervising physician I was immediately available for consultation/collaboration.  EKG Interpretation    Date/Time:  Tuesday June 28 2013 15:27:18 EST Ventricular Rate:  106 PR Interval:  164 QRS Duration: 90 QT Interval:  345 QTC Calculation: 458 R Axis:   77 Text Interpretation:  Sinus tachycardia Ventricular premature complex Borderline ST elevation, lateral leads Baseline wander in lead(s) V2 V4 V5 V6 ED PHYSICIAN INTERPRETATION AVAILABLE IN [facility] Confirmed by TEST, RECORD ([id number]) on 06/30/2013 1:43:15 PM              Shanna Cisco, MD 07/02/13 [phone]

## 2013-07-11 ENCOUNTER — Ambulatory Visit: Payer: Self-pay | Admitting: Internal Medicine

## 2013-07-29 IMAGING — CR DG CHEST 1V PORT
1 series · 2 of 2 positions shown · non-contrast
Comparison: 01/12/2013

CLINICAL DATA: Fever.

PORTABLE CHEST - 1 VIEW

[Series 1: AP · U · 2 of 2 slices shown]
[im 1/2]
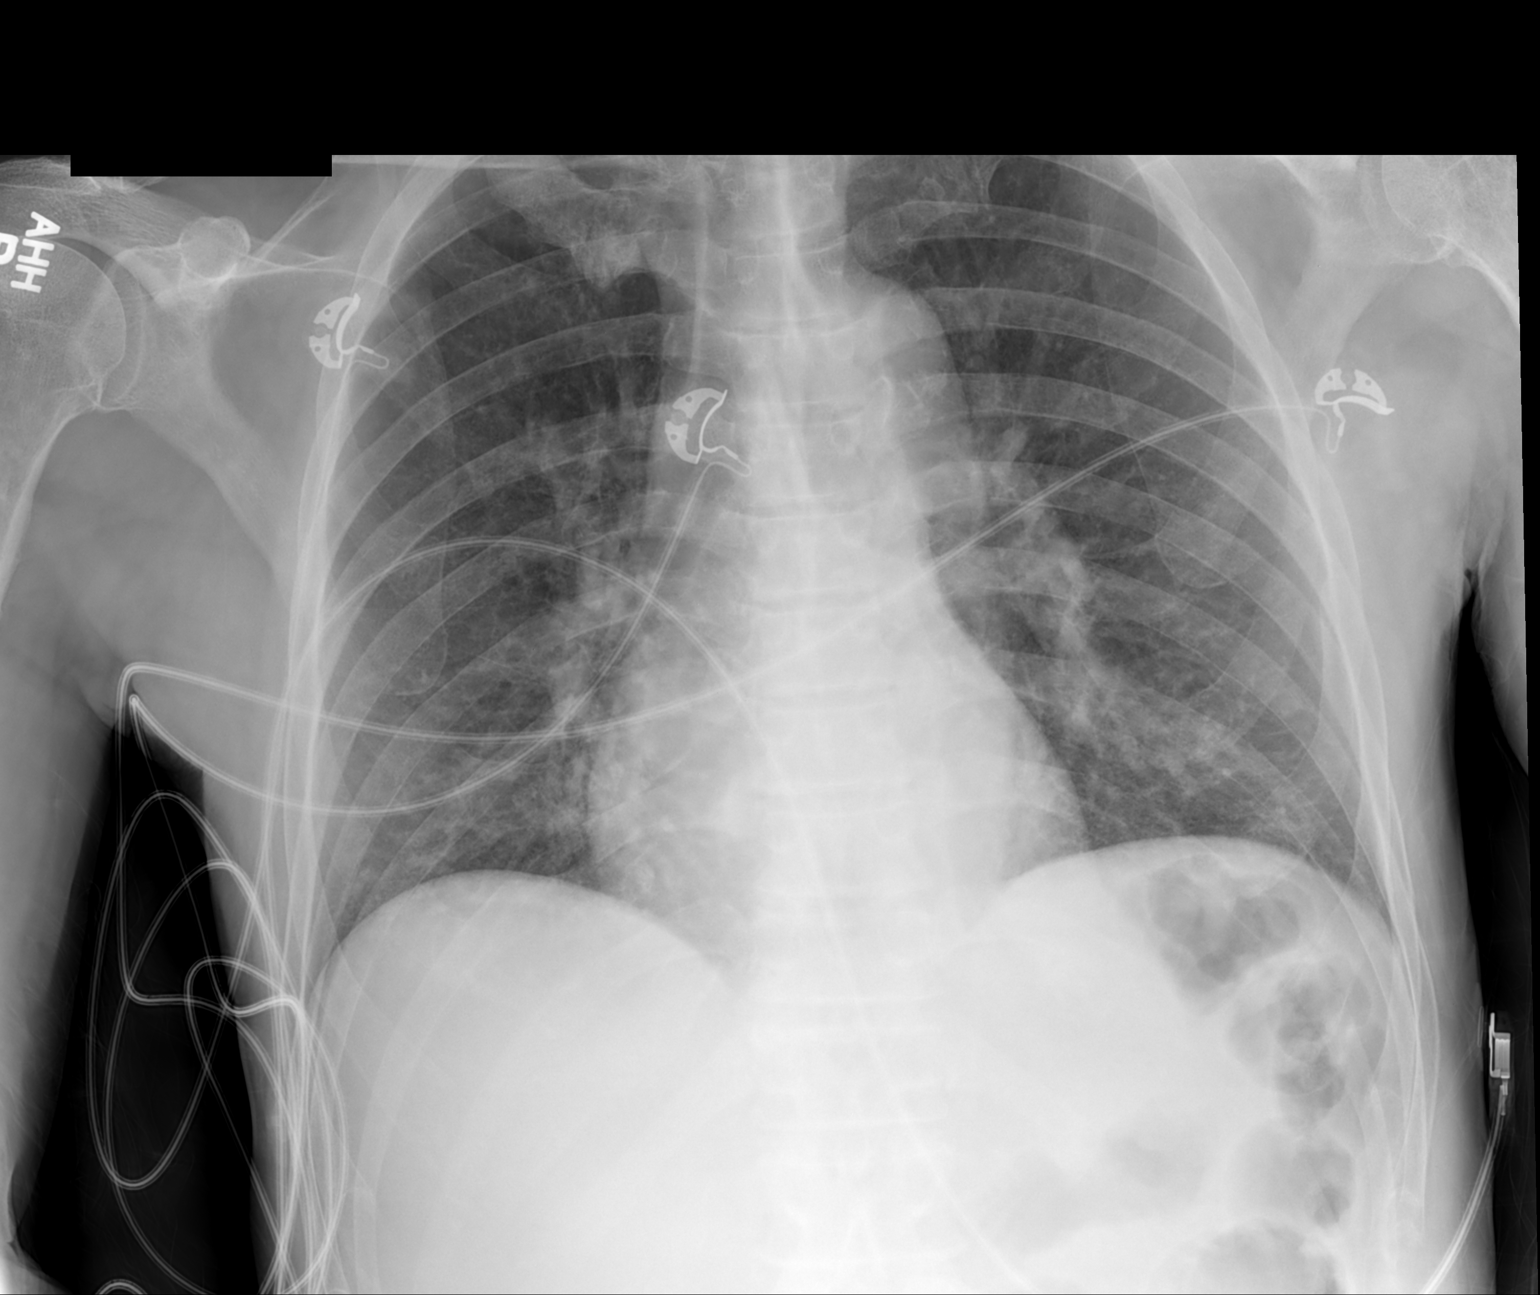
[im 2/2]
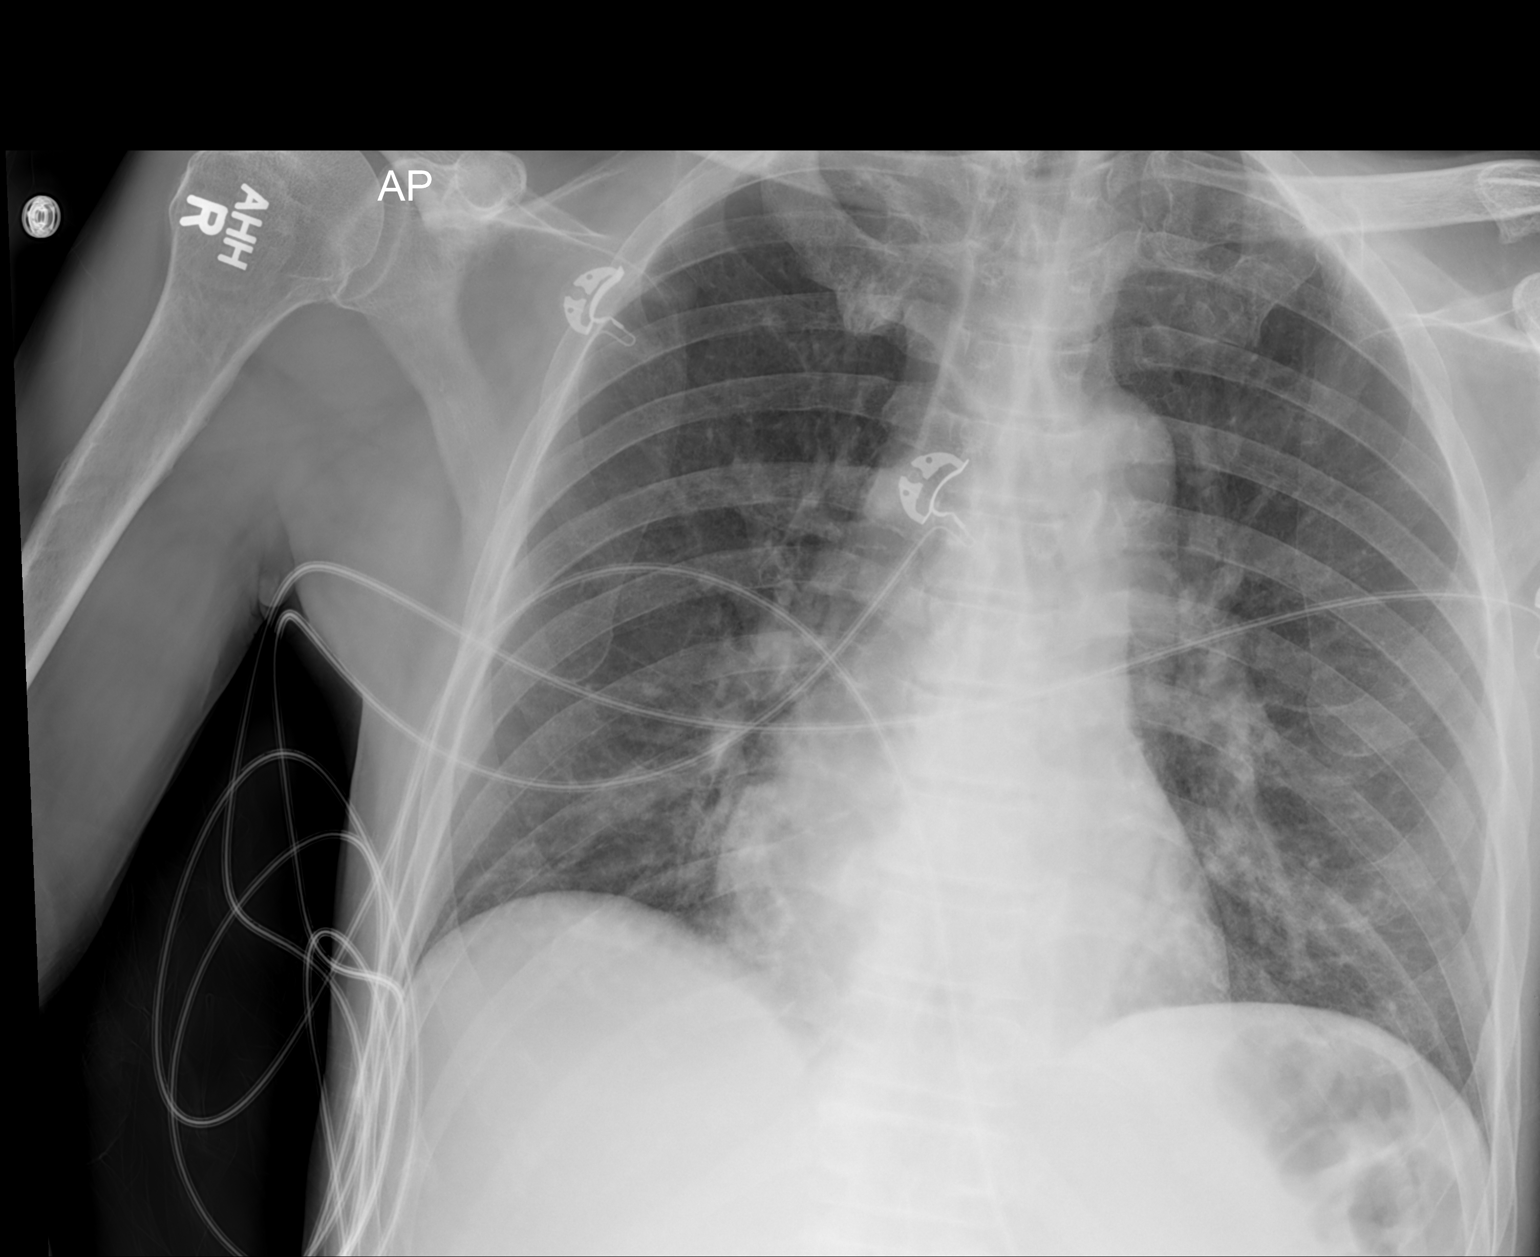

[2 of 2 positions shown; findings below may reference images not displayed]

FINDINGS: Slightly lower lung volumes noted bilaterally with
bibasilar atelectasis present.  No overt consolidation, edema or
pleural fluid is identified.  There remains evidence of underlying
COPD.  The heart size is within normal limits.
IMPRESSION: Low lung volumes with bibasilar atelectasis present.

## 2013-08-01 ENCOUNTER — Encounter (HOSPITAL_COMMUNITY): Payer: Self-pay | Admitting: Emergency Medicine

## 2013-08-01 ENCOUNTER — Emergency Department (HOSPITAL_COMMUNITY): Payer: Medicare Other

## 2013-08-01 ENCOUNTER — Inpatient Hospital Stay (HOSPITAL_COMMUNITY)
Admission: EM | Admit: 2013-08-01 | Discharge: 2013-08-04 | DRG: 641 | Disposition: A | Discharge status: Home / Self Care | Payer: Medicare Other | Attending: Internal Medicine | Admitting: Internal Medicine

## 2013-08-01 DIAGNOSIS — D638 Anemia in other chronic diseases classified elsewhere: Secondary | ICD-10-CM | POA: Diagnosis present

## 2013-08-01 DIAGNOSIS — J449 Chronic obstructive pulmonary disease, unspecified: Secondary | ICD-10-CM

## 2013-08-01 DIAGNOSIS — M129 Arthropathy, unspecified: Secondary | ICD-10-CM | POA: Diagnosis present

## 2013-08-01 DIAGNOSIS — E785 Hyperlipidemia, unspecified: Secondary | ICD-10-CM | POA: Diagnosis present

## 2013-08-01 DIAGNOSIS — E46 Unspecified protein-calorie malnutrition: Secondary | ICD-10-CM | POA: Diagnosis present

## 2013-08-01 DIAGNOSIS — E119 Type 2 diabetes mellitus without complications: Secondary | ICD-10-CM | POA: Diagnosis present

## 2013-08-01 DIAGNOSIS — E871 Hypo-osmolality and hyponatremia: Principal | ICD-10-CM

## 2013-08-01 DIAGNOSIS — D509 Iron deficiency anemia, unspecified: Secondary | ICD-10-CM | POA: Diagnosis present

## 2013-08-01 DIAGNOSIS — F3289 Other specified depressive episodes: Secondary | ICD-10-CM | POA: Diagnosis present

## 2013-08-01 DIAGNOSIS — F17200 Nicotine dependence, unspecified, uncomplicated: Secondary | ICD-10-CM | POA: Diagnosis present

## 2013-08-01 DIAGNOSIS — K7030 Alcoholic cirrhosis of liver without ascites: Secondary | ICD-10-CM | POA: Diagnosis present

## 2013-08-01 DIAGNOSIS — K7460 Unspecified cirrhosis of liver: Secondary | ICD-10-CM

## 2013-08-01 DIAGNOSIS — D649 Anemia, unspecified: Secondary | ICD-10-CM

## 2013-08-01 DIAGNOSIS — IMO0002 Reserved for concepts with insufficient information to code with codable children: Secondary | ICD-10-CM

## 2013-08-01 DIAGNOSIS — F10229 Alcohol dependence with intoxication, unspecified: Secondary | ICD-10-CM | POA: Diagnosis present

## 2013-08-01 DIAGNOSIS — G8929 Other chronic pain: Secondary | ICD-10-CM | POA: Diagnosis present

## 2013-08-01 DIAGNOSIS — R42 Dizziness and giddiness: Secondary | ICD-10-CM

## 2013-08-01 DIAGNOSIS — J4489 Other specified chronic obstructive pulmonary disease: Secondary | ICD-10-CM | POA: Diagnosis present

## 2013-08-01 DIAGNOSIS — I10 Essential (primary) hypertension: Secondary | ICD-10-CM | POA: Diagnosis present

## 2013-08-01 DIAGNOSIS — Z8673 Personal history of transient ischemic attack (TIA), and cerebral infarction without residual deficits: Secondary | ICD-10-CM

## 2013-08-01 DIAGNOSIS — H409 Unspecified glaucoma: Secondary | ICD-10-CM | POA: Diagnosis present

## 2013-08-01 DIAGNOSIS — R197 Diarrhea, unspecified: Secondary | ICD-10-CM | POA: Diagnosis present

## 2013-08-01 DIAGNOSIS — Z79899 Other long term (current) drug therapy: Secondary | ICD-10-CM

## 2013-08-01 DIAGNOSIS — R5383 Other fatigue: Secondary | ICD-10-CM

## 2013-08-01 DIAGNOSIS — F1010 Alcohol abuse, uncomplicated: Secondary | ICD-10-CM

## 2013-08-01 DIAGNOSIS — F411 Generalized anxiety disorder: Secondary | ICD-10-CM | POA: Diagnosis present

## 2013-08-01 DIAGNOSIS — Z833 Family history of diabetes mellitus: Secondary | ICD-10-CM

## 2013-08-01 DIAGNOSIS — F329 Major depressive disorder, single episode, unspecified: Secondary | ICD-10-CM | POA: Diagnosis present

## 2013-08-01 DIAGNOSIS — R5381 Other malaise: Secondary | ICD-10-CM

## 2013-08-01 DIAGNOSIS — H9190 Unspecified hearing loss, unspecified ear: Secondary | ICD-10-CM | POA: Diagnosis present

## 2013-08-01 DIAGNOSIS — F10929 Alcohol use, unspecified with intoxication, unspecified: Secondary | ICD-10-CM | POA: Diagnosis present

## 2013-08-01 DIAGNOSIS — R531 Weakness: Secondary | ICD-10-CM

## 2013-08-01 LAB — URINALYSIS, ROUTINE W REFLEX MICROSCOPIC
Bilirubin Urine: NEGATIVE
GLUCOSE, UA: NEGATIVE mg/dL
Hgb urine dipstick: NEGATIVE
Ketones, ur: NEGATIVE mg/dL
Nitrite: NEGATIVE
Protein, ur: NEGATIVE mg/dL
Specific Gravity, Urine: 1.005 (ref 1.005–1.030)
Urobilinogen, UA: 0.2 mg/dL (ref 0.0–1.0)
pH: 5 (ref 5.0–8.0)

## 2013-08-01 LAB — COMPREHENSIVE METABOLIC PANEL
ALBUMIN: 4.2 g/dL (ref 3.5–5.2)
ALT: 15 U/L (ref 0–53)
AST: 28 U/L (ref 0–37)
Alkaline Phosphatase: 88 U/L (ref 39–117)
BILIRUBIN TOTAL: 0.3 mg/dL (ref 0.3–1.2)
BUN: 9 mg/dL (ref 6–23)
CO2: 21 mEq/L (ref 19–32)
CREATININE: 0.8 mg/dL (ref 0.50–1.35)
Calcium: 9.5 mg/dL (ref 8.4–10.5)
Chloride: 90 mEq/L — ABNORMAL LOW (ref 96–112)
GFR calc Af Amer: >90 mL/min (ref >90)
GFR calc non Af Amer: >90 mL/min (ref >90)
Glucose, Bld: 89 mg/dL (ref 70–99)
Potassium: 3.9 mEq/L (ref 3.7–5.3)
Sodium: 128 mEq/L — ABNORMAL LOW (ref 137–147)
Total Protein: 8.6 g/dL — ABNORMAL HIGH (ref 6.0–8.3)

## 2013-08-01 LAB — RAPID URINE DRUG SCREEN, HOSP PERFORMED
Amphetamines: NOT DETECTED
Barbiturates: NOT DETECTED
Benzodiazepines: NOT DETECTED
Cocaine: NOT DETECTED
OPIATES: NOT DETECTED
Tetrahydrocannabinol: NOT DETECTED

## 2013-08-01 LAB — CBC WITH DIFFERENTIAL/PLATELET
BASOS PCT: 1 % (ref 0–1)
Basophils Absolute: 0 10*3/uL (ref 0.0–0.1)
EOS ABS: 0 10*3/uL (ref 0.0–0.7)
EOS PCT: 1 % (ref 0–5)
HEMATOCRIT: 29.1 % — AB (ref 39.0–52.0)
HEMOGLOBIN: 9.7 g/dL — AB (ref 13.0–17.0)
Lymphocytes Relative: 36 % (ref 12–46)
Lymphs Abs: 1.6 10*3/uL (ref 0.7–4.0)
MCH: 27.8 pg (ref 26.0–34.0)
MCHC: 33.3 g/dL (ref 30.0–36.0)
MCV: 83.4 fL (ref 78.0–100.0)
MONO ABS: 0.7 10*3/uL (ref 0.1–1.0)
MONOS PCT: 15 % — AB (ref 3–12)
Neutro Abs: 2.1 10*3/uL (ref 1.7–7.7)
Neutrophils Relative %: 48 % (ref 43–77)
Platelets: 199 10*3/uL (ref 150–400)
RBC: 3.49 MIL/uL — ABNORMAL LOW (ref 4.22–5.81)
RDW: 16.6 % — ABNORMAL HIGH (ref 11.5–15.5)
WBC: 4.4 10*3/uL (ref 4.0–10.5)

## 2013-08-01 LAB — URINE MICROSCOPIC-ADD ON

## 2013-08-01 LAB — ETHANOL: ALCOHOL ETHYL (B): 296 mg/dL — AB (ref 0–11)

## 2013-08-01 LAB — AMMONIA

## 2013-08-01 MED ORDER — ATORVASTATIN CALCIUM 80 MG PO TABS
80.0000 mg | ORAL_TABLET | Freq: Every day | ORAL | Status: DC
  Administered 2013-08-01 – 2013-08-03 (×3): 80 mg via ORAL
  Filled 2013-08-01 (×4): qty 1

## 2013-08-01 MED ORDER — LORAZEPAM 2 MG/ML IJ SOLN: 1.0000 mg | Freq: Four times a day (QID) | INTRAMUSCULAR | Status: DC | PRN

## 2013-08-01 MED ORDER — THIAMINE HCL 100 MG/ML IJ SOLN: 100.0000 mg | Freq: Every day | INTRAMUSCULAR | Status: DC

## 2013-08-01 MED ORDER — LORAZEPAM 2 MG/ML IJ SOLN: 0.0000 mg | Freq: Two times a day (BID) | INTRAMUSCULAR | Status: DC

## 2013-08-01 MED ORDER — NICOTINE 7 MG/24HR TD PT24
7.0000 mg | MEDICATED_PATCH | TRANSDERMAL | Status: DC
  Administered 2013-08-01 – 2013-08-03 (×3): 7 mg via TRANSDERMAL
  Filled 2013-08-01 (×4): qty 1

## 2013-08-01 MED ORDER — LORAZEPAM 1 MG PO TABS: 1.0000 mg | ORAL_TABLET | Freq: Four times a day (QID) | ORAL | Status: DC | PRN

## 2013-08-01 MED ORDER — ONDANSETRON HCL 4 MG/2ML IJ SOLN: 4.0000 mg | Freq: Four times a day (QID) | INTRAMUSCULAR | Status: DC | PRN

## 2013-08-01 MED ORDER — ACAMPROSATE CALCIUM 333 MG PO TBEC
333.0000 mg | DELAYED_RELEASE_TABLET | Freq: Three times a day (TID) | ORAL | Status: DC
  Administered 2013-08-01 – 2013-08-04 (×8): 333 mg via ORAL
  Filled 2013-08-01 (×10): qty 1

## 2013-08-01 MED ORDER — ALBUTEROL SULFATE HFA 108 (90 BASE) MCG/ACT IN AERS: 1.0000 | INHALATION_SPRAY | RESPIRATORY_TRACT | Status: DC | PRN

## 2013-08-01 MED ORDER — LORAZEPAM 2 MG/ML IJ SOLN
0.0000 mg | Freq: Four times a day (QID) | INTRAMUSCULAR | Status: AC
  Administered 2013-08-03: 2 mg via INTRAVENOUS
  Filled 2013-08-01: qty 1

## 2013-08-01 MED ORDER — ALBUTEROL SULFATE (2.5 MG/3ML) 0.083% IN NEBU: 2.5000 mg | INHALATION_SOLUTION | RESPIRATORY_TRACT | Status: DC | PRN

## 2013-08-01 MED ORDER — LORAZEPAM 1 MG PO TABS: 0.0000 mg | ORAL_TABLET | Freq: Two times a day (BID) | ORAL | Status: DC

## 2013-08-01 MED ORDER — ADULT MULTIVITAMIN W/MINERALS CH
1.0000 | ORAL_TABLET | Freq: Every day | ORAL | Status: DC
  Administered 2013-08-01 – 2013-08-04 (×4): 1 via ORAL
  Filled 2013-08-01 (×4): qty 1

## 2013-08-01 MED ORDER — FOLIC ACID 1 MG PO TABS: 1.0000 mg | ORAL_TABLET | Freq: Every day | ORAL | Status: DC

## 2013-08-01 MED ORDER — ADULT MULTIVITAMIN W/MINERALS CH: 1.0000 | ORAL_TABLET | Freq: Every day | ORAL | Status: DC

## 2013-08-01 MED ORDER — SODIUM CHLORIDE 0.9 % IV BOLUS (SEPSIS)
1000.0000 mL | Freq: Once | INTRAVENOUS | Status: AC
  Administered 2013-08-01: 1000 mL via INTRAVENOUS

## 2013-08-01 MED ORDER — ACETAMINOPHEN 325 MG PO TABS: 650.0000 mg | ORAL_TABLET | Freq: Four times a day (QID) | ORAL | Status: DC | PRN

## 2013-08-01 MED ORDER — SODIUM CHLORIDE 0.9 % IJ SOLN
3.0000 mL | Freq: Two times a day (BID) | INTRAMUSCULAR | Status: DC
  Administered 2013-08-03: 3 mL via INTRAVENOUS

## 2013-08-01 MED ORDER — BUDESONIDE-FORMOTEROL FUMARATE 80-4.5 MCG/ACT IN AERO
2.0000 | INHALATION_SPRAY | Freq: Two times a day (BID) | RESPIRATORY_TRACT | Status: DC
  Administered 2013-08-01 – 2013-08-04 (×6): 2 via RESPIRATORY_TRACT
  Filled 2013-08-01: qty 6.9

## 2013-08-01 MED ORDER — PROPRANOLOL HCL 10 MG PO TABS
10.0000 mg | ORAL_TABLET | Freq: Three times a day (TID) | ORAL | Status: DC
  Administered 2013-08-01 – 2013-08-04 (×8): 10 mg via ORAL
  Filled 2013-08-01 (×10): qty 1

## 2013-08-01 MED ORDER — BRIMONIDINE TARTRATE 0.2 % OP SOLN: 1.0000 [drp] | Freq: Two times a day (BID) | OPHTHALMIC | Status: DC

## 2013-08-01 MED ORDER — BUDESONIDE-FORMOTEROL FUMARATE 80-4.5 MCG/ACT IN AERO: 2.0000 | INHALATION_SPRAY | Freq: Two times a day (BID) | RESPIRATORY_TRACT | Status: DC

## 2013-08-01 MED ORDER — FERROUS SULFATE 325 (65 FE) MG PO TABS
325.0000 mg | ORAL_TABLET | Freq: Every day | ORAL | Status: DC
  Administered 2013-08-02 – 2013-08-04 (×3): 325 mg via ORAL
  Filled 2013-08-01 (×4): qty 1

## 2013-08-01 MED ORDER — VITAMIN B-1 100 MG PO TABS
100.0000 mg | ORAL_TABLET | Freq: Every day | ORAL | Status: DC
  Administered 2013-08-01 – 2013-08-04 (×3): 100 mg via ORAL
  Filled 2013-08-01 (×4): qty 1

## 2013-08-01 MED ORDER — ONDANSETRON HCL 4 MG PO TABS: 4.0000 mg | ORAL_TABLET | Freq: Four times a day (QID) | ORAL | Status: DC | PRN

## 2013-08-01 MED ORDER — VITAMIN B-12 100 MCG PO TABS
100.0000 ug | ORAL_TABLET | Freq: Every day | ORAL | Status: DC
  Administered 2013-08-02 – 2013-08-04 (×3): 100 ug via ORAL
  Filled 2013-08-01 (×3): qty 1

## 2013-08-01 MED ORDER — LORAZEPAM 1 MG PO TABS
0.0000 mg | ORAL_TABLET | Freq: Four times a day (QID) | ORAL | Status: DC
  Administered 2013-08-01: 1 mg via ORAL
  Filled 2013-08-01: qty 1

## 2013-08-01 MED ORDER — PANTOPRAZOLE SODIUM 40 MG PO TBEC
40.0000 mg | DELAYED_RELEASE_TABLET | Freq: Every day | ORAL | Status: DC
  Administered 2013-08-01 – 2013-08-04 (×4): 40 mg via ORAL
  Filled 2013-08-01 (×4): qty 1

## 2013-08-01 MED ORDER — VITAMIN B-1 100 MG PO TABS
100.0000 mg | ORAL_TABLET | Freq: Every day | ORAL | Status: DC
  Administered 2013-08-01: 100 mg via ORAL
  Filled 2013-08-01: qty 1

## 2013-08-01 MED ORDER — BRIMONIDINE TARTRATE 0.2 % OP SOLN
1.0000 [drp] | Freq: Two times a day (BID) | OPHTHALMIC | Status: DC
Start: 2013-08-01 — End: 2013-08-04
  Administered 2013-08-01 – 2013-08-04 (×6): 1 [drp] via OPHTHALMIC
  Filled 2013-08-01: qty 5

## 2013-08-01 MED ORDER — VITAMIN B-1 100 MG PO TABS: 100.0000 mg | ORAL_TABLET | Freq: Every day | ORAL | Status: DC

## 2013-08-01 MED ORDER — ACETAMINOPHEN 650 MG RE SUPP: 650.0000 mg | Freq: Four times a day (QID) | RECTAL | Status: DC | PRN

## 2013-08-01 MED ORDER — FOLIC ACID 1 MG PO TABS
1.0000 mg | ORAL_TABLET | Freq: Every day | ORAL | Status: DC
  Administered 2013-08-01 – 2013-08-04 (×4): 1 mg via ORAL
  Filled 2013-08-01 (×4): qty 1

## 2013-08-01 MED ORDER — TRAZODONE HCL 50 MG PO TABS
50.0000 mg | ORAL_TABLET | Freq: Every day | ORAL | Status: DC
  Administered 2013-08-01 – 2013-08-03 (×3): 50 mg via ORAL
  Filled 2013-08-01 (×4): qty 1

## 2013-08-01 MED ORDER — THIAMINE HCL 100 MG/ML IJ SOLN
100.0000 mg | Freq: Every day | INTRAMUSCULAR | Status: DC
  Filled 2013-08-01 (×3): qty 1

## 2013-08-01 NOTE — ED Notes (Signed)
Hospitalist, Dr. Hal Hope, at bedside.

## 2013-08-01 NOTE — ED Notes (Signed)
Pt cousin states pt needing detox from alcohol; c/o weakness in legs; last alcohol intake today

## 2013-08-01 NOTE — ED Provider Notes (Signed)
CSN: [id number]     Arrival date & time 08/01/13  1342 History   First MD Initiated Contact with Patient 08/01/13 1834     Chief Complaint  Patient presents with  . detox    (Consider location/radiation/quality/duration/timing/severity/associated sxs/prior Treatment) HPI Comments: 63 year old male with multiple medical problems, including hypertension, hyperlipidemia, stroke, cirrhosis, and alcohol abuse presents with generalized weakness over the past several weeks as well as wanting detox. Patient is accompanied by the cousin. The cousin states the patient has had overall generalized weakness but no focal weakness. He also is intermittently confused and seems to have repetitive questions over the past month. The patient states that his weakness not localized to one side or another. He drinks multiple drinks every day. His last drink was approximately 6 hours ago. Patient has not any fevers or chills. No urinary symptoms. His chronic cirrhosis but still drinks. This is similar to when he was admitted several months ago for hyponatremia.   Past Medical History  Diagnosis Date  . Hypertension   . Hyperlipemia   . Emphysema   . Stroke   . Chronic pain   . Cirrhosis of liver   . Fall   . Dental injury   . Bone spur of other site     Left Foot  . Impaired mobility   . Total self care deficit   . Pneumonia   . Emphysema   . ETOH abuse   . Elevated LFTs   . Arthritis   . Anxiety   . Sinusitis   . Anemia     iron def.  . Hard of hearing   . Diabetes mellitus without complication   . COPD (chronic obstructive pulmonary disease)   . Depression   . Glaucoma   . Emphysema of lung   . Cataract    Past Surgical History  Procedure Laterality Date  . Colonoscopy  01/06/2012    Procedure: COLONOSCOPY;  Surgeon: Beryle Beams, MD;  Location: [facility] ENDOSCOPY;  Service: Endoscopy;  Laterality: N/A;  . Esophagogastroduodenoscopy  01/06/2012    Procedure: ESOPHAGOGASTRODUODENOSCOPY (EGD);   Surgeon: Beryle Beams, MD;  Location: [facility] ENDOSCOPY;  Service: Endoscopy;  Laterality: N/A;  . Esophagogastroduodenoscopy  01/09/2012    Procedure: ESOPHAGOGASTRODUODENOSCOPY (EGD);  Surgeon: Beryle Beams, MD;  Location: [facility] ENDOSCOPY;  Service: Endoscopy;  Laterality: N/A;  . Multiple extractions with alveoloplasty  03/29/2012    Procedure: MULTIPLE EXTRACION WITH ALVEOLOPLASTY;  Surgeon: Gae Bon, DDS;  Location: [place];  Service: Oral Surgery;  Laterality: Bilateral;  . Bone spur  2013    left spur  . Cataract extraction w/ intraocular lens implant    . Mouth surgery    . Colonoscopy     Family History  Problem Relation Age of Onset  . Breast cancer Mother   . Diabetes Maternal Aunt   . Heart disease Sister   . Heart disease Maternal Aunt    History  Substance Use Topics  . Smoking status: Current Every Day Smoker -- 2.00 packs/day for 30 years    Types: Cigarettes  . Smokeless tobacco: Never Used  . Alcohol Use: 2.4 oz/week    4 Cans of beer per week     Comment: per pt four  40oz daily beer    Review of Systems  Constitutional: Negative for fever and chills.  Eyes: Negative for visual disturbance.  Respiratory: Negative for shortness of breath.   Cardiovascular: Negative for chest pain.  Gastrointestinal: Negative for vomiting and  abdominal pain.  Genitourinary: Negative for dysuria.  Neurological: Positive for weakness. Negative for numbness and headaches.  Psychiatric/Behavioral: Positive for confusion (iintermittently).  All other systems reviewed and are negative.    Allergies  Poison ivy extract  Home Medications   Current Outpatient Rx  Name  Route  Sig  Dispense  Refill  . acamprosate (CAMPRAL) 333 MG tablet      TAKE 2 TABLETS BY MOUTH THREE TIMES A DAY   180 tablet   11   . atorvastatin (LIPITOR) 80 MG tablet   Oral   Take 1 tablet (80 mg total) by mouth daily.   30 tablet   2   . brimonidine (ALPHAGAN) 0.2 % ophthalmic solution   Both  Eyes   Place 1 drop into both eyes 2 (two) times daily.         . budesonide-formoterol (SYMBICORT) 80-4.5 MCG/ACT inhaler   Inhalation   Inhale 2 puffs into the lungs 2 (two) times daily.         . ferrous sulfate 325 (65 FE) MG tablet   Oral   Take 1 tablet (325 mg total) by mouth daily with breakfast. For low iron.   30 tablet   2   . folic acid (FOLVITE) 1 MG tablet      TAKE 1 TABLET BY MOUTH DAILY   30 tablet   7   . Multiple Vitamin (MULTIVITAMIN) tablet   Oral   Take 1 tablet by mouth daily.   30 tablet   0   . nicotine (NICODERM CQ - DOSED IN MG/24 HR) 7 mg/24hr patch   Transdermal   Place 1 patch onto the skin daily.   28 patch   0   . pantoprazole (PROTONIX) 40 MG tablet      TAKE 1 TABLET BY MOUTH EVERY DAY   30 tablet   0   . PROAIR HFA 108 (90 BASE) MCG/ACT inhaler      INHALE 2 PUFFS BY MOUTH 4 TIMES DAILY   1 Inhaler   11   . propranolol (INDERAL) 10 MG tablet   Oral   Take 1 tablet (10 mg total) by mouth 3 (three) times daily.   90 tablet   3   . thiamine (VITAMIN B-1) 100 MG tablet   Oral   Take 1 tablet (100 mg total) by mouth daily. For vitamin B deficiency.   30 tablet   2   . traZODone (DESYREL) 50 MG tablet   Oral   Take 1 tablet (50 mg total) by mouth at bedtime.   30 tablet   0   . vitamin B-12 (CYANOCOBALAMIN) 100 MCG tablet   Oral   Take 1 tablet (100 mcg total) by mouth daily. For low B12.   30 tablet   2   . vitamin E 200 UNIT capsule   Oral   Take 1 capsule (200 Units total) by mouth daily.   7 capsule   0   . EXPIRED: budesonide-formoterol (SYMBICORT) 80-4.5 MCG/ACT inhaler   Inhalation   Inhale 2 puffs into the lungs 2 (two) times daily. For COPD.          BP 118/69  Pulse 86  Temp(Src) 97.6 F (36.4 C) (Oral)  Resp 16  SpO2 100% Physical Exam  Nursing note and vitals reviewed. Constitutional: He is oriented to person, place, and time. He appears well-developed and well-nourished.  HENT:   Head: Normocephalic and atraumatic.  Right Ear: External ear normal.  Left Ear: External ear normal.  Nose: Nose normal.  Eyes: Right eye exhibits no discharge. Left eye exhibits no discharge.  Neck: Neck supple.  Cardiovascular: Normal rate, regular rhythm, normal heart sounds and intact distal pulses.   Pulmonary/Chest: Effort normal.  Abdominal: Soft. He exhibits no distension. There is no tenderness.  Musculoskeletal: He exhibits no edema.  Neurological: He is alert and oriented to person, place, and time. No sensory deficit. GCS eye subscore is 4. GCS verbal subscore is 5. GCS motor subscore is 6.  Only able to mildly lift all 4 extremities against resistance. Symmetric effort  Skin: Skin is warm and dry.    ED Course  Procedures (including critical care time) Labs Review Labs Reviewed  CBC WITH DIFFERENTIAL - Abnormal; Notable for the following:    RBC 3.49 (*)    Hemoglobin 9.7 (*)    HCT 29.1 (*)    RDW 16.6 (*)    Monocytes Relative 15 (*)    All other components within normal limits  COMPREHENSIVE METABOLIC PANEL - Abnormal; Notable for the following:    Sodium 128 (*)    Chloride 90 (*)    Total Protein 8.6 (*)    All other components within normal limits  ETHANOL - Abnormal; Notable for the following:    Alcohol, Ethyl (B) 296 (*)    All other components within normal limits  AMMONIA  URINE RAPID DRUG SCREEN (HOSP PERFORMED)  URINALYSIS, ROUTINE W REFLEX MICROSCOPIC   Imaging Review Ct Head Wo Contrast  08/01/2013   CLINICAL DATA:  Weakness and confusion.  EXAM: CT HEAD WITHOUT CONTRAST  TECHNIQUE: Contiguous axial images were obtained from the base of the skull through the vertex without intravenous contrast.  COMPARISON:  CT HEAD W/O CM dated 06/29/2013  FINDINGS: There is stable diffuse atrophy and mild small vessel disease. The brain demonstrates no evidence of hemorrhage, infarction, edema, mass effect, extra-axial fluid collection, hydrocephalus or mass  lesion. The skull is unremarkable.  IMPRESSION: Stable CT of the head.  No acute findings.   Electronically Signed   By: Aletta Edouard M.D.   On: 08/01/2013 19:18    EKG Interpretation   None       MDM   1. Weakness   2. Chronic alcohol abuse    Patient with recurrent hyponatremia causing diffuse weakness. No localizing sx. CT obtained due to intermittent confusion per family. Hyponatremia likely from combination of drinking, diarrhea and poor PO intake. Given this, he was initially give IV fluid bolus. For his alcohol he can have referral while inpatient, will put on CIWA. No signs of withdrawal currently.    Ephraim Hamburger, MD 08/01/13 [phone]

## 2013-08-01 NOTE — H&P (Signed)
[facility] History and Physical  DANYAL KENNON [id number] DOB: 10-21-1950 DOA: 08/01/2013  Referring physician: ER physician. PCP: No primary provider on file.   Chief Complaint: Weakness.  HPI: Peter [name] is a 63 y.o. male with history of chronic alcoholism, cirrhosis of liver, ongoing tobacco abuse, COPD, hyperlipidemia, hypertension was brought to the ER after patient was found to be increasingly weak but patient's family. Patient states that he's been feeling weak over the last one week and has been having falls and difficulty walking. He has been drinking alcohol usually beer everyday throughout the day. In the ER patient was found to be nonfocal. CT head did not show anything acute. Labs reveal mild hyponatremia. Patient does have chronic anemia. Patient otherwise denies any loss of consciousness headache visual symptoms or any weakness of the upper or lower extremities focally. Patient has been having chronic diarrhea denies any nausea vomiting abdominal pain. Patient has been afebrile. Has burning sensation in the chest off and on but presently chest pain-free. Denies any shortness of breath.   Review of Systems: As presented in the history of presenting illness, rest negative.  Past Medical History  Diagnosis Date  . Hypertension   . Hyperlipemia   . Emphysema   . Stroke   . Chronic pain   . Cirrhosis of liver   . Fall   . Dental injury   . Bone spur of other site     Left Foot  . Impaired mobility   . Total self care deficit   . Pneumonia   . Emphysema   . ETOH abuse   . Elevated LFTs   . Arthritis   . Anxiety   . Sinusitis   . Anemia     iron def.  . Hard of hearing   . Diabetes mellitus without complication   . COPD (chronic obstructive pulmonary disease)   . Depression   . Glaucoma   . Emphysema of lung   . Cataract    Past Surgical History  Procedure Laterality Date  . Colonoscopy  01/06/2012    Procedure: COLONOSCOPY;  Surgeon: Beryle Beams, MD;  Location: [facility] ENDOSCOPY;  Service: Endoscopy;  Laterality: N/A;  . Esophagogastroduodenoscopy  01/06/2012    Procedure: ESOPHAGOGASTRODUODENOSCOPY (EGD);  Surgeon: Beryle Beams, MD;  Location: [facility] ENDOSCOPY;  Service: Endoscopy;  Laterality: N/A;  . Esophagogastroduodenoscopy  01/09/2012    Procedure: ESOPHAGOGASTRODUODENOSCOPY (EGD);  Surgeon: Beryle Beams, MD;  Location: [facility] ENDOSCOPY;  Service: Endoscopy;  Laterality: N/A;  . Multiple extractions with alveoloplasty  03/29/2012    Procedure: MULTIPLE EXTRACION WITH ALVEOLOPLASTY;  Surgeon: Gae Bon, DDS;  Location: [place];  Service: Oral Surgery;  Laterality: Bilateral;  . Bone spur  2013    left spur  . Cataract extraction w/ intraocular lens implant    . Mouth surgery    . Colonoscopy     Social History:  reports that he has been smoking Cigarettes.  He has a 60 pack-year smoking history. He has never used smokeless tobacco. He reports that he drinks about 2.4 ounces of alcohol per week. He reports that he does not use illicit drugs. Where does patient live home. Can patient participate in ADLs? Yes.  Allergies  Allergen Reactions  . Poison Ivy Extract [employer]ison [employer]] Rash    Family History:  Family History  Problem Relation Age of Onset  . Breast cancer Mother   . Diabetes Maternal Aunt   . Heart disease Sister   .  Heart disease Maternal Aunt       Prior to Admission medications   Medication Sig Start Date End Date Taking? Authorizing Provider  acamprosate (CAMPRAL) 333 MG tablet TAKE 2 TABLETS BY MOUTH THREE TIMES A DAY 03/29/13  Yes Theodis Blaze, MD  atorvastatin (LIPITOR) 80 MG tablet Take 1 tablet (80 mg total) by mouth daily. 02/16/13  Yes Angelica Chessman, MD  brimonidine (ALPHAGAN) 0.2 % ophthalmic solution Place 1 drop into both eyes 2 (two) times daily.   Yes Historical Provider, MD  budesonide-formoterol (SYMBICORT) 80-4.5 MCG/ACT inhaler Inhale 2 puffs into the lungs 2 (two) times daily.   Yes  Historical Provider, MD  ferrous sulfate 325 (65 FE) MG tablet Take 1 tablet (325 mg total) by mouth daily with breakfast. For low iron. 02/16/13  Yes Angelica Chessman, MD  folic acid (FOLVITE) 1 MG tablet TAKE 1 TABLET BY MOUTH DAILY 03/26/13  Yes Theodis Blaze, MD  Multiple Vitamin (MULTIVITAMIN) tablet Take 1 tablet by mouth daily. 02/16/13  Yes Angelica Chessman, MD  nicotine (NICODERM CQ - DOSED IN MG/24 HR) 7 mg/24hr patch Place 1 patch onto the skin daily. 05/25/12  Yes Waylan Boga, NP  pantoprazole (PROTONIX) 40 MG tablet TAKE 1 TABLET BY MOUTH EVERY DAY 05/27/13  Yes Theodis Blaze, MD  PROAIR HFA 108 [phone] BASE) MCG/ACT inhaler INHALE 2 PUFFS BY MOUTH 4 TIMES DAILY 03/26/13  Yes Theodis Blaze, MD  propranolol (INDERAL) 10 MG tablet Take 1 tablet (10 mg total) by mouth 3 (three) times daily. 06/07/13  Yes Thurnell Lose, MD  thiamine (VITAMIN B-1) 100 MG tablet Take 1 tablet (100 mg total) by mouth daily. For vitamin B deficiency. 02/16/13  Yes Angelica Chessman, MD  traZODone (DESYREL) 50 MG tablet Take 1 tablet (50 mg total) by mouth at bedtime. 02/16/13  Yes Angelica Chessman, MD  vitamin B-12 (CYANOCOBALAMIN) 100 MCG tablet Take 1 tablet (100 mcg total) by mouth daily. For low B12. 02/16/13  Yes Angelica Chessman, MD  vitamin E 200 UNIT capsule Take 1 capsule (200 Units total) by mouth daily. 02/16/13  Yes Angelica Chessman, MD  budesonide-formoterol (SYMBICORT) 80-4.5 MCG/ACT inhaler Inhale 2 puffs into the lungs 2 (two) times daily. For COPD. 02/04/12 04/26/13  Nena Polio, PA-C    Physical Exam: Filed Vitals:   08/01/13 1449 08/01/13 2016 08/01/13 2020  BP: 118/69 134/81 134/81  Pulse: 86 89 96  Temp: 97.6 F (36.4 C)    TempSrc: Oral    Resp: 16  18  SpO2: 100%  97%     General:  Well-developed and moderately nourished.  Eyes: Anicteric no pallor.  ENT: No discharge from the ears eyes nose mouth.  Neck: No mass felt.  Cardiovascular: S1-S2 heard.  Respiratory: No  rhonchi or crepitations.  Abdomen: Soft nontender bowel sounds present.  Skin: No rash.  Musculoskeletal: No edema.  Psychiatric: Appears normal.  Neurologic: Alert and oriented to time place and person. Moves all extremities 5 x 5. No facial asymmetry. Tongue is midline.  Labs on Admission:  Basic Metabolic Panel:  Recent Labs Lab 08/01/13 1445  NA 128*  K 3.9  CL 90*  CO2 21  GLUCOSE 89  BUN 9  CREATININE 0.80  CALCIUM 9.5   Liver Function Tests:  Recent Labs Lab 08/01/13 1445  AST 28  ALT 15  ALKPHOS 88  BILITOT 0.3  PROT 8.6*  ALBUMIN 4.2   No results found for this basename: LIPASE, AMYLASE,  in the  last 168 hours  Recent Labs Lab 08/01/13 1900  AMMONIA RESULTS UNAVAILABLE DUE TO INTERFERING SUBSTANCE   CBC:  Recent Labs Lab 08/01/13 1445  WBC 4.4  NEUTROABS 2.1  HGB 9.7*  HCT 29.1*  MCV 83.4  PLT 199   Cardiac Enzymes: No results found for this basename: CKTOTAL, CKMB, CKMBINDEX, TROPONINI,  in the last 168 hours  BNP (last 3 results) No results found for this basename: PROBNP,  in the last 8760 hours CBG: No results found for this basename: GLUCAP,  in the last 168 hours  Radiological Exams on Admission: Ct Head Wo Contrast  08/01/2013   CLINICAL DATA:  Weakness and confusion.  EXAM: CT HEAD WITHOUT CONTRAST  TECHNIQUE: Contiguous axial images were obtained from the base of the skull through the vertex without intravenous contrast.  COMPARISON:  CT HEAD W/O CM dated 06/29/2013  FINDINGS: There is stable diffuse atrophy and mild small vessel disease. The brain demonstrates no evidence of hemorrhage, infarction, edema, mass effect, extra-axial fluid collection, hydrocephalus or mass lesion. The skull is unremarkable.  IMPRESSION: Stable CT of the head.  No acute findings.   Electronically Signed   By: Aletta Edouard M.D.   On: 08/01/2013 19:18    EKG: Independently reviewed. Normal sinus rhythm.  Assessment/Plan Principal Problem:    Weakness Active Problems:   Hyponatremia   Alcohol intoxication   Cirrhosis   Anemia   1. Generalized weakness - most likely secondary to alcoholism and poor oral intake and deconditioning. CT head is negative for anything acute. Patient on exam is nonfocal. Patient did receive 1 L normal seen bolus in the ER. At this time we will get physical therapy consult. Check TSH, CK levels. Check anemia panel. 2. Alcohol abuse - patient is mildly tremulous. Have placed patient on alcohol withdrawal protocol with IV Ativan. Social work consult requested. Thiamine. 3. Hyponatremia - probably secondary to poor oral intake and beer potomania. Patient has received 1 L normal saline bolus in the ER. We will hold off further fluids at this time and get urine sodium and osmolality. For now we'll keep patient fluid restriction. Based on patient's sodium trends and urine studies we will have further plans. 4. Diarrhea - chronic. But since patient was recently admitted check stool for C. difficile. Diarrhea may be related to alcoholism. 5. Chronic anemia - patient has had EGD and colonoscopy in November last year which were unremarkable. Hemoglobin is at the baseline. Patient denies any bleeding or any blood in the stools or black stools. Closely follow CBC. Check anemia panel. 6. COPD - presently not wheezing. Continue inhalers. 7. Hyperlipidemia - continue statins. 8. Hypertension - continue present medications. 9. Tobacco abuse - patient advised to quit smoking.  I have reviewed patient's old charts.    Code Status: Full code.  Family Communication: Patient's family at the bedside.  Disposition Plan: Admit to inpatient.    Orla Jolliff N. [facility] Pager [phone].  If 7PM-7AM, please contact night-coverage [email or web address] Password [facility] 08/01/2013, 8:58 PM

## 2013-08-02 DIAGNOSIS — F1010 Alcohol abuse, uncomplicated: Secondary | ICD-10-CM

## 2013-08-02 LAB — TSH: TSH: 1.775 u[IU]/mL (ref 0.350–4.500)

## 2013-08-02 LAB — CBC
HEMATOCRIT: 27.7 % — AB (ref 39.0–52.0)
Hemoglobin: 9.3 g/dL — ABNORMAL LOW (ref 13.0–17.0)
MCH: 27.8 pg (ref 26.0–34.0)
MCHC: 33.6 g/dL (ref 30.0–36.0)
MCV: 82.9 fL (ref 78.0–100.0)
PLATELETS: 200 10*3/uL (ref 150–400)
RBC: 3.34 MIL/uL — ABNORMAL LOW (ref 4.22–5.81)
RDW: 16.5 % — AB (ref 11.5–15.5)
WBC: 3.5 10*3/uL — ABNORMAL LOW (ref 4.0–10.5)

## 2013-08-02 LAB — HEPATIC FUNCTION PANEL
ALK PHOS: 71 U/L (ref 39–117)
ALT: 14 U/L (ref 0–53)
AST: 24 U/L (ref 0–37)
Albumin: 3.6 g/dL (ref 3.5–5.2)
Total Bilirubin: 0.4 mg/dL (ref 0.3–1.2)
Total Protein: 7.2 g/dL (ref 6.0–8.3)

## 2013-08-02 LAB — SODIUM, URINE, RANDOM: Sodium, Ur: 30 mEq/L

## 2013-08-02 LAB — BASIC METABOLIC PANEL
BUN: 9 mg/dL (ref 6–23)
CALCIUM: 9 mg/dL (ref 8.4–10.5)
CHLORIDE: 102 meq/L (ref 96–112)
CO2: 21 mEq/L (ref 19–32)
Creatinine, Ser: 0.86 mg/dL (ref 0.50–1.35)
GFR calc non Af Amer: >90 mL/min (ref >90)
Glucose, Bld: 97 mg/dL (ref 70–99)
Potassium: 4 mEq/L (ref 3.7–5.3)
Sodium: 138 mEq/L (ref 137–147)

## 2013-08-02 LAB — OSMOLALITY, URINE: Osmolality, Ur: 138 mOsm/kg — ABNORMAL LOW (ref 390–1090)

## 2013-08-02 LAB — AMMONIA: AMMONIA: 34 umol/L (ref 11–60)

## 2013-08-02 LAB — ETHANOL: Alcohol, Ethyl (B): <11 mg/dL (ref 0–11)

## 2013-08-02 MED ORDER — SODIUM CHLORIDE 0.9 % IV SOLN
INTRAVENOUS | Status: DC
  Administered 2013-08-02 (×2): via INTRAVENOUS

## 2013-08-02 MED ORDER — DRONABINOL 5 MG PO CAPS
5.0000 mg | ORAL_CAPSULE | Freq: Two times a day (BID) | ORAL | Status: DC
  Administered 2013-08-02 (×2): 5 mg via ORAL
  Filled 2013-08-02 (×2): qty 1

## 2013-08-02 NOTE — Progress Notes (Signed)
Patient ID: Peter [name], male   DOB: 07-13-1950, 63 y.o.   MRN: [id number]  [facility] PROGRESS NOTE  THELMA VIANA MGN:[id number] DOB: August 19, 1950 DOA: 08/01/2013 PCP: No primary provider on file.  Brief narrative: 63 y.o. male with history of chronic alcoholism, cirrhosis of liver, ongoing tobacco abuse, COPD, hyperlipidemia, hypertension was brought to the ER after patient was found to be increasingly weak per patient's family. Patient states that he's been feeling weak over the last week and has been having difficulty walking. He has been drinking alcohol, usually beer everyday throughout the day. In the ER, CT head did not show anything acute. Labs reveal mild hyponatremia, elevated alcohol level. [facility] asked to admit for further evaluation.   Principal Problem:   Weakness - this is most likely secondary to alcohol intoxication, poor oral intake and dehydration  - now better this AM - will continue IVF, PT evaluation ordered Active Problems:   Hyponatremia - secondary to acute alcohol intoxication - now WNL, will continue IVF and will repeat BMP in AM   Cirrhosis - LFT's WNL this AM   Alcohol intoxication - continue supportive care, IVF as noted above - repeat alcohol level    Anemia of chronic disease - no signs of bleeding - CBC in AM   Diarrhea - C. Diff pending   Consultants:  None Procedures/Studies: Ct Head Wo Contrast   08/01/2013   Stable CT of the head.  No acute findings.   Electronically Signed   By: Aletta Edouard M.D.   On: 08/01/2013 19:18  Antibiotics:  None  Code Status: Full Family Communication: Pt at bedside Disposition Plan: Home when medically stable  HPI/Subjective: No events overnight.   Objective: Filed Vitals:   08/02/13 0600 08/02/13 0915 08/02/13 0925 08/02/13 1359  BP: 119/74   102/65  Pulse: 71 72  92  Temp: 99.3 F (37.4 C)   99.1 F (37.3 C)  TempSrc: Oral   Oral  Resp: 18   20  Height:      Weight:      SpO2: 93%  95%  100%    Intake/Output Summary (Last 24 hours) at 08/02/13 1808 Last data filed at 08/02/13 1401  Gross per 24 hour  Intake   1480 ml  Output   1450 ml  Net     30 ml    Exam:   General:  Pt is alert, follows commands appropriately, not in acute distress  Cardiovascular: Regular rhythm, tachycardic, S1/S2, no murmurs, no rubs, no gallops  Respiratory: Clear to auscultation bilaterally, no wheezing, no crackles, no rhonchi  Abdomen: Soft, non tender, non distended, bowel sounds present, no guarding  Extremities: No edema, pulses DP and PT palpable bilaterally  Neuro: Grossly nonfocal but tremors noted at rest   Data Reviewed: Basic Metabolic Panel:  Recent Labs Lab 08/01/13 1445 08/02/13 0520  NA 128* 138  K 3.9 4.0  CL 90* 102  CO2 21 21  GLUCOSE 89 97  BUN 9 9  CREATININE 0.80 0.86  CALCIUM 9.5 9.0   Liver Function Tests:  Recent Labs Lab 08/01/13 1445 08/02/13 0520  AST 28 24  ALT 15 14  ALKPHOS 88 71  BILITOT 0.3 0.4  PROT 8.6* 7.2  ALBUMIN 4.2 3.6    Recent Labs Lab 08/01/13 1900 08/02/13 0520  AMMONIA RESULTS UNAVAILABLE DUE TO INTERFERING SUBSTANCE 34   CBC:  Recent Labs Lab 08/01/13 1445 08/02/13 0520  WBC 4.4 3.5*  NEUTROABS 2.1  --  HGB 9.7* 9.3*  HCT 29.1* 27.7*  MCV 83.4 82.9  PLT 199 200   Scheduled Meds: . acamprosate  333 mg Oral TID  . atorvastatin  80 mg Oral Q2200  . brimonidine  1 drop Both Eyes BID  . budesonide-formoterol  2 puff Inhalation BID  . dronabinol  5 mg Oral BID AC  . ferrous sulfate  325 mg Oral Q breakfast  . folic acid  1 mg Oral Daily  . LORazepam  0-4 mg Intravenous Q6H   Followed by  . [START ON 08/03/2013] LORazepam  0-4 mg Intravenous Q12H  . multivitamin with minerals  1 tablet Oral Daily  . nicotine  7 mg Transdermal Q24H  . pantoprazole  40 mg Oral Daily  . propranolol  10 mg Oral TID  . sodium chloride  3 mL Intravenous Q12H  . sodium chloride  3 mL Intravenous Q12H  . thiamine  100  mg Oral Daily   Or  . thiamine  100 mg Intravenous Daily  . traZODone  50 mg Oral QHS  . vitamin B-12  100 mcg Oral Daily   Continuous Infusions: . sodium chloride 100 mL/hr at 08/02/13 1349     Faye Ramsay, MD  [facility] Pager [phone]  If 7PM-7AM, please contact night-coverage [email or web address] Password [email or web address] 08/02/2013, 6:08 PM   LOS: 1 day

## 2013-08-02 NOTE — Evaluation (Signed)
Physical Therapy Evaluation Patient Details Name: Peter [name] MRN: [id number] DOB: 1951/04/14 Today's Date: 08/02/2013 Time: 1040-1055 PT Time Calculation (min): 15 min  PT Assessment / Plan / Recommendation History of Present Illness  Peter [name] is a 63 y.o. male with history of chronic alcoholism, cirrhosis of liver, ongoing tobacco abuse, COPD, hyperlipidemia, hypertension was brought to the ER after patient was found to be increasingly weak but patient's family. Patient states that he's been feeling weak over the last one week and has been having falls and difficulty walking. He has been drinking alcohol usually beer everyday throughout the day. In the ER patient was found to be nonfocal. CT head did not show anything acute. Labs reveal mild hyponatremia  Clinical Impression  Pt will benefit from PT to address deficits below    PT Assessment  Patient needs continued PT services    Follow Up Recommendations  No PT follow up    Does the patient have the potential to tolerate intense rehabilitation      Barriers to Discharge        Equipment Recommendations  None recommended by PT    Recommendations for Other Services     Frequency Min 3X/week    Precautions / Restrictions Precautions Precautions: Fall Restrictions Weight Bearing Restrictions: No   Pertinent Vitals/Pain       Mobility  Bed Mobility Overal bed mobility: Needs Assistance Bed Mobility: Supine to Sit;Sit to Supine Supine to sit: Supervision Sit to supine: Supervision General bed mobility comments: for safety Transfers Overall transfer level: Needs assistance Equipment used: Straight cane Transfers: Sit to/from Stand Sit to Stand: Min guard General transfer comment: cues for safe technique Ambulation/Gait Ambulation/Gait assistance: Min guard Ambulation Distance (Feet): 200 Feet Assistive device: Straight cane General Gait Details: cues for safety, negotiation of obstacles in hallway     Exercises     PT Diagnosis: Difficulty walking  PT Problem List: Decreased balance;Decreased mobility;Decreased activity tolerance PT Treatment Interventions: Gait training;Functional mobility training;Therapeutic activities;Patient/family education     PT Goals(Current goals can be found in the care plan section) Acute Rehab PT Goals Time For Goal Achievement: 08/09/13 Potential to Achieve Goals: Good  Visit Information  Last PT Received On: 08/02/13 Assistance Needed: +1 History of Present Illness: Peter [name] is a 63 y.o. male with history of chronic alcoholism, cirrhosis of liver, ongoing tobacco abuse, COPD, hyperlipidemia, hypertension was brought to the ER after patient was found to be increasingly weak but patient's family. Patient states that he's been feeling weak over the last one week and has been having falls and difficulty walking. He has been drinking alcohol usually beer everyday throughout the day. In the ER patient was found to be nonfocal. CT head did not show anything acute. Labs reveal mild hyponatremia       Prior [place] expects to be discharged to:: Private residence Living Arrangements: Other relatives (brother) Type of Home: House Home Access: Stairs to enter Technical brewer of Steps: 4 Entrance Stairs-Rails: None Home Layout: One level Home Equipment: Environmental consultant - 2 wheels;Cane - single point Additional Comments: most of day has assist if needed Prior Function Level of Independence: Independent with assistive device(s) Comments: pt reports using the [facility] just in case his legs get weak "to keep from falling" Communication Communication: No difficulties Dominant Hand: Left    Cognition  Cognition Arousal/Alertness: Awake/alert Behavior During Therapy: WFL for tasks assessed/performed Overall Cognitive Status: Within Functional Limits for tasks assessed  Extremity/Trunk Assessment Upper Extremity  Assessment Upper Extremity Assessment: Overall WFL for tasks assessed Lower Extremity Assessment Lower Extremity Assessment: Generalized weakness   Balance Balance Overall balance assessment: Needs assistance;History of Falls Sitting-balance support: No upper extremity supported Sitting balance-Leahy Scale: Good Standing balance support: During functional activity;Single extremity supported Standing balance-Leahy Scale: Fair High Level Balance Comments: min assist for balance with turns, LOB x 1 during gait with min to recover  End of Session PT - End of Session Activity Tolerance: Patient tolerated treatment well Patient left: in bed;with call bell/phone within reach;with family/visitor present Nurse Communication: Mobility status  GP     [facility] 08/02/2013, 12:30 PM

## 2013-08-03 LAB — COMPREHENSIVE METABOLIC PANEL
ALT: 13 U/L (ref 0–53)
AST: 21 U/L (ref 0–37)
Albumin: 3.4 g/dL — ABNORMAL LOW (ref 3.5–5.2)
Alkaline Phosphatase: 68 U/L (ref 39–117)
BUN: 9 mg/dL (ref 6–23)
CALCIUM: 8.9 mg/dL (ref 8.4–10.5)
CO2: 24 mEq/L (ref 19–32)
Chloride: 103 mEq/L (ref 96–112)
Creatinine, Ser: 0.86 mg/dL (ref 0.50–1.35)
GFR calc non Af Amer: >90 mL/min (ref >90)
GLUCOSE: 100 mg/dL — AB (ref 70–99)
POTASSIUM: 3.7 meq/L (ref 3.7–5.3)
SODIUM: 138 meq/L (ref 137–147)
TOTAL PROTEIN: 7.2 g/dL (ref 6.0–8.3)
Total Bilirubin: 0.3 mg/dL (ref 0.3–1.2)

## 2013-08-03 LAB — CBC
HCT: 29.6 % — ABNORMAL LOW (ref 39.0–52.0)
HEMOGLOBIN: 9.5 g/dL — AB (ref 13.0–17.0)
MCH: 27.4 pg (ref 26.0–34.0)
MCHC: 32.1 g/dL (ref 30.0–36.0)
MCV: 85.3 fL (ref 78.0–100.0)
Platelets: 195 10*3/uL (ref 150–400)
RBC: 3.47 MIL/uL — ABNORMAL LOW (ref 4.22–5.81)
RDW: 16.5 % — ABNORMAL HIGH (ref 11.5–15.5)
WBC: 4.8 10*3/uL (ref 4.0–10.5)

## 2013-08-03 LAB — URINE CULTURE: Colony Count: >=100000

## 2013-08-03 LAB — CLOSTRIDIUM DIFFICILE BY PCR: CDIFFPCR: NEGATIVE

## 2013-08-03 MED ORDER — CLOTRIMAZOLE 1 % EX CREA
TOPICAL_CREAM | Freq: Two times a day (BID) | CUTANEOUS | Status: DC
  Administered 2013-08-03 – 2013-08-04 (×3): via TOPICAL
  Filled 2013-08-03: qty 15

## 2013-08-03 NOTE — Progress Notes (Signed)
Clinical Social Work Department BRIEF PSYCHOSOCIAL ASSESSMENT 08/03/2013  Patient:  Peter [name], Peter [name]     Account Number:  [id number]     Admit date:  08/01/2013  Clinical Social Worker:  Earlie Server  Date/Time:  08/03/2013 10:00 AM  Referred by:  Physician  Date Referred:  08/03/2013 Referred for  Substance Abuse   Other Referral:   Interview type:  Patient Other interview type:    PSYCHOSOCIAL DATA Living Status:  FAMILY Admitted from facility:   Level of care:   Primary support name:  Dollie Primary support relationship to patient:  PARENT Degree of support available:   Adequate    CURRENT CONCERNS Current Concerns  Substance Abuse   Other Concerns:    SOCIAL WORK ASSESSMENT / PLAN CSW received referral in order to complete assessment due to patient's substance use. CSW reviewed chart and met with patient at bedside. CSW introduced myself and explained role.    Patient reports he lives with brother and requires additional assistance since he has had 3 strokes in the past. Patient reports he was independent and feels depressed that he is no longer able to do all the activities he was able to do prior to strokes. Patient reports that he is depressed about mobility status and the need to rely on others. Patient reports his eating habits are not affected but is difficult to sleep when he is depressed. Patient reports he isolates more often when depressed and does not enjoy being around others. Patient reports he gets "snappy" and irritable when depressed and has not motivation to complete any activities. Patient reports that he never experiences SI or HI and denies any previous suicide attempts. Patient denies any psychotic symptoms as well.    CSW and patient discussed alcohol use. Patient reports he has been drinking for over 40 years but does not feel that it is causing any harmful side affects because he does not get drunk. Patient reports that he drinks 2-3 beers about 3  times a week. Patient reports as long as he is not getting drunk then he is not doing any harm to his body. CSW explained that LT use of alcohol is harmful and encouraged MD to talk about effects of alcohol use with patient in more detail. Patient reports he has been to treatment in the past at [facility] for intensive outpatient therapy. Patient reports that he is not currently attending but when CSW asked why he had stopped patient reports he does not want to disclose this information. CSW inquired if it was regarding transportation but patient reports he has transportation and is going to consider going back for treatment.    CSW and patient discussed how depression and alcohol use were related and how patient could benefit from treatment. Patient refusing to see a psychiatrist and is not interested in therapy. Patient reports he will talk with family and possibly start attending IOP again but does not need any resources from CSW.    Per chart review, PT recommending PT follow up. CSW will continue to follow to support patient throughout hospital stay.   Assessment/plan status:  Referral to [employer] Other assessment/ plan:   SBIRT   Information/referral to community resources:   Patient refuses any resources for outpatient follow up in the community.    PATIENT'S/FAMILY'S RESPONSE TO PLAN OF CARE: Patient alert and oriented throughout assessment. Patient open to discussing consumption but is guarded when discussing treatment plans for substance use. Patient admits to depression but is not  open to treatment. Patient is aware that depression and alcohol use related but does not feel he needs treatment for depression. Patient is upset about loss of independence and wishes he had never had strokes in the past. Patient does feel that it is a good arrangement living with brother and is happy for support. Patient does not want resources but thanked CSW for time.       [place],  [place] [phone]

## 2013-08-03 NOTE — Progress Notes (Signed)
Patient ID: Peter [name], male   DOB: [birth year], 63 y.o.   MRN: [id number]  [facility] PROGRESS NOTE  Peter [name] KPT:[id number] DOB: 1951/02/10 DOA: 08/01/2013 PCP: No primary provider on file.  Brief narrative: 63 y.o. male with history of chronic alcoholism, cirrhosis of liver, ongoing tobacco abuse, COPD, hyperlipidemia, hypertension was brought to the ER after patient was found to be increasingly weak per patient's family. Patient states that he's been feeling weak over the last week and has been having difficulty walking. He has been drinking alcohol, usually beer everyday throughout the day. In the ER, CT head did not show anything acute. Labs reveal mild hyponatremia, elevated alcohol level. [facility] asked to admit for further evaluation.   Principal Problem:   Weakness - multifactorial: alcohol intoxication, poor oral intake/malnutrition and dehydration  - improving    Alcohol intoxication - continue supportive care, IVF as noted above - repeat alcohol level normal - watch inpatient today due to risk of ETOH withdrawal later today -counseled regarding this    Hyponatremia -improved with IVF, likely beer potomania    Cirrhosis -FU with GI, ETOH cessation    Anemia of chronic disease - stable    Diarrhea - C. Diff negative -IVF, supportive care  Consultants:  CSW Procedures/Studies: Ct Head Wo Contrast   08/01/2013   Stable CT of the head.  No acute findings.   Electronically Signed   By: Aletta Edouard M.D.   On: 08/01/2013 19:18  Antibiotics:  None  Code Status: Full Family Communication: Pt at bedside Disposition Plan: Home tomorrow if stable  HPI/Subjective: No events overnight.   Objective: Filed Vitals:   08/02/13 1942 08/02/13 2200 08/03/13 0821 08/03/13 0951  BP:  125/74  133/74  Pulse:  78  82  Temp:  98.6 F (37 C)    TempSrc:  Oral    Resp:  18    Height:      Weight:      SpO2: 97% 98% 97%     Intake/Output Summary (Last 24  hours) at 08/03/13 1301 Last data filed at 08/03/13 1016  Gross per 24 hour  Intake 2730.29 ml  Output   1800 ml  Net 930.29 ml    Exam:   General:  Pt is alert, follows commands appropriately, not in acute distress  Cardiovascular: Regular rhythm, tachycardic, S1/S2, no murmurs, no rubs, no gallops  Respiratory: Clear to auscultation bilaterally, no wheezing, no crackles, no rhonchi  Abdomen: Soft, non tender, non distended, bowel sounds present, no guarding  Extremities: No edema, pulses DP and PT palpable bilaterally, tinea corporis noted on legs  Neuro: Grossly nonfocal but tremors noted at rest   Data Reviewed: Basic Metabolic Panel:  Recent Labs Lab 08/01/13 1445 08/02/13 0520 08/03/13 0535  NA 128* 138 138  K 3.9 4.0 3.7  CL 90* 102 103  CO2 21 21 24   GLUCOSE 89 97 100*  BUN 9 9 9   CREATININE 0.80 0.86 0.86  CALCIUM 9.5 9.0 8.9   Liver Function Tests:  Recent Labs Lab 08/01/13 1445 08/02/13 0520 08/03/13 0535  AST 28 24 21   ALT 15 14 13   ALKPHOS 88 71 68  BILITOT 0.3 0.4 0.3  PROT 8.6* 7.2 7.2  ALBUMIN 4.2 3.6 3.4*    Recent Labs Lab 08/01/13 1900 08/02/13 0520  AMMONIA RESULTS UNAVAILABLE DUE TO INTERFERING SUBSTANCE 34   CBC:  Recent Labs Lab 08/01/13 1445 08/02/13 0520 08/03/13 0535  WBC 4.4 3.5* 4.8  NEUTROABS  2.1  --   --   HGB 9.7* 9.3* 9.5*  HCT 29.1* 27.7* 29.6*  MCV 83.4 82.9 85.3  PLT 199 200 195   Scheduled Meds: . acamprosate  333 mg Oral TID  . atorvastatin  80 mg Oral Q2200  . brimonidine  1 drop Both Eyes BID  . budesonide-formoterol  2 puff Inhalation BID  . clotrimazole   Topical BID  . ferrous sulfate  325 mg Oral Q breakfast  . folic acid  1 mg Oral Daily  . LORazepam  0-4 mg Intravenous Q6H   Followed by  . LORazepam  0-4 mg Intravenous Q12H  . multivitamin with minerals  1 tablet Oral Daily  . nicotine  7 mg Transdermal Q24H  . pantoprazole  40 mg Oral Daily  . propranolol  10 mg Oral TID  . sodium  chloride  3 mL Intravenous Q12H  . sodium chloride  3 mL Intravenous Q12H  . thiamine  100 mg Oral Daily   Or  . thiamine  100 mg Intravenous Daily  . traZODone  50 mg Oral QHS  . vitamin B-12  100 mcg Oral Daily   Continuous Infusions: . sodium chloride 100 mL/hr at 08/02/13 Valarie Merino, MD  [facility] Pager [phone]  If 7PM-7AM, please contact night-coverage [email or web address] Password [email or web address] 08/03/2013, 1:01 PM   LOS: 2 days

## 2013-08-03 NOTE — Plan of Care (Signed)
Problem: COPD GOLD Progrssion Goal: ABLE TO WEAN TO ROOM AIR Outcome: Completed/Met Date Met:  08/03/13 Pt has been on room air since admission. o2 wnl

## 2013-08-04 DIAGNOSIS — K7460 Unspecified cirrhosis of liver: Secondary | ICD-10-CM

## 2013-08-04 MED ORDER — CLOTRIMAZOLE 1 % EX CREA: TOPICAL_CREAM | Freq: Two times a day (BID) | CUTANEOUS | Status: DC

## 2013-08-04 NOTE — Progress Notes (Signed)
Pt refused to be set up on [email or web address] b/c he doesn't have a computer at home.

## 2013-08-04 NOTE — Progress Notes (Signed)
Received referral for [facility] Care Management services. However, patient has already been discharged. Will follow up with post hospital discharge call to offer services.  Marthenia Rolling, MSN, RN,BSN- [facility] [phone]

## 2013-08-28 NOTE — Discharge Summary (Signed)
Physician Discharge Summary  Peter [name] YIR:[id number] DOB: 17-Jun-1951 DOA: 08/01/2013  PCP: No primary provider on file.  Admit date: 08/01/2013 Discharge date: 08/04/2013  Time spent: 45 minutes  Recommendations for Outpatient Follow-up:  1. PCP in 1 week  Discharge Diagnoses:  Principal Problem:   Weakness Active Problems:   Hyponatremia   Alcohol intoxication   Cirrhosis   Anemia   Alcohol abuse   Tobacco use disorder    COPD   HTN  Discharge Condition:improved  Diet recommendation: low sodium  Filed Weights   08/01/13 2053  Weight: 69.9 kg (154 lb 1.6 oz)    History of present illness:  63 y.o. male with history of chronic alcoholism, cirrhosis of liver, ongoing tobacco abuse, COPD, hyperlipidemia, hypertension was brought to the ER after patient was found to be increasingly weak per patient's family. Patient states that he's been feeling weak over the last week and has been having difficulty walking. He has been drinking alcohol, usually beer everyday throughout the day. In the ER, CT head did not show anything acute. Labs reveal mild hyponatremia, elevated alcohol level.  Hospital Course:  Weakness  - multifactorial: alcohol intoxication, poor oral intake/malnutrition and dehydration  - improved with hydration, supportive care -Physical therapy eval completed  Alcohol intoxication  - treated with supportive care, IVF as noted above  - repeat alcohol level normal  - no evidence of ETOH withdrawal noted  -counseled regarding this   Hyponatremia  -improved with IVF, likely beer potomania   Cirrhosis  -FU with GI, ETOH cessation counseled  Anemia of chronic disease  - stable   Diarrhea  - C. Diff negative  -improved with supportive care   Discharge Exam: Filed Vitals:   08/04/13 0500  BP: 157/87  Pulse: 82  Temp: 98.7 F (37.1 C)  Resp: 16    General:AAOx3 Cardiovascular: S1S2/RRR Respiratory: CTAB  Discharge Instructions  Discharge  Orders   Future Orders Complete By Expires   Diet - low sodium heart healthy  As directed    Increase activity slowly  As directed        Medication List         acamprosate 333 MG tablet  Commonly known as:  CAMPRAL  TAKE 2 TABLETS BY MOUTH THREE TIMES A DAY     atorvastatin 80 MG tablet  Commonly known as:  LIPITOR  Take 1 tablet (80 mg total) by mouth daily.     brimonidine 0.2 % ophthalmic solution  Commonly known as:  ALPHAGAN  Place 1 drop into both eyes 2 (two) times daily.     budesonide-formoterol 80-4.5 MCG/ACT inhaler  Commonly known as:  SYMBICORT  Inhale 2 puffs into the lungs 2 (two) times daily.     budesonide-formoterol 80-4.5 MCG/ACT inhaler  Commonly known as:  SYMBICORT  Inhale 2 puffs into the lungs 2 (two) times daily. For COPD.     clotrimazole 1 % cream  Commonly known as:  LOTRIMIN  Apply topically 2 (two) times daily.     ferrous sulfate 325 (65 FE) MG tablet  Take 1 tablet (325 mg total) by mouth daily with breakfast. For low iron.     folic acid 1 MG tablet  Commonly known as:  FOLVITE  TAKE 1 TABLET BY MOUTH DAILY     multivitamin tablet  Take 1 tablet by mouth daily.     nicotine 7 mg/24hr patch  Commonly known as:  NICODERM CQ - dosed in mg/24 hr  Place 1  patch onto the skin daily.     pantoprazole 40 MG tablet  Commonly known as:  PROTONIX  TAKE 1 TABLET BY MOUTH EVERY DAY     PROAIR HFA 108 (90 BASE) MCG/ACT inhaler  Generic drug:  albuterol  INHALE 2 PUFFS BY MOUTH 4 TIMES DAILY     propranolol 10 MG tablet  Commonly known as:  INDERAL  Take 1 tablet (10 mg total) by mouth 3 (three) times daily.     thiamine 100 MG tablet  Commonly known as:  VITAMIN B-1  Take 1 tablet (100 mg total) by mouth daily. For vitamin B deficiency.     traZODone 50 MG tablet  Commonly known as:  DESYREL  Take 1 tablet (50 mg total) by mouth at bedtime.     vitamin B-12 100 MCG tablet  Commonly known as:  CYANOCOBALAMIN  Take 1 tablet (100  mcg total) by mouth daily. For low B12.     vitamin E 200 UNIT capsule  Take 1 capsule (200 Units total) by mouth daily.       Allergies  Allergen Reactions  . Poison Ivy Extract [Extract Of Poison Ivy] Rash       Follow-up Information   Follow up with PCP. Schedule an appointment as soon as possible for a visit in 1 week.       The results of significant diagnostics from this hospitalization (including imaging, microbiology, ancillary and laboratory) are listed below for reference.    Significant Diagnostic Studies: Ct Head Wo Contrast  08/01/2013   CLINICAL DATA:  Weakness and confusion.  EXAM: CT HEAD WITHOUT CONTRAST  TECHNIQUE: Contiguous axial images were obtained from the base of the skull through the vertex without intravenous contrast.  COMPARISON:  CT HEAD W/O CM dated 06/29/2013  FINDINGS: There is stable diffuse atrophy and mild small vessel disease. The brain demonstrates no evidence of hemorrhage, infarction, edema, mass effect, extra-axial fluid collection, hydrocephalus or mass lesion. The skull is unremarkable.  IMPRESSION: Stable CT of the head.  No acute findings.   Electronically Signed   By: Aletta Edouard M.D.   On: 08/01/2013 19:18    Microbiology: No results found for this or any previous visit (from the past 240 hour(s)).   Labs: Basic Metabolic Panel: No results found for this basename: NA, K, CL, CO2, GLUCOSE, BUN, CREATININE, CALCIUM, MG, PHOS,  in the last 168 hours Liver Function Tests: No results found for this basename: AST, ALT, ALKPHOS, BILITOT, PROT, ALBUMIN,  in the last 168 hours No results found for this basename: LIPASE, AMYLASE,  in the last 168 hours No results found for this basename: AMMONIA,  in the last 168 hours CBC: No results found for this basename: WBC, NEUTROABS, HGB, HCT, MCV, PLT,  in the last 168 hours Cardiac Enzymes: No results found for this basename: CKTOTAL, CKMB, CKMBINDEX, TROPONINI,  in the last 168 hours BNP: BNP  (last 3 results) No results found for this basename: PROBNP,  in the last 8760 hours CBG: No results found for this basename: GLUCAP,  in the last 168 hours     Signed:  Titania Gault  [facility] 08/28/2013, 8:07 PM

## 2013-10-06 ENCOUNTER — Ambulatory Visit: Payer: Self-pay | Admitting: Internal Medicine

## 2013-10-25 ENCOUNTER — Emergency Department (HOSPITAL_COMMUNITY): Payer: PRIVATE HEALTH INSURANCE

## 2013-10-25 ENCOUNTER — Inpatient Hospital Stay (HOSPITAL_COMMUNITY)
Admission: EM | Admit: 2013-10-25 | Discharge: 2013-11-04 | DRG: 871 | Disposition: A | Discharge status: Skilled Nursing Facility | Payer: PRIVATE HEALTH INSURANCE | Attending: Internal Medicine | Admitting: Internal Medicine

## 2013-10-25 DIAGNOSIS — F329 Major depressive disorder, single episode, unspecified: Secondary | ICD-10-CM

## 2013-10-25 DIAGNOSIS — N390 Urinary tract infection, site not specified: Secondary | ICD-10-CM

## 2013-10-25 DIAGNOSIS — I498 Other specified cardiac arrhythmias: Secondary | ICD-10-CM | POA: Diagnosis present

## 2013-10-25 DIAGNOSIS — Z720 Tobacco use: Secondary | ICD-10-CM

## 2013-10-25 DIAGNOSIS — R509 Fever, unspecified: Secondary | ICD-10-CM

## 2013-10-25 DIAGNOSIS — D638 Anemia in other chronic diseases classified elsewhere: Secondary | ICD-10-CM | POA: Diagnosis present

## 2013-10-25 DIAGNOSIS — I509 Heart failure, unspecified: Secondary | ICD-10-CM | POA: Diagnosis present

## 2013-10-25 DIAGNOSIS — E872 Acidosis, unspecified: Secondary | ICD-10-CM | POA: Diagnosis present

## 2013-10-25 DIAGNOSIS — N12 Tubulo-interstitial nephritis, not specified as acute or chronic: Secondary | ICD-10-CM | POA: Diagnosis present

## 2013-10-25 DIAGNOSIS — G9340 Encephalopathy, unspecified: Secondary | ICD-10-CM | POA: Diagnosis present

## 2013-10-25 DIAGNOSIS — F10931 Alcohol use, unspecified with withdrawal delirium: Secondary | ICD-10-CM

## 2013-10-25 DIAGNOSIS — K7030 Alcoholic cirrhosis of liver without ascites: Secondary | ICD-10-CM | POA: Diagnosis present

## 2013-10-25 DIAGNOSIS — J9589 Other postprocedural complications and disorders of respiratory system, not elsewhere classified: Secondary | ICD-10-CM | POA: Diagnosis present

## 2013-10-25 DIAGNOSIS — J690 Pneumonitis due to inhalation of food and vomit: Secondary | ICD-10-CM | POA: Diagnosis present

## 2013-10-25 DIAGNOSIS — F32A Depression, unspecified: Secondary | ICD-10-CM | POA: Diagnosis present

## 2013-10-25 DIAGNOSIS — I10 Essential (primary) hypertension: Secondary | ICD-10-CM | POA: Diagnosis present

## 2013-10-25 DIAGNOSIS — R5381 Other malaise: Secondary | ICD-10-CM | POA: Diagnosis not present

## 2013-10-25 DIAGNOSIS — I5020 Unspecified systolic (congestive) heart failure: Secondary | ICD-10-CM

## 2013-10-25 DIAGNOSIS — J80 Acute respiratory distress syndrome: Secondary | ICD-10-CM

## 2013-10-25 DIAGNOSIS — Z833 Family history of diabetes mellitus: Secondary | ICD-10-CM

## 2013-10-25 DIAGNOSIS — E871 Hypo-osmolality and hyponatremia: Secondary | ICD-10-CM | POA: Diagnosis present

## 2013-10-25 DIAGNOSIS — R6520 Severe sepsis without septic shock: Secondary | ICD-10-CM

## 2013-10-25 DIAGNOSIS — Z8673 Personal history of transient ischemic attack (TIA), and cerebral infarction without residual deficits: Secondary | ICD-10-CM

## 2013-10-25 DIAGNOSIS — I5022 Chronic systolic (congestive) heart failure: Secondary | ICD-10-CM | POA: Diagnosis present

## 2013-10-25 DIAGNOSIS — K921 Melena: Secondary | ICD-10-CM | POA: Diagnosis present

## 2013-10-25 DIAGNOSIS — E119 Type 2 diabetes mellitus without complications: Secondary | ICD-10-CM | POA: Diagnosis present

## 2013-10-25 DIAGNOSIS — J1100 Influenza due to unidentified influenza virus with unspecified type of pneumonia: Secondary | ICD-10-CM | POA: Diagnosis present

## 2013-10-25 DIAGNOSIS — E785 Hyperlipidemia, unspecified: Secondary | ICD-10-CM | POA: Diagnosis present

## 2013-10-25 DIAGNOSIS — E876 Hypokalemia: Secondary | ICD-10-CM | POA: Diagnosis present

## 2013-10-25 DIAGNOSIS — D509 Iron deficiency anemia, unspecified: Secondary | ICD-10-CM | POA: Diagnosis present

## 2013-10-25 DIAGNOSIS — Z803 Family history of malignant neoplasm of breast: Secondary | ICD-10-CM

## 2013-10-25 DIAGNOSIS — F10929 Alcohol use, unspecified with intoxication, unspecified: Secondary | ICD-10-CM

## 2013-10-25 DIAGNOSIS — Z8249 Family history of ischemic heart disease and other diseases of the circulatory system: Secondary | ICD-10-CM

## 2013-10-25 DIAGNOSIS — J438 Other emphysema: Secondary | ICD-10-CM | POA: Diagnosis present

## 2013-10-25 DIAGNOSIS — J9600 Acute respiratory failure, unspecified whether with hypoxia or hypercapnia: Secondary | ICD-10-CM | POA: Diagnosis present

## 2013-10-25 DIAGNOSIS — I428 Other cardiomyopathies: Secondary | ICD-10-CM | POA: Diagnosis present

## 2013-10-25 DIAGNOSIS — A419 Sepsis, unspecified organism: Secondary | ICD-10-CM

## 2013-10-25 DIAGNOSIS — D649 Anemia, unspecified: Secondary | ICD-10-CM

## 2013-10-25 DIAGNOSIS — F10231 Alcohol dependence with withdrawal delirium: Secondary | ICD-10-CM | POA: Diagnosis present

## 2013-10-25 DIAGNOSIS — F1020 Alcohol dependence, uncomplicated: Secondary | ICD-10-CM | POA: Diagnosis present

## 2013-10-25 DIAGNOSIS — K7460 Unspecified cirrhosis of liver: Secondary | ICD-10-CM

## 2013-10-25 DIAGNOSIS — F3289 Other specified depressive episodes: Secondary | ICD-10-CM | POA: Diagnosis present

## 2013-10-25 DIAGNOSIS — F17200 Nicotine dependence, unspecified, uncomplicated: Secondary | ICD-10-CM | POA: Diagnosis present

## 2013-10-25 DIAGNOSIS — E46 Unspecified protein-calorie malnutrition: Secondary | ICD-10-CM | POA: Diagnosis present

## 2013-10-25 DIAGNOSIS — IMO0002 Reserved for concepts with insufficient information to code with codable children: Secondary | ICD-10-CM

## 2013-10-25 DIAGNOSIS — J189 Pneumonia, unspecified organism: Secondary | ICD-10-CM | POA: Diagnosis present

## 2013-10-25 DIAGNOSIS — R55 Syncope and collapse: Secondary | ICD-10-CM

## 2013-10-25 DIAGNOSIS — J449 Chronic obstructive pulmonary disease, unspecified: Secondary | ICD-10-CM

## 2013-10-25 DIAGNOSIS — F1010 Alcohol abuse, uncomplicated: Secondary | ICD-10-CM

## 2013-10-25 DIAGNOSIS — R531 Weakness: Secondary | ICD-10-CM

## 2013-10-25 LAB — URINE MICROSCOPIC-ADD ON

## 2013-10-25 LAB — URINALYSIS, ROUTINE W REFLEX MICROSCOPIC
Bilirubin Urine: NEGATIVE
Glucose, UA: NEGATIVE mg/dL
Ketones, ur: 15 mg/dL — AB
Nitrite: POSITIVE — AB
Protein, ur: 30 mg/dL — AB
SPECIFIC GRAVITY, URINE: 1.011 (ref 1.005–1.030)
UROBILINOGEN UA: 0.2 mg/dL (ref 0.0–1.0)
pH: 5.5 (ref 5.0–8.0)

## 2013-10-25 LAB — TROPONIN I

## 2013-10-25 LAB — CBC WITH DIFFERENTIAL/PLATELET
BASOS ABS: 0 10*3/uL (ref 0.0–0.1)
BASOS PCT: 0 % (ref 0–1)
EOS PCT: 0 % (ref 0–5)
Eosinophils Absolute: 0 10*3/uL (ref 0.0–0.7)
HEMATOCRIT: 28.1 % — AB (ref 39.0–52.0)
Hemoglobin: 9.7 g/dL — ABNORMAL LOW (ref 13.0–17.0)
Lymphocytes Relative: 5 % — ABNORMAL LOW (ref 12–46)
Lymphs Abs: 0.7 10*3/uL (ref 0.7–4.0)
MCH: 27.6 pg (ref 26.0–34.0)
MCHC: 34.5 g/dL (ref 30.0–36.0)
MCV: 80.1 fL (ref 78.0–100.0)
MONO ABS: 0.9 10*3/uL (ref 0.1–1.0)
Monocytes Relative: 7 % (ref 3–12)
Neutro Abs: 11.8 10*3/uL — ABNORMAL HIGH (ref 1.7–7.7)
Neutrophils Relative %: 88 % — ABNORMAL HIGH (ref 43–77)
Platelets: 170 10*3/uL (ref 150–400)
RBC: 3.51 MIL/uL — ABNORMAL LOW (ref 4.22–5.81)
RDW: 16.7 % — AB (ref 11.5–15.5)
WBC: 13.4 10*3/uL — ABNORMAL HIGH (ref 4.0–10.5)

## 2013-10-25 LAB — COMPREHENSIVE METABOLIC PANEL
ALBUMIN: 3.3 g/dL — AB (ref 3.5–5.2)
ALT: 30 U/L (ref 0–53)
AST: 59 U/L — ABNORMAL HIGH (ref 0–37)
Alkaline Phosphatase: 105 U/L (ref 39–117)
BUN: 7 mg/dL (ref 6–23)
CALCIUM: 8.8 mg/dL (ref 8.4–10.5)
CO2: 13 meq/L — AB (ref 19–32)
CREATININE: 0.88 mg/dL (ref 0.50–1.35)
Chloride: 87 mEq/L — ABNORMAL LOW (ref 96–112)
GFR calc Af Amer: >90 mL/min (ref >90)
Glucose, Bld: 70 mg/dL (ref 70–99)
Potassium: 5 mEq/L (ref 3.7–5.3)
Sodium: 123 mEq/L — ABNORMAL LOW (ref 137–147)
Total Bilirubin: 0.5 mg/dL (ref 0.3–1.2)
Total Protein: 7.8 g/dL (ref 6.0–8.3)

## 2013-10-25 LAB — ETHANOL: Alcohol, Ethyl (B): 40 mg/dL — ABNORMAL HIGH (ref 0–11)

## 2013-10-25 LAB — I-STAT CG4 LACTIC ACID, ED: Lactic Acid, Venous: 3.5 mmol/L — ABNORMAL HIGH (ref 0.5–2.2)

## 2013-10-25 LAB — EXPECTORATED SPUTUM ASSESSMENT W REFEX TO RESP CULTURE

## 2013-10-25 LAB — LACTIC ACID, PLASMA: Lactic Acid, Venous: 2 mmol/L (ref 0.5–2.2)

## 2013-10-25 LAB — EXPECTORATED SPUTUM ASSESSMENT W GRAM STAIN, RFLX TO RESP C: Special Requests: NORMAL

## 2013-10-25 LAB — AMMONIA: AMMONIA: 26 umol/L (ref 11–60)

## 2013-10-25 MED ORDER — SODIUM CHLORIDE 0.9 % IV SOLN
1000.0000 mL | INTRAVENOUS | Status: DC
  Administered 2013-10-25 – 2013-10-27 (×3): 1000 mL via INTRAVENOUS

## 2013-10-25 MED ORDER — ATORVASTATIN CALCIUM 80 MG PO TABS
80.0000 mg | ORAL_TABLET | Freq: Every day | ORAL | Status: DC
  Administered 2013-10-26 – 2013-10-27 (×2): 80 mg via ORAL
  Filled 2013-10-25 (×4): qty 1

## 2013-10-25 MED ORDER — BRIMONIDINE TARTRATE 0.2 % OP SOLN
1.0000 [drp] | Freq: Two times a day (BID) | OPHTHALMIC | Status: DC
Start: 2013-10-25 — End: 2013-11-04
  Administered 2013-10-26 – 2013-11-04 (×19): 1 [drp] via OPHTHALMIC
  Filled 2013-10-25 (×3): qty 5

## 2013-10-25 MED ORDER — VITAMIN B-1 100 MG PO TABS
100.0000 mg | ORAL_TABLET | Freq: Every day | ORAL | Status: DC
  Administered 2013-10-26 – 2013-10-27 (×2): 100 mg via ORAL
  Filled 2013-10-25 (×3): qty 1

## 2013-10-25 MED ORDER — ALBUTEROL SULFATE HFA 108 (90 BASE) MCG/ACT IN AERS
2.0000 | INHALATION_SPRAY | Freq: Four times a day (QID) | RESPIRATORY_TRACT | Status: DC
  Administered 2013-10-25: 2 via RESPIRATORY_TRACT
  Filled 2013-10-25: qty 6.7

## 2013-10-25 MED ORDER — THIAMINE HCL 100 MG/ML IJ SOLN
Freq: Once | INTRAVENOUS | Status: AC
  Administered 2013-10-26: via INTRAVENOUS
  Filled 2013-10-25 (×2): qty 1000

## 2013-10-25 MED ORDER — PANTOPRAZOLE SODIUM 40 MG PO TBEC: 40.0000 mg | DELAYED_RELEASE_TABLET | Freq: Every day | ORAL | Status: DC

## 2013-10-25 MED ORDER — DEXTROSE 5 % IV SOLN
1.0000 g | Freq: Two times a day (BID) | INTRAVENOUS | Status: DC
  Administered 2013-10-26: 1 g via INTRAVENOUS
  Filled 2013-10-25: qty 1

## 2013-10-25 MED ORDER — TRAZODONE HCL 50 MG PO TABS
50.0000 mg | ORAL_TABLET | Freq: Every day | ORAL | Status: DC
  Administered 2013-10-26 – 2013-10-27 (×2): 50 mg via ORAL
  Filled 2013-10-25 (×3): qty 1

## 2013-10-25 MED ORDER — LORAZEPAM 2 MG/ML IJ SOLN
INTRAMUSCULAR | Status: AC
  Filled 2013-10-25: qty 1

## 2013-10-25 MED ORDER — HYDROCODONE-ACETAMINOPHEN 5-325 MG PO TABS: 1.0000 | ORAL_TABLET | Freq: Four times a day (QID) | ORAL | Status: DC | PRN

## 2013-10-25 MED ORDER — VITAMIN B-12 100 MCG PO TABS
100.0000 ug | ORAL_TABLET | Freq: Every day | ORAL | Status: DC
  Administered 2013-10-26 – 2013-10-27 (×2): 100 ug via ORAL
  Filled 2013-10-25 (×4): qty 1

## 2013-10-25 MED ORDER — ACETAMINOPHEN 650 MG RE SUPP
650.0000 mg | Freq: Once | RECTAL | Status: AC
  Administered 2013-10-25: 650 mg via RECTAL

## 2013-10-25 MED ORDER — LORAZEPAM 2 MG/ML IJ SOLN
2.0000 mg | INTRAMUSCULAR | Status: DC | PRN
  Administered 2013-10-26 – 2013-10-28 (×13): 2 mg via INTRAVENOUS
  Filled 2013-10-25 (×13): qty 1

## 2013-10-25 MED ORDER — VANCOMYCIN HCL IN DEXTROSE 1-5 GM/200ML-% IV SOLN
1000.0000 mg | Freq: Once | INTRAVENOUS | Status: AC
  Administered 2013-10-25: 1000 mg via INTRAVENOUS
  Filled 2013-10-25: qty 200

## 2013-10-25 MED ORDER — ACETAMINOPHEN 650 MG RE SUPP
650.0000 mg | RECTAL | Status: DC | PRN
  Administered 2013-10-25 – 2013-10-26 (×2): 650 mg via RECTAL
  Filled 2013-10-25 (×3): qty 1

## 2013-10-25 MED ORDER — PROPRANOLOL HCL 10 MG PO TABS
10.0000 mg | ORAL_TABLET | Freq: Three times a day (TID) | ORAL | Status: DC
  Administered 2013-10-26 – 2013-10-27 (×6): 10 mg via ORAL
  Filled 2013-10-25 (×10): qty 1

## 2013-10-25 MED ORDER — HYDROXYZINE HCL 25 MG PO TABS
25.0000 mg | ORAL_TABLET | Freq: Three times a day (TID) | ORAL | Status: DC | PRN
  Filled 2013-10-25: qty 1

## 2013-10-25 MED ORDER — ADULT MULTIVITAMIN W/MINERALS CH
1.0000 | ORAL_TABLET | Freq: Every day | ORAL | Status: DC
  Administered 2013-10-26 – 2013-10-27 (×2): 1 via ORAL
  Filled 2013-10-25 (×4): qty 1

## 2013-10-25 MED ORDER — VANCOMYCIN HCL IN DEXTROSE 750-5 MG/150ML-% IV SOLN
750.0000 mg | Freq: Three times a day (TID) | INTRAVENOUS | Status: DC
  Administered 2013-10-26 – 2013-10-30 (×13): 750 mg via INTRAVENOUS
  Filled 2013-10-25 (×19): qty 150

## 2013-10-25 MED ORDER — FERROUS SULFATE 325 (65 FE) MG PO TABS
325.0000 mg | ORAL_TABLET | Freq: Every day | ORAL | Status: DC
  Administered 2013-10-26 – 2013-10-28 (×3): 325 mg via ORAL
  Filled 2013-10-25 (×4): qty 1

## 2013-10-25 MED ORDER — SODIUM CHLORIDE 0.9 % IV SOLN
1000.0000 mL | Freq: Once | INTRAVENOUS | Status: AC
  Administered 2013-10-25: 1000 mL via INTRAVENOUS

## 2013-10-25 MED ORDER — VITAMIN E 45 MG (100 UNIT) PO CAPS
200.0000 [IU] | ORAL_CAPSULE | Freq: Every day | ORAL | Status: DC
  Administered 2013-10-26 – 2013-10-27 (×2): 200 [IU] via ORAL
  Filled 2013-10-25 (×3): qty 2

## 2013-10-25 MED ORDER — CEFEPIME HCL 1 G IJ SOLR
1.0000 g | Freq: Two times a day (BID) | INTRAMUSCULAR | Status: DC
  Filled 2013-10-25: qty 1

## 2013-10-25 MED ORDER — BUDESONIDE-FORMOTEROL FUMARATE 80-4.5 MCG/ACT IN AERO
2.0000 | INHALATION_SPRAY | Freq: Two times a day (BID) | RESPIRATORY_TRACT | Status: DC
  Administered 2013-10-26 – 2013-10-27 (×3): 2 via RESPIRATORY_TRACT
  Filled 2013-10-25: qty 6.9

## 2013-10-25 MED ORDER — ACAMPROSATE CALCIUM 333 MG PO TBEC
666.0000 mg | DELAYED_RELEASE_TABLET | Freq: Three times a day (TID) | ORAL | Status: DC
  Administered 2013-10-26 – 2013-10-28 (×7): 666 mg via ORAL
  Filled 2013-10-25 (×10): qty 2

## 2013-10-25 MED ORDER — DEXTROSE 5 % IV SOLN
2.0000 g | Freq: Once | INTRAVENOUS | Status: AC
  Administered 2013-10-25: 2 g via INTRAVENOUS

## 2013-10-25 MED ORDER — FOLIC ACID 1 MG PO TABS
1.0000 mg | ORAL_TABLET | Freq: Every day | ORAL | Status: DC
  Administered 2013-10-26 – 2013-10-27 (×2): 1 mg via ORAL
  Filled 2013-10-25 (×3): qty 1

## 2013-10-25 NOTE — ED Notes (Signed)
Attempted report x1. 

## 2013-10-25 NOTE — ED Provider Notes (Signed)
CSN: [id number]     Arrival date & time 10/25/13  1805 History   First MD Initiated Contact with Patient 10/25/13 1823     Chief Complaint  Patient presents with  . Weakness   Level V caveat unstable vitals signs. History is obtained from EMS and from patient's family member and caregiver and from patient  (Consider location/radiation/quality/duration/timing/severity/associated sxs/prior Treatment) HPI Generalized weakness onset today. Family members found him slumped over today. Patient admits to coughing for several days, coughing up white sputum today. He denies shortness of breath. No other associated symptoms. No treatment prior to coming here. Past Medical History  Diagnosis Date  . Hypertension   . Hyperlipemia   . Emphysema   . Stroke   . Chronic pain   . Cirrhosis of liver   . Fall   . Dental injury   . Bone spur of other site     Left Foot  . Impaired mobility   . Total self care deficit   . Pneumonia   . Emphysema   . ETOH abuse   . Elevated LFTs   . Arthritis   . Anxiety   . Sinusitis   . Anemia     iron def.  . Hard of hearing   . Diabetes mellitus without complication   . COPD (chronic obstructive pulmonary disease)   . Depression   . Glaucoma   . Emphysema of lung   . Cataract    Past Surgical History  Procedure Laterality Date  . Colonoscopy  01/06/2012    Procedure: COLONOSCOPY;  Surgeon: Beryle Beams, MD;  Location: [facility] ENDOSCOPY;  Service: Endoscopy;  Laterality: N/A;  . Esophagogastroduodenoscopy  01/06/2012    Procedure: ESOPHAGOGASTRODUODENOSCOPY (EGD);  Surgeon: Beryle Beams, MD;  Location: [facility] ENDOSCOPY;  Service: Endoscopy;  Laterality: N/A;  . Esophagogastroduodenoscopy  01/09/2012    Procedure: ESOPHAGOGASTRODUODENOSCOPY (EGD);  Surgeon: Beryle Beams, MD;  Location: [facility] ENDOSCOPY;  Service: Endoscopy;  Laterality: N/A;  . Multiple extractions with alveoloplasty  03/29/2012    Procedure: MULTIPLE EXTRACION WITH ALVEOLOPLASTY;  Surgeon: Gae Bon, DDS;  Location: [place];  Service: Oral Surgery;  Laterality: Bilateral;  . Bone spur  2013    left spur  . Cataract extraction w/ intraocular lens implant    . Mouth surgery    . Colonoscopy     Family History  Problem Relation Age of Onset  . Breast cancer Mother   . Diabetes Maternal Aunt   . Heart disease Sister   . Heart disease Maternal Aunt    History  Substance Use Topics  . Smoking status: Current Every Day Smoker -- 2.00 packs/day for 30 years    Types: Cigarettes  . Smokeless tobacco: Never Used  . Alcohol Use: 2.4 oz/week    4 Cans of beer per week     Comment: per pt four  40oz daily beer    Review of Systems  Unable to perform ROS: Unstable vital signs  Respiratory: Positive for cough.        Productive cough  Neurological: Positive for weakness.       Generalized weakness      Allergies  Poison ivy extract  Home Medications   Prior to Admission medications   Medication Sig Start Date End Date Taking? Authorizing Provider  acamprosate (CAMPRAL) 333 MG tablet TAKE 2 TABLETS BY MOUTH THREE TIMES A DAY 03/29/13   Theodis Blaze, MD  atorvastatin (LIPITOR) 80 MG tablet Take 1 tablet (  80 mg total) by mouth daily. 02/16/13   Angelica Chessman, MD  brimonidine (ALPHAGAN) 0.2 % ophthalmic solution Place 1 drop into both eyes 2 (two) times daily.    Historical Provider, MD  budesonide-formoterol (SYMBICORT) 80-4.5 MCG/ACT inhaler Inhale 2 puffs into the lungs 2 (two) times daily. For COPD. 02/04/12 04/26/13  Nena Polio, PA-C  budesonide-formoterol (SYMBICORT) 80-4.5 MCG/ACT inhaler Inhale 2 puffs into the lungs 2 (two) times daily.    Historical Provider, MD  clotrimazole (LOTRIMIN) 1 % cream Apply topically 2 (two) times daily. 08/04/13   Domenic Polite, MD  ferrous sulfate 325 (65 FE) MG tablet Take 1 tablet (325 mg total) by mouth daily with breakfast. For low iron. 02/16/13   Angelica Chessman, MD  folic acid (FOLVITE) 1 MG tablet TAKE 1 TABLET BY MOUTH  DAILY 03/26/13   Theodis Blaze, MD  Multiple Vitamin (MULTIVITAMIN) tablet Take 1 tablet by mouth daily. 02/16/13   Angelica Chessman, MD  nicotine (NICODERM CQ - DOSED IN MG/24 HR) 7 mg/24hr patch Place 1 patch onto the skin daily. 05/25/12   Waylan Boga, NP  pantoprazole (PROTONIX) 40 MG tablet TAKE 1 TABLET BY MOUTH EVERY DAY 05/27/13   Theodis Blaze, MD  PROAIR HFA 108 [phone] BASE) MCG/ACT inhaler INHALE 2 PUFFS BY MOUTH 4 TIMES DAILY 03/26/13   Theodis Blaze, MD  propranolol (INDERAL) 10 MG tablet Take 1 tablet (10 mg total) by mouth 3 (three) times daily. 06/07/13   Thurnell Lose, MD  thiamine (VITAMIN B-1) 100 MG tablet Take 1 tablet (100 mg total) by mouth daily. For vitamin B deficiency. 02/16/13   Angelica Chessman, MD  traZODone (DESYREL) 50 MG tablet Take 1 tablet (50 mg total) by mouth at bedtime. 02/16/13   Angelica Chessman, MD  vitamin B-12 (CYANOCOBALAMIN) 100 MCG tablet Take 1 tablet (100 mcg total) by mouth daily. For low B12. 02/16/13   Angelica Chessman, MD  vitamin E 200 UNIT capsule Take 1 capsule (200 Units total) by mouth daily. 02/16/13   Angelica Chessman, MD   SpO2 93% Physical Exam  Nursing note and vitals reviewed. Constitutional:  Ill-appearing, tremulous  HENT:  Head: Normocephalic and atraumatic.  Eyes: Conjunctivae are normal. Pupils are equal, round, and reactive to light.  Neck: Neck supple. No tracheal deviation present. No thyromegaly present.  Cardiovascular:  No murmur heard. Tachycardic  Pulmonary/Chest: Effort normal.  Diffuse coarse rhonchi  Abdominal: Soft. Bowel sounds are normal. He exhibits no distension. There is no tenderness.  Musculoskeletal: Normal range of motion. He exhibits no edema and no tenderness.  Neurological: He is alert. No cranial nerve deficit. Coordination normal.  Moves all extremities follows simple commands   Skin: Skin is warm and dry. No rash noted.  Psychiatric: He has a normal mood and affect.    ED Course  Procedures  (including critical care time) Labs Review Labs Reviewed - No data to display  Imaging Review No results found.   EKG Interpretation None     9:10 PM patient remains tachycardic and tremulous. After treatment with intravenous fluids and antibiotics. A third saline bolus of 1 L ordered to total of 3 L. Continues to be ill appearing He is alert and talkative. Glasgow Coma Score 15. Results for orders placed during the hospital encounter of 10/25/13  CULTURE, EXPECTORATED SPUTUM-ASSESSMENT      Result Value Ref Range   Specimen Description SPUTUM     Special Requests Normal     Sputum evaluation  Value: THIS SPECIMEN IS ACCEPTABLE. RESPIRATORY CULTURE REPORT TO FOLLOW.   Report Status 10/25/2013 FINAL    LACTIC ACID, PLASMA      Result Value Ref Range   Lactic Acid, Venous 2.0  0.5 - 2.2 mmol/L  ETHANOL      Result Value Ref Range   Alcohol, Ethyl (B) 40 (*) 0 - 11 mg/dL  CBC WITH DIFFERENTIAL      Result Value Ref Range   WBC 13.4 (*) 4.0 - 10.5 K/uL   RBC 3.51 (*) 4.22 - 5.81 MIL/uL   Hemoglobin 9.7 (*) 13.0 - 17.0 g/dL   HCT 28.1 (*) 39.0 - 52.0 %   MCV 80.1  78.0 - 100.0 fL   MCH 27.6  26.0 - 34.0 pg   MCHC 34.5  30.0 - 36.0 g/dL   RDW 16.7 (*) 11.5 - 15.5 %   Platelets 170  150 - 400 K/uL   Neutrophils Relative % 88 (*) 43 - 77 %   Neutro Abs 11.8 (*) 1.7 - 7.7 K/uL   Lymphocytes Relative 5 (*) 12 - 46 %   Lymphs Abs 0.7  0.7 - 4.0 K/uL   Monocytes Relative 7  3 - 12 %   Monocytes Absolute 0.9  0.1 - 1.0 K/uL   Eosinophils Relative 0  0 - 5 %   Eosinophils Absolute 0.0  0.0 - 0.7 K/uL   Basophils Relative 0  0 - 1 %   Basophils Absolute 0.0  0.0 - 0.1 K/uL  COMPREHENSIVE METABOLIC PANEL      Result Value Ref Range   Sodium 123 (*) 137 - 147 mEq/L   Potassium 5.0  3.7 - 5.3 mEq/L   Chloride 87 (*) 96 - 112 mEq/L   CO2 13 (*) 19 - 32 mEq/L   Glucose, Bld 70  70 - 99 mg/dL   BUN 7  6 - 23 mg/dL   Creatinine, Ser 0.88  0.50 - 1.35 mg/dL   Calcium 8.8  8.4 -  10.5 mg/dL   Total Protein 7.8  6.0 - 8.3 g/dL   Albumin 3.3 (*) 3.5 - 5.2 g/dL   AST 59 (*) 0 - 37 U/L   ALT 30  0 - 53 U/L   Alkaline Phosphatase 105  39 - 117 U/L   Total Bilirubin 0.5  0.3 - 1.2 mg/dL   GFR calc non Af Amer >90  >90 mL/min   GFR calc Af Amer >90  >90 mL/min  URINALYSIS, ROUTINE W REFLEX MICROSCOPIC      Result Value Ref Range   Color, Urine YELLOW  YELLOW   APPearance TURBID (*) CLEAR   Specific Gravity, Urine 1.011  1.005 - 1.030   pH 5.5  5.0 - 8.0   Glucose, UA NEGATIVE  NEGATIVE mg/dL   Hgb urine dipstick MODERATE (*) NEGATIVE   Bilirubin Urine NEGATIVE  NEGATIVE   Ketones, ur 15 (*) NEGATIVE mg/dL   Protein, ur 30 (*) NEGATIVE mg/dL   Urobilinogen, UA 0.2  0.0 - 1.0 mg/dL   Nitrite POSITIVE (*) NEGATIVE   Leukocytes, UA LARGE (*) NEGATIVE  URINE MICROSCOPIC-ADD ON      Result Value Ref Range   Squamous Epithelial / LPF MANY (*) RARE   WBC, UA TOO NUMEROUS TO COUNT  <3 WBC/hpf   RBC / HPF 3-6  <3 RBC/hpf   Bacteria, UA MANY (*) RARE  I-STAT CG4 LACTIC ACID, ED      Result Value Ref Range   Lactic  Acid, Venous 3.50 (*) 0.5 - 2.2 mmol/L   Dg Chest Port 1 View  (if Code Sepsis Called)  10/25/2013   CLINICAL DATA:  WEAKNESS  EXAM: PORTABLE CHEST - 1 VIEW  COMPARISON:  DG CHEST 2 VIEW dated 04/06/2013  FINDINGS: The cardiac silhouette is within normal limits. Aorta is tortuous and ectatic. Atherosclerotic calcifications are appreciated within the aorta. Diffuse increased density is appreciated within the medial and lateral segments of the right middle lobe. No further focal regions of consolidation or focal infiltrates. The osseous structures unremarkable.  IMPRESSION: Right middle lobe infiltrate.   Electronically Signed   By: Margaree Mackintosh M.D.   On: 10/25/2013 19:05    MDM  Code sepsis called based on heart rate and respiratory rate and suspected source of pneumonia Final diagnoses:  None   in light of past hospitalization January 2015 we will treat for  healthcare associated pneumonia. Patient needs to be watched for alcohol withdrawal. I spoke with Dr. Ernestina Patches who valuate patient in the emergency department. Diagnosis #1 severe sepsis #2 healthcare associated pneumonia #3 hyponatremia #4 anemia CRITICAL CARE Performed by: Orlie Dakin Total critical care time: 40 minute Critical care time was exclusive of separately billable procedures and treating other patients. Critical care was necessary to treat or prevent imminent or life-threatening deterioration. Critical care was time spent personally by me on the following activities: development of treatment plan with patient and/or surrogate as well as nursing, discussions with consultants, evaluation of patient's response to treatment, examination of patient, obtaining history from patient or surrogate, ordering and performing treatments and interventions, ordering and review of laboratory studies, ordering and review of radiographic studies, pulse oximetry and re-evaluation of patient's condition.      Orlie Dakin, MD 10/25/13 2130

## 2013-10-25 NOTE — ED Notes (Signed)
Elevated C-4 result reported to Dr. Lenna Sciara

## 2013-10-25 NOTE — ED Notes (Addendum)
First bolus started 1820

## 2013-10-25 NOTE — Progress Notes (Signed)
ANTIBIOTIC CONSULT NOTE - INITIAL  Pharmacy Consult for Vancomycin, Cefepime Indication: pneumonia  Allergies  Allergen Reactions  . Poison Ivy Extract [Extract Of Poison Ivy] Rash    Patient Measurements:   Adjusted Body Weight: n/a  Vital Signs: Temp: 103 F (39.4 C) (04/21 1825) Temp src: Oral (04/21 1825) BP: 166/92 mmHg (04/21 1945) Pulse Rate: 147 (04/21 1945) Intake/Output from previous day:   Intake/Output from this shift:    Labs:  Recent Labs  10/25/13 1930  WBC 13.4*  HGB 9.7*  PLT 170   The CrCl is unknown because both a height and weight (above a minimum accepted value) are required for this calculation. No results found for this basename: VANCOTROUGH, VANCOPEAK, VANCORANDOM, GENTTROUGH, GENTPEAK, GENTRANDOM, TOBRATROUGH, TOBRAPEAK, TOBRARND, AMIKACINPEAK, AMIKACINTROU, AMIKACIN,  in the last 72 hours   Microbiology: No results found for this or any previous visit (from the past 720 hour(s)).  Medical History: Past Medical History  Diagnosis Date  . Hypertension   . Hyperlipemia   . Emphysema   . Stroke   . Chronic pain   . Cirrhosis of liver   . Fall   . Dental injury   . Bone spur of other site     Left Foot  . Impaired mobility   . Total self care deficit   . Pneumonia   . Emphysema   . ETOH abuse   . Elevated LFTs   . Arthritis   . Anxiety   . Sinusitis   . Anemia     iron def.  . Hard of hearing   . Diabetes mellitus without complication   . COPD (chronic obstructive pulmonary disease)   . Depression   . Glaucoma   . Emphysema of lung   . Cataract     Medications:   (Not in a hospital admission) Assessment: 87 YOM with generalized weakness and cough with white sputum. Pharmacy to start empiric treatment with Vancomycin and Cefepime. WBC 13.4, Max temp 103F. CrCl ~ 84 mL/min  Abx given in ED  1) Vancomycin 1 gm IV x 1 dose 2) Cefepime 2 gm IV x 1 dose   Cultures: 4/21 Sputum Cx >>  4/21 Blood Cx x2>> 4/21 Urine  Cx>>   Goal of Therapy:  Vancomycin trough level 15-20 mcg/ml  Plan:  1) Vancomycin 750 mg IV Q 8 hours  2) Cefepime 1 gm IV Q 12 hours  3) Monitor CBC, renal fx, cultures and patient's clinical progress 4) Collect Vancomycin trough as indicated  Albertina Parr, PharmD.  Clinical Pharmacist Pager [phone]

## 2013-10-25 NOTE — ED Notes (Signed)
Dr. Jacubowitz at bedside 

## 2013-10-25 NOTE — ED Notes (Signed)
Pt in from home via [facility], per report pts brother called in because "he didn't look right" upon arrival to the pts home he was A&O x4, follows commands, speaks in complete sentences per baseline, no facial droop present, denies slurred speech, pt smells of ETOH, denies use of ETOH today, last drink last night, pt shaky & diaphoretic upon arrival to ED, HR varies from 110-150

## 2013-10-25 NOTE — H&P (Addendum)
Hospitalist Admission History and Physical  Patient name: Peter [name] Medical record number: [id number] Date of birth: September 20, 1950 Age: 63 y.o. Gender: male  Primary Care Provider: Faye Ramsay, MD  Chief Complaint: HCAP, sepsis, melanotic stools  History of Present Illness:This is a 63 y.o. year old male with noted prior history of heavy alcohol abuse, COPD, liver cirrhosis, presenting with healthcare associated pneumonia, sepsis, melanotic stools. The patient was discharged from the hospital back in January for weakness in the setting of alcohol intoxication and dehydration/malnutrition. Per the family, patient had 24-hour care at home. However, patient is on begin this kind of care for about the past month. Family states the patient has been drinking beer "all day every day ". Patient is a progressive cough for the past 3-4 days as well as no fever and sputum production. Family also stated the patient has had several loose bowel movements well today. They have been black and tarry as well as foul-smelling. Has baseline increased BMs. This has been worsening per family. Pt denies any abd pain. Has had CP assd with coughing as well as chronic LBP.  On presentation to ER. Tmax 103, HR 130s-150s in setting of dehydration. SBPs 120s-180s. SaO2 intially in low 80s, however has been in mid 90s both on and off supplemental O2. Code sepsis called. WBC 13.4. Na 123, bicarb 13 with AG 23. ETOH level @ 40. Lactate initally at 3.5 then 2 s/p 1L NS bolus. CXR with RML PNA. UA indicative of infection. Pt started on vanc and cefepime for HCAP coverage.    Patient Active Problem List   Diagnosis Date Noted  . HCAP (healthcare-associated pneumonia) 10/25/2013  . Weakness 08/01/2013  . Dizziness 04/06/2013  . Chronic alcohol abuse 02/16/2013  . Liver cirrhosis, alcoholic 38/75/6433  . Depression (emotion) 02/16/2013  . COPD, moderate 02/16/2013  . Aspiration pneumonia 01/16/2013  . UTI (urinary tract  infection) 01/16/2013  . Hypokalemia 01/15/2013  . Hypomagnesemia 01/15/2013  . Fever, unspecified 01/15/2013  . Leukopenia 01/13/2013  . Anemia 01/13/2013  . Orthostatic hypotension 01/13/2013  . Alcohol intoxication 01/12/2013  . Syncope 01/12/2013  . Lactic acidosis 01/12/2013  . Cirrhosis 01/12/2013  . History of stroke 01/12/2013  . Acute alcohol intoxication 08/30/2012  . Iron deficiency 02/04/2012  . Hyponatremia 01/31/2012  . COPD (chronic obstructive pulmonary disease) 01/03/2012   Past Medical History: Past Medical History  Diagnosis Date  . Hypertension   . Hyperlipemia   . Emphysema   . Stroke   . Chronic pain   . Cirrhosis of liver   . Fall   . Dental injury   . Bone spur of other site     Left Foot  . Impaired mobility   . Total self care deficit   . Pneumonia   . Emphysema   . ETOH abuse   . Elevated LFTs   . Arthritis   . Anxiety   . Sinusitis   . Anemia     iron def.  . Hard of hearing   . Diabetes mellitus without complication   . COPD (chronic obstructive pulmonary disease)   . Depression   . Glaucoma   . Emphysema of lung   . Cataract     Past Surgical History: Past Surgical History  Procedure Laterality Date  . Colonoscopy  01/06/2012    Procedure: COLONOSCOPY;  Surgeon: Beryle Beams, MD;  Location: [facility] ENDOSCOPY;  Service: Endoscopy;  Laterality: N/A;  . Esophagogastroduodenoscopy  01/06/2012    Procedure: ESOPHAGOGASTRODUODENOSCOPY (  EGD);  Surgeon: Beryle Beams, MD;  Location: [place];  Service: Endoscopy;  Laterality: N/A;  . Esophagogastroduodenoscopy  01/09/2012    Procedure: ESOPHAGOGASTRODUODENOSCOPY (EGD);  Surgeon: Beryle Beams, MD;  Location: [facility] ENDOSCOPY;  Service: Endoscopy;  Laterality: N/A;  . Multiple extractions with alveoloplasty  03/29/2012    Procedure: MULTIPLE EXTRACION WITH ALVEOLOPLASTY;  Surgeon: Gae Bon, DDS;  Location: [place];  Service: Oral Surgery;  Laterality: Bilateral;  . Bone spur  2013    left  spur  . Cataract extraction w/ intraocular lens implant    . Mouth surgery    . Colonoscopy      Social History: History   Social History  . Marital Status: Legally Separated    Spouse Name: N/A    Number of Children: 1  . Years of Education: N/A   Occupational History  . disabled    Social History Main Topics  . Smoking status: Current Every Day Smoker -- 2.00 packs/day for 30 years    Types: Cigarettes  . Smokeless tobacco: Never Used  . Alcohol Use: 2.4 oz/week    4 Cans of beer per week     Comment: per pt four  40oz daily beer  . Drug Use: No     Comment: hx cocaine, none in 4 to 5 years - 08/30/12  . Sexual Activity: Yes     Comment: no birth control   Other Topics Concern  . Not on file   Social History Narrative  . No narrative on file    Family History: Family History  Problem Relation Age of Onset  . Breast cancer Mother   . Diabetes Maternal Aunt   . Heart disease Sister   . Heart disease Maternal Aunt     Allergies: Allergies  Allergen Reactions  . Poison Ivy Extract [Extract Of Poison Ivy] Rash    Current Facility-Administered Medications  Medication Dose Route Frequency Provider Last Rate Last Dose  . 0.9 %  sodium chloride infusion  1,000 mL Intravenous Continuous Orlie Dakin, MD 999 mL/hr at 10/25/13 2113 1,000 mL at 10/25/13 2113  . [START ON 10/26/2013] acamprosate (CAMPRAL) tablet 666 mg  666 mg Oral TID WC Shanda Howells, MD      . acetaminophen (TYLENOL) suppository 650 mg  650 mg Rectal Q4H PRN Babette Relic, MD   650 mg at 10/25/13 1846  . albuterol (PROVENTIL HFA;VENTOLIN HFA) 108 (90 BASE) MCG/ACT inhaler 2 puff  2 puff Inhalation QID Shanda Howells, MD      . atorvastatin (LIPITOR) tablet 80 mg  80 mg Oral Daily Shanda Howells, MD      . brimonidine (ALPHAGAN) 0.2 % ophthalmic solution 1 drop  1 drop Both Eyes BID Shanda Howells, MD      . budesonide-formoterol [facility]) 80-4.5 MCG/ACT inhaler 2 puff  2 puff Inhalation BID Shanda Howells, MD      . Derrill Memo ON 10/26/2013] ceFEPIme (MAXIPIME) 1 g in dextrose 5 % 50 mL IVPB  1 g Intravenous Q12H Lauren Bajbus, RPH      . [START ON 10/26/2013] ferrous sulfate tablet 325 mg  325 mg Oral Q breakfast Shanda Howells, MD      . folic acid (FOLVITE) tablet 1 mg  1 mg Oral Daily Shanda Howells, MD      . HYDROcodone-acetaminophen (NORCO/VICODIN) 5-325 MG per tablet 1 tablet  1 tablet Oral Q6H PRN Shanda Howells, MD      . hydrOXYzine (ATARAX/VISTARIL) tablet 25 mg  25 mg Oral TID PRN Shanda Howells, MD      . multivitamin tablet 1 tablet  1 tablet Oral Daily Shanda Howells, MD      . pantoprazole (PROTONIX) EC tablet 40 mg  40 mg Oral Daily Shanda Howells, MD      . propranolol (INDERAL) tablet 10 mg  10 mg Oral TID Shanda Howells, MD      . sodium chloride 0.9 % 1,000 mL with thiamine 485 mg, folic acid 1 mg, multivitamins adult 10 mL infusion   Intravenous Once Shanda Howells, MD      . thiamine (VITAMIN B-1) tablet 100 mg  100 mg Oral Daily Shanda Howells, MD      . traZODone (DESYREL) tablet 50 mg  50 mg Oral QHS Shanda Howells, MD      . Derrill Memo ON 10/26/2013] vancomycin (VANCOCIN) IVPB 750 mg/150 ml premix  750 mg Intravenous Q8H Darnell Level Mancheril, RPH      . vitamin B-12 (CYANOCOBALAMIN) tablet 100 mcg  100 mcg Oral Daily Shanda Howells, MD      . vitamin E capsule 200 Units  200 Units Oral Daily Shanda Howells, MD       Current Outpatient Prescriptions  Medication Sig Dispense Refill  . acamprosate (CAMPRAL) 333 MG tablet Take 666 mg by mouth 3 (three) times daily with meals.      Marland Kitchen albuterol (PROVENTIL HFA;VENTOLIN HFA) 108 (90 BASE) MCG/ACT inhaler Inhale 2 puffs into the lungs 4 (four) times daily.      Marland Kitchen atorvastatin (LIPITOR) 80 MG tablet Take 1 tablet (80 mg total) by mouth daily.  30 tablet  2  . brimonidine (ALPHAGAN) 0.2 % ophthalmic solution Place 1 drop into both eyes 2 (two) times daily.      . budesonide-formoterol (SYMBICORT) 80-4.5 MCG/ACT inhaler Inhale 2 puffs into the  lungs 2 (two) times daily.      . clotrimazole (LOTRIMIN) 1 % cream Apply topically 2 (two) times daily.  15 g  0  . ferrous sulfate 325 (65 FE) MG tablet Take 1 tablet (325 mg total) by mouth daily with breakfast. For low iron.  30 tablet  2  . folic acid (FOLVITE) 1 MG tablet Take 1 mg by mouth daily.      Marland Kitchen HYDROcodone-acetaminophen (NORCO/VICODIN) 5-325 MG per tablet Take 1 tablet by mouth every 6 (six) hours as needed for moderate pain.      . hydrOXYzine (ATARAX/VISTARIL) 25 MG tablet Take 25 mg by mouth 3 (three) times daily as needed for anxiety.      . Multiple Vitamin (MULTIVITAMIN) tablet Take 1 tablet by mouth daily.  30 tablet  0  . pantoprazole (PROTONIX) 40 MG tablet Take 40 mg by mouth daily.      . propranolol (INDERAL) 10 MG tablet Take 1 tablet (10 mg total) by mouth 3 (three) times daily.  90 tablet  3  . thiamine (VITAMIN B-1) 100 MG tablet Take 1 tablet (100 mg total) by mouth daily. For vitamin B deficiency.  30 tablet  2  . traZODone (DESYREL) 50 MG tablet Take 1 tablet (50 mg total) by mouth at bedtime.  30 tablet  0  . vitamin B-12 (CYANOCOBALAMIN) 100 MCG tablet Take 1 tablet (100 mcg total) by mouth daily. For low B12.  30 tablet  2  . vitamin E 200 UNIT capsule Take 1 capsule (200 Units total) by mouth daily.  7 capsule  0   Review Of Systems: 12 point ROS  negative except as noted above in HPI.  Physical Exam: Filed Vitals:   10/25/13 2100  BP: 120/77  Pulse: 132  Temp:   Resp: 22    General: cachectic, disshevled appearing HEENT: PERRLA, extra ocular movement intact and poor dentition Heart: S1, S2 normal, no murmur, rub or gallop, regular rate and rhythm Lungs: diffuse inspiratory, expiratory wheezes rales, most predominant in RLL Abdomen: abdomen is soft without significant tenderness, masses, organomegaly or guarding Extremities: extremities normal, atraumatic, no cyanosis or edema Skin:no rashes Neurology: + generalized tremors and ?asterixis in  setting of fever, otherwise non focal neuro exam.   Labs and Imaging: Lab Results  Component Value Date/Time   NA 123* 10/25/2013  7:30 PM   K 5.0 10/25/2013  7:30 PM   CL 87* 10/25/2013  7:30 PM   CO2 13* 10/25/2013  7:30 PM   BUN 7 10/25/2013  7:30 PM   CREATININE 0.88 10/25/2013  7:30 PM   CREATININE 1.02 06/07/2013  2:37 PM   GLUCOSE 70 10/25/2013  7:30 PM   Lab Results  Component Value Date   WBC 13.4* 10/25/2013   HGB 9.7* 10/25/2013   HCT 28.1* 10/25/2013   MCV 80.1 10/25/2013   PLT 170 10/25/2013   Urinalysis    Component Value Date/Time   COLORURINE YELLOW 10/25/2013 1939   APPEARANCEUR TURBID* 10/25/2013 1939   LABSPEC 1.011 10/25/2013 1939   PHURINE 5.5 10/25/2013 1939   GLUCOSEU NEGATIVE 10/25/2013 1939   HGBUR MODERATE* 10/25/2013 1939   BILIRUBINUR NEGATIVE 10/25/2013 1939   KETONESUR 15* 10/25/2013 1939   PROTEINUR 30* 10/25/2013 1939   UROBILINOGEN 0.2 10/25/2013 1939   NITRITE POSITIVE* 10/25/2013 1939   LEUKOCYTESUR LARGE* 10/25/2013 1939       Dg Chest Port 1 View  (if Code Sepsis Called)  10/25/2013   CLINICAL DATA:  WEAKNESS  EXAM: PORTABLE CHEST - 1 VIEW  COMPARISON:  DG CHEST 2 VIEW dated 04/06/2013  FINDINGS: The cardiac silhouette is within normal limits. Aorta is tortuous and ectatic. Atherosclerotic calcifications are appreciated within the aorta. Diffuse increased density is appreciated within the medial and lateral segments of the right middle lobe. No further focal regions of consolidation or focal infiltrates. The osseous structures unremarkable.  IMPRESSION: Right middle lobe infiltrate.   Electronically Signed   By: Margaree Mackintosh M.D.   On: 10/25/2013 19:05     Assessment and Plan: MARSH BEDOY is a 63 y.o. year old male presenting with sepsis, melanotic stools, ETOH intoxication, AGMA   Sepsis: likely secondary to HCAP. Continue vanc and cefepime. Panculture. Check urine strep and legionella. Baseline higher risk of aspiration source given ETOH abuse and  hx/o aspiration PNA. Supplemental O2 prn. IVF. May be mild overlapping COPD flare. Continue albuterol. ? Start solumedrol pending hemoccult.   Melanotic stools/diarrhea: hemoccult pending. Diarrhea seems to be sub acute on chronic issue. Check stool studies including C diff. Start flagyl as clinically indicated. Hgb fairly stable from last admission, though pt dehydrated and likely hemo-concentraed. High dose PPI. Avoid NSAIDs, anti-platelet, anti coagulation. GI consult if heme +.  ETOH abuse: ETOH + today. Noted tremors on exam. ETOH withrdawal vs. Sepsis for source. CIWA protocol. Banana bag. ETOH counseling in house.   Cirrhosis: no abd pain on exam concerning for SBP. Continue propranolol.   AGMA: likely secondary to ETOH intoxication with overlapping dehydration. Lactate normalized with IVF. Will hydrate and trend.   Anemia: ACD. hgb stable from last admission, though with ? Melanotic stools. Hemoccult  pending. Continue to follow.   Hyponatremia: acute on chronic issue. Likely secondary to beer potomania. Hypovolemic today. Will gently hydrate with NS. Avoid > 6-8 mEq change w/in 24 hours as to avoid CPM. Continue to follow closely.   DM: SSI. A1C  FEN/GI: NPO. High dose PPI  Prophylaxis: SCDs pending hemoccult Disposition: pending further evaluation Code Status: Full Code        Shanda Howells MD  Pager: [phone]

## 2013-10-26 ENCOUNTER — Encounter (HOSPITAL_COMMUNITY): Payer: Self-pay | Admitting: *Deleted

## 2013-10-26 DIAGNOSIS — D649 Anemia, unspecified: Secondary | ICD-10-CM

## 2013-10-26 LAB — GLUCOSE, CAPILLARY
GLUCOSE-CAPILLARY: 101 mg/dL — AB (ref 70–99)
Glucose-Capillary: 111 mg/dL — ABNORMAL HIGH (ref 70–99)
Glucose-Capillary: 91 mg/dL (ref 70–99)
Glucose-Capillary: 101 mg/dL — ABNORMAL HIGH (ref 70–99)
Glucose-Capillary: 117 mg/dL — ABNORMAL HIGH (ref 70–99)

## 2013-10-26 LAB — TROPONIN I: Troponin I: <0.3 ng/mL (ref <0.30)

## 2013-10-26 LAB — COMPREHENSIVE METABOLIC PANEL
ALBUMIN: 3.1 g/dL — AB (ref 3.5–5.2)
ALK PHOS: 94 U/L (ref 39–117)
ALT: 27 U/L (ref 0–53)
ALT: 26 U/L (ref 0–53)
AST: 47 U/L — ABNORMAL HIGH (ref 0–37)
AST: 46 U/L — AB (ref 0–37)
Albumin: 3.2 g/dL — ABNORMAL LOW (ref 3.5–5.2)
Alkaline Phosphatase: 95 U/L (ref 39–117)
BILIRUBIN TOTAL: 0.6 mg/dL (ref 0.3–1.2)
BUN: 8 mg/dL (ref 6–23)
BUN: 8 mg/dL (ref 6–23)
CALCIUM: 8.6 mg/dL (ref 8.4–10.5)
CO2: 19 mEq/L (ref 19–32)
CO2: 16 mEq/L — ABNORMAL LOW (ref 19–32)
CREATININE: 0.74 mg/dL (ref 0.50–1.35)
CREATININE: 0.84 mg/dL (ref 0.50–1.35)
Calcium: 8.6 mg/dL (ref 8.4–10.5)
Chloride: 93 mEq/L — ABNORMAL LOW (ref 96–112)
Chloride: 98 mEq/L (ref 96–112)
GFR calc Af Amer: >90 mL/min (ref >90)
GFR calc non Af Amer: >90 mL/min (ref >90)
GFR calc non Af Amer: >90 mL/min (ref >90)
Glucose, Bld: 104 mg/dL — ABNORMAL HIGH (ref 70–99)
Glucose, Bld: 105 mg/dL — ABNORMAL HIGH (ref 70–99)
Potassium: 4.2 mEq/L (ref 3.7–5.3)
Potassium: 3.9 mEq/L (ref 3.7–5.3)
Sodium: 128 mEq/L — ABNORMAL LOW (ref 137–147)
Sodium: 132 mEq/L — ABNORMAL LOW (ref 137–147)
TOTAL PROTEIN: 7.7 g/dL (ref 6.0–8.3)
Total Bilirubin: 0.6 mg/dL (ref 0.3–1.2)
Total Protein: 7.4 g/dL (ref 6.0–8.3)

## 2013-10-26 LAB — CBC WITH DIFFERENTIAL/PLATELET
BASOS ABS: 0 10*3/uL (ref 0.0–0.1)
BASOS PCT: 0 % (ref 0–1)
EOS ABS: 0 10*3/uL (ref 0.0–0.7)
Eosinophils Relative: 0 % (ref 0–5)
HCT: 27.9 % — ABNORMAL LOW (ref 39.0–52.0)
HEMOGLOBIN: 9.7 g/dL — AB (ref 13.0–17.0)
Lymphocytes Relative: 4 % — ABNORMAL LOW (ref 12–46)
Lymphs Abs: 0.5 10*3/uL — ABNORMAL LOW (ref 0.7–4.0)
MCH: 27.3 pg (ref 26.0–34.0)
MCHC: 34.8 g/dL (ref 30.0–36.0)
MCV: 78.6 fL (ref 78.0–100.0)
MONO ABS: 1 10*3/uL (ref 0.1–1.0)
MONOS PCT: 8 % (ref 3–12)
NEUTROS ABS: 11.7 10*3/uL — AB (ref 1.7–7.7)
Neutrophils Relative %: 88 % — ABNORMAL HIGH (ref 43–77)
Platelets: 172 10*3/uL (ref 150–400)
RBC: 3.55 MIL/uL — ABNORMAL LOW (ref 4.22–5.81)
RDW: 16.6 % — AB (ref 11.5–15.5)
WBC: 13.2 10*3/uL — ABNORMAL HIGH (ref 4.0–10.5)

## 2013-10-26 LAB — MRSA PCR SCREENING: MRSA BY PCR: NEGATIVE

## 2013-10-26 LAB — INFLUENZA PANEL BY PCR (TYPE A & B)
H1N1 flu by pcr: NOT DETECTED
INFLAPCR: NEGATIVE
Influenza B By PCR: NEGATIVE

## 2013-10-26 LAB — LEGIONELLA ANTIGEN, URINE: LEGIONELLA ANTIGEN, URINE: NEGATIVE

## 2013-10-26 LAB — OCCULT BLOOD X 1 CARD TO LAB, STOOL: Fecal Occult Bld: POSITIVE — AB

## 2013-10-26 LAB — HIV ANTIBODY (ROUTINE TESTING W REFLEX): HIV 1&2 Ab, 4th Generation: NONREACTIVE

## 2013-10-26 LAB — STREP PNEUMONIAE URINARY ANTIGEN: Strep Pneumo Urinary Antigen: NEGATIVE

## 2013-10-26 LAB — CLOSTRIDIUM DIFFICILE BY PCR: Toxigenic C. Difficile by PCR: NEGATIVE

## 2013-10-26 MED ORDER — ACETAMINOPHEN 325 MG PO TABS
650.0000 mg | ORAL_TABLET | Freq: Four times a day (QID) | ORAL | Status: DC | PRN
  Administered 2013-10-26: 650 mg via ORAL
  Filled 2013-10-26 (×2): qty 2

## 2013-10-26 MED ORDER — PANTOPRAZOLE SODIUM 40 MG PO TBEC
40.0000 mg | DELAYED_RELEASE_TABLET | Freq: Two times a day (BID) | ORAL | Status: DC
  Administered 2013-10-26 – 2013-10-28 (×5): 40 mg via ORAL
  Filled 2013-10-26 (×5): qty 1

## 2013-10-26 MED ORDER — SODIUM CHLORIDE 0.9 % IV BOLUS (SEPSIS)
500.0000 mL | Freq: Once | INTRAVENOUS | Status: AC
Start: 2013-10-26 — End: 2013-10-26
  Administered 2013-10-26: 500 mL via INTRAVENOUS

## 2013-10-26 MED ORDER — IBUPROFEN 800 MG PO TABS
800.0000 mg | ORAL_TABLET | Freq: Once | ORAL | Status: DC
  Filled 2013-10-26: qty 1

## 2013-10-26 MED ORDER — SODIUM CHLORIDE 0.9 % IV SOLN
800.0000 mg | Freq: Once | INTRAVENOUS | Status: AC
  Administered 2013-10-26: 800 mg via INTRAVENOUS
  Filled 2013-10-26: qty 8

## 2013-10-26 MED ORDER — ACETAMINOPHEN 325 MG PO TABS
650.0000 mg | ORAL_TABLET | ORAL | Status: DC | PRN
  Administered 2013-10-26 – 2013-10-27 (×4): 650 mg via ORAL
  Filled 2013-10-26 (×4): qty 2

## 2013-10-26 MED ORDER — IPRATROPIUM-ALBUTEROL 0.5-2.5 (3) MG/3ML IN SOLN
3.0000 mL | Freq: Four times a day (QID) | RESPIRATORY_TRACT | Status: DC
  Administered 2013-10-26 – 2013-10-28 (×10): 3 mL via RESPIRATORY_TRACT
  Filled 2013-10-26 (×9): qty 3

## 2013-10-26 MED ORDER — METOPROLOL TARTRATE 1 MG/ML IV SOLN
5.0000 mg | Freq: Once | INTRAVENOUS | Status: AC
  Administered 2013-10-26: 5 mg via INTRAVENOUS
  Filled 2013-10-26: qty 5

## 2013-10-26 MED ORDER — PIPERACILLIN-TAZOBACTAM 3.375 G IVPB
3.3750 g | Freq: Three times a day (TID) | INTRAVENOUS | Status: AC
  Administered 2013-10-26 – 2013-10-27 (×5): 3.375 g via INTRAVENOUS
  Administered 2013-10-28: 04:00:00 via INTRAVENOUS
  Administered 2013-10-28 – 2013-11-01 (×12): 3.375 g via INTRAVENOUS
  Filled 2013-10-26 (×24): qty 50

## 2013-10-26 NOTE — Progress Notes (Signed)
Utilization review completed.  

## 2013-10-26 NOTE — Progress Notes (Signed)
HR still 130s-140s. NP notified. New orders received. Will carry out and continue to monitor.

## 2013-10-26 NOTE — Progress Notes (Signed)
[facility] PROGRESS NOTE  FARUQ ROSENBERGER IFO:[id number] DOB: October 21, 1950 DOA: 10/25/2013 PCP: No primary provider on file.  Assessment/Plan  Sepsis due to HCAP and pyelonephritis -  Continue vancomycin and change cefepime to Zosyn for better aspiration coverage -  Follow blood and urine cultures -  F/u sputum cx (mixed flora on gram stain) -  Strep pneumo negative -  Legionella pending -  Continue to hold diuretics -  Continue stepdown  Melena & diarrhea -  Occult pending   -  Followup C. difficile PCR -  Fecal lactoferrin, ova and parasite, and stool culture are pending -  Continue to avoid NSAIDs, antiplatelets, anticoagulation -  Increase PPI to twice a day -  Check INR  Ethanol abuse -  Continue CIWA protocol -  Continue thiamine, folate, multivitamin -  Continue propranolol -  Continue acamprosate  Sinus tachycardia, likely secondary to sepsis versus dehydration and alcohol withdrawal -  Continue beta blocker and IV fluids -  Continue antibiotics  Cirrhosis, ammonia level within normal limits, no abdominal pain suggestive of SBP, melena may be related, plt normal -  Continue propranolol -  Holding diuretics due to recent dehydration  Positive anion gap metabolic acidosis -  Lactic acid normalized with IV fluids -  May be secondary to ketosis from alcoholism  Anemia of chronic disease and possibly blood loss, hemoglobin approximately stable since previous admission -  Continue B12 and iron supplementation  Hyponatremia, secondary to cirrhosis and beer diet, dehydration -  Sodium trending up of IV fluids  COPD, stable, continue Symbicort and when necessary duo nebs  Leukocytosis, likely secondary to sepsis, trend  Diet:  Advance to CLD Access:  PIV IVF:  yes Proph:  SCDs  Code Status: Full Family Communication: Patient alone Disposition Plan: Pending improvement in vital signs.  For now, continue  stepdown   Consultants:  None  Procedures:  Chest x-ray  Antibiotics:  Vancomycin from 4/21  Cefepime from 4/21 >> 4/22  Zosyn 4/22 >>  HPI/Subjective:  Patient denies shortness of breath, difficulty breathing, nausea, abdominal pain. He has some pain in his right flank. Occasional dysuria    Objective: Filed Vitals:   10/26/13 0255 10/26/13 0317 10/26/13 0400 10/26/13 0713  BP:  163/92    Pulse: 151 141 132 117  Temp:  100.3 F (37.9 C)  100.2 F (37.9 C)  TempSrc:  Oral  Oral  Resp:  23  20  Height:      Weight:      SpO2:  96%      Intake/Output Summary (Last 24 hours) at 10/26/13 0747 Last data filed at 10/26/13 0600  Gross per 24 hour  Intake   3325 ml  Output   2200 ml  Net   1125 ml   Filed Weights   10/25/13 1945 10/25/13 2312  Weight: 69.9 kg (154 lb 1.6 oz) 66.8 kg (147 lb 4.3 oz)    Exam:   General: Thin African American male, No acute distress  HEENT:  NCAT, MMM  Cardiovascular:  Tachycardic, regular rhythm, nl S1, S2 no mrg, 2+ pulses, warm extremities  Respiratory:  Rhonchorous bilateral breath sounds, diminished on the right anterior and right axillary spaces compared to last, no increased WOB, nasal cannula in place  Abdomen:   NABS, soft, NT/ND, moderate hepatomegaly  MSK:   Normal tone and bulk, no LEE  Neuro:  Grossly intact, generalized weakness, tongue fasciculations, hand tremors  Data Reviewed: Basic Metabolic Panel:  Recent Labs Lab  10/25/13 1930 10/26/13 0050 10/26/13 0340  NA 123* 128* 132*  K 5.0 4.2 3.9  CL 87* 93* 98  CO2 13* 19 16*  GLUCOSE 70 104* 105*  BUN 7 8 8   CREATININE 0.88 0.84 0.74  CALCIUM 8.8 8.6 8.6   Liver Function Tests:  Recent Labs Lab 10/25/13 1930 10/26/13 0050 10/26/13 0340  AST 59* 46* 47*  ALT 30 27 26   ALKPHOS 105 95 94  BILITOT 0.5 0.6 0.6  PROT 7.8 7.7 7.4  ALBUMIN 3.3* 3.2* 3.1*   No results found for this basename: LIPASE, AMYLASE,  in the last 168 hours  Recent  Labs Lab 10/25/13 2155  AMMONIA 26   CBC:  Recent Labs Lab 10/25/13 1930 10/26/13 0340  WBC 13.4* 13.2*  NEUTROABS 11.8* 11.7*  HGB 9.7* 9.7*  HCT 28.1* 27.9*  MCV 80.1 78.6  PLT 170 172   Cardiac Enzymes:  Recent Labs Lab 10/25/13 2155 10/26/13 0340  TROPONINI <0.30 <0.30   BNP (last 3 results) No results found for this basename: PROBNP,  in the last 8760 hours CBG: No results found for this basename: GLUCAP,  in the last 168 hours  Recent Results (from the past 240 hour(s))  CULTURE, EXPECTORATED SPUTUM-ASSESSMENT     Status: None   Collection Time    10/25/13  8:00 PM      Result Value Ref Range Status   Specimen Description SPUTUM   Final   Special Requests Normal   Final   Sputum evaluation     Final   Value: THIS SPECIMEN IS ACCEPTABLE. RESPIRATORY CULTURE REPORT TO FOLLOW.   Report Status 10/25/2013 FINAL   Final  MRSA PCR SCREENING     Status: None   Collection Time    10/25/13 11:44 PM      Result Value Ref Range Status   MRSA by PCR NEGATIVE  NEGATIVE Final   Comment:            The GeneXpert MRSA Assay (FDA     approved for NASAL specimens     only), is one component of a     comprehensive MRSA colonization     surveillance program. It is not     intended to diagnose MRSA     infection nor to guide or     monitor treatment for     MRSA infections.     Studies: Dg Chest Port 1 View  (if Code Sepsis Called)  10/25/2013   CLINICAL DATA:  WEAKNESS  EXAM: PORTABLE CHEST - 1 VIEW  COMPARISON:  DG CHEST 2 VIEW dated 04/06/2013  FINDINGS: The cardiac silhouette is within normal limits. Aorta is tortuous and ectatic. Atherosclerotic calcifications are appreciated within the aorta. Diffuse increased density is appreciated within the medial and lateral segments of the right middle lobe. No further focal regions of consolidation or focal infiltrates. The osseous structures unremarkable.  IMPRESSION: Right middle lobe infiltrate.   Electronically Signed   By:  Margaree Mackintosh M.D.   On: 10/25/2013 19:05    Scheduled Meds: . acamprosate  666 mg Oral TID WC  . atorvastatin  80 mg Oral Daily  . brimonidine  1 drop Both Eyes BID  . budesonide-formoterol  2 puff Inhalation BID  . ceFEPime (MAXIPIME) IV  1 g Intravenous Q12H  . ferrous sulfate  325 mg Oral Q breakfast  . folic acid  1 mg Oral Daily  . ipratropium-albuterol  3 mL Nebulization QID  . multivitamin with minerals  1 tablet Oral Daily  . pantoprazole  40 mg Oral Daily  . propranolol  10 mg Oral TID  . thiamine  100 mg Oral Daily  . traZODone  50 mg Oral QHS  . vancomycin  750 mg Intravenous Q8H  . vitamin B-12  100 mcg Oral Daily  . vitamin E  200 Units Oral Daily   Continuous Infusions: . sodium chloride 1,000 mL (10/26/13 0547)    Active Problems:   HCAP (healthcare-associated pneumonia)    Time spent: 2 min    Janece Canterbury  [facility] Pager [phone]. If 7PM-7AM, please contact night-coverage at [email or web address], password [email or web address] 10/26/2013, 7:47 AM  LOS: 1 day

## 2013-10-26 NOTE — Progress Notes (Signed)
Pt admitted from ED into 3S13 with a cell phone. T 100.6 oral HR 130s-140s (ST). NP notified. New orders received. Will carry out and continue to monitor.

## 2013-10-26 NOTE — Progress Notes (Signed)
T 100.3 (oral) and HR still in 140s. NP notified. New orders received. Will carry out and continue to monitor.

## 2013-10-27 DIAGNOSIS — I5020 Unspecified systolic (congestive) heart failure: Secondary | ICD-10-CM | POA: Diagnosis present

## 2013-10-27 DIAGNOSIS — E872 Acidosis, unspecified: Secondary | ICD-10-CM

## 2013-10-27 DIAGNOSIS — R55 Syncope and collapse: Secondary | ICD-10-CM

## 2013-10-27 DIAGNOSIS — R5383 Other fatigue: Secondary | ICD-10-CM

## 2013-10-27 DIAGNOSIS — N390 Urinary tract infection, site not specified: Secondary | ICD-10-CM

## 2013-10-27 DIAGNOSIS — E876 Hypokalemia: Secondary | ICD-10-CM

## 2013-10-27 DIAGNOSIS — Z720 Tobacco use: Secondary | ICD-10-CM | POA: Diagnosis present

## 2013-10-27 DIAGNOSIS — K7030 Alcoholic cirrhosis of liver without ascites: Secondary | ICD-10-CM

## 2013-10-27 DIAGNOSIS — E871 Hypo-osmolality and hyponatremia: Secondary | ICD-10-CM

## 2013-10-27 DIAGNOSIS — F329 Major depressive disorder, single episode, unspecified: Secondary | ICD-10-CM

## 2013-10-27 DIAGNOSIS — R5381 Other malaise: Secondary | ICD-10-CM

## 2013-10-27 DIAGNOSIS — F3289 Other specified depressive episodes: Secondary | ICD-10-CM

## 2013-10-27 LAB — FECAL LACTOFERRIN, QUANT: Fecal Lactoferrin: NEGATIVE

## 2013-10-27 LAB — GLUCOSE, CAPILLARY
Glucose-Capillary: 98 mg/dL (ref 70–99)
Glucose-Capillary: 137 mg/dL — ABNORMAL HIGH (ref 70–99)
Glucose-Capillary: 100 mg/dL — ABNORMAL HIGH (ref 70–99)
Glucose-Capillary: 163 mg/dL — ABNORMAL HIGH (ref 70–99)

## 2013-10-27 LAB — URINE CULTURE: Colony Count: >=100000

## 2013-10-27 LAB — COMPREHENSIVE METABOLIC PANEL
ALT: 20 U/L (ref 0–53)
AST: 39 U/L — ABNORMAL HIGH (ref 0–37)
Albumin: 2.4 g/dL — ABNORMAL LOW (ref 3.5–5.2)
Alkaline Phosphatase: 83 U/L (ref 39–117)
BILIRUBIN TOTAL: 0.5 mg/dL (ref 0.3–1.2)
BUN: 9 mg/dL (ref 6–23)
CHLORIDE: 102 meq/L (ref 96–112)
CO2: 18 meq/L — AB (ref 19–32)
CREATININE: 0.94 mg/dL (ref 0.50–1.35)
Calcium: 8.1 mg/dL — ABNORMAL LOW (ref 8.4–10.5)
GFR, EST NON AFRICAN AMERICAN: 89 mL/min — AB (ref >90)
GLUCOSE: 92 mg/dL (ref 70–99)
Potassium: 3.4 mEq/L — ABNORMAL LOW (ref 3.7–5.3)
Sodium: 135 mEq/L — ABNORMAL LOW (ref 137–147)
Total Protein: 6.4 g/dL (ref 6.0–8.3)

## 2013-10-27 LAB — PROTIME-INR
INR: 1.17 (ref 0.00–1.49)
PROTHROMBIN TIME: 14.7 s (ref 11.6–15.2)

## 2013-10-27 LAB — CULTURE, RESPIRATORY W GRAM STAIN

## 2013-10-27 LAB — CBC
HCT: 24.3 % — ABNORMAL LOW (ref 39.0–52.0)
Hemoglobin: 8.2 g/dL — ABNORMAL LOW (ref 13.0–17.0)
MCH: 26.9 pg (ref 26.0–34.0)
MCHC: 33.7 g/dL (ref 30.0–36.0)
MCV: 79.7 fL (ref 78.0–100.0)
PLATELETS: 161 10*3/uL (ref 150–400)
RBC: 3.05 MIL/uL — AB (ref 4.22–5.81)
RDW: 16.8 % — ABNORMAL HIGH (ref 11.5–15.5)
WBC: 12 10*3/uL — ABNORMAL HIGH (ref 4.0–10.5)

## 2013-10-27 LAB — OVA AND PARASITE EXAMINATION: OVA AND PARASITES: NONE SEEN

## 2013-10-27 MED ORDER — SODIUM CHLORIDE 0.9 % IV BOLUS (SEPSIS)
500.0000 mL | Freq: Once | INTRAVENOUS | Status: AC
  Administered 2013-10-27: 500 mL via INTRAVENOUS

## 2013-10-27 MED ORDER — METHYLPREDNISOLONE SODIUM SUCC 125 MG IJ SOLR
60.0000 mg | INTRAMUSCULAR | Status: DC
  Administered 2013-10-27: 60 mg via INTRAVENOUS
  Filled 2013-10-27 (×2): qty 0.96

## 2013-10-27 MED ORDER — BUPROPION HCL ER (XL) 150 MG PO TB24
150.0000 mg | ORAL_TABLET | Freq: Every day | ORAL | Status: DC
  Administered 2013-10-27 – 2013-10-28 (×2): 150 mg via ORAL
  Filled 2013-10-27 (×3): qty 1

## 2013-10-27 MED ORDER — STERILE WATER FOR INJECTION IV SOLN
INTRAVENOUS | Status: DC
  Administered 2013-10-27: 21:00:00 via INTRAVENOUS
  Filled 2013-10-27 (×3): qty 850

## 2013-10-27 MED ORDER — NICOTINE 21 MG/24HR TD PT24
21.0000 mg | MEDICATED_PATCH | Freq: Every day | TRANSDERMAL | Status: DC
  Administered 2013-10-27 – 2013-10-28 (×2): 21 mg via TRANSDERMAL
  Filled 2013-10-27 (×2): qty 1

## 2013-10-27 MED ORDER — DM-GUAIFENESIN ER 30-600 MG PO TB12
1.0000 | ORAL_TABLET | Freq: Two times a day (BID) | ORAL | Status: DC
  Administered 2013-10-27: 1 via ORAL
  Filled 2013-10-27 (×3): qty 1

## 2013-10-27 MED ORDER — BUDESONIDE 0.25 MG/2ML IN SUSP
0.2500 mg | Freq: Two times a day (BID) | RESPIRATORY_TRACT | Status: DC
  Administered 2013-10-27 – 2013-10-28 (×2): 0.25 mg via RESPIRATORY_TRACT
  Filled 2013-10-27 (×4): qty 2

## 2013-10-27 NOTE — Progress Notes (Signed)
[place] TEAM 1 - Stepdown/ICU TEAM Progress Note  Peter [name] YPP:[id number] DOB: Mar 23, 1951 DOA: 10/25/2013 PCP: No primary provider on file.  Admit HPI / Brief Narrative: 63 y.o. BM PMHx heavy alcohol abuse, COPD, liver cirrhosis, presenting with healthcare associated pneumonia, sepsis, melanotic stools. The patient was discharged from the hospital back in January for weakness in the setting of alcohol intoxication and dehydration/malnutrition. Per the family, patient had 24-hour care at home. However, patient is on begin this kind of care for about the past month. Family states the patient has been drinking beer "all day every day ". Patient is a progressive cough for the past 3-4 days as well as no fever and sputum production. Family also stated the patient has had several loose bowel movements well today. They have been black and tarry as well as foul-smelling. Has baseline increased BMs. This has been worsening per family. Pt denies any abd pain. Has had CP assd with coughing as well as chronic LBP.  On presentation to ER. Tmax 103, HR 130s-150s in setting of dehydration. SBPs 120s-180s. SaO2 intially in low 80s, however has been in mid 90s both on and off supplemental O2. Code sepsis called. WBC 13.4. Na 123, bicarb 13 with AG 23. ETOH level @ 40. Lactate initally at 3.5 then 2 s/p 1L NS bolus. CXR with RML PNA. UA indicative of infection. Pt started on vanc and cefepime for HCAP coverage.    HPI/Subjective: 4/23 patient with increased work of breathing can only talk in 2-3 word sentences.  Assessment/Plan: Sepsis due to HCAP and pyelonephritis  - Continue vancomycin + Zosyn (better aspiration coverage) - Strep pneumo negative  - Legionella pending  - Respiratory viral panel pending  -Lactic acid a.m.   Melena & diarrhea  - Occult pending  - C. difficile PCR negative -GI pathogen panel by PCR pending - Fecal lactoferrin, ova and parasite, and stool culture are NTD - Continue to  avoid NSAIDs, antiplatelets, anticoagulation  - Increase PPI to twice a day  - Check INR   Ethanol abuse  - Continue CIWA protocol  - Continue thiamine, folate, multivitamin  - Continue propranolol 10 mg TID - Continue acamprosate   Sinus tachycardia, likely secondary to sepsis versus dehydration and alcohol withdrawal  - Continue beta blocker and IV fluids  - Continue antibiotics   Cirrhosis, ammonia level within normal limits, no abdominal pain suggestive of SBP, melena may be related, plt normal  - Continue propranolol  - Holding diuretics due to recent dehydration   Positive anion gap metabolic acidosis  - Lactic acid normalized with IV fluids  - May be secondary to ketosis from alcoholism  -Sodium bicarbonate drip 50 ml/hr  Anemia of chronic disease and possibly blood loss, hemoglobin approximately stable since previous admission  - Continue B12 and iron supplementation   Hyponatremia, secondary to cirrhosis and beer diet, dehydration  - Sodium trending up of IV fluids   COPD -Increased O2 demand/work of breathing  - DuoNeb's QID -Pulmicort nebulizer BID -Mucinex DM BID -Solu-Medrol 60 mg daily -Flutter valve q 4hr while awake  Leukocytosis, likely secondary to sepsis, trend  Tobacco abuse -Nicotine patch 21 mg -Wellbutrin 150 mg daily x3 days; then 1 tab BID    Code Status: FULL Family Communication: no family present at time of exam Disposition Plan: Resolution sepsis    Consultants:   Procedure/Significant Events: PCXR 4/21 -Diffuse increased density is appreciated within medial and lateral segments RML  Echocardiogram 01/14/2013 -Left ventricle:  The cavity size was normal. Wall thickness was normal.  -LVEF=45% to 50%.    Culture 4/22 blood culture right arm/right hand NTD 4/22 stool culture NTD 4/22 C. difficile by PCR negative   Antibiotics: Zosyn 4/22 Vancomycin 4/22>>   DVT prophylaxis: SCDs   Devices    LINES / TUBES:    4/21  22Ga left hand 4/21  20 Ga right    Continuous Infusions: . sodium chloride 1,000 mL (10/26/13 0547)    Objective: VITAL SIGNS: Temp: 99 F (37.2 C) (04/23 1214) Temp src: Rectal (04/23 1214) BP: 124/77 mmHg (04/23 1202) Pulse Rate: 126 (04/23 0815) SPO2; FIO2:   Intake/Output Summary (Last 24 hours) at 10/27/13 1516 Last data filed at 10/27/13 1400  Gross per 24 hour  Intake   2800 ml  Output    853 ml  Net   1947 ml     Exam: General: A./O. x4, moderate respiratory distress secondary to increased work of breathing Lungs: Diffuse expiratory/expiratory wheeze  Cardiovascular: Tachycardic Regular rhythm without murmur gallop or rub normal S1 and S2 Renalbalance today;        /overall;        Creatinine ;        Hourly output   Abdomen: Nontender, nondistended, soft, bowel sounds positive, no rebound, no ascites, no appreciable mass Extremities: No significant cyanosis, clubbing, or edema bilateral lower extremities  Data Reviewed: Basic Metabolic Panel:  Recent Labs Lab 10/25/13 1930 10/26/13 0050 10/26/13 0340 10/27/13 0328  NA 123* 128* 132* 135*  K 5.0 4.2 3.9 3.4*  CL 87* 93* 98 102  CO2 13* 19 16* 18*  GLUCOSE 70 104* 105* 92  BUN 7 8 8 9   CREATININE 0.88 0.84 0.74 0.94  CALCIUM 8.8 8.6 8.6 8.1*   Liver Function Tests:  Recent Labs Lab 10/25/13 1930 10/26/13 0050 10/26/13 0340 10/27/13 0328  AST 59* 46* 47* 39*  ALT 30 27 26 20   ALKPHOS 105 95 94 83  BILITOT 0.5 0.6 0.6 0.5  PROT 7.8 7.7 7.4 6.4  ALBUMIN 3.3* 3.2* 3.1* 2.4*   No results found for this basename: LIPASE, AMYLASE,  in the last 168 hours  Recent Labs Lab 10/25/13 2155  AMMONIA 26   CBC:  Recent Labs Lab 10/25/13 1930 10/26/13 0340 10/27/13 0328  WBC 13.4* 13.2* 12.0*  NEUTROABS 11.8* 11.7*  --   HGB 9.7* 9.7* 8.2*  HCT 28.1* 27.9* 24.3*  MCV 80.1 78.6 79.7  PLT 170 172 161   Cardiac Enzymes:  Recent Labs Lab 10/25/13 2155 10/26/13 0340  10/26/13 0925  TROPONINI <0.30 <0.30 <0.30   BNP (last 3 results) No results found for this basename: PROBNP,  in the last 8760 hours CBG:  Recent Labs Lab 10/26/13 1142 10/26/13 1649 10/26/13 2139 10/27/13 0720 10/27/13 1156  GLUCAP 91 101* 117* 98 137*    Recent Results (from the past 240 hour(s))  CULTURE, BLOOD (ROUTINE X 2)     Status: None   Collection Time    10/25/13  6:30 PM      Result Value Ref Range Status   Specimen Description BLOOD RIGHT FOREARM   Final   Special Requests     Final   Value: BOTTLES DRAWN AEROBIC AND ANAEROBIC 10CCBLUE 8CCRED   Culture  Setup Time     Final   Value: 10/26/2013 00:21     Performed at [facility]   Culture     Final   Value:  BLOOD CULTURE RECEIVED NO GROWTH TO DATE CULTURE WILL BE HELD FOR 5 DAYS BEFORE ISSUING A FINAL NEGATIVE REPORT     Performed at [facility]   Report Status PENDING   Incomplete  CULTURE, BLOOD (ROUTINE X 2)     Status: None   Collection Time    10/25/13  7:24 PM      Result Value Ref Range Status   Specimen Description BLOOD RIGHT HAND   Final   Special Requests BOTTLES DRAWN AEROBIC ONLY 5 CC   Final   Culture  Setup Time     Final   Value: 10/26/2013 03:58     Performed at [facility]   Culture     Final   Value:        BLOOD CULTURE RECEIVED NO GROWTH TO DATE CULTURE WILL BE HELD FOR 5 DAYS BEFORE ISSUING A FINAL NEGATIVE REPORT     Performed at [facility]   Report Status PENDING   Incomplete  URINE CULTURE     Status: None   Collection Time    10/25/13  7:39 PM      Result Value Ref Range Status   Specimen Description URINE, CLEAN CATCH   Final   Special Requests NONE   Final   Culture  Setup Time     Final   Value: 10/26/2013 00:27     Performed at [facility] Count PENDING   Incomplete   Culture     Final   Value: Culture reincubated for better growth     Performed at [facility]   Report Status PENDING   Incomplete   CULTURE, EXPECTORATED SPUTUM-ASSESSMENT     Status: None   Collection Time    10/25/13  8:00 PM      Result Value Ref Range Status   Specimen Description SPUTUM   Final   Special Requests Normal   Final   Sputum evaluation     Final   Value: THIS SPECIMEN IS ACCEPTABLE. RESPIRATORY CULTURE REPORT TO FOLLOW.   Report Status 10/25/2013 FINAL   Final  CULTURE, RESPIRATORY (NON-EXPECTORATED)     Status: None   Collection Time    10/25/13  8:00 PM      Result Value Ref Range Status   Specimen Description SPUTUM   Final   Special Requests NONE   Final   Gram Stain     Final   Value: ABUNDANT WBC PRESENT,BOTH PMN AND MONONUCLEAR     NO SQUAMOUS EPITHELIAL CELLS SEEN     ABUNDANT GRAM NEGATIVE RODS     FEW GRAM POSITIVE COCCI IN PAIRS     Performed at [facility]   Culture     Final   Value: MODERATE HAEMOPHILUS INFLUENZAE     Note: BETA LACTAMASE POSITIVE     Performed at [facility]   Report Status 10/27/2013 FINAL   Final  MRSA PCR SCREENING     Status: None   Collection Time    10/25/13 11:44 PM      Result Value Ref Range Status   MRSA by PCR NEGATIVE  NEGATIVE Final   Comment:            The GeneXpert MRSA Assay (FDA     approved for NASAL specimens     only), is one component of a     comprehensive MRSA colonization     surveillance program. It is not  intended to diagnose MRSA     infection nor to guide or     monitor treatment for     MRSA infections.  CLOSTRIDIUM DIFFICILE BY PCR     Status: None   Collection Time    10/26/13 12:46 AM      Result Value Ref Range Status   C difficile by pcr NEGATIVE  NEGATIVE Final  STOOL CULTURE     Status: None   Collection Time    10/26/13 12:47 AM      Result Value Ref Range Status   Specimen Description STOOL   Final   Special Requests NONE   Final   Culture     Final   Value: Culture reincubated for better growth     Performed at [facility]   Report Status PENDING   Incomplete  OVA AND  PARASITE EXAMINATION     Status: None   Collection Time    10/26/13 12:47 AM      Result Value Ref Range Status   Specimen Description STOOL   Final   Special Requests NONE   Final   Ova and parasites     Final   Value: NO OVA OR PARASITES SEEN     Performed at [facility]   Report Status 10/27/2013 FINAL   Final  CULTURE, BLOOD (ROUTINE X 2)     Status: None   Collection Time    10/26/13 12:50 PM      Result Value Ref Range Status   Specimen Description BLOOD RIGHT ARM   Final   Special Requests BOTTLES DRAWN AEROBIC ONLY 6CC   Final   Culture  Setup Time     Final   Value: 10/26/2013 16:29     Performed at [facility]   Culture     Final   Value:        BLOOD CULTURE RECEIVED NO GROWTH TO DATE CULTURE WILL BE HELD FOR 5 DAYS BEFORE ISSUING A FINAL NEGATIVE REPORT     Performed at [facility]   Report Status PENDING   Incomplete  CULTURE, BLOOD (ROUTINE X 2)     Status: None   Collection Time    10/26/13  1:00 PM      Result Value Ref Range Status   Specimen Description BLOOD RIGHT HAND   Final   Special Requests BOTTLES DRAWN AEROBIC ONLY 6CC   Final   Culture  Setup Time     Final   Value: 10/26/2013 16:29     Performed at [facility]   Culture     Final   Value:        BLOOD CULTURE RECEIVED NO GROWTH TO DATE CULTURE WILL BE HELD FOR 5 DAYS BEFORE ISSUING A FINAL NEGATIVE REPORT     Performed at [facility]   Report Status PENDING   Incomplete     Studies:  Recent x-ray studies have been reviewed in detail by the Attending Physician  Scheduled Meds:  Scheduled Meds: . acamprosate  666 mg Oral TID WC  . atorvastatin  80 mg Oral Daily  . brimonidine  1 drop Both Eyes BID  . budesonide-formoterol  2 puff Inhalation BID  . ferrous sulfate  325 mg Oral Q breakfast  . folic acid  1 mg Oral Daily  . ipratropium-albuterol  3 mL Nebulization QID  . multivitamin with minerals  1 tablet Oral Daily  . pantoprazole  40 mg Oral  BID AC  . piperacillin-tazobactam (ZOSYN)  IV  3.375 g Intravenous Q8H  . propranolol  10 mg Oral TID  . thiamine  100 mg Oral Daily  . traZODone  50 mg Oral QHS  . vancomycin  750 mg Intravenous Q8H  . vitamin B-12  100 mcg Oral Daily  . vitamin E  200 Units Oral Daily    Time spent on care of this patient: 40 mins   Allie Bossier , MD   [facility] Office  [phone] Pager [phone]  On-Call/Text Page:      [email or web address]      password [email or web address]  If 7PM-7AM, please contact night-coverage [email or web address] Password [facility] 10/27/2013, 3:16 PM   LOS: 2 days

## 2013-10-27 NOTE — Care Management Note (Signed)
    Page 1 of 1   10/27/2013     2:35:15 PM CARE MANAGEMENT NOTE 10/27/2013  Patient:  Peter [name], Peter [name]   Account Number:  [id number]  Date Initiated:  10/27/2013  Documentation initiated by:  Marvetta Gibbons  Subjective/Objective Assessment:   Pt admitted with sepsis     Action/Plan:   PTA pt lived at home with caregiver-- needs PT/OT evals   Anticipated DC Date:  10/31/2013   Anticipated DC Plan:  [place]  CM consult      Choice offered to / List presented to:             Status of service:  In process, will continue to follow Medicare Important Message given?   (If response is "NO", the following Medicare IM given date fields will be blank) Date Medicare IM given:   Date Additional Medicare IM given:    Discharge Disposition:    Per UR Regulation:  Reviewed for med. necessity/level of care/duration of stay  If discussed at [place] of Stay Meetings, dates discussed:    Comments:  Contacts- Ileene Rubens- [phone],  Wilnette Kales - mother- [phone] or [phone]-cell  10/27/13- 1400- Marvetta Gibbons RN, BSN [phone] In to see pt (caregiver- Vaughan Basta) at bedside- per conversation pt was approved for PCS (80 hrs/wk) and Vaughan Basta was providing this service to pt 3-4 hrs/day- but pt has not met his Medicaid deductible so PCS services were suspended until deductible met. Pt also has [place] per Vaughan Basta they are working with pt's SW at [facility] regarding this matter- explained to pt and Vaughan Basta that his [employer] does not cover for 24/7 care but would cover [facility] services for PT/OT ect. - discussed that pt may need ST-SNF stay for rehab vs home with [facility]- will ask MD to order PT/OT evals- pt understands that ST-SNF would be about 2wks for rehab and that he would need to be agreeable to this in order for [place] to look into this option. Spoke with pt's mother [facility] over phone while in room regarding above she feels pt would benefit from a ST-SNF stay and plans to  speak to pt regarding this. NCM to cont. to follow for PT/OT recommendations.

## 2013-10-28 ENCOUNTER — Inpatient Hospital Stay (HOSPITAL_COMMUNITY): Payer: PRIVATE HEALTH INSURANCE

## 2013-10-28 DIAGNOSIS — F17200 Nicotine dependence, unspecified, uncomplicated: Secondary | ICD-10-CM

## 2013-10-28 DIAGNOSIS — K7460 Unspecified cirrhosis of liver: Secondary | ICD-10-CM

## 2013-10-28 DIAGNOSIS — J9600 Acute respiratory failure, unspecified whether with hypoxia or hypercapnia: Secondary | ICD-10-CM | POA: Diagnosis present

## 2013-10-28 DIAGNOSIS — J449 Chronic obstructive pulmonary disease, unspecified: Secondary | ICD-10-CM

## 2013-10-28 DIAGNOSIS — I5020 Unspecified systolic (congestive) heart failure: Secondary | ICD-10-CM

## 2013-10-28 DIAGNOSIS — R509 Fever, unspecified: Secondary | ICD-10-CM

## 2013-10-28 DIAGNOSIS — J80 Acute respiratory distress syndrome: Secondary | ICD-10-CM | POA: Diagnosis present

## 2013-10-28 DIAGNOSIS — J189 Pneumonia, unspecified organism: Secondary | ICD-10-CM

## 2013-10-28 DIAGNOSIS — J4489 Other specified chronic obstructive pulmonary disease: Secondary | ICD-10-CM

## 2013-10-28 DIAGNOSIS — F10931 Alcohol use, unspecified with withdrawal delirium: Secondary | ICD-10-CM

## 2013-10-28 DIAGNOSIS — F10231 Alcohol dependence with withdrawal delirium: Secondary | ICD-10-CM | POA: Diagnosis present

## 2013-10-28 LAB — COMPREHENSIVE METABOLIC PANEL
ALT: 22 U/L (ref 0–53)
AST: 37 U/L (ref 0–37)
Albumin: 2.6 g/dL — ABNORMAL LOW (ref 3.5–5.2)
Alkaline Phosphatase: 78 U/L (ref 39–117)
BUN: 11 mg/dL (ref 6–23)
CALCIUM: 8.1 mg/dL — AB (ref 8.4–10.5)
CO2: 20 mEq/L (ref 19–32)
CREATININE: 0.94 mg/dL (ref 0.50–1.35)
Chloride: 100 mEq/L (ref 96–112)
GFR calc Af Amer: >90 mL/min (ref >90)
GFR, EST NON AFRICAN AMERICAN: 89 mL/min — AB (ref >90)
Glucose, Bld: 137 mg/dL — ABNORMAL HIGH (ref 70–99)
Potassium: 3.8 mEq/L (ref 3.7–5.3)
Sodium: 135 mEq/L — ABNORMAL LOW (ref 137–147)
Total Bilirubin: 0.3 mg/dL (ref 0.3–1.2)
Total Protein: 6.8 g/dL (ref 6.0–8.3)

## 2013-10-28 LAB — CBC WITH DIFFERENTIAL/PLATELET
BASOS ABS: 0 10*3/uL (ref 0.0–0.1)
Basophils Relative: 0 % (ref 0–1)
Eosinophils Absolute: 0 10*3/uL (ref 0.0–0.7)
Eosinophils Relative: 0 % (ref 0–5)
HEMATOCRIT: 24 % — AB (ref 39.0–52.0)
Hemoglobin: 8 g/dL — ABNORMAL LOW (ref 13.0–17.0)
LYMPHS PCT: 5 % — AB (ref 12–46)
Lymphs Abs: 0.4 10*3/uL — ABNORMAL LOW (ref 0.7–4.0)
MCH: 27.3 pg (ref 26.0–34.0)
MCHC: 33.3 g/dL (ref 30.0–36.0)
MCV: 81.9 fL (ref 78.0–100.0)
Monocytes Absolute: 0.3 10*3/uL (ref 0.1–1.0)
Monocytes Relative: 4 % (ref 3–12)
NEUTROS ABS: 7.5 10*3/uL (ref 1.7–7.7)
Neutrophils Relative %: 91 % — ABNORMAL HIGH (ref 43–77)
PLATELETS: 188 10*3/uL (ref 150–400)
RBC: 2.93 MIL/uL — ABNORMAL LOW (ref 4.22–5.81)
RDW: 16.8 % — ABNORMAL HIGH (ref 11.5–15.5)
WBC: 8.2 10*3/uL (ref 4.0–10.5)

## 2013-10-28 LAB — BASIC METABOLIC PANEL
BUN: 12 mg/dL (ref 6–23)
CALCIUM: 8.3 mg/dL — AB (ref 8.4–10.5)
CO2: 22 meq/L (ref 19–32)
Chloride: 106 mEq/L (ref 96–112)
Creatinine, Ser: 1.05 mg/dL (ref 0.50–1.35)
GFR calc Af Amer: 87 mL/min — ABNORMAL LOW (ref >90)
GFR, EST NON AFRICAN AMERICAN: 75 mL/min — AB (ref >90)
Glucose, Bld: 114 mg/dL — ABNORMAL HIGH (ref 70–99)
Potassium: 3 mEq/L — ABNORMAL LOW (ref 3.7–5.3)
SODIUM: 142 meq/L (ref 137–147)

## 2013-10-28 LAB — POCT I-STAT 3, ART BLOOD GAS (G3+)
Bicarbonate: 25.1 mEq/L — ABNORMAL HIGH (ref 20.0–24.0)
O2 SAT: 100 %
PO2 ART: 391 mmHg — AB (ref 80.0–100.0)
TCO2: 26 mmol/L (ref 0–100)
pCO2 arterial: 41.7 mmHg (ref 35.0–45.0)
pH, Arterial: 7.387 (ref 7.350–7.450)

## 2013-10-28 LAB — GI PATHOGEN PANEL BY PCR, STOOL
C difficile toxin A/B: NEGATIVE
CRYPTOSPORIDIUM BY PCR: NEGATIVE
Campylobacter by PCR: NEGATIVE
E COLI (STEC): NEGATIVE
E coli (ETEC) LT/ST: NEGATIVE
E coli 0157 by PCR: NEGATIVE
G lamblia by PCR: NEGATIVE
NOROVIRUS G1/G2: NEGATIVE
Rotavirus A by PCR: NEGATIVE
SALMONELLA BY PCR: NEGATIVE
SHIGELLA BY PCR: NEGATIVE

## 2013-10-28 LAB — CBC
HCT: 21.5 % — ABNORMAL LOW (ref 39.0–52.0)
Hemoglobin: 7.3 g/dL — ABNORMAL LOW (ref 13.0–17.0)
MCH: 27.1 pg (ref 26.0–34.0)
MCHC: 34 g/dL (ref 30.0–36.0)
MCV: 79.9 fL (ref 78.0–100.0)
PLATELETS: 180 10*3/uL (ref 150–400)
RBC: 2.69 MIL/uL — AB (ref 4.22–5.81)
RDW: 16.5 % — ABNORMAL HIGH (ref 11.5–15.5)
WBC: 9.8 10*3/uL (ref 4.0–10.5)

## 2013-10-28 LAB — BLOOD GAS, ARTERIAL
Acid-base deficit: 4.5 mmol/L — ABNORMAL HIGH (ref 0.0–2.0)
BICARBONATE: 19.9 meq/L — AB (ref 20.0–24.0)
DRAWN BY: 405301
O2 CONTENT: 6 L/min
O2 SAT: 84.9 %
PCO2 ART: 36.8 mmHg (ref 35.0–45.0)
PO2 ART: 57.9 mmHg — AB (ref 80.0–100.0)
Patient temperature: 99.7
TCO2: 21 mmol/L (ref 0–100)
pH, Arterial: 7.356 (ref 7.350–7.450)

## 2013-10-28 LAB — TRIGLYCERIDES: Triglycerides: 136 mg/dL (ref <150)

## 2013-10-28 LAB — GLUCOSE, CAPILLARY
GLUCOSE-CAPILLARY: 99 mg/dL (ref 70–99)
Glucose-Capillary: 109 mg/dL — ABNORMAL HIGH (ref 70–99)
Glucose-Capillary: 78 mg/dL (ref 70–99)

## 2013-10-28 LAB — MAGNESIUM
MAGNESIUM: 1.5 mg/dL (ref 1.5–2.5)
MAGNESIUM: 1.3 mg/dL — AB (ref 1.5–2.5)

## 2013-10-28 LAB — RESPIRATORY VIRUS PANEL
Adenovirus: NOT DETECTED
INFLUENZA A H3: NOT DETECTED
INFLUENZA B 1: NOT DETECTED
Influenza A H1: NOT DETECTED
Influenza A: NOT DETECTED
Metapneumovirus: DETECTED — AB
PARAINFLUENZA 2 A: NOT DETECTED
PARAINFLUENZA 3 A: NOT DETECTED
Parainfluenza 1: NOT DETECTED
RESPIRATORY SYNCYTIAL VIRUS A: NOT DETECTED
RHINOVIRUS: NOT DETECTED
Respiratory Syncytial Virus B: NOT DETECTED

## 2013-10-28 LAB — LACTIC ACID, PLASMA: LACTIC ACID, VENOUS: 1.6 mmol/L (ref 0.5–2.2)

## 2013-10-28 MED ORDER — LORAZEPAM 2 MG/ML IJ SOLN
INTRAMUSCULAR | Status: AC
  Filled 2013-10-28: qty 2

## 2013-10-28 MED ORDER — METOPROLOL TARTRATE 1 MG/ML IV SOLN
INTRAVENOUS | Status: AC
  Filled 2013-10-28: qty 5

## 2013-10-28 MED ORDER — IPRATROPIUM-ALBUTEROL 0.5-2.5 (3) MG/3ML IN SOLN
3.0000 mL | Freq: Four times a day (QID) | RESPIRATORY_TRACT | Status: DC
  Administered 2013-10-28 – 2013-11-02 (×21): 3 mL via RESPIRATORY_TRACT
  Filled 2013-10-28 (×20): qty 3

## 2013-10-28 MED ORDER — FOLIC ACID 5 MG/ML IJ SOLN
1.0000 mg | Freq: Every day | INTRAMUSCULAR | Status: DC
  Administered 2013-10-28 – 2013-10-31 (×4): 1 mg via INTRAVENOUS
  Filled 2013-10-28 (×4): qty 0.2

## 2013-10-28 MED ORDER — SODIUM CHLORIDE 0.9 % IV SOLN: 1000.0000 mL | INTRAVENOUS | Status: DC

## 2013-10-28 MED ORDER — ACETAMINOPHEN 160 MG/5ML PO SOLN
650.0000 mg | Freq: Four times a day (QID) | ORAL | Status: DC | PRN
  Filled 2013-10-28: qty 20.3

## 2013-10-28 MED ORDER — FUROSEMIDE 10 MG/ML IJ SOLN: 80.0000 mg | Freq: Once | INTRAMUSCULAR | Status: DC

## 2013-10-28 MED ORDER — POTASSIUM CHLORIDE 20 MEQ/15ML (10%) PO LIQD
40.0000 meq | Freq: Once | ORAL | Status: AC
  Administered 2013-10-28: 40 meq
  Filled 2013-10-28: qty 30

## 2013-10-28 MED ORDER — LORAZEPAM 2 MG/ML IJ SOLN: 3.0000 mg | Freq: Once | INTRAMUSCULAR | Status: DC

## 2013-10-28 MED ORDER — PANTOPRAZOLE SODIUM 40 MG IV SOLR
40.0000 mg | Freq: Two times a day (BID) | INTRAVENOUS | Status: DC
  Administered 2013-10-28 – 2013-10-29 (×3): 40 mg via INTRAVENOUS
  Filled 2013-10-28 (×5): qty 40

## 2013-10-28 MED ORDER — PROPOFOL 10 MG/ML IV EMUL
INTRAVENOUS | Status: AC
  Filled 2013-10-28: qty 100

## 2013-10-28 MED ORDER — THIAMINE HCL 100 MG/ML IJ SOLN
100.0000 mg | Freq: Every day | INTRAMUSCULAR | Status: DC
  Administered 2013-10-28 – 2013-10-31 (×4): 100 mg via INTRAVENOUS
  Filled 2013-10-28 (×5): qty 1

## 2013-10-28 MED ORDER — MORPHINE SULFATE 2 MG/ML IJ SOLN
2.0000 mg | Freq: Once | INTRAMUSCULAR | Status: AC
  Administered 2013-10-28: 2 mg via INTRAVENOUS

## 2013-10-28 MED ORDER — CHLORHEXIDINE GLUCONATE 0.12 % MT SOLN
15.0000 mL | Freq: Two times a day (BID) | OROMUCOSAL | Status: DC
  Administered 2013-10-28 – 2013-10-30 (×4): 15 mL via OROMUCOSAL
  Filled 2013-10-28 (×4): qty 15

## 2013-10-28 MED ORDER — MIDAZOLAM HCL 2 MG/2ML IJ SOLN
INTRAMUSCULAR | Status: AC
  Administered 2013-10-28: 4 mg
  Filled 2013-10-28: qty 6

## 2013-10-28 MED ORDER — FUROSEMIDE 10 MG/ML IJ SOLN
80.0000 mg | Freq: Once | INTRAMUSCULAR | Status: AC
  Administered 2013-10-28: 80 mg via INTRAVENOUS

## 2013-10-28 MED ORDER — FUROSEMIDE 10 MG/ML IJ SOLN
INTRAMUSCULAR | Status: AC
  Filled 2013-10-28: qty 8

## 2013-10-28 MED ORDER — FENTANYL CITRATE 0.05 MG/ML IJ SOLN
100.0000 ug | INTRAMUSCULAR | Status: DC | PRN
  Filled 2013-10-28: qty 2

## 2013-10-28 MED ORDER — MIDAZOLAM HCL 5 MG/ML IJ SOLN: 4.0000 mg | Freq: Once | INTRAMUSCULAR | Status: AC

## 2013-10-28 MED ORDER — INSULIN ASPART 100 UNIT/ML ~~LOC~~ SOLN
0.0000 [IU] | SUBCUTANEOUS | Status: DC
  Administered 2013-10-30: 1 [IU] via SUBCUTANEOUS

## 2013-10-28 MED ORDER — METOPROLOL TARTRATE 1 MG/ML IV SOLN
5.0000 mg | Freq: Once | INTRAVENOUS | Status: AC
  Administered 2013-10-28: 5 mg via INTRAVENOUS
  Filled 2013-10-28: qty 5

## 2013-10-28 MED ORDER — LEVALBUTEROL HCL 1.25 MG/0.5ML IN NEBU
1.2500 mg | INHALATION_SOLUTION | Freq: Once | RESPIRATORY_TRACT | Status: AC
  Administered 2013-10-28: 1.25 mg via RESPIRATORY_TRACT
  Filled 2013-10-28: qty 0.5

## 2013-10-28 MED ORDER — MIDAZOLAM HCL 2 MG/2ML IJ SOLN
2.0000 mg | INTRAMUSCULAR | Status: DC | PRN
  Administered 2013-10-28: 2 mg via INTRAVENOUS
  Filled 2013-10-28: qty 2

## 2013-10-28 MED ORDER — MORPHINE SULFATE 2 MG/ML IJ SOLN
INTRAMUSCULAR | Status: AC
  Filled 2013-10-28: qty 1

## 2013-10-28 MED ORDER — DEXTROSE-NACL 5-0.9 % IV SOLN
INTRAVENOUS | Status: DC
  Administered 2013-10-28: 17:00:00 via INTRAVENOUS

## 2013-10-28 MED ORDER — MIDAZOLAM HCL 2 MG/2ML IJ SOLN
2.0000 mg | INTRAMUSCULAR | Status: DC | PRN
  Administered 2013-10-28: 2 mg via INTRAVENOUS

## 2013-10-28 MED ORDER — FENTANYL CITRATE 0.05 MG/ML IJ SOLN
100.0000 ug | Freq: Once | INTRAMUSCULAR | Status: AC
  Administered 2013-10-28: 100 ug via INTRAVENOUS

## 2013-10-28 MED ORDER — ETOMIDATE 2 MG/ML IV SOLN
20.0000 mg | Freq: Once | INTRAVENOUS | Status: AC
  Administered 2013-10-28: 20 mg via INTRAVENOUS

## 2013-10-28 MED ORDER — BIOTENE DRY MOUTH MT LIQD
15.0000 mL | Freq: Four times a day (QID) | OROMUCOSAL | Status: DC
  Administered 2013-10-28 – 2013-10-31 (×11): 15 mL via OROMUCOSAL

## 2013-10-28 MED ORDER — FENTANYL CITRATE 0.05 MG/ML IJ SOLN
INTRAMUSCULAR | Status: AC
  Filled 2013-10-28: qty 4

## 2013-10-28 MED ORDER — KCL IN DEXTROSE-NACL 20-5-0.45 MEQ/L-%-% IV SOLN
INTRAVENOUS | Status: DC
Start: 2013-10-28 — End: 2013-10-30
  Administered 2013-10-28 – 2013-10-29 (×2): via INTRAVENOUS
  Filled 2013-10-28 (×3): qty 1000

## 2013-10-28 MED ORDER — PROPOFOL 10 MG/ML IV EMUL
0.0000 ug/kg/min | INTRAVENOUS | Status: DC
  Administered 2013-10-28: 25 ug/kg/min via INTRAVENOUS
  Administered 2013-10-28: 40 ug/kg/min via INTRAVENOUS
  Administered 2013-10-29 – 2013-10-30 (×6): 50 ug/kg/min via INTRAVENOUS
  Administered 2013-10-30: 40 ug/kg/min via INTRAVENOUS
  Filled 2013-10-28 (×9): qty 100

## 2013-10-28 NOTE — Progress Notes (Signed)
Pt tx to 2MW01 d/t increased work of breathing even with Bipap, continuous elevated HR, and temp still has cooling blanket on. Family at bedside spoke with MD at length about tx and plan of care.  Report provided to RN on 2MW. All belongs packed and sent with family.

## 2013-10-28 NOTE — Progress Notes (Signed)
Pt is tachypneic and requiring more O2. Placed on 100% NRB mask, SpO2 90s. NP notified. New orders received. Will carry out and continue to monitor.

## 2013-10-28 NOTE — Progress Notes (Signed)
PT Cancellation Note  Patient Details Name: Peter [name] MRN: [id number] DOB: [birth year]   Cancelled Treatment:    Reason Eval/Treat Not Completed: Medical issues which prohibited therapy. Pt in respiratory distress at the moment with multiple nurses and RT present. Pt not appropriate for therapy at this time. Will plan to re-evaluate when pt is medically ready.    Emma, [place] 10/28/2013, 8:46 AM

## 2013-10-28 NOTE — Progress Notes (Signed)
[facility] TEAM 1 - Stepdown/ICU TEAM Progress Note  Peter [name] NPY:[id number] DOB: Jan 18, 1951 DOA: 10/25/2013 PCP: No primary provider on file.  Admit HPI / Brief Narrative: 63 y.o. BM PMHx heavy alcohol abuse, COPD, liver cirrhosis, presenting with healthcare associated pneumonia, sepsis, melanotic stools. The patient was discharged from the hospital back in January for weakness in the setting of alcohol intoxication and dehydration/malnutrition. Per the family, patient had 24-hour care at home. However, patient is on begin this kind of care for about the past month. Family states the patient has been drinking beer "all day every day ". Patient is a progressive cough for the past 3-4 days as well as no fever and sputum production. Family also stated the patient has had several loose bowel movements well today. They have been black and tarry as well as foul-smelling. Has baseline increased BMs. This has been worsening per family. Pt denies any abd pain. Has had CP assd with coughing as well as chronic LBP.  On presentation to ER. Tmax 103, HR 130s-150s in setting of dehydration. SBPs 120s-180s. SaO2 intially in low 80s, however has been in mid 90s both on and off supplemental O2. Code sepsis called. WBC 13.4. Na 123, bicarb 13 with AG 23. ETOH level @ 40. Lactate initally at 3.5 then 2 s/p 1L NS bolus. CXR with RML PNA. UA indicative of infection. Pt started on vanc and cefepime for HCAP coverage. 4/23 patient with increased work of breathing can only talk in 2-3 word sentences   HPI/Subjective: 4/24 patient at bedside by RN patient has increasing SOB, decreasing SpO2. On arrival patient confused unable to speak. Patient and placed on Ventimask and SpO2 still in the high 80s to low 90s. PCXR obtained which showed pulmonary edema fluids decreased to KVO, Lasix IV 80 mg x1, morphine IV 2 mg x1, and patient based on BiPAP. Patient's SpO2 increased to 100% on BiPAP at 40% FiO2 -1530 paged by patient's  RN Ottis Stain that although we have placed the patient on BiPAP earlier in the day treated with Lasix IV 80 mg, morphine IV 2 mg and increased his Ativan to 2 mg q2hr patient was still struggling with WOB, and had minimal UOP. In addition she informed me that patient now was coughing up thick yellow sputum tinged with brown. Requested that she contact the PCCM RN and requested patient be rounded on for possible intubation. Ordered additional Lasix IV 80 mg, Ativan IV 3 mg, and requested that RN Ottis Stain prepare family for possible intubation, and let them know I was on my way to patient's bedside. Met Dr. Titus Mould and Dr.Sood (PCCM) at patient's bedside and they agreed that patient needed to be intubated.  Assessment/Plan: Severe Sepsis due to HCAP and pyelonephritis  - Continue vancomycin + Zosyn (better aspiration coverage) - Strep pneumo negative  - Legionella negative   - Respiratory viral panel pending  -4/24 Lactic acid 1.6   Pulmonary edema/ARDS picture 4/24 Patient's PCXR shows moving toward ARDS; will attempt to reverse patient's pulmonary trend. -DC normal salin -DC sodium bicarbonate drip -Lasix IV 80 mg x1 -Morphine IV 2 mg x 1 -Place patient on BiPAP -Strict in and out   Melena & diarrhea  - Occult pending  - C. difficile PCR negative -GI pathogen panel by PCR pending - Fecal lactoferrin, ova and parasite, and stool culture are NTD - Continue to avoid NSAIDs, antiplatelets, anticoagulation  - Increase PPI to twice a day  - Check INR  Ethanol abuse  - Continue CIWA protocol  - Continue thiamine, folate, multivitamin  - Continue propranolol 10 mg TID - Continue acamprosate   Sinus tachycardia, likely secondary to sepsis versus dehydration and alcohol withdrawal  - Continue beta blocker,IV fluids DC'd secondary to pulmonary edema - Continue antibiotics   Cirrhosis, ammonia level within normal limits, no abdominal pain suggestive of SBP, melena may be related,  plt normal  - Continue propranolol  - 12/24 Lasix IV 80 mg x1    Positive anion gap metabolic acidosis  - Lactic acid normalized with IV fluids  - May be secondary to ketosis from alcoholism  -DC Sodium bicarbonate drip 50 ml/hr  Anemia of chronic disease and possibly blood loss, hemoglobin approximately stable since previous admission  - Continue B12 and iron supplementation   Hyponatremia, secondary to cirrhosis and beer diet, dehydration  - Sodium stable at 158mq/L   COPD -Increased O2 demand/work of breathing  - DuoNeb's QID -Pulmicort nebulizer BID -Mucinex DM BID -Solu-Medrol 60 mg daily -Flutter valve q 4hr while awake  Leukocytosis,  -Resolved   Tobacco abuse -Nicotine patch 21 mg -Wellbutrin 150 mg daily x3 days; then 1 tab BID    Code Status: FULL Family Communication: no family present at time of exam Disposition Plan: Resolution sepsis    Consultants:   Procedure/Significant Events: PCXR 10/28/2013 -Worsening diffuse bilateral airspace disease, consistent with increased pulmonary edema.  -Persistent right perihilar consolidation ; superimposed pneumonia   PCXR 4/21 -Diffuse increased density is appreciated within medial and lateral segments RML  Echocardiogram 01/14/2013 -Left ventricle: The cavity size was normal. Wall thickness was normal.  -LVEF=45% to 50%.    Culture 4/22 blood culture right arm/right hand NTD 4/22 stool culture NTD 4/22 C. difficile by PCR negative   Antibiotics: Zosyn 4/22>> Vancomycin 4/22>>   DVT prophylaxis: SCDs   Devices    LINES / TUBES:  4/21  22Ga left hand 4/21  20 Ga right    Continuous Infusions: . sodium chloride 1,000 mL (10/28/13 0800)  .  sodium bicarbonate 150 mEq in sterile water 1000 mL infusion 50 mL/hr at 10/27/13 2126    Objective: VITAL SIGNS: Temp: 100.1 F (37.8 C) (04/24 0807) Temp src: Rectal (04/24 0807) BP: 152/98 mmHg (04/24 0807) Pulse Rate: 121 (04/24 0901) SPO2;  100% on BiPAP FIO2: 40%   Intake/Output Summary (Last 24 hours) at 10/28/13 1017 Last data filed at 10/28/13 0800  Gross per 24 hour  Intake   1870 ml  Output    575 ml  Net   1295 ml     Exam: General: A./O. x0, moderate-severe respiratory distress secondary to increased work of breathing  Lungs: Apical inspiratory /expiratory wheeze bilateral, bilateral rales and remainder of lung  Cardiovascular: Tachycardic Regular rhythm without murmur gallop or rub normal S1 and S2 Renalbalance today;        /overall;        Creatinine ;        Hourly output   Abdomen: Nontender, nondistended, soft, bowel sounds positive, no rebound, no ascites, no appreciable mass Extremities: No significant cyanosis, clubbing, or edema bilateral lower extremities  Data Reviewed: Basic Metabolic Panel:  Recent Labs Lab 10/25/13 1930 10/26/13 0050 10/26/13 0340 10/27/13 0328 10/28/13 0331  NA 123* 128* 132* 135* 135*  K 5.0 4.2 3.9 3.4* 3.8  CL 87* 93* 98 102 100  CO2 13* 19 16* 18* 20  GLUCOSE 70 104* 105* 92 137*  BUN 7 8 8  9  11  CREATININE 0.88 0.84 0.74 0.94 0.94  CALCIUM 8.8 8.6 8.6 8.1* 8.1*  MG  --   --   --   --  1.5   Liver Function Tests:  Recent Labs Lab 10/25/13 1930 10/26/13 0050 10/26/13 0340 10/27/13 0328 10/28/13 0331  AST 59* 46* 47* 39* 37  ALT 30 27 26 20 22   ALKPHOS 105 95 94 83 78  BILITOT 0.5 0.6 0.6 0.5 0.3  PROT 7.8 7.7 7.4 6.4 6.8  ALBUMIN 3.3* 3.2* 3.1* 2.4* 2.6*   No results found for this basename: LIPASE, AMYLASE,  in the last 168 hours  Recent Labs Lab 10/25/13 2155  AMMONIA 26   CBC:  Recent Labs Lab 10/25/13 1930 10/26/13 0340 10/27/13 0328 10/28/13 0331  WBC 13.4* 13.2* 12.0* 8.2  NEUTROABS 11.8* 11.7*  --  7.5  HGB 9.7* 9.7* 8.2* 8.0*  HCT 28.1* 27.9* 24.3* 24.0*  MCV 80.1 78.6 79.7 81.9  PLT 170 172 161 188   Cardiac Enzymes:  Recent Labs Lab 10/25/13 2155 10/26/13 0340 10/26/13 0925  TROPONINI <0.30 <0.30 <0.30   BNP  (last 3 results) No results found for this basename: PROBNP,  in the last 8760 hours CBG:  Recent Labs Lab 10/26/13 2139 10/27/13 0720 10/27/13 1156 10/27/13 1706 10/27/13 2147  GLUCAP 117* 98 137* 100* 163*    Recent Results (from the past 240 hour(s))  CULTURE, BLOOD (ROUTINE X 2)     Status: None   Collection Time    10/25/13  6:30 PM      Result Value Ref Range Status   Specimen Description BLOOD RIGHT FOREARM   Final   Special Requests     Final   Value: BOTTLES DRAWN AEROBIC AND ANAEROBIC 10CCBLUE 8CCRED   Culture  Setup Time     Final   Value: 10/26/2013 00:21     Performed at [facility]   Culture     Final   Value:        BLOOD CULTURE RECEIVED NO GROWTH TO DATE CULTURE WILL BE HELD FOR 5 DAYS BEFORE ISSUING A FINAL NEGATIVE REPORT     Performed at [facility]   Report Status PENDING   Incomplete  CULTURE, BLOOD (ROUTINE X 2)     Status: None   Collection Time    10/25/13  7:24 PM      Result Value Ref Range Status   Specimen Description BLOOD RIGHT HAND   Final   Special Requests BOTTLES DRAWN AEROBIC ONLY 5 CC   Final   Culture  Setup Time     Final   Value: 10/26/2013 03:58     Performed at [facility]   Culture     Final   Value:        BLOOD CULTURE RECEIVED NO GROWTH TO DATE CULTURE WILL BE HELD FOR 5 DAYS BEFORE ISSUING A FINAL NEGATIVE REPORT     Performed at [facility]   Report Status PENDING   Incomplete  URINE CULTURE     Status: None   Collection Time    10/25/13  7:39 PM      Result Value Ref Range Status   Specimen Description URINE, CLEAN CATCH   Final   Special Requests NONE   Final   Culture  Setup Time     Final   Value: 10/26/2013 00:27     Performed at [facility]     Final  Value: >=100,000 COLONIES/ML     Performed at [facility]   Culture     Final   Value: Multiple bacterial morphotypes present, none predominant. Suggest appropriate recollection if clinically  indicated.     Performed at [facility]   Report Status 10/27/2013 FINAL   Final  CULTURE, EXPECTORATED SPUTUM-ASSESSMENT     Status: None   Collection Time    10/25/13  8:00 PM      Result Value Ref Range Status   Specimen Description SPUTUM   Final   Special Requests Normal   Final   Sputum evaluation     Final   Value: THIS SPECIMEN IS ACCEPTABLE. RESPIRATORY CULTURE REPORT TO FOLLOW.   Report Status 10/25/2013 FINAL   Final  CULTURE, RESPIRATORY (NON-EXPECTORATED)     Status: None   Collection Time    10/25/13  8:00 PM      Result Value Ref Range Status   Specimen Description SPUTUM   Final   Special Requests NONE   Final   Gram Stain     Final   Value: ABUNDANT WBC PRESENT,BOTH PMN AND MONONUCLEAR     NO SQUAMOUS EPITHELIAL CELLS SEEN     ABUNDANT GRAM NEGATIVE RODS     FEW GRAM POSITIVE COCCI IN PAIRS     Performed at [facility]   Culture     Final   Value: MODERATE HAEMOPHILUS INFLUENZAE     Note: BETA LACTAMASE POSITIVE     Performed at [facility]   Report Status 10/27/2013 FINAL   Final  MRSA PCR SCREENING     Status: None   Collection Time    10/25/13 11:44 PM      Result Value Ref Range Status   MRSA by PCR NEGATIVE  NEGATIVE Final   Comment:            The GeneXpert MRSA Assay (FDA     approved for NASAL specimens     only), is one component of a     comprehensive MRSA colonization     surveillance program. It is not     intended to diagnose MRSA     infection nor to guide or     monitor treatment for     MRSA infections.  CLOSTRIDIUM DIFFICILE BY PCR     Status: None   Collection Time    10/26/13 12:46 AM      Result Value Ref Range Status   C difficile by pcr NEGATIVE  NEGATIVE Final  STOOL CULTURE     Status: None   Collection Time    10/26/13 12:47 AM      Result Value Ref Range Status   Specimen Description STOOL   Final   Special Requests NONE   Final   Culture     Final   Value: Culture reincubated for better  growth     Performed at [facility]   Report Status PENDING   Incomplete  OVA AND PARASITE EXAMINATION     Status: None   Collection Time    10/26/13 12:47 AM      Result Value Ref Range Status   Specimen Description STOOL   Final   Special Requests NONE   Final   Ova and parasites     Final   Value: NO OVA OR PARASITES SEEN     Performed at [facility]   Report Status 10/27/2013 FINAL   Final  CULTURE, BLOOD (  ROUTINE X 2)     Status: None   Collection Time    10/26/13 12:50 PM      Result Value Ref Range Status   Specimen Description BLOOD RIGHT ARM   Final   Special Requests BOTTLES DRAWN AEROBIC ONLY [facility]   Final   Culture  Setup Time     Final   Value: 10/26/2013 16:29     Performed at [facility]   Culture     Final   Value:        BLOOD CULTURE RECEIVED NO GROWTH TO DATE CULTURE WILL BE HELD FOR 5 DAYS BEFORE ISSUING A FINAL NEGATIVE REPORT     Performed at [facility]   Report Status PENDING   Incomplete  CULTURE, BLOOD (ROUTINE X 2)     Status: None   Collection Time    10/26/13  1:00 PM      Result Value Ref Range Status   Specimen Description BLOOD RIGHT HAND   Final   Special Requests BOTTLES DRAWN AEROBIC ONLY 6CC   Final   Culture  Setup Time     Final   Value: 10/26/2013 16:29     Performed at [facility]   Culture     Final   Value:        BLOOD CULTURE RECEIVED NO GROWTH TO DATE CULTURE WILL BE HELD FOR 5 DAYS BEFORE ISSUING A FINAL NEGATIVE REPORT     Performed at [facility]   Report Status PENDING   Incomplete     Studies:  Recent x-ray studies have been reviewed in detail by the Attending Physician  Scheduled Meds:  Scheduled Meds: . acamprosate  666 mg Oral TID WC  . atorvastatin  80 mg Oral Daily  . brimonidine  1 drop Both Eyes BID  . budesonide (PULMICORT) nebulizer solution  0.25 mg Nebulization BID  . buPROPion  150 mg Oral Daily  . dextromethorphan-guaiFENesin  1 tablet Oral BID  .  ferrous sulfate  325 mg Oral Q breakfast  . folic acid  1 mg Oral Daily  . ipratropium-albuterol  3 mL Nebulization QID  . methylPREDNISolone (SOLU-MEDROL) injection  60 mg Intravenous Q24H  . multivitamin with minerals  1 tablet Oral Daily  . nicotine  21 mg Transdermal Daily  . pantoprazole  40 mg Oral BID AC  . piperacillin-tazobactam (ZOSYN)  IV  3.375 g Intravenous Q8H  . propranolol  10 mg Oral TID  . thiamine  100 mg Oral Daily  . traZODone  50 mg Oral QHS  . vancomycin  750 mg Intravenous Q8H  . vitamin B-12  100 mcg Oral Daily  . vitamin E  200 Units Oral Daily    Time spent on care of this patient: 151mns   CAllie Bossier, MD   [facility] Office  [phone] [phone] On-Call/Text Page:      [email or web address]      password [email or web address]  If 7PM-7AM, please contact night-coverage [email or web address] Password [email or web address]4/24/2015, 10:17 AM   LOS: 3 days

## 2013-10-28 NOTE — Procedures (Signed)
Central Venous Catheter Insertion Procedure Note TYNAN BOESEL [id number] 11-Jul-1950  Procedure: Insertion of Central Venous Catheter Indications: Drug and/or fluid administration  Procedure Details Consent: Risks of procedure as well as the alternatives and risks of each were explained to the (patient/caregiver).  Consent for procedure obtained. Time Out: Verified patient identification, verified procedure, site/side was marked, verified correct patient position, special equipment/implants available, medications/allergies/relevent history reviewed, required imaging and test results available.  Performed  Maximum sterile technique was used including antiseptics, cap, gloves, gown, hand hygiene, mask and sheet. Skin prep: Chlorhexidine; local anesthetic administered A antimicrobial bonded/coated triple lumen catheter was placed in the right internal jugular vein using the Seldinger technique.  Evaluation Blood flow good Complications: No apparent complications Patient did tolerate procedure well. Chest X-ray ordered to verify placement.  CXR: pending.  Placed by Marni Griffon, ACNP.  I was present for procedure.  Chesley Mires, MD [facility] Pulmonary/Critical Care 10/28/2013, 4:27 PM Pager:  [phone] After 3pm call: [phone]

## 2013-10-28 NOTE — Progress Notes (Signed)
[facility] Progress Note Patient Name: Peter [name] DOB: [birth year] MRN: [id number]  Date of Service  10/28/2013   HPI/Events of Note     eICU Interventions  K+ repleted    Intervention Category Intermediate Interventions: Electrolyte abnormality - evaluation and management  Wilhelmina Mcardle 10/28/2013, 8:01 PM

## 2013-10-28 NOTE — Progress Notes (Signed)
Sputum sample sent per order

## 2013-10-28 NOTE — Progress Notes (Signed)
OT Cancellation Note  Patient Details Name: Peter [name] MRN: [id number] DOB: 24-Dec-1950   Cancelled Treatment:    Reason Eval/Treat Not Completed: Medical issues which prohibited therapy Res[piratory distress. Will see when medically stable.  [place], [place]  [phone] 10/28/2013 10/28/2013, 12:20 PM

## 2013-10-28 NOTE — Consult Note (Signed)
PULMONARY / CRITICAL CARE MEDICINE   Name: Peter [name] MRN: [id number] DOB: 08/15/1950    ADMISSION DATE:  10/25/2013 CONSULTATION DATE:  10/28/2013  REFERRING MD :  Sherral Hammers  PRIMARY SERVICE:  [facility]-->PCCM   CHIEF COMPLAINT: Short or breath  BRIEF PATIENT DESCRIPTION:  63 yo male smoker, alcoholic admitted 4/40 with melanotic stools, cough, sputum.  Found to have pneumonia.  Developed respiratory failure, and delirium tremens >> transferred to ICU and PCCM assumed care.  SIGNIFICANT EVENTS: 4/21 Admit 4/24 DT's, respiratory failure >> failed BiPAP >> to ICU, VDRF.  STUDIES:    LINES / TUBES: ETT 4/24 >> CVL 4/24 >>   CULTURES: Stool culture 4/22>>> resp viral panel 4/23>>> UC 4/21: >100K ult orgs Ova and parasite 4/22: neg  Sputum culture 4/21: MODERATE HAEMOPHILUS INFLUENZAE. Note: BETA LACTAMASE POSITIVE BCX2 4/22>>> BCX2 4/21>>> Cdiff 4/21: neg   ANTIBIOTICS: Cefepime 4/21>>>4/22 Zosyn 4/22>>> vanc  4/22>>>  HISTORY OF PRESENT ILLNESS:   63 y.o. year old male with noted prior history of heavy alcohol abuse, COPD, liver cirrhosis, presenting 4/21 w/ 3-4 day h/o progressive cough. Family also stated the patient has had several loose bowel movements well today. They have been black and tarry as well as foul-smelling. This had been worsening per family. Had CP assd with coughing as well as chronic LBP. On presentation to ER. Tmax 103, HR 130s-150s in setting of dehydration. SBPs 120s-180s. SaO2 intially in low 80s, however has been in mid 90s both on and off supplemental O2. Code sepsis called. WBC 13.4. Na 123, bicarb 13 with AG 23. Admitted w/ working dx of PNA and sepsis.   Of note: the patient was discharged from the hospital back in January for weakness in the setting of alcohol intoxication and dehydration/malnutrition. Per the family, patient had 24-hour care at home. However, patient is on begin this kind of care for about the past month. Family states the patient  has been drinking beer "all day every day."  He was started on abx, CIWA protocol.  He developed worsening mental status and respiratory distress 4/24.  He was tried on BiPAP w/o improvement, and no improvement with diuresis.  He was transferred to ICU for intubation and PCCM assumed care.  PAST MEDICAL HISTORY :  Past Medical History  Diagnosis Date  . Hypertension   . Hyperlipemia   . Emphysema   . Stroke   . Chronic pain   . Cirrhosis of liver   . Fall   . Dental injury   . Bone spur of other site     Left Foot  . Impaired mobility   . Total self care deficit   . Pneumonia   . Emphysema   . ETOH abuse   . Elevated LFTs   . Arthritis   . Anxiety   . Sinusitis   . Anemia     iron def.  . Hard of hearing   . Diabetes mellitus without complication   . COPD (chronic obstructive pulmonary disease)   . Depression   . Glaucoma   . Emphysema of lung   . Cataract    Past Surgical History  Procedure Laterality Date  . Colonoscopy  01/06/2012    Procedure: COLONOSCOPY;  Surgeon: Beryle Beams, MD;  Location: [facility] ENDOSCOPY;  Service: Endoscopy;  Laterality: N/A;  . Esophagogastroduodenoscopy  01/06/2012    Procedure: ESOPHAGOGASTRODUODENOSCOPY (EGD);  Surgeon: Beryle Beams, MD;  Location: [facility] ENDOSCOPY;  Service: Endoscopy;  Laterality: N/A;  . Esophagogastroduodenoscopy  01/09/2012    Procedure: ESOPHAGOGASTRODUODENOSCOPY (EGD);  Surgeon: Beryle Beams, MD;  Location: [facility] ENDOSCOPY;  Service: Endoscopy;  Laterality: N/A;  . Multiple extractions with alveoloplasty  03/29/2012    Procedure: MULTIPLE EXTRACION WITH ALVEOLOPLASTY;  Surgeon: Gae Bon, DDS;  Location: [place];  Service: Oral Surgery;  Laterality: Bilateral;  . Bone spur  2013    left spur  . Cataract extraction w/ intraocular lens implant    . Mouth surgery    . Colonoscopy     Prior to Admission medications   Medication Sig Start Date End Date Taking? Authorizing Provider  acamprosate (CAMPRAL) 333 MG tablet  Take 666 mg by mouth 3 (three) times daily with meals.   Yes Historical Provider, MD  albuterol (PROVENTIL HFA;VENTOLIN HFA) 108 (90 BASE) MCG/ACT inhaler Inhale 2 puffs into the lungs 4 (four) times daily.   Yes Historical Provider, MD  atorvastatin (LIPITOR) 80 MG tablet Take 1 tablet (80 mg total) by mouth daily. 02/16/13  Yes Angelica Chessman, MD  brimonidine (ALPHAGAN) 0.2 % ophthalmic solution Place 1 drop into both eyes 2 (two) times daily.   Yes Historical Provider, MD  budesonide-formoterol (SYMBICORT) 80-4.5 MCG/ACT inhaler Inhale 2 puffs into the lungs 2 (two) times daily.   Yes Historical Provider, MD  clotrimazole (LOTRIMIN) 1 % cream Apply topically 2 (two) times daily. 08/04/13  Yes Domenic Polite, MD  ferrous sulfate 325 (65 FE) MG tablet Take 1 tablet (325 mg total) by mouth daily with breakfast. For low iron. 02/16/13  Yes Angelica Chessman, MD  folic acid (FOLVITE) 1 MG tablet Take 1 mg by mouth daily.   Yes Historical Provider, MD  HYDROcodone-acetaminophen (NORCO/VICODIN) 5-325 MG per tablet Take 1 tablet by mouth every 6 (six) hours as needed for moderate pain.   Yes Historical Provider, MD  hydrOXYzine (ATARAX/VISTARIL) 25 MG tablet Take 25 mg by mouth 3 (three) times daily as needed for anxiety.   Yes Historical Provider, MD  Multiple Vitamin (MULTIVITAMIN) tablet Take 1 tablet by mouth daily. 02/16/13  Yes Angelica Chessman, MD  pantoprazole (PROTONIX) 40 MG tablet Take 40 mg by mouth daily.   Yes Historical Provider, MD  propranolol (INDERAL) 10 MG tablet Take 1 tablet (10 mg total) by mouth 3 (three) times daily. 06/07/13  Yes Thurnell Lose, MD  thiamine (VITAMIN B-1) 100 MG tablet Take 1 tablet (100 mg total) by mouth daily. For vitamin B deficiency. 02/16/13  Yes Angelica Chessman, MD  traZODone (DESYREL) 50 MG tablet Take 1 tablet (50 mg total) by mouth at bedtime. 02/16/13  Yes Angelica Chessman, MD  vitamin B-12 (CYANOCOBALAMIN) 100 MCG tablet Take 1 tablet (100 mcg total)  by mouth daily. For low B12. 02/16/13  Yes Angelica Chessman, MD  vitamin E 200 UNIT capsule Take 1 capsule (200 Units total) by mouth daily. 02/16/13  Yes Angelica Chessman, MD   Allergies  Allergen Reactions  . Poison Ivy Extract [employer] Poison Ivy] Rash    FAMILY HISTORY:  Family History  Problem Relation Age of Onset  . Breast cancer Mother   . Diabetes Maternal Aunt   . Heart disease Sister   . Heart disease Maternal Aunt    SOCIAL HISTORY:  reports that he has been smoking Cigarettes.  He has a 60 pack-year smoking history. He has never used smokeless tobacco. He reports that he drinks about 2.4 ounces of alcohol per week. He reports that he does not use illicit drugs.  REVIEW OF SYSTEMS:  Unable to obtain due to respiratory distress and mental status.  SUBJECTIVE:  Currently in acute distress.  VITAL SIGNS: Temp:  [99.1 F (37.3 C)-100.1 F (37.8 C)] 100.1 F (37.8 C) (04/24 0807) Pulse Rate:  [81-124] 124 (04/24 1246) Resp:  [26-38] 31 (04/24 1246) BP: (132-152)/(85-98) 152/98 mmHg (04/24 0807) SpO2:  [92 %-100 %] 97 % (04/24 1246) FiO2 (%):  [40 %-100 %] 40 % (04/24 1246) Weight:  [72.5 kg (159 lb 13.3 oz)] 72.5 kg (159 lb 13.3 oz) (04/24 1029) HEMODYNAMICS:   VENTILATOR SETTINGS: Vent Mode:  [-]  FiO2 (%):  [40 %-100 %] 40 % INTAKE / OUTPUT: Intake/Output     04/23 0701 - 04/24 0700 04/24 0701 - 04/25 0700   P.O. 120    I.V. (mL/kg) 1875 (28.1) 50 (0.7)   IV Piggyback 250 50   Total Intake(mL/kg) 2245 (33.6) 100 (1.4)   Urine (mL/kg/hr) 575 (0.4)    Stool 2 (0)    Total Output 577     Net +1668 +100          PHYSICAL EXAMINATION: General: confused, agitated and tremulous, currently in acute distress Neuro:  Sp slurred. Follows some commands tremulous  HEENT:  Edentulous, no JVD   Cardiovascular:  Rrr, tachycardic  Lungs:  Diffuse rales, Marked accessory muscle use  Abdomen:  Soft, + bowel sounds  Musculoskeletal:  Intact  Skin:  Diaphoretic    LABS:  CBC  Recent Labs Lab 10/26/13 0340 10/27/13 0328 10/28/13 0331  WBC 13.2* 12.0* 8.2  HGB 9.7* 8.2* 8.0*  HCT 27.9* 24.3* 24.0*  PLT 172 161 188   Coag's  Recent Labs Lab 10/27/13 0328  INR 1.17   BMET  Recent Labs Lab 10/26/13 0340 10/27/13 0328 10/28/13 0331  NA 132* 135* 135*  K 3.9 3.4* 3.8  CL 98 102 100  CO2 16* 18* 20  BUN 8 9 11   CREATININE 0.74 0.94 0.94  GLUCOSE 105* 92 137*   Electrolytes  Recent Labs Lab 10/26/13 0340 10/27/13 0328 10/28/13 0331  CALCIUM 8.6 8.1* 8.1*  MG  --   --  1.5   Sepsis Markers  Recent Labs Lab 10/25/13 1942 10/25/13 2004 10/28/13 0331  LATICACIDVEN 3.50* 2.0 1.6   ABG  Recent Labs Lab 10/28/13 0524  PHART 7.356  PCO2ART 36.8  PO2ART 57.9*   Liver Enzymes  Recent Labs Lab 10/26/13 0340 10/27/13 0328 10/28/13 0331  AST 47* 39* 37  ALT 26 20 22   ALKPHOS 94 83 78  BILITOT 0.6 0.5 0.3  ALBUMIN 3.1* 2.4* 2.6*   Cardiac Enzymes  Recent Labs Lab 10/25/13 2155 10/26/13 0340 10/26/13 0925  TROPONINI <0.30 <0.30 <0.30   Glucose  Recent Labs Lab 10/26/13 2139 10/27/13 0720 10/27/13 1156 10/27/13 1706 10/27/13 2147 10/28/13 1202  GLUCAP 117* 98 137* 100* 163* 109*    Imaging Dg Chest Port 1 View  10/28/2013   CLINICAL DATA:  Worsening hypoxia. Pneumonia. Alcohol abuse. Smoker.  EXAM: PORTABLE CHEST - 1 VIEW  COMPARISON:  10/25/2013  FINDINGS: Worsening diffuse bilateral airspace disease is seen, suspicious for worsening pulmonary edema. There is persistent right perihilar consolidation which may represent superimposed pneumonia. Heart size is stable and within normal limits.  IMPRESSION: Worsening diffuse bilateral airspace disease, consistent with increased pulmonary edema.  Persistent right perihilar consolidation ; superimposed pneumonia cannot be excluded.   Electronically Signed   By: Earle Gell M.D.   On: 10/28/2013 08:27     CXR:  Worsening bilateral airspace  disease.    ASSESSMENT / PLAN:  PULMONARY A:  Acute respiratory failure in setting of PNA-->evolving ARDS.  Tobacco abuse, hx of COPD.  P:   Intubate F/u cxr and abg BD's Hold steroids for now  CARDIOVASCULAR A:  SIRS/SEPSIS in setting of PNA/ UTI. Hx of Cardiomyopathy (EF 45 to 50% from 01/14/13), HTN, hyperlipidemia. P:  Central access CVP monitoring  IV fluids  RENAL A:   Mild hyponatremia  P:   Renal dose meds Avoid hypotension  Keep even to possibly positive fluid balance depending on hemodynamics.   GASTROINTESTINAL A:   FOB + stool >> Suspect gastritis due to ETOH (last EGD 23/55/73) Hx of alcoholic cirrhosis Protein calorie malnutrition  P:   PPI BID Place gastric tube Hold off on nutrition for 24 hrs to make sure hgb stable w/ no evidence of worsening bleeding   HEMATOLOGIC A:   Anemia FOB +, hgb drift from admit 9.7-->8 Heavy etoh hx. Likely gastritis in setting of ETOH  P:  PPI BID  Transfuse for hgb <7 SCD for DVT prevention  INFECTIOUS A:   Haemophilus influenza PNA (beta lactamase positive) ? Element of aspiration  UTI (mult orgs >100K) Severe sepsis P:   F/u culture data Continue vanc and zosyn   ENDOCRINE A:   Hyperglycemia. P:   SSI  NEUROLOGIC A:   Acute encephalopathy: prob element of sepsis AND alcohol related w/d P:   PAD protocol: diprivan  Thiamine, folic acid  OPTHALMOLOGY A: Hx of glaucoma. P: Continue alphagan eye drops   Updated family about plan.  D/w Dr. Sherral Hammers.  CC time 60 minutes.  Chesley Mires, MD [facility] Pulmonary/Critical Care 10/28/2013, 4:20 PM Pager:  [phone] After 3pm call: [phone]

## 2013-10-28 NOTE — Procedures (Signed)
Intubation Procedure Note Peter [name] [id number] April 21, 1951  Procedure: Intubation Indications: Respiratory insufficiency  Procedure Details Consent: Risks of procedure as well as the alternatives and risks of each were explained to the (patient/caregiver).  Consent for procedure obtained. Time Out: Verified patient identification, verified procedure, site/side was marked, verified correct patient position, special equipment/implants available, medications/allergies/relevent history reviewed, required imaging and test results available.  Performed  Maximum sterile technique was used including gloves, hand hygiene and mask.  MAC and 3  Given 4 mg versed, 100 mcg fentanyl, 20 mg etomidate.  #8 ETT inserted to 24 cm at lip.   Evaluation Hemodynamic Status: BP stable throughout; O2 sats: stable throughout Patient's Current Condition: stable Complications: No apparent complications Patient did tolerate procedure well. Chest X-ray ordered to verify placement.  CXR: pending.   Performed by Marni Griffon, ACNP.  I was present for procedure.  Chesley Mires, MD [facility] Pulmonary/Critical Care 10/28/2013, 4:09 PM Pager:  [phone] After 3pm call: [phone]

## 2013-10-29 ENCOUNTER — Inpatient Hospital Stay (HOSPITAL_COMMUNITY): Payer: PRIVATE HEALTH INSURANCE

## 2013-10-29 DIAGNOSIS — J9589 Other postprocedural complications and disorders of respiratory system, not elsewhere classified: Secondary | ICD-10-CM

## 2013-10-29 DIAGNOSIS — A419 Sepsis, unspecified organism: Secondary | ICD-10-CM

## 2013-10-29 DIAGNOSIS — R6520 Severe sepsis without septic shock: Secondary | ICD-10-CM

## 2013-10-29 DIAGNOSIS — F1010 Alcohol abuse, uncomplicated: Secondary | ICD-10-CM

## 2013-10-29 LAB — GLUCOSE, CAPILLARY
GLUCOSE-CAPILLARY: 102 mg/dL — AB (ref 70–99)
GLUCOSE-CAPILLARY: 89 mg/dL (ref 70–99)
GLUCOSE-CAPILLARY: 94 mg/dL (ref 70–99)
GLUCOSE-CAPILLARY: 99 mg/dL (ref 70–99)
Glucose-Capillary: 114 mg/dL — ABNORMAL HIGH (ref 70–99)
Glucose-Capillary: 92 mg/dL (ref 70–99)

## 2013-10-29 LAB — CBC
HCT: 21.9 % — ABNORMAL LOW (ref 39.0–52.0)
Hemoglobin: 7.4 g/dL — ABNORMAL LOW (ref 13.0–17.0)
MCH: 27.1 pg (ref 26.0–34.0)
MCHC: 33.8 g/dL (ref 30.0–36.0)
MCV: 80.2 fL (ref 78.0–100.0)
PLATELETS: 188 10*3/uL (ref 150–400)
RBC: 2.73 MIL/uL — ABNORMAL LOW (ref 4.22–5.81)
RDW: 16.5 % — AB (ref 11.5–15.5)
WBC: 7.8 10*3/uL (ref 4.0–10.5)

## 2013-10-29 LAB — COMPREHENSIVE METABOLIC PANEL
ALBUMIN: 2.4 g/dL — AB (ref 3.5–5.2)
ALT: 17 U/L (ref 0–53)
AST: 29 U/L (ref 0–37)
Alkaline Phosphatase: 66 U/L (ref 39–117)
BUN: 11 mg/dL (ref 6–23)
CALCIUM: 8.4 mg/dL (ref 8.4–10.5)
CO2: 22 mEq/L (ref 19–32)
Chloride: 107 mEq/L (ref 96–112)
Creatinine, Ser: 0.91 mg/dL (ref 0.50–1.35)
GFR calc Af Amer: >90 mL/min (ref >90)
GFR calc non Af Amer: >90 mL/min (ref >90)
Glucose, Bld: 105 mg/dL — ABNORMAL HIGH (ref 70–99)
Potassium: 3.4 mEq/L — ABNORMAL LOW (ref 3.7–5.3)
SODIUM: 142 meq/L (ref 137–147)
Total Bilirubin: 0.4 mg/dL (ref 0.3–1.2)
Total Protein: 6.6 g/dL (ref 6.0–8.3)

## 2013-10-29 LAB — MAGNESIUM: Magnesium: 1.4 mg/dL — ABNORMAL LOW (ref 1.5–2.5)

## 2013-10-29 MED ORDER — MAGNESIUM SULFATE 4000MG/100ML IJ SOLN
4.0000 g | Freq: Once | INTRAMUSCULAR | Status: AC
  Administered 2013-10-29: 4 g via INTRAVENOUS
  Filled 2013-10-29: qty 100

## 2013-10-29 MED ORDER — VITAL HIGH PROTEIN PO LIQD
1000.0000 mL | ORAL | Status: DC
  Filled 2013-10-29 (×2): qty 1000

## 2013-10-29 MED ORDER — PANTOPRAZOLE SODIUM 40 MG PO PACK
40.0000 mg | PACK | Freq: Two times a day (BID) | ORAL | Status: DC
  Administered 2013-10-29 – 2013-10-30 (×2): 40 mg
  Filled 2013-10-29 (×4): qty 20

## 2013-10-29 MED ORDER — VITAL HIGH PROTEIN PO LIQD
1000.0000 mL | ORAL | Status: DC
  Administered 2013-10-29: 1000 mL
  Filled 2013-10-29 (×3): qty 1000

## 2013-10-29 MED ORDER — PRO-STAT SUGAR FREE PO LIQD
30.0000 mL | Freq: Two times a day (BID) | ORAL | Status: DC
  Administered 2013-10-29 – 2013-10-30 (×3): 30 mL
  Filled 2013-10-29 (×4): qty 30

## 2013-10-29 NOTE — Progress Notes (Addendum)
INITIAL NUTRITION ASSESSMENT  DOCUMENTATION CODES Per approved criteria  -Not Applicable   INTERVENTION: Initiate TF via OGT of Vital High Protein at 25 ml/hr with 30 ml Prostat BID. This will provide: 800 kcal, 83 grams protein, and 502 ml free water. This regimen with current propofol infusion will provide: 1375 kcal daily. Suspect pt is at some level of refeeding syndrome risk given chronic medical issues and limited intake 2/2 alcoholism. Unit RD to further advance nutrition support as electrolytes stabilize if warranted. RD to continue to follow nutrition care plan.  NUTRITION DIAGNOSIS: Inadequate oral intake related to inability to eat as evidenced by npo status.   Goal: Intake to meet >90% of estimated nutrition needs.  Monitor:  weight trends, lab trends, I/O's, vent status, TF tolerance  Reason for Assessment: MD Consult for Initiation of Nutrition Support  63 y.o. male  Admitting Dx: sepsis  ASSESSMENT: PMHx significant for heavy ETOH abuse, COPD, liver cirrhosis. Admitted with HCAP, sepsis and melanotic stools.   Per family's report in H&P, pt has been drinking beer "all day every day" x 1 month. Placed on CIWA protocol on admission. Developed respiratory distress and DT's on 4/24 and was transferred to ICU and intubated.  Patient likely with malnutrition, however unable to make diagnosis at this time.  Patient is currently intubated on ventilator support MV: 13.8 L/min Temp (24hrs), Avg:99.2 F (37.3 C), Min:98.5 F (36.9 C), Max:100.1 F (37.8 C)  Propofol: 21.8 ml/hr --> provides 575 kcal daily; RN confirms that this rate will likely not change today  Potassium low at 3.4 --> ordered for IV KCl Magnesium low at 1.4 --> ordered for IV mag sulfate Phosphorus pending CBG's: 89-99  Height: Ht Readings from Last 1 Encounters:  10/25/13 5' 7.5" (1.715 m)    Weight: Wt Readings from Last 1 Encounters:  10/29/13 159 lb 13.3 oz (72.5 kg)  Admission weight  154 lb  Ideal Body Weight: 151 lb/68.6 kg  % Ideal Body Weight: 106%  Wt Readings from Last 10 Encounters:  10/29/13 159 lb 13.3 oz (72.5 kg)  08/01/13 154 lb 1.6 oz (69.9 kg)  06/07/13 149 lb 9.6 oz (67.858 kg)  05/19/13 142 lb (64.411 kg)  04/26/13 142 lb 2 oz (64.467 kg)  04/06/13 141 lb 9.6 oz (64.229 kg)  02/16/13 140 lb (63.504 kg)  01/18/13 137 lb 9.1 oz (62.4 kg)  09/01/12 144 lb 10 oz (65.6 kg)  03/25/12 146 lb 6.4 oz (66.407 kg)    Usual Body Weight: 140 - 145 lb  % Usual Body Weight: 111%  BMI:  Body mass index is 24.65 kg/(m^2). WNL  Estimated Nutritional Needs: Kcal: 1974 Protein: 100 - 120 g Fluid: approx 2 liters daily  Skin: intact  Diet Order: NPO  EDUCATION NEEDS: -Education not appropriate at this time   Intake/Output Summary (Last 24 hours) at 10/29/13 0811 Last data filed at 10/29/13 0600  Gross per 24 hour  Intake   1270 ml  Output   1175 ml  Net     95 ml    Last BM: 4/23  Labs:   Recent Labs Lab 10/28/13 0331 10/28/13 1700 10/29/13 0445  NA 135* 142 142  K 3.8 3.0* 3.4*  CL 100 106 107  CO2 20 22 22   BUN 11 12 11   CREATININE 0.94 1.05 0.91  CALCIUM 8.1* 8.3* 8.4  MG 1.5 1.3* 1.4*  GLUCOSE 137* 114* 105*    CBG (last 3)   Recent Labs  10/28/13 2356  10/29/13 0411 10/29/13 0734  GLUCAP 99 89 94    Scheduled Meds: . antiseptic oral rinse  15 mL Mouth Rinse QID  . brimonidine  1 drop Both Eyes BID  . chlorhexidine  15 mL Mouth Rinse BID  . feeding supplement (PRO-STAT SUGAR FREE 64)  30 mL Per Tube BID  . feeding supplement (VITAL HIGH PROTEIN)  1,000 mL Per Tube Q24H  . folic acid  1 mg Intravenous Daily  . insulin aspart  0-9 Units Subcutaneous 6 times per day  . ipratropium-albuterol  3 mL Nebulization Q6H  . pantoprazole (PROTONIX) IV  40 mg Intravenous Q12H  . piperacillin-tazobactam (ZOSYN)  IV  3.375 g Intravenous Q8H  . thiamine IV  100 mg Intravenous Daily  . vancomycin  750 mg Intravenous Q8H     Continuous Infusions: . dextrose 5 % and 0.45 % NaCl with KCl 20 mEq/L 50 mL/hr at 10/28/13 2050  . propofol 50 mcg/kg/min (10/29/13 8338)    Past Medical History  Diagnosis Date  . Hypertension   . Hyperlipemia   . Emphysema   . Stroke   . Chronic pain   . Cirrhosis of liver   . Fall   . Dental injury   . Bone spur of other site     Left Foot  . Impaired mobility   . Total self care deficit   . Pneumonia   . Emphysema   . ETOH abuse   . Elevated LFTs   . Arthritis   . Anxiety   . Sinusitis   . Anemia     iron def.  . Hard of hearing   . Diabetes mellitus without complication   . COPD (chronic obstructive pulmonary disease)   . Depression   . Glaucoma   . Emphysema of lung   . Cataract     Past Surgical History  Procedure Laterality Date  . Colonoscopy  01/06/2012    Procedure: COLONOSCOPY;  Surgeon: Beryle Beams, MD;  Location: [facility] ENDOSCOPY;  Service: Endoscopy;  Laterality: N/A;  . Esophagogastroduodenoscopy  01/06/2012    Procedure: ESOPHAGOGASTRODUODENOSCOPY (EGD);  Surgeon: Beryle Beams, MD;  Location: [facility] ENDOSCOPY;  Service: Endoscopy;  Laterality: N/A;  . Esophagogastroduodenoscopy  01/09/2012    Procedure: ESOPHAGOGASTRODUODENOSCOPY (EGD);  Surgeon: Beryle Beams, MD;  Location: [facility] ENDOSCOPY;  Service: Endoscopy;  Laterality: N/A;  . Multiple extractions with alveoloplasty  03/29/2012    Procedure: MULTIPLE EXTRACION WITH ALVEOLOPLASTY;  Surgeon: Gae Bon, DDS;  Location: [place];  Service: Oral Surgery;  Laterality: Bilateral;  . Bone spur  2013    left spur  . Cataract extraction w/ intraocular lens implant    . Mouth surgery    . Colonoscopy      Inda Coke MS, RD, LDN Inpatient Registered Dietitian Pager: [phone] After-hours pager: [phone]

## 2013-10-29 NOTE — Progress Notes (Signed)
ANTIBIOTIC CONSULT NOTE - FOLLOW UP  Pharmacy Consult for Vancomycin + Zosyn Indication: H influenzae PNA and r/o sepsis  Allergies  Allergen Reactions  . Poison Ivy Extract [Extract Of Poison Ivy] Rash    Patient Measurements: Height: 5' 7.5" (171.5 cm) Weight: 159 lb 13.3 oz (72.5 kg) IBW/kg (Calculated) : 67.25  Vital Signs: Temp: 98.9 F (37.2 C) (04/25 0800) Temp src: Oral (04/25 0800) BP: 115/70 mmHg (04/25 1030) Pulse Rate: 109 (04/25 1030) Intake/Output from previous day: 04/24 0701 - 04/25 0700 In: 1541.8 [I.V.:991.8; IV Piggyback:550] Out: 1475 [Urine:1475] Intake/Output from this shift: Total I/O In: 315.4 [I.V.:215.4; IV Piggyback:100] Out: 230 [Urine:230]  Labs:  Recent Labs  10/28/13 0331 10/28/13 1700 10/29/13 0445  WBC 8.2 9.8 7.8  HGB 8.0* 7.3* 7.4*  PLT 188 180 188  CREATININE 0.94 1.05 0.91   Estimated Creatinine Clearance: 82.2 ml/min (by C-G formula based on Cr of 0.91). No results found for this basename: VANCOTROUGH, VANCOPEAK, VANCORANDOM, [place], [place], [place], [place], TOBRAPEAK, TOBRARND, AMIKACINPEAK, AMIKACINTROU, AMIKACIN,  in the last 72 hours   Microbiology: Recent Results (from the past 720 hour(s))  CULTURE, BLOOD (ROUTINE X 2)     Status: None   Collection Time    10/25/13  6:30 PM      Result Value Ref Range Status   Specimen Description BLOOD RIGHT FOREARM   Final   Special Requests     Final   Value: BOTTLES DRAWN AEROBIC AND ANAEROBIC [place]   Culture  Setup Time     Final   Value: 10/26/2013 00:21     Performed at [facility]   Culture     Final   Value:        BLOOD CULTURE RECEIVED NO GROWTH TO DATE CULTURE WILL BE HELD FOR 5 DAYS BEFORE ISSUING A FINAL NEGATIVE REPORT     Performed at [facility]   Report Status PENDING   Incomplete  CULTURE, BLOOD (ROUTINE X 2)     Status: None   Collection Time    10/25/13  7:24 PM      Result Value Ref Range Status   Specimen  Description BLOOD RIGHT HAND   Final   Special Requests BOTTLES DRAWN AEROBIC ONLY 5 CC   Final   Culture  Setup Time     Final   Value: 10/26/2013 03:58     Performed at [facility]   Culture     Final   Value:        BLOOD CULTURE RECEIVED NO GROWTH TO DATE CULTURE WILL BE HELD FOR 5 DAYS BEFORE ISSUING A FINAL NEGATIVE REPORT     Performed at [facility]   Report Status PENDING   Incomplete  URINE CULTURE     Status: None   Collection Time    10/25/13  7:39 PM      Result Value Ref Range Status   Specimen Description URINE, CLEAN CATCH   Final   Special Requests NONE   Final   Culture  Setup Time     Final   Value: 10/26/2013 00:27     Performed at [place]     Final   Value: >=100,000 COLONIES/ML     Performed at [facility]   Culture     Final   Value: Multiple bacterial morphotypes present, none predominant. Suggest appropriate recollection if clinically indicated.     Performed at [facility]  Report Status 10/27/2013 FINAL   Final  CULTURE, EXPECTORATED SPUTUM-ASSESSMENT     Status: None   Collection Time    10/25/13  8:00 PM      Result Value Ref Range Status   Specimen Description SPUTUM   Final   Special Requests Normal   Final   Sputum evaluation     Final   Value: THIS SPECIMEN IS ACCEPTABLE. RESPIRATORY CULTURE REPORT TO FOLLOW.   Report Status 10/25/2013 FINAL   Final  CULTURE, RESPIRATORY (NON-EXPECTORATED)     Status: None   Collection Time    10/25/13  8:00 PM      Result Value Ref Range Status   Specimen Description SPUTUM   Final   Special Requests NONE   Final   Gram Stain     Final   Value: ABUNDANT WBC PRESENT,BOTH PMN AND MONONUCLEAR     NO SQUAMOUS EPITHELIAL CELLS SEEN     ABUNDANT GRAM NEGATIVE RODS     FEW GRAM POSITIVE COCCI IN PAIRS     Performed at [facility]   Culture     Final   Value: MODERATE HAEMOPHILUS INFLUENZAE     Note: BETA LACTAMASE POSITIVE      Performed at [facility]   Report Status 10/27/2013 FINAL   Final  MRSA PCR SCREENING     Status: None   Collection Time    10/25/13 11:44 PM      Result Value Ref Range Status   MRSA by PCR NEGATIVE  NEGATIVE Final   Comment:            The GeneXpert MRSA Assay (FDA     approved for NASAL specimens     only), is one component of a     comprehensive MRSA colonization     surveillance program. It is not     intended to diagnose MRSA     infection nor to guide or     monitor treatment for     MRSA infections.  CLOSTRIDIUM DIFFICILE BY PCR     Status: None   Collection Time    10/26/13 12:46 AM      Result Value Ref Range Status   C difficile by pcr NEGATIVE  NEGATIVE Final  STOOL CULTURE     Status: None   Collection Time    10/26/13 12:47 AM      Result Value Ref Range Status   Specimen Description STOOL   Final   Special Requests NONE   Final   Culture     Final   Value: NO SUSPICIOUS COLONIES, CONTINUING TO HOLD     Performed at [facility]   Report Status PENDING   Incomplete  OVA AND PARASITE EXAMINATION     Status: None   Collection Time    10/26/13 12:47 AM      Result Value Ref Range Status   Specimen Description STOOL   Final   Special Requests NONE   Final   Ova and parasites     Final   Value: NO OVA OR PARASITES SEEN     Performed at [facility]   Report Status 10/27/2013 FINAL   Final  CULTURE, BLOOD (ROUTINE X 2)     Status: None   Collection Time    10/26/13 12:50 PM      Result Value Ref Range Status   Specimen Description BLOOD RIGHT ARM   Final   Special Requests BOTTLES DRAWN AEROBIC  ONLY [facility]   Final   Culture  Setup Time     Final   Value: 10/26/2013 16:29     Performed at [facility]   Culture     Final   Value:        BLOOD CULTURE RECEIVED NO GROWTH TO DATE CULTURE WILL BE HELD FOR 5 DAYS BEFORE ISSUING A FINAL NEGATIVE REPORT     Performed at [facility]   Report Status PENDING   Incomplete   CULTURE, BLOOD (ROUTINE X 2)     Status: None   Collection Time    10/26/13  1:00 PM      Result Value Ref Range Status   Specimen Description BLOOD RIGHT HAND   Final   Special Requests BOTTLES DRAWN AEROBIC ONLY 6CC   Final   Culture  Setup Time     Final   Value: 10/26/2013 16:29     Performed at [facility]   Culture     Final   Value:        BLOOD CULTURE RECEIVED NO GROWTH TO DATE CULTURE WILL BE HELD FOR 5 DAYS BEFORE ISSUING A FINAL NEGATIVE REPORT     Performed at [facility]   Report Status PENDING   Incomplete  RESPIRATORY VIRUS PANEL     Status: Abnormal   Collection Time    10/27/13  7:52 PM      Result Value Ref Range Status   Source - RVPAN NASOPHARYNGEAL   Final   Respiratory Syncytial Virus A NOT DETECTED   Final   Respiratory Syncytial Virus B NOT DETECTED   Final   Influenza A NOT DETECTED   Final   Influenza B NOT DETECTED   Final   Parainfluenza 1 NOT DETECTED   Final   Parainfluenza 2 NOT DETECTED   Final   Parainfluenza 3 NOT DETECTED   Final   Metapneumovirus DETECTED (*)  Final   Rhinovirus NOT DETECTED   Final   Adenovirus NOT DETECTED   Final   Influenza A H1 NOT DETECTED   Final   Influenza A H3 NOT DETECTED   Final   Comment: (NOTE)           Normal Reference Range for each Analyte: NOT DETECTED     Testing performed using the Luminex xTAG Respiratory Viral Panel test     kit.     This test was developed and its performance characteristics determined     by [facility]. It has not been cleared or approved by the [place]     Food and Drug Administration. This test is used for clinical purposes.     It should not be regarded as investigational or for research. This     laboratory is certified under the [place] (CLIA) as qualified to perform high complexity     clinical laboratory testing.     Performed at [place], RESPIRATORY (NON-EXPECTORATED)     Status:  None   Collection Time    10/28/13  4:49 PM      Result Value Ref Range Status   Specimen Description ENDOTRACHEAL   Final   Special Requests NONE   Final   Gram Stain     Final   Value: FEW WBC PRESENT,BOTH PMN AND MONONUCLEAR     NO SQUAMOUS EPITHELIAL CELLS SEEN     NO ORGANISMS SEEN  Performed at [facility] PENDING   Incomplete   Report Status PENDING   Incomplete    Anti-infectives   Start     Dose/Rate Route Frequency Ordered Stop   10/26/13 2000  ceFEPIme (MAXIPIME) 1 g in dextrose 5 % 50 mL IVPB  Status:  Discontinued     1 g 100 mL/hr over 30 Minutes Intravenous Every 12 hours 10/25/13 2105 10/25/13 2137   10/26/13 1000  piperacillin-tazobactam (ZOSYN) IVPB 3.375 g     3.375 g 12.5 mL/hr over 240 Minutes Intravenous Every 8 hours 10/26/13 0810     10/26/13 0600  ceFEPIme (MAXIPIME) 1 g in dextrose 5 % 50 mL IVPB  Status:  Discontinued     1 g 100 mL/hr over 30 Minutes Intravenous Every 12 hours 10/25/13 2137 10/26/13 0756   10/26/13 0400  vancomycin (VANCOCIN) IVPB 750 mg/150 ml premix     750 mg 150 mL/hr over 60 Minutes Intravenous Every 8 hours 10/25/13 2105     10/25/13 1915  ceFEPIme (MAXIPIME) 2 g in dextrose 5 % 50 mL IVPB     2 g 100 mL/hr over 30 Minutes Intravenous  Once 10/25/13 1901 10/25/13 1958   10/25/13 1915  vancomycin (VANCOCIN) IVPB 1000 mg/200 mL premix     1,000 mg 200 mL/hr over 60 Minutes Intravenous  Once 10/25/13 1901 10/25/13 2051      Assessment: 63 y.o. [name] who continues on Vancomycin + Zosyn for H influenzae PNA and r/o sepsis. The patient was noted to have worsening respiratory status on 4/24 requiring intubation and transfer to the ICU. New cultures have been drawn and are pending. Tmax/24h: 100.1, WBC wnl, SCr 0.91, CrCl~80 ml/min. Doses remain appropriate.  If the new cultures do not grow GPC, Vancomycin can likely be discontinued since Zosyn will cover H influenzae PNA. Will follow-up culture results.  Goal of  Therapy:  Vancomycin trough level 15-20 mcg/ml Proper antibiotics for infection/cultures adjusted for renal/hepatic function   Plan:  1. Continue Vancomycin 750 mg IV every 8 hours 2. Continue Zosyn 3.375g IV every 8 hours (infused over 4 hours) 3. Will continue to follow renal function, culture results, LOT, and antibiotic de-escalation plans   Alycia Rossetti, PharmD, BCPS Clinical Pharmacist Pager: [phone] 10/29/2013 10:47 AM

## 2013-10-29 NOTE — Procedures (Signed)
Bronchoscopy Procedure Note TAMON PARKERSON [id number] 06/07/1951  Procedure: Bronchoscopy Indications: Diagnostic evaluation of the airways, Obtain specimens for culture and/or other diagnostic studies and Remove secretions  Procedure Details Consent: Risks of procedure as well as the alternatives and risks of each were explained to the (patient/caregiver).  Consent for procedure obtained. Time Out: Verified patient identification, verified procedure, site/side was marked, verified correct patient position, special equipment/implants available, medications/allergies/relevent history reviewed, required imaging and test results available.  Performed  In preparation for procedure, patient was given 100% FiO2 and bronchoscope lubricated. Sedation: Fentanyl and Propofol  [place] of the RML was very small and tight, completely obstructed with thick secretions that were all removed.  Airway entered and the following bronchi were examined: RUL, RML, RLL, LUL, LLL and Bronchi.   Bronchoscope removed.    Evaluation Hemodynamic Status: BP stable throughout; O2 sats: stable throughout Patient's Current Condition: stable Specimens:  Sent purulent fluid Complications: No apparent complications Patient did tolerate procedure well.   Rush Farmer 10/29/2013

## 2013-10-29 NOTE — Progress Notes (Signed)
UR Completed.  Monetta Lick Jane Marykay Mccleod [phone] 10/29/2013  

## 2013-10-29 NOTE — Progress Notes (Signed)
PULMONARY / CRITICAL CARE MEDICINE   Name: Peter [name] MRN: [id number] DOB: 03-Feb-1951    ADMISSION DATE:  10/25/2013 CONSULTATION DATE:  10/28/2013  REFERRING MD :  Sherral Hammers  PRIMARY SERVICE:  [facility]-->PCCM   CHIEF COMPLAINT: Short or breath  BRIEF PATIENT DESCRIPTION:  63 yo male smoker, alcoholic admitted 5/63 with melanotic stools, cough, sputum.  Found to have pneumonia.  Developed respiratory failure, and delirium tremens >> transferred to ICU and PCCM assumed care.  SIGNIFICANT EVENTS: 4/21 Admit 4/24 DT's, respiratory failure >> failed BiPAP >> to ICU, VDRF.  STUDIES:  EEG 4/25>>>  LINES / TUBES: ETT 4/24 >> CVL 4/24 >>   CULTURES: Stool culture 4/22>>> resp viral panel 4/23>>>Metapneumovirus  Ova and parasite 4/22: neg  Sputum culture 4/21: MODERATE HAEMOPHILUS INFLUENZAE. Note: BETA LACTAMASE POSITIVE BCX2 4/22>>> BCX2 4/21>>> Cdiff 4/21: neg   ANTIBIOTICS: Cefepime 4/21>>>4/22 Zosyn 4/22>>> vanc  4/22>>>   SUBJECTIVE:  Sedated on vent .  VITAL SIGNS: Temp:  [98.5 F (36.9 C)-100.1 F (37.8 C)] 98.5 F (36.9 C) (04/25 0400) Pulse Rate:  [31-277] 91 (04/25 0253) Resp:  [13-39] 18 (04/25 0600) BP: (90-153)/(58-116) 122/88 mmHg (04/25 0600) SpO2:  [89 %-100 %] 100 % (04/25 0728) FiO2 (%):  [30 %-100 %] 30 % (04/25 0728) Weight:  [72.5 kg (159 lb 13.3 oz)] 72.5 kg (159 lb 13.3 oz) (04/25 0500) HEMODYNAMICS: CVP:  [8 mmHg-11 mmHg] 9 mmHg VENTILATOR SETTINGS: Vent Mode:  [-] PRVC FiO2 (%):  [30 %-100 %] 30 % Set Rate:  [14 bmp] 14 bmp Vt Set:  [149 mL] 620 mL PEEP:  [5 cmH20] 5 cmH20 Plateau Pressure:  [18 cmH20-30 cmH20] 18 cmH20 INTAKE / OUTPUT:  Intake/Output Summary (Last 24 hours) at 10/29/13 0810 Last data filed at 10/29/13 0600  Gross per 24 hour  Intake   1270 ml  Output   1175 ml  Net     95 ml    PHYSICAL EXAMINATION: General: sedated on precedex, tremulous w/ stimulation  Neuro:  Sedated HEENT:  Edentulous, no JVD  Orally  intubated  Cardiovascular:  Rrr, tachycardic  Lungs:  Diffuse rales, improved Abdomen:  Soft, + bowel sounds  Musculoskeletal:  Intact  Skin:  Diaphoretic   LABS:  CBC  Recent Labs Lab 10/28/13 0331 10/28/13 1700 10/29/13 0445  WBC 8.2 9.8 7.8  HGB 8.0* 7.3* 7.4*  HCT 24.0* 21.5* 21.9*  PLT 188 180 188   Coag's  Recent Labs Lab 10/27/13 0328  INR 1.17   BMET  Recent Labs Lab 10/28/13 0331 10/28/13 1700 10/29/13 0445  NA 135* 142 142  K 3.8 3.0* 3.4*  CL 100 106 107  CO2 20 22 22   BUN 11 12 11   CREATININE 0.94 1.05 0.91  GLUCOSE 137* 114* 105*   Electrolytes  Recent Labs Lab 10/28/13 0331 10/28/13 1700 10/29/13 0445  CALCIUM 8.1* 8.3* 8.4  MG 1.5 1.3* 1.4*   Sepsis Markers  Recent Labs Lab 10/25/13 1942 10/25/13 2004 10/28/13 0331  LATICACIDVEN 3.50* 2.0 1.6   ABG  Recent Labs Lab 10/28/13 0524 10/28/13 1654  PHART 7.356 7.387  PCO2ART 36.8 41.7  PO2ART 57.9* 391.0*   Liver Enzymes  Recent Labs Lab 10/27/13 0328 10/28/13 0331 10/29/13 0445  AST 39* 37 29  ALT 20 22 17   ALKPHOS 83 78 66  BILITOT 0.5 0.3 0.4  ALBUMIN 2.4* 2.6* 2.4*   Cardiac Enzymes  Recent Labs Lab 10/25/13 2155 10/26/13 0340 10/26/13 0925  TROPONINI <0.30 <0.30 <0.30  Glucose  Recent Labs Lab 10/27/13 2147 10/28/13 1202 10/28/13 1600 10/28/13 1906 10/28/13 2356 10/29/13 0411  GLUCAP 163* 109* 78 99 99 89    Imaging Dg Chest Port 1 View  10/29/2013   CLINICAL DATA:  Followup pneumonia  EXAM: PORTABLE CHEST - 1 VIEW  COMPARISON:  Prior chest x-ray 10/28/2013  FINDINGS: The endotracheal tube is 4.1 cm above the carinal. A nasogastric tube has been placed. The tip overlies the gastric fundus. Stable position of right IJ approach central venous catheter. Catheter tip at the superior cavoatrial junction. Progressive right middle lobe atelectasis. Similar appearance of right upper lobe and bilateral basilar patchy airspace opacities. Mild vascular  congestion without overt edema. Small right pleural effusion. No pneumothorax. No acute osseous abnormality.  IMPRESSION: 1. Progressed right middle lobe atelectasis, possibly secondary to mucous plugging. 2. Similar appearance of patchy airspace opacity in the right upper lobe and bilateral bases. 3. The tip of the newly placed nasogastric tube projects over the gastric fundus. Other support apparatus in unchanged position. 4. Small right pleural effusion.   Electronically Signed   By: Jacqulynn Cadet M.D.   On: 10/29/2013 07:37   Dg Chest Port 1 View  10/28/2013   CLINICAL DATA:  Line placement.  EXAM: PORTABLE CHEST - 1 VIEW  COMPARISON:  Single view of the chest 10/28/2013 at 8:00 a.m.  FINDINGS: The patient has a new endotracheal tube in place with tip in good position at the level of the clavicular heads. Right IJ approach Swan-Ganz catheter tip projects in the lower superior vena cava. There is no pneumothorax. Bilateral airspace disease, worse on the right, is not markedly changed.  IMPRESSION: ET tube and NG tube project in good position. No pneumothorax or other change.   Electronically Signed   By: Inge Rise M.D.   On: 10/28/2013 16:43   Dg Chest Port 1 View  10/28/2013   CLINICAL DATA:  Worsening hypoxia. Pneumonia. Alcohol abuse. Smoker.  EXAM: PORTABLE CHEST - 1 VIEW  COMPARISON:  10/25/2013  FINDINGS: Worsening diffuse bilateral airspace disease is seen, suspicious for worsening pulmonary edema. There is persistent right perihilar consolidation which may represent superimposed pneumonia. Heart size is stable and within normal limits.  IMPRESSION: Worsening diffuse bilateral airspace disease, consistent with increased pulmonary edema.  Persistent right perihilar consolidation ; superimposed pneumonia cannot be excluded.   Electronically Signed   By: Earle Gell M.D.   On: 10/28/2013 08:27   Dg Abd Portable 1v  10/28/2013   CLINICAL DATA:  OG tube placement  EXAM: PORTABLE ABDOMEN - 1  VIEW  COMPARISON:  None.  FINDINGS: Enteric tube terminates in the left upper abdomen, incompletely visualized, but likely within the gastric cardia.  Nonspecific bowel gas pattern, with mildly prominent loops of small and large bowel. The appearance favors adynamic ileus over small bowel obstruction.  Degenerative changes of the lumbar spine  IMPRESSION: Enteric tube terminates in the left upper abdomen, incompletely visualized, but likely within the gastric cardia.  Possible adynamic ileus.   Electronically Signed   By: Julian Hy M.D.   On: 10/28/2013 17:47     CXR:  Diffuse infiltrates have improved. Has prob worsening RML collapse/ mucous plugging   ASSESSMENT / PLAN:  PULMONARY A:  Acute respiratory failure in setting of PNA/ALI and RML mucous plugging/collapse  Tobacco abuse, hx of COPD.  Some improvement in aeration.  P:   Cont full vent support given on going DTs. Cont abx (see ID section). Cont  DuoNeb QID. Bronch today for RML collapse.  CARDIOVASCULAR A:  SIRS/SEPSIS in setting of PNA/ UTI. Hx of Cardiomyopathy (EF 45 to 50% from 01/14/13), HTN, hyperlipidemia. P:  CVP monitoring  IV fluids-->keep even vol status   RENAL A:   Mild hyponatremia  Hypokalemia  Hypomag P:   Renal dose meds Replace K & Mg  Avoid hypotension  Keep even volume status   GASTROINTESTINAL A:   FOB + stool >> Suspect gastritis due to ETOH (last EGD 43/15/40) Hx of alcoholic cirrhosis Protein calorie malnutrition  P:   PPI BID Start nutrition (PEPup trial)  HEMATOLOGIC A:   Anemia FOB +, hgb drift from admit 9.7-->7.4 Heavy etoh hx. Likely gastritis in setting of ETOH  hgb seems to have settled out over last 12 hrs.  P:  PPI BID  Transfuse for hgb <7 SCD for DVT prevention If hgb drops significantly again would consider consult GI  INFECTIOUS A:   Haemophilus influenza PNA (beta lactamase positive) and Metapneumovirus  ? Element of aspiration  UTI (mult orgs  >100K) Severe sepsis H/o acinetobacter UTI (jan 2015) P:   F/u culture data Continue vanc and zosyn  Will contact ID Nurse, don't think he needs resp isolation anymore.   ENDOCRINE A:   Hyperglycemia. P:   SSI  NEUROLOGIC A:   Acute encephalopathy: prob element of sepsis AND alcohol related w/d >very tremulous. Appears to mostly co-inside w/ agitation so likely just DTs, but seizure on d-dx.  P:   PAD protocol: diprivan  Thiamine, folic acid EEG pending   OPTHALMOLOGY A: Hx of glaucoma. P: Continue alphagan eye drops  CXR w/ some improved aeration. Remains sedated. Need to r/o seizure. Not ready to wean.  RML remains collapsed will perform bronchoscopy today.  Mother updated bedside.  CC time 35 min.  Rush Farmer, M.D. [facility] Pulmonary/Critical Care Medicine. Pager: [phone]. After hours pager: [phone].

## 2013-10-30 ENCOUNTER — Inpatient Hospital Stay (HOSPITAL_COMMUNITY): Payer: PRIVATE HEALTH INSURANCE

## 2013-10-30 DIAGNOSIS — J690 Pneumonitis due to inhalation of food and vomit: Secondary | ICD-10-CM

## 2013-10-30 LAB — COMPREHENSIVE METABOLIC PANEL
ALBUMIN: 2.4 g/dL — AB (ref 3.5–5.2)
ALT: 16 U/L (ref 0–53)
AST: 23 U/L (ref 0–37)
Alkaline Phosphatase: 72 U/L (ref 39–117)
BILIRUBIN TOTAL: 0.2 mg/dL — AB (ref 0.3–1.2)
BUN: 9 mg/dL (ref 6–23)
CALCIUM: 8.8 mg/dL (ref 8.4–10.5)
CHLORIDE: 108 meq/L (ref 96–112)
CO2: 23 mEq/L (ref 19–32)
Creatinine, Ser: 0.78 mg/dL (ref 0.50–1.35)
GFR calc Af Amer: >90 mL/min (ref >90)
Glucose, Bld: 110 mg/dL — ABNORMAL HIGH (ref 70–99)
Potassium: 3.2 mEq/L — ABNORMAL LOW (ref 3.7–5.3)
Sodium: 145 mEq/L (ref 137–147)
Total Protein: 6.5 g/dL (ref 6.0–8.3)

## 2013-10-30 LAB — BLOOD GAS, ARTERIAL
ACID-BASE EXCESS: 0.3 mmol/L (ref 0.0–2.0)
BICARBONATE: 24 meq/L (ref 20.0–24.0)
Drawn by: 24513
FIO2: 0.3 %
LHR: 14 {breaths}/min
MECHVT: 20 mL
O2 Saturation: 96 %
PATIENT TEMPERATURE: 98.7
PEEP/CPAP: 5 cmH2O
PO2 ART: 88.4 mmHg (ref 80.0–100.0)
TCO2: 25.1 mmol/L (ref 0–100)
pCO2 arterial: 36 mmHg (ref 35.0–45.0)
pH, Arterial: 7.44 (ref 7.350–7.450)

## 2013-10-30 LAB — GLUCOSE, CAPILLARY
GLUCOSE-CAPILLARY: 126 mg/dL — AB (ref 70–99)
Glucose-Capillary: 100 mg/dL — ABNORMAL HIGH (ref 70–99)
Glucose-Capillary: 85 mg/dL (ref 70–99)
Glucose-Capillary: 90 mg/dL (ref 70–99)
Glucose-Capillary: 110 mg/dL — ABNORMAL HIGH (ref 70–99)
Glucose-Capillary: 94 mg/dL (ref 70–99)

## 2013-10-30 LAB — STOOL CULTURE

## 2013-10-30 LAB — MAGNESIUM
MAGNESIUM: 2.3 mg/dL (ref 1.5–2.5)
Magnesium: 1.9 mg/dL (ref 1.5–2.5)

## 2013-10-30 LAB — CBC
HEMATOCRIT: 23.7 % — AB (ref 39.0–52.0)
Hemoglobin: 7.9 g/dL — ABNORMAL LOW (ref 13.0–17.0)
MCH: 26.8 pg (ref 26.0–34.0)
MCHC: 33.3 g/dL (ref 30.0–36.0)
MCV: 80.3 fL (ref 78.0–100.0)
PLATELETS: 242 10*3/uL (ref 150–400)
RBC: 2.95 MIL/uL — ABNORMAL LOW (ref 4.22–5.81)
RDW: 16.6 % — AB (ref 11.5–15.5)
WBC: 6.7 10*3/uL (ref 4.0–10.5)

## 2013-10-30 LAB — VANCOMYCIN, TROUGH: Vancomycin Tr: 21.9 ug/mL — ABNORMAL HIGH (ref 10.0–20.0)

## 2013-10-30 LAB — PHOSPHORUS
PHOSPHORUS: 2.7 mg/dL (ref 2.3–4.6)
PHOSPHORUS: 2.4 mg/dL (ref 2.3–4.6)

## 2013-10-30 MED ORDER — VANCOMYCIN HCL IN DEXTROSE 1-5 GM/200ML-% IV SOLN
1000.0000 mg | Freq: Two times a day (BID) | INTRAVENOUS | Status: DC
  Administered 2013-10-30 – 2013-10-31 (×2): 1000 mg via INTRAVENOUS
  Filled 2013-10-30 (×3): qty 200

## 2013-10-30 MED ORDER — MAGNESIUM SULFATE 40 MG/ML IJ SOLN
2.0000 g | Freq: Once | INTRAMUSCULAR | Status: AC
  Administered 2013-10-30: 2 g via INTRAVENOUS
  Filled 2013-10-30 (×2): qty 50

## 2013-10-30 MED ORDER — POTASSIUM CHLORIDE 20 MEQ/15ML (10%) PO LIQD
40.0000 meq | ORAL | Status: AC
  Administered 2013-10-30: 40 meq
  Filled 2013-10-30: qty 30

## 2013-10-30 MED ORDER — PANTOPRAZOLE SODIUM 40 MG IV SOLR
40.0000 mg | Freq: Two times a day (BID) | INTRAVENOUS | Status: DC
Start: 2013-10-30 — End: 2013-10-31
  Administered 2013-10-30 – 2013-10-31 (×2): 40 mg via INTRAVENOUS
  Filled 2013-10-30 (×2): qty 40

## 2013-10-30 MED ORDER — SODIUM CHLORIDE 0.9 % IV SOLN
INTRAVENOUS | Status: DC
  Administered 2013-10-30: 16:00:00 via INTRAVENOUS

## 2013-10-30 NOTE — Progress Notes (Addendum)
PULMONARY / CRITICAL CARE MEDICINE   Name: Peter [name] MRN: [id number] DOB: 1950/10/28    ADMISSION DATE:  10/25/2013 CONSULTATION DATE:  10/28/2013  REFERRING MD :  Sherral Hammers  PRIMARY SERVICE: [facility]   CHIEF COMPLAINT: Shortness of  breath  BRIEF PATIENT DESCRIPTION:  63 yo male smoker, alcoholic admitted 6/01 with melanotic stools, cough, sputum found to have pneumonia with respiratory failure requiring ventilatory support  and delirium tremens due to heavy alcohol abuse.    SIGNIFICANT EVENTS: 4/21 Admitted to [facility]  4/24 DT's, respiratory failure >> failed BiPAP >> to ICU, VDRF. 4/25 Bronchoscopy revealed outake of the RML was very small and tight, completely obstructed with thick secretions that were all removed.   STUDIES:  EEG 4/25>>>  LINES / TUBES: ETT 4/24 >> CVL 4/24 >>   CULTURES: MRSA 4/21 NEG Sputum 4/21 NEG Strep Pneum/Legionella  4/21 NEG Stool culture 4/22>>> Resp viral panel 4/23>>>Metapneumovirus  Ova and parasite 4/22 NEG Sputum culture 4/21: MODERATE HAEMOPHILUS INFLUENZAE (BETA LACTAMASE POSITIVE) BCX2 4/22>>> BCX2 4/21>>> Cdiff 4/21 NEG  GI pathogen 4/23 NEG Urine 4/21 >100K multiple bacterial morphotypes  Bronchial Gram Stain 4/25 >>> Bronchial AFB 4/25 >>> Bronchial Fungal 4/25 >>> Bronchial Culture 4/25 >>>  ANTIBIOTICS: Cefepime 4/21>>>4/22 Zosyn 4/22>>> Vanc  4/22>>>   SUBJECTIVE:   No acute events overnight. Pt sedated on ventilator. Tremors improved. Denies pain.   VITAL SIGNS: Temp:  [98.4 F (36.9 C)-98.9 F (37.2 C)] 98.4 F (36.9 C) (04/26 0418) Pulse Rate:  [32-111] 94 (04/26 0310) Resp:  [14-30] 19 (04/26 0400) BP: (93-149)/(51-92) 149/92 mmHg (04/26 0400) SpO2:  [91 %-100 %] 100 % (04/26 0310) FiO2 (%):  [30 %-40 %] 30 % (04/26 0310) Weight:  [72.7 kg (160 lb 4.4 oz)] 72.7 kg (160 lb 4.4 oz) (04/26 0500) HEMODYNAMICS: CVP:  [6 mmHg-10 mmHg] 10 mmHg VENTILATOR SETTINGS: Vent Mode:  [-] PRVC FiO2 (%):  [30 %-40 %]  30 % Set Rate:  [14 bmp] 14 bmp Vt Set:  [620 mL] 620 mL PEEP:  [5 cmH20] 5 cmH20 Plateau Pressure:  [18 cmH20-22 cmH20] 22 cmH20 INTAKE / OUTPUT:  Intake/Output Summary (Last 24 hours) at 10/30/13 0616 Last data filed at 10/30/13 0500  Gross per 24 hour  Intake 2658.9 ml  Output   2250 ml  Net  408.9 ml    PHYSICAL EXAMINATION: General: arousable from sedation  Neuro: follows commands  HEENT:  Edentulous Cardiovascular: Regular rhythm, tachycardic  Lungs: Ronchi in anterior fields Abdomen: Tight, no abdominal tenderness, hyperactive bowel sounds  Musculoskeletal: SCD's Skin: warm, dry   LABS:  CBC  Recent Labs Lab 10/28/13 1700 10/29/13 0445 10/30/13 0440  WBC 9.8 7.8 6.7  HGB 7.3* 7.4* 7.9*  HCT 21.5* 21.9* 23.7*  PLT 180 188 242   Coag's  Recent Labs Lab 10/27/13 0328  INR 1.17   BMET  Recent Labs Lab 10/28/13 1700 10/29/13 0445 10/30/13 0440  NA 142 142 145  K 3.0* 3.4* 3.2*  CL 106 107 108  CO2 22 22 23   BUN 12 11 9   CREATININE 1.05 0.91 0.78  GLUCOSE 114* 105* 110*   Electrolytes  Recent Labs Lab 10/28/13 1700 10/29/13 0445 10/30/13 0440  CALCIUM 8.3* 8.4 8.8  MG 1.3* 1.4* 1.9  PHOS  --   --  2.7   Sepsis Markers  Recent Labs Lab 10/25/13 1942 10/25/13 2004 10/28/13 0331  LATICACIDVEN 3.50* 2.0 1.6   ABG  Recent Labs Lab 10/28/13 0524 10/28/13 1654 10/30/13 0310  PHART 7.356 7.387 7.440  PCO2ART 36.8 41.7 36.0  PO2ART 57.9* 391.0* 88.4   Liver Enzymes  Recent Labs Lab 10/28/13 0331 10/29/13 0445 10/30/13 0440  AST 37 29 23  ALT 22 17 16   ALKPHOS 78 66 72  BILITOT 0.3 0.4 0.2*  ALBUMIN 2.6* 2.4* 2.4*   Cardiac Enzymes  Recent Labs Lab 10/25/13 2155 10/26/13 0340 10/26/13 0925  TROPONINI <0.30 <0.30 <0.30   Glucose  Recent Labs Lab 10/29/13 0734 10/29/13 1056 10/29/13 1510 10/29/13 1915 10/29/13 2355 10/30/13 0347  GLUCAP 94 114* 92 102* 126* 100*    Imaging Dg Chest Port 1  View  10/30/2013   CLINICAL DATA:  Endotracheal tube placement.  EXAM: PORTABLE CHEST - 1 VIEW  COMPARISON:  Chest radiograph performed 10/29/2013  FINDINGS: The patient endotracheal tube is seen ending 4-5 cm above the carina. An enteric tube is noted extending below the diaphragm. A right IJ line is noted ending about the distal SVC.  There has been mild interval redistribution of right basilar airspace opacification, which may reflect atelectasis or pneumonia. Patchy left basilar airspace opacity has mildly worsened. No definite pleural effusion or pneumothorax is seen.  The cardiomediastinal silhouette remains normal in size. No acute osseous abnormalities are identified. Deformity is again noted at the distal left clavicle.  IMPRESSION: 1. Endotracheal tube seen ending 4-5 cm above the carina. 2. Mild interval redistribution of right basilar airspace opacification, which may reflect atelectasis or pneumonia. Mild interval worsening of patchy left basilar airspace opacity. Underlying mild interstitial edema cannot be excluded.   Electronically Signed   By: Garald Balding M.D.   On: 10/30/2013 05:28   Dg Chest Port 1 View  10/29/2013   CLINICAL DATA:  Followup pneumonia  EXAM: PORTABLE CHEST - 1 VIEW  COMPARISON:  Prior chest x-ray 10/28/2013  FINDINGS: The endotracheal tube is 4.1 cm above the carinal. A nasogastric tube has been placed. The tip overlies the gastric fundus. Stable position of right IJ approach central venous catheter. Catheter tip at the superior cavoatrial junction. Progressive right middle lobe atelectasis. Similar appearance of right upper lobe and bilateral basilar patchy airspace opacities. Mild vascular congestion without overt edema. Small right pleural effusion. No pneumothorax. No acute osseous abnormality.  IMPRESSION: 1. Progressed right middle lobe atelectasis, possibly secondary to mucous plugging. 2. Similar appearance of patchy airspace opacity in the right upper lobe and  bilateral bases. 3. The tip of the newly placed nasogastric tube projects over the gastric fundus. Other support apparatus in unchanged position. 4. Small right pleural effusion.   Electronically Signed   By: Jacqulynn Cadet M.D.   On: 10/29/2013 07:37   Dg Chest Port 1 View  10/28/2013   CLINICAL DATA:  Line placement.  EXAM: PORTABLE CHEST - 1 VIEW  COMPARISON:  Single view of the chest 10/28/2013 at 8:00 a.m.  FINDINGS: The patient has a new endotracheal tube in place with tip in good position at the level of the clavicular heads. Right IJ approach Swan-Ganz catheter tip projects in the lower superior vena cava. There is no pneumothorax. Bilateral airspace disease, worse on the right, is not markedly changed.  IMPRESSION: ET tube and NG tube project in good position. No pneumothorax or other change.   Electronically Signed   By: Inge Rise M.D.   On: 10/28/2013 16:43   Dg Chest Port 1 View  10/28/2013   CLINICAL DATA:  Worsening hypoxia. Pneumonia. Alcohol abuse. Smoker.  EXAM: PORTABLE CHEST - 1 VIEW  COMPARISON:  10/25/2013  FINDINGS: Worsening diffuse bilateral airspace disease is seen, suspicious for worsening pulmonary edema. There is persistent right perihilar consolidation which may represent superimposed pneumonia. Heart size is stable and within normal limits.  IMPRESSION: Worsening diffuse bilateral airspace disease, consistent with increased pulmonary edema.  Persistent right perihilar consolidation ; superimposed pneumonia cannot be excluded.   Electronically Signed   By: Earle Gell M.D.   On: 10/28/2013 08:27   Dg Abd Portable 1v  10/28/2013   CLINICAL DATA:  OG tube placement  EXAM: PORTABLE ABDOMEN - 1 VIEW  COMPARISON:  None.  FINDINGS: Enteric tube terminates in the left upper abdomen, incompletely visualized, but likely within the gastric cardia.  Nonspecific bowel gas pattern, with mildly prominent loops of small and large bowel. The appearance favors adynamic ileus over small  bowel obstruction.  Degenerative changes of the lumbar spine  IMPRESSION: Enteric tube terminates in the left upper abdomen, incompletely visualized, but likely within the gastric cardia.  Possible adynamic ileus.   Electronically Signed   By: Julian Hy M.D.   On: 10/28/2013 17:47     ASSESSMENT / PLAN:  PULMONARY A:  Acute respiratory failure in setting of  PNA and RML mucous plugging/collapse   - s/p bronchoscopy on 4/25 with removal of mucous plugging   - ABG wnl today   Metapneumovirus and Haemophilus Influenza Positive  History of aspiration PNA History of COPD  Tobacco abuse   P:   Cont full vent support, wean as tolerated Cont abx (see ID section). Cont DuoNeb Q 6 hr F/U bronchial washing studies   CARDIOVASCULAR A:  History of Cardiomyopathy (EF 45 to 50% from 01/14/13) History of Hypertension   -155/88 today History of Hyperlipidemia  P:  CVP monitoring with goal 10-12 Continue IV fluids to keep even vol status   RENAL A:   Mild hyponatremia - resolved Hypokalemia  Hypomagnesmia  P:   Continue D5 1/2 NS with KCl @ 50 mL/hr Replace K & Mg  Keep even volume status   GASTROINTESTINAL A:   Melena  - most likely due to gastritis in setting of heavy ethanol use (last EGD 05/19/13) History of alcoholic cirrhosis Protein calorie malnutrition  P:   Protonix  BID Continue TF's Folic acid and thiamine daily  Consider EGD if bleeding continues  HEMATOLOGIC A:  Microcytic/Normocytic Anemia  DVT Ppx P:  PPI BID  Transfuse if Hg <7 Consider anemia panel (last 12/2011) SCD for DVT prevention  INFECTIOUS A:   Haemophilus influenza PNA (beta lactamase positive) and Metapneumovirus  Possible aspiration PNA   -in setting of heavy alcohol use  Positive UA with cultures with (mult orgs >100K) Severe sepsis History of Acinetobacter UTI (Jan 2015) P:   F/u cultures Continue vanc and zosyn   ENDOCRINE A:   Hyperglycemia TSH normal (1.775) on  08/02/13 P:   Sensitive SSI Monitor CBG Q 4hr   NEUROLOGIC A:   Acute encephalopathy  - in setting of heavy ethanol use and sepsis  Delirium tremens Sedation on Ventilator  Heavy alcohol use (Ethanol 40 on admission)  History of Stroke P:   Propofol infusion  Versed PRN agitation Thiamine, folic acid Awaiting EEG    OPTHALMOLOGY A: History of glaucoma P: Continue alphagan eye drops  Juluis Mire, MD PGY-1 IMTS  Will extubate today, OOB to chair, titrate O2, monitor for DTs, PT/OT and will hold in ICU overnight.  CC time 35 min.  Patient seen and examined, agree with above  note.  I dictated the care and orders written for this patient under my direction.  Rush Farmer, MD [phone]

## 2013-10-30 NOTE — Progress Notes (Signed)
ANTIBIOTIC CONSULT NOTE - FOLLOW UP  Pharmacy Consult for Vancomycin + Zosyn Indication: H influenzae PNA and r/o sepsis and new PNA  Allergies  Allergen Reactions  . Poison Ivy Extract [Extract Of Poison Ivy] Rash    Patient Measurements: Height: 5' 7.5" (171.5 cm) Weight: 160 lb 4.4 oz (72.7 kg) IBW/kg (Calculated) : 67.25  Vital Signs: Temp: 98.9 F (37.2 C) (04/26 1602) Temp src: Oral (04/26 1602) BP: 129/70 mmHg (04/26 1600) Pulse Rate: 110 (04/26 1600) Intake/Output from previous day: 04/25 0701 - 04/26 0700 In: 2630.7 [I.V.:1723.2; NG/GT:407.5; IV Piggyback:500] Out: 2510 [Urine:2020; Emesis/NG output:490] Intake/Output from this shift: Total I/O In: 747.5 [I.V.:385; NG/GT:175; IV Piggyback:187.5] Out: -   Labs:  Recent Labs  10/28/13 1700 10/29/13 0445 10/30/13 0440  WBC 9.8 7.8 6.7  HGB 7.3* 7.4* 7.9*  PLT 180 188 242  CREATININE 1.05 0.91 0.78   Estimated Creatinine Clearance: 93.5 ml/min (by C-G formula based on Cr of 0.78).  Recent Labs  10/30/13 1530  [place] 21.9*     Microbiology: Recent Results (from the past 720 hour(s))  CULTURE, BLOOD (ROUTINE X 2)     Status: None   Collection Time    10/25/13  6:30 PM      Result Value Ref Range Status   Specimen Description BLOOD RIGHT FOREARM   Final   Special Requests     Final   Value: BOTTLES DRAWN AEROBIC AND ANAEROBIC 10CCBLUE 8CCRED   Culture  Setup Time     Final   Value: 10/26/2013 00:21     Performed at [facility]   Culture     Final   Value:        BLOOD CULTURE RECEIVED NO GROWTH TO DATE CULTURE WILL BE HELD FOR 5 DAYS BEFORE ISSUING A FINAL NEGATIVE REPORT     Performed at [facility]   Report Status PENDING   Incomplete  CULTURE, BLOOD (ROUTINE X 2)     Status: None   Collection Time    10/25/13  7:24 PM      Result Value Ref Range Status   Specimen Description BLOOD RIGHT HAND   Final   Special Requests BOTTLES DRAWN AEROBIC ONLY 5 CC   Final   Culture   Setup Time     Final   Value: 10/26/2013 03:58     Performed at [facility]   Culture     Final   Value:        BLOOD CULTURE RECEIVED NO GROWTH TO DATE CULTURE WILL BE HELD FOR 5 DAYS BEFORE ISSUING A FINAL NEGATIVE REPORT     Performed at [facility]   Report Status PENDING   Incomplete  URINE CULTURE     Status: None   Collection Time    10/25/13  7:39 PM      Result Value Ref Range Status   Specimen Description URINE, CLEAN CATCH   Final   Special Requests NONE   Final   Culture  Setup Time     Final   Value: 10/26/2013 00:27     Performed at [facility]     Final   Value: >=100,000 COLONIES/ML     Performed at [facility]   Culture     Final   Value: Multiple bacterial morphotypes present, none predominant. Suggest appropriate recollection if clinically indicated.     Performed at [facility]   Report Status 10/27/2013 FINAL  Final  CULTURE, EXPECTORATED SPUTUM-ASSESSMENT     Status: None   Collection Time    10/25/13  8:00 PM      Result Value Ref Range Status   Specimen Description SPUTUM   Final   Special Requests Normal   Final   Sputum evaluation     Final   Value: THIS SPECIMEN IS ACCEPTABLE. RESPIRATORY CULTURE REPORT TO FOLLOW.   Report Status 10/25/2013 FINAL   Final  CULTURE, RESPIRATORY (NON-EXPECTORATED)     Status: None   Collection Time    10/25/13  8:00 PM      Result Value Ref Range Status   Specimen Description SPUTUM   Final   Special Requests NONE   Final   Gram Stain     Final   Value: ABUNDANT WBC PRESENT,BOTH PMN AND MONONUCLEAR     NO SQUAMOUS EPITHELIAL CELLS SEEN     ABUNDANT GRAM NEGATIVE RODS     FEW GRAM POSITIVE COCCI IN PAIRS     Performed at [facility]   Culture     Final   Value: MODERATE HAEMOPHILUS INFLUENZAE     Note: BETA LACTAMASE POSITIVE     Performed at [facility]   Report Status 10/27/2013 FINAL   Final  MRSA PCR SCREENING     Status: None    Collection Time    10/25/13 11:44 PM      Result Value Ref Range Status   MRSA by PCR NEGATIVE  NEGATIVE Final   Comment:            The GeneXpert MRSA Assay (FDA     approved for NASAL specimens     only), is one component of a     comprehensive MRSA colonization     surveillance program. It is not     intended to diagnose MRSA     infection nor to guide or     monitor treatment for     MRSA infections.  CLOSTRIDIUM DIFFICILE BY PCR     Status: None   Collection Time    10/26/13 12:46 AM      Result Value Ref Range Status   C difficile by pcr NEGATIVE  NEGATIVE Final  STOOL CULTURE     Status: None   Collection Time    10/26/13 12:47 AM      Result Value Ref Range Status   Specimen Description STOOL   Final   Special Requests NONE   Final   Culture     Final   Value: NO SALMONELLA, SHIGELLA, CAMPYLOBACTER, YERSINIA, OR E.COLI 0157:H7 ISOLATED     Performed at [facility]   Report Status 10/30/2013 FINAL   Final  OVA AND PARASITE EXAMINATION     Status: None   Collection Time    10/26/13 12:47 AM      Result Value Ref Range Status   Specimen Description STOOL   Final   Special Requests NONE   Final   Ova and parasites     Final   Value: NO OVA OR PARASITES SEEN     Performed at [facility]   Report Status 10/27/2013 FINAL   Final  CULTURE, BLOOD (ROUTINE X 2)     Status: None   Collection Time    10/26/13 12:50 PM      Result Value Ref Range Status   Specimen Description BLOOD RIGHT ARM   Final   Special Requests BOTTLES DRAWN AEROBIC ONLY [place]  Final   Culture  Setup Time     Final   Value: 10/26/2013 16:29     Performed at [facility]   Culture     Final   Value:        BLOOD CULTURE RECEIVED NO GROWTH TO DATE CULTURE WILL BE HELD FOR 5 DAYS BEFORE ISSUING A FINAL NEGATIVE REPORT     Performed at [facility]   Report Status PENDING   Incomplete  CULTURE, BLOOD (ROUTINE X 2)     Status: None   Collection Time    10/26/13  1:00  PM      Result Value Ref Range Status   Specimen Description BLOOD RIGHT HAND   Final   Special Requests BOTTLES DRAWN AEROBIC ONLY 6CC   Final   Culture  Setup Time     Final   Value: 10/26/2013 16:29     Performed at [facility]   Culture     Final   Value:        BLOOD CULTURE RECEIVED NO GROWTH TO DATE CULTURE WILL BE HELD FOR 5 DAYS BEFORE ISSUING A FINAL NEGATIVE REPORT     Performed at [facility]   Report Status PENDING   Incomplete  RESPIRATORY VIRUS PANEL     Status: Abnormal   Collection Time    10/27/13  7:52 PM      Result Value Ref Range Status   Source - RVPAN NASOPHARYNGEAL   Final   Respiratory Syncytial Virus A NOT DETECTED   Final   Respiratory Syncytial Virus B NOT DETECTED   Final   Influenza A NOT DETECTED   Final   Influenza B NOT DETECTED   Final   Parainfluenza 1 NOT DETECTED   Final   Parainfluenza 2 NOT DETECTED   Final   Parainfluenza 3 NOT DETECTED   Final   Metapneumovirus DETECTED (*)  Final   Rhinovirus NOT DETECTED   Final   Adenovirus NOT DETECTED   Final   Influenza A H1 NOT DETECTED   Final   Influenza A H3 NOT DETECTED   Final   Comment: (NOTE)           Normal Reference Range for each Analyte: NOT DETECTED     Testing performed using the Luminex xTAG Respiratory Viral Panel test     kit.     This test was developed and its performance characteristics determined     by [facility]. It has not been cleared or approved by the [place]     Food and Drug Administration. This test is used for clinical purposes.     It should not be regarded as investigational or for research. This     laboratory is certified under the [place] (CLIA) as qualified to perform high complexity     clinical laboratory testing.     Performed at [place], RESPIRATORY (NON-EXPECTORATED)     Status: None   Collection Time    10/28/13  4:49 PM      Result Value Ref Range Status    Specimen Description ENDOTRACHEAL   Final   Special Requests NONE   Final   Gram Stain     Final   Value: FEW WBC PRESENT,BOTH PMN AND MONONUCLEAR     NO SQUAMOUS EPITHELIAL CELLS SEEN     NO ORGANISMS SEEN     Performed at [facility]   Culture     Final   Value: Non-Pathogenic Oropharyngeal-type Flora Isolated.     Performed at [facility]   Report Status PENDING   Incomplete  AFB CULTURE WITH SMEAR     Status: None   Collection Time    10/29/13 11:06 AM      Result Value Ref Range Status   Specimen Description BRONCHIAL WASHINGS   Final   Special Requests NONE   Final   Acid Fast Smear     Final   Value: NO ACID FAST BACILLI SEEN     Performed at [facility]   Culture     Final   Value: CULTURE WILL BE EXAMINED FOR 6 WEEKS BEFORE ISSUING A FINAL REPORT     Performed at [facility]   Report Status PENDING   Incomplete  CULTURE, BAL-QUANTITATIVE     Status: None   Collection Time    10/29/13 11:06 AM      Result Value Ref Range Status   Specimen Description BRONCHIAL ALVEOLAR LAVAGE   Final   Special Requests NONE   Final   Gram Stain PENDING   Incomplete   Colony Count PENDING   Incomplete   Culture     Final   Value: NO GROWTH     Performed at [facility]   Report Status PENDING   Incomplete  FUNGUS CULTURE W SMEAR     Status: None   Collection Time    10/29/13 11:06 AM      Result Value Ref Range Status   Specimen Description BRONCHIAL WASHINGS   Final   Special Requests NONE   Final   Fungal Smear     Final   Value: NO YEAST OR FUNGAL ELEMENTS SEEN     Performed at [facility]   Culture     Final   Value: CULTURE IN PROGRESS FOR FOUR WEEKS     Performed at [facility]   Report Status PENDING   Incomplete    Anti-infectives   Start     Dose/Rate Route Frequency Ordered Stop   10/26/13 2000  ceFEPIme (MAXIPIME) 1 g in dextrose 5 % 50 mL IVPB  Status:  Discontinued     1 g 100 mL/hr over 30 Minutes  Intravenous Every 12 hours 10/25/13 2105 10/25/13 2137   10/26/13 1000  piperacillin-tazobactam (ZOSYN) IVPB 3.375 g     3.375 g 12.5 mL/hr over 240 Minutes Intravenous Every 8 hours 10/26/13 0810     10/26/13 0600  ceFEPIme (MAXIPIME) 1 g in dextrose 5 % 50 mL IVPB  Status:  Discontinued     1 g 100 mL/hr over 30 Minutes Intravenous Every 12 hours 10/25/13 2137 10/26/13 0756   10/26/13 0400  vancomycin (VANCOCIN) IVPB 750 mg/150 ml premix     750 mg 150 mL/hr over 60 Minutes Intravenous Every 8 hours 10/25/13 2105     10/25/13 1915  ceFEPIme (MAXIPIME) 2 g in dextrose 5 % 50 mL IVPB     2 g 100 mL/hr over 30 Minutes Intravenous  Once 10/25/13 1901 10/25/13 1958   10/25/13 1915  vancomycin (VANCOCIN) IVPB 1000 mg/200 mL premix     1,000 mg 200 mL/hr over 60 Minutes Intravenous  Once 10/25/13 1901 10/25/13 2051      Assessment: 63 y.o. M who continues on Vancomycin + Zosyn for H influenzae PNA and r/o sepsis. The patient was noted to have worsening respiratory  status on 4/24 requiring intubation and transfer to the ICU. New cultures have been drawn and are pending. A Vancomycin trough this afternoon is slightly SUPRAtherapeutic (VT 21.9 mcg/ml, goal of 15-20 mcg/ml) - will reduce dose to target goal range. Tmax/24h: 99.1, WBC wnl, SCr 0.78, CrCl~80 ml/min.   If the new cultures do not grow GPC, Vancomycin can likely be discontinued since Zosyn will cover H influenzae PNA. Will follow-up culture results.  Goal of Therapy:  Vancomycin trough level 15-20 mcg/ml Proper antibiotics for infection/cultures adjusted for renal/hepatic function   Plan:  1. Reduce Vancomycin to 1g IV every 12 hours (next dose due at 2000) 2. Continue Zosyn 3.375g IV every 8 hours (infused over 4 hours) 3. Will continue to follow renal function, culture results, LOT, and antibiotic de-escalation plans   Alycia Rossetti, PharmD, BCPS Clinical Pharmacist Pager: [phone] 10/30/2013 5:01 PM

## 2013-10-30 NOTE — Progress Notes (Signed)
[place] Progress Note Patient Name: Peter [name] DOB: Sep 27, 1950 MRN: [id number]  Date of Service  10/30/2013   HPI/Events of Note   Hypokalemia  eICU Interventions  Potassium replaced   Intervention Category Intermediate Interventions: Electrolyte abnormality - evaluation and management  Guadelupe Sabin Kayleah Appleyard 10/30/2013, 6:00 AM

## 2013-10-30 NOTE — Procedures (Signed)
Extubation Procedure Note  Patient Details:   Name: Peter [name] DOB: Dec 20, 1950 MRN: [id number]   Airway Documentation:     Evaluation  O2 sats: stable throughout Complications: No apparent complications Patient did tolerate procedure well. Bilateral Breath Sounds: Clear;Diminished Suctioning: Airway;Oral Yes.  Pt extubated per Dr.'s orders.  Placed on 4L sats.  100% pt doing well  Dione Housekeeper Paislyn Domenico 10/30/2013, 1:01 PM

## 2013-10-31 ENCOUNTER — Inpatient Hospital Stay (HOSPITAL_COMMUNITY): Payer: PRIVATE HEALTH INSURANCE

## 2013-10-31 DIAGNOSIS — R5381 Other malaise: Secondary | ICD-10-CM

## 2013-10-31 DIAGNOSIS — J159 Unspecified bacterial pneumonia: Secondary | ICD-10-CM

## 2013-10-31 LAB — CBC
HEMATOCRIT: 22.3 % — AB (ref 39.0–52.0)
HEMOGLOBIN: 7.4 g/dL — AB (ref 13.0–17.0)
MCH: 26.9 pg (ref 26.0–34.0)
MCHC: 33.2 g/dL (ref 30.0–36.0)
MCV: 81.1 fL (ref 78.0–100.0)
Platelets: 274 10*3/uL (ref 150–400)
RBC: 2.75 MIL/uL — ABNORMAL LOW (ref 4.22–5.81)
RDW: 16.6 % — ABNORMAL HIGH (ref 11.5–15.5)
WBC: 7.8 10*3/uL (ref 4.0–10.5)

## 2013-10-31 LAB — RETICULOCYTES
RBC.: 2.75 MIL/uL — ABNORMAL LOW (ref 4.22–5.81)
Retic Count, Absolute: 46.8 10*3/uL (ref 19.0–186.0)
Retic Ct Pct: 1.7 % (ref 0.4–3.1)

## 2013-10-31 LAB — FERRITIN: Ferritin: 349 ng/mL — ABNORMAL HIGH (ref 22–322)

## 2013-10-31 LAB — CULTURE, RESPIRATORY

## 2013-10-31 LAB — BASIC METABOLIC PANEL
BUN: 6 mg/dL (ref 6–23)
CHLORIDE: 106 meq/L (ref 96–112)
CO2: 24 mEq/L (ref 19–32)
Calcium: 9.1 mg/dL (ref 8.4–10.5)
Creatinine, Ser: 0.84 mg/dL (ref 0.50–1.35)
GFR calc Af Amer: >90 mL/min (ref >90)
Glucose, Bld: 90 mg/dL (ref 70–99)
POTASSIUM: 3 meq/L — AB (ref 3.7–5.3)
Sodium: 143 mEq/L (ref 137–147)

## 2013-10-31 LAB — IRON AND TIBC: UIBC: 164 ug/dL (ref 125–400)

## 2013-10-31 LAB — CULTURE, RESPIRATORY W GRAM STAIN

## 2013-10-31 LAB — GLUCOSE, CAPILLARY
GLUCOSE-CAPILLARY: 83 mg/dL (ref 70–99)
GLUCOSE-CAPILLARY: 120 mg/dL — AB (ref 70–99)
GLUCOSE-CAPILLARY: 107 mg/dL — AB (ref 70–99)
Glucose-Capillary: 111 mg/dL — ABNORMAL HIGH (ref 70–99)
Glucose-Capillary: 89 mg/dL (ref 70–99)
Glucose-Capillary: 90 mg/dL (ref 70–99)
Glucose-Capillary: 95 mg/dL (ref 70–99)

## 2013-10-31 LAB — MAGNESIUM: Magnesium: 1.9 mg/dL (ref 1.5–2.5)

## 2013-10-31 LAB — PHOSPHORUS: PHOSPHORUS: 3.7 mg/dL (ref 2.3–4.6)

## 2013-10-31 LAB — FOLATE: Folate: >20 ng/mL

## 2013-10-31 LAB — VITAMIN B12: Vitamin B-12: 616 pg/mL (ref 211–911)

## 2013-10-31 MED ORDER — VITAMIN B-1 100 MG PO TABS
100.0000 mg | ORAL_TABLET | Freq: Every day | ORAL | Status: DC
  Administered 2013-11-01 – 2013-11-04 (×4): 100 mg via ORAL
  Filled 2013-10-31 (×4): qty 1

## 2013-10-31 MED ORDER — FOLIC ACID 1 MG PO TABS
1.0000 mg | ORAL_TABLET | Freq: Every day | ORAL | Status: DC
  Administered 2013-11-01 – 2013-11-04 (×4): 1 mg via ORAL
  Filled 2013-10-31 (×4): qty 1

## 2013-10-31 MED ORDER — POTASSIUM CHLORIDE 10 MEQ/50ML IV SOLN
10.0000 meq | INTRAVENOUS | Status: AC
  Administered 2013-10-31 (×4): 10 meq via INTRAVENOUS
  Filled 2013-10-31 (×4): qty 50

## 2013-10-31 MED ORDER — INSULIN ASPART 100 UNIT/ML ~~LOC~~ SOLN: 0.0000 [IU] | Freq: Three times a day (TID) | SUBCUTANEOUS | Status: DC

## 2013-10-31 MED ORDER — POTASSIUM CHLORIDE CRYS ER 20 MEQ PO TBCR
40.0000 meq | EXTENDED_RELEASE_TABLET | Freq: Once | ORAL | Status: AC
  Administered 2013-10-31: 40 meq via ORAL
  Filled 2013-10-31: qty 2

## 2013-10-31 MED ORDER — MAGNESIUM SULFATE 40 MG/ML IJ SOLN
2.0000 g | Freq: Once | INTRAMUSCULAR | Status: AC
  Administered 2013-10-31: 2 g via INTRAVENOUS
  Filled 2013-10-31: qty 50

## 2013-10-31 MED ORDER — PANTOPRAZOLE SODIUM 40 MG PO TBEC
40.0000 mg | DELAYED_RELEASE_TABLET | Freq: Two times a day (BID) | ORAL | Status: DC
  Administered 2013-10-31 – 2013-11-04 (×8): 40 mg via ORAL
  Filled 2013-10-31 (×7): qty 1

## 2013-10-31 NOTE — Progress Notes (Signed)
NUTRITION FOLLOW UP  Intervention:   Continue Dys 3 diet with thin liquids RD to continue to follow nutrition plan   Nutrition Dx:   Inadequate oral intake related to inability to eat as evidenced by npo status; resolved.   No new nutrition diagnosis at this time.    Goal:   Intake to meet > 90% of estimated nutrition needs   Monitor:   PO intake, weight trends, labs   Assessment:   PMHx significant for heavy ETOH abuse, COPD, liver cirrhosis. Admitted with HCAP, sepsis and melanotic stools. Developed respiratory distress and DT's on 4/24 and was transferred to ICU and intubated.  Patient discussed during ICU rounds. Pt extubated 4/26.   Pt reports good appetite and tolerating Dys 3 diet well. Pt is eating 100% of meal. Pt has no GI issues to report after consuming breakfast this morning.   Low potassium at 3.0 --> ordered for IV KCl   Height: Ht Readings from Last 1 Encounters:  10/25/13 5' 7.5" (1.715 m)    Weight Status:   Wt Readings from Last 1 Encounters:  10/31/13 157 lb 13.6 oz (71.6 kg)    Re-estimated needs:  Kcal: 1900 - 2100 Protein: 100 - 115 g Fluid: 2 L/ day  Skin: no wounds   Diet Order: Dysphagia 3 with thin liquids   Intake/Output Summary (Last 24 hours) at 10/31/13 1102 Last data filed at 10/31/13 1000  Gross per 24 hour  Intake 1723.87 ml  Output   1900 ml  Net -176.13 ml    Last BM: 4/26    Labs:   Recent Labs Lab 10/29/13 0445 10/30/13 0440 10/30/13 1700 10/31/13 0400  NA 142 145  --  143  K 3.4* 3.2*  --  3.0*  CL 107 108  --  106  CO2 22 23  --  24  BUN 11 9  --  6  CREATININE 0.91 0.78  --  0.84  CALCIUM 8.4 8.8  --  9.1  MG 1.4* 1.9 2.3 1.9  PHOS  --  2.7 2.4 3.7  GLUCOSE 105* 110*  --  90    CBG (last 3)   Recent Labs  10/30/13 2343 10/31/13 0335 10/31/13 0729  GLUCAP 95 89 83    Scheduled Meds: . brimonidine  1 drop Both Eyes BID  . folic acid  1 mg Intravenous Daily  . insulin aspart  0-9 Units  Subcutaneous 6 times per day  . ipratropium-albuterol  3 mL Nebulization Q6H  . pantoprazole (PROTONIX) IV  40 mg Intravenous Q12H  . piperacillin-tazobactam (ZOSYN)  IV  3.375 g Intravenous Q8H  . potassium chloride SA  40 mEq Oral Once  . thiamine IV  100 mg Intravenous Daily    Continuous Infusions:   Carrolyn Leigh, BS Dietetic Intern Pager: [phone]  I agree with student dietitian note; appropriate revisions have been made.  Molli Barrows, RD, LDN, [place] Pager# [phone] After Hours Pager# [phone]

## 2013-10-31 NOTE — Progress Notes (Signed)
Report given to [facility] RN- Andie, Patient eye drops and 1 dose of Cardizem left by previous RN from night shift due to contact precaution. Relayed to RN to discard per discretion.

## 2013-10-31 NOTE — Evaluation (Signed)
Physical Therapy Evaluation Patient Details Name: Peter [name] MRN: [id number] DOB: Jan 06, 1951 Today's Date: 10/31/2013   History of Present Illness  Peter [name] is a 63 y.o. male admitted and found to have PNA sepsis with intubation. Pt with history of chronic alcoholism, cirrhosis of liver, ongoing tobacco abuse, COPD, hyperlipidemia, hypertension was brought to the ER after patient was found to be increasingly weak by patient's family. Pt intubated and extubated due to respiratory failure. 4/25 Bronchoscopy  Clinical Impression  Pt admitted with above. Pt currently with functional limitations due to the deficits listed below (see PT Problem List).  Pt will benefit from skilled PT to increase their independence and safety with mobility to allow discharge to the venue listed below.       Follow Up Recommendations CIR    Equipment Recommendations  None recommended by PT    Recommendations for Other Services       Precautions / Restrictions Precautions Precautions: Fall      Mobility  Bed Mobility                  Transfers Overall transfer level: Needs assistance Equipment used: Rolling walker (2 wheeled) Transfers: Sit to/from Stand Sit to Stand: Min assist         General transfer comment: Verbal cues for hand placement and assist to bring hips up.  Ambulation/Gait Ambulation/Gait assistance: Min assist Ambulation Distance (Feet): 50 Feet Assistive device: Rolling walker (2 wheeled) Gait Pattern/deviations: Step-through pattern;Decreased step length - left;Decreased stance time - right;Trunk flexed Gait velocity: decr Gait velocity interpretation: Below normal speed for age/gender General Gait Details: Verbal cues to stand more erect.  Stairs            Wheelchair Mobility    Modified Rankin (Stroke Patients Only)       Balance Overall balance assessment: Needs assistance         Standing balance support: Bilateral upper extremity  supported Standing balance-Leahy Scale: Poor Standing balance comment: walker for UE support                             Pertinent Vitals/Pain Dyspnea 2/4 with amb.    Home Living Family/patient expects to be discharged to:: Private residence Living Arrangements: Other relatives (brother and friend) Available Help at Discharge: Available 24 hours/day;Family Type of Home: House Home Access: Stairs to enter Entrance Stairs-Rails: None Entrance Stairs-Number of Steps: 4 Home Layout: One level Home Equipment: Environmental consultant - 2 wheels;Cane - single point;Tub bench Additional Comments: normally wears dentures but currently does not have at [facility]    Prior Function Level of Independence: Independent with assistive device(s)         Comments: uses cane mostly     Hand Dominance   Dominant Hand: Left    Extremity/Trunk Assessment   Upper Extremity Assessment: Defer to OT evaluation           Lower Extremity Assessment: Generalized weakness         Communication   Communication: No difficulties  Cognition Arousal/Alertness: Awake/alert Behavior During Therapy: WFL for tasks assessed/performed Overall Cognitive Status: Within Functional Limits for tasks assessed                      General Comments      Exercises        Assessment/Plan    PT Assessment Patient needs continued PT services  PT Diagnosis Difficulty  walking;Generalized weakness   PT Problem List Decreased strength;Decreased activity tolerance;Decreased balance;Decreased mobility;Decreased knowledge of use of DME  PT Treatment Interventions DME instruction;Gait training;Functional mobility training;Therapeutic activities;Therapeutic exercise;Balance training;Patient/family education;Stair training   PT Goals (Current goals can be found in the Care Plan section) Acute Rehab PT Goals Patient Stated Goal: Return home PT Goal Formulation: With patient Time For Goal Achievement:  11/07/13 Potential to Achieve Goals: Good    Frequency Min 3X/week   Barriers to discharge        Co-evaluation               End of Session Equipment Utilized During Treatment: Gait belt Activity Tolerance: Patient tolerated treatment well Patient left: in chair;with call bell/phone within reach;with nursing/sitter in room Nurse Communication: Mobility status         Time: 2951-8841 PT Time Calculation (min): 20 min   Charges:   PT Evaluation $Initial PT Evaluation Tier I: 1 Procedure PT Treatments $Gait Training: 8-22 mins   PT G CodesShary Decamp Telesa Jeancharles 10/31/2013, 1:32 PM  [employer] PT [phone]

## 2013-10-31 NOTE — Consult Note (Signed)
Physical Medicine and Rehabilitation Consult Reason for Consult: Deconditioning/acute respiratory failure Referring Physician: Critical care   HPI: Peter [name] is a 63 y.o. right-handed male with complicated medical history of heavy alcohol abuse, COPD, liver cirrhosis. Patient was discharged from the hospital back in January for weakness in the setting of alcohol intoxication and dehydration and malnutrition. He was independent with a cane living with his brother prior to admission. Presented 10/25/2013 with progressive cough for the past 3-4 days as well sputum production with multiple loose stools and fever. Chest x-ray showed right middle lobe pneumonia. Placed on broad-spectrum antibiotics. Noted hyponatremia 123, white blood cell count 13,400 an alcohol level of 40. Patient did require intubation due to respiratory distress was extubated 10/30/2013. Placed on normal saline IV fluids with hyponatremia resolving. Patient with persistent anemia with question acute on chronic with latest hemoglobin 7.4 with question plan for EGD to further evaluate. Physical therapy evaluation completed 10/31/2013 at the recommendations of physical medicine rehabilitation consult.   Review of Systems  Constitutional: Positive for fever.  Respiratory: Positive for cough, sputum production and shortness of breath.   Neurological: Positive for weakness.  Psychiatric/Behavioral:       Anxiety  All other systems reviewed and are negative.  Past Medical History  Diagnosis Date  . Hypertension   . Hyperlipemia   . Emphysema   . Stroke   . Chronic pain   . Cirrhosis of liver   . Fall   . Dental injury   . Bone spur of other site     Left Foot  . Impaired mobility   . Total self care deficit   . Pneumonia   . Emphysema   . ETOH abuse   . Elevated LFTs   . Arthritis   . Anxiety   . Sinusitis   . Anemia     iron def.  . Hard of hearing   . Diabetes mellitus without complication   . COPD  (chronic obstructive pulmonary disease)   . Depression   . Glaucoma   . Emphysema of lung   . Cataract    Past Surgical History  Procedure Laterality Date  . Colonoscopy  01/06/2012    Procedure: COLONOSCOPY;  Surgeon: Beryle Beams, MD;  Location: [facility] ENDOSCOPY;  Service: Endoscopy;  Laterality: N/A;  . Esophagogastroduodenoscopy  01/06/2012    Procedure: ESOPHAGOGASTRODUODENOSCOPY (EGD);  Surgeon: Beryle Beams, MD;  Location: [facility] ENDOSCOPY;  Service: Endoscopy;  Laterality: N/A;  . Esophagogastroduodenoscopy  01/09/2012    Procedure: ESOPHAGOGASTRODUODENOSCOPY (EGD);  Surgeon: Beryle Beams, MD;  Location: [facility] ENDOSCOPY;  Service: Endoscopy;  Laterality: N/A;  . Multiple extractions with alveoloplasty  03/29/2012    Procedure: MULTIPLE EXTRACION WITH ALVEOLOPLASTY;  Surgeon: Gae Bon, DDS;  Location: [place];  Service: Oral Surgery;  Laterality: Bilateral;  . Bone spur  2013    left spur  . Cataract extraction w/ intraocular lens implant    . Mouth surgery    . Colonoscopy     Family History  Problem Relation Age of Onset  . Breast cancer Mother   . Diabetes Maternal Aunt   . Heart disease Sister   . Heart disease Maternal Aunt    Social History:  reports that he has been smoking Cigarettes.  He has a 60 pack-year smoking history. He has never used smokeless tobacco. He reports that he drinks about 2.4 ounces of alcohol per week. He reports that he does not use illicit  drugs. Allergies:  Allergies  Allergen Reactions  . Poison Ivy Extract [Extract Of Poison Ivy] Rash   Medications Prior to Admission  Medication Sig Dispense Refill  . acamprosate (CAMPRAL) 333 MG tablet Take 666 mg by mouth 3 (three) times daily with meals.      Marland Kitchen albuterol (PROVENTIL HFA;VENTOLIN HFA) 108 (90 BASE) MCG/ACT inhaler Inhale 2 puffs into the lungs 4 (four) times daily.      Marland Kitchen atorvastatin (LIPITOR) 80 MG tablet Take 1 tablet (80 mg total) by mouth daily.  30 tablet  2  . brimonidine (ALPHAGAN) 0.2  % ophthalmic solution Place 1 drop into both eyes 2 (two) times daily.      . budesonide-formoterol (SYMBICORT) 80-4.5 MCG/ACT inhaler Inhale 2 puffs into the lungs 2 (two) times daily.      . clotrimazole (LOTRIMIN) 1 % cream Apply topically 2 (two) times daily.  15 g  0  . ferrous sulfate 325 (65 FE) MG tablet Take 1 tablet (325 mg total) by mouth daily with breakfast. For low iron.  30 tablet  2  . folic acid (FOLVITE) 1 MG tablet Take 1 mg by mouth daily.      Marland Kitchen HYDROcodone-acetaminophen (NORCO/VICODIN) 5-325 MG per tablet Take 1 tablet by mouth every 6 (six) hours as needed for moderate pain.      . hydrOXYzine (ATARAX/VISTARIL) 25 MG tablet Take 25 mg by mouth 3 (three) times daily as needed for anxiety.      . Multiple Vitamin (MULTIVITAMIN) tablet Take 1 tablet by mouth daily.  30 tablet  0  . pantoprazole (PROTONIX) 40 MG tablet Take 40 mg by mouth daily.      . propranolol (INDERAL) 10 MG tablet Take 1 tablet (10 mg total) by mouth 3 (three) times daily.  90 tablet  3  . thiamine (VITAMIN B-1) 100 MG tablet Take 1 tablet (100 mg total) by mouth daily. For vitamin B deficiency.  30 tablet  2  . traZODone (DESYREL) 50 MG tablet Take 1 tablet (50 mg total) by mouth at bedtime.  30 tablet  0  . vitamin B-12 (CYANOCOBALAMIN) 100 MCG tablet Take 1 tablet (100 mcg total) by mouth daily. For low B12.  30 tablet  2  . vitamin E 200 UNIT capsule Take 1 capsule (200 Units total) by mouth daily.  7 capsule  0    Home: Home Living Family/patient expects to be discharged to:: Private residence Living Arrangements: Other relatives (brother and friend) Available Help at Discharge: Available 24 hours/day;Family Type of Home: House Home Access: Stairs to enter Technical brewer of Steps: 4 Entrance Stairs-Rails: None Home Layout: One level Home Equipment: Environmental consultant - 2 wheels;Cane - single point;Tub bench Additional Comments: normally wears dentures but currently does not have at [facility]    Functional History: Prior Function Level of Independence: Independent with assistive device(s) Comments: uses cane mostly Functional Status:  Mobility: Bed Mobility Overal bed mobility:  (OOB on arrival) Transfers Overall transfer level: Needs assistance Transfers: Sit to/from Stand Sit to Stand: Mod assist General transfer comment: pt requires heavy use of bil UE. pt unsteady with initial sit<>Stand      ADL: ADL Overall ADL's : Needs assistance/impaired Eating/Feeding: NPO Grooming: Wash/dry face;Set up Upper Body Bathing: Set up;Sitting Lower Body Bathing: Moderate assistance;Sit to/from stand Lower Body Bathing Details (indicate cue type and reason): decr activity tolerance and decr oxygen saturations Lower Body Dressing: Moderate assistance;Sit to/from stand Lower Body Dressing Details (indicate cue type and reason):  pt is able to perform knee flexion and position foot at edge of chair and touch ankle. Pt reports at baseline he leaves shoes with laces so that he can just slip his feet into the shoe without bending over Toilet Transfer: Moderate assistance General ADL Comments: Pt with elevated HR 120 with static standing and incr RR. Pt requesting food and educated on the need for SLP evaluation first. SLP notified. Pt pleasant and demonstrates decr activity tolerance. Pt with static standing weight shift demonstrates ataxic movement  Cognition: Cognition Overall Cognitive Status: Within Functional Limits for tasks assessed Orientation Level: Oriented to person;Oriented to place;Oriented to situation Cognition Arousal/Alertness: Awake/alert Behavior During Therapy: [facility] for tasks assessed/performed Overall Cognitive Status: Within Functional Limits for tasks assessed  Blood pressure 111/64, pulse 134, temperature 98.1 F (36.7 C), temperature source Oral, resp. rate 18, height 5' 7.5" (1.715 m), weight 71.6 kg (157 lb 13.6 oz), SpO2 100.00%. Physical Exam  Vitals  reviewed. Constitutional: He is oriented to person, place, and time.  HENT:  Head: Normocephalic.  Eyes: EOM are normal.  Neck: Normal range of motion. Neck supple. No thyromegaly present.  Cardiovascular: Normal rate and regular rhythm.   Respiratory:  Decreased breath sounds but clear to auscultation  GI: Soft. Bowel sounds are normal. He exhibits no distension.  Neurological: He is alert and oriented to person, place, and time.  Patient followed basic commands. He was a limited medical historian. Basic insight and awareness. UE's 4+/5. LE's 3/5 prox to 4/5 distally at ankles. Decreased sensation to LT in both feet, right more than left.   Skin: Skin is warm and dry.  Psychiatric:  Generally cooperative, pleasant    Results for orders placed during the hospital encounter of 10/25/13 (from the past 24 hour(s))  VANCOMYCIN, TROUGH     Status: Abnormal   Collection Time    10/30/13  3:30 PM      Result Value Ref Range   Vancomycin Tr 21.9 (*) 10.0 - 20.0 ug/mL  GLUCOSE, CAPILLARY     Status: Abnormal   Collection Time    10/30/13  3:53 PM      Result Value Ref Range   Glucose-Capillary 110 (*) 70 - 99 mg/dL  MAGNESIUM     Status: None   Collection Time    10/30/13  5:00 PM      Result Value Ref Range   Magnesium 2.3  1.5 - 2.5 mg/dL  PHOSPHORUS     Status: None   Collection Time    10/30/13  5:00 PM      Result Value Ref Range   Phosphorus 2.4  2.3 - 4.6 mg/dL  GLUCOSE, CAPILLARY     Status: None   Collection Time    10/30/13  7:53 PM      Result Value Ref Range   Glucose-Capillary 94  70 - 99 mg/dL  GLUCOSE, CAPILLARY     Status: None   Collection Time    10/30/13 11:43 PM      Result Value Ref Range   Glucose-Capillary 95  70 - 99 mg/dL  GLUCOSE, CAPILLARY     Status: None   Collection Time    10/31/13  3:35 AM      Result Value Ref Range   Glucose-Capillary 89  70 - 99 mg/dL  MAGNESIUM     Status: None   Collection Time    10/31/13  4:00 AM      Result Value  Ref Range   Magnesium  1.9  1.5 - 2.5 mg/dL  PHOSPHORUS     Status: None   Collection Time    10/31/13  4:00 AM      Result Value Ref Range   Phosphorus 3.7  2.3 - 4.6 mg/dL  CBC     Status: Abnormal   Collection Time    10/31/13  4:00 AM      Result Value Ref Range   WBC 7.8  4.0 - 10.5 K/uL   RBC 2.75 (*) 4.22 - 5.81 MIL/uL   Hemoglobin 7.4 (*) 13.0 - 17.0 g/dL   HCT 22.3 (*) 39.0 - 52.0 %   MCV 81.1  78.0 - 100.0 fL   MCH 26.9  26.0 - 34.0 pg   MCHC 33.2  30.0 - 36.0 g/dL   RDW 16.6 (*) 11.5 - 15.5 %   Platelets 274  150 - 400 K/uL  BASIC METABOLIC PANEL     Status: Abnormal   Collection Time    10/31/13  4:00 AM      Result Value Ref Range   Sodium 143  137 - 147 mEq/L   Potassium 3.0 (*) 3.7 - 5.3 mEq/L   Chloride 106  96 - 112 mEq/L   CO2 24  19 - 32 mEq/L   Glucose, Bld 90  70 - 99 mg/dL   BUN 6  6 - 23 mg/dL   Creatinine, Ser 0.84  0.50 - 1.35 mg/dL   Calcium 9.1  8.4 - 10.5 mg/dL   GFR calc non Af Amer >90  >90 mL/min   GFR calc Af Amer >90  >90 mL/min  VITAMIN B12     Status: None   Collection Time    10/31/13  4:00 AM      Result Value Ref Range   Vitamin B-12 616  211 - 911 pg/mL  FOLATE     Status: None   Collection Time    10/31/13  4:00 AM      Result Value Ref Range   Folate >20.0    IRON AND TIBC     Status: Abnormal   Collection Time    10/31/13  4:00 AM      Result Value Ref Range   Iron <10 (*) 42 - 135 ug/dL   TIBC Not calculated due to Iron <10.  215 - 435 ug/dL   Saturation Ratios Not calculated due to Iron <10.  20 - 55 %   UIBC 164  125 - 400 ug/dL  FERRITIN     Status: Abnormal   Collection Time    10/31/13  4:00 AM      Result Value Ref Range   Ferritin 349 (*) 22 - 322 ng/mL  RETICULOCYTES     Status: Abnormal   Collection Time    10/31/13  4:00 AM      Result Value Ref Range   Retic Ct Pct 1.7  0.4 - 3.1 %   RBC. 2.75 (*) 4.22 - 5.81 MIL/uL   Retic Count, Manual 46.8  19.0 - 186.0 K/uL  GLUCOSE, CAPILLARY     Status: None    Collection Time    10/31/13  7:29 AM      Result Value Ref Range   Glucose-Capillary 83  70 - 99 mg/dL  GLUCOSE, CAPILLARY     Status: Abnormal   Collection Time    10/31/13 11:35 AM      Result Value Ref Range   Glucose-Capillary 120 (*) 70 - 99 mg/dL  Dg Chest Port 1 View  10/31/2013   CLINICAL DATA:  Pneumonia, shortness of breath, history hypertension, cirrhosis, emphysema, diabetes, prior stroke  EXAM: PORTABLE CHEST - 1 VIEW  COMPARISON:  Portable exam 0509 hr compared to 10/30/2013  FINDINGS: Interval removal of endotracheal and nasogastric tubes.  RIGHT jugular central venous catheter with tip projecting over mid SVC.  Enlargement of cardiac silhouette with pulmonary vascular congestion.  Perihilar infiltrates compatible pulmonary edema.  Persistent small RIGHT pleural effusion and basilar atelectasis.  Probable dependent small LEFT pleural effusion as well.  No pneumothorax.  IMPRESSION: Enlargement of cardiac silhouette with pulmonary vascular congestion.  Pulmonary edema with bibasilar pleural effusions and RIGHT basilar atelectasis.   Electronically Signed   By: Lavonia Dana M.D.   On: 10/31/2013 07:17   Dg Chest Port 1 View  10/30/2013   CLINICAL DATA:  Endotracheal tube placement.  EXAM: PORTABLE CHEST - 1 VIEW  COMPARISON:  Chest radiograph performed 10/29/2013  FINDINGS: The patient endotracheal tube is seen ending 4-5 cm above the carina. An enteric tube is noted extending below the diaphragm. A right IJ line is noted ending about the distal SVC.  There has been mild interval redistribution of right basilar airspace opacification, which may reflect atelectasis or pneumonia. Patchy left basilar airspace opacity has mildly worsened. No definite pleural effusion or pneumothorax is seen.  The cardiomediastinal silhouette remains normal in size. No acute osseous abnormalities are identified. Deformity is again noted at the distal left clavicle.  IMPRESSION: 1. Endotracheal tube seen ending  4-5 cm above the carina. 2. Mild interval redistribution of right basilar airspace opacification, which may reflect atelectasis or pneumonia. Mild interval worsening of patchy left basilar airspace opacity. Underlying mild interstitial edema cannot be excluded.   Electronically Signed   By: Garald Balding M.D.   On: 10/30/2013 05:28    Assessment/Plan: Diagnosis: deconditioning after pneumonia and respiratory failure 1. Does the need for close, 24 hr/day medical supervision in concert with the patient's rehab needs make it unreasonable for this patient to be served in a less intensive setting? Yes 2. Co-Morbidities requiring supervision/potential complications: copd, hx of cva, CHF 3. Due to bladder management, bowel management, safety, skin/wound care, disease management, medication administration and patient education, does the patient require 24 hr/day rehab nursing? Yes 4. Does the patient require coordinated care of a physician, rehab nurse, PT (1-2 hrs/day, 5 days/week) and OT (1-2 hrs/day, 5 days/week) to address physical and functional deficits in the context of the above medical diagnosis(es)? Yes Addressing deficits in the following areas: balance, endurance, locomotion, strength, transferring, bowel/bladder control, bathing, dressing, feeding, grooming, toileting and psychosocial support 5. Can the patient actively participate in an intensive therapy program of at least 3 hrs of therapy per day at least 5 days per week? Yes 6. The potential for patient to make measurable gains while on inpatient rehab is good 7. Anticipated functional outcomes upon discharge from inpatient rehab are modified independent and supervision  with PT, modified independent and supervision with OT, n/a with SLP. 8. Estimated rehab length of stay to reach the above functional goals is: 8-12 days 9. Does the patient have adequate social supports to accommodate these discharge functional goals? Yes 10. Anticipated D/C  setting: Home 11. Anticipated post D/C treatments: [place] therapy 12. Overall Rehab/Functional Prognosis: good  RECOMMENDATIONS: This patient's condition is appropriate for continued rehabilitative care in the following setting: CIR Patient has agreed to participate in recommended program. Yes Note that insurance prior authorization  may be required for reimbursement for recommended care.  Comment: Rehab Admissions Coordinator to follow up.  Thanks,  Meredith Staggers, MD, Mellody Drown     10/31/2013

## 2013-10-31 NOTE — Progress Notes (Signed)
I will verify with pt's insurance, [employer], if an inpt rehab admission will be considered. [phone]

## 2013-10-31 NOTE — Evaluation (Signed)
Occupational Therapy Evaluation Patient Details Name: Peter [name] MRN: [id number] DOB: Jun 24, 1951 Today's Date: 10/31/2013    History of Present Illness Peter [name] is a 63 y.o. male admitted and found to have PNA sepsis with intubation. Pt with history of chronic alcoholism, cirrhosis of liver, ongoing tobacco abuse, COPD, hyperlipidemia, hypertension was brought to the ER after patient was found to be increasingly weak by patient's family. Pt intubated and extubated due to respiratory failure. 4/25 Bronchoscopy   Clinical Impression   PT admitted with respiratory failure with intubation due to PNA . Pt currently with functional limitiations due to the deficits listed below (see OT problem list).  Pt will benefit from skilled OT to increase their independence and safety with adls and balance to allow discharge CIR.     Follow Up Recommendations  CIR    Equipment Recommendations  Other (comment) (TBA)    Recommendations for Other Services Rehab consult     Precautions / Restrictions Precautions Precautions: Fall Precaution Comments: rectal pouch, Aline , foley, watch HR and Oxygen      Mobility Bed Mobility Overal bed mobility:  (OOB on arrival)                Transfers Overall transfer level: Needs assistance   Transfers: Sit to/from Stand Sit to Stand: Mod assist         General transfer comment: pt requires heavy use of bil UE. pt unsteady with initial sit<>Stand    Balance                                            ADL Overall ADL's : Needs assistance/impaired Eating/Feeding: NPO   Grooming: Wash/dry face;Set up   Upper Body Bathing: Set up;Sitting   Lower Body Bathing: Moderate assistance;Sit to/from stand Lower Body Bathing Details (indicate cue type and reason): decr activity tolerance and decr oxygen saturations     Lower Body Dressing: Moderate assistance;Sit to/from stand Lower Body Dressing Details (indicate cue  type and reason): pt is able to perform knee flexion and position foot at edge of chair and touch ankle. Pt reports at baseline he leaves shoes with laces so that he can just slip his feet into the shoe without bending over Toilet Transfer: Moderate assistance             General ADL Comments: Pt with elevated HR 120 with static standing and incr RR. Pt requesting food and educated on the need for SLP evaluation first. SLP notified. Pt pleasant and demonstrates decr activity tolerance. Pt with static standing weight shift demonstrates ataxic movement     Vision                 Additional Comments: reports glasses dont work anymore s/p cataract surg. Needs apointment for new glasses. Pt able to read large clock in room but unable to read room number ~72 font ~6 ft away   Perception Perception Perception Tested?: No   Praxis Praxis Praxis tested?: Not tested    Pertinent Vitals/Pain No pain reported Reports "hungry"      Hand Dominance Left   Extremity/Trunk Assessment Upper Extremity Assessment Upper Extremity Assessment: Overall WFL for tasks assessed   Lower Extremity Assessment Lower Extremity Assessment: Defer to PT evaluation   Cervical / Trunk Assessment Cervical / Trunk Assessment: Normal   Communication Communication Communication: No difficulties  Cognition Arousal/Alertness: Awake/alert Behavior During Therapy: WFL for tasks assessed/performed Overall Cognitive Status: Within Functional Limits for tasks assessed                     General Comments       Exercises       Shoulder Instructions      Home Living Family/patient expects to be discharged to:: Private residence Living Arrangements: Other relatives (brother and friend) Available Help at Discharge: Available 24 hours/day;Family Type of Home: House Home Access: Stairs to enter Technical brewer of Steps: 4 Entrance Stairs-Rails: None Home Layout: One level      Bathroom Shower/Tub: Corporate investment banker: Standard Bathroom Accessibility: Yes How Accessible: Accessible via walker Home Equipment: [place] - 2 wheels;Cane - single point;Tub bench   Additional Comments: normally wears dentures but currently does not have at [facility]      Prior Functioning/Environment Level of Independence: Independent with assistive device(s)        Comments: uses cane mostly    OT Diagnosis: Generalized weakness;Acute pain;Ataxia   OT Problem List: Decreased strength;Decreased activity tolerance;Impaired balance (sitting and/or standing);Decreased safety awareness;Decreased knowledge of use of DME or AE;Decreased knowledge of precautions;Pain   OT Treatment/Interventions: Self-care/ADL training;Therapeutic exercise;DME and/or AE instruction;Therapeutic activities;Balance training;Patient/family education    OT Goals(Current goals can be found in the care plan section) Acute Rehab OT Goals Patient Stated Goal: to eat- I havent eaten in 3 days OT Goal Formulation: With patient Time For Goal Achievement: 11/14/13 Potential to Achieve Goals: Good  OT Frequency: Min 2X/week   Barriers to D/C:            Co-evaluation              End of Session Nurse Communication: Mobility status;Precautions  Activity Tolerance: Patient tolerated treatment well Patient left: in chair;with call bell/phone within reach   Time: 5956-3875 OT Time Calculation (min): 18 min Charges:  OT General Charges $OT Visit: 1 Procedure OT Evaluation $Initial OT Evaluation Tier I: 1 Procedure OT Treatments $Self Care/Home Management : 8-22 mins G-Codes:    Peri Maris 2013/11/05, 8:32 AM Pager: [phone]

## 2013-10-31 NOTE — Progress Notes (Signed)
Rehab Admissions Coordinator Note:  Patient was screened by Quentin Mulling Shakim Faith for appropriateness for an Inpatient Acute Rehab Consult.  At this time, we are recommending Inpatient Rehab consult.  Stanislav Gervase L Alany Borman, PT 10/31/2013, 8:39 AM  I can be reached at [phone].

## 2013-10-31 NOTE — Progress Notes (Signed)
PULMONARY / CRITICAL CARE MEDICINE   Name: Peter [name] MRN: [id number] DOB: 10/29/1950    ADMISSION DATE:  10/25/2013 CONSULTATION DATE:  10/28/2013  REFERRING MD : Dia Crawford  PRIMARY SERVICE: [facility]   CHIEF COMPLAINT: Shortness of  breath  BRIEF PATIENT DESCRIPTION:  63 yo male smoker, alcoholic admitted 5/73 with melanotic stools, cough, sputum found to have pneumonia with respiratory failure requiring ventilatory support  and delirium tremens due to heavy alcohol abuse.    SIGNIFICANT EVENTS: 4/21 Admitted to [facility]  4/24  VDRF. DT's 4/25 Bronchoscopy revealed outake of the RML was very small and tight, completely obstructed with thick secretions that were all removed. 4/26 Extubated successfully    LINES / TUBES: ETT 4/24 >>4/26 CVL 4/24 >>   CULTURES: MRSA 4/21 NEG  Sputum 4/21 NEG Strep Pneum/Legionella  4/21 NEG Stool culture 4/22>>> Resp viral panel 4/23>>>Metapneumovirus  Ova and parasite 4/22 NEG Sputum culture 4/21: MODERATE HAEMOPHILUS INFLUENZAE (BETA LACTAMASE POSITIVE) BCX2 4/22>>> BCX2 4/21>>> Cdiff 4/21 NEG  GI pathogen 4/23 NEG Urine 4/21 >100K multiple bacterial morphotypes  Bronchial Gram Stain 4/25 >>> Bronchial AFB 4/25 >>> Bronchial Fungal 4/25 >>> Bronchial Culture 4/25 >>>  ANTIBIOTICS: Cefepime 4/21>>>4/22 Zosyn 4/22>>> Vanc  4/22>>>    SUBJECTIVE:   No acute events overnight. Pt extubated yesterday. Currently breathing comfortably on 5L [place]. Still with productive cough and nasal congestion. Tremors resolved. Denies abdominal pain.  Hungry and asking for food.   VITAL SIGNS: Temp:  [98.7 F (37.1 C)-100.5 F (38.1 C)] 99.4 F (37.4 C) (04/27 0400) Pulse Rate:  [35-123] 94 (04/27 0500) Resp:  [15-26] 20 (04/27 0500) BP: (101-159)/(70-97) 143/89 mmHg (04/27 0500) SpO2:  [81 %-100 %] 100 % (04/27 0500) FiO2 (%):  [30 %] 30 % (04/26 1237) Weight:  [71.6 kg (157 lb 13.6 oz)] 71.6 kg (157 lb 13.6 oz) (04/27  0441) HEMODYNAMICS: CVP:  [10 mmHg-12 mmHg] 10 mmHg VENTILATOR SETTINGS: Vent Mode:  [-] PSV;CPAP FiO2 (%):  [30 %] 30 % PEEP:  [5 cmH20] 5 cmH20 Pressure Support:  [8 cmH20] 8 cmH20 INTAKE / OUTPUT:  Intake/Output Summary (Last 24 hours) at 10/31/13 2202 Last data filed at 10/31/13 5427  Gross per 24 hour  Intake 1806.8 ml  Output   2060 ml  Net -253.2 ml    PHYSICAL EXAMINATION: General: NAD  Neuro: A & O x 3 HEENT:  Edentulous Cardiovascular: Regular rate and rhythm  Lungs: Course bibasilar breath sounds with poor air flow.  Abdomen: Tight, no abdominal tenderness, decreased BS Musculoskeletal: SCD's, no edema  Skin: Warm, dry   LABS:  CBC  Recent Labs Lab 10/29/13 0445 10/30/13 0440 10/31/13 0400  WBC 7.8 6.7 7.8  HGB 7.4* 7.9* 7.4*  HCT 21.9* 23.7* 22.3*  PLT 188 242 274   Coag's  Recent Labs Lab 10/27/13 0328  INR 1.17   BMET  Recent Labs Lab 10/29/13 0445 10/30/13 0440 10/31/13 0400  NA 142 145 143  K 3.4* 3.2* 3.0*  CL 107 108 106  CO2 22 23 24   BUN 11 9 6   CREATININE 0.91 0.78 0.84  GLUCOSE 105* 110* 90   Electrolytes  Recent Labs Lab 10/29/13 0445 10/30/13 0440 10/30/13 1700 10/31/13 0400  CALCIUM 8.4 8.8  --  9.1  MG 1.4* 1.9 2.3 1.9  PHOS  --  2.7 2.4 3.7   Sepsis Markers  Recent Labs Lab 10/25/13 1942 10/25/13 2004 10/28/13 0331  LATICACIDVEN 3.50* 2.0 1.6   ABG  Recent Labs Lab  10/28/13 0524 10/28/13 1654 10/30/13 0310  PHART 7.356 7.387 7.440  PCO2ART 36.8 41.7 36.0  PO2ART 57.9* 391.0* 88.4   Liver Enzymes  Recent Labs Lab 10/28/13 0331 10/29/13 0445 10/30/13 0440  AST 37 29 23  ALT 22 17 16   ALKPHOS 78 66 72  BILITOT 0.3 0.4 0.2*  ALBUMIN 2.6* 2.4* 2.4*   Cardiac Enzymes  Recent Labs Lab 10/25/13 2155 10/26/13 0340 10/26/13 0925  TROPONINI <0.30 <0.30 <0.30   Glucose  Recent Labs Lab 10/30/13 0703 10/30/13 1103 10/30/13 1553 10/30/13 1953 10/30/13 2343 10/31/13 0335  GLUCAP  85 90 110* 94 95 89    Imaging Dg Chest Port 1 View  10/30/2013   CLINICAL DATA:  Endotracheal tube placement.  EXAM: PORTABLE CHEST - 1 VIEW  COMPARISON:  Chest radiograph performed 10/29/2013  FINDINGS: The patient endotracheal tube is seen ending 4-5 cm above the carina. An enteric tube is noted extending below the diaphragm. A right IJ line is noted ending about the distal SVC.  There has been mild interval redistribution of right basilar airspace opacification, which may reflect atelectasis or pneumonia. Patchy left basilar airspace opacity has mildly worsened. No definite pleural effusion or pneumothorax is seen.  The cardiomediastinal silhouette remains normal in size. No acute osseous abnormalities are identified. Deformity is again noted at the distal left clavicle.  IMPRESSION: 1. Endotracheal tube seen ending 4-5 cm above the carina. 2. Mild interval redistribution of right basilar airspace opacification, which may reflect atelectasis or pneumonia. Mild interval worsening of patchy left basilar airspace opacity. Underlying mild interstitial edema cannot be excluded.   Electronically Signed   By: Garald Balding M.D.   On: 10/30/2013 05:28     ASSESSMENT / PLAN:  PULMONARY A:  Acute respiratory failure requiring ventilatory support    - s/p bronchoscopy on 4/25 with removal of mucous plugging. Extubated on 4/26. Currently 100% on 5L O2   Metapneumovirus and Haemophilus Influenza Positive  RML collapse Pulmonary Edema with bibasilar pleural effusions  History of aspiration PNA History of COPD  Tobacco abuse   P:   Supplemental O2 as needed to keep SpO2 > 92% Continue zosyn, d/c vanco 4/27 Continue duoNeb Q 6 hr F/U bronchial washing studies D/C IVF's, consider lasix depending on I/O trend IS, chest PT, flutter valve given partial RML collapse   CARDIOVASCULAR A:  History of Cardiomyopathy  History of Systolic CHF (EF 45 to 40% on 2D echo 01/14/13) History of  Hypertension History of Hyperlipidemia  P:  Monitor on telemetry CVP monitoring with goal 10-12 > d/c CVP's Consider repeat 2D-echo   RENAL A:   Mild hyponatremia - resolved Hypokalemia  Hypomagnesmia  P:   D/c NS @ 50 mL/hr Replace K & Mg  Keep even volume status   GASTROINTESTINAL A:   Melena  - most likely due to gastritis in setting of heavy ethanol use (last EGD 05/19/13) History of alcoholic cirrhosis Protein calorie malnutrition  P:   Protonix  BID SLP consult pending Advance diet as tolerated Folic acid and thiamine daily  Consider EGD if bleeding continues  HEMATOLOGIC A:  Microcytic/Normocytic Anemia  DVT Ppx P:  PPI BID  Transfuse if Hg <7 F/u anemia panel SCD for DVT prevention  INFECTIOUS A:   Haemophilus influenza PNA (beta lactamase positive) and Metapneumovirus  Possible aspiration PNA   -in setting of heavy alcohol use  Positive UA with cultures with (mult orgs >100K) Severe sepsis - resolved  History of Acinetobacter UTI (Jan  2015) P:   F/u cultures Continue zosyn 8 days total, d/c vanco 4/27  ENDOCRINE A:   Hyperglycemia - resolved TSH normal (1.775) on 08/02/13 P:   Sensitive SSI Monitor CBG > change to qac + hs  NEUROLOGIC A:   Acute encephalopathy - resolved Delirium tremens - resolved Heavy alcohol use (Ethanol 40 on admission)  History of Stroke P:   Thiamine and folic acid daily PT/OT consults  OPTHALMOLOGY A: History of glaucoma P: Continue alphagan eye drops  Juluis Mire, MD PGY-1 IMTS  Plan transfer to floor on 4/27, to [facility] as of 4/28.   Baltazar Apo, MD, PhD 10/31/2013, 10:00 AM [facility] [phone] or if no answer [phone]

## 2013-10-31 NOTE — Evaluation (Signed)
Clinical/Bedside Swallow Evaluation Patient Details  Name: Peter [name] MRN: [id number] Date of Birth: 1951-02-13  Today's Date: 10/31/2013 Time: 0900-0918 SLP Time Calculation (min): 18 min  Past Medical History:  Past Medical History  Diagnosis Date  . Hypertension   . Hyperlipemia   . Emphysema   . Stroke   . Chronic pain   . Cirrhosis of liver   . Fall   . Dental injury   . Bone spur of other site     Left Foot  . Impaired mobility   . Total self care deficit   . Pneumonia   . Emphysema   . ETOH abuse   . Elevated LFTs   . Arthritis   . Anxiety   . Sinusitis   . Anemia     iron def.  . Hard of hearing   . Diabetes mellitus without complication   . COPD (chronic obstructive pulmonary disease)   . Depression   . Glaucoma   . Emphysema of lung   . Cataract    Past Surgical History:  Past Surgical History  Procedure Laterality Date  . Colonoscopy  01/06/2012    Procedure: COLONOSCOPY;  Surgeon: Beryle Beams, MD;  Location: [facility] ENDOSCOPY;  Service: Endoscopy;  Laterality: N/A;  . Esophagogastroduodenoscopy  01/06/2012    Procedure: ESOPHAGOGASTRODUODENOSCOPY (EGD);  Surgeon: Beryle Beams, MD;  Location: [facility] ENDOSCOPY;  Service: Endoscopy;  Laterality: N/A;  . Esophagogastroduodenoscopy  01/09/2012    Procedure: ESOPHAGOGASTRODUODENOSCOPY (EGD);  Surgeon: Beryle Beams, MD;  Location[facility] ENDOSCOPY;  Service: Endoscopy;  Laterality: N/A;  . Multiple extractions with alveoloplasty  03/29/2012    Procedure: MULTIPLE EXTRACION WITH ALVEOLOPLASTY;  Surgeon: Gae Bon, DDS;  Location: [place];  Service: Oral Surgery;  Laterality: Bilateral;  . Bone spur  2013    left spur  . Cataract extraction w/ intraocular lens implant    . Mouth surgery    . Colonoscopy     HPI:  63 yo male smoker, alcoholic admitted 1/02 with melanotic stools, cough, sputum found to have pneumonia with respiratory failure requiring ventilatory support and delirium tremens due to heavy alcohol  abuse. Addtional PMH of aspiration PNA, COPD, cirrhosis of the liver, HTN. ETT 4/24-4/26. RML inflitrate noted on admission CXR. Previous h/o dysphagia noted 12/2011. MBS 01/05/12 recommended dysphagia 2, thin liquids. No penetration or aspiration noted at that time   Assessment / Plan / Recommendation Clinical Impression  Patient presents with a functional oropharyngeal swallow consistent with most recent MBS results. Oral phase mildly delayed due to lack of dentition however patient independently compensating with alternation of moist solids or liquid wash. No overt s/s of aspiration noted. Recommend initiation of dysphagia 3 diet, thin liquids. Reviewed general safe swallowing precautions and aspiration risks, including COPD, CVA, recent intubation, with patient. Question impact of intoxication on airway protection in general prior to admit.  No SLP f/u indicated at this time.    Aspiration Risk  Mild    Diet Recommendation Dysphagia 3 (Mechanical Soft);Thin liquid   Liquid Administration via: Cup;Straw Medication Administration: Whole meds with liquid Supervision: Patient able to self feed Compensations: Slow rate;Small sips/bites    Other  Recommendations Oral Care Recommendations: Oral care BID   Follow Up Recommendations  None    Frequency and Duration        Pertinent Vitals/Pain n/a        Swallow Study Prior Functional Status  Type of Home: House Available Help at Discharge: Available 24  hours/day;Family    General HPI: 63 yo male smoker, alcoholic admitted 1/24 with melanotic stools, cough, sputum found to have pneumonia with respiratory failure requiring ventilatory support and delirium tremens due to heavy alcohol abuse. Addtional PMH of aspiration PNA, COPD, cirrhosis of the liver, HTN. ETT 4/24-4/26. RML inflitrate noted on admission CXR. Previous h/o dysphagia noted 12/2011. MBS 01/05/12 recommended dysphagia 2, thin liquids. No penetration or aspiration noted at that  time Type of Study: Bedside swallow evaluation Previous Swallow Assessment: see HPI Diet Prior to this Study: NPO Temperature Spikes Noted: No Respiratory Status: Nasal cannula History of Recent Intubation: Yes Length of Intubations (days): 2 days Date extubated: 10/30/13 Behavior/Cognition: Alert;Cooperative;Pleasant mood Oral Cavity - Dentition: Edentulous (dentures at home, does not wear for meals) Self-Feeding Abilities: Able to feed self Patient Positioning: Upright in chair Baseline Vocal Quality: Clear Volitional Cough: Strong Volitional Swallow: Able to elicit    Oral/Motor/Sensory Function Overall Oral Motor/Sensory Function: Appears within functional limits for tasks assessed (except mild tremors)   Ice Chips Ice chips: Not tested   Thin Liquid Thin Liquid: Within functional limits Presentation: Cup;Self Fed;Straw    Nectar Thick Nectar Thick Liquid: Not tested   Honey Thick Honey Thick Liquid: Not tested   Puree Puree: Within functional limits Presentation: Self Fed;Spoon   Solid   GO   Adalee Kathan MA, CCC-SLP [phone]  Solid: Within functional limits Presentation: [place] 10/31/2013,9:23 AM

## 2013-10-31 NOTE — Progress Notes (Signed)
[facility] ADULT ICU REPLACEMENT PROTOCOL FOR AM LAB REPLACEMENT ONLY  The patient does apply for the [facility] Adult ICU Electrolyte Replacment Protocol based on the criteria listed below:   1. Is GFR >/= 40 ml/min? yes  Patient's GFR today is >90 2. Is urine output >/= 0.5 ml/kg/hr for the last 6 hours? yes Patient's UOP is 1.1 ml/kg/hr 3. Is BUN < 60 mg/dL? yes  Patient's BUN today is 6 4. Abnormal electrolyte(s): K+3 5. Ordered repletion with: protocol 6. If a panic level lab has been reported, has the CCM MD in charge been notified? yes.   Physician:  Venita Sheffield E Ramzah 10/31/2013 5:50 AM

## 2013-11-01 LAB — CULTURE, BAL-QUANTITATIVE W GRAM STAIN: Culture: NO GROWTH

## 2013-11-01 LAB — GLUCOSE, CAPILLARY
GLUCOSE-CAPILLARY: 108 mg/dL — AB (ref 70–99)
Glucose-Capillary: 73 mg/dL (ref 70–99)
Glucose-Capillary: 77 mg/dL (ref 70–99)
Glucose-Capillary: 104 mg/dL — ABNORMAL HIGH (ref 70–99)

## 2013-11-01 LAB — CULTURE, BLOOD (ROUTINE X 2)
CULTURE: NO GROWTH
CULTURE: NO GROWTH
Culture: NO GROWTH
Culture: NO GROWTH

## 2013-11-01 LAB — CULTURE, BAL-QUANTITATIVE
COLONY COUNT: NO GROWTH
GRAM STAIN: NONE SEEN

## 2013-11-01 MED ORDER — IPRATROPIUM-ALBUTEROL 0.5-2.5 (3) MG/3ML IN SOLN: 3.0000 mL | RESPIRATORY_TRACT | Status: DC | PRN

## 2013-11-01 MED ORDER — FUROSEMIDE 10 MG/ML IJ SOLN
20.0000 mg | Freq: Every day | INTRAMUSCULAR | Status: DC
  Administered 2013-11-01 – 2013-11-04 (×4): 20 mg via INTRAVENOUS
  Filled 2013-11-01 (×4): qty 2

## 2013-11-01 MED ORDER — POTASSIUM CHLORIDE CRYS ER 20 MEQ PO TBCR
40.0000 meq | EXTENDED_RELEASE_TABLET | Freq: Two times a day (BID) | ORAL | Status: AC
  Administered 2013-11-01 – 2013-11-02 (×2): 40 meq via ORAL
  Filled 2013-11-01 (×2): qty 2

## 2013-11-01 NOTE — Progress Notes (Signed)
Physical Therapy Treatment Patient Details Name: Peter [name] MRN: [id number] DOB: 06-20-1951 Today's Date: 11/01/2013    History of Present Illness Peter [name] is a 63 y.o. male admitted and found to have PNA sepsis with intubation. Pt with history of chronic alcoholism, cirrhosis of liver, ongoing tobacco abuse, COPD, hyperlipidemia, hypertension was brought to the ER after patient was found to be increasingly weak by patient's family. Pt intubated and extubated due to respiratory failure. 4/25 Bronchoscopy    PT Comments    Making progress with activity, though continues to require supplemental O2 with activity Will need to clarifiy exactly how much assist is available to pt at home  Follow Up Recommendations  Supervision/Assistance - 24 hour;Home health PT (Insurance denied CIR, and pt is refusing SNF)     Equipment Recommendations  Rolling walker with 5" wheels (pt may already have)    Recommendations for Other Services       Precautions / Restrictions Precautions Precautions: Fall Precaution Comments: watch HR and O2    Mobility  Bed Mobility Overal bed mobility: Modified Independent                Transfers Overall transfer level: Needs assistance Equipment used: Rolling walker (2 wheeled) Transfers: Sit to/from Stand Sit to Stand: Min guard Stand pivot transfers: Min guard       General transfer comment: Cues for hand placement and safety  Ambulation/Gait Ambulation/Gait assistance: Min guard Ambulation Distance (Feet): 100 Feet Assistive device: Rolling walker (2 wheeled) Gait Pattern/deviations: Step-through pattern;Trunk flexed Gait velocity: decr   General Gait Details: Verbal cues to stand more erect, and keep RW close; Also cues to self-monitor for activity tolerance   Stairs            Wheelchair Mobility    Modified Rankin (Stroke Patients Only)       Balance Overall balance assessment: Needs assistance            Standing balance-Leahy Scale: Fair                      Cognition Arousal/Alertness: Awake/alert Behavior During Therapy: WFL for tasks assessed/performed Overall Cognitive Status: Within Functional Limits for tasks assessed                      Exercises      General Comments        Pertinent Vitals/Pain See also other PT note of this date Pt became quite tachycardic during activity, HR 147 (observed highest) 120 bpm post seated rest    Home Living                      Prior Function            PT Goals (current goals can now be found in the care plan section) Acute Rehab PT Goals Patient Stated Goal: Return home PT Goal Formulation: With patient Time For Goal Achievement: 11/07/13 Potential to Achieve Goals: Good Progress towards PT goals: Progressing toward goals    Frequency  Min 3X/week    PT Plan Discharge pan updated to home with HHPT    Co-evaluation             End of Session Equipment Utilized During Treatment: Gait belt;Oxygen Activity Tolerance: Other (comment) (Limited by low O2 sats) Patient left: in bed;with call bell/phone within reach;with bed alarm set (sitting EOB)     Time: 1696-7893 PT Time Calculation (  min): 26 min  Charges:  $Gait Training: 23-37 mins                    G Codes:      Peter [name] 11/01/2013, 1:18 PM  Peter [name], [place] Pager [phone] Office [phone]

## 2013-11-01 NOTE — Progress Notes (Signed)
Occupational Therapy Treatment Patient Details Name: Peter [name] MRN: [id number] DOB: 08-20-1950 Today's Date: 11/01/2013    History of present illness CLERANCE UMLAND is a 63 y.o. male admitted and found to have PNA sepsis with intubation. Pt with history of chronic alcoholism, cirrhosis of liver, ongoing tobacco abuse, COPD, hyperlipidemia, hypertension was brought to the ER after patient was found to be increasingly weak by patient's family. Pt intubated and extubated due to respiratory failure. 4/25 Bronchoscopy   OT comments  Agreeable to OOB for bathing.  Able to complete UB/LB selfcare with set up/s.  Cues for breathing strategies as he did become SOB with functional tasks  Follow Up Recommendations  SNF;Supervision/Assistance - 24 hour -note pt. Not a CIR candidate and currently refusing SNF. Would rec. S at home if continues to refuse SNF                Precautions / Restrictions Precautions Precautions: Fall Precaution Comments: rectal pouch, Aline , foley, watch HR and Oxygen       Mobility Bed Mobility Overal bed mobility: Modified Independent                Transfers Overall transfer level: Needs assistance Equipment used: 1 person hand held assist Transfers: Sit to/from [facility] Sit to Stand: Min guard Stand pivot transfers: Min guard       General transfer comment: pt. used HHA with intermittent furniture walking to/from bed to sink                                       ADL Overall ADL's : Needs assistance/impaired     Grooming: Wash/dry hands;Wash/dry face;Applying deodorant;Set up;Sitting   Upper Body Bathing: Set up;Sitting   Lower Body Bathing: Min guard;Sit to/from stand   Upper Body Dressing : Min guard;Sitting   Lower Body Dressing: Set up;Sitting/lateral leans   Toilet Transfer: Min Psychiatric nurse Details (indicate cue type and reason): sim. eob amb. to chair at sink, HHA   Toileting -  Clothing Manipulation Details (indicate cue type and reason): s, sit/stand simulated       General ADL Comments: ub/lb b/d sit/stand at sink.  cues for breathing tech. during functional tasks, pt. s/set up for all components.  initiated ue support on counter while in standing                                                                                                    Pertinent Vitals/ Pain     Denies pain                                                          Frequency Min 2X/week     Progress Toward Goals  OT Goals(current goals can now be found in the care plan  section)  Progress towards OT goals: Progressing toward goals     Plan Discharge plan needs to be updated                     End of Session     Activity Tolerance Patient tolerated treatment well   Patient Left in bed;with call bell/phone within reach   Nurse Communication          Time: 2119-4174 OT Time Calculation (min): 23 min  Charges: OT General Charges $OT Visit: 1 Procedure OT Treatments $Self Care/Home Management : 23-37 mins  Elida, COTA/L 11/01/2013, 11:58 AM

## 2013-11-01 NOTE — Progress Notes (Signed)
Physical Therapy Treatment Note   SATURATION QUALIFICATIONS: (This note is used to comply with regulatory documentation for home oxygen)  Patient Saturations on Room Air at Rest = 90%  Patient Saturations on Room Air while Ambulating = 81%  Patient Saturations on 3 Liters of oxygen while Ambulating = 90-92%  Please briefly explain why patient needs home oxygen: Patient requires supplemental oxygen to maintain oxygen saturation at safe, acceptable levels during activity.  (Full PT treatment note to follow)  Roney Marion, [employer] Pager [phone] Office [phone]

## 2013-11-01 NOTE — Progress Notes (Signed)
Patient has been in contact with [facility] telephonically. Came to bedside to discuss services. However, he interrupted Probation officer by saying " I do not want anyone calling and drilling me", I will call you if I am interested". Left brochure at bedside for him to call in the future. Will make inpatient RNCM aware of visit.  Marthenia Rolling, MSN- [facility] Liaison361 [phone]

## 2013-11-01 NOTE — Progress Notes (Signed)
[employer] will not approve an inpt acute rehab admission for this diagnosis at this time. I met with pt and he is aware. He is currently not in agreement to SNF. States he will go home at d/c. I have alerted RN CM and SW, Kendell Bane. Please call me with any questions. [phone]

## 2013-11-01 NOTE — Progress Notes (Signed)
Patient ID: Peter [name], male   DOB: [birth year], 63 y.o.   MRN: [id number]  [facility] PRO[facility]ESS NOTE  TYMEER VAQUERA OXB:[id number] DOB: 1950-08-26 DOA: 10/25/2013 PCP: No primary provider on file.  Brief narrative: 63 yo male smoker, alcoholic admitted 6/83 with melanotic stools, cough, sputum found to have pneumonia with respiratory failure requiring ventilatory support and delirium tremens due to heavy alcohol abuse.   SIGNIFICANT EVENTS:  4/21 Admitted to [facility]  4/24 VDRF. DT's  4/25 Bronchoscopy revealed outake of the RML was very small and tight, completely obstructed with thick secretions that were all removed.  4/26 Extubated successfully  4/27 CXR Pulmonary vascular congestion LINES / TUBES:  ETT 4/24 >>4/26  CVL 4/24 >>  CULTURES:  MRSA 4/21 NEG  Sputum 4/21 NEG  Strep Pneum/Legionella 4/21 NEG  Stool culture 4/22>>>  Resp viral panel 4/23>>>Metapneumovirus  Ova and parasite 4/22 NEG  Sputum culture 4/21: MODERATE HAEMOPHILUS INFLUENZAE (BETA LACTAMASE POSITIVE)  BCX2 4/22>>>  BCX2 4/21>>>  Cdiff 4/21 NEG  GI pathogen 4/23 NEG  Urine 4/21 >100K multiple bacterial morphotypes  Bronchial Gram Stain 4/25 >>>  Bronchial AFB 4/25 >>>  Bronchial Fungal 4/25 >>>  Bronchial Culture 4/25 >>>  ANTIBIOTICS:  Cefepime 4/21>>>4/22  Zosyn 4/22>>> 4/29 Vanc 4/22>>>   Assessment and plan: Acute respiratory failure requiring ventilatory support (pt with history of COPD and aspiration PNA, tobacco abuse) - s/p bronchoscopy on 4/25 with removal of mucous plugging. Extubated on 4/26. Currently 100% on 5L O2  - Metapneumovirus and Haemophilus Influenza Positive with RML collapse  - Supplemental O2 as needed to keep SpO2 > 92%  - Continue zosyn, d/c vanco 4/27  - Continue duoNeb Q 6 hr  - F/U bronchial washing studies  - stop IVF and place on Lasix  - continue IS, chest PT, flutter valve given partial RML collapse  Systolic CHF (EF 45 to 41% on 2D echo 01/14/13)  - no  events on tele, consider d/c in 24 hours if no events  - CVP monitoring with goal 10-12 > d/c CVP's  - place on Lasix 20 mg IV QD, order 2 D ECHO, strict I's and O's, daily weights  - weight today is 159 (154 lbs on admission) Electrolyte disturbance  - Hypokalemia - continue to supplement as indicated and repeat BMP in AM - Hypomagnesmia - resolved  - Hyponatremia - resolved  Melena  - most likely due to gastritis in setting of heavy ethanol use (last EGD 05/19/13)  Protein calorie malnutrition  - Protonix BID  - SLP consult pending  - Advance diet as tolerated  - Folic acid and thiamine daily  - Will consider EGD if bleeding continues  Microcytic/Normocytic Anemia  - PPI BID  - Transfuse if Hg <7  - SCD for DVT prevention  Haemophilus influenza PNA (beta lactamase positive) and Metapneumovirus  - Possible aspiration PNA in setting of heavy alcohol use  - Positive UA with cultures with (mult orgs >100K)  - Severe sepsis - resolved  - Continue zosyn 8 days total (can d/c 4/29), d/c vanco 4/27  Hyperglycemia  - resolved  - Sensitive SSI  - Monitor CBG > change to qac + hs  Acute encephalopathy - resolved  Delirium tremens - resolved  Heavy alcohol use (Ethanol 40 on admission)   Consultants:  PCCM transferred care to [facility] 4/28  Code Status: Full Family Communication: Pt at bedside Disposition Plan: Home when medically stable, pt does not want to go to SNF and  has not been accepted to inpatient rehab   HPI/Subjective: No events overnight.   Objective: Filed Vitals:   11/01/13 0545 11/01/13 0811 11/01/13 1130 11/01/13 1716  BP: 160/100 156/101 145/91 138/80  Pulse: 112 116 113 109  Temp: 100.5 F (38.1 C) 99.1 F (37.3 C) 99.3 F (37.4 C) 99.1 F (37.3 C)  TempSrc: Oral Oral Oral Oral  Resp: 18 20 20 20   Height:      Weight:      SpO2: 98% 95% 99% 97%    Intake/Output Summary (Last 24 hours) at 11/01/13 1737 Last data filed at 11/01/13 1355  Gross per 24  hour  Intake    480 ml  Output   1890 ml  Net  -1410 ml    Exam:   General:  Pt is alert, follows commands appropriately, not in acute distress  Cardiovascular: Regular rhythm, tachycardic, no rubs, no gallops  Respiratory: Course breath sounds bilaterally with crackles at bases   Abdomen: Soft, non tender, non distended, bowel sounds present, no guarding  Extremities: No edema, pulses DP and PT palpable bilaterally  Neuro: Grossly nonfocal  Data Reviewed: Basic Metabolic Panel:  Recent Labs Lab 10/28/13 0331 10/28/13 1700 10/29/13 0445 10/30/13 0440 10/30/13 1700 10/31/13 0400  NA 135* 142 142 145  --  143  K 3.8 3.0* 3.4* 3.2*  --  3.0*  CL 100 106 107 108  --  106  CO2 20 22 22 23   --  24  GLUCOSE 137* 114* 105* 110*  --  90  BUN 11 12 11 9   --  6  CREATININE 0.94 1.05 0.91 0.78  --  0.84  CALCIUM 8.1* 8.3* 8.4 8.8  --  9.1  MG 1.5 1.3* 1.4* 1.9 2.3 1.9  PHOS  --   --   --  2.7 2.4 3.7   Liver Function Tests:  Recent Labs Lab 10/26/13 0340 10/27/13 0328 10/28/13 0331 10/29/13 0445 10/30/13 0440  AST 47* 39* 37 29 23  ALT 26 20 22 17 16   ALKPHOS 94 83 78 66 72  BILITOT 0.6 0.5 0.3 0.4 0.2*  PROT 7.4 6.4 6.8 6.6 6.5  ALBUMIN 3.1* 2.4* 2.6* 2.4* 2.4*    Recent Labs Lab 10/25/13 2155  AMMONIA 26   CBC:  Recent Labs Lab 10/25/13 1930 10/26/13 0340  10/28/13 0331 10/28/13 1700 10/29/13 0445 10/30/13 0440 10/31/13 0400  WBC 13.4* 13.2*  < > 8.2 9.8 7.8 6.7 7.8  NEUTROABS 11.8* 11.7*  --  7.5  --   --   --   --   HGB 9.7* 9.7*  < > 8.0* 7.3* 7.4* 7.9* 7.4*  HCT 28.1* 27.9*  < > 24.0* 21.5* 21.9* 23.7* 22.3*  MCV 80.1 78.6  < > 81.9 79.9 80.2 80.3 81.1  PLT 170 172  < > 188 180 188 242 274  < > = values in this interval not displayed. Cardiac Enzymes:  Recent Labs Lab 10/25/13 2155 10/26/13 0340 10/26/13 0925  TROPONINI <0.30 <0.30 <0.30   CBG:  Recent Labs Lab 10/31/13 1135 10/31/13 1233 10/31/13 1708 10/31/13 2153  11/01/13 0812  GLUCAP 120* 90 111* 107* 73    Recent Results (from the past 240 hour(s))  CULTURE, BLOOD (ROUTINE X 2)     Status: None   Collection Time    10/25/13  6:30 PM      Result Value Ref Range Status   Specimen Description BLOOD RIGHT FOREARM   Final   Special Requests  Final   Value: BOTTLES DRAWN AEROBIC AND ANAEROBIC 10CCBLUE 8CCRED   Culture  Setup Time     Final   Value: 10/26/2013 00:21     Performed at [facility]   Culture     Final   Value: NO GROWTH 5 DAYS     Performed at [facility]   Report Status 11/01/2013 FINAL   Final  CULTURE, BLOOD (ROUTINE X 2)     Status: None   Collection Time    10/25/13  7:24 PM      Result Value Ref Range Status   Specimen Description BLOOD RIGHT HAND   Final   Special Requests BOTTLES DRAWN AEROBIC ONLY 5 CC   Final   Culture  Setup Time     Final   Value: 10/26/2013 03:58     Performed at [facility]   Culture     Final   Value: NO GROWTH 5 DAYS     Performed at [facility]   Report Status 11/01/2013 FINAL   Final  URINE CULTURE     Status: None   Collection Time    10/25/13  7:39 PM      Result Value Ref Range Status   Specimen Description URINE, CLEAN CATCH   Final   Special Requests NONE   Final   Culture  Setup Time     Final   Value: 10/26/2013 00:27     Performed at [place]     Final   Value: >=100,000 COLONIES/ML     Performed at [facility]   Culture     Final   Value: Multiple bacterial morphotypes present, none predominant. Suggest appropriate recollection if clinically indicated.     Performed at [facility]   Report Status 10/27/2013 FINAL   Final  CULTURE, EXPECTORATED SPUTUM-ASSESSMENT     Status: None   Collection Time    10/25/13  8:00 PM      Result Value Ref Range Status   Specimen Description SPUTUM   Final   Special Requests Normal   Final   Sputum evaluation     Final   Value: THIS SPECIMEN IS  ACCEPTABLE. RESPIRATORY CULTURE REPORT TO FOLLOW.   Report Status 10/25/2013 FINAL   Final  CULTURE, RESPIRATORY (NON-EXPECTORATED)     Status: None   Collection Time    10/25/13  8:00 PM      Result Value Ref Range Status   Specimen Description SPUTUM   Final   Special Requests NONE   Final   Gram Stain     Final   Value: ABUNDANT WBC PRESENT,BOTH PMN AND MONONUCLEAR     NO SQUAMOUS EPITHELIAL CELLS SEEN     ABUNDANT GRAM NEGATIVE RODS     FEW GRAM POSITIVE COCCI IN PAIRS     Performed at [facility]   Culture     Final   Value: MODERATE HAEMOPHILUS INFLUENZAE     Note: BETA LACTAMASE POSITIVE     Performed at [facility]   Report Status 10/27/2013 FINAL   Final  MRSA PCR SCREENING     Status: None   Collection Time    10/25/13 11:44 PM      Result Value Ref Range Status   MRSA by PCR NEGATIVE  NEGATIVE Final   Comment:            The GeneXpert MRSA Assay (FDA  approved for NASAL specimens     only), is one component of a     comprehensive MRSA colonization     surveillance program. It is not     intended to diagnose MRSA     infection nor to guide or     monitor treatment for     MRSA infections.  CLOSTRIDIUM DIFFICILE BY PCR     Status: None   Collection Time    10/26/13 12:46 AM      Result Value Ref Range Status   C difficile by pcr NEGATIVE  NEGATIVE Final  STOOL CULTURE     Status: None   Collection Time    10/26/13 12:47 AM      Result Value Ref Range Status   Specimen Description STOOL   Final   Special Requests NONE   Final   Culture     Final   Value: NO SALMONELLA, SHIGELLA, CAMPYLOBACTER, YERSINIA, OR E.COLI 0157:H7 ISOLATED     Performed at [facility]   Report Status 10/30/2013 FINAL   Final  OVA AND PARASITE EXAMINATION     Status: None   Collection Time    10/26/13 12:47 AM      Result Value Ref Range Status   Specimen Description STOOL   Final   Special Requests NONE   Final   Ova and parasites     Final   Value:  NO OVA OR PARASITES SEEN     Performed at [facility]   Report Status 10/27/2013 FINAL   Final  CULTURE, BLOOD (ROUTINE X 2)     Status: None   Collection Time    10/26/13 12:50 PM      Result Value Ref Range Status   Specimen Description BLOOD RIGHT ARM   Final   Special Requests BOTTLES DRAWN AEROBIC ONLY 6CC   Final   Culture  Setup Time     Final   Value: 10/26/2013 16:29     Performed at [facility]   Culture     Final   Value: NO GROWTH 5 DAYS     Performed at [facility]   Report Status 11/01/2013 FINAL   Final  CULTURE, BLOOD (ROUTINE X 2)     Status: None   Collection Time    10/26/13  1:00 PM      Result Value Ref Range Status   Specimen Description BLOOD RIGHT HAND   Final   Special Requests BOTTLES DRAWN AEROBIC ONLY [facility]   Final   Culture  Setup Time     Final   Value: 10/26/2013 16:29     Performed at [facility]   Culture     Final   Value: NO GROWTH 5 DAYS     Performed at [facility]   Report Status 11/01/2013 FINAL   Final  RESPIRATORY VIRUS PANEL     Status: Abnormal   Collection Time    10/27/13  7:52 PM      Result Value Ref Range Status   Source - RVPAN NASOPHARYNGEAL   Final   Respiratory Syncytial Virus A NOT DETECTED   Final   Respiratory Syncytial Virus B NOT DETECTED   Final   Influenza A NOT DETECTED   Final   Influenza B NOT DETECTED   Final   Parainfluenza 1 NOT DETECTED   Final   Parainfluenza 2 NOT DETECTED   Final   Parainfluenza 3 NOT DETECTED   Final  Metapneumovirus DETECTED (*)  Final   Rhinovirus NOT DETECTED   Final   Adenovirus NOT DETECTED   Final   Influenza A H1 NOT DETECTED   Final   Influenza A H3 NOT DETECTED   Final   Comment: (NOTE)           Normal Reference Range for each Analyte: NOT DETECTED     Testing performed using the Luminex xTAG Respiratory Viral Panel test     kit.     This test was developed and its performance characteristics determined     by [employer]. It has not been cleared or approved by the [place]     Food and Drug Administration. This test is used for clinical purposes.     It should not be regarded as investigational or for research. This     laboratory is certified under the [place] (CLIA) as qualified to perform high complexity     clinical laboratory testing.     Performed at [place], RESPIRATORY (NON-EXPECTORATED)     Status: None   Collection Time    10/28/13  4:49 PM      Result Value Ref Range Status   Specimen Description ENDOTRACHEAL   Final   Special Requests NONE   Final   Gram Stain     Final   Value: FEW WBC PRESENT,BOTH PMN AND MONONUCLEAR     NO SQUAMOUS EPITHELIAL CELLS SEEN     NO ORGANISMS SEEN     Performed at [facility]   Culture     Final   Value: Non-Pathogenic Oropharyngeal-type Flora Isolated.     Performed at [facility]   Report Status 10/31/2013 FINAL   Final  AFB CULTURE WITH SMEAR     Status: None   Collection Time    10/29/13 11:06 AM      Result Value Ref Range Status   Specimen Description BRONCHIAL WASHINGS   Final   Special Requests NONE   Final   Acid Fast Smear     Final   Value: NO ACID FAST BACILLI SEEN     Performed at [facility]   Culture     Final   Value: CULTURE WILL BE EXAMINED FOR 6 WEEKS BEFORE ISSUING A FINAL REPORT     Performed at [facility]   Report Status PENDING   Incomplete  CULTURE, BAL-QUANTITATIVE     Status: None   Collection Time    10/29/13 11:06 AM      Result Value Ref Range Status   Specimen Description BRONCHIAL ALVEOLAR LAVAGE   Final   Special Requests NONE   Final   Gram Stain     Final   Value: NO WBC SEEN     NO SQUAMOUS EPITHELIAL CELLS SEEN     NO ORGANISMS SEEN     Performed at [facility]     Final   Value: NO GROWTH     Performed at [facility]   Culture     Final   Value: NO GROWTH 2 DAYS      Performed at [facility]   Report Status 11/01/2013 FINAL   Final  FUNGUS CULTURE W SMEAR     Status: None   Collection Time    10/29/13 11:06 AM      Result Value Ref Range Status  Specimen Description BRONCHIAL WASHINGS   Final   Special Requests NONE   Final   Fungal Smear     Final   Value: NO YEAST OR FUNGAL ELEMENTS SEEN     Performed at [facility]   Culture     Final   Value: CULTURE IN PROGRESS FOR FOUR WEEKS     Performed at [facility]   Report Status PENDING   Incomplete     Scheduled Meds: . brimonidine  1 drop Both Eyes BID  . folic acid  1 mg Oral Daily  . insulin aspart  0-9 Units Subcutaneous TID WC  . ipratropium-albuterol  3 mL Nebulization Q6H  . pantoprazole  40 mg Oral BID  . ZOSYN  IV  3.375 g Intravenous Q8H  . thiamine  100 mg Oral Daily   Continuous Infusions:  Theodis Blaze, MD  [facility] Pager [phone]  If 7PM-7AM, please contact night-coverage [email or web address] Password [facility] 11/01/2013, 5:37 PM   LOS: 7 days

## 2013-11-02 DIAGNOSIS — I517 Cardiomegaly: Secondary | ICD-10-CM

## 2013-11-02 LAB — BASIC METABOLIC PANEL
BUN: 7 mg/dL (ref 6–23)
CO2: 24 meq/L (ref 19–32)
Calcium: 9.2 mg/dL (ref 8.4–10.5)
Chloride: 108 mEq/L (ref 96–112)
Creatinine, Ser: 1.21 mg/dL (ref 0.50–1.35)
GFR calc Af Amer: 73 mL/min — ABNORMAL LOW (ref >90)
GFR calc non Af Amer: 63 mL/min — ABNORMAL LOW (ref >90)
GLUCOSE: 85 mg/dL (ref 70–99)
POTASSIUM: 3.8 meq/L (ref 3.7–5.3)
Sodium: 148 mEq/L — ABNORMAL HIGH (ref 137–147)

## 2013-11-02 LAB — CBC
HCT: 22.7 % — ABNORMAL LOW (ref 39.0–52.0)
HEMOGLOBIN: 7.4 g/dL — AB (ref 13.0–17.0)
MCH: 26.9 pg (ref 26.0–34.0)
MCHC: 32.6 g/dL (ref 30.0–36.0)
MCV: 82.5 fL (ref 78.0–100.0)
Platelets: 399 10*3/uL (ref 150–400)
RBC: 2.75 MIL/uL — AB (ref 4.22–5.81)
RDW: 16.8 % — ABNORMAL HIGH (ref 11.5–15.5)
WBC: 5.7 10*3/uL (ref 4.0–10.5)

## 2013-11-02 LAB — GLUCOSE, CAPILLARY
GLUCOSE-CAPILLARY: 103 mg/dL — AB (ref 70–99)
Glucose-Capillary: 81 mg/dL (ref 70–99)
Glucose-Capillary: 100 mg/dL — ABNORMAL HIGH (ref 70–99)
Glucose-Capillary: 109 mg/dL — ABNORMAL HIGH (ref 70–99)

## 2013-11-02 LAB — PRO B NATRIURETIC PEPTIDE: Pro B Natriuretic peptide (BNP): 12677 pg/mL — ABNORMAL HIGH (ref 0–125)

## 2013-11-02 MED ORDER — IPRATROPIUM-ALBUTEROL 0.5-2.5 (3) MG/3ML IN SOLN
3.0000 mL | Freq: Three times a day (TID) | RESPIRATORY_TRACT | Status: DC
  Administered 2013-11-03 – 2013-11-04 (×5): 3 mL via RESPIRATORY_TRACT
  Filled 2013-11-02 (×5): qty 3

## 2013-11-02 NOTE — Progress Notes (Signed)
  Echocardiogram 2D Echocardiogram has been performed.  Valinda Hoar 11/02/2013, 11:40 AM

## 2013-11-02 NOTE — Progress Notes (Signed)
Patient ID: Peter [name], male   DOB: 26-Jan-1951, 63 y.o.   MRN: [id number]  [facility] PROGRESS NOTE  Peter [name] [id number] DOB: 1951/02/19 DOA: 10/25/2013 PCP: No primary provider on file.  Brief narrative: 63 yo male smoker, alcoholic admitted 5/36 with melanotic stools, cough, sputum found to have pneumonia with respiratory failure requiring ventilatory support and delirium tremens due to heavy alcohol abuse.   SIGNIFICANT EVENTS:  4/21 Admitted to [facility]  4/24 VDRF. DT's  4/25 Bronchoscopy revealed outake of the RML was very small and tight, completely obstructed with thick secretions that were all removed.  4/26 Extubated successfully  4/27 CXR Pulmonary vascular congestion LINES / TUBES:  ETT 4/24 >>4/26  CVL 4/24 >>  CULTURES:  MRSA 4/21 NEG  Sputum 4/21 NEG  Strep Pneum/Legionella 4/21 NEG  Stool culture 4/22>>>  Resp viral panel 4/23>>>Metapneumovirus  Ova and parasite 4/22 NEG  Sputum culture 4/21: MODERATE HAEMOPHILUS INFLUENZAE (BETA LACTAMASE POSITIVE)  BCX2 4/22>>>  BCX2 4/21>>>  Cdiff 4/21 NEG  GI pathogen 4/23 NEG  Urine 4/21 >100K multiple bacterial morphotypes  Bronchial Gram Stain 4/25 >>>  Bronchial AFB 4/25 >>>  Bronchial Fungal 4/25 >>>  Bronchial Culture 4/25 >>>  ANTIBIOTICS:  Cefepime 4/21>>>4/22  Zosyn 4/22>>> 4/29 Vanc 4/22>>> 4/27  Assessment and plan: Acute respiratory failure requiring ventilatory support (pt with history of COPD and aspiration PNA, tobacco abuse) - s/p bronchoscopy on 4/25 with removal of mucous plugging. Extubated on 4/26. Currently 100% on 5L O2  - Metapneumovirus and Haemophilus Influenza Positive with RML collapse  - Supplemental O2 as needed to keep SpO2 > 92% - remains on 3Lnc - d/c'd zosyn 4/29, d/c'd vanco 4/27  - Continue duoNeb Q 6 hr  - bronchial washing studies neg for afb - stopped IVF and started Lasix  - continue IS, chest PT, flutter valve given partial RML collapse  Systolic CHF (EF 45 to  14% on 2D echo 01/14/13)  - no events on tele, consider d/c soon if no further events  - CVP monitoring with goal 10-12 > d/c CVP's  - place on Lasix 20 mg IV QD, order 2 D ECHO, strict I's and O's, daily weights  - weight today is 157.9 (154 lbs on admission) Electrolyte disturbance  - Hypokalemia - continue to supplement as indicated and repeat BMP in AM - Hypomagnesmia - resolved  - Hyponatremia - resolved  Melena  - most likely due to gastritis in setting of heavy ethanol use (last EGD 05/19/13)  Protein calorie malnutrition  - Protonix BID  - SLP consult pending  - Advance diet as tolerated  - Folic acid and thiamine daily  - Will consider EGD if bleeding continues  Microcytic/Normocytic Anemia  - PPI BID  - Transfuse if Hg <7  - SCD for DVT prevention  Haemophilus influenza PNA (beta lactamase positive) and Metapneumovirus  - Possible aspiration PNA in setting of heavy alcohol use  - Positive UA with cultures with (mult orgs >100K)  - Severe sepsis - resolved  - Continue zosyn 8 days total (d/c 4/29), d/c vanco 4/27  Hyperglycemia  - resolved  - Sensitive SSI  - Monitor CBG > change to qac + hs  Acute encephalopathy - resolved  Delirium tremens - resolved  Heavy alcohol use (Ethanol 40 on admission)   Consultants:  PCCM transferred care to [facility] 4/28  Code Status: Full Family Communication: Pt at bedside Disposition Plan: Home when medically stable, pt does not want to go to  SNF and has not been accepted to inpatient rehab   HPI/Subjective: No events overnight. Pt without complaints.  Objective: Filed Vitals:   11/02/13 0223 11/02/13 0528 11/02/13 0820 11/02/13 0828  BP:  133/90  150/89  Pulse:  98  95  Temp:  99.8 F (37.7 C)  99.1 F (37.3 C)  TempSrc:  Oral  Oral  Resp:  17  18  Height:      Weight:      SpO2: 96% 100% 95% 100%    Intake/Output Summary (Last 24 hours) at 11/02/13 1001 Last data filed at 11/02/13 0500  Gross per 24 hour  Intake     480 ml  Output   2400 ml  Net  -1920 ml    Exam:   General:  Pt is alert, follows commands appropriately, not in acute distress  Cardiovascular: Regular rhythm, tachycardic, no rubs, no gallops  Respiratory: Course breath sounds bilaterally with crackles at bases   Abdomen: Soft, non tender, non distended, bowel sounds present, no guarding  Extremities: No edema, pulses DP and PT palpable bilaterally  Neuro: Grossly nonfocal  Data Reviewed: Basic Metabolic Panel:  Recent Labs Lab 10/28/13 1700 10/29/13 0445 10/30/13 0440 10/30/13 1700 10/31/13 0400 11/02/13 0736  NA 142 142 145  --  143 148*  K 3.0* 3.4* 3.2*  --  3.0* 3.8  CL 106 107 108  --  106 108  CO2 22 22 23   --  24 24  GLUCOSE 114* 105* 110*  --  90 85  BUN 12 11 9   --  6 7  CREATININE 1.05 0.91 0.78  --  0.84 1.21  CALCIUM 8.3* 8.4 8.8  --  9.1 9.2  MG 1.3* 1.4* 1.9 2.3 1.9  --   PHOS  --   --  2.7 2.4 3.7  --    Liver Function Tests:  Recent Labs Lab 10/27/13 0328 10/28/13 0331 10/29/13 0445 10/30/13 0440  AST 39* 37 29 23  ALT 20 22 17 16   ALKPHOS 83 78 66 72  BILITOT 0.5 0.3 0.4 0.2*  PROT 6.4 6.8 6.6 6.5  ALBUMIN 2.4* 2.6* 2.4* 2.4*   No results found for this basename: AMMONIA,  in the last 168 hours CBC:  Recent Labs Lab 10/28/13 0331 10/28/13 1700 10/29/13 0445 10/30/13 0440 10/31/13 0400 11/02/13 0736  WBC 8.2 9.8 7.8 6.7 7.8 5.7  NEUTROABS 7.5  --   --   --   --   --   HGB 8.0* 7.3* 7.4* 7.9* 7.4* 7.4*  HCT 24.0* 21.5* 21.9* 23.7* 22.3* 22.7*  MCV 81.9 79.9 80.2 80.3 81.1 82.5  PLT 188 180 188 242 274 399   Cardiac Enzymes: No results found for this basename: CKTOTAL, CKMB, CKMBINDEX, TROPONINI,  in the last 168 hours CBG:  Recent Labs Lab 11/01/13 0812 11/01/13 1129 11/01/13 1715 11/01/13 2146 11/02/13 0828  GLUCAP 73 77 108* 104* 81    Recent Results (from the past 240 hour(s))  CULTURE, BLOOD (ROUTINE X 2)     Status: None   Collection Time    10/25/13   6:30 PM      Result Value Ref Range Status   Specimen Description BLOOD RIGHT FOREARM   Final   Special Requests     Final   Value: BOTTLES DRAWN AEROBIC AND ANAEROBIC 10CCBLUE 8CCRED   Culture  Setup Time     Final   Value: 10/26/2013 00:21     Performed at [facility]  Culture     Final   Value: NO GROWTH 5 DAYS     Performed at [facility]   Report Status 11/01/2013 FINAL   Final  CULTURE, BLOOD (ROUTINE X 2)     Status: None   Collection Time    10/25/13  7:24 PM      Result Value Ref Range Status   Specimen Description BLOOD RIGHT HAND   Final   Special Requests BOTTLES DRAWN AEROBIC ONLY 5 CC   Final   Culture  Setup Time     Final   Value: 10/26/2013 03:58     Performed at [facility]   Culture     Final   Value: NO GROWTH 5 DAYS     Performed at [facility]   Report Status 11/01/2013 FINAL   Final  URINE CULTURE     Status: None   Collection Time    10/25/13  7:39 PM      Result Value Ref Range Status   Specimen Description URINE, CLEAN CATCH   Final   Special Requests NONE   Final   Culture  Setup Time     Final   Value: 10/26/2013 00:27     Performed at [place]     Final   Value: >=100,000 COLONIES/ML     Performed at [facility]   Culture     Final   Value: Multiple bacterial morphotypes present, none predominant. Suggest appropriate recollection if clinically indicated.     Performed at [facility]   Report Status 10/27/2013 FINAL   Final  CULTURE, EXPECTORATED SPUTUM-ASSESSMENT     Status: None   Collection Time    10/25/13  8:00 PM      Result Value Ref Range Status   Specimen Description SPUTUM   Final   Special Requests Normal   Final   Sputum evaluation     Final   Value: THIS SPECIMEN IS ACCEPTABLE. RESPIRATORY CULTURE REPORT TO FOLLOW.   Report Status 10/25/2013 FINAL   Final  CULTURE, RESPIRATORY (NON-EXPECTORATED)     Status: None   Collection Time    10/25/13   8:00 PM      Result Value Ref Range Status   Specimen Description SPUTUM   Final   Special Requests NONE   Final   Gram Stain     Final   Value: ABUNDANT WBC PRESENT,BOTH PMN AND MONONUCLEAR     NO SQUAMOUS EPITHELIAL CELLS SEEN     ABUNDANT GRAM NEGATIVE RODS     FEW GRAM POSITIVE COCCI IN PAIRS     Performed at [facility]   Culture     Final   Value: MODERATE HAEMOPHILUS INFLUENZAE     Note: BETA LACTAMASE POSITIVE     Performed at [facility]   Report Status 10/27/2013 FINAL   Final  MRSA PCR SCREENING     Status: None   Collection Time    10/25/13 11:44 PM      Result Value Ref Range Status   MRSA by PCR NEGATIVE  NEGATIVE Final   Comment:            The GeneXpert MRSA Assay (FDA     approved for NASAL specimens     only), is one component of a     comprehensive MRSA colonization     surveillance program. It is not     intended to diagnose  MRSA     infection nor to guide or     monitor treatment for     MRSA infections.  CLOSTRIDIUM DIFFICILE BY PCR     Status: None   Collection Time    10/26/13 12:46 AM      Result Value Ref Range Status   C difficile by pcr NEGATIVE  NEGATIVE Final  STOOL CULTURE     Status: None   Collection Time    10/26/13 12:47 AM      Result Value Ref Range Status   Specimen Description STOOL   Final   Special Requests NONE   Final   Culture     Final   Value: NO SALMONELLA, SHIGELLA, CAMPYLOBACTER, YERSINIA, OR E.COLI 0157:H7 ISOLATED     Performed at [facility]   Report Status 10/30/2013 FINAL   Final  OVA AND PARASITE EXAMINATION     Status: None   Collection Time    10/26/13 12:47 AM      Result Value Ref Range Status   Specimen Description STOOL   Final   Special Requests NONE   Final   Ova and parasites     Final   Value: NO OVA OR PARASITES SEEN     Performed at [facility]   Report Status 10/27/2013 FINAL   Final  CULTURE, BLOOD (ROUTINE X 2)     Status: None   Collection Time     10/26/13 12:50 PM      Result Value Ref Range Status   Specimen Description BLOOD RIGHT ARM   Final   Special Requests BOTTLES DRAWN AEROBIC ONLY 6CC   Final   Culture  Setup Time     Final   Value: 10/26/2013 16:29     Performed at [facility]   Culture     Final   Value: NO GROWTH 5 DAYS     Performed at [facility]   Report Status 11/01/2013 FINAL   Final  CULTURE, BLOOD (ROUTINE X 2)     Status: None   Collection Time    10/26/13  1:00 PM      Result Value Ref Range Status   Specimen Description BLOOD RIGHT HAND   Final   Special Requests BOTTLES DRAWN AEROBIC ONLY [facility]   Final   Culture  Setup Time     Final   Value: 10/26/2013 16:29     Performed at [facility]   Culture     Final   Value: NO GROWTH 5 DAYS     Performed at [facility]   Report Status 11/01/2013 FINAL   Final  RESPIRATORY VIRUS PANEL     Status: Abnormal   Collection Time    10/27/13  7:52 PM      Result Value Ref Range Status   Source - RVPAN NASOPHARYNGEAL   Final   Respiratory Syncytial Virus A NOT DETECTED   Final   Respiratory Syncytial Virus B NOT DETECTED   Final   Influenza A NOT DETECTED   Final   Influenza B NOT DETECTED   Final   Parainfluenza 1 NOT DETECTED   Final   Parainfluenza 2 NOT DETECTED   Final   Parainfluenza 3 NOT DETECTED   Final   Metapneumovirus DETECTED (*)  Final   Rhinovirus NOT DETECTED   Final   Adenovirus NOT DETECTED   Final   Influenza A H1 NOT DETECTED   Final   Influenza A  H3 NOT DETECTED   Final   Comment: (NOTE)           Normal Reference Range for each Analyte: NOT DETECTED     Testing performed using the Luminex xTAG Respiratory Viral Panel test     kit.     This test was developed and its performance characteristics determined     by [facility]. It has not been cleared or approved by the [place]     Food and Drug Administration. This test is used for clinical purposes.     It should not be regarded as investigational  or for research. This     laboratory is certified under the [place] (CLIA) as qualified to perform high complexity     clinical laboratory testing.     Performed at [place], RESPIRATORY (NON-EXPECTORATED)     Status: None   Collection Time    10/28/13  4:49 PM      Result Value Ref Range Status   Specimen Description ENDOTRACHEAL   Final   Special Requests NONE   Final   Gram Stain     Final   Value: FEW WBC PRESENT,BOTH PMN AND MONONUCLEAR     NO SQUAMOUS EPITHELIAL CELLS SEEN     NO ORGANISMS SEEN     Performed at [facility]   Culture     Final   Value: Non-Pathogenic Oropharyngeal-type Flora Isolated.     Performed at [facility]   Report Status 10/31/2013 FINAL   Final  AFB CULTURE WITH SMEAR     Status: None   Collection Time    10/29/13 11:06 AM      Result Value Ref Range Status   Specimen Description BRONCHIAL WASHINGS   Final   Special Requests NONE   Final   Acid Fast Smear     Final   Value: NO ACID FAST BACILLI SEEN     Performed at [facility]   Culture     Final   Value: CULTURE WILL BE EXAMINED FOR 6 WEEKS BEFORE ISSUING A FINAL REPORT     Performed at [facility]   Report Status PENDING   Incomplete  CULTURE, BAL-QUANTITATIVE     Status: None   Collection Time    10/29/13 11:06 AM      Result Value Ref Range Status   Specimen Description BRONCHIAL ALVEOLAR LAVAGE   Final   Special Requests NONE   Final   Gram Stain     Final   Value: NO WBC SEEN     NO SQUAMOUS EPITHELIAL CELLS SEEN     NO ORGANISMS SEEN     Performed at [facility]     Final   Value: NO GROWTH     Performed at [facility]   Culture     Final   Value: NO GROWTH 2 DAYS     Performed at [facility]   Report Status 11/01/2013 FINAL   Final  FUNGUS CULTURE W SMEAR     Status: None   Collection Time    10/29/13 11:06 AM      Result Value  Ref Range Status   Specimen Description BRONCHIAL WASHINGS   Final   Special Requests NONE   Final   Fungal Smear     Final   Value: NO YEAST OR FUNGAL ELEMENTS SEEN  Performed at [facility]     Final   Value: CULTURE IN PROGRESS FOR FOUR WEEKS     Performed at [facility]   Report Status PENDING   Incomplete     Scheduled Meds: . brimonidine  1 drop Both Eyes BID  . folic acid  1 mg Oral Daily  . insulin aspart  0-9 Units Subcutaneous TID WC  . ipratropium-albuterol  3 mL Nebulization Q6H  . pantoprazole  40 mg Oral BID  . ZOSYN  IV  3.375 g Intravenous Q8H  . thiamine  100 mg Oral Daily   Continuous Infusions:  Donne Hazel, MD  [facility] Pager [phone]  If 7PM-7AM, please contact night-coverage [email or web address] Password [email or web address] 11/02/2013, 10:01 AM   LOS: 8 days

## 2013-11-02 NOTE — Progress Notes (Signed)
CSW met with patient to discuss PT's recommendation for short term rehab. He was evaluated for CIR but is unable to be accepted due to insurance issues.  SNF was discussed by [facility] and patient refused SNF. CSW follow-up with patient to see if patient changed his mind but he remains adamant that he will not agree to SNF.  He plans to go home and states that he will manage well- he plans for his mother to come and pick him up at d/c.  CSW will notify [facility] for further follow up and will sign off. CSW will be available to assist as needed.  Lorie Phenix. [place], [place]

## 2013-11-02 NOTE — Progress Notes (Signed)
CSW notified by MD that after he spoke to patient and his mother- he now agrees to short term SNF.  CSW met with patient and mother to discuss bed search process; they do not have a preference as to placement.  Bed search initiated and awaiting bed offers. Possible stability tomorrow per MD.  Patient will require short term rehab.  Fl2 updated and sent to facilities for review. Lorie Phenix. [place], [place]

## 2013-11-02 NOTE — Progress Notes (Signed)
Pt stated he had already done flutter and IS before I entered room. Pt stated he coughed up sm amt of clear secretions.

## 2013-11-02 NOTE — Progress Notes (Signed)
Pt ambulated to the nurses station and back with front wheel walker. Pt complained of feeling dizzy but did not require stopping to rest. On room air while resting pt at 96-99%, pt up to bathroom on room air, SpO2 down to 89%. With activity pt HR up to 145 bpm and SpO2 dropped to 83% while ambulating in the hall (approx 47ft). Placed pt on 2L O2 , pt SpO2 up to 90%. Ambulated the pt from doorway of room to nurses station and back on 2LNC, pt SpO2 93%. Weaned oxygen to 2L. Will attempt to wean without activity to room air once DOE has resolved. Will continue to monitor.

## 2013-11-02 NOTE — Progress Notes (Signed)
Pt 88% SpO2 on room air. Increased to 1LNC, increased to 90%. Increased to [facility], SpO2 93-94%. Will continue to monitor.Marland Kitchen

## 2013-11-02 NOTE — Progress Notes (Signed)
SATURATION QUALIFICATIONS: (This note is used to comply with regulatory documentation for home oxygen)  Patient Saturations on Room Air at Rest = 96%  Patient Saturations on Room Air while Ambulating = 83%  Patient Saturations on 2 Liters of oxygen while Ambulating = 93%  Please briefly explain why patient needs home oxygen: Pt desaturating with activity, increased DOE.

## 2013-11-03 LAB — GLUCOSE, CAPILLARY
GLUCOSE-CAPILLARY: 102 mg/dL — AB (ref 70–99)
Glucose-Capillary: 84 mg/dL (ref 70–99)
Glucose-Capillary: 79 mg/dL (ref 70–99)
Glucose-Capillary: 108 mg/dL — ABNORMAL HIGH (ref 70–99)

## 2013-11-03 NOTE — Progress Notes (Signed)
Patient ID: Peter [name], male   DOB: 11/23/1950, 63 y.o.   MRN: [id number]  [facility] PROGRESS NOTE  ILARIO DHALIWAL TKP:[id number] DOB: 03/17/1951 DOA: 10/25/2013 PCP: No primary provider on file.  Brief narrative: 63 yo male smoker, alcoholic admitted 5/17 with melanotic stools, cough, sputum found to have pneumonia with respiratory failure requiring ventilatory support and delirium tremens due to heavy alcohol abuse.   SIGNIFICANT EVENTS:  4/21 Admitted to [facility]  4/24 VDRF. DT's  4/25 Bronchoscopy revealed outake of the RML was very small and tight, completely obstructed with thick secretions that were all removed.  4/26 Extubated successfully  4/27 CXR Pulmonary vascular congestion LINES / TUBES:  ETT 4/24 >>4/26  CVL 4/24 >>  CULTURES:  MRSA 4/21 NEG  Sputum 4/21 NEG  Strep Pneum/Legionella 4/21 NEG  Stool culture 4/22>>>  Resp viral panel 4/23>>>Metapneumovirus  Ova and parasite 4/22 NEG  Sputum culture 4/21: MODERATE HAEMOPHILUS INFLUENZAE (BETA LACTAMASE POSITIVE)  BCX2 4/22>>>  BCX2 4/21>>>  Cdiff 4/21 NEG  GI pathogen 4/23 NEG  Urine 4/21 >100K multiple bacterial morphotypes  Bronchial Gram Stain 4/25 >>>  Bronchial AFB 4/25 >>>  Bronchial Fungal 4/25 >>>  Bronchial Culture 4/25 >>>  ANTIBIOTICS:  Cefepime 4/21>>>4/22  Zosyn 4/22>>> 4/29 Vanc 4/22>>> 4/27  Assessment and plan: Acute respiratory failure requiring ventilatory support (pt with history of COPD and aspiration PNA, tobacco abuse) - s/p bronchoscopy on 4/25 with removal of mucous plugging. Extubated on 4/26. Currently 100% on 5L O2  - Metapneumovirus and Haemophilus Influenza Positive with RML collapse  - Supplemental O2 as needed to keep SpO2 > 92% - remains on 3Lnc - d/c'd zosyn 4/29, d/c'd vanco 4/27  - Continue duoNeb Q 6 hr  - bronchial washing studies neg for afb - stopped IVF and started Lasix  - continue IS, chest PT, flutter valve given partial RML collapse - Awaiting  placement Systolic CHF (EF 45 to 00% on 2D echo 01/14/13)  - no events on tele, consider d/c soon if no further events  - CVP monitoring with goal 10-12 > d/c CVP's  - place on Lasix 20 mg IV QD, order 2 D ECHO, strict I's and O's, daily weights  - weight today is 157.9 (154 lbs on admission) Electrolyte disturbance  - Hypokalemia - continue to supplement as indicated and repeat BMP in AM - Hypomagnesmia - resolved  - Hyponatremia - resolved  Melena  - most likely due to gastritis in setting of heavy ethanol use (last EGD 05/19/13)  Protein calorie malnutrition  - Protonix BID  - SLP consult pending  - Advance diet as tolerated  - Folic acid and thiamine daily  - Will consider EGD if bleeding continues  Microcytic/Normocytic Anemia  - PPI BID  - Transfuse if Hg <7  - SCD for DVT prevention  Haemophilus influenza PNA (beta lactamase positive) and Metapneumovirus  - Possible aspiration PNA in setting of heavy alcohol use  - Positive UA with cultures with (mult orgs >100K)  - Severe sepsis - resolved  - Continue zosyn 8 days total (d/c 4/29), d/c vanco 4/27  Hyperglycemia  - resolved  - Sensitive SSI  - Monitor CBG > change to qac + hs  Acute encephalopathy - resolved  Delirium tremens - resolved  Heavy alcohol use (Ethanol 40 on admission)   Consultants:  PCCM transferred care [facility] 4/28  Code Status: Full Family Communication: Pt at bedside. Mother in room Disposition Plan: SNF pending  HPI/Subjective: No events  overnight. No complaints.  Objective: Filed Vitals:   11/02/13 2106 11/03/13 0549 11/03/13 0858 11/03/13 1400  BP: 133/77 152/99    Pulse: 104 99    Temp: 99.9 F (37.7 C) 99.4 F (37.4 C)    TempSrc: Oral Oral    Resp: 18 18    Height:      Weight: 70.67 kg (155 lb 12.8 oz)     SpO2: 94% 100% 100% 99%    Intake/Output Summary (Last 24 hours) at 11/03/13 1425 Last data filed at 11/03/13 0550  Gross per 24 hour  Intake    840 ml  Output   4280 ml   Net  -3440 ml    Exam:   General:  Pt is alert, follows commands appropriately, not in acute distress  Cardiovascular: Regular rhythm, tachycardic, no rubs, no gallops  Respiratory: Course breath sounds bilaterally with crackles at bases   Abdomen: Soft, non tender, non distended, bowel sounds present, no guarding  Extremities: No edema, pulses DP and PT palpable bilaterally  Neuro: Grossly nonfocal  Data Reviewed: Basic Metabolic Panel:  Recent Labs Lab 10/28/13 1700 10/29/13 0445 10/30/13 0440 10/30/13 1700 10/31/13 0400 11/02/13 0736  NA 142 142 145  --  143 148*  K 3.0* 3.4* 3.2*  --  3.0* 3.8  CL 106 107 108  --  106 108  CO2 22 22 23   --  24 24  GLUCOSE 114* 105* 110*  --  90 85  BUN 12 11 9   --  6 7  CREATININE 1.05 0.91 0.78  --  0.84 1.21  CALCIUM 8.3* 8.4 8.8  --  9.1 9.2  MG 1.3* 1.4* 1.9 2.3 1.9  --   PHOS  --   --  2.7 2.4 3.7  --    Liver Function Tests:  Recent Labs Lab 10/28/13 0331 10/29/13 0445 10/30/13 0440  AST 37 29 23  ALT 22 17 16   ALKPHOS 78 66 72  BILITOT 0.3 0.4 0.2*  PROT 6.8 6.6 6.5  ALBUMIN 2.6* 2.4* 2.4*   No results found for this basename: AMMONIA,  in the last 168 hours CBC:  Recent Labs Lab 10/28/13 0331 10/28/13 1700 10/29/13 0445 10/30/13 0440 10/31/13 0400 11/02/13 0736  WBC 8.2 9.8 7.8 6.7 7.8 5.7  NEUTROABS 7.5  --   --   --   --   --   HGB 8.0* 7.3* 7.4* 7.9* 7.4* 7.4*  HCT 24.0* 21.5* 21.9* 23.7* 22.3* 22.7*  MCV 81.9 79.9 80.2 80.3 81.1 82.5  PLT 188 180 188 242 274 399   Cardiac Enzymes: No results found for this basename: CKTOTAL, CKMB, CKMBINDEX, TROPONINI,  in the last 168 hours CBG:  Recent Labs Lab 11/02/13 1142 11/02/13 1726 11/02/13 2104 11/03/13 0735 11/03/13 1127  GLUCAP 100* 109* 103* 84 79    Recent Results (from the past 240 hour(s))  CULTURE, BLOOD (ROUTINE X 2)     Status: None   Collection Time    10/25/13  6:30 PM      Result Value Ref Range Status   Specimen  Description BLOOD RIGHT FOREARM   Final   Special Requests     Final   Value: BOTTLES DRAWN AEROBIC AND ANAEROBIC 10CCBLUE 8CCRED   Culture  Setup Time     Final   Value: 10/26/2013 00:21     Performed at [facility]   Culture     Final   Value: NO GROWTH 5 DAYS  Performed at [facility]   Report Status 11/01/2013 FINAL   Final  CULTURE, BLOOD (ROUTINE X 2)     Status: None   Collection Time    10/25/13  7:24 PM      Result Value Ref Range Status   Specimen Description BLOOD RIGHT HAND   Final   Special Requests BOTTLES DRAWN AEROBIC ONLY 5 CC   Final   Culture  Setup Time     Final   Value: 10/26/2013 03:58     Performed at [facility]   Culture     Final   Value: NO GROWTH 5 DAYS     Performed at [facility]   Report Status 11/01/2013 FINAL   Final  URINE CULTURE     Status: None   Collection Time    10/25/13  7:39 PM      Result Value Ref Range Status   Specimen Description URINE, CLEAN CATCH   Final   Special Requests NONE   Final   Culture  Setup Time     Final   Value: 10/26/2013 00:27     Performed at [place]     Final   Value: >=100,000 COLONIES/ML     Performed at [facility]   Culture     Final   Value: Multiple bacterial morphotypes present, none predominant. Suggest appropriate recollection if clinically indicated.     Performed at [facility]   Report Status 10/27/2013 FINAL   Final  CULTURE, EXPECTORATED SPUTUM-ASSESSMENT     Status: None   Collection Time    10/25/13  8:00 PM      Result Value Ref Range Status   Specimen Description SPUTUM   Final   Special Requests Normal   Final   Sputum evaluation     Final   Value: THIS SPECIMEN IS ACCEPTABLE. RESPIRATORY CULTURE REPORT TO FOLLOW.   Report Status 10/25/2013 FINAL   Final  CULTURE, RESPIRATORY (NON-EXPECTORATED)     Status: None   Collection Time    10/25/13  8:00 PM      Result Value Ref Range Status   Specimen  Description SPUTUM   Final   Special Requests NONE   Final   Gram Stain     Final   Value: ABUNDANT WBC PRESENT,BOTH PMN AND MONONUCLEAR     NO SQUAMOUS EPITHELIAL CELLS SEEN     ABUNDANT GRAM NEGATIVE RODS     FEW GRAM POSITIVE COCCI IN PAIRS     Performed at [facility]   Culture     Final   Value: MODERATE HAEMOPHILUS INFLUENZAE     Note: BETA LACTAMASE POSITIVE     Performed at [facility]   Report Status 10/27/2013 FINAL   Final  MRSA PCR SCREENING     Status: None   Collection Time    10/25/13 11:44 PM      Result Value Ref Range Status   MRSA by PCR NEGATIVE  NEGATIVE Final   Comment:            The GeneXpert MRSA Assay (FDA     approved for NASAL specimens     only), is one component of a     comprehensive MRSA colonization     surveillance program. It is not     intended to diagnose MRSA     infection nor to guide or     monitor treatment for  MRSA infections.  CLOSTRIDIUM DIFFICILE BY PCR     Status: None   Collection Time    10/26/13 12:46 AM      Result Value Ref Range Status   C difficile by pcr NEGATIVE  NEGATIVE Final  STOOL CULTURE     Status: None   Collection Time    10/26/13 12:47 AM      Result Value Ref Range Status   Specimen Description STOOL   Final   Special Requests NONE   Final   Culture     Final   Value: NO SALMONELLA, SHIGELLA, CAMPYLOBACTER, YERSINIA, OR E.COLI 0157:H7 ISOLATED     Performed at [facility]   Report Status 10/30/2013 FINAL   Final  OVA AND PARASITE EXAMINATION     Status: None   Collection Time    10/26/13 12:47 AM      Result Value Ref Range Status   Specimen Description STOOL   Final   Special Requests NONE   Final   Ova and parasites     Final   Value: NO OVA OR PARASITES SEEN     Performed at [facility]   Report Status 10/27/2013 FINAL   Final  CULTURE, BLOOD (ROUTINE X 2)     Status: None   Collection Time    10/26/13 12:50 PM      Result Value Ref Range Status    Specimen Description BLOOD RIGHT ARM   Final   Special Requests BOTTLES DRAWN AEROBIC ONLY 6CC   Final   Culture  Setup Time     Final   Value: 10/26/2013 16:29     Performed at [facility]   Culture     Final   Value: NO GROWTH 5 DAYS     Performed at [facility]   Report Status 11/01/2013 FINAL   Final  CULTURE, BLOOD (ROUTINE X 2)     Status: None   Collection Time    10/26/13  1:00 PM      Result Value Ref Range Status   Specimen Description BLOOD RIGHT HAND   Final   Special Requests BOTTLES DRAWN AEROBIC ONLY 6CC   Final   Culture  Setup Time     Final   Value: 10/26/2013 16:29     Performed at [facility]   Culture     Final   Value: NO GROWTH 5 DAYS     Performed at [facility]   Report Status 11/01/2013 FINAL   Final  RESPIRATORY VIRUS PANEL     Status: Abnormal   Collection Time    10/27/13  7:52 PM      Result Value Ref Range Status   Source - RVPAN NASOPHARYNGEAL   Final   Respiratory Syncytial Virus A NOT DETECTED   Final   Respiratory Syncytial Virus B NOT DETECTED   Final   Influenza A NOT DETECTED   Final   Influenza B NOT DETECTED   Final   Parainfluenza 1 NOT DETECTED   Final   Parainfluenza 2 NOT DETECTED   Final   Parainfluenza 3 NOT DETECTED   Final   Metapneumovirus DETECTED (*)  Final   Rhinovirus NOT DETECTED   Final   Adenovirus NOT DETECTED   Final   Influenza A H1 NOT DETECTED   Final   Influenza A H3 NOT DETECTED   Final   Comment: (NOTE)           Normal  Reference Range for each Analyte: NOT DETECTED     Testing performed using the Luminex xTAG Respiratory Viral Panel test     kit.     This test was developed and its performance characteristics determined     by [facility]. It has not been cleared or approved by the [place]     Food and Drug Administration. This test is used for clinical purposes.     It should not be regarded as investigational or for research. This     laboratory is certified under  the [place] (CLIA) as qualified to perform high complexity     clinical laboratory testing.     Performed at [place], RESPIRATORY (NON-EXPECTORATED)     Status: None   Collection Time    10/28/13  4:49 PM      Result Value Ref Range Status   Specimen Description ENDOTRACHEAL   Final   Special Requests NONE   Final   Gram Stain     Final   Value: FEW WBC PRESENT,BOTH PMN AND MONONUCLEAR     NO SQUAMOUS EPITHELIAL CELLS SEEN     NO ORGANISMS SEEN     Performed at [facility]   Culture     Final   Value: Non-Pathogenic Oropharyngeal-type Flora Isolated.     Performed at [facility]   Report Status 10/31/2013 FINAL   Final  AFB CULTURE WITH SMEAR     Status: None   Collection Time    10/29/13 11:06 AM      Result Value Ref Range Status   Specimen Description BRONCHIAL WASHINGS   Final   Special Requests NONE   Final   Acid Fast Smear     Final   Value: NO ACID FAST BACILLI SEEN     Performed at [facility]   Culture     Final   Value: CULTURE WILL BE EXAMINED FOR 6 WEEKS BEFORE ISSUING A FINAL REPORT     Performed at [facility]   Report Status PENDING   Incomplete  CULTURE, BAL-QUANTITATIVE     Status: None   Collection Time    10/29/13 11:06 AM      Result Value Ref Range Status   Specimen Description BRONCHIAL ALVEOLAR LAVAGE   Final   Special Requests NONE   Final   Gram Stain     Final   Value: NO WBC SEEN     NO SQUAMOUS EPITHELIAL CELLS SEEN     NO ORGANISMS SEEN     Performed at [facility]     Final   Value: NO GROWTH     Performed at [facility]   Culture     Final   Value: NO GROWTH 2 DAYS     Performed at [facility]   Report Status 11/01/2013 FINAL   Final  FUNGUS CULTURE W SMEAR     Status: None   Collection Time    10/29/13 11:06 AM      Result Value Ref Range Status   Specimen Description BRONCHIAL WASHINGS    Final   Special Requests NONE   Final   Fungal Smear     Final   Value: NO YEAST OR FUNGAL ELEMENTS SEEN     Performed at [facility]   Culture     Final   Value: CULTURE IN  PROGRESS FOR FOUR WEEKS     Performed at [facility]   Report Status PENDING   Incomplete     Scheduled Meds: . brimonidine  1 drop Both Eyes BID  . folic acid  1 mg Oral Daily  . insulin aspart  0-9 Units Subcutaneous TID WC  . ipratropium-albuterol  3 mL Nebulization Q6H  . pantoprazole  40 mg Oral BID  . ZOSYN  IV  3.375 g Intravenous Q8H  . thiamine  100 mg Oral Daily   Continuous Infusions:  Donne Hazel, MD  [facility] Pager [phone]  If 7PM-7AM, please contact night-coverage [email or web address] Password [email or web address] 11/03/2013, 2:25 PM   LOS: 9 days

## 2013-11-03 NOTE — Progress Notes (Signed)
PT PROGRESS NOTE  Pt. Progressing in his tolerance of longer distance ambulation, however is not yet steady enough for ambulation without device unless with PT or nursing staff, due to his instability.  He dropped his sats to mid 80s  (86%) without O2 while walking and was 2/4 dyspnic.  O2 reapplied once pt. Back in his room with sats back to 93%.    Pt. Denies pain.    11/03/13 0903  PT Visit Information  Last PT Received On 11/03/13  Assistance Needed +1  History of Present Illness Peter [name] is a 63 y.o. male admitted and found to have PNA sepsis with intubation. Pt with history of chronic alcoholism, cirrhosis of liver, ongoing tobacco abuse, COPD, hyperlipidemia, hypertension was brought to the ER after patient was found to be increasingly weak by patient's family. Pt intubated and extubated due to respiratory failure. 4/25 Bronchoscopy  PT Time Calculation  PT Start Time 0903  PT Stop Time 0933  PT Time Calculation (min) 30 min  Subjective Data  Subjective Pt. reports he wants to walk so "I can get my head straightened out"  Precautions  Precautions Fall  Precaution Comments watch HR and O2  Restrictions  Weight Bearing Restrictions No  Cognition  Arousal/Alertness Awake/alert  Behavior During Therapy WFL for tasks assessed/performed  Overall Cognitive Status Within Functional Limits for tasks assessed  Bed Mobility  Overal bed mobility Modified Independent  Transfers  Overall transfer level Needs assistance  Equipment used Rolling walker (2 wheeled)  Transfers Sit to/from Stand  Sit to Stand Min guard  General transfer comment cues for safe hand placement  Ambulation/Gait  Ambulation/Gait assistance Min guard  Ambulation Distance (Feet) 400 Feet  Assistive device 1 person hand held assist  Gait Pattern/deviations Step-through pattern  Gait velocity decreased  Gait velocity interpretation Below normal speed for age/gender  General Gait Details Gait trial without RW  today.  Pt. found to be somewhat unsteady without device and with an increased fall risk when walking without device  PT - End of Session  Equipment Utilized During Treatment Gait belt  Activity Tolerance Patient tolerated treatment well  Patient left in chair;with call bell/phone within reach;with family/visitor present  Nurse Communication Mobility status  PT - Assessment/Plan  PT Plan Current plan remains appropriate  PT Frequency Min 3X/week  Follow Up Recommendations Home health PT;Supervision/Assistance - 24 hour (insurance denial for CIR, pt. refusing SNF)  PT equipment Rolling walker with 5" wheels (unless pt. already has one)  PT Goal Progression  Progress towards PT goals Progressing toward goals  PT General Charges  $$ ACUTE PT VISIT 1 Procedure  PT Treatments  $Gait Training 23-37 mins    Gerlean Ren PT Acute Rehab Services [phone] Bee[facility] [phone]

## 2013-11-03 NOTE — Progress Notes (Signed)
Occupational Therapy Treatment Patient Details Name: Peter [name] MRN: [id number] DOB: September 22, 1950 Today's Date: 11/03/2013    History of present illness Peter [name] is a 63 y.o. male admitted and found to have PNA sepsis with intubation. Pt with history of chronic alcoholism, cirrhosis of liver, ongoing tobacco abuse, COPD, hyperlipidemia, hypertension was brought to the ER after patient was found to be increasingly weak by patient's family. Pt intubated and extubated due to respiratory failure. 4/25 Bronchoscopy   OT comments  Pt is progressing with all goals.    Follow Up Recommendations  SNF;Supervision/Assistance - 24 hour    Equipment Recommendations       Recommendations for Other Services      Precautions / Restrictions Precautions Precautions: Fall Precaution Comments: watch HR and O2 Restrictions Weight Bearing Restrictions: No       Mobility Bed Mobility                  Transfers Overall transfer level: Needs assistance Equipment used: Rolling walker (2 wheeled) Transfers: Sit to/from Stand Sit to Stand: Supervision         General transfer comment: Cues for hand placement and safety    Balance Overall balance assessment: Needs assistance         Standing balance support: Bilateral upper extremity supported;During functional activity Standing balance-Leahy Scale: Good Standing balance comment: Pt was able to stand and reach to floor to pick up pants and pull them up w/o a walker.                   ADL Overall ADL's : Needs assistance/impaired     Grooming: Wash/dry hands;Wash/dry face;Oral care;Supervision/safety;Standing Grooming Details (indicate cue type and reason): Pt stood at sink for appx 6 min grooming with no LOB         Upper Body Dressing : Set up;Sitting   Lower Body Dressing: Supervision/safety;Sit to/from stand Lower Body Dressing Details (indicate cue type and reason): Pt able to access shoes and socks by  crossing legs.  No AE needs. Toilet Transfer: Supervision/safety;Grab bars;Comfort height toilet;Ambulation Toilet Transfer Details (indicate cue type and reason): cues for safety with hand placement when sitting and standing. Toileting- Clothing Manipulation and Hygiene: Supervision/safety;Sit to/from stand Toileting - Clothing Manipulation Details (indicate cue type and reason): clothes dropped to floor and pt able to pull them up reaching to floor to get them with s.     Functional mobility during ADLs: Supervision/safety;Rolling walker General ADL Comments: Pt needs cues for pursed lip breathing when O2 is off. Pts O2 sat level dropped to 88% w/o O2 while doing adls in the room.      Vision Eye Alignment: Within Functional Limits     Tracking/Visual Pursuits: Able to track stimulus in all quads without difficulty         Additional Comments: Pt with glaucoma and cataracts.  Pt does not drive due to poor vision.   Perception     Praxis      Cognition   Behavior During Therapy: WFL for tasks assessed/performed Overall Cognitive Status: Within Functional Limits for tasks assessed                       Extremity/Trunk Assessment               Exercises     Shoulder Instructions       General Comments      Pertinent Vitals/ Pain  Pt cont to have difficulty keeping O2 sats up when doing adls.  Pt to 88% o                                                                              On RA when doing adls.  Recovered to 94% w O2 replaced.  No pain reported.  Home Living                                          Prior Functioning/Environment              Frequency Min 2X/week     Progress Toward Goals  OT Goals(current goals can now be found in the care plan section)  Progress towards OT goals: Progressing toward goals  Acute Rehab OT Goals Patient Stated Goal: Return home OT Goal Formulation: With patient Time For Goal  Achievement: 11/14/13 Potential to Achieve Goals: Good ADL Goals Pt Will Perform Grooming: with supervision;standing Pt Will Perform Upper Body Bathing: with supervision;sitting Pt Will Perform Lower Body Bathing: with min assist;sit to/from stand Pt Will Perform Upper Body Dressing: with supervision;sitting Pt Will Perform Lower Body Dressing: with min assist;sit to/from stand Pt Will Transfer to Toilet: with min guard assist;bedside commode Pt Will Perform Tub/Shower Transfer: with min assist;tub bench;rolling walker Additional ADL Goal #1: Pt will complete bed mobility supervision level with HOB < 20 degrees  Plan Discharge plan needs to be updated    Co-evaluation                 End of Session Equipment Utilized During Treatment: Rolling walker;Oxygen   Activity Tolerance Patient tolerated treatment well   Patient Left in chair;with call bell/phone within reach;with family/visitor present   Nurse Communication Mobility status;Precautions        Time: 7048-8891 OT Time Calculation (min): 24 min  Charges: OT General Charges $OT Visit: 1 Procedure OT Treatments $Self Care/Home Management : 23-37 mins  Peter [name] 11/03/2013, 11:02 AM [phone]

## 2013-11-04 LAB — GLUCOSE, CAPILLARY
Glucose-Capillary: 91 mg/dL (ref 70–99)
Glucose-Capillary: 73 mg/dL (ref 70–99)

## 2013-11-04 NOTE — Care Management Note (Signed)
CARE MANAGEMENT NOTE 11/04/2013  Patient:  Peter [name], Peter [name]   Account Number:  [id number]  Date Initiated:  10/27/2013  Documentation initiated by:  Marvetta Gibbons  Subjective/Objective Assessment:   Pt admitted with sepsis     Action/Plan:   PTA pt lived at home with caregiver-- needs PT/OT evals   Anticipated DC Date:  10/31/2013   Anticipated DC Plan:  SKILLED NURSING FACILITY  In-house referral  Clinical Social Worker      DC Planning Services  CM consult      Choice offered to / List presented to:             Status of service:  Completed, signed off Medicare Important Message given?  YES (If response is "NO", the following Medicare IM given date fields will be blank) Date Medicare IM given:  11/04/2013 Date Additional Medicare IM given:    Discharge Disposition:  [place]  Per UR Regulation:  Reviewed for med. necessity/level of care/duration of stay  If discussed at [place] of Stay Meetings, dates discussed:   11/01/2013    Comments:  Contacts- Ileene Rubens- [phone],  Wilnette Kales - mother- [phone] or [phone]  10-31-13 8:15am Luz Lex, RNBSN [phone] Extubated on 10-30-13 - still desating on 5-6 liters [place]. Awaiting PT/OT - prior to intubation plan was for SNF.  10-29-13 3:20pm Luz Lex, RNBSN - [phone] Resp failure - failed bipap - tx to ICU and intubated.  10/27/13- 1400- Marvetta Gibbons RN, BSN [phone] In to see pt (caregiver- Vaughan Basta) at bedside- per conversation pt was approved for PCS (80 hrs/wk) and Vaughan Basta was providing this service to pt 3-4 hrs/day- but pt has not met his Medicaid deductible so PCS services were suspended until deductible met. Pt also has [facility] per Vaughan Basta they are working with pt's SW at [facility] [employer] regarding this matter- explained to pt and Vaughan Basta that his [employer] does not cover for 24/7 care but would cover [facility] services for PT/OT ect. - discussed that pt may need ST-SNF stay for rehab vs home with [facility]- will  ask MD to order PT/OT evals- pt understands that ST-SNF would be about 2wks for rehab and that he would need to be agreeable to this in order for [place] to look into this option. Spoke with pt's mother [facility] over phone while in room regarding above she feels pt would benefit from a ST-SNF stay and plans to speak to pt regarding this. NCM to cont. to follow for PT/OT recommendations.

## 2013-11-04 NOTE — Progress Notes (Signed)
NUTRITION FOLLOW UP  Intervention:   Continue Dys 3 diet with thin liquids. Pt declined oral nutrition supplements, encouraged continued adequate intake. RD to continue to follow nutrition plan   Nutrition Dx:   No new nutrition diagnosis at this time.    Goal:   Intake to meet > 90% of estimated nutrition needs.   Monitor:   PO intake, weight trends, labs   Assessment:   PMHx significant for heavy ETOH abuse, COPD, liver cirrhosis. Admitted with HCAP, sepsis and melanotic stools. Developed respiratory distress and DT's on 4/24 and was transferred to ICU and intubated.  Pt extubated 4/26.   Pt reports good appetite and tolerating Dys 3 diet well. Pt is eating 75-100% of meals. Pt states that he is leaving today and doesn't want any oral nutrition supplements, appreciative of RD visit.  Height: Ht Readings from Last 1 Encounters:  10/25/13 5' 7.5" (1.715 m)    Weight Status:   Wt Readings from Last 1 Encounters:  11/03/13 154 lb 14.4 oz (70.262 kg)  Admit wt 154 lb  Re-estimated needs:  Kcal: 1900 - 2100 Protein: 100 - 115 g Fluid: 2 L/ day  Skin: no wounds   Diet Order: Dysphagia 3 with thin liquids   Intake/Output Summary (Last 24 hours) at 11/04/13 1118 Last data filed at 11/04/13 0935  Gross per 24 hour  Intake   1080 ml  Output   2250 ml  Net  -1170 ml    Last BM: 4/29   Labs:   Recent Labs Lab 10/30/13 0440 10/30/13 1700 10/31/13 0400 11/02/13 0736  NA 145  --  143 148*  K 3.2*  --  3.0* 3.8  CL 108  --  106 108  CO2 23  --  24 24  BUN 9  --  6 7  CREATININE 0.78  --  0.84 1.21  CALCIUM 8.8  --  9.1 9.2  MG 1.9 2.3 1.9  --   PHOS 2.7 2.4 3.7  --   GLUCOSE 110*  --  90 85    CBG (last 3)   Recent Labs  11/03/13 1632 11/03/13 2144 11/04/13 0814  GLUCAP 102* 108* 91    Scheduled Meds: . brimonidine  1 drop Both Eyes BID  . folic acid  1 mg Oral Daily  . furosemide  20 mg Intravenous Daily  . insulin aspart  0-9 Units  Subcutaneous TID WC  . ipratropium-albuterol  3 mL Nebulization TID  . pantoprazole  40 mg Oral BID  . thiamine  100 mg Oral Daily    Continuous Infusions:    Inda Coke MS, RD, LDN Inpatient Registered Dietitian Pager: [phone] After-hours pager: [phone]

## 2013-11-04 NOTE — Discharge Summary (Addendum)
Physician Discharge Summary  Peter [name] TFT:[id number] DOB: Feb 24, 1951 DOA: 10/25/2013  PCP: No primary provider on file.  Admit date: 10/25/2013 Discharge date: 11/04/2013  Time spent: 35 minutes  Recommendations for Outpatient Follow-up:  1. Follow up with PCP in 1-2 weeks after discharge  Discharge Diagnoses:  Active Problems:   COPD (chronic obstructive pulmonary disease)   Hyponatremia   Alcohol intoxication   History of stroke   Hypokalemia   Liver cirrhosis, alcoholic   Depression (emotion)   HCAP (healthcare-associated pneumonia)   Systolic CHF   Tobacco abuse   Acute respiratory failure   Delirium tremens   Acute respiratory distress syndrome (ARDS)   Discharge Condition: Improved  Diet recommendation: Dysphagia 3 with thin liquids  Filed Weights   11/01/13 2147 11/02/13 2106 11/03/13 2147  Weight: 71.804 kg (158 lb 4.8 oz) 70.67 kg (155 lb 12.8 oz) 70.262 kg (154 lb 14.4 oz)    History of present illness:  HCAP, sepsis, melanotic stools  History of Present Illness:This is a 63 y.o. year old male with noted prior history of heavy alcohol abuse, COPD, liver cirrhosis, presenting with healthcare associated pneumonia, sepsis, melanotic stools. The patient was discharged from the hospital back in January for weakness in the setting of alcohol intoxication and dehydration/malnutrition. Per the family, patient had 24-hour care at home. However, patient is on begin this kind of care for about the past month. Family states the patient has been drinking beer "all day every day ". Patient is a progressive cough for the past 3-4 days as well as no fever and sputum production. Family also stated the patient has had several loose bowel movements well today. They have been black and tarry as well as foul-smelling. Has baseline increased BMs. This has been worsening per family. Pt denies any abd pain. Has had CP assd with coughing as well as chronic LBP.  On presentation to ER. Tmax  103, HR 130s-150s in setting of dehydration. SBPs 120s-180s. SaO2 intially in low 80s, however has been in mid 90s both on and off supplemental O2. Code sepsis called. WBC 13.4. Na 123, bicarb 13 with AG 23. ETOH level @ 40. Lactate initally at 3.5 then 2 s/p 1L NS bolus. CXR with RML PNA. UA indicative of infection. Pt started on vanc and cefepime for HCAP coverage.   Hospital Course:  Acute respiratory failure requiring ventilatory support (pt with history of COPD and aspiration PNA, tobacco abuse)  - s/p bronchoscopy on 4/25 with removal of mucous plugging. Extubated on 4/26. Currently 100% on 5L O2  - Metapneumovirus and Haemophilus Influenza Positive with RML collapse  - Supplemental O2 as needed to keep SpO2 > 92% - remains on 3Lnc  - d/c'd zosyn 4/29, d/c'd vanco 4/27  - Continued duoNeb Q 6 hr  - bronchial washing studies neg for afb  - stopped IVF and started Lasix  - continue IS, chest PT, flutter valve given partial RML collapse   Systolic CHF (EF 45 to 70% on 2D echo 01/14/13)  - no events on tele  - CVP monitoring with goal 10-12 > d/c CVP's  - place on Lasix 20 mg IV QD, order 2 D ECHO, strict I's and O's, daily weights  - weight today is 70.2kg on discharge Electrolyte disturbance  - Hypokalemia - continue to supplement as indicated - Hypomagnesmia - resolved  - Hyponatremia - resolved  Melena  - most likely due to gastritis in setting of heavy ethanol use (last EGD 05/19/13)  Protein calorie malnutrition  - Protonix BID  - SLP consult pending  - Advance diet as tolerated  - Folic acid and thiamine daily  Microcytic/Normocytic Anemia  - PPI BID  - SCD for DVT prevention  Haemophilus influenza PNA (beta lactamase positive) and Metapneumovirus  - Possible aspiration PNA in setting of heavy alcohol use  - Positive UA with cultures with (mult orgs >100K)  - Severe sepsis - resolved  - Continue zosyn 8 days total (d/c 4/29), d/c vanco 4/27  Hyperglycemia  - resolved  -  Sensitive SSI  Acute encephalopathy - resolved  Delirium tremens - resolved  Heavy alcohol use (Ethanol 40 on admission)   SIGNIFICANT EVENTS:  4/21 Admitted to [facility]  4/24 VDRF. DT's  4/25 Bronchoscopy revealed outake of the RML was very small and tight, completely obstructed with thick secretions that were all removed.  4/26 Extubated successfully  4/27 CXR Pulmonary vascular congestion  LINES / TUBES:  ETT 4/24 >>4/26  CVL 4/24 >>  CULTURES:  MRSA 4/21 NEG  Sputum 4/21 NEG  Strep Pneum/Legionella 4/21 NEG  Stool culture 4/22>>>  Resp viral panel 4/23>>>Metapneumovirus  Ova and parasite 4/22 NEG  Sputum culture 4/21: MODERATE HAEMOPHILUS INFLUENZAE (BETA LACTAMASE POSITIVE)  BCX2 4/22>>>  BCX2 4/21>>>  Cdiff 4/21 NEG  GI pathogen 4/23 NEG  Urine 4/21 >100K multiple bacterial morphotypes  Bronchial Gram Stain 4/25 >>>  Bronchial AFB 4/25 >>>  Bronchial Fungal 4/25 >>>  Bronchial Culture 4/25 >>>  ANTIBIOTICS:  Cefepime 4/21>>>4/22  Zosyn 4/22>>> 4/29  Vanc 4/22>>> 4/27  Consultations: PCCM transferred [facility] 4/28   Discharge Exam: Filed Vitals:   11/03/13 2147 11/04/13 0544 11/04/13 0901 11/04/13 1014  BP: 131/73 153/91  132/58  Pulse: 104 94  102  Temp: 100.3 F (37.9 C) 100.3 F (37.9 C)  98.7 F (37.1 C)  TempSrc: Oral Oral  Oral  Resp: _0 Height:      Weight: 70.262 kg (154 lb 14.4 oz)     SpO2: 93% 90% 90% 99%    General: Awake, in nad Cardiovascular: regular, s1, s2 Respiratory: normal resp effort, no wheezing  Discharge Instructions     Medication List         acamprosate 333 MG tablet  Commonly known as:  CAMPRAL  Take 666 mg by mouth 3 (three) times daily with meals.     albuterol 108 (90 BASE) MCG/ACT inhaler  Commonly known as:  PROVENTIL HFA;VENTOLIN HFA  Inhale 2 puffs into the lungs 4 (four) times daily.     atorvastatin 80 MG tablet  Commonly known as:  LIPITOR  Take 1 tablet (80 mg total) by mouth daily.      brimonidine 0.2 % ophthalmic solution  Commonly known as:  ALPHAGAN  Place 1 drop into both eyes 2 (two) times daily.     budesonide-formoterol 80-4.5 MCG/ACT inhaler  Commonly known as:  SYMBICORT  Inhale 2 puffs into the lungs 2 (two) times daily.     clotrimazole 1 % cream  Commonly known as:  LOTRIMIN  Apply topically 2 (two) times daily.     ferrous sulfate 325 (65 FE) MG tablet  Take 1 tablet (325 mg total) by mouth daily with breakfast. For low iron.     folic acid 1 MG tablet  Commonly known as:  FOLVITE  Take 1 mg by mouth daily.     HYDROcodone-acetaminophen 5-325 MG per tablet  Commonly known as:  NORCO/VICODIN  Take  1 tablet by mouth every 6 (six) hours as needed for moderate pain.     hydrOXYzine 25 MG tablet  Commonly known as:  ATARAX/VISTARIL  Take 25 mg by mouth 3 (three) times daily as needed for anxiety.     multivitamin tablet  Take 1 tablet by mouth daily.     pantoprazole 40 MG tablet  Commonly known as:  PROTONIX  Take 40 mg by mouth daily.     propranolol 10 MG tablet  Commonly known as:  INDERAL  Take 1 tablet (10 mg total) by mouth 3 (three) times daily.     thiamine 100 MG tablet  Commonly known as:  VITAMIN B-1  Take 1 tablet (100 mg total) by mouth daily. For vitamin B deficiency.     traZODone 50 MG tablet  Commonly known as:  DESYREL  Take 1 tablet (50 mg total) by mouth at bedtime.     vitamin B-12 100 MCG tablet  Commonly known as:  CYANOCOBALAMIN  Take 1 tablet (100 mcg total) by mouth daily. For low B12.     vitamin E 200 UNIT capsule  Take 1 capsule (200 Units total) by mouth daily.       Allergies  Allergen Reactions  . Poison Ivy Extract [Extract Of Poison Ivy] Rash       Follow-up Information   Schedule an appointment as soon as possible for a visit with Follow up with PCP in 1-2 weeks.       The results of significant diagnostics from this hospitalization (including imaging, microbiology, ancillary and  laboratory) are listed below for reference.    Significant Diagnostic Studies: Dg Chest Port 1 View  10/31/2013   CLINICAL DATA:  Pneumonia, shortness of breath, history hypertension, cirrhosis, emphysema, diabetes, prior stroke  EXAM: PORTABLE CHEST - 1 VIEW  COMPARISON:  Portable exam 0509 hr compared to 10/30/2013  FINDINGS: Interval removal of endotracheal and nasogastric tubes.  RIGHT jugular central venous catheter with tip projecting over mid SVC.  Enlargement of cardiac silhouette with pulmonary vascular congestion.  Perihilar infiltrates compatible pulmonary edema.  Persistent small RIGHT pleural effusion and basilar atelectasis.  Probable dependent small LEFT pleural effusion as well.  No pneumothorax.  IMPRESSION: Enlargement of cardiac silhouette with pulmonary vascular congestion.  Pulmonary edema with bibasilar pleural effusions and RIGHT basilar atelectasis.   Electronically Signed   By: Lavonia Dana M.D.   On: 10/31/2013 07:17   Dg Chest Port 1 View  10/30/2013   CLINICAL DATA:  Endotracheal tube placement.  EXAM: PORTABLE CHEST - 1 VIEW  COMPARISON:  Chest radiograph performed 10/29/2013  FINDINGS: The patient endotracheal tube is seen ending 4-5 cm above the carina. An enteric tube is noted extending below the diaphragm. A right IJ line is noted ending about the distal SVC.  There has been mild interval redistribution of right basilar airspace opacification, which may reflect atelectasis or pneumonia. Patchy left basilar airspace opacity has mildly worsened. No definite pleural effusion or pneumothorax is seen.  The cardiomediastinal silhouette remains normal in size. No acute osseous abnormalities are identified. Deformity is again noted at the distal left clavicle.  IMPRESSION: 1. Endotracheal tube seen ending 4-5 cm above the carina. 2. Mild interval redistribution of right basilar airspace opacification, which may reflect atelectasis or pneumonia. Mild interval worsening of patchy left  basilar airspace opacity. Underlying mild interstitial edema cannot be excluded.   Electronically Signed   By: Garald Balding M.D.   On: 10/30/2013 05:28  Dg Chest Port 1 View  10/29/2013   CLINICAL DATA:  Followup pneumonia  EXAM: PORTABLE CHEST - 1 VIEW  COMPARISON:  Prior chest x-ray 10/28/2013  FINDINGS: The endotracheal tube is 4.1 cm above the carinal. A nasogastric tube has been placed. The tip overlies the gastric fundus. Stable position of right IJ approach central venous catheter. Catheter tip at the superior cavoatrial junction. Progressive right middle lobe atelectasis. Similar appearance of right upper lobe and bilateral basilar patchy airspace opacities. Mild vascular congestion without overt edema. Small right pleural effusion. No pneumothorax. No acute osseous abnormality.  IMPRESSION: 1. Progressed right middle lobe atelectasis, possibly secondary to mucous plugging. 2. Similar appearance of patchy airspace opacity in the right upper lobe and bilateral bases. 3. The tip of the newly placed nasogastric tube projects over the gastric fundus. Other support apparatus in unchanged position. 4. Small right pleural effusion.   Electronically Signed   By: Jacqulynn Cadet M.D.   On: 10/29/2013 07:37   Dg Chest Port 1 View  10/28/2013   CLINICAL DATA:  Line placement.  EXAM: PORTABLE CHEST - 1 VIEW  COMPARISON:  Single view of the chest 10/28/2013 at 8:00 a.m.  FINDINGS: The patient has a new endotracheal tube in place with tip in good position at the level of the clavicular heads. Right IJ approach Swan-Ganz catheter tip projects in the lower superior vena cava. There is no pneumothorax. Bilateral airspace disease, worse on the right, is not markedly changed.  IMPRESSION: ET tube and NG tube project in good position. No pneumothorax or other change.   Electronically Signed   By: Inge Rise M.D.   On: 10/28/2013 16:43   Dg Chest Port 1 View  10/28/2013   CLINICAL DATA:  Worsening hypoxia.  Pneumonia. Alcohol abuse. Smoker.  EXAM: PORTABLE CHEST - 1 VIEW  COMPARISON:  10/25/2013  FINDINGS: Worsening diffuse bilateral airspace disease is seen, suspicious for worsening pulmonary edema. There is persistent right perihilar consolidation which may represent superimposed pneumonia. Heart size is stable and within normal limits.  IMPRESSION: Worsening diffuse bilateral airspace disease, consistent with increased pulmonary edema.  Persistent right perihilar consolidation ; superimposed pneumonia cannot be excluded.   Electronically Signed   By: Earle Gell M.D.   On: 10/28/2013 08:27   Dg Chest Port 1 View  (if Code Sepsis Called)  10/25/2013   CLINICAL DATA:  WEAKNESS  EXAM: PORTABLE CHEST - 1 VIEW  COMPARISON:  DG CHEST 2 VIEW dated 04/06/2013  FINDINGS: The cardiac silhouette is within normal limits. Aorta is tortuous and ectatic. Atherosclerotic calcifications are appreciated within the aorta. Diffuse increased density is appreciated within the medial and lateral segments of the right middle lobe. No further focal regions of consolidation or focal infiltrates. The osseous structures unremarkable.  IMPRESSION: Right middle lobe infiltrate.   Electronically Signed   By: Margaree Mackintosh M.D.   On: 10/25/2013 19:05   Dg Abd Portable 1v  10/28/2013   CLINICAL DATA:  OG tube placement  EXAM: PORTABLE ABDOMEN - 1 VIEW  COMPARISON:  None.  FINDINGS: Enteric tube terminates in the left upper abdomen, incompletely visualized, but likely within the gastric cardia.  Nonspecific bowel gas pattern, with mildly prominent loops of small and large bowel. The appearance favors adynamic ileus over small bowel obstruction.  Degenerative changes of the lumbar spine  IMPRESSION: Enteric tube terminates in the left upper abdomen, incompletely visualized, but likely within the gastric cardia.  Possible adynamic ileus.   Electronically Signed  By: Julian Hy M.D.   On: 10/28/2013 17:47    Microbiology: Recent  Results (from the past 240 hour(s))  CULTURE, BLOOD (ROUTINE X 2)     Status: None   Collection Time    10/25/13  6:30 PM      Result Value Ref Range Status   Specimen Description BLOOD RIGHT FOREARM   Final   Special Requests     Final   Value: BOTTLES DRAWN AEROBIC AND ANAEROBIC 10CCBLUE 8CCRED   Culture  Setup Time     Final   Value: 10/26/2013 00:21     Performed at [facility]   Culture     Final   Value: NO GROWTH 5 DAYS     Performed at [facility]   Report Status 11/01/2013 FINAL   Final  CULTURE, BLOOD (ROUTINE X 2)     Status: None   Collection Time    10/25/13  7:24 PM      Result Value Ref Range Status   Specimen Description BLOOD RIGHT HAND   Final   Special Requests BOTTLES DRAWN AEROBIC ONLY 5 CC   Final   Culture  Setup Time     Final   Value: 10/26/2013 03:58     Performed at [facility]   Culture     Final   Value: NO GROWTH 5 DAYS     Performed at [facility]   Report Status 11/01/2013 FINAL   Final  URINE CULTURE     Status: None   Collection Time    10/25/13  7:39 PM      Result Value Ref Range Status   Specimen Description URINE, CLEAN CATCH   Final   Special Requests NONE   Final   Culture  Setup Time     Final   Value: 10/26/2013 00:27     Performed at [facility]     Final   Value: >=100,000 COLONIES/ML     Performed at [facility]   Culture     Final   Value: Multiple bacterial morphotypes present, none predominant. Suggest appropriate recollection if clinically indicated.     Performed at [facility]   Report Status 10/27/2013 FINAL   Final  CULTURE, EXPECTORATED SPUTUM-ASSESSMENT     Status: None   Collection Time    10/25/13  8:00 PM      Result Value Ref Range Status   Specimen Description SPUTUM   Final   Special Requests Normal   Final   Sputum evaluation     Final   Value: THIS SPECIMEN IS ACCEPTABLE. RESPIRATORY CULTURE REPORT TO FOLLOW.   Report Status  10/25/2013 FINAL   Final  CULTURE, RESPIRATORY (NON-EXPECTORATED)     Status: None   Collection Time    10/25/13  8:00 PM      Result Value Ref Range Status   Specimen Description SPUTUM   Final   Special Requests NONE   Final   Gram Stain     Final   Value: ABUNDANT WBC PRESENT,BOTH PMN AND MONONUCLEAR     NO SQUAMOUS EPITHELIAL CELLS SEEN     ABUNDANT GRAM NEGATIVE RODS     FEW GRAM POSITIVE COCCI IN PAIRS     Performed at [facility]   Culture     Final   Value: MODERATE HAEMOPHILUS INFLUENZAE     Note: BETA LACTAMASE POSITIVE     Performed at  [facility]   Report Status 10/27/2013 FINAL   Final  MRSA PCR SCREENING     Status: None   Collection Time    10/25/13 11:44 PM      Result Value Ref Range Status   MRSA by PCR NEGATIVE  NEGATIVE Final   Comment:            The GeneXpert MRSA Assay (FDA     approved for NASAL specimens     only), is one component of a     comprehensive MRSA colonization     surveillance program. It is not     intended to diagnose MRSA     infection nor to guide or     monitor treatment for     MRSA infections.  CLOSTRIDIUM DIFFICILE BY PCR     Status: None   Collection Time    10/26/13 12:46 AM      Result Value Ref Range Status   C difficile by pcr NEGATIVE  NEGATIVE Final  STOOL CULTURE     Status: None   Collection Time    10/26/13 12:47 AM      Result Value Ref Range Status   Specimen Description STOOL   Final   Special Requests NONE   Final   Culture     Final   Value: NO SALMONELLA, SHIGELLA, CAMPYLOBACTER, YERSINIA, OR E.COLI 0157:H7 ISOLATED     Performed at [facility]   Report Status 10/30/2013 FINAL   Final  OVA AND PARASITE EXAMINATION     Status: None   Collection Time    10/26/13 12:47 AM      Result Value Ref Range Status   Specimen Description STOOL   Final   Special Requests NONE   Final   Ova and parasites     Final   Value: NO OVA OR PARASITES SEEN     Performed at [facility]    Report Status 10/27/2013 FINAL   Final  CULTURE, BLOOD (ROUTINE X 2)     Status: None   Collection Time    10/26/13 12:50 PM      Result Value Ref Range Status   Specimen Description BLOOD RIGHT ARM   Final   Special Requests BOTTLES DRAWN AEROBIC ONLY 6CC   Final   Culture  Setup Time     Final   Value: 10/26/2013 16:29     Performed at [facility]   Culture     Final   Value: NO GROWTH 5 DAYS     Performed at [facility]   Report Status 11/01/2013 FINAL   Final  CULTURE, BLOOD (ROUTINE X 2)     Status: None   Collection Time    10/26/13  1:00 PM      Result Value Ref Range Status   Specimen Description BLOOD RIGHT HAND   Final   Special Requests BOTTLES DRAWN AEROBIC ONLY [facility]   Final   Culture  Setup Time     Final   Value: 10/26/2013 16:29     Performed at [facility]   Culture     Final   Value: NO GROWTH 5 DAYS     Performed at [facility]   Report Status 11/01/2013 FINAL   Final  RESPIRATORY VIRUS PANEL     Status: Abnormal   Collection Time    10/27/13  7:52 PM      Result Value Ref Range Status  Source - RVPAN NASOPHARYNGEAL   Final   Respiratory Syncytial Virus A NOT DETECTED   Final   Respiratory Syncytial Virus B NOT DETECTED   Final   Influenza A NOT DETECTED   Final   Influenza B NOT DETECTED   Final   Parainfluenza 1 NOT DETECTED   Final   Parainfluenza 2 NOT DETECTED   Final   Parainfluenza 3 NOT DETECTED   Final   Metapneumovirus DETECTED (*)  Final   Rhinovirus NOT DETECTED   Final   Adenovirus NOT DETECTED   Final   Influenza A H1 NOT DETECTED   Final   Influenza A H3 NOT DETECTED   Final   Comment: (NOTE)           Normal Reference Range for each Analyte: NOT DETECTED     Testing performed using the Luminex xTAG Respiratory Viral Panel test     kit.     This test was developed and its performance characteristics determined     by [facility]. It has not been cleared or approved by the [place]     Food and  Drug Administration. This test is used for clinical purposes.     It should not be regarded as investigational or for research. This     laboratory is certified under the [place] (CLIA) as qualified to perform high complexity     clinical laboratory testing.     Performed at [place], RESPIRATORY (NON-EXPECTORATED)     Status: None   Collection Time    10/28/13  4:49 PM      Result Value Ref Range Status   Specimen Description ENDOTRACHEAL   Final   Special Requests NONE   Final   Gram Stain     Final   Value: FEW WBC PRESENT,BOTH PMN AND MONONUCLEAR     NO SQUAMOUS EPITHELIAL CELLS SEEN     NO ORGANISMS SEEN     Performed at [facility]   Culture     Final   Value: Non-Pathogenic Oropharyngeal-type Flora Isolated.     Performed at [facility]   Report Status 10/31/2013 FINAL   Final  AFB CULTURE WITH SMEAR     Status: None   Collection Time    10/29/13 11:06 AM      Result Value Ref Range Status   Specimen Description BRONCHIAL WASHINGS   Final   Special Requests NONE   Final   Acid Fast Smear     Final   Value: NO ACID FAST BACILLI SEEN     Performed at [facility]   Culture     Final   Value: CULTURE WILL BE EXAMINED FOR 6 WEEKS BEFORE ISSUING A FINAL REPORT     Performed at [facility]   Report Status PENDING   Incomplete  CULTURE, BAL-QUANTITATIVE     Status: None   Collection Time    10/29/13 11:06 AM      Result Value Ref Range Status   Specimen Description BRONCHIAL ALVEOLAR LAVAGE   Final   Special Requests NONE   Final   Gram Stain     Final   Value: NO WBC SEEN     NO SQUAMOUS EPITHELIAL CELLS SEEN     NO ORGANISMS SEEN     Performed at [place]     Final   Value: NO  GROWTH     Performed at [facility]   Culture     Final   Value: NO GROWTH 2 DAYS     Performed at [facility]   Report Status 11/01/2013 FINAL    Final  FUNGUS CULTURE W SMEAR     Status: None   Collection Time    10/29/13 11:06 AM      Result Value Ref Range Status   Specimen Description BRONCHIAL WASHINGS   Final   Special Requests NONE   Final   Fungal Smear     Final   Value: NO YEAST OR FUNGAL ELEMENTS SEEN     Performed at [facility]   Culture     Final   Value: CULTURE IN PROGRESS FOR FOUR WEEKS     Performed at [facility]   Report Status PENDING   Incomplete     Labs: Basic Metabolic Panel:  Recent Labs Lab 10/28/13 1700 10/29/13 0445 10/30/13 0440 10/30/13 1700 10/31/13 0400 11/02/13 0736  NA 142 142 145  --  143 148*  K 3.0* 3.4* 3.2*  --  3.0* 3.8  CL 106 107 108  --  106 108  CO2 _0 --  24 24  GLUCOSE 114* 105* 110*  --  90 85  BUN _1 --  6 7  CREATININE 1.05 0.91 0.78  --  0.84 1.21  CALCIUM 8.3* 8.4 8.8  --  9.1 9.2  MG 1.3* 1.4* 1.9 2.3 1.9  --   PHOS  --   --  2.7 2.4 3.7  --    Liver Function Tests:  Recent Labs Lab 10/29/13 0445 10/30/13 0440  AST 29 23  ALT 17 16  ALKPHOS 66 72  BILITOT 0.4 0.2*  PROT 6.6 6.5  ALBUMIN 2.4* 2.4*   No results found for this basename: LIPASE, AMYLASE,  in the last 168 hours No results found for this basename: AMMONIA,  in the last 168 hours CBC:  Recent Labs Lab 10/28/13 1700 10/29/13 0445 10/30/13 0440 10/31/13 0400 11/02/13 0736  WBC 9.8 7.8 6.7 7.8 5.7  HGB 7.3* 7.4* 7.9* 7.4* 7.4*  HCT 21.5* 21.9* 23.7* 22.3* 22.7*  MCV 79.9 80.2 80.3 81.1 82.5  PLT 180 188 242 274 399   Cardiac Enzymes: No results found for this basename: CKTOTAL, CKMB, CKMBINDEX, TROPONINI,  in the last 168 hours BNP: BNP (last 3 results)  Recent Labs  11/02/13 0736  PROBNP 12677.0*   CBG:  Recent Labs Lab 11/03/13 1127 11/03/13 1632 11/03/13 2144 11/04/13 0814 11/04/13 1205  GLUCAP 79 102* 108* 91 73       Signed:  [facility] 11/04/2013, 12:46 PM

## 2013-11-06 NOTE — Clinical Social Work Psychosocial (Addendum)
Clinical Social Work Department BRIEF PSYCHOSOCIAL ASSESSMENT 11/03/2013  Patient:  Peter [name],Peter [name]     Account Number:  [id number]     Admit date:  10/25/2013  Clinical Social Worker:  ,, LCSWA  Date/Time:  11/03/2013 01:25 PM  Referred by:  Physician  Date Referred:  11/03/2013 Referred for  SNF Placement   Other Referral:   Interview type:  Other - See comment Other interview type:   Patient and his mother    PSYCHOSOCIAL DATA Living Status:  ALONE Admitted from facility:   Level of care:   Primary support name:  Dollie Williams  [phone] Primary support relationship to patient:  PARENT Degree of support available:   Good support    CURRENT CONCERNS Current Concerns  Post-Acute Placement   Other Concerns:    SOCIAL WORK ASSESSMENT / PLAN Patient transferred to 6e with original d/c plan for home with Home health and future PCS services, then recommmended SNF placement. CSW recieve referral on 11/03/13 to assist patient with SNF and after meeting with patient - he declinded to do this.  "I'm going home and my mother will help me"  CSW was notfieid later in the day per MD that patient now agrees to SNF placement. CSW met with patient and his mother- discussed SNF bed search process and neither patient nor his mother had a particular preference. Patient stated that he lives near [facility]. Active bed search will be intiated.  Fl2 placed on chart for MD's signature.   Assessment/plan status:  Psychosocial Support/Ongoing Assessment of Needs Other assessment/ plan:   Information/referral to community resources:   SNF ed list provided to patient and his mother for review    PATIENT'S/FAMILY'S RESPONSE TO PLAN OF CARE: Patient is alert and oriented- was originally resistent to SNF placement but is now in agreement to short term stay. Fully intends to return ho.. me when medically stable and strong enough to manage independently.  His mother is  completely supportive of SNF plan and wants patient to choose facility Bed offers will be provided. CSW spoke with Dr. Chiu- plan d/c to SNF tomorrow.         

## 2013-11-06 NOTE — Clinical Social Work Placement (Addendum)
    Clinical Social Work Department CLINICAL SOCIAL WORK PLACEMENT NOTE 11/04/2013  Patient:  Peter [name], Peter [name]  Account Number:  [id number] Admit date:  10/25/2013  Clinical Social Worker:  Butch Penny Nashaun Hillmer, LCSWA  Date/time:  11/03/2013 02:30 PM  Clinical Social Work is seeking post-discharge placement for this patient at the following level of care:   SKILLED NURSING   (*CSW will update this form in Epic as items are completed)   11/03/2013  Patient/family provided with [place] Department of Clinical Social Work's list of facilities offering this level of care within the geographic area requested by the patient (or if unable, by the patient's family).  11/03/2013  Patient/family informed of their freedom to choose among providers that offer the needed level of care, that participate in Medicare, Medicaid or managed care program needed by the patient, have an available bed and are willing to accept the patient.  11/03/2013  Patient/family informed of [facility]' ownership interest in [facility], as well as of the fact that they are under no obligation to receive care at this facility.  PASARR submitted to EDS on 11/03/2013 PASARR number received from EDS on 11/03/2013  FL2 transmitted to all facilities in geographic area requested by pt/family on  11/03/2013 FL2 transmitted to all facilities within larger geographic area on   Patient informed that his/her managed care company has contracts with or will negotiate with  certain facilities, including the following:   NA     Patient/family informed of bed offers received:  11/04/2013 Patient chooses bed at [facility] Physician recommends and patient chooses bed at    Patient to be transferred to [facility] on  11/04/2013 Patient to be transferred to facility by Ambulance  Corey Harold)  The following physician request were entered in Epic:   Additional Comments: 11/04/13  Ok per MD for d/c  today to SNF Patient and his mother are agreable to d/c plan but patient had briefly changed his mind this morning.  He is once again agreeable to d/c plan.  His mother went to facility to sign agmission papers.  Nursing notified to call report. No further CSW needs identified. CSW signing off.

## 2013-11-25 LAB — FUNGUS CULTURE W SMEAR: Fungal Smear: NONE SEEN

## 2013-12-12 LAB — AFB CULTURE WITH SMEAR (NOT AT ARMC): Acid Fast Smear: NONE SEEN

## 2013-12-15 ENCOUNTER — Encounter: Payer: Self-pay | Admitting: Internal Medicine

## 2013-12-15 ENCOUNTER — Ambulatory Visit: Payer: PRIVATE HEALTH INSURANCE | Attending: Internal Medicine | Admitting: Internal Medicine

## 2013-12-15 VITALS — BP 112/76 | HR 77 | Temp 98.9°F | Resp 16 | Wt 139.8 lb

## 2013-12-15 DIAGNOSIS — Z760 Encounter for issue of repeat prescription: Secondary | ICD-10-CM | POA: Diagnosis not present

## 2013-12-15 DIAGNOSIS — Z0000 Encounter for general adult medical examination without abnormal findings: Secondary | ICD-10-CM

## 2013-12-15 DIAGNOSIS — E119 Type 2 diabetes mellitus without complications: Secondary | ICD-10-CM | POA: Insufficient documentation

## 2013-12-15 DIAGNOSIS — J4489 Other specified chronic obstructive pulmonary disease: Secondary | ICD-10-CM | POA: Insufficient documentation

## 2013-12-15 DIAGNOSIS — F1010 Alcohol abuse, uncomplicated: Secondary | ICD-10-CM | POA: Insufficient documentation

## 2013-12-15 DIAGNOSIS — F329 Major depressive disorder, single episode, unspecified: Secondary | ICD-10-CM

## 2013-12-15 DIAGNOSIS — K7030 Alcoholic cirrhosis of liver without ascites: Secondary | ICD-10-CM | POA: Diagnosis not present

## 2013-12-15 DIAGNOSIS — F3289 Other specified depressive episodes: Secondary | ICD-10-CM | POA: Insufficient documentation

## 2013-12-15 DIAGNOSIS — J449 Chronic obstructive pulmonary disease, unspecified: Secondary | ICD-10-CM

## 2013-12-15 DIAGNOSIS — I10 Essential (primary) hypertension: Secondary | ICD-10-CM | POA: Diagnosis not present

## 2013-12-15 DIAGNOSIS — F17200 Nicotine dependence, unspecified, uncomplicated: Secondary | ICD-10-CM | POA: Diagnosis not present

## 2013-12-15 DIAGNOSIS — F32A Depression, unspecified: Secondary | ICD-10-CM

## 2013-12-15 LAB — COMPLETE METABOLIC PANEL WITH GFR
ALT: 23 U/L (ref 0–53)
AST: 45 U/L — ABNORMAL HIGH (ref 0–37)
Albumin: 4.8 g/dL (ref 3.5–5.2)
Alkaline Phosphatase: 82 U/L (ref 39–117)
BUN: 8 mg/dL (ref 6–23)
CALCIUM: 9.3 mg/dL (ref 8.4–10.5)
CHLORIDE: 95 meq/L — AB (ref 96–112)
CO2: 19 mEq/L (ref 19–32)
CREATININE: 0.86 mg/dL (ref 0.50–1.35)
GFR, Est African American: >89 mL/min
GFR, Est Non African American: >89 mL/min
Glucose, Bld: 70 mg/dL (ref 70–99)
Potassium: 4.8 mEq/L (ref 3.5–5.3)
SODIUM: 131 meq/L — AB (ref 135–145)
TOTAL PROTEIN: 8.4 g/dL — AB (ref 6.0–8.3)
Total Bilirubin: 0.5 mg/dL (ref 0.2–1.2)

## 2013-12-15 LAB — GLUCOSE, POCT (MANUAL RESULT ENTRY): POC Glucose: 81 mg/dl (ref 70–99)

## 2013-12-15 LAB — POCT GLYCOSYLATED HEMOGLOBIN (HGB A1C): HEMOGLOBIN A1C: 4.4

## 2013-12-15 MED ORDER — TRAZODONE HCL 50 MG PO TABS: 50.0000 mg | ORAL_TABLET | Freq: Every day | ORAL | Status: DC

## 2013-12-15 MED ORDER — FOLIC ACID 1 MG PO TABS: 1.0000 mg | ORAL_TABLET | Freq: Every day | ORAL | Status: DC

## 2013-12-15 MED ORDER — BUDESONIDE-FORMOTEROL FUMARATE 80-4.5 MCG/ACT IN AERO: 2.0000 | INHALATION_SPRAY | Freq: Two times a day (BID) | RESPIRATORY_TRACT | Status: DC

## 2013-12-15 MED ORDER — CLOTRIMAZOLE 1 % EX CREA: TOPICAL_CREAM | Freq: Two times a day (BID) | CUTANEOUS | Status: DC

## 2013-12-15 MED ORDER — ACAMPROSATE CALCIUM 333 MG PO TBEC: 666.0000 mg | DELAYED_RELEASE_TABLET | Freq: Three times a day (TID) | ORAL | Status: DC

## 2013-12-15 MED ORDER — PROPRANOLOL HCL 10 MG PO TABS: 10.0000 mg | ORAL_TABLET | Freq: Three times a day (TID) | ORAL | Status: DC

## 2013-12-15 MED ORDER — VITAMIN B-12 100 MCG PO TABS: 100.0000 ug | ORAL_TABLET | Freq: Every day | ORAL | Status: DC

## 2013-12-15 MED ORDER — ATORVASTATIN CALCIUM 80 MG PO TABS: 80.0000 mg | ORAL_TABLET | Freq: Every day | ORAL | Status: DC

## 2013-12-15 MED ORDER — ALBUTEROL SULFATE HFA 108 (90 BASE) MCG/ACT IN AERS: 2.0000 | INHALATION_SPRAY | Freq: Four times a day (QID) | RESPIRATORY_TRACT | Status: DC

## 2013-12-15 MED ORDER — HYDROXYZINE HCL 25 MG PO TABS: 25.0000 mg | ORAL_TABLET | Freq: Three times a day (TID) | ORAL | Status: DC | PRN

## 2013-12-15 MED ORDER — VITAMIN B-1 100 MG PO TABS: 100.0000 mg | ORAL_TABLET | Freq: Every day | ORAL | Status: DC

## 2013-12-15 MED ORDER — PANTOPRAZOLE SODIUM 40 MG PO TBEC: 40.0000 mg | DELAYED_RELEASE_TABLET | Freq: Every day | ORAL | Status: DC

## 2013-12-15 MED ORDER — BRIMONIDINE TARTRATE 0.2 % OP SOLN: 1.0000 [drp] | Freq: Two times a day (BID) | OPHTHALMIC | Status: DC

## 2013-12-15 MED ORDER — FERROUS SULFATE 325 (65 FE) MG PO TABS: 325.0000 mg | ORAL_TABLET | Freq: Every day | ORAL | Status: DC

## 2013-12-15 MED ORDER — NICOTINE 21 MG/24HR TD PT24: 21.0000 mg | MEDICATED_PATCH | Freq: Every day | TRANSDERMAL | Status: DC

## 2013-12-15 NOTE — Progress Notes (Signed)
MRN: [id number] Name: Peter [name]  Sex: male Age: 63 y.o. DOB: 10-Sep-1950  Allergies: Poison ivy extract  Chief Complaint  Patient presents with  . Follow-up    HPI: Patient is 63 y.o. male who Patient has history of COPD CHF, anemia, history of alcohol abuse, comes today for medication refills, he was recently hospitalized a few months ago with an acute respiratory failure and COPD exacerbation was treated initially with antibiotics which was subsequently discontinued patient had a bronchoscopy done and was extubated. Patient denies any chest pain or shortness of breath but  still smokes cigarettes as well as drink alcohol, have counseled patient to cut down and quit smoking and drinking, patient also has been following up with his gastroenterologist.  Past Medical History  Diagnosis Date  . Hypertension   . Hyperlipemia   . Emphysema   . Stroke   . Chronic pain   . Cirrhosis of liver   . Fall   . Dental injury   . Bone spur of other site     Left Foot  . Impaired mobility   . Total self care deficit   . Pneumonia   . Emphysema   . ETOH abuse   . Elevated LFTs   . Arthritis   . Anxiety   . Sinusitis   . Anemia     iron def.  . Hard of hearing   . Diabetes mellitus without complication   . COPD (chronic obstructive pulmonary disease)   . Depression   . Glaucoma   . Emphysema of lung   . Cataract     Past Surgical History  Procedure Laterality Date  . Colonoscopy  01/06/2012    Procedure: COLONOSCOPY;  Surgeon: Beryle Beams, MD;  Location: [facility] ENDOSCOPY;  Service: Endoscopy;  Laterality: N/A;  . Esophagogastroduodenoscopy  01/06/2012    Procedure: ESOPHAGOGASTRODUODENOSCOPY (EGD);  Surgeon: Beryle Beams, MD;  Location: [facility] ENDOSCOPY;  Service: Endoscopy;  Laterality: N/A;  . Esophagogastroduodenoscopy  01/09/2012    Procedure: ESOPHAGOGASTRODUODENOSCOPY (EGD);  Surgeon: Beryle Beams, MD;  Location: [facility] ENDOSCOPY;  Service: Endoscopy;  Laterality: N/A;  .  Multiple extractions with alveoloplasty  03/29/2012    Procedure: MULTIPLE EXTRACION WITH ALVEOLOPLASTY;  Surgeon: Gae Bon, DDS;  Location: [place];  Service: Oral Surgery;  Laterality: Bilateral;  . Bone spur  2013    left spur  . Cataract extraction w/ intraocular lens implant    . Mouth surgery    . Colonoscopy        Medication List       This list is accurate as of: 12/15/13 10:30 AM.  Always use your most recent med list.               acamprosate 333 MG tablet  Commonly known as:  CAMPRAL  Take 2 tablets (666 mg total) by mouth 3 (three) times daily with meals.     albuterol 108 (90 BASE) MCG/ACT inhaler  Commonly known as:  PROVENTIL HFA;VENTOLIN HFA  Inhale 2 puffs into the lungs 4 (four) times daily.     atorvastatin 80 MG tablet  Commonly known as:  LIPITOR  Take 1 tablet (80 mg total) by mouth daily.     brimonidine 0.2 % ophthalmic solution  Commonly known as:  ALPHAGAN  Place 1 drop into both eyes 2 (two) times daily.     budesonide-formoterol 80-4.5 MCG/ACT inhaler  Commonly known as:  SYMBICORT  Inhale 2 puffs into the lungs  2 (two) times daily.     clotrimazole 1 % cream  Commonly known as:  LOTRIMIN  Apply topically 2 (two) times daily.     ferrous sulfate 325 (65 FE) MG tablet  Take 1 tablet (325 mg total) by mouth daily with breakfast. For low iron.     folic acid 1 MG tablet  Commonly known as:  FOLVITE  Take 1 tablet (1 mg total) by mouth daily.     HYDROcodone-acetaminophen 5-325 MG per tablet  Commonly known as:  NORCO/VICODIN  Take 1 tablet by mouth every 6 (six) hours as needed for moderate pain.     hydrOXYzine 25 MG tablet  Commonly known as:  ATARAX/VISTARIL  Take 1 tablet (25 mg total) by mouth 3 (three) times daily as needed for anxiety.     multivitamin tablet  Take 1 tablet by mouth daily.     nicotine 21 mg/24hr patch  Commonly known as:  NICODERM CQ  Place 1 patch (21 mg total) onto the skin daily.     pantoprazole  40 MG tablet  Commonly known as:  PROTONIX  Take 1 tablet (40 mg total) by mouth daily.     propranolol 10 MG tablet  Commonly known as:  INDERAL  Take 1 tablet (10 mg total) by mouth 3 (three) times daily.     thiamine 100 MG tablet  Commonly known as:  VITAMIN B-1  Take 1 tablet (100 mg total) by mouth daily. For vitamin B deficiency.     traZODone 50 MG tablet  Commonly known as:  DESYREL  Take 1 tablet (50 mg total) by mouth at bedtime.     vitamin B-12 100 MCG tablet  Commonly known as:  CYANOCOBALAMIN  Take 1 tablet (100 mcg total) by mouth daily. For low B12.     vitamin E 200 UNIT capsule  Take 1 capsule (200 Units total) by mouth daily.        Meds ordered this encounter  Medications  . acamprosate (CAMPRAL) 333 MG tablet    Sig: Take 2 tablets (666 mg total) by mouth 3 (three) times daily with meals.    Dispense:  180 tablet    Refill:  2  . albuterol (PROVENTIL HFA;VENTOLIN HFA) 108 (90 BASE) MCG/ACT inhaler    Sig: Inhale 2 puffs into the lungs 4 (four) times daily.    Dispense:  1 Inhaler    Refill:  2  . atorvastatin (LIPITOR) 80 MG tablet    Sig: Take 1 tablet (80 mg total) by mouth daily.    Dispense:  30 tablet    Refill:  2  . brimonidine (ALPHAGAN) 0.2 % ophthalmic solution    Sig: Place 1 drop into both eyes 2 (two) times daily.    Dispense:  5 mL    Refill:  1  . budesonide-formoterol (SYMBICORT) 80-4.5 MCG/ACT inhaler    Sig: Inhale 2 puffs into the lungs 2 (two) times daily.    Dispense:  1 Inhaler    Refill:  2  . clotrimazole (LOTRIMIN) 1 % cream    Sig: Apply topically 2 (two) times daily.    Dispense:  15 g    Refill:  0  . ferrous sulfate 325 (65 FE) MG tablet    Sig: Take 1 tablet (325 mg total) by mouth daily with breakfast. For low iron.    Dispense:  30 tablet    Refill:  2  . folic acid (FOLVITE) 1 MG tablet  Sig: Take 1 tablet (1 mg total) by mouth daily.    Dispense:  30 tablet    Refill:  2  . hydrOXYzine  (ATARAX/VISTARIL) 25 MG tablet    Sig: Take 1 tablet (25 mg total) by mouth 3 (three) times daily as needed for anxiety.    Dispense:  30 tablet    Refill:  0  . pantoprazole (PROTONIX) 40 MG tablet    Sig: Take 1 tablet (40 mg total) by mouth daily.    Dispense:  30 tablet    Refill:  2  . propranolol (INDERAL) 10 MG tablet    Sig: Take 1 tablet (10 mg total) by mouth 3 (three) times daily.    Dispense:  90 tablet    Refill:  3  . thiamine (VITAMIN B-1) 100 MG tablet    Sig: Take 1 tablet (100 mg total) by mouth daily. For vitamin B deficiency.    Dispense:  30 tablet    Refill:  2  . traZODone (DESYREL) 50 MG tablet    Sig: Take 1 tablet (50 mg total) by mouth at bedtime.    Dispense:  30 tablet    Refill:  0    Order Specific Question:  Supervising Provider    Answer:  Carlton Adam A [[id number]]  . vitamin B-12 (CYANOCOBALAMIN) 100 MCG tablet    Sig: Take 1 tablet (100 mcg total) by mouth daily. For low B12.    Dispense:  30 tablet    Refill:  2  . nicotine (NICODERM CQ) 21 mg/24hr patch    Sig: Place 1 patch (21 mg total) onto the skin daily.    Dispense:  28 patch    Refill:  0    Immunization History  Administered Date(s) Administered  . Influenza Split 06/07/2013  . Pneumococcal Polysaccharide-23 01/04/2012    Family History  Problem Relation Age of Onset  . Breast cancer Mother   . Diabetes Maternal Aunt   . Heart disease Sister   . Heart disease Maternal Aunt     History  Substance Use Topics  . Smoking status: Current Every Day Smoker -- 2.00 packs/day for 30 years    Types: Cigarettes  . Smokeless tobacco: Never Used  . Alcohol Use: 2.4 oz/week    4 Cans of beer per week     Comment: per pt four  40oz daily beer    Review of Systems   As noted in HPI  Filed Vitals:   12/15/13 0953  BP: 112/76  Pulse: 77  Temp: 98.9 F (37.2 C)  Resp: 16    Physical Exam  Physical Exam  Constitutional: He is oriented to person, place, and time. No  distress.  Eyes:  Bilateral surgical pupils  Neck: Neck supple.  Cardiovascular: Normal rate and regular rhythm.   Pulmonary/Chest: No respiratory distress. He has no wheezes. He has no rales.  Musculoskeletal: He exhibits no edema.  Neurological: He is alert and oriented to person, place, and time.    CBC    Component Value Date/Time   WBC 5.7 11/02/2013 0736   RBC 2.75* 11/02/2013 0736   RBC 2.75* 10/31/2013 0400   HGB 7.4* 11/02/2013 0736   HCT 22.7* 11/02/2013 0736   PLT 399 11/02/2013 0736   MCV 82.5 11/02/2013 0736   LYMPHSABS 0.4* 10/28/2013 0331   MONOABS 0.3 10/28/2013 0331   EOSABS 0.0 10/28/2013 0331   BASOSABS 0.0 10/28/2013 0331    CMP     Component Value  Date/Time   NA 148* 11/02/2013 0736   K 3.8 11/02/2013 0736   CL 108 11/02/2013 0736   CO2 24 11/02/2013 0736   GLUCOSE 85 11/02/2013 0736   BUN 7 11/02/2013 0736   CREATININE 1.21 11/02/2013 0736   CREATININE 1.02 06/07/2013 1437   CALCIUM 9.2 11/02/2013 0736   PROT 6.5 10/30/2013 0440   ALBUMIN 2.4* 10/30/2013 0440   AST 23 10/30/2013 0440   ALT 16 10/30/2013 0440   ALKPHOS 72 10/30/2013 0440   BILITOT 0.2* 10/30/2013 0440   GFRNONAA 63* 11/02/2013 0736   GFRAA 73* 11/02/2013 0736    No results found for this basename: chol,  tri,  ldl    No components found with this basename: hga1c    Lab Results  Component Value Date/Time   AST 23 10/30/2013  4:40 AM    Assessment and Plan  COPD, moderate - Plan: Patient is on Symbicort and her albuterol when necessary. I have advised patient to quit smoking.  Chronic alcohol abuse/Liver cirrhosis, alcoholic - Plan: Counseled patient about her down drinking atorvastatin (LIPITOR) 80 MG tablet, ferrous sulfate 325 (65 FE) MG tablet, thiamine (VITAMIN B-1) 100 MG tablet, traZODone (DESYREL) 50 MG tablet, vitamin B-12 (CYANOCOBALAMIN) 100 MCG tablet Will check his blood chemistry and LFTs, patient is following up with his gastroenterologist.  Depression (emotion) - Plan:   traZODone (DESYREL) 50 MG tablet,   Preventative health care - Plan:  Results for orders placed in visit on 12/15/13  GLUCOSE, POCT (MANUAL RESULT ENTRY)      Result Value Ref Range   POC Glucose 81  70 - 99 mg/dl  POCT GLYCOSYLATED HEMOGLOBIN (HGB A1C)      Result Value Ref Range   Hemoglobin A1C 4.4     Patient does not have diabetes.  Smoker - Plan: nicotine (NICODERM CQ) 21 mg/24hr patch   Health Maintenance -Colonoscopy: Up-to-date   Return in about 3 months (around 03/17/2014) for COPD, depression.  Lorayne Marek, MD

## 2013-12-15 NOTE — Progress Notes (Signed)
Patient here for follow up from hospital Was admitted with pneumonia Also needs medication refilled

## 2013-12-16 ENCOUNTER — Telehealth: Payer: Self-pay

## 2013-12-16 NOTE — Telephone Encounter (Signed)
Message copied by Dorothe Pea on Fri Dec 16, 2013 10:01 AM ------      Message from: Lorayne Marek      Created: Fri Dec 16, 2013  9:31 AM       Call and let the patient know that his potassium is normal, he has chronic hyponatremia, advise patient to avoid drinking alcohol his liver enzyme is also trending up. ------

## 2013-12-16 NOTE — Telephone Encounter (Signed)
Patient not available Left message on  Voice mail to return our call 

## 2014-02-27 ENCOUNTER — Telehealth: Payer: Self-pay | Admitting: Home Health Services

## 2014-02-27 NOTE — Telephone Encounter (Signed)
Left message to schedule diabetic eye exam

## 2014-05-25 ENCOUNTER — Emergency Department (HOSPITAL_COMMUNITY): Payer: PRIVATE HEALTH INSURANCE

## 2014-05-25 ENCOUNTER — Inpatient Hospital Stay (HOSPITAL_COMMUNITY): Payer: PRIVATE HEALTH INSURANCE

## 2014-05-25 ENCOUNTER — Encounter (HOSPITAL_COMMUNITY): Payer: Self-pay | Admitting: Family Medicine

## 2014-05-25 ENCOUNTER — Inpatient Hospital Stay (HOSPITAL_COMMUNITY)
Admission: EM | Admit: 2014-05-25 | Discharge: 2014-05-31 | DRG: 871 | Disposition: A | Discharge status: Skilled Nursing Facility | Payer: PRIVATE HEALTH INSURANCE | Attending: Internal Medicine | Admitting: Internal Medicine

## 2014-05-25 DIAGNOSIS — R748 Abnormal levels of other serum enzymes: Secondary | ICD-10-CM

## 2014-05-25 DIAGNOSIS — K7460 Unspecified cirrhosis of liver: Secondary | ICD-10-CM | POA: Diagnosis present

## 2014-05-25 DIAGNOSIS — D61818 Other pancytopenia: Secondary | ICD-10-CM | POA: Diagnosis present

## 2014-05-25 DIAGNOSIS — A419 Sepsis, unspecified organism: Secondary | ICD-10-CM | POA: Diagnosis present

## 2014-05-25 DIAGNOSIS — E870 Hyperosmolality and hypernatremia: Secondary | ICD-10-CM | POA: Diagnosis present

## 2014-05-25 DIAGNOSIS — E119 Type 2 diabetes mellitus without complications: Secondary | ICD-10-CM | POA: Diagnosis present

## 2014-05-25 DIAGNOSIS — D508 Other iron deficiency anemias: Secondary | ICD-10-CM | POA: Diagnosis present

## 2014-05-25 DIAGNOSIS — K861 Other chronic pancreatitis: Secondary | ICD-10-CM | POA: Diagnosis present

## 2014-05-25 DIAGNOSIS — F1010 Alcohol abuse, uncomplicated: Secondary | ICD-10-CM | POA: Diagnosis present

## 2014-05-25 DIAGNOSIS — E876 Hypokalemia: Secondary | ICD-10-CM | POA: Diagnosis present

## 2014-05-25 DIAGNOSIS — N179 Acute kidney failure, unspecified: Secondary | ICD-10-CM | POA: Diagnosis present

## 2014-05-25 DIAGNOSIS — F329 Major depressive disorder, single episode, unspecified: Secondary | ICD-10-CM | POA: Diagnosis present

## 2014-05-25 DIAGNOSIS — Z8673 Personal history of transient ischemic attack (TIA), and cerebral infarction without residual deficits: Secondary | ICD-10-CM

## 2014-05-25 DIAGNOSIS — J690 Pneumonitis due to inhalation of food and vomit: Secondary | ICD-10-CM | POA: Diagnosis present

## 2014-05-25 DIAGNOSIS — R410 Disorientation, unspecified: Secondary | ICD-10-CM

## 2014-05-25 DIAGNOSIS — K219 Gastro-esophageal reflux disease without esophagitis: Secondary | ICD-10-CM | POA: Diagnosis present

## 2014-05-25 DIAGNOSIS — I10 Essential (primary) hypertension: Secondary | ICD-10-CM | POA: Diagnosis present

## 2014-05-25 DIAGNOSIS — R627 Adult failure to thrive: Secondary | ICD-10-CM | POA: Diagnosis present

## 2014-05-25 DIAGNOSIS — J449 Chronic obstructive pulmonary disease, unspecified: Secondary | ICD-10-CM | POA: Diagnosis present

## 2014-05-25 DIAGNOSIS — H409 Unspecified glaucoma: Secondary | ICD-10-CM | POA: Diagnosis present

## 2014-05-25 DIAGNOSIS — J9601 Acute respiratory failure with hypoxia: Secondary | ICD-10-CM | POA: Diagnosis present

## 2014-05-25 DIAGNOSIS — F17210 Nicotine dependence, cigarettes, uncomplicated: Secondary | ICD-10-CM | POA: Diagnosis present

## 2014-05-25 DIAGNOSIS — E871 Hypo-osmolality and hyponatremia: Secondary | ICD-10-CM | POA: Diagnosis present

## 2014-05-25 DIAGNOSIS — E440 Moderate protein-calorie malnutrition: Secondary | ICD-10-CM | POA: Diagnosis present

## 2014-05-25 DIAGNOSIS — F10929 Alcohol use, unspecified with intoxication, unspecified: Secondary | ICD-10-CM | POA: Diagnosis present

## 2014-05-25 DIAGNOSIS — K859 Acute pancreatitis, unspecified: Secondary | ICD-10-CM | POA: Diagnosis present

## 2014-05-25 DIAGNOSIS — F10229 Alcohol dependence with intoxication, unspecified: Secondary | ICD-10-CM | POA: Diagnosis present

## 2014-05-25 DIAGNOSIS — R509 Fever, unspecified: Secondary | ICD-10-CM | POA: Diagnosis present

## 2014-05-25 DIAGNOSIS — H9190 Unspecified hearing loss, unspecified ear: Secondary | ICD-10-CM | POA: Diagnosis present

## 2014-05-25 DIAGNOSIS — E860 Dehydration: Secondary | ICD-10-CM | POA: Diagnosis present

## 2014-05-25 DIAGNOSIS — Z9114 Patient's other noncompliance with medication regimen: Secondary | ICD-10-CM | POA: Diagnosis present

## 2014-05-25 DIAGNOSIS — K7030 Alcoholic cirrhosis of liver without ascites: Secondary | ICD-10-CM | POA: Diagnosis present

## 2014-05-25 DIAGNOSIS — E785 Hyperlipidemia, unspecified: Secondary | ICD-10-CM | POA: Diagnosis present

## 2014-05-25 DIAGNOSIS — F419 Anxiety disorder, unspecified: Secondary | ICD-10-CM | POA: Diagnosis present

## 2014-05-25 DIAGNOSIS — Z79899 Other long term (current) drug therapy: Secondary | ICD-10-CM

## 2014-05-25 DIAGNOSIS — Z6822 Body mass index (BMI) 22.0-22.9, adult: Secondary | ICD-10-CM | POA: Diagnosis not present

## 2014-05-25 DIAGNOSIS — R6520 Severe sepsis without septic shock: Secondary | ICD-10-CM | POA: Diagnosis present

## 2014-05-25 DIAGNOSIS — I5043 Acute on chronic combined systolic (congestive) and diastolic (congestive) heart failure: Secondary | ICD-10-CM | POA: Diagnosis present

## 2014-05-25 DIAGNOSIS — R531 Weakness: Secondary | ICD-10-CM | POA: Diagnosis present

## 2014-05-25 DIAGNOSIS — F10129 Alcohol abuse with intoxication, unspecified: Secondary | ICD-10-CM

## 2014-05-25 LAB — RAPID URINE DRUG SCREEN, HOSP PERFORMED
Amphetamines: NOT DETECTED
Barbiturates: NOT DETECTED
Benzodiazepines: NOT DETECTED
COCAINE: NOT DETECTED
OPIATES: NOT DETECTED
Tetrahydrocannabinol: NOT DETECTED

## 2014-05-25 LAB — CBC WITH DIFFERENTIAL/PLATELET
BASOS ABS: 0 10*3/uL (ref 0.0–0.1)
BASOS PCT: 0 % (ref 0–1)
EOS PCT: 0 % (ref 0–5)
Eosinophils Absolute: 0 10*3/uL (ref 0.0–0.7)
HCT: 26.1 % — ABNORMAL LOW (ref 39.0–52.0)
Hemoglobin: 9 g/dL — ABNORMAL LOW (ref 13.0–17.0)
Lymphocytes Relative: 16 % (ref 12–46)
Lymphs Abs: 0.5 10*3/uL — ABNORMAL LOW (ref 0.7–4.0)
MCH: 26.4 pg (ref 26.0–34.0)
MCHC: 34.5 g/dL (ref 30.0–36.0)
MCV: 76.5 fL — AB (ref 78.0–100.0)
Monocytes Absolute: 0.5 10*3/uL (ref 0.1–1.0)
Monocytes Relative: 18 % — ABNORMAL HIGH (ref 3–12)
Neutro Abs: 1.9 10*3/uL (ref 1.7–7.7)
Neutrophils Relative %: 65 % (ref 43–77)
PLATELETS: 100 10*3/uL — AB (ref 150–400)
RBC: 3.41 MIL/uL — ABNORMAL LOW (ref 4.22–5.81)
RDW: 15.4 % (ref 11.5–15.5)
WBC: 2.9 10*3/uL — ABNORMAL LOW (ref 4.0–10.5)

## 2014-05-25 LAB — PROTIME-INR
INR: 1.09 (ref 0.00–1.49)
PROTHROMBIN TIME: 14.3 s (ref 11.6–15.2)

## 2014-05-25 LAB — LACTIC ACID, PLASMA: Lactic Acid, Venous: 1.2 mmol/L (ref 0.5–2.2)

## 2014-05-25 LAB — CBC
HCT: 27.5 % — ABNORMAL LOW (ref 39.0–52.0)
Hemoglobin: 9.5 g/dL — ABNORMAL LOW (ref 13.0–17.0)
MCH: 26.4 pg (ref 26.0–34.0)
MCHC: 34.5 g/dL (ref 30.0–36.0)
MCV: 76.4 fL — ABNORMAL LOW (ref 78.0–100.0)
PLATELETS: 95 10*3/uL — AB (ref 150–400)
RBC: 3.6 MIL/uL — AB (ref 4.22–5.81)
RDW: 15.3 % (ref 11.5–15.5)
WBC: 4.4 10*3/uL (ref 4.0–10.5)

## 2014-05-25 LAB — COMPREHENSIVE METABOLIC PANEL
ALT: 46 U/L (ref 0–53)
AST: 99 U/L — AB (ref 0–37)
Albumin: 4 g/dL (ref 3.5–5.2)
Alkaline Phosphatase: 107 U/L (ref 39–117)
Anion gap: 21 — ABNORMAL HIGH (ref 5–15)
BILIRUBIN TOTAL: 0.4 mg/dL (ref 0.3–1.2)
BUN: 17 mg/dL (ref 6–23)
CHLORIDE: 76 meq/L — AB (ref 96–112)
CO2: 19 meq/L (ref 19–32)
Calcium: 9.6 mg/dL (ref 8.4–10.5)
Creatinine, Ser: 1.26 mg/dL (ref 0.50–1.35)
GFR calc Af Amer: 69 mL/min — ABNORMAL LOW (ref >90)
GFR, EST NON AFRICAN AMERICAN: 60 mL/min — AB (ref >90)
Glucose, Bld: 74 mg/dL (ref 70–99)
Potassium: 4.3 mEq/L (ref 3.7–5.3)
SODIUM: 116 meq/L — AB (ref 137–147)
Total Protein: 8.7 g/dL — ABNORMAL HIGH (ref 6.0–8.3)

## 2014-05-25 LAB — INFLUENZA PANEL BY PCR (TYPE A & B)
H1N1 flu by pcr: NOT DETECTED
INFLAPCR: NEGATIVE
Influenza B By PCR: NEGATIVE

## 2014-05-25 LAB — URINALYSIS, ROUTINE W REFLEX MICROSCOPIC
BILIRUBIN URINE: NEGATIVE
Glucose, UA: NEGATIVE mg/dL
Ketones, ur: 15 mg/dL — AB
Nitrite: NEGATIVE
PH: 7 (ref 5.0–8.0)
Protein, ur: 30 mg/dL — AB
Specific Gravity, Urine: 1.007 (ref 1.005–1.030)
Urobilinogen, UA: 0.2 mg/dL (ref 0.0–1.0)

## 2014-05-25 LAB — I-STAT CHEM 8, ED
BUN: 16 mg/dL (ref 6–23)
CHLORIDE: 84 meq/L — AB (ref 96–112)
Calcium, Ion: 1.06 mmol/L — ABNORMAL LOW (ref 1.13–1.30)
Creatinine, Ser: 1.4 mg/dL — ABNORMAL HIGH (ref 0.50–1.35)
Glucose, Bld: 71 mg/dL (ref 70–99)
HCT: 34 % — ABNORMAL LOW (ref 39.0–52.0)
Hemoglobin: 11.6 g/dL — ABNORMAL LOW (ref 13.0–17.0)
POTASSIUM: 4.2 meq/L (ref 3.7–5.3)
SODIUM: 116 meq/L — AB (ref 137–147)
TCO2: 18 mmol/L (ref 0–100)

## 2014-05-25 LAB — CBG MONITORING, ED: Glucose-Capillary: 74 mg/dL (ref 70–99)

## 2014-05-25 LAB — BASIC METABOLIC PANEL
ANION GAP: 19 — AB (ref 5–15)
Anion gap: 19 — ABNORMAL HIGH (ref 5–15)
Anion gap: 16 — ABNORMAL HIGH (ref 5–15)
Anion gap: 19 — ABNORMAL HIGH (ref 5–15)
BUN: 15 mg/dL (ref 6–23)
BUN: 20 mg/dL (ref 6–23)
BUN: 19 mg/dL (ref 6–23)
BUN: 19 mg/dL (ref 6–23)
CALCIUM: 8.8 mg/dL (ref 8.4–10.5)
CALCIUM: 8.9 mg/dL (ref 8.4–10.5)
CALCIUM: 8.9 mg/dL (ref 8.4–10.5)
CO2: 19 meq/L (ref 19–32)
CO2: 19 meq/L (ref 19–32)
CO2: 20 meq/L (ref 19–32)
CO2: 17 meq/L — AB (ref 19–32)
CREATININE: 1.13 mg/dL (ref 0.50–1.35)
CREATININE: 1.35 mg/dL (ref 0.50–1.35)
Calcium: 9.2 mg/dL (ref 8.4–10.5)
Chloride: 87 mEq/L — ABNORMAL LOW (ref 96–112)
Chloride: 87 mEq/L — ABNORMAL LOW (ref 96–112)
Chloride: 89 mEq/L — ABNORMAL LOW (ref 96–112)
Chloride: 89 mEq/L — ABNORMAL LOW (ref 96–112)
Creatinine, Ser: 1.13 mg/dL (ref 0.50–1.35)
Creatinine, Ser: 1.14 mg/dL (ref 0.50–1.35)
GFR calc Af Amer: 79 mL/min — ABNORMAL LOW (ref >90)
GFR calc Af Amer: 79 mL/min — ABNORMAL LOW (ref >90)
GFR calc Af Amer: 78 mL/min — ABNORMAL LOW (ref >90)
GFR calc Af Amer: 64 mL/min — ABNORMAL LOW (ref >90)
GFR calc non Af Amer: 68 mL/min — ABNORMAL LOW (ref >90)
GFR calc non Af Amer: 68 mL/min — ABNORMAL LOW (ref >90)
GFR calc non Af Amer: 55 mL/min — ABNORMAL LOW (ref >90)
GFR, EST NON AFRICAN AMERICAN: 68 mL/min — AB (ref >90)
GLUCOSE: 98 mg/dL (ref 70–99)
GLUCOSE: 88 mg/dL (ref 70–99)
GLUCOSE: 122 mg/dL — AB (ref 70–99)
Glucose, Bld: 79 mg/dL (ref 70–99)
Potassium: 4 mEq/L (ref 3.7–5.3)
Potassium: 3.7 mEq/L (ref 3.7–5.3)
Potassium: 3.6 mEq/L — ABNORMAL LOW (ref 3.7–5.3)
Potassium: 3.5 mEq/L — ABNORMAL LOW (ref 3.7–5.3)
Sodium: 125 mEq/L — ABNORMAL LOW (ref 137–147)
Sodium: 125 mEq/L — ABNORMAL LOW (ref 137–147)
Sodium: 125 mEq/L — ABNORMAL LOW (ref 137–147)
Sodium: 125 mEq/L — ABNORMAL LOW (ref 137–147)

## 2014-05-25 LAB — I-STAT CG4 LACTIC ACID, ED: Lactic Acid, Venous: 3.74 mmol/L — ABNORMAL HIGH (ref 0.5–2.2)

## 2014-05-25 LAB — URINE MICROSCOPIC-ADD ON

## 2014-05-25 LAB — MRSA PCR SCREENING: MRSA by PCR: NEGATIVE

## 2014-05-25 LAB — SODIUM, URINE, RANDOM: Sodium, Ur: 29 mEq/L

## 2014-05-25 LAB — HEPATIC FUNCTION PANEL
ALK PHOS: 99 U/L (ref 39–117)
ALT: 39 U/L (ref 0–53)
AST: 81 U/L — ABNORMAL HIGH (ref 0–37)
Albumin: 3.7 g/dL (ref 3.5–5.2)
BILIRUBIN TOTAL: 0.5 mg/dL (ref 0.3–1.2)
Bilirubin, Direct: <0.2 mg/dL (ref 0.0–0.3)
TOTAL PROTEIN: 8 g/dL (ref 6.0–8.3)

## 2014-05-25 LAB — CORTISOL: Cortisol, Plasma: 27.8 ug/dL

## 2014-05-25 LAB — ETHANOL: Alcohol, Ethyl (B): <11 mg/dL (ref 0–11)

## 2014-05-25 LAB — I-STAT TROPONIN, ED: TROPONIN I, POC: 0.01 ng/mL (ref 0.00–0.08)

## 2014-05-25 LAB — LACTATE DEHYDROGENASE: LDH: 300 U/L — ABNORMAL HIGH (ref 94–250)

## 2014-05-25 LAB — AMMONIA: AMMONIA: 42 umol/L (ref 11–60)

## 2014-05-25 LAB — CK: Total CK: 310 U/L — ABNORMAL HIGH (ref 7–232)

## 2014-05-25 LAB — TROPONIN I: Troponin I: <0.3 ng/mL (ref <0.30)

## 2014-05-25 LAB — OSMOLALITY: Osmolality: 262 mOsm/kg — ABNORMAL LOW (ref 275–300)

## 2014-05-25 LAB — OSMOLALITY, URINE: Osmolality, Ur: 165 mOsm/kg — ABNORMAL LOW (ref 390–1090)

## 2014-05-25 LAB — TSH: TSH: 1.89 u[IU]/mL (ref 0.350–4.500)

## 2014-05-25 LAB — LIPASE, BLOOD: LIPASE: 157 U/L — AB (ref 11–59)

## 2014-05-25 MED ORDER — HYDROXYZINE HCL 25 MG PO TABS
25.0000 mg | ORAL_TABLET | Freq: Three times a day (TID) | ORAL | Status: DC | PRN
  Filled 2014-05-25: qty 1

## 2014-05-25 MED ORDER — LORAZEPAM 2 MG/ML IJ SOLN
0.0000 mg | Freq: Four times a day (QID) | INTRAMUSCULAR | Status: DC
  Administered 2014-05-26: 4 mg via INTRAVENOUS
  Administered 2014-05-26 (×2): 1 mg via INTRAVENOUS
  Administered 2014-05-27: 2 mg via INTRAVENOUS
  Filled 2014-05-25: qty 1
  Filled 2014-05-25: qty 2
  Filled 2014-05-25 (×3): qty 1

## 2014-05-25 MED ORDER — VANCOMYCIN HCL IN DEXTROSE 1-5 GM/200ML-% IV SOLN
1000.0000 mg | Freq: Once | INTRAVENOUS | Status: AC
  Administered 2014-05-25: 1000 mg via INTRAVENOUS
  Filled 2014-05-25: qty 200

## 2014-05-25 MED ORDER — SODIUM CHLORIDE 0.9 % IV SOLN
1000.0000 mL | Freq: Once | INTRAVENOUS | Status: AC
  Administered 2014-05-25: 1000 mL via INTRAVENOUS

## 2014-05-25 MED ORDER — HYDROCODONE-ACETAMINOPHEN 5-325 MG PO TABS
1.0000 | ORAL_TABLET | Freq: Four times a day (QID) | ORAL | Status: DC | PRN
  Administered 2014-05-31: 1 via ORAL
  Filled 2014-05-25: qty 1

## 2014-05-25 MED ORDER — INFLUENZA VAC SPLIT QUAD 0.5 ML IM SUSY
0.5000 mL | PREFILLED_SYRINGE | INTRAMUSCULAR | Status: AC
  Administered 2014-05-26: 0.5 mL via INTRAMUSCULAR
  Filled 2014-05-25 (×2): qty 0.5

## 2014-05-25 MED ORDER — ONDANSETRON HCL 4 MG/2ML IJ SOLN: 4.0000 mg | Freq: Four times a day (QID) | INTRAMUSCULAR | Status: DC | PRN

## 2014-05-25 MED ORDER — PROPRANOLOL HCL 10 MG PO TABS
10.0000 mg | ORAL_TABLET | Freq: Three times a day (TID) | ORAL | Status: DC
  Administered 2014-05-25 – 2014-05-31 (×14): 10 mg via ORAL
  Filled 2014-05-25 (×22): qty 1

## 2014-05-25 MED ORDER — ACETAMINOPHEN 325 MG PO TABS
650.0000 mg | ORAL_TABLET | Freq: Once | ORAL | Status: AC
  Administered 2014-05-25: 650 mg via ORAL
  Filled 2014-05-25: qty 2

## 2014-05-25 MED ORDER — PANTOPRAZOLE SODIUM 40 MG PO TBEC
40.0000 mg | DELAYED_RELEASE_TABLET | Freq: Every day | ORAL | Status: DC
Start: 2014-05-25 — End: 2014-05-31
  Administered 2014-05-25 – 2014-05-31 (×7): 40 mg via ORAL
  Filled 2014-05-25 (×7): qty 1

## 2014-05-25 MED ORDER — FOLIC ACID 1 MG PO TABS: 1.0000 mg | ORAL_TABLET | Freq: Every day | ORAL | Status: DC

## 2014-05-25 MED ORDER — SODIUM CHLORIDE 0.9 % IV SOLN
1000.0000 mL | INTRAVENOUS | Status: DC
  Administered 2014-05-25: 1000 mL via INTRAVENOUS

## 2014-05-25 MED ORDER — ENOXAPARIN SODIUM 40 MG/0.4ML ~~LOC~~ SOLN
40.0000 mg | SUBCUTANEOUS | Status: DC
  Administered 2014-05-25 – 2014-05-27 (×3): 40 mg via SUBCUTANEOUS
  Filled 2014-05-25 (×4): qty 0.4

## 2014-05-25 MED ORDER — VITAMIN B-1 100 MG PO TABS
100.0000 mg | ORAL_TABLET | Freq: Every day | ORAL | Status: DC
  Administered 2014-05-25 – 2014-05-31 (×6): 100 mg via ORAL
  Filled 2014-05-25 (×6): qty 1

## 2014-05-25 MED ORDER — IPRATROPIUM BROMIDE 0.02 % IN SOLN: 0.5000 mg | Freq: Four times a day (QID) | RESPIRATORY_TRACT | Status: DC

## 2014-05-25 MED ORDER — VANCOMYCIN HCL 500 MG IV SOLR
500.0000 mg | Freq: Two times a day (BID) | INTRAVENOUS | Status: DC
  Administered 2014-05-25 – 2014-05-27 (×5): 500 mg via INTRAVENOUS
  Filled 2014-05-25 (×6): qty 500

## 2014-05-25 MED ORDER — PNEUMOCOCCAL VAC POLYVALENT 25 MCG/0.5ML IJ INJ
0.5000 mL | INJECTION | INTRAMUSCULAR | Status: AC
Start: 2014-05-26 — End: 2014-05-26
  Administered 2014-05-26: 0.5 mL via INTRAMUSCULAR
  Filled 2014-05-25 (×2): qty 0.5

## 2014-05-25 MED ORDER — TRAZODONE HCL 50 MG PO TABS
50.0000 mg | ORAL_TABLET | Freq: Every day | ORAL | Status: DC
  Administered 2014-05-25 – 2014-05-30 (×6): 50 mg via ORAL
  Filled 2014-05-25 (×8): qty 1

## 2014-05-25 MED ORDER — LORAZEPAM 1 MG PO TABS: 1.0000 mg | ORAL_TABLET | Freq: Four times a day (QID) | ORAL | Status: DC | PRN

## 2014-05-25 MED ORDER — PIPERACILLIN-TAZOBACTAM 3.375 G IVPB
3.3750 g | Freq: Three times a day (TID) | INTRAVENOUS | Status: DC
  Administered 2014-05-25 – 2014-05-28 (×10): 3.375 g via INTRAVENOUS
  Filled 2014-05-25 (×12): qty 50

## 2014-05-25 MED ORDER — VITAMIN B-1 100 MG PO TABS: 100.0000 mg | ORAL_TABLET | Freq: Every day | ORAL | Status: DC

## 2014-05-25 MED ORDER — SODIUM CHLORIDE 0.9 % IJ SOLN
3.0000 mL | Freq: Two times a day (BID) | INTRAMUSCULAR | Status: DC
  Administered 2014-05-25 – 2014-05-29 (×5): 3 mL via INTRAVENOUS

## 2014-05-25 MED ORDER — ADULT MULTIVITAMIN W/MINERALS CH
1.0000 | ORAL_TABLET | Freq: Every day | ORAL | Status: DC
  Administered 2014-05-25 – 2014-05-31 (×7): 1 via ORAL
  Filled 2014-05-25 (×7): qty 1

## 2014-05-25 MED ORDER — SODIUM CHLORIDE 0.9 % IV SOLN
INTRAVENOUS | Status: DC
  Administered 2014-05-25: 13:00:00 via INTRAVENOUS

## 2014-05-25 MED ORDER — BUDESONIDE-FORMOTEROL FUMARATE 80-4.5 MCG/ACT IN AERO
2.0000 | INHALATION_SPRAY | Freq: Two times a day (BID) | RESPIRATORY_TRACT | Status: DC
  Administered 2014-05-25 – 2014-05-31 (×12): 2 via RESPIRATORY_TRACT
  Filled 2014-05-25: qty 6.9

## 2014-05-25 MED ORDER — LORAZEPAM 2 MG/ML IJ SOLN
1.0000 mg | Freq: Once | INTRAMUSCULAR | Status: AC
  Administered 2014-05-25: 1 mg via INTRAVENOUS
  Filled 2014-05-25: qty 1

## 2014-05-25 MED ORDER — IPRATROPIUM-ALBUTEROL 0.5-2.5 (3) MG/3ML IN SOLN: 3.0000 mL | Freq: Four times a day (QID) | RESPIRATORY_TRACT | Status: DC | PRN

## 2014-05-25 MED ORDER — ENSURE PUDDING PO PUDG
1.0000 | Freq: Two times a day (BID) | ORAL | Status: DC
  Administered 2014-05-25 – 2014-05-28 (×5): 1 via ORAL
  Filled 2014-05-25 (×7): qty 1

## 2014-05-25 MED ORDER — FOLIC ACID 1 MG PO TABS
1.0000 mg | ORAL_TABLET | Freq: Every day | ORAL | Status: DC
Start: 2014-05-25 — End: 2014-05-31
  Administered 2014-05-25 – 2014-05-31 (×7): 1 mg via ORAL
  Filled 2014-05-25 (×7): qty 1

## 2014-05-25 MED ORDER — IPRATROPIUM-ALBUTEROL 0.5-2.5 (3) MG/3ML IN SOLN
3.0000 mL | Freq: Four times a day (QID) | RESPIRATORY_TRACT | Status: DC
  Administered 2014-05-25: 3 mL via RESPIRATORY_TRACT
  Filled 2014-05-25: qty 3

## 2014-05-25 MED ORDER — ONE-DAILY MULTI VITAMINS PO TABS: 1.0000 | ORAL_TABLET | Freq: Every day | ORAL | Status: DC

## 2014-05-25 MED ORDER — ACETAMINOPHEN 325 MG PO TABS
650.0000 mg | ORAL_TABLET | Freq: Four times a day (QID) | ORAL | Status: DC | PRN
Start: 2014-05-25 — End: 2014-05-31
  Administered 2014-05-25 – 2014-05-30 (×5): 650 mg via ORAL
  Filled 2014-05-25 (×5): qty 2

## 2014-05-25 MED ORDER — BRIMONIDINE TARTRATE 0.2 % OP SOLN
1.0000 [drp] | Freq: Two times a day (BID) | OPHTHALMIC | Status: DC
  Administered 2014-05-25 – 2014-05-31 (×13): 1 [drp] via OPHTHALMIC
  Filled 2014-05-25 (×2): qty 5

## 2014-05-25 MED ORDER — LORAZEPAM 2 MG/ML IJ SOLN
0.0000 mg | Freq: Two times a day (BID) | INTRAMUSCULAR | Status: DC
  Administered 2014-05-27: 2 mg via INTRAVENOUS
  Filled 2014-05-25: qty 1

## 2014-05-25 MED ORDER — ACETAMINOPHEN 650 MG RE SUPP
650.0000 mg | Freq: Four times a day (QID) | RECTAL | Status: DC | PRN
Start: 2014-05-25 — End: 2014-05-31
  Administered 2014-05-27: 650 mg via RECTAL
  Filled 2014-05-25: qty 1

## 2014-05-25 MED ORDER — ATORVASTATIN CALCIUM 80 MG PO TABS
80.0000 mg | ORAL_TABLET | Freq: Every day | ORAL | Status: DC
  Administered 2014-05-25 – 2014-05-31 (×7): 80 mg via ORAL
  Filled 2014-05-25 (×2): qty 1
  Filled 2014-05-25 (×2): qty 2
  Filled 2014-05-25: qty 1
  Filled 2014-05-25: qty 2
  Filled 2014-05-25: qty 1

## 2014-05-25 MED ORDER — SODIUM CHLORIDE 0.9 % IV SOLN
500.0000 mg | Freq: Two times a day (BID) | INTRAVENOUS | Status: DC
  Filled 2014-05-25: qty 500

## 2014-05-25 MED ORDER — ALBUTEROL SULFATE (2.5 MG/3ML) 0.083% IN NEBU: 3.0000 mL | INHALATION_SOLUTION | Freq: Four times a day (QID) | RESPIRATORY_TRACT | Status: DC

## 2014-05-25 MED ORDER — VITAMIN B-12 100 MCG PO TABS
100.0000 ug | ORAL_TABLET | Freq: Every day | ORAL | Status: DC
  Administered 2014-05-25 – 2014-05-31 (×7): 100 ug via ORAL
  Filled 2014-05-25 (×7): qty 1

## 2014-05-25 MED ORDER — THIAMINE HCL 100 MG/ML IJ SOLN
100.0000 mg | Freq: Every day | INTRAMUSCULAR | Status: DC
  Administered 2014-05-27: 100 mg via INTRAVENOUS
  Filled 2014-05-25 (×3): qty 1
  Filled 2014-05-25: qty 2
  Filled 2014-05-25: qty 1

## 2014-05-25 MED ORDER — FERROUS SULFATE 325 (65 FE) MG PO TABS
325.0000 mg | ORAL_TABLET | Freq: Every day | ORAL | Status: DC
  Administered 2014-05-25 – 2014-05-31 (×7): 325 mg via ORAL
  Filled 2014-05-25 (×8): qty 1

## 2014-05-25 MED ORDER — PIPERACILLIN-TAZOBACTAM 3.375 G IVPB
3.3750 g | Freq: Once | INTRAVENOUS | Status: AC
  Administered 2014-05-25: 3.375 g via INTRAVENOUS
  Filled 2014-05-25: qty 50

## 2014-05-25 MED ORDER — LORAZEPAM 2 MG/ML IJ SOLN
1.0000 mg | Freq: Four times a day (QID) | INTRAMUSCULAR | Status: DC | PRN
Start: 2014-05-25 — End: 2014-05-27
  Administered 2014-05-26 (×3): 1 mg via INTRAVENOUS
  Filled 2014-05-25 (×2): qty 1

## 2014-05-25 MED ORDER — ONDANSETRON HCL 4 MG PO TABS: 4.0000 mg | ORAL_TABLET | Freq: Four times a day (QID) | ORAL | Status: DC | PRN

## 2014-05-25 MED ORDER — VANCOMYCIN HCL 500 MG IV SOLR
500.0000 mg | Freq: Two times a day (BID) | INTRAVENOUS | Status: DC
  Filled 2014-05-25: qty 500

## 2014-05-25 NOTE — Progress Notes (Signed)
ANTIBIOTIC CONSULT NOTE - INITIAL  Pharmacy Consult for Vancomycin Indication: Sepsis  Allergies  Allergen Reactions  . Poison Ivy Extract [Extract Of Poison Ivy] Rash    Patient Measurements: Height: 5\' 7"  (170.2 cm) Weight: 142 lb 6.7 oz (64.6 kg) IBW/kg (Calculated) : 66.1   Vital Signs: Temp: 99.2 F (37.3 C) (11/19 0630) Temp Source: Oral (11/19 0630) BP: 153/94 mmHg (11/19 0630) Pulse Rate: 102 (11/19 0630) Intake/Output from previous day:   Intake/Output from this shift:    Labs:  Recent Labs  05/25/14 0145 05/25/14 0223  WBC 4.4  --   HGB 9.5* 11.6*  PLT 95*  --   CREATININE 1.26 1.40*   Estimated Creatinine Clearance: 50.6 mL/min (by C-G formula based on Cr of 1.4). No results for input(s): VANCOTROUGH, VANCOPEAK, VANCORANDOM, GENTTROUGH, GENTPEAK, GENTRANDOM, TOBRATROUGH, TOBRAPEAK, TOBRARND, AMIKACINPEAK, AMIKACINTROU, AMIKACIN in the last 72 hours.   Microbiology: No results found for this or any previous visit (from the past 720 hour(s)).  Medical History: Past Medical History  Diagnosis Date  . Hypertension   . Hyperlipemia   . Emphysema   . Stroke   . Chronic pain   . Cirrhosis of liver   . Fall   . Dental injury   . Bone spur of other site     Left Foot  . Impaired mobility   . Total self care deficit   . Pneumonia   . Emphysema   . ETOH abuse   . Elevated LFTs   . Arthritis   . Anxiety   . Sinusitis   . Anemia     iron def.  . Hard of hearing   . Diabetes mellitus without complication   . COPD (chronic obstructive pulmonary disease)   . Depression   . Glaucoma   . Emphysema of lung   . Cataract     Medications:  Scheduled:  . albuterol  3 mL Inhalation QID  . atorvastatin  80 mg Oral Daily  . brimonidine  1 drop Both Eyes BID  . budesonide-formoterol  2 puff Inhalation BID  . enoxaparin (LOVENOX) injection  40 mg Subcutaneous Q24H  . ferrous sulfate  325 mg Oral Q breakfast  . folic acid  1 mg Oral Daily  .  ipratropium  0.5 mg Nebulization Q6H  . LORazepam  0-4 mg Intravenous Q6H   Followed by  . [START ON 05/27/2014] LORazepam  0-4 mg Intravenous Q12H  . multivitamin with minerals  1 tablet Oral Daily  . pantoprazole  40 mg Oral Daily  . propranolol  10 mg Oral TID  . sodium chloride  3 mL Intravenous Q12H  . thiamine  100 mg Oral Daily   Or  . thiamine  100 mg Intravenous Daily  . traZODone  50 mg Oral QHS  . vancomycin (VANCOCIN) 750 mg IVPB  500 mg Intravenous Q12H  . vitamin B-12  100 mcg Oral Daily   Infusions:   Assessment: 76 yoM c/o generalized weakness, fever, tachycardia.  Vancomycin per Rx for Sepsis.   Goal of Therapy:  Vancomycin trough level 15-20 mcg/ml  Plan:   Vancomycin 500mg  IV q12h  F/U SCr/levels/cultures  Lawana Pai R 05/25/2014,6:56 AM

## 2014-05-25 NOTE — ED Provider Notes (Signed)
CSN: [id number]     Arrival date & time 05/25/14  0022 History   First MD Initiated Contact with Patient 05/25/14 0124     Chief Complaint  Patient presents with  . Weakness      HPI Patient reports severe generalized weakness today.  Found to have a fever on arrival to the emergency department.  History of sepsis before in the past.  He does report productive cough.  He denies urinary complaints.  No chest pain shortness of breath.  Denies abdominal pain.  No diarrhea.  Reports nausea and vomiting today but reports no melena or hematochezia.  Patient's family reports that he drinks alcohol daily.   Past Medical History  Diagnosis Date  . Hypertension   . Hyperlipemia   . Emphysema   . Stroke   . Chronic pain   . Cirrhosis of liver   . Fall   . Dental injury   . Bone spur of other site     Left Foot  . Impaired mobility   . Total self care deficit   . Pneumonia   . Emphysema   . ETOH abuse   . Elevated LFTs   . Arthritis   . Anxiety   . Sinusitis   . Anemia     iron def.  . Hard of hearing   . Diabetes mellitus without complication   . COPD (chronic obstructive pulmonary disease)   . Depression   . Glaucoma   . Emphysema of lung   . Cataract    Past Surgical History  Procedure Laterality Date  . Colonoscopy  01/06/2012    Procedure: COLONOSCOPY;  Surgeon: Beryle Beams, MD;  Location: [facility] ENDOSCOPY;  Service: Endoscopy;  Laterality: N/A;  . Esophagogastroduodenoscopy  01/06/2012    Procedure: ESOPHAGOGASTRODUODENOSCOPY (EGD);  Surgeon: Beryle Beams, MD;  Location: [facility] ENDOSCOPY;  Service: Endoscopy;  Laterality: N/A;  . Esophagogastroduodenoscopy  01/09/2012    Procedure: ESOPHAGOGASTRODUODENOSCOPY (EGD);  Surgeon: Beryle Beams, MD;  Location: [facility] ENDOSCOPY;  Service: Endoscopy;  Laterality: N/A;  . Multiple extractions with alveoloplasty  03/29/2012    Procedure: MULTIPLE EXTRACION WITH ALVEOLOPLASTY;  Surgeon: Gae Bon, DDS;  Location: [place];  Service: Oral  Surgery;  Laterality: Bilateral;  . Bone spur  2013    left spur  . Cataract extraction w/ intraocular lens implant    . Mouth surgery    . Colonoscopy     Family History  Problem Relation Age of Onset  . Breast cancer Mother   . Diabetes Maternal Aunt   . Heart disease Sister   . Heart disease Maternal Aunt    History  Substance Use Topics  . Smoking status: Current Every Day Smoker -- 1.00 packs/day for 30 years    Types: Cigarettes  . Smokeless tobacco: Never Used  . Alcohol Use: 2.4 oz/week    4 Cans of beer per week     Comment: per pt  2 40oz daily beer. Last drink: 2 hrs ago    Review of Systems  All other systems reviewed and are negative.     Allergies  Poison ivy extract  Home Medications   Prior to Admission medications   Medication Sig Start Date End Date Taking? Authorizing Provider  acamprosate (CAMPRAL) 333 MG tablet Take 2 tablets (666 mg total) by mouth 3 (three) times daily with meals. 12/15/13   Lorayne Marek, MD  albuterol (PROVENTIL HFA;VENTOLIN HFA) 108 (90 BASE) MCG/ACT inhaler Inhale 2 puffs into the  lungs 4 (four) times daily. 12/15/13   Lorayne Marek, MD  atorvastatin (LIPITOR) 80 MG tablet Take 1 tablet (80 mg total) by mouth daily. 12/15/13   Lorayne Marek, MD  brimonidine (ALPHAGAN) 0.2 % ophthalmic solution Place 1 drop into both eyes 2 (two) times daily. 12/15/13   Lorayne Marek, MD  budesonide-formoterol (SYMBICORT) 80-4.5 MCG/ACT inhaler Inhale 2 puffs into the lungs 2 (two) times daily. 12/15/13   Lorayne Marek, MD  clotrimazole (LOTRIMIN) 1 % cream Apply topically 2 (two) times daily. 12/15/13   Lorayne Marek, MD  ferrous sulfate 325 (65 FE) MG tablet Take 1 tablet (325 mg total) by mouth daily with breakfast. For low iron. 12/15/13   Lorayne Marek, MD  folic acid (FOLVITE) 1 MG tablet Take 1 tablet (1 mg total) by mouth daily. 12/15/13   Lorayne Marek, MD  HYDROcodone-acetaminophen (NORCO/VICODIN) 5-325 MG per tablet Take 1 tablet by mouth  every 6 (six) hours as needed for moderate pain.    Historical Provider, MD  hydrOXYzine (ATARAX/VISTARIL) 25 MG tablet Take 1 tablet (25 mg total) by mouth 3 (three) times daily as needed for anxiety. 12/15/13   Lorayne Marek, MD  Multiple Vitamin (MULTIVITAMIN) tablet Take 1 tablet by mouth daily. 02/16/13   Tresa Garter, MD  nicotine (NICODERM CQ) 21 mg/24hr patch Place 1 patch (21 mg total) onto the skin daily. 12/15/13   Lorayne Marek, MD  pantoprazole (PROTONIX) 40 MG tablet Take 1 tablet (40 mg total) by mouth daily. 12/15/13   Lorayne Marek, MD  propranolol (INDERAL) 10 MG tablet Take 1 tablet (10 mg total) by mouth 3 (three) times daily. 12/15/13   Lorayne Marek, MD  thiamine (VITAMIN B-1) 100 MG tablet Take 1 tablet (100 mg total) by mouth daily. For vitamin B deficiency. 12/15/13   Lorayne Marek, MD  traZODone (DESYREL) 50 MG tablet Take 1 tablet (50 mg total) by mouth at bedtime. 12/15/13   Lorayne Marek, MD  vitamin B-12 (CYANOCOBALAMIN) 100 MCG tablet Take 1 tablet (100 mcg total) by mouth daily. For low B12. 12/15/13   Lorayne Marek, MD  vitamin E 200 UNIT capsule Take 1 capsule (200 Units total) by mouth daily. 02/16/13   Olugbemiga E Doreene Burke, MD   BP 132/92 mmHg  Pulse 112  Temp(Src) 101.1 F (38.4 C) (Oral)  Resp 20  SpO2 99% Physical Exam  Constitutional: He is oriented to person, place, and time. He appears well-developed and well-nourished.  HENT:  Head: Normocephalic and atraumatic.  Eyes: EOM are normal.  Neck: Normal range of motion.  Cardiovascular: Regular rhythm, normal heart sounds and intact distal pulses.   Tachycardia  Pulmonary/Chest: Effort normal and breath sounds normal. No respiratory distress.  Abdominal: Soft. He exhibits no distension. There is no tenderness.  Musculoskeletal: Normal range of motion.  Neurological: He is alert and oriented to person, place, and time.  Skin: Skin is warm and dry.  Psychiatric: He has a normal mood and affect. Judgment  normal.  Nursing note and vitals reviewed.   ED Course  Procedures (including critical care time)  CRITICAL CARE Performed by: Hoy Morn Total critical care time: 35 Critical care time was exclusive of separately billable procedures and treating other patients. Critical care was necessary to treat or prevent imminent or life-threatening deterioration. Critical care was time spent personally by me on the following activities: development of treatment plan with patient and/or surrogate as well as nursing, discussions with consultants, evaluation of patient's response to treatment, examination of patient,  obtaining history from patient or surrogate, ordering and performing treatments and interventions, ordering and review of laboratory studies, ordering and review of radiographic studies, pulse oximetry and re-evaluation of patient's condition.   Labs Review Labs Reviewed  CBC - Abnormal; Notable for the following:    RBC 3.60 (*)    Hemoglobin 9.5 (*)    HCT 27.5 (*)    MCV 76.4 (*)    Platelets 95 (*)    All other components within normal limits  COMPREHENSIVE METABOLIC PANEL - Abnormal; Notable for the following:    Sodium 116 (*)    Chloride 76 (*)    Total Protein 8.7 (*)    AST 99 (*)    GFR calc non Af Amer 60 (*)    GFR calc Af Amer 69 (*)    Anion gap 21 (*)    All other components within normal limits  LIPASE, BLOOD - Abnormal; Notable for the following:    Lipase 157 (*)    All other components within normal limits  URINALYSIS, ROUTINE W REFLEX MICROSCOPIC - Abnormal; Notable for the following:    APPearance CLOUDY (*)    Hgb urine dipstick SMALL (*)    Ketones, ur 15 (*)    Protein, ur 30 (*)    Leukocytes, UA LARGE (*)    All other components within normal limits  URINE MICROSCOPIC-ADD ON - Abnormal; Notable for the following:    Bacteria, UA MANY (*)    All other components within normal limits  I-STAT CHEM 8, ED - Abnormal; Notable for the following:     Sodium 116 (*)    Chloride 84 (*)    Creatinine, Ser 1.40 (*)    Calcium, Ion 1.06 (*)    Hemoglobin 11.6 (*)    HCT 34.0 (*)    All other components within normal limits  I-STAT CG4 LACTIC ACID, ED - Abnormal; Notable for the following:    Lactic Acid, Venous 3.74 (*)    All other components within normal limits  URINE CULTURE  CULTURE, BLOOD (ROUTINE X 2)  CULTURE, BLOOD (ROUTINE X 2)  ETHANOL  CBG MONITORING, ED  CBG MONITORING, ED  I-STAT TROPOININ, ED    Imaging Review Dg Chest 2 View  05/25/2014   CLINICAL DATA:  Fever, weakness, altered mental status.  EXAM: CHEST  2 VIEW  COMPARISON:  10/31/2013  FINDINGS: Shallow inspiration with atelectasis in the lung bases. Normal heart size and pulmonary vascularity. No focal airspace disease in the lungs. No blunting of costophrenic angles. No pneumothorax. Old fracture deformity of the left clavicle.  IMPRESSION: Shallow inspiration with atelectasis in the lung bases.   Electronically Signed   By: Lucienne Capers M.D.   On: 05/25/2014 03:32     EKG Interpretation   Date/Time:  Thursday May 25 2014 00:27:45 EST Ventricular Rate:  123 PR Interval:  156 QRS Duration: 84 QT Interval:  320 QTC Calculation: 458 R Axis:   82 Text Interpretation:  Sinus tachycardia Borderline right axis deviation  Borderline T wave abnormalities Baseline wander in lead(s) V2 No  significant change was found Confirmed by Shruti Arrey  MD, Trysten Bernard ([id number]) on  05/25/2014 2:39:45 AM      MDM   Final diagnoses:  Fever  Sepsis, due to unspecified organism  Hyponatremia    Patient with fever and sepsis syndrome on arrival.  Tachycardic.  Febrile.  No hypotension.  Lactate elevated.  Unclear source at this time.  Blood and urine cultures.  Patient seems to be responding to IV fluids.  A she is extremely hyponatremic with a sodium of 116.  Patient will need to be admitted to the stepdown unit.    Hoy Morn, MD 05/25/14 [phone]

## 2014-05-25 NOTE — Care Management Note (Signed)
    Page 1 of 2   05/25/2014     4:26:19 PM CARE MANAGEMENT NOTE 05/25/2014  Patient:  Peter [name], Peter [name]   Account Number:  [id number]  Date Initiated:  05/25/2014  Documentation initiated by:  Lasonja Lakins  Subjective/Objective Assessment:   pt with sepsis versus pna, intoxicated on admit etoh abuse hx     Action/Plan:   tbd   Anticipated DC Date:  05/28/2014   Anticipated DC Plan:  HOME/SELF CARE  In-house referral  Clinical Social Worker      DC Planning Services  CM consult      [facility] Choice  NA   Choice offered to / List presented to:  NA   DME arranged  NA      DME agency  NA     [place] arranged  NA      [place] agency  NA   Status of service:  In process, will continue to follow Medicare Important Message given?  NA - LOS <3 / Initial given by admissions (If response is "NO", the following Medicare IM given date fields will be blank) Date Medicare IM given:   Medicare IM given by:   Date Additional Medicare IM given:   Additional Medicare IM given by:    Discharge Disposition:    Per UR Regulation:  Reviewed for med. necessity/level of care/duration of stay  If discussed at [place] of Stay Meetings, dates discussed:    Comments:  [id number]/Shayne Diguglielmo Rosana Hoes, RN, BSN, CCM Chart reviewed. Discharge needs and patient's stay to be reviewed and followed by case manager. Chart note for progression of stay:

## 2014-05-25 NOTE — ED Notes (Signed)
PA made aware of patient CG4 Lactic and Chem 8 results.

## 2014-05-25 NOTE — ED Notes (Signed)
Pt being transported to radiology

## 2014-05-25 NOTE — H&P (Signed)
[facility] History and Physical  AN SCHNABEL NWG:[id number] DOB: 03-23-1951 DOA: 05/25/2014  Referring physician: ER physician. PCP: Lorayne Marek, MD  Chief Complaint: Weakness.  Most of the history obtained from ER physician as patient is alcohol intoxicated and does not provide much history. Patient's mother at the bedside has not contributed much to the history.  HPI: Peter [name] is a 63 y.o. male with history of alcoholism who lives with his brother was brought to the ER after patient was found to be weak with chills and rigors. Patient mother states that patient was found to have sweating and chills. Patient drinks alcohol everyday. Denies any chest pain or shortness of breath abdominal pain diarrhea. Patient is as per mother noncompliant with his medications. In the ER patient was found to be febrile with labs showing severe hyponatremia and sodium was around 116. Lactic acid was elevated. ER physician had given patient 2 L of normal saline bolus and blood cultures were sent and patient was turned and take antibiotics as patient was admitted last May with sepsis secondary to H influenza and was placed on ventilator for respiratory failure that time. Patient on my exam is following commands but looks confused.   Review of Systems: As presented in the history of presenting illness, rest negative.  Past Medical History  Diagnosis Date  . Hypertension   . Hyperlipemia   . Emphysema   . Stroke   . Chronic pain   . Cirrhosis of liver   . Fall   . Dental injury   . Bone spur of other site     Left Foot  . Impaired mobility   . Total self care deficit   . Pneumonia   . Emphysema   . ETOH abuse   . Elevated LFTs   . Arthritis   . Anxiety   . Sinusitis   . Anemia     iron def.  . Hard of hearing   . Diabetes mellitus without complication   . COPD (chronic obstructive pulmonary disease)   . Depression   . Glaucoma   . Emphysema of lung   . Cataract    Past  Surgical History  Procedure Laterality Date  . Colonoscopy  01/06/2012    Procedure: COLONOSCOPY;  Surgeon: Beryle Beams, MD;  Location: [facility] ENDOSCOPY;  Service: Endoscopy;  Laterality: N/A;  . Esophagogastroduodenoscopy  01/06/2012    Procedure: ESOPHAGOGASTRODUODENOSCOPY (EGD);  Surgeon: Beryle Beams, MD;  Location: [facility] ENDOSCOPY;  Service: Endoscopy;  Laterality: N/A;  . Esophagogastroduodenoscopy  01/09/2012    Procedure: ESOPHAGOGASTRODUODENOSCOPY (EGD);  Surgeon: Beryle Beams, MD;  Location: [facility] ENDOSCOPY;  Service: Endoscopy;  Laterality: N/A;  . Multiple extractions with alveoloplasty  03/29/2012    Procedure: MULTIPLE EXTRACION WITH ALVEOLOPLASTY;  Surgeon: Gae Bon, DDS;  Location: [place];  Service: Oral Surgery;  Laterality: Bilateral;  . Bone spur  2013    left spur  . Cataract extraction w/ intraocular lens implant    . Mouth surgery    . Colonoscopy     Social History:  reports that he has been smoking Cigarettes.  He has a 30 pack-year smoking history. He has never used smokeless tobacco. He reports that he drinks about 2.4 oz of alcohol per week. He reports that he does not use illicit drugs. Where does patient live home. Can patient participate in ADLs? Yes.  Allergies  Allergen Reactions  . Poison Ivy Extract [employer] Poison Ivy] Rash  Family History:  Family History  Problem Relation Age of Onset  . Breast cancer Mother   . Diabetes Maternal Aunt   . Heart disease Sister   . Heart disease Maternal Aunt       Prior to Admission medications   Medication Sig Start Date End Date Taking? Authorizing Provider  acamprosate (CAMPRAL) 333 MG tablet Take 2 tablets (666 mg total) by mouth 3 (three) times daily with meals. 12/15/13  Yes Lorayne Marek, MD  albuterol (PROVENTIL HFA;VENTOLIN HFA) 108 (90 BASE) MCG/ACT inhaler Inhale 2 puffs into the lungs 4 (four) times daily. 12/15/13  Yes Lorayne Marek, MD  atorvastatin (LIPITOR) 80 MG tablet Take 1 tablet (80 mg  total) by mouth daily. 12/15/13  Yes Deepak Advani, MD  brimonidine (ALPHAGAN) 0.2 % ophthalmic solution Place 1 drop into both eyes 2 (two) times daily. 12/15/13  Yes Lorayne Marek, MD  budesonide-formoterol (SYMBICORT) 80-4.5 MCG/ACT inhaler Inhale 2 puffs into the lungs 2 (two) times daily. 12/15/13  Yes Lorayne Marek, MD  clotrimazole (LOTRIMIN) 1 % cream Apply topically 2 (two) times daily. 12/15/13  Yes Lorayne Marek, MD  ferrous sulfate 325 (65 FE) MG tablet Take 1 tablet (325 mg total) by mouth daily with breakfast. For low iron. 12/15/13  Yes Lorayne Marek, MD  folic acid (FOLVITE) 1 MG tablet Take 1 tablet (1 mg total) by mouth daily. 12/15/13  Yes Lorayne Marek, MD  HYDROcodone-acetaminophen (NORCO/VICODIN) 5-325 MG per tablet Take 1 tablet by mouth every 6 (six) hours as needed for moderate pain.   Yes Historical Provider, MD  hydrOXYzine (ATARAX/VISTARIL) 25 MG tablet Take 1 tablet (25 mg total) by mouth 3 (three) times daily as needed for anxiety. 12/15/13  Yes Lorayne Marek, MD  Multiple Vitamin (MULTIVITAMIN) tablet Take 1 tablet by mouth daily. 02/16/13  Yes Tresa Garter, MD  pantoprazole (PROTONIX) 40 MG tablet Take 1 tablet (40 mg total) by mouth daily. 12/15/13  Yes Lorayne Marek, MD  propranolol (INDERAL) 10 MG tablet Take 1 tablet (10 mg total) by mouth 3 (three) times daily. 12/15/13  Yes Lorayne Marek, MD  thiamine (VITAMIN B-1) 100 MG tablet Take 1 tablet (100 mg total) by mouth daily. For vitamin B deficiency. 12/15/13  Yes Lorayne Marek, MD  traZODone (DESYREL) 50 MG tablet Take 1 tablet (50 mg total) by mouth at bedtime. 12/15/13  Yes Lorayne Marek, MD  vitamin B-12 (CYANOCOBALAMIN) 100 MCG tablet Take 1 tablet (100 mcg total) by mouth daily. For low B12. 12/15/13  Yes Lorayne Marek, MD  vitamin E 200 UNIT capsule Take 1 capsule (200 Units total) by mouth daily. 02/16/13  Yes Olugbemiga Essie Christine, MD  nicotine (NICODERM CQ) 21 mg/24hr patch Place 1 patch (21 mg total) onto the skin  daily. 12/15/13   Lorayne Marek, MD    Physical Exam: Filed Vitals:   05/25/14 0349 05/25/14 0400 05/25/14 0612 05/25/14 0630  BP: 132/92 151/105 135/77 153/94  Pulse: 112 130 109 102  Temp: 101.1 F (38.4 C)  100.1 F (37.8 C) 99.2 F (37.3 C)  TempSrc: Oral  Oral Oral  Resp: 20  20   Height:    5\' 7"  (1.702 m)  Weight:    64.6 kg (142 lb 6.7 oz)  SpO2: 99% 100% 98% 100%     General:  Moderately built and nourished.  Eyes: Anicteric no pallor.  ENT: No discharge from the ears eyes nose or mouth.  Neck: No mass felt.  Cardiovascular: S1 and S2 heard.  Respiratory: No rhonchi or crepitations.  Abdomen: Soft nontender bowel sounds present.  Skin: No rash.  Musculoskeletal: No edema.  Psychiatric: Patient looks confused but follows commands.  Neurologic: Patient looks confused but follows commands and moves all extremities.  Labs on Admission:  Basic Metabolic Panel:  Recent Labs Lab 05/25/14 0145 05/25/14 0223  NA 116* 116*  K 4.3 4.2  CL 76* 84*  CO2 19  --   GLUCOSE 74 71  BUN 17 16  CREATININE 1.26 1.40*  CALCIUM 9.6  --    Liver Function Tests:  Recent Labs Lab 05/25/14 0145  AST 99*  ALT 46  ALKPHOS 107  BILITOT 0.4  PROT 8.7*  ALBUMIN 4.0    Recent Labs Lab 05/25/14 0145  LIPASE 157*   No results for input(s): AMMONIA in the last 168 hours. CBC:  Recent Labs Lab 05/25/14 0145 05/25/14 0223  WBC 4.4  --   HGB 9.5* 11.6*  HCT 27.5* 34.0*  MCV 76.4*  --   PLT 95*  --    Cardiac Enzymes: No results for input(s): CKTOTAL, CKMB, CKMBINDEX, TROPONINI in the last 168 hours.  BNP (last 3 results)  Recent Labs  11/02/13 0736  PROBNP 12677.0*   CBG:  Recent Labs Lab 05/25/14 0042  GLUCAP 74    Radiological Exams on Admission: Dg Chest 2 View  05/25/2014   CLINICAL DATA:  Fever, weakness, altered mental status.  EXAM: CHEST  2 VIEW  COMPARISON:  10/31/2013  FINDINGS: Shallow inspiration with atelectasis in the lung  bases. Normal heart size and pulmonary vascularity. No focal airspace disease in the lungs. No blunting of costophrenic angles. No pneumothorax. Old fracture deformity of the left clavicle.  IMPRESSION: Shallow inspiration with atelectasis in the lung bases.   Electronically Signed   By: Lucienne Capers M.D.   On: 05/25/2014 03:32    EKG: Independently reviewed. Sinus tachycardia.  Assessment/Plan Active Problems:   COPD (chronic obstructive pulmonary disease)   Hyponatremia   Acute alcohol intoxication   Cirrhosis   Sepsis   Fever   Alcohol abuse   1. Severe hyponatremia - probably secondary to alcoholism. But given the patient has increased lactic acid levels patient did received 2 L normal saline bolus. At this time I have ordered urine sodium osmolarity serum osmolality. Check metabolic panel frequently and based on which we'll have further plans with regarding to patient's hyponatremia. Check TSH and cortisol levels. For now I'm holding off further fluids. 2. Fever with possible developing sepsis - blood cultures and urine cultures were sent. Urine does show some changes concerning for UTI. Patient has been placed temporarily on vancomycin and Zosyn. Patient was admitted in May of this year for sepsis secondary to H. influenzae and was on ventilator for respiratory failure. Closely observe. Check influenza PCR. 3. Alcohol intoxication - patient has been placed on thiamine and Ativan withdrawal protocol. Since history is not clear and patient has history of alcoholism we will get CT head as patient looks confused to rule out any bleed. 4. Chronic anemia - follow CBC closely. Patient did have EGD in 2014 November which did not show any varices. 5. Thrombocytopenia - probably from alcoholism but given that patient also has fever closely follow for any further worsening and may need DIC workup by that time. Will check LDH to rule out any hemolytic pattern given patient also has renal  failure. 6. Acute renal failure - probably secondary to poor oral intake. Closely follow intake and  output and metabolic panel. 7. Cirrhosis of the liver - EGD done in 2040 in November did not show any varices. Check ammonia levels. 8. COPD - presently not wheezing. 9. History of systolic CHF last EF measured was 45-50% - appears dehydrated.    Code Status: Full code.  Family Communication: Patient's mother at the bedside.  Disposition Plan: Admit to inpatient.    Debbrah Sampedro N. [facility] Pager [phone].  If 7PM-7AM, please contact night-coverage [email or web address] Password [email or web address] 05/25/2014, 6:45 AM

## 2014-05-25 NOTE — Progress Notes (Signed)
ANTIBIOTIC CONSULT NOTE - FOLLOW UP  Pharmacy Consult for Vancomycin, Zosyn Indication: rule out sepsis  Allergies  Allergen Reactions  . Poison Ivy Extract [Extract Of Poison Ivy] Rash    Patient Measurements: Height: 5\' 7"  (170.2 cm) Weight: 142 lb 6.7 oz (64.6 kg) IBW/kg (Calculated) : 66.1  Vital Signs: Temp: 99.2 F (37.3 C) (11/19 0630) Temp Source: Oral (11/19 0630) BP: 153/94 mmHg (11/19 0638) Pulse Rate: 102 (11/19 0638)  Labs:  Recent Labs  05/25/14 0145 05/25/14 0223 05/25/14 0810  WBC 4.4  --  2.9*  HGB 9.5* 11.6* 9.0*  PLT 95*  --  100*  CREATININE 1.26 1.40* 1.13   Estimated Creatinine Clearance: 62.7 mL/min (by C-G formula based on Cr of 1.13). No results for input(s): VANCOTROUGH, VANCOPEAK, VANCORANDOM, GENTTROUGH, GENTPEAK, GENTRANDOM, TOBRATROUGH, TOBRAPEAK, TOBRARND, AMIKACINPEAK, AMIKACINTROU, AMIKACIN in the last 72 hours.    Assessment: 51 yoM admitted 11/19 with alcohol intoxication, fever, weakness, chills, rigors.  PMH is significant for sepsis (Metapneumovirus and Haemophilus Influenza positive 11/2013)and ventilation for respiratory failure.  Pharmacy is consulted to dose Vancomycin and Zosyn.  Today, 05/25/2014   Tmax: 101.3  WBCs: 4.4  Renal: SCr 1.40, CrCl ~ 50 ml/min CG  Blood and urine cultures pending.  Goal of Therapy:  Vancomycin trough level 15-20 mcg/ml Appropriate abx dosing, eradication of infection.  Plan:   Zosyn 3.375g IV Q8H infused over 4hrs.  Vancomycin 1g IV x1 dose, then 500 mg IV q12h.  Measure Vanc trough at steady state.  Follow up renal fxn and culture results.   Gretta Arab PharmD, BCPS Pager [phone] 05/25/2014 10:38 AM

## 2014-05-25 NOTE — ED Notes (Signed)
Attempted to call report, primary nurse will call when available.

## 2014-05-25 NOTE — Progress Notes (Signed)
INITIAL NUTRITION ASSESSMENT  DOCUMENTATION CODES Per approved criteria  -Non-severe (moderate) malnutrition in the context of chronic illness  Pt meets criteria for moderate MALNUTRITION in the context of chronic illness on social/environmental as evidenced by PO intake < 75% for > 3 months, mild muscle wasting in clavicle region.   INTERVENTION: -Recommend Ensure Pudding po BID, each supplement provides 170 kcal and 4 grams of protein for nutrient replenishment -RD to continue to monitor   NUTRITION DIAGNOSIS: Inadequate oral intake related to decreased appetite/ETOH abuse as evidenced by decreased PO intake for past two weeks.   Goal: Pt to meet >/= 90% of their estimated nutrition needs    Monitor:  Total protein/energy intake, labs, weights  Reason for Assessment: MST  63 y.o. male  Admitting Dx: <principal problem not specified>  ASSESSMENT: Peter [name] is a 63 y.o. male with history of alcoholism who lives with his brother was brought to the ER after patient was found to be weak with chills and rigors. Patient mother states that patient was found to have sweating and chills. Patient drinks alcohol everyday  -Pt continues to be slightly confused during time of RD assessment; however reported to have had a decreased appetite for past two weeks. Diet recall indicated pt consuming one meal daily, typically of soft foods as he has poor dentition. MD noted pt with hx of ETOH abuse, which is also likely contributing to sub-optimal nutrition intake -Was evaluated by RD during previous admit in 11/2013 with hx of decreased intake d/t ETOH abuse -Denied unintentional wt loss; however previous medical records indicate pt with 12 lb weight loss in past 7 months (8% body weight loss, non-severe for time frame) -Pt currently eating well, consuming > 75% of meals. Family assisting pt order meals -Severe hyponatremia upon admit, Na 116; recommend fluid restriction of 1-1.2L. Gradually  improving to 125 -Pt with mild wasting in clavicle and acromion regions  Height: Ht Readings from Last 1 Encounters:  05/25/14 5\' 7"  (1.702 m)    Weight: Wt Readings from Last 1 Encounters:  05/25/14 142 lb 6.7 oz (64.6 kg)    Ideal Body Weight: 148 lbs  % Ideal Body Weight: 96%  Wt Readings from Last 10 Encounters:  05/25/14 142 lb 6.7 oz (64.6 kg)  12/15/13 139 lb 12.8 oz (63.413 kg)  11/03/13 154 lb 14.4 oz (70.262 kg)  08/01/13 154 lb 1.6 oz (69.9 kg)  06/07/13 149 lb 9.6 oz (67.858 kg)  05/19/13 142 lb (64.411 kg)  04/26/13 142 lb 2 oz (64.467 kg)  04/06/13 141 lb 9.6 oz (64.229 kg)  02/16/13 140 lb (63.504 kg)  01/18/13 137 lb 9.1 oz (62.4 kg)    Usual Body Weight: approximately 150 lb per previous medical records  % Usual Body Weight: 94%  BMI:  Body mass index is 22.3 kg/(m^2).  Estimated Nutritional Needs: Kcal: 1700-1900 Protein: 70-85 gram Fluid: 1-1.2 L, modify with improvement in sodium  Skin: WDL, generalized edema  Diet Order: Diet regular  EDUCATION NEEDS: -No education needs identified at this time  No intake or output data in the 24 hours ending 05/25/14 1034  Last BM: 11/19   Labs:   Recent Labs Lab 05/25/14 0145 05/25/14 0223 05/25/14 0810  NA 116* 116* 125*  K 4.3 4.2 4.0  CL 76* 84* 87*  CO2 19  --  19  BUN 17 16 15   CREATININE 1.26 1.40* 1.13  CALCIUM 9.6  --  9.2  GLUCOSE 74 71 79  CBG (last 3)   Recent Labs  05/25/14 0042  GLUCAP 74    Scheduled Meds: . atorvastatin  80 mg Oral Daily  . brimonidine  1 drop Both Eyes BID  . budesonide-formoterol  2 puff Inhalation BID  . enoxaparin (LOVENOX) injection  40 mg Subcutaneous Q24H  . ferrous sulfate  325 mg Oral Q breakfast  . folic acid  1 mg Oral Daily  . [START ON 05/26/2014] Influenza vac split quadrivalent PF  0.5 mL Intramuscular Tomorrow-1000  . ipratropium-albuterol  3 mL Nebulization QID  . LORazepam  0-4 mg Intravenous Q6H   Followed by  . [START  ON 05/27/2014] LORazepam  0-4 mg Intravenous Q12H  . multivitamin with minerals  1 tablet Oral Daily  . pantoprazole  40 mg Oral Daily  . piperacillin-tazobactam (ZOSYN)  IV  3.375 g Intravenous Q8H  . [START ON 05/26/2014] pneumococcal 23 valent vaccine  0.5 mL Intramuscular Tomorrow-1000  . propranolol  10 mg Oral TID  . sodium chloride  3 mL Intravenous Q12H  . thiamine  100 mg Oral Daily   Or  . thiamine  100 mg Intravenous Daily  . traZODone  50 mg Oral QHS  . vancomycin  500 mg Intravenous Q12H  . vitamin B-12  100 mcg Oral Daily    Continuous Infusions:   Past Medical History  Diagnosis Date  . Hypertension   . Hyperlipemia   . Emphysema   . Stroke   . Chronic pain   . Cirrhosis of liver   . Fall   . Dental injury   . Bone spur of other site     Left Foot  . Impaired mobility   . Total self care deficit   . Pneumonia   . Emphysema   . ETOH abuse   . Elevated LFTs   . Arthritis   . Anxiety   . Sinusitis   . Anemia     iron def.  . Hard of hearing   . Diabetes mellitus without complication   . COPD (chronic obstructive pulmonary disease)   . Depression   . Glaucoma   . Emphysema of lung   . Cataract     Past Surgical History  Procedure Laterality Date  . Colonoscopy  01/06/2012    Procedure: COLONOSCOPY;  Surgeon: Beryle Beams, MD;  Location: [facility] ENDOSCOPY;  Service: Endoscopy;  Laterality: N/A;  . Esophagogastroduodenoscopy  01/06/2012    Procedure: ESOPHAGOGASTRODUODENOSCOPY (EGD);  Surgeon: Beryle Beams, MD;  Location: [facility] ENDOSCOPY;  Service: Endoscopy;  Laterality: N/A;  . Esophagogastroduodenoscopy  01/09/2012    Procedure: ESOPHAGOGASTRODUODENOSCOPY (EGD);  Surgeon: Beryle Beams, MD;  Location: [facility] ENDOSCOPY;  Service: Endoscopy;  Laterality: N/A;  . Multiple extractions with alveoloplasty  03/29/2012    Procedure: MULTIPLE EXTRACION WITH ALVEOLOPLASTY;  Surgeon: Gae Bon, DDS;  Location: [place];  Service: Oral Surgery;  Laterality: Bilateral;   . Bone spur  2013    left spur  . Cataract extraction w/ intraocular lens implant    . Mouth surgery    . Colonoscopy      Ophir Badger Clinical Dietitian HQION:[phone]

## 2014-05-25 NOTE — ED Notes (Signed)
Patient resting quietly with eyes closed. Appears in no distress. Patient will open eyes by voice.

## 2014-05-25 NOTE — ED Notes (Signed)
Patient states he has been vomiting today but that usually everyday. Today, he states he is dizzy and shortness of breath.

## 2014-05-25 NOTE — Progress Notes (Signed)
Pt states he doesn't take Symbicort inhaler 80-4.52mcg everyday, nor does he use his Albuterol inhaler everyday. He states he takes the Symbicort every other day, and only uses the inhaler "when he feels like he's going to pass out". Per RT protocol assessment score of 4, scheduled tx are not indicated at this time. RT will give PRN when needed, and will reassess if necessary.

## 2014-05-25 NOTE — Progress Notes (Signed)
Patient admitted after midnight. Please see earlier admission note by Dr. Hal Hope.  Patient is 63 year old male with known history of alcohol abuse, presented to [facility] with weakness, poor oral intake. In ED noted to be severely hypernatremic which was thought to be secondary to dehydration in the setting of persistent alcohol use and poor oral intake. In addition he was noted to be febrile with possible developing sepsis of unclear etiology. Vancomycin and Zosyn started. We'll continue the same regimen, continue IV fluids. Advance diet as patient able to tolerate. Keeping stepdown. Repeat CBC and be met in the morning.  Faye Ramsay, MD  [facility] Pager [phone]  If 7PM-7AM, please contact night-coverage [email or web address] Password [email or web address]

## 2014-05-25 NOTE — ED Notes (Signed)
Bed: FH54 Expected date: 05/25/14 Expected time: 12:02 AM Means of arrival: Ambulance Comments: 63 yo M  Weakness

## 2014-05-25 NOTE — ED Notes (Addendum)
Per EMS, call came out as altered mental status. When EMS arrived, patient is alert, oriented x 4. Patient lives alone but his brother comes to check on patient every night. When EMS arrived, patients brother was cleaning patient up since pt is incontinent. Pt does have a history drinking ETOH. Pt reported to EMS he had drink 2 40oz beers, which pt's brother reports is less than he normally drinks. Pt stated he was weak when ambulating at his residents. Also, reports he is noncompliant with taking medications as prescribed.

## 2014-05-26 DIAGNOSIS — A419 Sepsis, unspecified organism: Secondary | ICD-10-CM | POA: Diagnosis not present

## 2014-05-26 LAB — BASIC METABOLIC PANEL
ANION GAP: 16 — AB (ref 5–15)
BUN: 18 mg/dL (ref 6–23)
CALCIUM: 9.1 mg/dL (ref 8.4–10.5)
CO2: 20 mEq/L (ref 19–32)
Chloride: 92 mEq/L — ABNORMAL LOW (ref 96–112)
Creatinine, Ser: 1.26 mg/dL (ref 0.50–1.35)
GFR calc Af Amer: 69 mL/min — ABNORMAL LOW (ref >90)
GFR calc non Af Amer: 60 mL/min — ABNORMAL LOW (ref >90)
Glucose, Bld: 89 mg/dL (ref 70–99)
Potassium: 3.4 mEq/L — ABNORMAL LOW (ref 3.7–5.3)
SODIUM: 128 meq/L — AB (ref 137–147)

## 2014-05-26 LAB — CBC
HCT: 23.4 % — ABNORMAL LOW (ref 39.0–52.0)
Hemoglobin: 8.2 g/dL — ABNORMAL LOW (ref 13.0–17.0)
MCH: 27.4 pg (ref 26.0–34.0)
MCHC: 35 g/dL (ref 30.0–36.0)
MCV: 78.3 fL (ref 78.0–100.0)
PLATELETS: 97 10*3/uL — AB (ref 150–400)
RBC: 2.99 MIL/uL — ABNORMAL LOW (ref 4.22–5.81)
RDW: 15.5 % (ref 11.5–15.5)
WBC: 3.5 10*3/uL — AB (ref 4.0–10.5)

## 2014-05-26 LAB — URINE CULTURE: Colony Count: >=100000

## 2014-05-26 MED ORDER — IPRATROPIUM-ALBUTEROL 0.5-2.5 (3) MG/3ML IN SOLN
3.0000 mL | Freq: Four times a day (QID) | RESPIRATORY_TRACT | Status: DC
  Administered 2014-05-26 – 2014-05-28 (×6): 3 mL via RESPIRATORY_TRACT
  Filled 2014-05-26 (×6): qty 3

## 2014-05-26 MED ORDER — IPRATROPIUM-ALBUTEROL 0.5-2.5 (3) MG/3ML IN SOLN: 3.0000 mL | RESPIRATORY_TRACT | Status: DC | PRN

## 2014-05-26 MED ORDER — POTASSIUM CHLORIDE CRYS ER 20 MEQ PO TBCR: 40.0000 meq | EXTENDED_RELEASE_TABLET | Freq: Once | ORAL | Status: DC

## 2014-05-26 NOTE — Plan of Care (Signed)
Problem: Phase I Progression Outcomes Goal: Pain controlled with appropriate interventions Outcome: Progressing     

## 2014-05-26 NOTE — Progress Notes (Signed)
Patient ID: Peter [name], male   DOB: 09/01/1950, 63 y.o.   MRN: [id number]  [facility] PROGRESS NOTE  Peter [name] MRN:[id number] DOB: 12/15/1950 DOA: 05/25/2014 PCP: ADVANI, DEEPAK, MD  Brief narrative:  HPI:  Pt is 63 y.o. male with history of alcoholism who lives with his brother was brought to the ER after patient was found to be weak with chills and rigors. Patient mother states that patient was found to have sweating and chills. Patient drinks alcohol everyday. Denies any chest pain or shortness of breath, no abdominal pain or diarrhea. Patient is as per mother noncompliant with his medications. In the ER patient was found to be febrile with labs showing severe hyponatremia and sodium was around 116. Lactic acid was elevated. ER physician had given patient 2 L of normal saline bolus and blood cultures were sent and patient was started on broad spectrum ABX. Pt has history of VDRF.  Assessment and Plan:    Active Problems:   Acute alcohol intoxication   Severe sepsis - criteria met with T 101.9 F, tachycardia with HR 131, hypotension BP 73/36, RR > 28 bpm, elevated lactic acid  - source initially unclear, CXR with no signs of PNA but UA suggestive of UTI - will continue broad spectrum ABX for no until blood and urine cultures finalized - continue to provide supportive care with IVF, analgesia, antiemetics as needed - keep in SDU today    Acute on chronic pancreatitis - alcohol induced  - pt tolerating soft diet well - check lipase in AM   Transaminitis - from Alcohol use - check LFT is in AM   COPD (chronic obstructive pulmonary disease) - clinically compensated at this time - pt is maintaining oxygen saturation at target range on 2 L [place] - will plan on tapering to RA today - continue to take BD scheduled and as needed    Hyponatremia - secondary to dehydration in the setting of alcohol use - Na trending up: 125 --> 128 - repeat BMP In AM   Cirrhosis - check LFT  in am   Pancytopenia - secondary to alcohol induced bone marrow damage - monitor counts closely  - cbc in AM   Severe PCM - secondary to poor oral intake and heavy alcohol abuse - encouraged PO intake - nutritionist consulted    Hypokalemia - mild, will supplement and repeat BMP in AM   Alcohol abuse - will provide consultation on cessation once pt able to participate     DVT prophylaxis  Lovenox SQ while pt is in hospital  Code Status: Full Family Communication: Pt at bedside Disposition Plan: Keep in SDU   IV Access:   Peripheral IV Procedures and diagnostic studies:   Dg Chest 2 View  05/25/2014  Shallow inspiration with atelectasis in the lung bases.   Ct Head Wo Contrast  05/25/2014   Findings consistent with right maxillary, left frontal and bilateral ethmoid sinusitis. Mild diffuse cortical atrophy. Minimal chronic ischemic white matter disease. No acute intracranial abnormality seen.     Medical Consultants:   None Other Consultants:   Physical therapy  Anti-Infectives:   Vancomycin 11/19 --> Zosyn 11/19 -->  MAGICK-Ailanie Ruttan, MD  [facility] Pager [phone]  If 7PM-7AM, please contact night-coverage [email or web address] Password [email or web address] 05/26/2014, 5:00 PM   LOS: 1 day   HPI/Subjective: No events overnight.   Objective: Filed Vitals:   05/26/14 1300 05/26/14 1500 05/26/14 1518 05/26/14 1600  BP: 88/53 77/63 102/62 111/56  Pulse:   102 107 108 103  Temp: 98.6 F (37 C)     TempSrc: Oral     Resp: _0 Height:      Weight:      SpO2: 96% 97%  98%    Intake/Output Summary (Last 24 hours) at 05/26/14 1700 Last data filed at 05/26/14 1600  Gross per 24 hour  Intake   1875 ml  Output   1185 ml  Net    690 ml    Exam:   General:  Pt is alert, follows commands appropriately, not in acute distress  Cardiovascular: Regular rate and rhythm,  no rubs, no gallops  Respiratory: Clear to auscultation bilaterally, no wheezing, diminished breath sounds at  base   Abdomen: Soft, non tender, non distended, bowel sounds present, no guarding  Data Reviewed: Basic Metabolic Panel:  Recent Labs Lab 05/25/14 0810 05/25/14 1210 05/25/14 1620 05/25/14 1949 05/26/14 0335  NA 125* 125* 125* 125* 128*  K 4.0 3.7 3.6* 3.5* 3.4*  CL 87* 87* 89* 89* 92*  CO2 _1 17* 20  GLUCOSE 79 98 88 122* 89  BUN _2 CREATININE 1.13 1.13 1.14 1.35 1.26  CALCIUM 9.2 8.8 8.9 8.9 9.1   Liver Function Tests:  Recent Labs Lab 05/25/14 0145 05/25/14 0810  AST 99* 81*  ALT 46 39  ALKPHOS 107 99  BILITOT 0.4 0.5  PROT 8.7* 8.0  ALBUMIN 4.0 3.7    Recent Labs Lab 05/25/14 0145  LIPASE 157*    Recent Labs Lab 05/25/14 0810  AMMONIA 42   CBC:  Recent Labs Lab 05/25/14 0145 05/25/14 0223 05/25/14 0810 05/26/14 0335  WBC 4.4  --  2.9* 3.5*  NEUTROABS  --   --  1.9  --   HGB 9.5* 11.6* 9.0* 8.2*  HCT 27.5* 34.0* 26.1* 23.4*  MCV 76.4*  --  76.5* 78.3  PLT 95*  --  100* 97*   Cardiac Enzymes:  Recent Labs Lab 05/25/14 0810  CKTOTAL 310*  TROPONINI <0.30   BNP: Invalid input(s): POCBNP CBG:  Recent Labs Lab 05/25/14 0042  GLUCAP 74    Recent Results (from the past 240 hour(s))  Blood culture (routine x 2)     Status: None (Preliminary result)   Collection Time: 05/25/14  1:45 AM  Result Value Ref Range Status   Specimen Description BLOOD RIGHT ANTECUBITAL  Final   Special Requests BOTTLES DRAWN AEROBIC AND ANAEROBIC 3 ML EACH  Final   Culture  Setup Time   Final    05/25/2014 12:41 Performed at [facility]    Culture   Final           BLOOD CULTURE RECEIVED NO GROWTH TO DATE CULTURE WILL BE HELD FOR 5 DAYS BEFORE ISSUING A FINAL NEGATIVE REPORT Performed at [facility]    Report Status PENDING  Incomplete  Blood culture (routine x 2)     Status: None (Preliminary result)   Collection Time: 05/25/14  1:45 AM  Result Value Ref Range Status   Specimen Description BLOOD  L HAND  Final    Special Requests BOTTLES DRAWN AEROBIC AND ANAEROBIC 5 CC EACH  Final   Culture  Setup Time   Final    05/25/2014 12:41 Performed at [place]   Final           BLOOD CULTURE RECEIVED NO GROWTH TO DATE CULTURE WILL BE HELD  FOR 5 DAYS BEFORE ISSUING A FINAL NEGATIVE REPORT Performed at [facility]    Report Status PENDING  Incomplete  Urine culture     Status: None   Collection Time: 05/25/14  3:12 AM  Result Value Ref Range Status   Specimen Description URINE, CATHETERIZED  Final   Special Requests NONE  Final   Culture  Setup Time   Final    05/25/2014 12:40 Performed at [place]   Final    >=100,000 COLONIES/ML Performed at [facility]    Culture   Final    LACTOBACILLUS SPECIES Note: Standardized susceptibility testing for this organism is not available. Performed at [facility]    Report Status 05/26/2014 FINAL  Final  MRSA PCR Screening     Status: None   Collection Time: 05/25/14  6:32 AM  Result Value Ref Range Status   MRSA by PCR NEGATIVE NEGATIVE Final    Comment:        The GeneXpert MRSA Assay (FDA approved for NASAL specimens only), is one component of a comprehensive MRSA colonization surveillance program. It is not intended to diagnose MRSA infection nor to guide or monitor treatment for MRSA infections.      Scheduled Meds: . atorvastatin  80 mg Oral Daily  . brimonidine  1 drop Both Eyes BID  . budesonide-formoterol  2 puff Inhalation BID  . enoxaparin (LOVENOX) injection  40 mg Subcutaneous Q24H  . feeding supplement (ENSURE)  1 Container Oral BID BM  . ferrous sulfate  325 mg Oral Q breakfast  . folic acid  1 mg Oral Daily  . LORazepam  0-4 mg Intravenous Q6H   Followed by  . [START ON 05/27/2014] LORazepam  0-4 mg Intravenous Q12H  . multivitamin with minerals  1 tablet Oral Daily  . pantoprazole  40 mg Oral Daily  . piperacillin-tazobactam (ZOSYN)  IV  3.375  g Intravenous Q8H  . propranolol  10 mg Oral TID  . sodium chloride  3 mL Intravenous Q12H  . thiamine  100 mg Oral Daily   Or  . thiamine  100 mg Intravenous Daily  . traZODone  50 mg Oral QHS  . vancomycin  500 mg Intravenous Q12H  . vitamin B-12  100 mcg Oral Daily   Continuous Infusions: . sodium chloride 75 mL/hr at 05/25/14 1240

## 2014-05-27 LAB — COMPREHENSIVE METABOLIC PANEL
ALBUMIN: 3.1 g/dL — AB (ref 3.5–5.2)
ALT: 23 U/L (ref 0–53)
ANION GAP: 13 (ref 5–15)
AST: 37 U/L (ref 0–37)
Alkaline Phosphatase: 66 U/L (ref 39–117)
BUN: 10 mg/dL (ref 6–23)
CALCIUM: 8.9 mg/dL (ref 8.4–10.5)
CO2: 21 mEq/L (ref 19–32)
CREATININE: 1.06 mg/dL (ref 0.50–1.35)
Chloride: 94 mEq/L — ABNORMAL LOW (ref 96–112)
GFR calc Af Amer: 86 mL/min — ABNORMAL LOW (ref >90)
GFR, EST NON AFRICAN AMERICAN: 74 mL/min — AB (ref >90)
Glucose, Bld: 110 mg/dL — ABNORMAL HIGH (ref 70–99)
Potassium: 3 mEq/L — ABNORMAL LOW (ref 3.7–5.3)
Sodium: 128 mEq/L — ABNORMAL LOW (ref 137–147)
Total Bilirubin: 0.5 mg/dL (ref 0.3–1.2)
Total Protein: 6.8 g/dL (ref 6.0–8.3)

## 2014-05-27 LAB — CBC
HCT: 23.3 % — ABNORMAL LOW (ref 39.0–52.0)
HEMOGLOBIN: 7.9 g/dL — AB (ref 13.0–17.0)
MCH: 26.7 pg (ref 26.0–34.0)
MCHC: 33.9 g/dL (ref 30.0–36.0)
MCV: 78.7 fL (ref 78.0–100.0)
Platelets: 100 10*3/uL — ABNORMAL LOW (ref 150–400)
RBC: 2.96 MIL/uL — AB (ref 4.22–5.81)
RDW: 15.4 % (ref 11.5–15.5)
WBC: 6.1 10*3/uL (ref 4.0–10.5)

## 2014-05-27 LAB — LIPASE, BLOOD: Lipase: 89 U/L — ABNORMAL HIGH (ref 11–59)

## 2014-05-27 LAB — VANCOMYCIN, TROUGH: Vancomycin Tr: 8.4 ug/mL — ABNORMAL LOW (ref 10.0–20.0)

## 2014-05-27 LAB — MAGNESIUM: MAGNESIUM: 1.4 mg/dL — AB (ref 1.5–2.5)

## 2014-05-27 MED ORDER — LORAZEPAM 2 MG/ML IJ SOLN
1.0000 mg | Freq: Four times a day (QID) | INTRAMUSCULAR | Status: AC | PRN
  Administered 2014-05-27 – 2014-05-28 (×4): 1 mg via INTRAVENOUS
  Filled 2014-05-27 (×4): qty 1

## 2014-05-27 MED ORDER — FUROSEMIDE 10 MG/ML IJ SOLN
40.0000 mg | Freq: Once | INTRAMUSCULAR | Status: AC
  Administered 2014-05-27: 40 mg via INTRAVENOUS
  Filled 2014-05-27 (×2): qty 4

## 2014-05-27 MED ORDER — THIAMINE HCL 100 MG/ML IJ SOLN: 100.0000 mg | Freq: Every day | INTRAMUSCULAR | Status: DC

## 2014-05-27 MED ORDER — VANCOMYCIN HCL IN DEXTROSE 750-5 MG/150ML-% IV SOLN
750.0000 mg | Freq: Two times a day (BID) | INTRAVENOUS | Status: DC
Start: 2014-05-27 — End: 2014-05-28
  Administered 2014-05-28 (×2): 750 mg via INTRAVENOUS
  Filled 2014-05-27 (×3): qty 150

## 2014-05-27 MED ORDER — FOLIC ACID 1 MG PO TABS: 1.0000 mg | ORAL_TABLET | Freq: Every day | ORAL | Status: DC

## 2014-05-27 MED ORDER — POTASSIUM CHLORIDE 20 MEQ/15ML (10%) PO SOLN
40.0000 meq | Freq: Two times a day (BID) | ORAL | Status: AC
  Administered 2014-05-27 – 2014-05-28 (×2): 40 meq via ORAL
  Filled 2014-05-27 (×2): qty 30

## 2014-05-27 MED ORDER — ADULT MULTIVITAMIN W/MINERALS CH: 1.0000 | ORAL_TABLET | Freq: Every day | ORAL | Status: DC

## 2014-05-27 MED ORDER — VITAMIN B-1 100 MG PO TABS: 100.0000 mg | ORAL_TABLET | Freq: Every day | ORAL | Status: DC

## 2014-05-27 MED ORDER — POTASSIUM CHLORIDE CRYS ER 20 MEQ PO TBCR
40.0000 meq | EXTENDED_RELEASE_TABLET | Freq: Two times a day (BID) | ORAL | Status: DC
  Filled 2014-05-27: qty 2

## 2014-05-27 MED ORDER — LORAZEPAM 1 MG PO TABS
1.0000 mg | ORAL_TABLET | Freq: Four times a day (QID) | ORAL | Status: AC | PRN
  Administered 2014-05-28: 1 mg via ORAL
  Filled 2014-05-27: qty 1

## 2014-05-27 MED ORDER — VANCOMYCIN HCL IN DEXTROSE 750-5 MG/150ML-% IV SOLN
750.0000 mg | Freq: Two times a day (BID) | INTRAVENOUS | Status: DC
  Filled 2014-05-27: qty 150

## 2014-05-27 NOTE — Progress Notes (Signed)
ANTIBIOTIC CONSULT NOTE - FOLLOW UP  Pharmacy Consult for Vancomycin, Zosyn Indication: rule out sepsis  Allergies  Allergen Reactions  . Poison Ivy Extract [Extract Of Poison Ivy] Rash    Patient Measurements: Height: 5\' 7"  (170.2 cm) Weight: 139 lb 12.8 oz (63.413 kg) IBW/kg (Calculated) : 66.1  Vital Signs: Temp: 100.8 F (38.2 C) (11/21 1024) Temp Source: Oral (11/21 1024) BP: 105/66 mmHg (11/21 1024) Pulse Rate: 95 (11/21 1024)  Labs:  Recent Labs  05/25/14 0810  05/25/14 1949 05/26/14 0335 05/27/14 0334  WBC 2.9*  --   --  3.5* 6.1  HGB 9.0*  --   --  8.2* 7.9*  PLT 100*  --   --  97* 100*  CREATININE 1.13  < > 1.35 1.26 1.06  < > = values in this interval not displayed. Estimated Creatinine Clearance: 65.6 mL/min (by C-G formula based on Cr of 1.06).  Recent Labs  05/27/14 1157  VANCOTROUGH 8.4*    Microbiology Data: 11/19: Blood x 2: NGtd 11/19: Urine: >100K Lactobacillus sp. 11/19: influenza panel: negative  Antimicrobial Drugs: 11/19 >> Vanc >> 11/19 >> Zosyn >>  Assessment: 70 yoM admitted 11/19 with alcohol intoxication, fever, weakness, chills, rigors, and (+) SIRS criteria. PMH is significant for sepsis (Metapneumovirus and Haemophilus Influenza positive 11/2013)and ventilation for respiratory failure, but patient does also have acute on chronic pancreatitis.  Pharmacy was consulted to dose Vancomycin and Zosyn.  Today, 05/27/2014   D#3 Vancomycin 1g IV x 1 then 500mg  q12h / Zosyn 3.375 grams IV q8h (extended-infusion).  Remains febrile - Tmax: 101.3  Leukopenia resolved  Azotemia resolved - SCr improved to WNL (1.06), est CrCl 66 mL/min.  Vancomycin trough subtherapeutic (8.4)   Goal of Therapy:  Vancomycin trough level 15-20 mcg/ml Appropriate abx dosing, eradication of infection.  Plan:  1.  Increase vancomycin to 750 mg IV q12h 2.  Continue Zosyn 3.375 grams IV q8h (extended-infusion) 3.  Follow serum creatinine, cultures,  clinical course.  Clayburn Pert, PharmD, BCPS Pager: [phone] 05/27/2014  1:18 PM

## 2014-05-27 NOTE — Progress Notes (Addendum)
Patient ID: Peter [name], male   DOB: 23-Apr-1951, 63 y.o.   MRN: [id number]  [facility] PROGRESS NOTE  Peter [name] LXB:[id number] DOB: [birth year] DOA: 05/25/2014 PCP: Lorayne Marek, MD   HPI:  Pt is 63 y.o. male with history of alcoholism who lives with his brother was brought to the ER after patient was found to be weak with chills and rigors. Patient mother states that patient was found to have sweating and chills. Patient drinks alcohol everyday. Denies any chest pain or shortness of breath, no abdominal pain or diarrhea. Patient is as per mother noncompliant with his medications. In the ER patient was found to be febrile with labs showing severe hyponatremia and sodium was around 116. Lactic acid was elevated. ER physician had given patient 2 L of normal saline bolus and blood cultures were sent and patient was started on broad spectrum ABX. Pt has history of VDRF.  Assessment and Plan:    Active Problems:  Severe sepsis - criteria met with T 101.9 F, tachycardia with HR 131, hypotension BP 73/36, RR > 28 bpm, elevated lactic acid  - source initially unclear, CXR with no signs of PNA but UA suggestive of UTI - will continue broad spectrum ABX for no until blood and urine cultures finalized as pt is still febrile with Tmax 101.3 F - continue to provide supportive care with analgesia, antiemetics as needed - stop IVF as pt with more crackles on exam this AM, 11/21 - transfer to telemetry bed    Acute respiratory failure in pt with underlying COPD  - more crackles on exam this AM - pt with known combined systolic and diastolic CHF - stop IVF (41/63), daily weights (today's weight 142 lbs), strict I's and O's   Acute on chronic combined systolic and diastolic CHF - last 2 D ECHO 10/2013 with EF 30 - 35% and grade I diastolic dysfunction - stop IVF, strict I's and O's, no need for repeat 2 D ECHO as it was recently done - monitor clinical response after providing one dose of  Lasix 40 mg IV (11/21)  Acute on chronic pancreatitis - alcohol induced  - pt tolerating soft diet well - lipase is trending down   Transaminitis - from Alcohol use - LFT's now WNL   COPD (chronic obstructive pulmonary disease) - clinically compensated at this time - pt is maintaining oxygen saturation at target range on 2 L [place] - will plan on tapering to RA today - continue to take BD scheduled and as needed   Hyponatremia - secondary to dehydration in the setting of alcohol use - Na trending up: 125 --> 128 and remains stable at 128  - repeat BMP In AM   HTN - slightly on the soft side this AM - pt is on Metoprolol, watch carefully as will give one dose of Lasix 40 mg IV   Cirrhosis - from alcohol abuse   Pancytopenia - secondary to alcohol induced bone marrow damage - platelets and WBC WNL but Hg down and likely dilutional from IVF pt has been receiving  - will stop IVF as noted above (11/21) - cbc in AM  Severe PCM - secondary to poor oral intake and heavy alcohol abuse - encouraged PO intake - nutritionist consulted   Hypokalemia - continue to supplement and repeat BMP in AM - check Mg level   Alcohol abuse - will provide consultation on cessation once pt able to participate  - continue to keep on CIWA  protocol, stable for telemetry bed  - continue MVI, thiamine, folic acid   DVT prophylaxis  Lovenox SQ while pt is in hospital, will change to SCD's due to drop in Hg, 11/20  Code Status: Full Family Communication: Pt at bedside Disposition Plan: Transfer to telemetry    IV Access:    Peripheral IV Procedures and diagnostic studies:    Dg Chest 2 View 05/25/2014 Shallow inspiration with atelectasis in the lung bases.   Ct Head Wo Contrast 05/25/2014 Findings consistent with right maxillary, left frontal and bilateral ethmoid sinusitis. Mild diffuse cortical atrophy. Minimal chronic ischemic white matter disease. No acute intracranial  abnormality seen.  Medical Consultants:    None Other Consultants:    Physical therapy/OT requested 11/20 Anti-Infectives:    Vancomycin 11/19 -->  Zosyn 11/19 -->  Faye Ramsay, MD  [facility] Pager [phone]  If 7PM-7AM, please contact night-coverage [email or web address] Password [email or web address] 05/27/2014, 7:49 AM   LOS: 2 days   HPI/Subjective: No events overnight.   Objective: Filed Vitals:   05/27/14 0500 05/27/14 0600 05/27/14 0700 05/27/14 0747  BP:      Pulse: 103 105 103   Temp:    101.3 F (38.5 C)  TempSrc:    Oral  Resp: _0 Height:      Weight:      SpO2: 98% 100% 97%     Intake/Output Summary (Last 24 hours) at 05/27/14 0749 Last data filed at 05/27/14 0700  Gross per 24 hour  Intake   1805 ml  Output   1320 ml  Net    485 ml    Exam:   General:  Pt is alert, follows commands appropriately, not in acute distress  Cardiovascular: Regular rhythm, tachycardic, S1/S2, no murmurs, no rubs, no gallops  Respiratory: Bibasilar crackles with diminished breath sounds at bases   Abdomen: Soft, non tender, non distended, bowel sounds present, no guarding  Extremities: pulses DP and PT palpable bilaterally, + 1 bilateral LE pitting edema   Neuro: Grossly non focal, still with tremors   Data Reviewed: Basic Metabolic Panel:  Recent Labs Lab 05/25/14 1210 05/25/14 1620 05/25/14 1949 05/26/14 0335 05/27/14 0334  NA 125* 125* 125* 128* 128*  K 3.7 3.6* 3.5* 3.4* 3.0*  CL 87* 89* 89* 92* 94*  CO2 19 20 17* 20 21  GLUCOSE 98 88 122* 89 110*  BUN _1 CREATININE 1.13 1.14 1.35 1.26 1.06  CALCIUM 8.8 8.9 8.9 9.1 8.9   Liver Function Tests:  Recent Labs Lab 05/25/14 0145 05/25/14 0810 05/27/14 0334  AST 99* 81* 37  ALT 46 39 23  ALKPHOS 107 99 66  BILITOT 0.4 0.5 0.5  PROT 8.7* 8.0 6.8  ALBUMIN 4.0 3.7 3.1*    Recent Labs Lab 05/25/14 0145 05/27/14 0334  LIPASE 157* 89*    Recent Labs Lab 05/25/14 0810  AMMONIA  42   CBC:  Recent Labs Lab 05/25/14 0145 05/25/14 0223 05/25/14 0810 05/26/14 0335 05/27/14 0334  WBC 4.4  --  2.9* 3.5* 6.1  NEUTROABS  --   --  1.9  --   --   HGB 9.5* 11.6* 9.0* 8.2* 7.9*  HCT 27.5* 34.0* 26.1* 23.4* 23.3*  MCV 76.4*  --  76.5* 78.3 78.7  PLT 95*  --  100* 97* 100*   Cardiac Enzymes:  Recent Labs Lab 05/25/14 0810  CKTOTAL 310*  TROPONINI <0.30   BNP: Invalid input(s): POCBNP CBG:  Recent Labs  Lab 05/25/14 0042  GLUCAP 74    Recent Results (from the past 240 hour(s))  Blood culture (routine x 2)     Status: None (Preliminary result)   Collection Time: 05/25/14  1:45 AM  Result Value Ref Range Status   Specimen Description BLOOD RIGHT ANTECUBITAL  Final   Special Requests BOTTLES DRAWN AEROBIC AND ANAEROBIC 3 ML EACH  Final   Culture  Setup Time   Final    05/25/2014 12:41 Performed at [facility]    Culture   Final           BLOOD CULTURE RECEIVED NO GROWTH TO DATE CULTURE WILL BE HELD FOR 5 DAYS BEFORE ISSUING A FINAL NEGATIVE REPORT Performed at [facility]    Report Status PENDING  Incomplete  Blood culture (routine x 2)     Status: None (Preliminary result)   Collection Time: 05/25/14  1:45 AM  Result Value Ref Range Status   Specimen Description BLOOD  L HAND  Final   Special Requests BOTTLES DRAWN AEROBIC AND ANAEROBIC 5 CC EACH  Final   Culture  Setup Time   Final    05/25/2014 12:41 Performed at [facility]    Culture   Final           BLOOD CULTURE RECEIVED NO GROWTH TO DATE CULTURE WILL BE HELD FOR 5 DAYS BEFORE ISSUING A FINAL NEGATIVE REPORT Performed at [facility]    Report Status PENDING  Incomplete  Urine culture     Status: None   Collection Time: 05/25/14  3:12 AM  Result Value Ref Range Status   Specimen Description URINE, CATHETERIZED  Final   Special Requests NONE  Final   Culture  Setup Time   Final    05/25/2014 12:40 Performed at [place]   Final    >=100,000 COLONIES/ML Performed at [facility]    Culture   Final    LACTOBACILLUS SPECIES Note: Standardized susceptibility testing for this organism is not available. Performed at [facility]    Report Status 05/26/2014 FINAL  Final  MRSA PCR Screening     Status: None   Collection Time: 05/25/14  6:32 AM  Result Value Ref Range Status   MRSA by PCR NEGATIVE NEGATIVE Final    Comment:        The GeneXpert MRSA Assay (FDA approved for NASAL specimens only), is one component of a comprehensive MRSA colonization surveillance program. It is not intended to diagnose MRSA infection nor to guide or monitor treatment for MRSA infections.      Scheduled Meds: . atorvastatin  80 mg Oral Daily  . brimonidine  1 drop Both Eyes BID  . budesonide-formoterol  2 puff Inhalation BID  . enoxaparin (LOVENOX) injection  40 mg Subcutaneous Q24H  . feeding supplement (ENSURE)  1 Container Oral BID BM  . ferrous sulfate  325 mg Oral Q breakfast  . folic acid  1 mg Oral Daily  . ipratropium-albuterol  3 mL Nebulization QID  . LORazepam  0-4 mg Intravenous Q12H  . multivitamin with minerals  1 tablet Oral Daily  . pantoprazole  40 mg Oral Daily  . piperacillin-tazobactam (ZOSYN)  IV  3.375 g Intravenous Q8H  . potassium chloride  40 mEq Oral Once  . propranolol  10 mg Oral TID  . sodium chloride  3 mL Intravenous Q12H  . thiamine  100 mg Oral Daily  Or  . thiamine  100 mg Intravenous Daily  . traZODone  50 mg Oral QHS  . vancomycin  500 mg Intravenous Q12H  . vitamin B-12  100 mcg Oral Daily   Continuous Infusions: . sodium chloride 75 mL/hr at 05/25/14 1240

## 2014-05-27 NOTE — Progress Notes (Addendum)
Occupational Therapy Evaluation Patient Details Name: Peter [name] MRN: [id number] DOB: 02-28-1951 Today's Date: 11/21/[name]    History of Present Illness 63 yo with hx of alcoholism admitted with fever/chills. Per MD pt with severe sepsis, ARF.  CHF; pancreatitis and hyponatrimia.   Clinical Impression   PTA, pt lived at home with friend and was mod I with ADL and mobility. Pt's mother present for eval and states her son was ""barely able to care for himself". Pt confused and lethargic. Mother attempting to feed pt while reclined and lethargic. Educated mother to not attemtp to feed pt unless he was alert and in upright position. Total A+2 for bed mobility and ADL. Recommend SNF at D/C. Mother would like facility on [place] if possible.Will follow acutely to address established goals.     Follow Up Recommendations  SNF;Supervision/Assistance - 24 hour    Equipment Recommendations  Other (comment) (TBA at SNF)    Recommendations for Other Services       Precautions / Restrictions Precautions Precautions: Fall      Mobility Bed Mobility Overal bed mobility: Needs Assistance;+2 for physical assistance Bed Mobility: Rolling Rolling: +2 for physical assistance;Total assist         General bed mobility comments: attempted rolling. Pt not initiating any movement. attempted to facilitate, but pt resisting.Assisted nursing to clean pt due to incontinence  Transfers Overall transfer level: Needs assistance               General transfer comment: will need +2 assist. unable to attempt this date    Balance                                            ADL Overall ADL's : Needs assistance/impaired                                     Functional mobility during ADLs: +2 for physical assistance;+2 for safety/equipment General ADL Comments: total Assist with all ADL. Pt's mother trying to feed pt pudding while pt reclined in bed. Pt  coughing and spit out bolus. Educated mother on need to refrain from attempting to feed pt when hs is so lethargic and supine in bed due to risk of aspiration. Attempted bed mobility with pt. Pt unable to follow commands to assist pt. Pt completely urine soaked - condom cath not on ? if pt pulled off. nsg aware.     Vision                     Perception     Praxis      Pertinent Vitals/Pain Pain Assessment: Faces Faces Pain Scale: Hurts little more Pain Location: unable to identify Pain Descriptors / Indicators: Grimacing Pain Intervention(s): Monitored during session     Hand Dominance Right   Extremity/Trunk Assessment Upper Extremity Assessment Upper Extremity Assessment: RUE deficits/detail;LUE deficits/detail RUE Deficits / Details: moving RUE spontaneously. limited shoulder movemetn LUE Deficits / Details: holding L hand in flexed position. unable to get pt to fully extend L hand or lift shoulder    Lower Extremity Assessment Lower Extremity Assessment: Generalized weakness;Defer to PT evaluation       Communication Communication Communication: Other (comment) (mumbling)   Cognition Arousal/Alertness: Lethargic Behavior During Therapy: Flat affect;Restless Overall Cognitive  Status: Impaired/Different from baseline Area of Impairment: Orientation;Attention;Following commands;Awareness Orientation Level: Disoriented to;Place;Time;Situation Current Attention Level: Focused Memory: Decreased recall of precautions;Decreased short-term memory Following Commands: Follows one step commands inconsistently       General Comments: Pt apparently confused; garbled speech   General Comments       Exercises       Shoulder Instructions      Home Living Family/patient expects to be discharged to:: Private residence Living Arrangements: Non-relatives/Friends Available Help at Discharge: Available 24 hours/day;Family Type of Home: House Home Access: Stairs to  enter [employer] of Steps: 4   Home Layout: One level     Bathroom Shower/Tub: Corporate investment banker: Standard Bathroom Accessibility: Yes How Accessible: Accessible via walker Home Equipment: [place] - 2 wheels;Cane - single point;Tub bench   Additional Comments: unsure of information      Prior Functioning/Environment Level of Independence: Independent with assistive device(s)        Comments: uses cane mostly; mother states his friend helped him out as needed    OT Diagnosis: Generalized weakness;Cognitive deficits;Altered mental status   OT Problem List: Decreased strength;Decreased range of motion;Decreased activity tolerance;Impaired balance (sitting and/or standing);Decreased coordination;Decreased cognition;Decreased safety awareness;Decreased knowledge of use of DME or AE;Impaired UE functional use   OT Treatment/Interventions: Self-care/ADL training;Therapeutic exercise;DME and/or AE instruction;Therapeutic activities;Cognitive remediation/compensation;Patient/family education;Balance training    OT Goals(Current goals can be found in the care plan section) Acute Rehab OT Goals Patient Stated Goal: unable to state OT Goal Formulation: Patient unable to participate in goal setting Time For Goal Achievement: 06/10/14 Potential to Achieve Goals: Fair  OT Frequency: Min 2X/week   Barriers to D/C: Decreased caregiver support          Co-evaluation              End of Session Nurse Communication: Mobility status;Other (comment) (incontinence)  Activity Tolerance: Patient limited by lethargy Patient left: in bed;with call bell/phone within reach;with bed alarm set;with family/visitor present   Time: 1440-1503 OT Time Calculation (min): 23 min Charges:  OT General Charges $OT Visit: 1 Procedure OT Evaluation $Initial OT Evaluation Tier I: 1 Procedure OT Treatments $Self Care/Home Management : 8-22 mins G-Codes:     [name],Peter [name], 3:04 PM   [facility], OTR/L  [phone] 21-Dec-[name]

## 2014-05-28 LAB — BASIC METABOLIC PANEL
ANION GAP: 18 — AB (ref 5–15)
BUN: 9 mg/dL (ref 6–23)
CALCIUM: 9.4 mg/dL (ref 8.4–10.5)
CHLORIDE: 95 meq/L — AB (ref 96–112)
CO2: 19 mEq/L (ref 19–32)
Creatinine, Ser: 0.93 mg/dL (ref 0.50–1.35)
GFR calc non Af Amer: 89 mL/min — ABNORMAL LOW (ref >90)
Glucose, Bld: 103 mg/dL — ABNORMAL HIGH (ref 70–99)
Potassium: 4.3 mEq/L (ref 3.7–5.3)
Sodium: 132 mEq/L — ABNORMAL LOW (ref 137–147)

## 2014-05-28 LAB — PRO B NATRIURETIC PEPTIDE: PRO B NATRI PEPTIDE: 6527 pg/mL — AB (ref 0–125)

## 2014-05-28 LAB — CBC
HCT: 27.1 % — ABNORMAL LOW (ref 39.0–52.0)
Hemoglobin: 8.9 g/dL — ABNORMAL LOW (ref 13.0–17.0)
MCH: 26 pg (ref 26.0–34.0)
MCHC: 32.8 g/dL (ref 30.0–36.0)
MCV: 79.2 fL (ref 78.0–100.0)
PLATELETS: 133 10*3/uL — AB (ref 150–400)
RBC: 3.42 MIL/uL — ABNORMAL LOW (ref 4.22–5.81)
RDW: 15.4 % (ref 11.5–15.5)
WBC: 5.5 10*3/uL (ref 4.0–10.5)

## 2014-05-28 LAB — MAGNESIUM: Magnesium: 1.5 mg/dL (ref 1.5–2.5)

## 2014-05-28 MED ORDER — ENSURE COMPLETE PO LIQD
237.0000 mL | Freq: Two times a day (BID) | ORAL | Status: DC
  Administered 2014-05-28 – 2014-05-31 (×4): 237 mL via ORAL

## 2014-05-28 MED ORDER — FUROSEMIDE 40 MG PO TABS: 40.0000 mg | ORAL_TABLET | Freq: Every day | ORAL | Status: DC

## 2014-05-28 MED ORDER — IPRATROPIUM-ALBUTEROL 0.5-2.5 (3) MG/3ML IN SOLN
3.0000 mL | Freq: Three times a day (TID) | RESPIRATORY_TRACT | Status: DC
  Administered 2014-05-28 – 2014-05-31 (×8): 3 mL via RESPIRATORY_TRACT
  Filled 2014-05-28 (×9): qty 3

## 2014-05-28 MED ORDER — IPRATROPIUM-ALBUTEROL 0.5-2.5 (3) MG/3ML IN SOLN: 3.0000 mL | RESPIRATORY_TRACT | Status: DC | PRN

## 2014-05-28 MED ORDER — FUROSEMIDE 40 MG PO TABS
40.0000 mg | ORAL_TABLET | Freq: Every day | ORAL | Status: DC
  Administered 2014-05-28 – 2014-05-31 (×4): 40 mg via ORAL
  Filled 2014-05-28 (×5): qty 1

## 2014-05-28 MED ORDER — HYDROCODONE-ACETAMINOPHEN 5-325 MG PO TABS: 1.0000 | ORAL_TABLET | Freq: Four times a day (QID) | ORAL | Status: DC | PRN

## 2014-05-28 MED ORDER — LEVOFLOXACIN 500 MG PO TABS: 500.0000 mg | ORAL_TABLET | Freq: Every day | ORAL | Status: DC

## 2014-05-28 MED ORDER — NICOTINE 21 MG/24HR TD PT24
21.0000 mg | MEDICATED_PATCH | Freq: Every day | TRANSDERMAL | Status: DC
  Administered 2014-05-28 – 2014-05-31 (×4): 21 mg via TRANSDERMAL
  Filled 2014-05-28 (×4): qty 1

## 2014-05-28 MED ORDER — LEVOFLOXACIN 500 MG PO TABS
500.0000 mg | ORAL_TABLET | ORAL | Status: DC
  Administered 2014-05-28: 500 mg via ORAL
  Filled 2014-05-28 (×3): qty 1

## 2014-05-28 NOTE — Discharge Instructions (Signed)
Sepsis °Sepsis is a serious infection of your blood or tissues that affects your whole body. The infection that causes sepsis may be bacterial, viral, fungal, or parasitic. Sepsis may be life threatening. Sepsis can cause your blood pressure to drop. This may result in shock. Shock causes your central nervous system and your organs to stop working correctly.  °RISK FACTORS °Sepsis can happen in anyone, but it is more likely to happen in people who have weakened immune systems. °SIGNS AND SYMPTOMS  °Symptoms of sepsis can include: °· Fever or low body temperature (hypothermia). °· Rapid breathing (hyperventilation). °· Chills. °· Rapid heartbeat (tachycardia). °· Confusion or light-headedness. °· Trouble breathing. °· Urinating much less than usual. °· Cool, clammy skin or red, flushed skin. °· Other problems with the heart, kidneys, or brain. °DIAGNOSIS  °Your health care provider will likely do tests to look for an infection, to see if the infection has spread to your blood, and to see how serious your condition is. Tests can include: °· Blood tests, including cultures of your blood. °· Cultures of other fluids from your body, such as: °¨ Urine. °¨ Pus from wounds. °¨ Mucus coughed up from your lungs. °· Urine tests other than cultures. °· X-ray exams or other imaging tests. °TREATMENT  °Treatment will begin with elimination of the source of infection. If your sepsis is likely caused by a bacterial or fungal infection, you will be given antibiotic or antifungal medicines. °You may also receive: °· Oxygen. °· Fluids through an IV tube. °· Medicines to increase your blood pressure. °· A machine to clean your blood (dialysis) if your kidneys fail. °· A machine to help you breathe if your lungs fail. °SEEK IMMEDIATE MEDICAL CARE IF: °You get an infection or develop any of the signs and symptoms of sepsis after surgery or a hospitalization. °Document Released: 03/22/2003 Document Revised: 06/28/2013 Document Reviewed:  02/28/2013 °[employer]® Patient Information ©2015 [employer]. This information is not intended to replace advice given to you by your health care provider. Make sure you discuss any questions you have with your health care provider. ° °

## 2014-05-28 NOTE — Discharge Summary (Signed)
Physician Discharge Summary  Peter [name] SWF:[id number] DOB: 01-07-1951 DOA: 05/25/2014  PCP: Peter Marek, MD  Admit date: 05/25/2014 Discharge date: 05/29/2014  Recommendations for Outpatient Follow-up:  1. Pt will need to follow up with PCP in 2-3 weeks post discharge 2. Please obtain BMP to evaluate electrolytes and kidney function 3. Please also check CBC to evaluate Hg and Hct levels 4. Attempted to call family to discuss d/c plan to SNF as recommended by PT specialist, message left and awaiting call back 5. Please note that pt needs to continue taking Levaquin for 3 more days post discharge to complete 7 days therapy for UTI  6. Stop date for ABX 05/31/2014  7. Please note that pt has known combined CHF with EF 30 - 35% per last 2 D ECHO 10/2013, due to significant diuresis on Lasix 40 mg IV QD, pt was transitioned to oral Lasix 40 mg PO to continue taking upon discharge 8. Pt will need close monitoring of renal function, potassium level, weight as he is on Lasix 9. Weight 11/22 recorded 136 lbs, per records, that appears to be baseline dry weight  10. Will route d/c summary to PCP   Discharge Diagnoses:  Active Problems:   Acute alcohol intoxication   COPD (chronic obstructive pulmonary disease)   Hyponatremia   Cirrhosis   Sepsis   Fever   Alcohol abuse   Malnutrition of moderate degree  Discharge Condition: Stable  Diet recommendation: Heart healthy diet discussed in details   HPI:  Pt is 63 y.o. male with history of alcoholism who lives with his brother was brought to the ER after patient was found to be weak with chills and rigors. Patient mother states that patient was found to have sweating and chills. Patient drinks alcohol everyday. Denies any chest pain or shortness of breath, no abdominal pain or diarrhea. Patient is as per mother noncompliant with his medications. In the ER patient was found to be febrile with labs showing severe hyponatremia and sodium was  around 116. Lactic acid was elevated. ER physician had given patient 2 L of normal saline bolus and blood cultures were sent and patient was started on broad spectrum ABX Vancomycin and Zosyn. Pt has history of VDRF.  Assessment and Plan:    Active Problems:  Severe sepsis - criteria met with T 101.9 F, tachycardia with HR 131, hypotension BP 73/36, RR > 28 bpm, elevated lactic acid  - CXR with no signs of PNA but UA suggestive of UTI, urine cultures positive for lactobacillus sp.  - pt clinically improving and reports feeling better, tolerating current diet well - Tmax 99.7 F over the past 24 hours, BP 117/71 mmHg this AM 11/22 - pt has received Vancomycin and Zosyn, today is day #4/7, will transition to oral Levaquin 11/22, stop day 05/31/2014  - continue to provide supportive care with analgesia, antiemetics as needed - pt transferred to telemetry bed from ICU 11/21 and has been doing well, no events on telemetry, will d/c telemetry 11/22 - possible d/c to SNF vs home in AM  Acute respiratory failure in pt with underlying COPD  - more crackles on exam 11/21 - pt with known combined systolic and diastolic CHF - stop IVF (22/02) and pt placed on lasix 40 mg IV x 1 dose - pt has responded well to Lasix IV and weight trending down 142 lbs --> 136 lbs this AM 11/22 - will transition to oral lasix 40 mg O QD, please readjust the  dose as clinically indicated  - continue to monitor weights even upon discharge  - pt to continue taking Lasix 40 mg PO QD upon discharge   Acute on chronic combined systolic and diastolic CHF - last 2 D ECHO 10/2013 with EF 30 - 35% and grade I diastolic dysfunction - Lasix as noted above upon discharge - monitor daily weights with I's and O's while in the hospital   Acute on chronic pancreatitis - alcohol induced  - pt tolerating soft diet well - lipase is trending down   Transaminitis - from Alcohol use - LFT's now WNL   COPD (chronic  obstructive pulmonary disease) - clinically compensated at this time - pt is maintaining oxygen saturation at target range on RA - will plan on tapering to RA today - continue to take BD scheduled and as needed   Hyponatremia - secondary to dehydration in the setting of alcohol use - Na trending up: 125 --> 128 --> 132 (11/22)  HTN - pt is on Propranolol, Lasix   Cirrhosis - from alcohol abuse   Pancytopenia - secondary to alcohol induced bone marrow damage - blood counts remain stable with no signs of active bleeding  - continue iron supplementation   Severe PCM - secondary to poor oral intake and heavy alcohol abuse - encouraged PO intake - nutritionist consulted    HLD - continue statin    Tobacco abuse - continue Nicotine patch   Hypokalemia - supplemented and WNL this AM   Alcohol abuse - provided consultation on cessation - continue MVI, thiamine, folic acid   DVT prophylaxis  SCD's, stop upon discharge   Code Status: Full Family Communication: Pt at bedside Disposition Plan: plan d/c SNF or home in AM, awaiting response back from family. I left message to call me back to discuss d/c plan for AM as pt is clinically stable for d/c and pt himself wants to be discharged    IV Access:    Peripheral IV Procedures and diagnostic studies:    Dg Chest 2 View 05/25/2014 Shallow inspiration with atelectasis in the lung bases.   Ct Head Wo Contrast 05/25/2014 Findings consistent with right maxillary, left frontal and bilateral ethmoid sinusitis. Mild diffuse cortical atrophy. Minimal chronic ischemic white matter disease. No acute intracranial abnormality seen.  Medical Consultants:    None Other Consultants:    Physical therapy/OT requested 11/20 Anti-Infectives:    Vancomycin 11/19 --> 11/22  Zosyn 11/19 --> 11/22  Levaquin 11/22 --> 11/25  Discharge Exam: Filed Vitals:   05/28/14 1400  BP: 111/71  Pulse: 104  Temp:  99.7 F (37.6 C)  Resp: 18   Filed Vitals:   05/27/14 1507 05/27/14 2353 05/28/14 0523 05/28/14 1400  BP:  157/96 155/111 111/71  Pulse: 100 104 98 104  Temp:  101.3 F (38.5 C) 98.8 F (37.1 C) 99.7 F (37.6 C)  TempSrc:  Oral Oral Oral  Resp:  18 18 18   Height:      Weight:   61.689 kg (136 lb)   SpO2:  98% 99% 97%    General: Pt is alert, follows commands appropriately, not in acute distress Cardiovascular: Regular rate and rhythm, no rubs, no gallops Respiratory: Clear to auscultation bilaterally, no wheezing, minimal crackles at bases  Abdominal: Soft, non tender, non distended, bowel sounds +, no guarding Extremities: +1 bilateral LE pitting edema, no cyanosis, pulses palpable bilaterally DP and PT Neuro: Grossly non focal but with persistent mild resting tremor   Discharge  Instructions     Medication List    TAKE these medications        acamprosate 333 MG tablet  Commonly known as:  CAMPRAL  Take 2 tablets (666 mg total) by mouth 3 (three) times daily with meals.     albuterol 108 (90 BASE) MCG/ACT inhaler  Commonly known as:  PROVENTIL HFA;VENTOLIN HFA  Inhale 2 puffs into the lungs 4 (four) times daily.     atorvastatin 80 MG tablet  Commonly known as:  LIPITOR  Take 1 tablet (80 mg total) by mouth daily.     brimonidine 0.2 % ophthalmic solution  Commonly known as:  ALPHAGAN  Place 1 drop into both eyes 2 (two) times daily.     budesonide-formoterol 80-4.5 MCG/ACT inhaler  Commonly known as:  SYMBICORT  Inhale 2 puffs into the lungs 2 (two) times daily.     clotrimazole 1 % cream  Commonly known as:  LOTRIMIN  Apply topically 2 (two) times daily.     ferrous sulfate 325 (65 FE) MG tablet  Take 1 tablet (325 mg total) by mouth daily with breakfast. For low iron.     folic acid 1 MG tablet  Commonly known as:  FOLVITE  Take 1 tablet (1 mg total) by mouth daily.     furosemide 40 MG tablet  Commonly known as:  LASIX  Take 1 tablet (40 mg  total) by mouth daily.     HYDROcodone-acetaminophen 5-325 MG per tablet  Commonly known as:  NORCO/VICODIN  Take 1 tablet by mouth every 6 (six) hours as needed for moderate pain.     hydrOXYzine 25 MG tablet  Commonly known as:  ATARAX/VISTARIL  Take 1 tablet (25 mg total) by mouth 3 (three) times daily as needed for anxiety.     ipratropium-albuterol 0.5-2.5 (3) MG/3ML Soln  Commonly known as:  DUONEB  Take 3 mLs by nebulization every 4 (four) hours as needed.     levofloxacin 500 MG tablet  Commonly known as:  LEVAQUIN  Take 1 tablet (500 mg total) by mouth daily.     multivitamin tablet  Take 1 tablet by mouth daily.     nicotine 21 mg/24hr patch  Commonly known as:  NICODERM CQ  Place 1 patch (21 mg total) onto the skin daily.     pantoprazole 40 MG tablet  Commonly known as:  PROTONIX  Take 1 tablet (40 mg total) by mouth daily.     propranolol 10 MG tablet  Commonly known as:  INDERAL  Take 1 tablet (10 mg total) by mouth 3 (three) times daily.     thiamine 100 MG tablet  Commonly known as:  VITAMIN B-1  Take 1 tablet (100 mg total) by mouth daily. For vitamin B deficiency.     traZODone 50 MG tablet  Commonly known as:  DESYREL  Take 1 tablet (50 mg total) by mouth at bedtime.     vitamin B-12 100 MCG tablet  Commonly known as:  CYANOCOBALAMIN  Take 1 tablet (100 mcg total) by mouth daily. For low B12.     vitamin E 200 UNIT capsule  Take 1 capsule (200 Units total) by mouth daily.            Follow-up Information    Follow up with Peter Marek, MD.   Specialty:  Internal Medicine   Contact information:   [place] [address] [phone]       Follow up with  Faye Ramsay, MD.   Specialty:  Internal Medicine   Why:  As needed, If symptoms worsen   Contact information:   [address] [place] [address] [phone]        The results of significant diagnostics from this  hospitalization (including imaging, microbiology, ancillary and laboratory) are listed below for reference.     Microbiology: Recent Results (from the past 240 hour(s))  Blood culture (routine x 2)     Status: None (Preliminary result)   Collection Time: 05/25/14  1:45 AM  Result Value Ref Range Status   Specimen Description BLOOD RIGHT ANTECUBITAL  Final   Special Requests BOTTLES DRAWN AEROBIC AND ANAEROBIC 3 ML EACH  Final   Culture  Setup Time   Final    05/25/2014 12:41 Performed at [facility]    Culture   Final           BLOOD CULTURE RECEIVED NO GROWTH TO DATE CULTURE WILL BE HELD FOR 5 DAYS BEFORE ISSUING A FINAL NEGATIVE REPORT Performed at [facility]    Report Status PENDING  Incomplete  Blood culture (routine x 2)     Status: None (Preliminary result)   Collection Time: 05/25/14  1:45 AM  Result Value Ref Range Status   Specimen Description BLOOD  L HAND  Final   Special Requests BOTTLES DRAWN AEROBIC AND ANAEROBIC 5 CC EACH  Final   Culture  Setup Time   Final    05/25/2014 12:41 Performed at [facility]    Culture   Final           BLOOD CULTURE RECEIVED NO GROWTH TO DATE CULTURE WILL BE HELD FOR 5 DAYS BEFORE ISSUING A FINAL NEGATIVE REPORT Performed at [facility]    Report Status PENDING  Incomplete  Urine culture     Status: None   Collection Time: 05/25/14  3:12 AM  Result Value Ref Range Status   Specimen Description URINE, CATHETERIZED  Final   Special Requests NONE  Final   Culture  Setup Time   Final    05/25/2014 12:40 Performed at [place]   Final    >=100,000 COLONIES/ML Performed at [facility]    Culture   Final    LACTOBACILLUS SPECIES Note: Standardized susceptibility testing for this organism is not available. Performed at [facility]    Report Status 05/26/2014 FINAL  Final  MRSA PCR Screening     Status: None   Collection Time: 05/25/14  6:32 AM   Result Value Ref Range Status   MRSA by PCR NEGATIVE NEGATIVE Final    Comment:        The GeneXpert MRSA Assay (FDA approved for NASAL specimens only), is one component of a comprehensive MRSA colonization surveillance program. It is not intended to diagnose MRSA infection nor to guide or monitor treatment for MRSA infections.      Labs: Basic Metabolic Panel:  Recent Labs Lab 05/25/14 1620 05/25/14 1949 05/26/14 0335 05/27/14 0325 05/27/14 0334 05/28/14 0500  NA 125* 125* 128*  --  128* 132*  K 3.6* 3.5* 3.4*  --  3.0* 4.3  CL 89* 89* 92*  --  94* 95*  CO2 20 17* 20  --  21 19  GLUCOSE 88 122* 89  --  110* 103*  BUN 19 19 18   --  10 9  CREATININE 1.14 1.35 1.26  --  1.06  0.93  CALCIUM 8.9 8.9 9.1  --  8.9 9.4  MG  --   --   --  1.4*  --  1.5   Liver Function Tests:  Recent Labs Lab 05/25/14 0145 05/25/14 0810 05/27/14 0334  AST 99* 81* 37  ALT 46 39 23  ALKPHOS 107 99 66  BILITOT 0.4 0.5 0.5  PROT 8.7* 8.0 6.8  ALBUMIN 4.0 3.7 3.1*    Recent Labs Lab 05/25/14 0145 05/27/14 0334  LIPASE 157* 89*    Recent Labs Lab 05/25/14 0810  AMMONIA 42   CBC:  Recent Labs Lab 05/25/14 0145 05/25/14 0223 05/25/14 0810 05/26/14 0335 05/27/14 0334 05/28/14 0500  WBC 4.4  --  2.9* 3.5* 6.1 5.5  NEUTROABS  --   --  1.9  --   --   --   HGB 9.5* 11.6* 9.0* 8.2* 7.9* 8.9*  HCT 27.5* 34.0* 26.1* 23.4* 23.3* 27.1*  MCV 76.4*  --  76.5* 78.3 78.7 79.2  PLT 95*  --  100* 97* 100* 133*   Cardiac Enzymes:  Recent Labs Lab 05/25/14 0810  CKTOTAL 310*  TROPONINI <0.30   BNP: BNP (last 3 results)  Recent Labs  11/02/13 0736  PROBNP 12677.0*   CBG:  Recent Labs Lab 05/25/14 0042  GLUCAP 74     SIGNED: Time coordinating discharge: Over 30 minutes  Faye Ramsay, MD  [facility] 05/28/2014, 2:54 PM Pager [phone]  If 7PM-7AM, please contact night-coverage [email or web address] Password [email or web address]

## 2014-05-28 NOTE — Progress Notes (Signed)
NUTRITION FOLLOW-UP  INTERVENTION: -Discontinue Ensure Pudding po BID per family request -Provide Ensure Complete po BID, each supplement provides 350 kcal and 13 grams of protein -RD to continue to monitor  NUTRITION DIAGNOSIS: Inadequate oral intake related to decreased appetite/ETOH abuse as evidenced by decreased PO intake for past two weeks, ongoing.  Goal: Pt to meet >/= 90% of their estimated nutrition needs, not meeting.   Monitor:  Total protein/energy intake, labs, weights  ASSESSMENT: Peter [name] is a 63 y.o. male with history of alcoholism who lives with his brother was brought to the ER after patient was found to be weak with chills and rigors. Patient mother states that patient was found to have sweating and chills. Patient drinks alcohol everyday  Pt asleep during visit, RD spoke to family member. Family reports pt not liking the Ensure pudding supplement. RD provided Ensure drink supplement for pt to try. RD to order BID.  PO intake: 0%, pt was stated they were not hungry Per family, pt is eating a little better. His appetite has decreased since withdrawal protocol was ordered.  Labs reviewed: Low Na Glucose 103  Height: Ht Readings from Last 1 Encounters:  05/25/14 5\' 7"  (1.702 m)    Weight: Wt Readings from Last 1 Encounters:  05/28/14 136 lb (61.689 kg)    BMI:  Body mass index is 21.3 kg/(m^2).  RE-estimated Nutritional Needs: Kcal: 1700-1900 Protein: 70-85 gram Fluid: 1.7L/day, sodium trending up  Skin: WDL, generalized edema  Diet Order: DIET SOFT  EDUCATION NEEDS: -No education needs identified at this time   Intake/Output Summary (Last 24 hours) at 05/28/14 1148 Last data filed at 05/28/14 0523  Gross per 24 hour  Intake    160 ml  Output    350 ml  Net   -190 ml    Last BM: 11/20  Labs:   Recent Labs Lab 05/26/14 0335 05/27/14 0325 05/27/14 0334 05/28/14 0500  NA 128*  --  128* 132*  K 3.4*  --  3.0* 4.3  CL 92*   --  94* 95*  CO2 20  --  21 19  BUN 18  --  10 9  CREATININE 1.26  --  1.06 0.93  CALCIUM 9.1  --  8.9 9.4  MG  --  1.4*  --  1.5  GLUCOSE 89  --  110* 103*    CBG (last 3)  No results for input(s): GLUCAP in the last 72 hours.  Scheduled Meds: . atorvastatin  80 mg Oral Daily  . brimonidine  1 drop Both Eyes BID  . budesonide-formoterol  2 puff Inhalation BID  . feeding supplement (ENSURE)  1 Container Oral BID BM  . ferrous sulfate  325 mg Oral Q breakfast  . folic acid  1 mg Oral Daily  . ipratropium-albuterol  3 mL Nebulization TID  . multivitamin with minerals  1 tablet Oral Daily  . pantoprazole  40 mg Oral Daily  . piperacillin-tazobactam (ZOSYN)  IV  3.375 g Intravenous Q8H  . potassium chloride  40 mEq Oral Once  . propranolol  10 mg Oral TID  . sodium chloride  3 mL Intravenous Q12H  . thiamine  100 mg Oral Daily   Or  . thiamine  100 mg Intravenous Daily  . traZODone  50 mg Oral QHS  . vancomycin  750 mg Intravenous Q12H  . vitamin B-12  100 mcg Oral Daily    Continuous Infusions:    Clayton Bibles, MS, RD,  LDN Pager: [phone] After Hours Pager: [phone]

## 2014-05-28 NOTE — Evaluation (Signed)
Physical Therapy Evaluation Patient Details Name: Peter [name] MRN: [id number] DOB: 09-15-1950 Today's Date: 05/28/2014   History of Present Illness  63 yo male adm 05/25/14 through [facility] ED with fever, chills, rigors--.sepsis, ARF, hyponatremia; Pt also with ETOH abuse; PMHx:  CVA,  HTN,  cirrhosis, ETOH and tobacco abuse;    Clinical Impression  Pt will benefit from PT to address deficits below; recommend SNF at this time, DME TBA; pt is unable to care for himself at this time, requiring max to total assist for bed mobility, he is more responsive today than yesterday (per OT notes). Will follow, however pt's baseline is drinking and sleeping allday, pt confirms this--pt mother states he used to walk everywhere (to store)  but does not do that anymore,people bring him his beer;    Follow Up Recommendations SNF;Supervision/Assistance - 24 hour    Equipment Recommendations  Other (comment) (TBA)    Recommendations for Other Services       Precautions / Restrictions Precautions Precautions: Fall      Mobility  Bed Mobility Overal bed mobility: Needs Assistance Bed Mobility: Supine to Sit;Sit to Supine     Supine to sit: Max assist Sit to supine: Total assist;Max assist   General bed mobility comments: multi modal cues for sequence, self assist, incr pt participation; pt requires incr time and +2 assist to scoot up in supine although he does intiate this movement   Transfers                 General transfer comment: NT d/t pt lethargic, not keeping eyes open after sitting up, posterior lean, pt pushes himself back down onto bed  Ambulation/Gait                Stairs            Wheelchair Mobility    Modified Rankin (Stroke Patients Only)       Balance Overall balance assessment: Needs assistance Sitting-balance support: Bilateral upper extremity supported;Feet supported Sitting balance-Leahy Scale: Zero   Postural control: Posterior lean      Standing balance comment: NT                             Pertinent Vitals/Pain Pain Assessment: No/denies pain    Home Living Family/patient expects to be discharged to:: Other (Comment) (SNF vs rehab for substane abuse) Living Arrangements: Non-relatives/Friends Available Help at Discharge: Available PRN/intermittently Type of Home: House Home Access: Stairs to enter Entrance Stairs-Rails: None Entrance Stairs-Number of Steps: 4 Home Layout: One level Home Equipment: Walker - 2 wheels;Cane - single point;Tub bench      Prior Function           Comments: pt and his mother state he stays in bed most of time; He was more ambulatory until recently, now sleeps most of day, drinks 40s all day     Hand Dominance        Extremity/Trunk Assessment     RUE Deficits / Details: AAROM WFL bil UEs for PT; strength 2+/5         Lower Extremity Assessment: RLE deficits/detail;LLE deficits/detail RLE Deficits / Details: AROM WFL bil LEs       Communication   Communication: No difficulties  Cognition Arousal/Alertness: Lethargic Behavior During Therapy: Flat affect Overall Cognitive Status: Difficult to assess Area of Impairment: Problem solving   Current Attention Level: Focused   Following Commands: Follows one step commands  inconsistently     Problem Solving: Slow processing;Decreased initiation;Difficulty sequencing;Requires verbal cues;Requires tactile cues General Comments: pt responds appropriately to some questions, closes his eyes frequently    General Comments      Exercises        Assessment/Plan    PT Assessment Patient needs continued PT services  PT Diagnosis Difficulty walking   PT Problem List Decreased strength;Decreased activity tolerance;Decreased balance;Decreased mobility;Decreased safety awareness  PT Treatment Interventions DME instruction;Gait training;Functional mobility training;Therapeutic activities;Therapeutic  exercise;Patient/family education   PT Goals (Current goals can be found in the Care Plan section) Acute Rehab PT Goals Patient Stated Goal: pt unable to state; Pt mother states she  wants him to be able todo things for himself PT Goal Formulation: With patient/family Time For Goal Achievement: 06/04/14 Potential to Achieve Goals: Fair    Frequency Min 3X/week   Barriers to discharge        Co-evaluation               End of Session   Activity Tolerance: Patient limited by lethargy Patient left: in bed;with call bell/phone within reach;with bed alarm set;with family/visitor present           Time: 1230-1251 PT Time Calculation (min) (ACUTE ONLY): 21 min   Charges:   PT Evaluation $Initial PT Evaluation Tier I: 1 Procedure PT Treatments $Therapeutic Activity: 8-22 mins   PT G Codes:          Haruna Rohlfs 05-31-2014, 1:17 PM

## 2014-05-29 ENCOUNTER — Inpatient Hospital Stay (HOSPITAL_COMMUNITY): Payer: PRIVATE HEALTH INSURANCE

## 2014-05-29 DIAGNOSIS — J690 Pneumonitis due to inhalation of food and vomit: Secondary | ICD-10-CM

## 2014-05-29 DIAGNOSIS — E876 Hypokalemia: Secondary | ICD-10-CM

## 2014-05-29 LAB — CBC
HCT: 25.5 % — ABNORMAL LOW (ref 39.0–52.0)
Hemoglobin: 8.4 g/dL — ABNORMAL LOW (ref 13.0–17.0)
MCH: 26.5 pg (ref 26.0–34.0)
MCHC: 32.9 g/dL (ref 30.0–36.0)
MCV: 80.4 fL (ref 78.0–100.0)
PLATELETS: 155 10*3/uL (ref 150–400)
RBC: 3.17 MIL/uL — AB (ref 4.22–5.81)
RDW: 15.1 % (ref 11.5–15.5)
WBC: 8.1 10*3/uL (ref 4.0–10.5)

## 2014-05-29 LAB — COMPREHENSIVE METABOLIC PANEL
ALBUMIN: 2.9 g/dL — AB (ref 3.5–5.2)
ALT: 17 U/L (ref 0–53)
AST: 29 U/L (ref 0–37)
Alkaline Phosphatase: 61 U/L (ref 39–117)
Anion gap: 16 — ABNORMAL HIGH (ref 5–15)
BILIRUBIN TOTAL: 0.5 mg/dL (ref 0.3–1.2)
BUN: 10 mg/dL (ref 6–23)
CHLORIDE: 97 meq/L (ref 96–112)
CO2: 23 meq/L (ref 19–32)
Calcium: 9.5 mg/dL (ref 8.4–10.5)
Creatinine, Ser: 1.07 mg/dL (ref 0.50–1.35)
GFR calc Af Amer: 85 mL/min — ABNORMAL LOW (ref >90)
GFR calc non Af Amer: 73 mL/min — ABNORMAL LOW (ref >90)
Glucose, Bld: 113 mg/dL — ABNORMAL HIGH (ref 70–99)
Potassium: 3 mEq/L — ABNORMAL LOW (ref 3.7–5.3)
Sodium: 136 mEq/L — ABNORMAL LOW (ref 137–147)
Total Protein: 7.2 g/dL (ref 6.0–8.3)

## 2014-05-29 LAB — LIPASE, BLOOD: Lipase: 44 U/L (ref 11–59)

## 2014-05-29 LAB — MAGNESIUM: Magnesium: 1.4 mg/dL — ABNORMAL LOW (ref 1.5–2.5)

## 2014-05-29 MED ORDER — POTASSIUM CHLORIDE CRYS ER 20 MEQ PO TBCR
40.0000 meq | EXTENDED_RELEASE_TABLET | Freq: Once | ORAL | Status: AC
  Administered 2014-05-29: 40 meq via ORAL
  Filled 2014-05-29: qty 2

## 2014-05-29 MED ORDER — CLINDAMYCIN PHOSPHATE 600 MG/50ML IV SOLN
600.0000 mg | Freq: Three times a day (TID) | INTRAVENOUS | Status: DC
  Administered 2014-05-29 – 2014-05-31 (×6): 600 mg via INTRAVENOUS
  Filled 2014-05-29 (×7): qty 50

## 2014-05-29 NOTE — Progress Notes (Addendum)
Clinical Social Work Department CLINICAL SOCIAL WORK PLACEMENT NOTE 05/29/2014  Patient:  Peter [name], Peter [name]  Account Number:  [id number] Admit date:  05/25/2014  Clinical Social Worker:  Sindy Messing, LCSW  Date/time:  05/29/2014 10:45 AM  Clinical Social Work is seeking post-discharge placement for this patient at the following level of care:   SKILLED NURSING   (*CSW will update this form in Epic as items are completed)   05/29/2014  Patient/family provided with [name] Department of Clinical Social Work's list of facilities offering this level of care within the geographic area requested by the patient (or if unable, by the patient's family).  05/29/2014  Patient/family informed of their freedom to choose among providers that offer the needed level of care, that participate in Medicare, Medicaid or managed care program needed by the patient, have an available bed and are willing to accept the patient.  05/29/2014  Patient/family informed of [facility]' ownership interest in [facility], as well as of the fact that they are under no obligation to receive care at this facility.  PASARR submitted to EDS on existing # PASARR number received on   FL2 transmitted to all facilities in geographic area requested by pt/family on  05/29/2014 FL2 transmitted to all facilities within larger geographic area on   Patient informed that his/her managed care company has contracts with or will negotiate with  certain facilities, including the following:     Patient/family informed of bed offers received:  05/30/14 Patient chooses bed at [facility] Physician recommends and patient chooses bed at    Patient to be transferred to [facility] on 05/31/14 Patient to be transferred to facility by [facility] Patient and family notified of transfer on 05/31/14 Name of family member notified:  Mom at bedside  The following physician request were entered in Epic:   Additional  Comments:

## 2014-05-29 NOTE — Progress Notes (Signed)
Clinical Social Work Department BRIEF PSYCHOSOCIAL ASSESSMENT 05/29/2014  Patient:  Peter [name], Peter [name]     Account Number:  [id number]     Admit date:  05/25/2014  Clinical Social Worker:  Earlie Server  Date/Time:  05/29/2014 10:45 AM  Referred by:  Physician  Date Referred:  05/29/2014 Referred for  SNF Placement  Substance Abuse   Other Referral:   Interview type:  Patient Other interview type:    PSYCHOSOCIAL DATA Living Status:  FAMILY Admitted from facility:   Level of care:   Primary support name:  Dollie Primary support relationship to patient:  PARENT Degree of support available:   Strong    CURRENT CONCERNS Current Concerns  Post-Acute Placement  Substance Abuse   Other Concerns:    SOCIAL WORK ASSESSMENT / PLAN CSW received referral in order to assist with DC planning and assess substance use. CSW reviewed chart and met with patient and sister at bedside. Patient agreeable to sister being involved during assessment.    Patient reports he lives at home with his brother and another roommate. Patient reports that he was doing well at home and is agreeable to discuss substance use. Patient first drank alcohol when he was 63 years old and started drinking on a regular basis around 62. Patient reports he has reduced his consumption but continues to drink 2 40 oz beers a day. Patient reports that he went to treatment at [facility] about 2-3 years ago but was only able to stay sober about 2 days after releasing from facility. Patient reports that he has been to [place] meetings in the past but has never been consistent with follow up. Patient reports that he will consider going back to treatment but is proud that he has reduced his consumption and does not believe that alcohol could be affect chronic medical problems.    CSW spoke with patient re: PT/OT recommendations for SNF placement. Patient has been to [facility] in the past and is agreeable to return. Patient agreeable to  [facility] search and agreeable to SNF as long as it is local placement.    CSW completed FL2 and faxed out. CSW will follow up with bed offers. Patient reports that mother is involved with care as well.   Assessment/plan status:  Psychosocial Support/Ongoing Assessment of Needs Other assessment/ plan:   SBIRT   Information/referral to community resources:   SNF list  SA resources    PATIENT'S/FAMILY'S RESPONSE TO PLAN OF CARE: Patient alert and oriented. Patient open to discussing substance use but reports that he does not feel that treatment is needed. Patient does feel that medical problems are related to alcohol use but reports he would consider returning to [employer]. Patient agreeable to SNF search but reports he will only stay ST at SNF.       [place], [place] [phone]

## 2014-05-29 NOTE — Progress Notes (Signed)
[facility] PROGRESS NOTE  Peter [name] RFF:[id number] DOB: Apr 24, 1951 DOA: 05/25/2014 PCP: Lorayne Marek, MD   brief narrative 63 year old male with history of alcoholism, COPD, chronic systolic and diastolic CHF cirrhosis of liver admitted with severe sepsis possibly in the setting of UTI. Patient on admission found to have severe hyponatremia, was in alcohol intoxication and acute renal failure. Patient on empiric antibiotics matter down to oral Levaquin since 11/22 still having temp spike now concerning for aspiration PNA.   Assessment/Plan: Severe sepsis Present on admission and now resolved. Required monitoring in ICU on admission. Placed on empiric vancomycin and Zosyn. Urine culture growing lactobacillus species. Transitioned to Levaquin on 11/22. still having fever spikes with MAXIMUM TEMPERATURE of 102.68F. Symptoms concerning for aspiration.Marland Kitchen Antibiotic switched to IV clindamycin 600 mg every 8 hours. Chest x-ray done today showing left lung base infiltrate. -Seen by swallow eval and does not show any signs of aspiration.  Acute hypoxic respiratory failure Possibly due to sepsis with mild exacerbation of CHF. Currently improved with dose of IV Lasix. Continue oral Lasix. Continue home inhalers.  Acute on chronic combined systolic and diastolic CHF Symptoms improved with IV Lasix. Continue home oral Lasix. Last 2-D echo from April 2015 with EF of 30-35% with grade 1 diastolic dysfunction. Monitor daily weights and I/O  Acute on chronic pancreatitis Elevated lipase on admission. Trending down now Counseled on EtOH cessation  Alcohol abuse with cirrhosis of liver Patient activity drinks heavily. Reports drinking 2 Forty oz liquor daily. Monitor on CIWA. Continue thiamine, folate and multivitamin. No signs of withdrawal  Hyponatremia Possibly due to dehydration and sepsis . Resolved with IV fluids  Protein calorie malnutrition and failure to thrive Seen by physical  therapy and recommended skilled nursing facility. Added ensure supplement  Tobacco abuse Counseled on suggestion. Continue nicotine patch  GERD Continue PPI  Hypokalemia Replenish KCl. Check magnesium level  Diet: Heart healthy  Code Status: Full code Family Communication: Sister at bedside Disposition Plan: Skilled nursing facility possibly in a.m. if remains afebrile   Consultants:  None  Procedures:  None  Antibiotics:  IV Vanco and Zosyn 11/19-11/22  Levaquin: 11/22-11/23  Clindamycin 11/23  HPI/Subjective: Pt seen and examined. Had fever spike of 102.68F last evening. Denies SOB or cough.   Objective: Filed Vitals:   05/29/14 1312  BP: 112/65  Pulse: 104  Temp: 98.9 F (37.2 C)  Resp: 21    Intake/Output Summary (Last 24 hours) at 05/29/14 1403 Last data filed at 05/29/14 0438  Gross per 24 hour  Intake      0 ml  Output    600 ml  Net   -600 ml   Filed Weights   05/27/14 1024 05/28/14 0523 05/29/14 0438  Weight: 63.413 kg (139 lb 12.8 oz) 61.689 kg (136 lb) 62.234 kg (137 lb 3.2 oz)    Exam:   General:  Middle aged male in no acute distress  HEENT: No pallor, moist mucosa, poor dentition  Cardiovascular: Normal S1 and S2, no murmurs  Respiratory: Diminished breath sounds over left lung base, no crackles  Abdomen: soft,  Nondistended, nontender, bowel sounds present  Musculoskeletal: Warm, no edema  CNS: Alert and oriented, flat affect, mild tremors  Data Reviewed: Basic Metabolic Panel:  Recent Labs Lab 05/25/14 1949 05/26/14 0335 05/27/14 0325 05/27/14 0334 05/28/14 0500 05/29/14 0525  NA 125* 128*  --  128* 132* 136*  K 3.5* 3.4*  --  3.0* 4.3 3.0*  CL 89* 92*  --  94* 95* 97  CO2 17* 20  --  21 19 23   GLUCOSE 122* 89  --  110* 103* 113*  BUN 19 18  --  10 9 10   CREATININE 1.35 1.26  --  1.06 0.93 1.07  CALCIUM 8.9 9.1  --  8.9 9.4 9.5  MG  --   --  1.4*  --  1.5  --    Liver Function Tests:  Recent Labs Lab  05/25/14 0145 05/25/14 0810 05/27/14 0334 05/29/14 0525  AST 99* 81* 37 29  ALT 46 39 23 17  ALKPHOS 107 99 66 61  BILITOT 0.4 0.5 0.5 0.5  PROT 8.7* 8.0 6.8 7.2  ALBUMIN 4.0 3.7 3.1* 2.9*    Recent Labs Lab 05/25/14 0145 05/27/14 0334 05/29/14 0525  LIPASE 157* 89* 44    Recent Labs Lab 05/25/14 0810  AMMONIA 42   CBC:  Recent Labs Lab 05/25/14 0810 05/26/14 0335 05/27/14 0334 05/28/14 0500 05/29/14 0525  WBC 2.9* 3.5* 6.1 5.5 8.1  NEUTROABS 1.9  --   --   --   --   HGB 9.0* 8.2* 7.9* 8.9* 8.4*  HCT 26.1* 23.4* 23.3* 27.1* 25.5*  MCV 76.5* 78.3 78.7 79.2 80.4  PLT 100* 97* 100* 133* 155   Cardiac Enzymes:  Recent Labs Lab 05/25/14 0810  CKTOTAL 310*  TROPONINI <0.30   BNP (last 3 results)  Recent Labs  11/02/13 0736 05/28/14 0500  PROBNP 12677.0* 6527.0*   CBG:  Recent Labs Lab 05/25/14 0042  GLUCAP 74    Recent Results (from the past 240 hour(s))  Blood culture (routine x 2)     Status: None (Preliminary result)   Collection Time: 05/25/14  1:45 AM  Result Value Ref Range Status   Specimen Description BLOOD RIGHT ANTECUBITAL  Final   Special Requests BOTTLES DRAWN AEROBIC AND ANAEROBIC 3 ML EACH  Final   Culture  Setup Time   Final    05/25/2014 12:41 Performed at [facility]    Culture   Final           BLOOD CULTURE RECEIVED NO GROWTH TO DATE CULTURE WILL BE HELD FOR 5 DAYS BEFORE ISSUING A FINAL NEGATIVE REPORT Performed at [facility]    Report Status PENDING  Incomplete  Blood culture (routine x 2)     Status: None (Preliminary result)   Collection Time: 05/25/14  1:45 AM  Result Value Ref Range Status   Specimen Description BLOOD  L HAND  Final   Special Requests BOTTLES DRAWN AEROBIC AND ANAEROBIC 5 CC EACH  Final   Culture  Setup Time   Final    05/25/2014 12:41 Performed at [facility]    Culture   Final           BLOOD CULTURE RECEIVED NO GROWTH TO DATE CULTURE WILL BE HELD FOR 5 DAYS  BEFORE ISSUING A FINAL NEGATIVE REPORT Performed at [facility]    Report Status PENDING  Incomplete  Urine culture     Status: None   Collection Time: 05/25/14  3:12 AM  Result Value Ref Range Status   Specimen Description URINE, CATHETERIZED  Final   Special Requests NONE  Final   Culture  Setup Time   Final    05/25/2014 12:40 Performed at [place]   Final    >=100,000 COLONIES/ML Performed at [facility]    Culture   Final  LACTOBACILLUS SPECIES Note: Standardized susceptibility testing for this organism is not available. Performed at [facility]    Report Status 05/26/2014 FINAL  Final  MRSA PCR Screening     Status: None   Collection Time: 05/25/14  6:32 AM  Result Value Ref Range Status   MRSA by PCR NEGATIVE NEGATIVE Final    Comment:        The GeneXpert MRSA Assay (FDA approved for NASAL specimens only), is one component of a comprehensive MRSA colonization surveillance program. It is not intended to diagnose MRSA infection nor to guide or monitor treatment for MRSA infections.      Studies: Dg Chest Port 1 View  05/29/2014   CLINICAL DATA:  History of COPD and previous CVA now with cough and fever  EXAM: PORTABLE CHEST - 1 VIEW  COMPARISON:  .  PA and lateral chest x-ray of May 25, 2014  FINDINGS: There are coarse increased lung markings at the left lung base. These are more conspicuous than on the previous study. On the right there is no infiltrate. The heart and pulmonary vascularity are normal. The trachea is midline. There is no pleural effusion. There is chronic deformity of the distal aspect of the left clavicle.  IMPRESSION: Increasing density at the left lung base is consistent with atelectasis or developing pneumonia   Electronically Signed   By: David  Martinique   On: 05/29/2014 08:32    Scheduled Meds: . atorvastatin  80 mg Oral Daily  . brimonidine  1 drop Both Eyes BID  .  budesonide-formoterol  2 puff Inhalation BID  . clindamycin (CLEOCIN) IV  600 mg Intravenous 3 times per day  . feeding supplement (ENSURE COMPLETE)  237 mL Oral BID BM  . ferrous sulfate  325 mg Oral Q breakfast  . folic acid  1 mg Oral Daily  . furosemide  40 mg Oral Daily  . ipratropium-albuterol  3 mL Nebulization TID  . multivitamin with minerals  1 tablet Oral Daily  . nicotine  21 mg Transdermal Daily  . pantoprazole  40 mg Oral Daily  . propranolol  10 mg Oral TID  . sodium chloride  3 mL Intravenous Q12H  . thiamine  100 mg Oral Daily   Or  . thiamine  100 mg Intravenous Daily  . traZODone  50 mg Oral QHS  . vitamin B-12  100 mcg Oral Daily   Continuous Infusions:     Time spent: 25 minutes    Louellen Molder  [facility] Pager [phone] If 7PM-7AM, please contact night-coverage at [email or web address], password [facility] 05/29/2014, 2:03 PM  LOS: 4 days

## 2014-05-29 NOTE — Evaluation (Signed)
Clinical/Bedside Swallow Evaluation Patient Details  Name: Peter [name] MRN: [id number] Date of Birth: 09/23/1950  Today's Date: 05/29/2014 Time: 7614-7092 SLP Time Calculation (min) (ACUTE ONLY): 10 min  Past Medical History:  Past Medical History  Diagnosis Date  . Hypertension   . Hyperlipemia   . Emphysema   . Stroke   . Chronic pain   . Cirrhosis of liver   . Fall   . Dental injury   . Bone spur of other site     Left Foot  . Impaired mobility   . Total self care deficit   . Pneumonia   . Emphysema   . ETOH abuse   . Elevated LFTs   . Arthritis   . Anxiety   . Sinusitis   . Anemia     iron def.  . Hard of hearing   . Diabetes mellitus without complication   . COPD (chronic obstructive pulmonary disease)   . Depression   . Glaucoma   . Emphysema of lung   . Cataract    Past Surgical History:  Past Surgical History  Procedure Laterality Date  . Colonoscopy  01/06/2012    Procedure: COLONOSCOPY;  Surgeon: Beryle Beams, MD;  Location: [facility] ENDOSCOPY;  Service: Endoscopy;  Laterality: N/A;  . Esophagogastroduodenoscopy  01/06/2012    Procedure: ESOPHAGOGASTRODUODENOSCOPY (EGD);  Surgeon: Beryle Beams, MD;  Location: [facility] ENDOSCOPY;  Service: Endoscopy;  Laterality: N/A;  . Esophagogastroduodenoscopy  01/09/2012    Procedure: ESOPHAGOGASTRODUODENOSCOPY (EGD);  Surgeon: Beryle Beams, MD;  Location: [facility] ENDOSCOPY;  Service: Endoscopy;  Laterality: N/A;  . Multiple extractions with alveoloplasty  03/29/2012    Procedure: MULTIPLE EXTRACION WITH ALVEOLOPLASTY;  Surgeon: Gae Bon, DDS;  Location: [place];  Service: Oral Surgery;  Laterality: Bilateral;  . Bone spur  2013    left spur  . Cataract extraction w/ intraocular lens implant    . Mouth surgery    . Colonoscopy     HPI:  63 y.o. male with PMH:  alcoholism, CVA, COPD, pna, cirrhosis, hard of hearing.  Found to have sepis.  CXR Increasing density at the left lung base is consistent with atelectasis or  developing pneumonia.  MBS 01/05/12 recommended Dys 2, thin, mild oral dysphagia and no penetration or aspiration. BSE 10/2013 recommended Dys 3, thin.   Assessment / Plan / Recommendation Clinical Impression  No indications of pharyngeal dysphagia exhibited.  Manipulation of solid texture functional, however recommend pt. continue soft diet texture due to decreased endurance with pna. Suspects pt. may have an esophageal component given chronic ETOH abuse with intermittent reflux. (initially pt. denied but admitted to occasional "indigestion".  SLP educated pt. re: esophageal precautions to decrease aspiration risk from esophageal source.  Pt. verbalized understanding.  No further ST needed at this time.      Aspiration Risk   (mild-mod)    Diet Recommendation Thin liquid (soft)   Liquid Administration via: Cup;Straw Medication Administration: Whole meds with liquid Supervision: Patient able to self feed Compensations: Slow rate;Small sips/bites;Follow solids with liquid Postural Changes and/or Swallow Maneuvers: Seated upright 90 degrees;Upright 30-60 min after meal    Other  Recommendations Oral Care Recommendations: Oral care BID   Follow Up Recommendations  None    Frequency and Duration        Pertinent Vitals/Pain No pain         Swallow Study         Oral/Motor/Sensory Function Overall Oral Motor/Sensory Function: Appears within  functional limits for tasks assessed   Ice Chips Ice chips: Not tested   Thin Liquid Thin Liquid: Within functional limits Presentation: Cup    Nectar Thick Nectar Thick Liquid: Not tested   Honey Thick Honey Thick Liquid: Not tested   Puree Puree: Not tested   Solid   GO    Solid: Within functional limits       [name] 05/29/2014,1:36 PM  [name] [place].[employer] [phone]

## 2014-05-30 LAB — BASIC METABOLIC PANEL
ANION GAP: 15 (ref 5–15)
BUN: 14 mg/dL (ref 6–23)
CO2: 23 meq/L (ref 19–32)
CREATININE: 1.02 mg/dL (ref 0.50–1.35)
Calcium: 9.4 mg/dL (ref 8.4–10.5)
Chloride: 99 mEq/L (ref 96–112)
GFR calc Af Amer: 90 mL/min — ABNORMAL LOW (ref >90)
GFR calc non Af Amer: 77 mL/min — ABNORMAL LOW (ref >90)
Glucose, Bld: 98 mg/dL (ref 70–99)
POTASSIUM: 3.5 meq/L — AB (ref 3.7–5.3)
Sodium: 137 mEq/L (ref 137–147)

## 2014-05-30 MED ORDER — MAGNESIUM SULFATE 2 GM/50ML IV SOLN
2.0000 g | Freq: Once | INTRAVENOUS | Status: AC
  Administered 2014-05-30: 2 g via INTRAVENOUS
  Filled 2014-05-30: qty 50

## 2014-05-30 MED ORDER — POTASSIUM CHLORIDE CRYS ER 20 MEQ PO TBCR
40.0000 meq | EXTENDED_RELEASE_TABLET | Freq: Once | ORAL | Status: AC
  Administered 2014-05-30: 40 meq via ORAL
  Filled 2014-05-30: qty 2

## 2014-05-30 NOTE — Progress Notes (Signed)
Clinical Social Work  CSW met with patient and provided bed offers. Patient chose [facility] who is agreeable to accept when medica lyl stable.  CSW will continue to follow.  [place], [place] [phone]

## 2014-05-30 NOTE — Progress Notes (Deleted)
Discharge instructions and medications reviewed with patient. Patient verbalizes understanding and has no questions at this time. Patient confirms he has all personal belongings in his possession. Patient d/c home.

## 2014-05-30 NOTE — Plan of Care (Signed)
Problem: Phase I Progression Outcomes Goal: Voiding-avoid urinary catheter unless indicated Outcome: Completed/Met Date Met:  05/30/14  Problem: Problem: Cardiovascular Progression Goal: NO ARRHYTHMIAS Outcome: Completed/Met Date Met:  05/30/14 Goal: NO BP ELEVATION Outcome: Completed/Met Date Met:  05/30/14 Goal: PULSES PRESENT/NOT DIMINISHED Outcome: Completed/Met Date Met:  05/30/14

## 2014-05-30 NOTE — Plan of Care (Signed)
Problem: Phase I Progression Outcomes Goal: Pain controlled with appropriate interventions Outcome: Completed/Met Date Met:  05/30/14 Goal: OOB as tolerated unless otherwise ordered Outcome: Completed/Met Date Met:  05/30/14 Goal: Initial discharge plan identified Outcome: Completed/Met Date Met:  05/30/14

## 2014-05-30 NOTE — Progress Notes (Signed)
Physical Therapy Treatment Patient Details Name: Peter [name] MRN: [id number] DOB: 1950/12/22 Today's Date: 05/30/2014    History of Present Illness 63 yo male adm 05/25/14 through [facility] ED with fever, chills, rigors--.sepsis, ARF, hyponatremia; Pt also with ETOH abuse; PMHx:  CVA,  HTN,  cirrhosis, ETOH and tobacco abuse;      PT Comments    Pt AxOx3 but difficult to understand mumbled speech.  Assisted pt to EOB required MAX assist.  Shaky.  Assisted to standing required Max assist + 2 amb limited distance.    Follow Up Recommendations  SNF     Equipment Recommendations       Recommendations for Other Services       Precautions / Restrictions Precautions Precautions: Fall Restrictions Weight Bearing Restrictions: No    Mobility  Bed Mobility Overal bed mobility: Needs Assistance Bed Mobility: Supine to Sit Rolling: Max assist         General bed mobility comments: multiple VC's and increased time with great difficulty rising upper body.  once upright, pt was able to sit EOB at Supervision level.  Transfers                    Ambulation/Gait Ambulation/Gait assistance: +2 physical assistance;+2 safety/equipment;Max assist Ambulation Distance (Feet): 16 Feet Assistive device: Rolling walker (2 wheeled) Gait Pattern/deviations: Step-to pattern;Decreased step length - right;Decreased step length - left;Ataxic;Narrow base of support Gait velocity: decreased   General Gait Details: very unsteady shaky gait with severe posterior lean and ataxic gait.  Required MAX VC's to increase step length and correct proper walker to self distance.     Stairs            Wheelchair Mobility    Modified Rankin (Stroke Patients Only)       Balance                                    Cognition Arousal/Alertness: Awake/alert (difficult to understand mumbled speech) Behavior During Therapy: WFL for tasks assessed/performed                        Exercises      General Comments        Pertinent Vitals/Pain Pain Assessment: No/denies pain    Home Living                      Prior Function            PT Goals (current goals can now be found in the care plan section) Progress towards PT goals: Progressing toward goals    Frequency  Min 3X/week    PT Plan      Co-evaluation             End of Session Equipment Utilized During Treatment: Gait belt Activity Tolerance: Treatment limited secondary to medical complications (Comment) Patient left: in chair;with bed alarm set;with family/visitor present     Time: 5170-0174 PT Time Calculation (min) (ACUTE ONLY): 28 min  Charges:  $Gait Training: 8-22 mins $Therapeutic Activity: 8-22 mins                    G Codes:      Rica Koyanagi  PTA [facility]  Acute  Rehab Pager      [phone]

## 2014-05-30 NOTE — Progress Notes (Signed)
[facility] PROGRESS NOTE  Peter [name] ASN:[id number] DOB: December 10, 1950 DOA: 05/25/2014 PCP: Lorayne Marek, MD   brief narrative 63 year old male with history of alcoholism, COPD, chronic systolic and diastolic CHF cirrhosis of liver admitted with severe sepsis possibly in the setting of UTI. Patient on admission found to have severe hyponatremia, was in alcohol intoxication and acute renal failure. Patient on empiric antibiotics matter down to oral Levaquin since 11/22 still having temp spike now concerning for aspiration PNA.   Assessment/Plan: Severe sepsis Present on admission and now resolved. Required monitoring in ICU on admission. Placed on empiric vancomycin and Zosyn. Urine culture growing lactobacillus species. Transitioned to Levaquin on 11/22. patient however having fever spikes past 48 hrs,  MAXIMUM TEMPERATURE of 102.76F, overnight . Symptoms concerning for aspiration.Marland Kitchen Antibiotic switched to IV clindamycin 600 mg every 8 hours on 11/23. Chest x-ray done on 11/23 showing left lung base infiltrate. -Seen by swallow eval and does not show any signs of aspiration. -repeat blood cx sent on 11/24 Patient clinically appears much improved. Monitor for now. If afebrile next 24 hrs can be discharged to SNF.  Acute hypoxic respiratory failure Possibly due to sepsis with mild exacerbation of CHF. Resolved with dose of IV Lasix. Continue oral Lasix. Continue home inhalers.  Acute on chronic combined systolic and diastolic CHF Symptoms improved with IV Lasix. Continue home oral Lasix. Last 2-D echo from April 2015 with EF of 30-35% with grade 1 diastolic dysfunction. Monitor daily weights and I/O  Acute on chronic pancreatitis Elevated lipase on admission. Trending down now. asymptomatic Counseled on EtOH cessation  Alcohol abuse with cirrhosis of liver Patient activity drinks heavily. Reports drinking 2 Forty oz liquor daily. Monitor on CIWA. Continue thiamine, folate and  multivitamin. No signs of withdrawal  Hyponatremia Possibly due to dehydration and sepsis . Resolved with IV fluids  Protein calorie malnutrition and failure to thrive Seen by physical therapy and recommended skilled nursing facility. Added ensure supplement  Tobacco abuse Counseled on cessation.  Continue nicotine patch  GERD Continue PPI  Hypokalemia Replenish KCl. Replenish low mg  Diet: Heart healthy  Code Status: Full code Family Communication: Sister at bedside Disposition Plan: Skilled nursing facility possibly in a.m. if remains afebrile   Consultants:  None  Procedures:  None  Antibiotics:  IV Vanco and Zosyn 11/19-11/22  Levaquin: 11/22-11/23  Clindamycin 11/23--( stop date 11/30 to complete 14 days abx)  HPI/Subjective: Pt seen and examined. Still spiking temp ( 102.76F)  Still has cough but no SOB. Appears better clinically today.   Objective: Filed Vitals:   05/30/14 0529  BP: 118/76  Pulse: 95  Temp: 101.5 F (38.6 C)  Resp: 18    Intake/Output Summary (Last 24 hours) at 05/30/14 1347 Last data filed at 05/30/14 0818  Gross per 24 hour  Intake    390 ml  Output    650 ml  Net   -260 ml   Filed Weights   05/28/14 0523 05/29/14 0438 05/30/14 0529  Weight: 61.689 kg (136 lb) 62.234 kg (137 lb 3.2 oz) 61.78 kg (136 lb 3.2 oz)    Exam:   General:  Middle aged male in no acute distress  HEENT: No pallor, moist mucosa, poor dentition  Cardiovascular: Normal S1 and S2, no murmurs  Respiratory: Diminished breath sounds over left lung base,fine rt lung base crackles  Abdomen: soft,  Nondistended, nontender, bowel sounds present  Musculoskeletal: Warm, no edema  CNS: Alert and oriented, flat affect, minimal  tremors  Data Reviewed: Basic Metabolic Panel:  Recent Labs Lab 05/26/14 0335 05/27/14 0325 05/27/14 0334 05/28/14 0500 05/29/14 0525 05/30/14 0500  NA 128*  --  128* 132* 136* 137  K 3.4*  --  3.0* 4.3 3.0* 3.5*   CL 92*  --  94* 95* 97 99  CO2 20  --  21 19 23 23   GLUCOSE 89  --  110* 103* 113* 98  BUN 18  --  10 9 10 14   CREATININE 1.26  --  1.06 0.93 1.07 1.02  CALCIUM 9.1  --  8.9 9.4 9.5 9.4  MG  --  1.4*  --  1.5 1.4*  --    Liver Function Tests:  Recent Labs Lab 05/25/14 0145 05/25/14 0810 05/27/14 0334 05/29/14 0525  AST 99* 81* 37 29  ALT 46 39 23 17  ALKPHOS 107 99 66 61  BILITOT 0.4 0.5 0.5 0.5  PROT 8.7* 8.0 6.8 7.2  ALBUMIN 4.0 3.7 3.1* 2.9*    Recent Labs Lab 05/25/14 0145 05/27/14 0334 05/29/14 0525  LIPASE 157* 89* 44    Recent Labs Lab 05/25/14 0810  AMMONIA 42   CBC:  Recent Labs Lab 05/25/14 0810 05/26/14 0335 05/27/14 0334 05/28/14 0500 05/29/14 0525  WBC 2.9* 3.5* 6.1 5.5 8.1  NEUTROABS 1.9  --   --   --   --   HGB 9.0* 8.2* 7.9* 8.9* 8.4*  HCT 26.1* 23.4* 23.3* 27.1* 25.5*  MCV 76.5* 78.3 78.7 79.2 80.4  PLT 100* 97* 100* 133* 155   Cardiac Enzymes:  Recent Labs Lab 05/25/14 0810  CKTOTAL 310*  TROPONINI <0.30   BNP (last 3 results)  Recent Labs  11/02/13 0736 05/28/14 0500  PROBNP 12677.0* 6527.0*   CBG:  Recent Labs Lab 05/25/14 0042  GLUCAP 74    Recent Results (from the past 240 hour(s))  Blood culture (routine x 2)     Status: None (Preliminary result)   Collection Time: 05/25/14  1:45 AM  Result Value Ref Range Status   Specimen Description BLOOD RIGHT ANTECUBITAL  Final   Special Requests BOTTLES DRAWN AEROBIC AND ANAEROBIC 3 ML EACH  Final   Culture  Setup Time   Final    05/25/2014 12:41 Performed at [facility]    Culture   Final           BLOOD CULTURE RECEIVED NO GROWTH TO DATE CULTURE WILL BE HELD FOR 5 DAYS BEFORE ISSUING A FINAL NEGATIVE REPORT Performed at [facility]    Report Status PENDING  Incomplete  Blood culture (routine x 2)     Status: None (Preliminary result)   Collection Time: 05/25/14  1:45 AM  Result Value Ref Range Status   Specimen Description BLOOD  L HAND   Final   Special Requests BOTTLES DRAWN AEROBIC AND ANAEROBIC 5 CC EACH  Final   Culture  Setup Time   Final    05/25/2014 12:41 Performed at [facility]    Culture   Final           BLOOD CULTURE RECEIVED NO GROWTH TO DATE CULTURE WILL BE HELD FOR 5 DAYS BEFORE ISSUING A FINAL NEGATIVE REPORT Performed at [facility]    Report Status PENDING  Incomplete  Urine culture     Status: None   Collection Time: 05/25/14  3:12 AM  Result Value Ref Range Status   Specimen Description URINE, CATHETERIZED  Final   Special Requests NONE  Final  Culture  Setup Time   Final    05/25/2014 12:40 Performed at [facility]   Final    >=100,000 COLONIES/ML Performed at [facility]    Culture   Final    LACTOBACILLUS SPECIES Note: Standardized susceptibility testing for this organism is not available. Performed at [facility]    Report Status 05/26/2014 FINAL  Final  MRSA PCR Screening     Status: None   Collection Time: 05/25/14  6:32 AM  Result Value Ref Range Status   MRSA by PCR NEGATIVE NEGATIVE Final    Comment:        The GeneXpert MRSA Assay (FDA approved for NASAL specimens only), is one component of a comprehensive MRSA colonization surveillance program. It is not intended to diagnose MRSA infection nor to guide or monitor treatment for MRSA infections.      Studies: Dg Chest Port 1 View  05/29/2014   CLINICAL DATA:  History of COPD and previous CVA now with cough and fever  EXAM: PORTABLE CHEST - 1 VIEW  COMPARISON:  .  PA and lateral chest x-ray of May 25, 2014  FINDINGS: There are coarse increased lung markings at the left lung base. These are more conspicuous than on the previous study. On the right there is no infiltrate. The heart and pulmonary vascularity are normal. The trachea is midline. There is no pleural effusion. There is chronic deformity of the distal aspect of the left clavicle.  IMPRESSION:  Increasing density at the left lung base is consistent with atelectasis or developing pneumonia   Electronically Signed   By: David  Martinique   On: 05/29/2014 08:32    Scheduled Meds: . atorvastatin  80 mg Oral Daily  . brimonidine  1 drop Both Eyes BID  . budesonide-formoterol  2 puff Inhalation BID  . clindamycin (CLEOCIN) IV  600 mg Intravenous 3 times per day  . feeding supplement (ENSURE COMPLETE)  237 mL Oral BID BM  . ferrous sulfate  325 mg Oral Q breakfast  . folic acid  1 mg Oral Daily  . furosemide  40 mg Oral Daily  . ipratropium-albuterol  3 mL Nebulization TID  . multivitamin with minerals  1 tablet Oral Daily  . nicotine  21 mg Transdermal Daily  . pantoprazole  40 mg Oral Daily  . potassium chloride  40 mEq Oral Once  . propranolol  10 mg Oral TID  . sodium chloride  3 mL Intravenous Q12H  . thiamine  100 mg Oral Daily   Or  . thiamine  100 mg Intravenous Daily  . traZODone  50 mg Oral QHS  . vitamin B-12  100 mcg Oral Daily   Continuous Infusions:     Time spent: 25 minutes    Louellen Molder  [facility] Pager [phone] If 7PM-7AM, please contact night-coverage at [email or web address], password [facility] 05/30/2014, 1:47 PM  LOS: 5 days

## 2014-05-31 LAB — CULTURE, BLOOD (ROUTINE X 2)
Culture: NO GROWTH
Culture: NO GROWTH

## 2014-05-31 MED ORDER — HYDROCODONE-ACETAMINOPHEN 5-325 MG PO TABS: 1.0000 | ORAL_TABLET | Freq: Four times a day (QID) | ORAL | Status: DC | PRN

## 2014-05-31 MED ORDER — CLINDAMYCIN HCL 150 MG PO CAPS: 150.0000 mg | ORAL_CAPSULE | Freq: Four times a day (QID) | ORAL | Status: DC

## 2014-05-31 MED ORDER — LEVOFLOXACIN 500 MG PO TABS: 500.0000 mg | ORAL_TABLET | Freq: Every day | ORAL | Status: DC

## 2014-05-31 MED ORDER — POTASSIUM CHLORIDE CRYS ER 20 MEQ PO TBCR: 20.0000 meq | EXTENDED_RELEASE_TABLET | Freq: Every day | ORAL | Status: DC

## 2014-05-31 MED ORDER — POTASSIUM CHLORIDE CRYS ER 20 MEQ PO TBCR
40.0000 meq | EXTENDED_RELEASE_TABLET | Freq: Once | ORAL | Status: AC
  Administered 2014-05-31: 40 meq via ORAL
  Filled 2014-05-31: qty 2

## 2014-05-31 NOTE — Progress Notes (Signed)
Report called to [facility]. Writer spoke with Jacobo Forest, LPN. No questions at this time. Number left for staff if they should have questions.

## 2014-05-31 NOTE — Progress Notes (Signed)
Clinical Social Work  CSW faxed DC summary to [facility] who is agreeable to accept today after 2pm. CSW prepared DC packet with FL2, DC summary, and hard scripts included. CSW informed patient and mom at bedside of DC plans. Patient is happy to be DC today and will update mom when he is leaving the hospital. Patient agreeable to [facility] and aware of no guarantee of payment. CSW arranged for PTAR for 2pm. PTAR request #: [phone].  CSW is signing off but available if needed.  [place], [place] [phone]

## 2014-05-31 NOTE — Discharge Summary (Signed)
Physician Discharge Summary  Peter [name] KMQ:[id number] DOB: Jan 16, 1951 DOA: 05/25/2014  PCP: Lorayne Marek, MD  Admit date: 05/25/2014 Discharge date: 05/31/2014  Recommendations for Outpatient Follow-up:   Pt will need to follow up with PCP in 2-3 weeks post discharge  Please obtain BMP to evaluate electrolytes and kidney function, potassium level   Please note that pt was give one dose of K-dur 40 MEQ prior to discharge and will continue taking K-dur 20 MEQ daily as noted below   Please also check CBC to evaluate Hg and Hct levels  Please note that pt needs to continue taking Clindamycin 150 mg Q6 hours for 6 more days post discharge, stop date 06/05/2014  Please note that pt has known combined CHF with EF 30 - 35% per last 2 D ECHO 10/2013, due to significant diuresis on Lasix 40 mg IV QD, pt was transitioned to oral Lasix 40 mg PO to continue taking upon discharge  Pt will need close monitoring of renal function, potassium level, weight as he is on Lasix  Weight 11/25 recorded 143 lbs  Discharge Diagnoses:  Active Problems:   Acute alcohol intoxication   COPD (chronic obstructive pulmonary disease)   Hyponatremia   Cirrhosis   Hypokalemia   Hypomagnesemia   Aspiration pneumonia   Sepsis   Fever   Alcohol abuse   Malnutrition of moderate degree   Discharge Condition: Stable  Diet recommendation: Heart healthy diet discussed in details    Brief narrative: 63 year old male with history of alcoholism, COPD, chronic systolic and diastolic CHF cirrhosis of liver admitted with severe sepsis possibly in the setting of UTI. Patient on admission found to have severe hyponatremia, was in alcohol intoxication and acute renal failure. Patient on empiric antibiotics tapered down to oral Levaquin since 11/22 but was still having temp spike and was concerning for aspiration PNA. ABX changed to Clindamycin. No additional fevers since 11/24. Pt ready for discharge to SNF on oral  Clindamycin until 06/05/2014.   Assessment/Plan: Severe sepsis Present on admission and now resolved. Required monitoring in ICU on admission. Placed on empiric vancomycin and Zosyn. Urine culture growing lactobacillus species. Transitioned to Levaquin on 11/22. patient however having fever spikes past 48 hrs, MAXIMUM TEMPERATURE of 102.76F, overnight )11/22). Symptoms concerning for aspiration.Marland Kitchen Antibiotic switched to IV clindamycin 600 mg every 8 hours on 11/23. Chest x-ray done on 11/23 showing left lung base infiltrate. -Seen by swallow eval and does not show any signs of aspiration. -repeat blood cx sent on 11/24 adn negative to date  Patient clinically appears much improved. Transition to oral Clinda upon discharge   Acute hypoxic respiratory failure Possibly due to sepsis with mild exacerbation of CHF. Resolved with dose of IV Lasix. Continue oral Lasix. Continue Bronchodilators   Acute on chronic combined systolic and diastolic CHF Symptoms improved with IV Lasix. Continue home oral Lasix. Last 2-D echo from April 2015 with EF of 30-35% with grade 1 diastolic dysfunction. Monitor daily weights  Weight on discharge 143 lbs  Acute on chronic pancreatitis Elevated lipase on admission. Trending down now. asymptomatic Counseled on EtOH cessation  Alcohol abuse with cirrhosis of liver Patient activity drinks heavily. Reports drinking 2 Forty oz liquor daily. Monitor on CIWA. Continue thiamine, folate and multivitamin. No signs of withdrawal  Hyponatremia Possibly due to dehydration and sepsis . Resolved with IV fluids  Protein calorie malnutrition and failure to thrive Seen by physical therapy and recommended skilled nursing facility. Added ensure supplement  Tobacco abuse Counseled  on cessation. Continue nicotine patch  GERD Continue PPI  Hypokalemia Replenish KCl.    Code Status: Full code Family Communication: Sister at bedside Disposition Plan: Skilled nursing  facility    Consultants:  None  Procedures:  None  Antibiotics:  IV Vanco and Zosyn 11/19-11/22  Levaquin: 11/22-11/23  Clindamycin 11/23--(stop date 11/30 to complete 14 days abx)  Discharge Exam: Filed Vitals:   05/31/14 0609  BP: 124/77  Pulse: 90  Temp: 98.9 F (37.2 C)  Resp: 18   Filed Vitals:   05/30/14 1501 05/30/14 2024 05/30/14 2217 05/31/14 0609  BP:   123/76 124/77  Pulse:   95 90  Temp:   99.9 F (37.7 C) 98.9 F (37.2 C)  TempSrc:   Oral Oral  Resp:   20 18  Height:      Weight:    65 kg (143 lb 4.8 oz)  SpO2: 98% 97% 99% 99%    General: Pt is alert, follows commands appropriately, not in acute distress Cardiovascular: Regular rate and rhythm, no rubs, no gallops Respiratory: Clear to auscultation bilaterally, no wheezing, minimal rhonchi at bases  Abdominal: Soft, non tender, non distended, bowel sounds +, no guarding Extremities: Trace bilateral LE pitting edema, no cyanosis, pulses palpable bilaterally DP and PT  Discharge Instructions  Discharge Instructions    Diet - low sodium heart healthy    Complete by:  As directed      Increase activity slowly    Complete by:  As directed             Medication List    TAKE these medications        acamprosate 333 MG tablet  Commonly known as:  CAMPRAL  Take 2 tablets (666 mg total) by mouth 3 (three) times daily with meals.     albuterol 108 (90 BASE) MCG/ACT inhaler  Commonly known as:  PROVENTIL HFA;VENTOLIN HFA  Inhale 2 puffs into the lungs 4 (four) times daily.     atorvastatin 80 MG tablet  Commonly known as:  LIPITOR  Take 1 tablet (80 mg total) by mouth daily.     brimonidine 0.2 % ophthalmic solution  Commonly known as:  ALPHAGAN  Place 1 drop into both eyes 2 (two) times daily.     budesonide-formoterol 80-4.5 MCG/ACT inhaler  Commonly known as:  SYMBICORT  Inhale 2 puffs into the lungs 2 (two) times daily.     clindamycin 150 MG capsule  Commonly known as:   CLEOCIN  Take 1 capsule (150 mg total) by mouth 4 (four) times daily.     clotrimazole 1 % cream  Commonly known as:  LOTRIMIN  Apply topically 2 (two) times daily.     ferrous sulfate 325 (65 FE) MG tablet  Take 1 tablet (325 mg total) by mouth daily with breakfast. For low iron.     folic acid 1 MG tablet  Commonly known as:  FOLVITE  Take 1 tablet (1 mg total) by mouth daily.     furosemide 40 MG tablet  Commonly known as:  LASIX  Take 1 tablet (40 mg total) by mouth daily.     HYDROcodone-acetaminophen 5-325 MG per tablet  Commonly known as:  NORCO/VICODIN  Take 1 tablet by mouth every 6 (six) hours as needed for moderate pain.     hydrOXYzine 25 MG tablet  Commonly known as:  ATARAX/VISTARIL  Take 1 tablet (25 mg total) by mouth 3 (three) times daily as needed for anxiety.  ipratropium-albuterol 0.5-2.5 (3) MG/3ML Soln  Commonly known as:  DUONEB  Take 3 mLs by nebulization every 4 (four) hours as needed.     multivitamin tablet  Take 1 tablet by mouth daily.     nicotine 21 mg/24hr patch  Commonly known as:  NICODERM CQ  Place 1 patch (21 mg total) onto the skin daily.     pantoprazole 40 MG tablet  Commonly known as:  PROTONIX  Take 1 tablet (40 mg total) by mouth daily.     potassium chloride SA 20 MEQ tablet  Commonly known as:  K-DUR,KLOR-CON  Take 1 tablet (20 mEq total) by mouth daily.     propranolol 10 MG tablet  Commonly known as:  INDERAL  Take 1 tablet (10 mg total) by mouth 3 (three) times daily.     thiamine 100 MG tablet  Commonly known as:  VITAMIN B-1  Take 1 tablet (100 mg total) by mouth daily. For vitamin B deficiency.     traZODone 50 MG tablet  Commonly known as:  DESYREL  Take 1 tablet (50 mg total) by mouth at bedtime.     vitamin B-12 100 MCG tablet  Commonly known as:  CYANOCOBALAMIN  Take 1 tablet (100 mcg total) by mouth daily. For low B12.     vitamin E 200 UNIT capsule  Take 1 capsule (200 Units total) by mouth daily.             Follow-up Information    Follow up with Lorayne Marek, MD.   Specialty:  Internal Medicine   Contact information:   [place] [address] [phone]       Follow up with Faye Ramsay, MD.   Specialty:  Internal Medicine   Why:  As needed, If symptoms worsen   Contact information:   [address] [place] [address] [phone]        The results of significant diagnostics from this hospitalization (including imaging, microbiology, ancillary and laboratory) are listed below for reference.     Microbiology: Recent Results (from the past 240 hour(s))  Blood culture (routine x 2)     Status: None   Collection Time: 05/25/14  1:45 AM  Result Value Ref Range Status   Specimen Description BLOOD RIGHT ANTECUBITAL  Final   Special Requests BOTTLES DRAWN AEROBIC AND ANAEROBIC 3 ML EACH  Final   Culture  Setup Time   Final    05/25/2014 12:41 Performed at [place]   Final    NO GROWTH 5 DAYS Performed at [facility]    Report Status 05/31/2014 FINAL  Final  Blood culture (routine x 2)     Status: None   Collection Time: 05/25/14  1:45 AM  Result Value Ref Range Status   Specimen Description BLOOD  L HAND  Final   Special Requests BOTTLES DRAWN AEROBIC AND ANAEROBIC 5 CC EACH  Final   Culture  Setup Time   Final    05/25/2014 12:41 Performed at [facility]    Culture   Final    NO GROWTH 5 DAYS Performed at [facility]    Report Status 05/31/2014 FINAL  Final  Urine culture     Status: None   Collection Time: 05/25/14  3:12 AM  Result Value Ref Range Status   Specimen Description URINE, CATHETERIZED  Final   Special Requests NONE  Final   Culture  Setup  Time   Final    05/25/2014 12:40 Performed at [place]   Final    >=100,000 COLONIES/ML Performed at [facility]    Culture   Final    LACTOBACILLUS  SPECIES Note: Standardized susceptibility testing for this organism is not available. Performed at [facility]    Report Status 05/26/2014 FINAL  Final  MRSA PCR Screening     Status: None   Collection Time: 05/25/14  6:32 AM  Result Value Ref Range Status   MRSA by PCR NEGATIVE NEGATIVE Final    Comment:        The GeneXpert MRSA Assay (FDA approved for NASAL specimens only), is one component of a comprehensive MRSA colonization surveillance program. It is not intended to diagnose MRSA infection nor to guide or monitor treatment for MRSA infections.   Culture, blood (routine x 2)     Status: None (Preliminary result)   Collection Time: 05/30/14  7:50 AM  Result Value Ref Range Status   Specimen Description BLOOD RIGHT ARM  Final   Special Requests BOTTLES DRAWN AEROBIC AND ANAEROBIC [place]  Final   Culture  Setup Time   Final    05/30/2014 10:37 Performed at [facility]    Culture   Final           BLOOD CULTURE RECEIVED NO GROWTH TO DATE CULTURE WILL BE HELD FOR 5 DAYS BEFORE ISSUING A FINAL NEGATIVE REPORT Performed at [facility]    Report Status PENDING  Incomplete  Culture, blood (routine x 2)     Status: None (Preliminary result)   Collection Time: 05/30/14  8:00 AM  Result Value Ref Range Status   Specimen Description BLOOD RIGHT HAND  Final   Special Requests BOTTLES DRAWN AEROBIC AND ANAEROBIC [place]  Final   Culture  Setup Time   Final    05/30/2014 10:37 Performed at [facility]    Culture   Final           BLOOD CULTURE RECEIVED NO GROWTH TO DATE CULTURE WILL BE HELD FOR 5 DAYS BEFORE ISSUING A FINAL NEGATIVE REPORT Performed at [facility]    Report Status PENDING  Incomplete     Labs: Basic Metabolic Panel:  Recent Labs Lab 05/26/14 0335 05/27/14 0325 05/27/14 0334 05/28/14 0500 05/29/14 0525 05/30/14 0500  NA 128*  --  128* 132* 136* 137  K 3.4*  --  3.0* 4.3 3.0* 3.5*  CL 92*  --  94* 95*  97 99  CO2 20  --  21 19 23 23   GLUCOSE 89  --  110* 103* 113* 98  BUN 18  --  10 9 10 14   CREATININE 1.26  --  1.06 0.93 1.07 1.02  CALCIUM 9.1  --  8.9 9.4 9.5 9.4  MG  --  1.4*  --  1.5 1.4*  --    Liver Function Tests:  Recent Labs Lab 05/25/14 0145 05/25/14 0810 05/27/14 0334 05/29/14 0525  AST 99* 81* 37 29  ALT 46 39 23 17  ALKPHOS 107 99 66 61  BILITOT 0.4 0.5 0.5 0.5  PROT 8.7* 8.0 6.8 7.2  ALBUMIN 4.0 3.7 3.1* 2.9*    Recent Labs Lab 05/25/14 0145 05/27/14 0334 05/29/14 0525  LIPASE 157* 89* 44    Recent Labs Lab 05/25/14 0810  AMMONIA 42   CBC:  Recent Labs Lab 05/25/14 0810 05/26/14 0335 05/27/14  2947 05/28/14 0500 05/29/14 0525  WBC 2.9* 3.5* 6.1 5.5 8.1  NEUTROABS 1.9  --   --   --   --   HGB 9.0* 8.2* 7.9* 8.9* 8.4*  HCT 26.1* 23.4* 23.3* 27.1* 25.5*  MCV 76.5* 78.3 78.7 79.2 80.4  PLT 100* 97* 100* 133* 155   Cardiac Enzymes:  Recent Labs Lab 05/25/14 0810  CKTOTAL 310*  TROPONINI <0.30   BNP: BNP (last 3 results)  Recent Labs  11/02/13 0736 05/28/14 0500  PROBNP 12677.0* 6527.0*   CBG:  Recent Labs Lab 05/25/14 0042  GLUCAP 74   SIGNED: Time coordinating discharge: Over 30 minutes  Faye Ramsay, MD  [facility] 05/31/2014, 8:53 AM Pager [phone]  If 7PM-7AM, please contact night-coverage [email or web address] Password [email or web address]

## 2014-06-05 LAB — CULTURE, BLOOD (ROUTINE X 2)
CULTURE: NO GROWTH
CULTURE: NO GROWTH

## 2014-07-10 ENCOUNTER — Ambulatory Visit: Payer: Self-pay | Admitting: Internal Medicine

## 2014-07-17 ENCOUNTER — Encounter: Payer: Self-pay | Admitting: Internal Medicine

## 2014-07-17 ENCOUNTER — Ambulatory Visit: Payer: Medicare Other | Attending: Internal Medicine | Admitting: Internal Medicine

## 2014-07-17 ENCOUNTER — Ambulatory Visit (HOSPITAL_COMMUNITY)
Admission: RE | Admit: 2014-07-17 | Discharge: 2014-07-17 | Disposition: A | Discharge status: Home / Self Care | Payer: Medicare Other | Source: Ambulatory Visit | Attending: Internal Medicine | Admitting: Internal Medicine

## 2014-07-17 VITALS — BP 121/76 | HR 92 | Temp 98.0°F | Resp 16 | Wt 149.0 lb

## 2014-07-17 DIAGNOSIS — Z720 Tobacco use: Secondary | ICD-10-CM

## 2014-07-17 DIAGNOSIS — R918 Other nonspecific abnormal finding of lung field: Secondary | ICD-10-CM | POA: Diagnosis not present

## 2014-07-17 DIAGNOSIS — E119 Type 2 diabetes mellitus without complications: Secondary | ICD-10-CM | POA: Diagnosis not present

## 2014-07-17 DIAGNOSIS — Z8701 Personal history of pneumonia (recurrent): Secondary | ICD-10-CM | POA: Diagnosis not present

## 2014-07-17 DIAGNOSIS — F1020 Alcohol dependence, uncomplicated: Secondary | ICD-10-CM | POA: Diagnosis not present

## 2014-07-17 DIAGNOSIS — F419 Anxiety disorder, unspecified: Secondary | ICD-10-CM | POA: Diagnosis not present

## 2014-07-17 DIAGNOSIS — J449 Chronic obstructive pulmonary disease, unspecified: Secondary | ICD-10-CM | POA: Insufficient documentation

## 2014-07-17 DIAGNOSIS — J40 Bronchitis, not specified as acute or chronic: Secondary | ICD-10-CM | POA: Diagnosis not present

## 2014-07-17 DIAGNOSIS — I10 Essential (primary) hypertension: Secondary | ICD-10-CM | POA: Insufficient documentation

## 2014-07-17 DIAGNOSIS — F17210 Nicotine dependence, cigarettes, uncomplicated: Secondary | ICD-10-CM | POA: Diagnosis not present

## 2014-07-17 DIAGNOSIS — R6889 Other general symptoms and signs: Secondary | ICD-10-CM | POA: Insufficient documentation

## 2014-07-17 DIAGNOSIS — F1010 Alcohol abuse, uncomplicated: Secondary | ICD-10-CM

## 2014-07-17 DIAGNOSIS — F17200 Nicotine dependence, unspecified, uncomplicated: Secondary | ICD-10-CM

## 2014-07-17 DIAGNOSIS — Z79899 Other long term (current) drug therapy: Secondary | ICD-10-CM | POA: Insufficient documentation

## 2014-07-17 DIAGNOSIS — F329 Major depressive disorder, single episode, unspecified: Secondary | ICD-10-CM | POA: Diagnosis not present

## 2014-07-17 DIAGNOSIS — N390 Urinary tract infection, site not specified: Secondary | ICD-10-CM | POA: Diagnosis not present

## 2014-07-17 DIAGNOSIS — F32A Depression, unspecified: Secondary | ICD-10-CM

## 2014-07-17 DIAGNOSIS — K7030 Alcoholic cirrhosis of liver without ascites: Secondary | ICD-10-CM | POA: Diagnosis not present

## 2014-07-17 DIAGNOSIS — R079 Chest pain, unspecified: Secondary | ICD-10-CM | POA: Diagnosis not present

## 2014-07-17 DIAGNOSIS — E785 Hyperlipidemia, unspecified: Secondary | ICD-10-CM | POA: Insufficient documentation

## 2014-07-17 LAB — COMPLETE METABOLIC PANEL WITH GFR
ALK PHOS: 60 U/L (ref 39–117)
ALT: <8 U/L (ref 0–53)
AST: 13 U/L (ref 0–37)
Albumin: 4.2 g/dL (ref 3.5–5.2)
BUN: 7 mg/dL (ref 6–23)
CALCIUM: 9.6 mg/dL (ref 8.4–10.5)
CHLORIDE: 97 meq/L (ref 96–112)
CO2: 23 mEq/L (ref 19–32)
Creat: 0.86 mg/dL (ref 0.50–1.35)
GFR, Est Non African American: >89 mL/min
GLUCOSE: 89 mg/dL (ref 70–99)
Potassium: 4.5 mEq/L (ref 3.5–5.3)
Sodium: 133 mEq/L — ABNORMAL LOW (ref 135–145)
TOTAL PROTEIN: 7.5 g/dL (ref 6.0–8.3)
Total Bilirubin: 0.4 mg/dL (ref 0.2–1.2)

## 2014-07-17 MED ORDER — FERROUS SULFATE 325 (65 FE) MG PO TABS: 325.0000 mg | ORAL_TABLET | Freq: Every day | ORAL | Status: DC

## 2014-07-17 MED ORDER — FOLIC ACID 1 MG PO TABS: 1.0000 mg | ORAL_TABLET | Freq: Every day | ORAL | Status: DC

## 2014-07-17 MED ORDER — PANTOPRAZOLE SODIUM 40 MG PO TBEC: 40.0000 mg | DELAYED_RELEASE_TABLET | Freq: Every day | ORAL | Status: DC

## 2014-07-17 MED ORDER — POTASSIUM CHLORIDE CRYS ER 20 MEQ PO TBCR: 20.0000 meq | EXTENDED_RELEASE_TABLET | Freq: Every day | ORAL | Status: DC

## 2014-07-17 MED ORDER — TRAZODONE HCL 50 MG PO TABS: 50.0000 mg | ORAL_TABLET | Freq: Every day | ORAL | Status: DC

## 2014-07-17 MED ORDER — BUDESONIDE-FORMOTEROL FUMARATE 80-4.5 MCG/ACT IN AERO: 2.0000 | INHALATION_SPRAY | Freq: Two times a day (BID) | RESPIRATORY_TRACT | Status: DC

## 2014-07-17 MED ORDER — ATORVASTATIN CALCIUM 80 MG PO TABS: 80.0000 mg | ORAL_TABLET | Freq: Every day | ORAL | Status: DC

## 2014-07-17 MED ORDER — ALBUTEROL SULFATE HFA 108 (90 BASE) MCG/ACT IN AERS: 2.0000 | INHALATION_SPRAY | Freq: Four times a day (QID) | RESPIRATORY_TRACT | Status: DC

## 2014-07-17 MED ORDER — PROPRANOLOL HCL 10 MG PO TABS: 10.0000 mg | ORAL_TABLET | Freq: Three times a day (TID) | ORAL | Status: DC

## 2014-07-17 MED ORDER — ACAMPROSATE CALCIUM 333 MG PO TBEC: 666.0000 mg | DELAYED_RELEASE_TABLET | Freq: Three times a day (TID) | ORAL | Status: DC

## 2014-07-17 MED ORDER — HYDROXYZINE HCL 25 MG PO TABS: 25.0000 mg | ORAL_TABLET | Freq: Three times a day (TID) | ORAL | Status: DC | PRN

## 2014-07-17 MED ORDER — FUROSEMIDE 40 MG PO TABS: 40.0000 mg | ORAL_TABLET | Freq: Every day | ORAL | Status: DC

## 2014-07-17 NOTE — Progress Notes (Signed)
MRN: [id number] Name: Peter [name]  Sex: male Age: 64 y.o. DOB: Jul 23, 1950  Allergies: Poison ivy extract  Chief Complaint  Patient presents with  . Follow-up    pnuemonia    HPI: Patient is 64 y.o. male who  Has History of chronic alcohol use, COPD systolic CHF cirrhosis of liver, he was hospitalized 6 weeks ago with symptoms of sepsis and UTI also had  x-ray findings concerning for pneumonia patient was treated with antibiotic and symptomatically improved, patient comes today for followup and requesting refill on his medications, patient is to smoke cigarettes, advised patient to quit smoking, patient does report occasional cough denies any fever chills nausea vomiting change in bowel habits. , Past Medical History  Diagnosis Date  . Hypertension   . Hyperlipemia   . Emphysema   . Stroke   . Chronic pain   . Cirrhosis of liver   . Fall   . Dental injury   . Bone spur of other site     Left Foot  . Impaired mobility   . Total self care deficit   . Pneumonia   . Emphysema   . ETOH abuse   . Elevated LFTs   . Arthritis   . Anxiety   . Sinusitis   . Anemia     iron def.  . Hard of hearing   . Diabetes mellitus without complication   . COPD (chronic obstructive pulmonary disease)   . Depression   . Glaucoma   . Emphysema of lung   . Cataract     Past Surgical History  Procedure Laterality Date  . Colonoscopy  01/06/2012    Procedure: COLONOSCOPY;  Surgeon: Beryle Beams, MD;  Location: [facility] ENDOSCOPY;  Service: Endoscopy;  Laterality: N/A;  . Esophagogastroduodenoscopy  01/06/2012    Procedure: ESOPHAGOGASTRODUODENOSCOPY (EGD);  Surgeon: Beryle Beams, MD;  Location: [facility] ENDOSCOPY;  Service: Endoscopy;  Laterality: N/A;  . Esophagogastroduodenoscopy  01/09/2012    Procedure: ESOPHAGOGASTRODUODENOSCOPY (EGD);  Surgeon: Beryle Beams, MD;  Location: [facility] ENDOSCOPY;  Service: Endoscopy;  Laterality: N/A;  . Multiple extractions with alveoloplasty  03/29/2012   Procedure: MULTIPLE EXTRACION WITH ALVEOLOPLASTY;  Surgeon: Gae Bon, DDS;  Location: [place];  Service: Oral Surgery;  Laterality: Bilateral;  . Bone spur  2013    left spur  . Cataract extraction w/ intraocular lens implant    . Mouth surgery    . Colonoscopy        Medication List       This list is accurate as of: 07/17/14  6:02 PM.  Always use your most recent med list.               acamprosate 333 MG tablet  Commonly known as:  CAMPRAL  Take 2 tablets (666 mg total) by mouth 3 (three) times daily with meals.     albuterol 108 (90 BASE) MCG/ACT inhaler  Commonly known as:  PROVENTIL HFA;VENTOLIN HFA  Inhale 2 puffs into the lungs 4 (four) times daily.     atorvastatin 80 MG tablet  Commonly known as:  LIPITOR  Take 1 tablet (80 mg total) by mouth daily.     brimonidine 0.2 % ophthalmic solution  Commonly known as:  ALPHAGAN  Place 1 drop into both eyes 2 (two) times daily.     budesonide-formoterol 80-4.5 MCG/ACT inhaler  Commonly known as:  SYMBICORT  Inhale 2 puffs into the lungs 2 (two) times daily.  clindamycin 150 MG capsule  Commonly known as:  CLEOCIN  Take 1 capsule (150 mg total) by mouth 4 (four) times daily.     clotrimazole 1 % cream  Commonly known as:  LOTRIMIN  Apply topically 2 (two) times daily.     ferrous sulfate 325 (65 FE) MG tablet  Take 1 tablet (325 mg total) by mouth daily with breakfast. For low iron.     folic acid 1 MG tablet  Commonly known as:  FOLVITE  Take 1 tablet (1 mg total) by mouth daily.     furosemide 40 MG tablet  Commonly known as:  LASIX  Take 1 tablet (40 mg total) by mouth daily.     HYDROcodone-acetaminophen 5-325 MG per tablet  Commonly known as:  NORCO/VICODIN  Take 1 tablet by mouth every 6 (six) hours as needed for moderate pain.     hydrOXYzine 25 MG tablet  Commonly known as:  ATARAX/VISTARIL  Take 1 tablet (25 mg total) by mouth 3 (three) times daily as needed for anxiety.      ipratropium-albuterol 0.5-2.5 (3) MG/3ML Soln  Commonly known as:  DUONEB  Take 3 mLs by nebulization every 4 (four) hours as needed.     multivitamin tablet  Take 1 tablet by mouth daily.     nicotine 21 mg/24hr patch  Commonly known as:  NICODERM CQ  Place 1 patch (21 mg total) onto the skin daily.     pantoprazole 40 MG tablet  Commonly known as:  PROTONIX  Take 1 tablet (40 mg total) by mouth daily.     potassium chloride SA 20 MEQ tablet  Commonly known as:  K-DUR,KLOR-CON  Take 1 tablet (20 mEq total) by mouth daily.     propranolol 10 MG tablet  Commonly known as:  INDERAL  Take 1 tablet (10 mg total) by mouth 3 (three) times daily.     thiamine 100 MG tablet  Commonly known as:  VITAMIN B-1  Take 1 tablet (100 mg total) by mouth daily. For vitamin B deficiency.     traZODone 50 MG tablet  Commonly known as:  DESYREL  Take 1 tablet (50 mg total) by mouth at bedtime.     vitamin B-12 100 MCG tablet  Commonly known as:  CYANOCOBALAMIN  Take 1 tablet (100 mcg total) by mouth daily. For low B12.     vitamin E 200 UNIT capsule  Take 1 capsule (200 Units total) by mouth daily.        Meds ordered this encounter  Medications  . acamprosate (CAMPRAL) 333 MG tablet    Sig: Take 2 tablets (666 mg total) by mouth 3 (three) times daily with meals.    Dispense:  180 tablet    Refill:  2  . albuterol (PROVENTIL HFA;VENTOLIN HFA) 108 (90 BASE) MCG/ACT inhaler    Sig: Inhale 2 puffs into the lungs 4 (four) times daily.    Dispense:  1 Inhaler    Refill:  2  . atorvastatin (LIPITOR) 80 MG tablet    Sig: Take 1 tablet (80 mg total) by mouth daily.    Dispense:  30 tablet    Refill:  2  . budesonide-formoterol (SYMBICORT) 80-4.5 MCG/ACT inhaler    Sig: Inhale 2 puffs into the lungs 2 (two) times daily.    Dispense:  1 Inhaler    Refill:  2  . ferrous sulfate 325 (65 FE) MG tablet    Sig: Take 1 tablet (325 mg total)  by mouth daily with breakfast. For low iron.     Dispense:  30 tablet    Refill:  2  . folic acid (FOLVITE) 1 MG tablet    Sig: Take 1 tablet (1 mg total) by mouth daily.    Dispense:  30 tablet    Refill:  2  . furosemide (LASIX) 40 MG tablet    Sig: Take 1 tablet (40 mg total) by mouth daily.    Dispense:  30 tablet    Refill:  2  . hydrOXYzine (ATARAX/VISTARIL) 25 MG tablet    Sig: Take 1 tablet (25 mg total) by mouth 3 (three) times daily as needed for anxiety.    Dispense:  30 tablet    Refill:  2  . pantoprazole (PROTONIX) 40 MG tablet    Sig: Take 1 tablet (40 mg total) by mouth daily.    Dispense:  30 tablet    Refill:  2  . potassium chloride SA (K-DUR,KLOR-CON) 20 MEQ tablet    Sig: Take 1 tablet (20 mEq total) by mouth daily.    Dispense:  30 tablet    Refill:  2  . propranolol (INDERAL) 10 MG tablet    Sig: Take 1 tablet (10 mg total) by mouth 3 (three) times daily.    Dispense:  90 tablet    Refill:  3  . traZODone (DESYREL) 50 MG tablet    Sig: Take 1 tablet (50 mg total) by mouth at bedtime.    Dispense:  30 tablet    Refill:  0    Immunization History  Administered Date(s) Administered  . Influenza Split 06/07/2013  . Influenza,inj,Quad PF,36+ Mos 05/26/2014  . Pneumococcal Polysaccharide-23 01/04/2012, 05/26/2014    Family History  Problem Relation Age of Onset  . Breast cancer Mother   . Diabetes Maternal Aunt   . Heart disease Sister   . Heart disease Maternal Aunt     History  Substance Use Topics  . Smoking status: Current Every Day Smoker -- 1.00 packs/day for 30 years    Types: Cigarettes  . Smokeless tobacco: Never Used  . Alcohol Use: 2.4 oz/week    4 Cans of beer per week     Comment: per pt  2 40oz daily beer. Last drink: 2 hrs ago    Review of Systems   As noted in HPI  Filed Vitals:   07/17/14 1456  BP: 121/76  Pulse: 92  Temp: 98 F (36.7 C)  Resp: 16    Physical Exam  Physical Exam  Constitutional: No distress.  Eyes: EOM are normal. Pupils are equal, round,  and reactive to light.  Cardiovascular: Normal rate and regular rhythm.   Pulmonary/Chest: Breath sounds normal. No respiratory distress. He has no wheezes. He has no rales.  Abdominal: Soft. There is no tenderness. There is no rebound.  Musculoskeletal: He exhibits no edema.    CBC    Component Value Date/Time   WBC 8.1 05/29/2014 0525   RBC 3.17* 05/29/2014 0525   RBC 2.75* 10/31/2013 0400   HGB 8.4* 05/29/2014 0525   HCT 25.5* 05/29/2014 0525   PLT 155 05/29/2014 0525   MCV 80.4 05/29/2014 0525   LYMPHSABS 0.5* 05/25/2014 0810   MONOABS 0.5 05/25/2014 0810   EOSABS 0.0 05/25/2014 0810   BASOSABS 0.0 05/25/2014 0810    CMP     Component Value Date/Time   NA 137 05/30/2014 0500   K 3.5* 05/30/2014 0500   CL 99 05/30/2014 0500  CO2 23 05/30/2014 0500   GLUCOSE 98 05/30/2014 0500   BUN 14 05/30/2014 0500   CREATININE 1.02 05/30/2014 0500   CREATININE 0.86 12/15/2013 1037   CALCIUM 9.4 05/30/2014 0500   PROT 7.2 05/29/2014 0525   ALBUMIN 2.9* 05/29/2014 0525   AST 29 05/29/2014 0525   ALT 17 05/29/2014 0525   ALKPHOS 61 05/29/2014 0525   BILITOT 0.5 05/29/2014 0525   GFRNONAA 77* 05/30/2014 0500   GFRNONAA >89 12/15/2013 1037   GFRAA 90* 05/30/2014 0500   GFRAA >89 12/15/2013 1037    No results found for: CHOL  No components found for: HGA1C  Lab Results  Component Value Date/Time   AST 29 05/29/2014 05:25 AM    Assessment and Plan  Chronic alcohol abuse - counseled patient to cut down on drinking, prescribed multivitamin folic acid.  Alcoholic cirrhosis of liver without ascites - Plan: patient is currently on propranolol.  Depression (emotion) - Plan: , traZODone (DESYREL) 50 MG tablet  COPD, moderate - Plan:patient is currently on Symbicort and albuterol when necessary, again counseled patient to quit smoking.  Essential hypertension - Plan: furosemide (LASIX) 40 MG tablet, COMPLETE METABOLIC PANEL WITH GFR  History of pneumonia - Plan: will repeat  DG Chest 2 View  UTI (lower urinary tract infection) Treated with antibiotic.  Smoker Advised patient to quit smoking.    Return in about 3 months (around 10/16/2014) for hypertension.  Lorayne Marek, MD

## 2014-07-17 NOTE — Progress Notes (Signed)
Patient here for follow up Had been hospitalized for pneumonia Patient requesting refills on his medications

## 2014-07-18 ENCOUNTER — Telehealth: Payer: Self-pay | Admitting: Emergency Medicine

## 2014-07-18 ENCOUNTER — Telehealth: Payer: Self-pay

## 2014-07-18 MED ORDER — AZITHROMYCIN 250 MG PO TABS: ORAL_TABLET | ORAL | Status: DC

## 2014-07-18 NOTE — Telephone Encounter (Signed)
-----   Message from Lorayne Marek, MD sent at 07/18/2014  9:25 AM EST ----- Call and let the patient know that his chest x-ray is consistent with COPD and there is possibility of superimposed acute bronchitis, there is no alveolar pneumonia or CHF, advise patient to take Zithromax for 5 days, send prescription to his pharmacy

## 2014-07-18 NOTE — Telephone Encounter (Signed)
Patient is aware of his x ray results Prescription sent to [facility] on file

## 2014-07-20 ENCOUNTER — Encounter (HOSPITAL_COMMUNITY): Payer: Self-pay | Admitting: Gastroenterology

## 2014-08-07 ENCOUNTER — Telehealth: Payer: Self-pay | Admitting: Internal Medicine

## 2014-08-07 NOTE — Telephone Encounter (Signed)
Has 3 albuterol medications:  Pro Air, Ventolin Inhaler, and one to put in a nebulizer but patient doesn't have a nebulizer; please f/u with patient as to which medications he should be taking [phone]

## 2014-11-18 ENCOUNTER — Inpatient Hospital Stay (HOSPITAL_COMMUNITY)
Admission: EM | Admit: 2014-11-18 | Discharge: 2014-11-23 | DRG: 640 | Disposition: A | Discharge status: Skilled Nursing Facility | Payer: Medicare Other | Attending: Internal Medicine | Admitting: Internal Medicine

## 2014-11-18 ENCOUNTER — Encounter (HOSPITAL_COMMUNITY): Payer: Self-pay | Admitting: *Deleted

## 2014-11-18 ENCOUNTER — Emergency Department (HOSPITAL_COMMUNITY): Payer: Medicare Other

## 2014-11-18 DIAGNOSIS — F1010 Alcohol abuse, uncomplicated: Secondary | ICD-10-CM | POA: Diagnosis not present

## 2014-11-18 DIAGNOSIS — I5022 Chronic systolic (congestive) heart failure: Secondary | ICD-10-CM | POA: Diagnosis not present

## 2014-11-18 DIAGNOSIS — I426 Alcoholic cardiomyopathy: Secondary | ICD-10-CM | POA: Diagnosis not present

## 2014-11-18 DIAGNOSIS — E119 Type 2 diabetes mellitus without complications: Secondary | ICD-10-CM | POA: Diagnosis present

## 2014-11-18 DIAGNOSIS — E785 Hyperlipidemia, unspecified: Secondary | ICD-10-CM | POA: Diagnosis present

## 2014-11-18 DIAGNOSIS — S72114A Nondisplaced fracture of greater trochanter of right femur, initial encounter for closed fracture: Secondary | ICD-10-CM | POA: Diagnosis present

## 2014-11-18 DIAGNOSIS — Z8673 Personal history of transient ischemic attack (TIA), and cerebral infarction without residual deficits: Secondary | ICD-10-CM

## 2014-11-18 DIAGNOSIS — S72111S Displaced fracture of greater trochanter of right femur, sequela: Secondary | ICD-10-CM | POA: Diagnosis not present

## 2014-11-18 DIAGNOSIS — F10221 Alcohol dependence with intoxication delirium: Secondary | ICD-10-CM | POA: Diagnosis present

## 2014-11-18 DIAGNOSIS — E861 Hypovolemia: Secondary | ICD-10-CM | POA: Diagnosis not present

## 2014-11-18 DIAGNOSIS — F10231 Alcohol dependence with withdrawal delirium: Secondary | ICD-10-CM | POA: Diagnosis not present

## 2014-11-18 DIAGNOSIS — J449 Chronic obstructive pulmonary disease, unspecified: Secondary | ICD-10-CM | POA: Diagnosis present

## 2014-11-18 DIAGNOSIS — H9190 Unspecified hearing loss, unspecified ear: Secondary | ICD-10-CM | POA: Diagnosis not present

## 2014-11-18 DIAGNOSIS — E872 Acidosis: Secondary | ICD-10-CM | POA: Diagnosis not present

## 2014-11-18 DIAGNOSIS — F10229 Alcohol dependence with intoxication, unspecified: Secondary | ICD-10-CM | POA: Diagnosis not present

## 2014-11-18 DIAGNOSIS — F329 Major depressive disorder, single episode, unspecified: Secondary | ICD-10-CM | POA: Diagnosis not present

## 2014-11-18 DIAGNOSIS — K7030 Alcoholic cirrhosis of liver without ascites: Secondary | ICD-10-CM | POA: Diagnosis present

## 2014-11-18 DIAGNOSIS — R404 Transient alteration of awareness: Secondary | ICD-10-CM | POA: Diagnosis not present

## 2014-11-18 DIAGNOSIS — R262 Difficulty in walking, not elsewhere classified: Secondary | ICD-10-CM | POA: Diagnosis not present

## 2014-11-18 DIAGNOSIS — W19XXXA Unspecified fall, initial encounter: Secondary | ICD-10-CM | POA: Diagnosis present

## 2014-11-18 DIAGNOSIS — I10 Essential (primary) hypertension: Secondary | ICD-10-CM | POA: Diagnosis not present

## 2014-11-18 DIAGNOSIS — F05 Delirium due to known physiological condition: Secondary | ICD-10-CM | POA: Diagnosis present

## 2014-11-18 DIAGNOSIS — H409 Unspecified glaucoma: Secondary | ICD-10-CM | POA: Diagnosis present

## 2014-11-18 DIAGNOSIS — F10230 Alcohol dependence with withdrawal, uncomplicated: Secondary | ICD-10-CM | POA: Diagnosis not present

## 2014-11-18 DIAGNOSIS — J439 Emphysema, unspecified: Secondary | ICD-10-CM | POA: Diagnosis not present

## 2014-11-18 DIAGNOSIS — F17210 Nicotine dependence, cigarettes, uncomplicated: Secondary | ICD-10-CM | POA: Diagnosis present

## 2014-11-18 DIAGNOSIS — D638 Anemia in other chronic diseases classified elsewhere: Secondary | ICD-10-CM | POA: Diagnosis not present

## 2014-11-18 DIAGNOSIS — Z886 Allergy status to analgesic agent status: Secondary | ICD-10-CM

## 2014-11-18 DIAGNOSIS — F10939 Alcohol use, unspecified with withdrawal, unspecified: Secondary | ICD-10-CM | POA: Diagnosis present

## 2014-11-18 DIAGNOSIS — F10929 Alcohol use, unspecified with intoxication, unspecified: Secondary | ICD-10-CM

## 2014-11-18 DIAGNOSIS — R55 Syncope and collapse: Secondary | ICD-10-CM | POA: Diagnosis present

## 2014-11-18 DIAGNOSIS — I951 Orthostatic hypotension: Secondary | ICD-10-CM | POA: Diagnosis not present

## 2014-11-18 DIAGNOSIS — M6281 Muscle weakness (generalized): Secondary | ICD-10-CM | POA: Diagnosis not present

## 2014-11-18 DIAGNOSIS — E539 Vitamin B deficiency, unspecified: Secondary | ICD-10-CM | POA: Diagnosis not present

## 2014-11-18 DIAGNOSIS — M1990 Unspecified osteoarthritis, unspecified site: Secondary | ICD-10-CM | POA: Diagnosis not present

## 2014-11-18 DIAGNOSIS — Z79899 Other long term (current) drug therapy: Secondary | ICD-10-CM

## 2014-11-18 DIAGNOSIS — F10239 Alcohol dependence with withdrawal, unspecified: Secondary | ICD-10-CM | POA: Diagnosis not present

## 2014-11-18 DIAGNOSIS — Z8249 Family history of ischemic heart disease and other diseases of the circulatory system: Secondary | ICD-10-CM

## 2014-11-18 DIAGNOSIS — R52 Pain, unspecified: Secondary | ICD-10-CM

## 2014-11-18 DIAGNOSIS — F419 Anxiety disorder, unspecified: Secondary | ICD-10-CM | POA: Diagnosis present

## 2014-11-18 DIAGNOSIS — S72111A Displaced fracture of greater trochanter of right femur, initial encounter for closed fracture: Secondary | ICD-10-CM

## 2014-11-18 DIAGNOSIS — G8929 Other chronic pain: Secondary | ICD-10-CM | POA: Diagnosis present

## 2014-11-18 DIAGNOSIS — F10220 Alcohol dependence with intoxication, uncomplicated: Secondary | ICD-10-CM | POA: Diagnosis not present

## 2014-11-18 DIAGNOSIS — F339 Major depressive disorder, recurrent, unspecified: Secondary | ICD-10-CM | POA: Diagnosis not present

## 2014-11-18 DIAGNOSIS — S72114D Nondisplaced fracture of greater trochanter of right femur, subsequent encounter for closed fracture with routine healing: Secondary | ICD-10-CM | POA: Diagnosis not present

## 2014-11-18 DIAGNOSIS — I6782 Cerebral ischemia: Secondary | ICD-10-CM | POA: Diagnosis not present

## 2014-11-18 DIAGNOSIS — Z79891 Long term (current) use of opiate analgesic: Secondary | ICD-10-CM | POA: Diagnosis not present

## 2014-11-18 DIAGNOSIS — Z833 Family history of diabetes mellitus: Secondary | ICD-10-CM | POA: Diagnosis not present

## 2014-11-18 DIAGNOSIS — D649 Anemia, unspecified: Secondary | ICD-10-CM

## 2014-11-18 DIAGNOSIS — K219 Gastro-esophageal reflux disease without esophagitis: Secondary | ICD-10-CM | POA: Diagnosis not present

## 2014-11-18 DIAGNOSIS — Y92018 Other place in single-family (private) house as the place of occurrence of the external cause: Secondary | ICD-10-CM

## 2014-11-18 DIAGNOSIS — Z9181 History of falling: Secondary | ICD-10-CM | POA: Diagnosis not present

## 2014-11-18 DIAGNOSIS — Z803 Family history of malignant neoplasm of breast: Secondary | ICD-10-CM | POA: Diagnosis not present

## 2014-11-18 LAB — RETICULOCYTES
RBC.: 3.12 MIL/uL — AB (ref 4.22–5.81)
Retic Count, Absolute: 84.2 10*3/uL (ref 19.0–186.0)
Retic Ct Pct: 2.7 % (ref 0.4–3.1)

## 2014-11-18 LAB — PROTIME-INR
INR: 0.99 (ref 0.00–1.49)
Prothrombin Time: 13.2 seconds (ref 11.6–15.2)

## 2014-11-18 LAB — CBC WITH DIFFERENTIAL/PLATELET
BASOS ABS: 0.1 10*3/uL (ref 0.0–0.1)
Basophils Relative: 1 % (ref 0–1)
EOS PCT: 2 % (ref 0–5)
Eosinophils Absolute: 0.1 10*3/uL (ref 0.0–0.7)
HCT: 25.5 % — ABNORMAL LOW (ref 39.0–52.0)
Hemoglobin: 8.3 g/dL — ABNORMAL LOW (ref 13.0–17.0)
Lymphocytes Relative: 20 % (ref 12–46)
Lymphs Abs: 0.7 10*3/uL (ref 0.7–4.0)
MCH: 26.5 pg (ref 26.0–34.0)
MCHC: 32.5 g/dL (ref 30.0–36.0)
MCV: 81.5 fL (ref 78.0–100.0)
MONO ABS: 0.2 10*3/uL (ref 0.1–1.0)
MONOS PCT: 7 % (ref 3–12)
Neutro Abs: 2.5 10*3/uL (ref 1.7–7.7)
Neutrophils Relative %: 70 % (ref 43–77)
PLATELETS: 186 10*3/uL (ref 150–400)
RBC: 3.13 MIL/uL — ABNORMAL LOW (ref 4.22–5.81)
RDW: 18 % — ABNORMAL HIGH (ref 11.5–15.5)
WBC: 3.5 10*3/uL — AB (ref 4.0–10.5)

## 2014-11-18 LAB — TYPE AND SCREEN
ABO/RH(D): O POS
Antibody Screen: NEGATIVE

## 2014-11-18 LAB — COMPREHENSIVE METABOLIC PANEL
ALK PHOS: 96 U/L (ref 38–126)
ALT: 40 U/L (ref 17–63)
ANION GAP: 15 (ref 5–15)
AST: 80 U/L — ABNORMAL HIGH (ref 15–41)
Albumin: 3.9 g/dL (ref 3.5–5.0)
BUN: 8 mg/dL (ref 6–20)
CALCIUM: 8.9 mg/dL (ref 8.9–10.3)
CHLORIDE: 102 mmol/L (ref 101–111)
CO2: 19 mmol/L — AB (ref 22–32)
Creatinine, Ser: 0.72 mg/dL (ref 0.61–1.24)
GFR calc Af Amer: >60 mL/min (ref >60)
GFR calc non Af Amer: >60 mL/min (ref >60)
Glucose, Bld: 90 mg/dL (ref 65–99)
Potassium: 4.3 mmol/L (ref 3.5–5.1)
SODIUM: 136 mmol/L (ref 135–145)
Total Bilirubin: 0.4 mg/dL (ref 0.3–1.2)
Total Protein: 7.5 g/dL (ref 6.5–8.1)

## 2014-11-18 LAB — IRON AND TIBC
IRON: 82 ug/dL (ref 45–182)
SATURATION RATIOS: 22 % (ref 17.9–39.5)
TIBC: 377 ug/dL (ref 250–450)
UIBC: 295 ug/dL

## 2014-11-18 LAB — FOLATE: Folate: 5.3 ng/mL — ABNORMAL LOW (ref >5.9)

## 2014-11-18 LAB — FERRITIN: Ferritin: 253 ng/mL (ref 24–336)

## 2014-11-18 LAB — VITAMIN B12: VITAMIN B 12: 689 pg/mL (ref 180–914)

## 2014-11-18 LAB — POC OCCULT BLOOD, ED: Fecal Occult Bld: NEGATIVE

## 2014-11-18 LAB — TROPONIN I: Troponin I: <0.03 ng/mL (ref <0.031)

## 2014-11-18 LAB — ETHANOL: ALCOHOL ETHYL (B): 295 mg/dL — AB (ref <5)

## 2014-11-18 LAB — ABO/RH: ABO/RH(D): O POS

## 2014-11-18 MED ORDER — ATORVASTATIN CALCIUM 80 MG PO TABS
80.0000 mg | ORAL_TABLET | Freq: Every day | ORAL | Status: DC
  Administered 2014-11-18 – 2014-11-23 (×6): 80 mg via ORAL
  Filled 2014-11-18 (×6): qty 1

## 2014-11-18 MED ORDER — FOLIC ACID 1 MG PO TABS
1.0000 mg | ORAL_TABLET | Freq: Every day | ORAL | Status: DC
  Administered 2014-11-19 – 2014-11-23 (×5): 1 mg via ORAL
  Filled 2014-11-18 (×5): qty 1

## 2014-11-18 MED ORDER — PROPRANOLOL HCL 10 MG PO TABS
10.0000 mg | ORAL_TABLET | Freq: Three times a day (TID) | ORAL | Status: DC
Start: 2014-11-18 — End: 2014-11-19
  Administered 2014-11-18: 10 mg via ORAL
  Filled 2014-11-18 (×3): qty 1

## 2014-11-18 MED ORDER — LORAZEPAM 2 MG/ML IJ SOLN: 1.0000 mg | Freq: Four times a day (QID) | INTRAMUSCULAR | Status: DC | PRN

## 2014-11-18 MED ORDER — ADULT MULTIVITAMIN W/MINERALS CH
1.0000 | ORAL_TABLET | Freq: Every day | ORAL | Status: DC
  Administered 2014-11-19 – 2014-11-20 (×2): 1 via ORAL
  Filled 2014-11-18 (×3): qty 1

## 2014-11-18 MED ORDER — THIAMINE HCL 100 MG/ML IJ SOLN
100.0000 mg | Freq: Every day | INTRAMUSCULAR | Status: DC
  Filled 2014-11-18 (×5): qty 1

## 2014-11-18 MED ORDER — ENOXAPARIN SODIUM 40 MG/0.4ML ~~LOC~~ SOLN
40.0000 mg | SUBCUTANEOUS | Status: DC
  Administered 2014-11-18 – 2014-11-22 (×5): 40 mg via SUBCUTANEOUS
  Filled 2014-11-18 (×5): qty 0.4

## 2014-11-18 MED ORDER — HYDROCODONE-ACETAMINOPHEN 5-325 MG PO TABS
1.0000 | ORAL_TABLET | Freq: Four times a day (QID) | ORAL | Status: DC | PRN
  Administered 2014-11-18 – 2014-11-21 (×5): 1 via ORAL
  Filled 2014-11-18 (×5): qty 1

## 2014-11-18 MED ORDER — ACETAMINOPHEN 325 MG PO TABS
650.0000 mg | ORAL_TABLET | Freq: Four times a day (QID) | ORAL | Status: DC | PRN
  Administered 2014-11-22: 650 mg via ORAL
  Filled 2014-11-18: qty 2

## 2014-11-18 MED ORDER — BUDESONIDE-FORMOTEROL FUMARATE 80-4.5 MCG/ACT IN AERO
2.0000 | INHALATION_SPRAY | Freq: Two times a day (BID) | RESPIRATORY_TRACT | Status: DC
  Administered 2014-11-18 – 2014-11-23 (×10): 2 via RESPIRATORY_TRACT
  Filled 2014-11-18: qty 6.9

## 2014-11-18 MED ORDER — ONDANSETRON HCL 4 MG PO TABS: 4.0000 mg | ORAL_TABLET | Freq: Four times a day (QID) | ORAL | Status: DC | PRN

## 2014-11-18 MED ORDER — LORAZEPAM 1 MG PO TABS: 1.0000 mg | ORAL_TABLET | Freq: Four times a day (QID) | ORAL | Status: DC | PRN

## 2014-11-18 MED ORDER — SODIUM CHLORIDE 0.9 % IV SOLN
1000.0000 mL | INTRAVENOUS | Status: DC
  Administered 2014-11-18 – 2014-11-19 (×2): 1000 mL via INTRAVENOUS

## 2014-11-18 MED ORDER — ACETAMINOPHEN 650 MG RE SUPP: 650.0000 mg | Freq: Four times a day (QID) | RECTAL | Status: DC | PRN

## 2014-11-18 MED ORDER — ONDANSETRON HCL 4 MG/2ML IJ SOLN: 4.0000 mg | Freq: Four times a day (QID) | INTRAMUSCULAR | Status: DC | PRN

## 2014-11-18 MED ORDER — VITAMIN B-1 100 MG PO TABS
100.0000 mg | ORAL_TABLET | Freq: Every day | ORAL | Status: DC
  Administered 2014-11-19 – 2014-11-23 (×5): 100 mg via ORAL
  Filled 2014-11-18 (×5): qty 1

## 2014-11-18 MED ORDER — THIAMINE HCL 100 MG/ML IJ SOLN
Freq: Once | INTRAVENOUS | Status: AC
  Administered 2014-11-18: 19:00:00 via INTRAVENOUS
  Filled 2014-11-18: qty 1000

## 2014-11-18 MED ORDER — LORAZEPAM 2 MG/ML IJ SOLN
0.0000 mg | Freq: Four times a day (QID) | INTRAMUSCULAR | Status: DC
  Administered 2014-11-18 – 2014-11-19 (×2): 2 mg via INTRAVENOUS
  Filled 2014-11-18 (×3): qty 1

## 2014-11-18 MED ORDER — THIAMINE HCL 100 MG/ML IJ SOLN
100.0000 mg | Freq: Once | INTRAMUSCULAR | Status: AC
  Administered 2014-11-18: 100 mg via INTRAVENOUS
  Filled 2014-11-18: qty 2

## 2014-11-18 MED ORDER — PANTOPRAZOLE SODIUM 40 MG PO TBEC
40.0000 mg | DELAYED_RELEASE_TABLET | Freq: Every day | ORAL | Status: DC
Start: 2014-11-18 — End: 2014-11-23
  Administered 2014-11-18 – 2014-11-23 (×6): 40 mg via ORAL
  Filled 2014-11-18 (×7): qty 1

## 2014-11-18 MED ORDER — LORAZEPAM 2 MG/ML IJ SOLN: 0.0000 mg | Freq: Two times a day (BID) | INTRAMUSCULAR | Status: DC

## 2014-11-18 MED ORDER — FENTANYL CITRATE (PF) 100 MCG/2ML IJ SOLN
50.0000 ug | INTRAMUSCULAR | Status: AC | PRN
  Administered 2014-11-18 – 2014-11-21 (×2): 50 ug via INTRAVENOUS
  Filled 2014-11-18 (×2): qty 2

## 2014-11-18 MED ORDER — BRIMONIDINE TARTRATE 0.2 % OP SOLN
1.0000 [drp] | Freq: Two times a day (BID) | OPHTHALMIC | Status: DC
  Administered 2014-11-18 – 2014-11-22 (×9): 1 [drp] via OPHTHALMIC
  Filled 2014-11-18: qty 5

## 2014-11-18 NOTE — Progress Notes (Signed)
Pt refused to sit or stand, Orthostatic vital signs not done.

## 2014-11-18 NOTE — ED Notes (Signed)
Nurse starting IV 

## 2014-11-18 NOTE — ED Notes (Signed)
Bed: GX27 Expected date:  Expected time:  Means of arrival:  Comments: EMS/fall/h/a

## 2014-11-18 NOTE — ED Notes (Signed)
CBG: 83,  Pt had fall today, then states syncope per EMS.  Pt has about 40 oz of beer today.  Pt report a fall 2 weeks ago and hurt Rt hip.

## 2014-11-18 NOTE — ED Notes (Signed)
Spoke with Mable Fill, Agricultural consultant

## 2014-11-18 NOTE — ED Notes (Signed)
No shortening or rotation in Rt leg.  Pt report black out 2x this morning while making a quick trip to the bathroom without his walker.  Pt only pain complaint is Rt hip.  He reports he jarred it when he fell.  States he uses a walker usually but just went to the bathroom without it.  Pt denies any other symptoms.  Pt did admit to ETOH today.  Wavers about how much he had.  States he has been passing out for over 2 years now.

## 2014-11-18 NOTE — ED Provider Notes (Signed)
CSN: [id number]     Arrival date & time 11/18/14  1241 History   First MD Initiated Contact with Patient 11/18/14 1348     Chief Complaint  Patient presents with  . Fall   HPI Patient presents to the emergency room for evaluation of right hip pain after an episode of weakness and a fall. The patient has history of chronic medical problems including cirrhosis and alcoholism. The patient drinks at least 40 ounces of beer daily. He was drinking alcohol this morning. The patient was walking to the bathroom without his walker when he became weak and fell. Patient states this happened twice and he had 2 episodes of syncope. Patient landed on his right hip and now is having pain and is unable to walk. Denies any headache. No abdominal pain. Note chest pain. No vomiting or diarrhea. Past Medical History  Diagnosis Date  . Hypertension   . Hyperlipemia   . Emphysema   . Stroke   . Chronic pain   . Cirrhosis of liver   . Fall   . Dental injury   . Bone spur of other site     Left Foot  . Impaired mobility   . Total self care deficit   . Pneumonia   . Emphysema   . ETOH abuse   . Elevated LFTs   . Arthritis   . Anxiety   . Sinusitis   . Anemia     iron def.  . Hard of hearing   . Diabetes mellitus without complication   . COPD (chronic obstructive pulmonary disease)   . Depression   . Glaucoma   . Emphysema of lung   . Cataract    Past Surgical History  Procedure Laterality Date  . Esophagogastroduodenoscopy  01/09/2012    Procedure: ESOPHAGOGASTRODUODENOSCOPY (EGD);  Surgeon: Beryle Beams, MD;  Location: [facility] ENDOSCOPY;  Service: Endoscopy;  Laterality: N/A;  . Multiple extractions with alveoloplasty  03/29/2012    Procedure: MULTIPLE EXTRACION WITH ALVEOLOPLASTY;  Surgeon: Gae Bon, DDS;  Location: [place];  Service: Oral Surgery;  Laterality: Bilateral;  . Bone spur  2013    left spur  . Cataract extraction w/ intraocular lens implant    . Mouth surgery    . Colonoscopy      Family History  Problem Relation Age of Onset  . Breast cancer Mother   . Diabetes Maternal Aunt   . Heart disease Sister   . Heart disease Maternal Aunt    History  Substance Use Topics  . Smoking status: Current Every Day Smoker -- 1.00 packs/day for 30 years    Types: Cigarettes  . Smokeless tobacco: Never Used  . Alcohol Use: 2.4 oz/week    4 Cans of beer per week     Comment: per pt  2 40oz daily beer. Last drink: 2 hrs ago    Review of Systems  All other systems reviewed and are negative.     Allergies  Aspirin and Poison ivy extract  Home Medications   Prior to Admission medications   Medication Sig Start Date End Date Taking? Authorizing Provider  acamprosate (CAMPRAL) 333 MG tablet Take 2 tablets (666 mg total) by mouth 3 (three) times daily with meals. 07/17/14  Yes Lorayne Marek, MD  albuterol (PROVENTIL HFA;VENTOLIN HFA) 108 (90 BASE) MCG/ACT inhaler Inhale 2 puffs into the lungs 4 (four) times daily. 07/17/14  Yes Lorayne Marek, MD  atorvastatin (LIPITOR) 80 MG tablet Take 1 tablet (80 mg total)  by mouth daily. 07/17/14  Yes Deepak Advani, MD  brimonidine (ALPHAGAN) 0.2 % ophthalmic solution Place 1 drop into both eyes 2 (two) times daily. 12/15/13  Yes Lorayne Marek, MD  budesonide-formoterol (SYMBICORT) 80-4.5 MCG/ACT inhaler Inhale 2 puffs into the lungs 2 (two) times daily. 07/17/14  Yes Lorayne Marek, MD  folic acid (FOLVITE) 1 MG tablet Take 1 tablet (1 mg total) by mouth daily. 07/17/14  Yes Lorayne Marek, MD  furosemide (LASIX) 40 MG tablet Take 1 tablet (40 mg total) by mouth daily. 07/17/14  Yes Lorayne Marek, MD  HYDROcodone-acetaminophen (NORCO/VICODIN) 5-325 MG per tablet Take 1 tablet by mouth every 6 (six) hours as needed for moderate pain. 05/31/14  Yes Theodis Blaze, MD  pantoprazole (PROTONIX) 40 MG tablet Take 1 tablet (40 mg total) by mouth daily. 07/17/14  Yes Lorayne Marek, MD  potassium chloride SA (K-DUR,KLOR-CON) 20 MEQ tablet Take 1 tablet (20  mEq total) by mouth daily. 07/17/14  Yes Lorayne Marek, MD  propranolol (INDERAL) 10 MG tablet Take 1 tablet (10 mg total) by mouth 3 (three) times daily. 07/17/14  Yes Lorayne Marek, MD  azithromycin (ZITHROMAX) 250 MG tablet Take 500mg  (2 tablets) today than one tablet daily until finished Patient not taking: Reported on 11/18/2014 07/18/14   Lorayne Marek, MD  clindamycin (CLEOCIN) 150 MG capsule Take 1 capsule (150 mg total) by mouth 4 (four) times daily. Patient not taking: Reported on 11/18/2014 05/31/14   Theodis Blaze, MD  clotrimazole (LOTRIMIN) 1 % cream Apply topically 2 (two) times daily. Patient not taking: Reported on 11/18/2014 12/15/13   Lorayne Marek, MD  ferrous sulfate 325 (65 FE) MG tablet Take 1 tablet (325 mg total) by mouth daily with breakfast. For low iron. Patient not taking: Reported on 11/18/2014 07/17/14   Lorayne Marek, MD  hydrOXYzine (ATARAX/VISTARIL) 25 MG tablet Take 1 tablet (25 mg total) by mouth 3 (three) times daily as needed for anxiety. Patient not taking: Reported on 11/18/2014 07/17/14   Lorayne Marek, MD  ipratropium-albuterol (DUONEB) 0.5-2.5 (3) MG/3ML SOLN Take 3 mLs by nebulization every 4 (four) hours as needed. Patient not taking: Reported on 11/18/2014 05/28/14   Theodis Blaze, MD  Multiple Vitamin (MULTIVITAMIN) tablet Take 1 tablet by mouth daily. Patient not taking: Reported on 11/18/2014 02/16/13   Tresa Garter, MD  nicotine (NICODERM CQ) 21 mg/24hr patch Place 1 patch (21 mg total) onto the skin daily. Patient not taking: Reported on 11/18/2014 12/15/13   Lorayne Marek, MD  thiamine (VITAMIN B-1) 100 MG tablet Take 1 tablet (100 mg total) by mouth daily. For vitamin B deficiency. Patient not taking: Reported on 11/18/2014 12/15/13   Lorayne Marek, MD  traZODone (DESYREL) 50 MG tablet Take 1 tablet (50 mg total) by mouth at bedtime. Patient not taking: Reported on 11/18/2014 07/17/14   Lorayne Marek, MD  vitamin B-12 (CYANOCOBALAMIN) 100 MCG tablet Take  1 tablet (100 mcg total) by mouth daily. For low B12. Patient not taking: Reported on 11/18/2014 12/15/13   Lorayne Marek, MD  vitamin E 200 UNIT capsule Take 1 capsule (200 Units total) by mouth daily. Patient not taking: Reported on 11/18/2014 02/16/13   Tresa Garter, MD   BP 117/71 mmHg  Pulse 78  Temp(Src) 97.6 F (36.4 C) (Oral)  Resp 18  SpO2 96% Physical Exam  Constitutional: No distress.  HENT:  Head: Normocephalic and atraumatic.  Right Ear: External ear normal.  Left Ear: External ear normal.  Eyes: Conjunctivae are  normal. Right eye exhibits no discharge. Left eye exhibits no discharge. No scleral icterus.  Neck: Neck supple. No tracheal deviation present.  Cardiovascular: Normal rate, regular rhythm and intact distal pulses.   Pulmonary/Chest: Effort normal and breath sounds normal. No stridor. No respiratory distress. He has no wheezes. He has no rales.  Abdominal: Soft. Bowel sounds are normal. He exhibits no distension. There is no tenderness. There is no rebound and no guarding.  Musculoskeletal: He exhibits tenderness. He exhibits no edema.       Right hip: He exhibits tenderness and bony tenderness.       Left hip: Normal.       Right ankle: Normal.       Cervical back: Normal.       Thoracic back: Normal.  Neurological: He is alert. He has normal strength. No cranial nerve deficit (no facial droop, extraocular movements intact, no slurred speech) or sensory deficit. He exhibits normal muscle tone. He displays no seizure activity. Coordination normal.  Skin: Skin is warm and dry. No rash noted. He is not diaphoretic.  Psychiatric: He has a normal mood and affect.  Nursing note and vitals reviewed.   ED Course  Procedures (including critical care time) Labs Review Labs Reviewed  CBC WITH DIFFERENTIAL/PLATELET - Abnormal; Notable for the following:    WBC 3.5 (*)    RBC 3.13 (*)    Hemoglobin 8.3 (*)    HCT 25.5 (*)    RDW 18.0 (*)    All other  components within normal limits  ETHANOL - Abnormal; Notable for the following:    Alcohol, Ethyl (B) 295 (*)    All other components within normal limits  COMPREHENSIVE METABOLIC PANEL - Abnormal; Notable for the following:    CO2 19 (*)    AST 80 (*)    All other components within normal limits  PROTIME-INR  POC OCCULT BLOOD, ED  TYPE AND SCREEN  ABO/RH    Imaging Review Ct Head Wo Contrast  11/18/2014   CLINICAL DATA:  64 year old male with syncope today.  EXAM: CT HEAD WITHOUT CONTRAST  TECHNIQUE: Contiguous axial images were obtained from the base of the skull through the vertex without intravenous contrast.  COMPARISON:  05/25/2014 and prior studies  FINDINGS: Generalized cerebral atrophy and chronic small-vessel white matter ischemic changes again noted.  No acute intracranial abnormalities are identified, including mass lesion or mass effect, hydrocephalus, extra-axial fluid collection, midline shift, hemorrhage, or acute infarction.  The visualized bony calvarium is unremarkable.  Scattered opacified ethmoid air cells and left frontal sinus noted. A small amount of fluid in the right maxillary sinus is present.  IMPRESSION: No evidence of acute intracranial abnormality.  Atrophy and chronic small-vessel white matter ischemic changes.  Acute on chronic sinus disease/ sinusitis.   Electronically Signed   By: Margarette Canada M.D.   On: 11/18/2014 15:27   Dg Hip Unilat With Pelvis 2-3 Views Right  11/18/2014   CLINICAL DATA:  Fall home today with right hip pain.  EXAM: RIGHT HIP (WITH PELVIS) 2-3 VIEWS  COMPARISON:  10/28/2013  FINDINGS: Examination demonstrates mild diffuse decreased bone mineralization. There are symmetric mild degenerative changes of the hips. There is a mildly displaced fracture focally involving the right greater trochanter. No definite evidence of extension of this fracture through the trochanteric region. Remainder the exam is unchanged.  IMPRESSION: Focal minimally  displaced fracture of the right greater trochanter. No definite intertrochanteric extension.   Electronically Signed   By:  Marin Olp M.D.   On: 11/18/2014 15:24     EKG Interpretation   Date/Time:  Saturday Nov 18 2014 14:31:58 EDT Ventricular Rate:  93 PR Interval:  170 QRS Duration: 88 QT Interval:  368 QTC Calculation: 458 R Axis:   78 Text Interpretation:  Sinus rhythm Baseline wander in lead(s) V5 No  significant change since last tracing Confirmed by Niki Cosman  MD-J, Senovia Gauer  ([id number]) on 11/18/2014 4:10:08 PM      MDM   Final diagnoses:  Pain  Anemia, unspecified anemia type  Greater trochanter fracture, right, closed, initial encounter  Syncope, unspecified syncope type  Alcohol intoxication, with unspecified complication    The xray shows a fracture of the greater trochanter.  NO intertrochanteric component.  Discussed with Dr Ninfa Linden.  Pt will not need surgery.  Pt can weight bear as tolerated.  Recurrent syncopal episode.  His intoxication is likely a significant contributing factor.  Will admit for cardicac monitoring, PT OT, further treatment.     Dorie Rank, MD 11/18/14 [phone]

## 2014-11-18 NOTE — H&P (Signed)
[facility] History and Physical  JEDIDIAH DEMARTINI SHF:[id number] DOB: 08/07/1950 DOA: 11/18/2014  Referring physician: EDP PCP: Lorayne Marek, MD   Chief Complaint: passed out two times today.   HPI: Peter [name] is a 64 y.o. male with h/o hypertension, hyperlipidemia, copd, h./o stroke in the past, ETOH abuse, takes about 40 beers a day, was brought in by family after he was found on the floor. As per the patient, he reports he was walking to the bathroom and fell on the floor, his brother heard the fall and came to find him. He denies feeling dizzy prior to the fall, . He denies any other complaints other than the right hip pain. Hip xrays revealed focal minimal displaced fracture of the right greater trochanter without any definite intertrochanteric extension. He was referred to medical service for admission for syncope work up.     Review of Systems:  Constitutional:  No weight loss, night sweats, Fevers, chills, fatigue.  HEENT:  No headaches, Difficulty swallowing,Tooth/dental problems,Sore throat,  No sneezing, itching, ear ache, nasal congestion, post nasal drip,  Cardio-vascular:  No chest pain, Orthopnea, PND, swelling in lower extremities, anasarca, dizziness, palpitations  GI:  No heartburn, indigestion, abdominal pain, nausea, vomiting, diarrhea, change in bowel habits, loss of appetite  Resp:  No shortness of breath with exertion or at rest. No excess mucus, no productive cough, No non-productive cough, No coughing up of blood.No change in color of mucus.No wheezing.No chest wall deformity  Skin:  no rash or lesions.  GU:  no dysuria, change in color of urine, no urgency or frequency. No flank pain.  Musculoskeletal:  No joint pain or swelling. No decreased range of motion. No back pain.  Psych:  No change in mood or affect. No depression or anxiety. No memory loss.   Past Medical History  Diagnosis Date  . Hypertension   . Hyperlipemia   . Emphysema   .  Stroke   . Chronic pain   . Cirrhosis of liver   . Fall   . Dental injury   . Bone spur of other site     Left Foot  . Impaired mobility   . Total self care deficit   . Pneumonia   . Emphysema   . ETOH abuse   . Elevated LFTs   . Arthritis   . Anxiety   . Sinusitis   . Anemia     iron def.  . Hard of hearing   . Diabetes mellitus without complication   . COPD (chronic obstructive pulmonary disease)   . Depression   . Glaucoma   . Emphysema of lung   . Cataract    Past Surgical History  Procedure Laterality Date  . Esophagogastroduodenoscopy  01/09/2012    Procedure: ESOPHAGOGASTRODUODENOSCOPY (EGD);  Surgeon: Beryle Beams, MD;  Location: [facility] ENDOSCOPY;  Service: Endoscopy;  Laterality: N/A;  . Multiple extractions with alveoloplasty  03/29/2012    Procedure: MULTIPLE EXTRACION WITH ALVEOLOPLASTY;  Surgeon: Gae Bon, DDS;  Location: [place];  Service: Oral Surgery;  Laterality: Bilateral;  . Bone spur  2013    left spur  . Cataract extraction w/ intraocular lens implant    . Mouth surgery    . Colonoscopy     Social History:  reports that he has been smoking Cigarettes.  He has a 30 pack-year smoking history. He has never used smokeless tobacco. He reports that he drinks about 2.4 oz of alcohol per week. He reports that  he does not use illicit drugs.  Allergies  Allergen Reactions  . Aspirin     Unknown- MD said do not take this medication  . Poison Ivy Extract [Extract Of Poison Ivy] Rash    Family History  Problem Relation Age of Onset  . Breast cancer Mother   . Diabetes Maternal Aunt   . Heart disease Sister   . Heart disease Maternal Aunt     Prior to Admission medications   Medication Sig Start Date End Date Taking? Authorizing Provider  acamprosate (CAMPRAL) 333 MG tablet Take 2 tablets (666 mg total) by mouth 3 (three) times daily with meals. 07/17/14  Yes Lorayne Marek, MD  albuterol (PROVENTIL HFA;VENTOLIN HFA) 108 (90 BASE) MCG/ACT inhaler Inhale 2  puffs into the lungs 4 (four) times daily. 07/17/14  Yes Lorayne Marek, MD  atorvastatin (LIPITOR) 80 MG tablet Take 1 tablet (80 mg total) by mouth daily. 07/17/14  Yes Deepak Advani, MD  brimonidine (ALPHAGAN) 0.2 % ophthalmic solution Place 1 drop into both eyes 2 (two) times daily. 12/15/13  Yes Lorayne Marek, MD  budesonide-formoterol (SYMBICORT) 80-4.5 MCG/ACT inhaler Inhale 2 puffs into the lungs 2 (two) times daily. 07/17/14  Yes Lorayne Marek, MD  folic acid (FOLVITE) 1 MG tablet Take 1 tablet (1 mg total) by mouth daily. 07/17/14  Yes Lorayne Marek, MD  furosemide (LASIX) 40 MG tablet Take 1 tablet (40 mg total) by mouth daily. 07/17/14  Yes Lorayne Marek, MD  HYDROcodone-acetaminophen (NORCO/VICODIN) 5-325 MG per tablet Take 1 tablet by mouth every 6 (six) hours as needed for moderate pain. 05/31/14  Yes Theodis Blaze, MD  pantoprazole (PROTONIX) 40 MG tablet Take 1 tablet (40 mg total) by mouth daily. 07/17/14  Yes Lorayne Marek, MD  potassium chloride SA (K-DUR,KLOR-CON) 20 MEQ tablet Take 1 tablet (20 mEq total) by mouth daily. 07/17/14  Yes Lorayne Marek, MD  propranolol (INDERAL) 10 MG tablet Take 1 tablet (10 mg total) by mouth 3 (three) times daily. 07/17/14  Yes Lorayne Marek, MD  azithromycin (ZITHROMAX) 250 MG tablet Take 500mg  (2 tablets) today than one tablet daily until finished Patient not taking: Reported on 11/18/2014 07/18/14   Lorayne Marek, MD  clindamycin (CLEOCIN) 150 MG capsule Take 1 capsule (150 mg total) by mouth 4 (four) times daily. Patient not taking: Reported on 11/18/2014 05/31/14   Theodis Blaze, MD  clotrimazole (LOTRIMIN) 1 % cream Apply topically 2 (two) times daily. Patient not taking: Reported on 11/18/2014 12/15/13   Lorayne Marek, MD  ferrous sulfate 325 (65 FE) MG tablet Take 1 tablet (325 mg total) by mouth daily with breakfast. For low iron. Patient not taking: Reported on 11/18/2014 07/17/14   Lorayne Marek, MD  hydrOXYzine (ATARAX/VISTARIL) 25 MG tablet Take 1  tablet (25 mg total) by mouth 3 (three) times daily as needed for anxiety. Patient not taking: Reported on 11/18/2014 07/17/14   Lorayne Marek, MD  ipratropium-albuterol (DUONEB) 0.5-2.5 (3) MG/3ML SOLN Take 3 mLs by nebulization every 4 (four) hours as needed. Patient not taking: Reported on 11/18/2014 05/28/14   Theodis Blaze, MD  Multiple Vitamin (MULTIVITAMIN) tablet Take 1 tablet by mouth daily. Patient not taking: Reported on 11/18/2014 02/16/13   Tresa Garter, MD  nicotine (NICODERM CQ) 21 mg/24hr patch Place 1 patch (21 mg total) onto the skin daily. Patient not taking: Reported on 11/18/2014 12/15/13   Lorayne Marek, MD  thiamine (VITAMIN B-1) 100 MG tablet Take 1 tablet (100 mg total) by mouth  daily. For vitamin B deficiency. Patient not taking: Reported on 11/18/2014 12/15/13   Lorayne Marek, MD  traZODone (DESYREL) 50 MG tablet Take 1 tablet (50 mg total) by mouth at bedtime. Patient not taking: Reported on 11/18/2014 07/17/14   Lorayne Marek, MD  vitamin B-12 (CYANOCOBALAMIN) 100 MCG tablet Take 1 tablet (100 mcg total) by mouth daily. For low B12. Patient not taking: Reported on 11/18/2014 12/15/13   Lorayne Marek, MD  vitamin E 200 UNIT capsule Take 1 capsule (200 Units total) by mouth daily. Patient not taking: Reported on 11/18/2014 02/16/13   Tresa Garter, MD   Physical Exam: Filed Vitals:   11/18/14 1247 11/18/14 1321 11/18/14 1601 11/18/14 1722  BP: 112/68 117/71 125/85 151/90  Pulse: 78  92 97  Temp:  97.6 F (36.4 C)  99.5 F (37.5 C)  TempSrc:  Oral  Oral  Resp:  18 18 16   SpO2: 97% 96% 99% 100%    Wt Readings from Last 3 Encounters:  07/17/14 67.586 kg (149 lb)  05/31/14 65 kg (143 lb 4.8 oz)  12/15/13 63.413 kg (139 lb 12.8 oz)    General:  Appears calm and comfortable Eyes: PERRL, normal lids, irises & conjunctiva Neck: no LAD, masses or thyromegaly Cardiovascular: RRR, no m/r/g. No LE edema. Telemetry: SR, no arrhythmias  Respiratory: CTA  bilaterally, no w/r/r. Normal respiratory effort. Abdomen: soft, ntnd Skin: no rash or induration seen on limited exam Musculoskeletal: grossly normal tone BUE/BLE Neurologic: grossly non-focal.          Labs on Admission:  Basic Metabolic Panel:  Recent Labs Lab 11/18/14 1505  NA 136  K 4.3  CL 102  CO2 19*  GLUCOSE 90  BUN 8  CREATININE 0.72  CALCIUM 8.9   Liver Function Tests:  Recent Labs Lab 11/18/14 1505  AST 80*  ALT 40  ALKPHOS 96  BILITOT 0.4  PROT 7.5  ALBUMIN 3.9   No results for input(s): LIPASE, AMYLASE in the last 168 hours. No results for input(s): AMMONIA in the last 168 hours. CBC:  Recent Labs Lab 11/18/14 1505  WBC 3.5*  NEUTROABS 2.5  HGB 8.3*  HCT 25.5*  MCV 81.5  PLT 186   Cardiac Enzymes: No results for input(s): CKTOTAL, CKMB, CKMBINDEX, TROPONINI in the last 168 hours.  BNP (last 3 results) No results for input(s): BNP in the last 8760 hours.  ProBNP (last 3 results)  Recent Labs  05/28/14 0500  PROBNP 6527.0*    CBG: No results for input(s): GLUCAP in the last 168 hours.  Radiological Exams on Admission: Ct Head Wo Contrast  11/18/2014   CLINICAL DATA:  64 year old male with syncope today.  EXAM: CT HEAD WITHOUT CONTRAST  TECHNIQUE: Contiguous axial images were obtained from the base of the skull through the vertex without intravenous contrast.  COMPARISON:  05/25/2014 and prior studies  FINDINGS: Generalized cerebral atrophy and chronic small-vessel white matter ischemic changes again noted.  No acute intracranial abnormalities are identified, including mass lesion or mass effect, hydrocephalus, extra-axial fluid collection, midline shift, hemorrhage, or acute infarction.  The visualized bony calvarium is unremarkable.  Scattered opacified ethmoid air cells and left frontal sinus noted. A small amount of fluid in the right maxillary sinus is present.  IMPRESSION: No evidence of acute intracranial abnormality.  Atrophy and  chronic small-vessel white matter ischemic changes.  Acute on chronic sinus disease/ sinusitis.   Electronically Signed   By: Margarette Canada M.D.   On: 11/18/2014 15:27  Dg Hip Unilat With Pelvis 2-3 Views Right  11/18/2014   CLINICAL DATA:  Fall home today with right hip pain.  EXAM: RIGHT HIP (WITH PELVIS) 2-3 VIEWS  COMPARISON:  10/28/2013  FINDINGS: Examination demonstrates mild diffuse decreased bone mineralization. There are symmetric mild degenerative changes of the hips. There is a mildly displaced fracture focally involving the right greater trochanter. No definite evidence of extension of this fracture through the trochanteric region. Remainder the exam is unchanged.  IMPRESSION: Focal minimally displaced fracture of the right greater trochanter. No definite intertrochanteric extension.   Electronically Signed   By: Marin Olp M.D.   On: 11/18/2014 15:24    EKG: reviewed showed sinus rhythm without any change from prior tracing.   Assessment/Plan Active Problems:   Syncope   Syncopal episode: Unclear etiology, probably related to alcohol intoxication vs cardiac etiology.  12 LEAD EKG, shows sinus rhythm, serial troponins ordered. Last echocardiogram revealed and EF OF 35 %.  Recommend getting a repeat echocardiogram for further evaluation.  Has a h/o stroke hence carotid massage not done.  Orthostatics ordered.    ETOH intoxication: Watch for withdrawls.  CIWA ON board.    Anemia; normocytic, chronic.    COPD: Not wheezing currently.    Mild non gap acidosis; possibly from alcohol intoxication.    Hypertension; slightly high.  Monitor and added prn hydralazine.    Right trochanteric fracture: -orthopedics consulted and recommendations given.  No surgery, weight bearing as tolerated, avoid, right hip abduction.  PT/ot EVAL Pain control.    Code Status: full code.  DVT Prophylaxis: Family Communication: family  at bedside Disposition Plan: admit to tele.    Time spent: 65 min  [facility] Pager [phone]

## 2014-11-18 NOTE — Progress Notes (Signed)
Patient ID: Peter [name], male   DOB: Nov 25, 1950, 64 y.o.   MRN: [id number] Dr. Tomi Bamberger had me take at look at [name] films regarding his right hip.  He does have an avulsion fracture of the tip of the greater trochanter.  This does not appear to extend from there and is usually considered a stable fracture.  He will be able to be up with assistance such as a walker with full weight bearing as tolerated on that right hip.  He should avoid excessive right hip abduction until further notice and will not want to lie down on his right side.  The walker is needed mainly due to his likely balance issues given his past alcohol use. I will check on him later this evening or tomorrow.  He will need PT and OT for mobility and ADL's.

## 2014-11-19 ENCOUNTER — Inpatient Hospital Stay (HOSPITAL_COMMUNITY): Payer: Medicare Other

## 2014-11-19 DIAGNOSIS — J439 Emphysema, unspecified: Secondary | ICD-10-CM

## 2014-11-19 DIAGNOSIS — R55 Syncope and collapse: Secondary | ICD-10-CM

## 2014-11-19 DIAGNOSIS — F1010 Alcohol abuse, uncomplicated: Secondary | ICD-10-CM

## 2014-11-19 LAB — BASIC METABOLIC PANEL
Anion gap: 12 (ref 5–15)
BUN: 8 mg/dL (ref 6–20)
CALCIUM: 8.8 mg/dL — AB (ref 8.9–10.3)
CHLORIDE: 103 mmol/L (ref 101–111)
CO2: 21 mmol/L — ABNORMAL LOW (ref 22–32)
Creatinine, Ser: 0.82 mg/dL (ref 0.61–1.24)
GFR calc Af Amer: >60 mL/min (ref >60)
GLUCOSE: 85 mg/dL (ref 65–99)
Potassium: 4.5 mmol/L (ref 3.5–5.1)
SODIUM: 136 mmol/L (ref 135–145)

## 2014-11-19 LAB — TROPONIN I: Troponin I: <0.03 ng/mL (ref <0.031)

## 2014-11-19 MED ORDER — LOPERAMIDE HCL 2 MG PO CAPS: 2.0000 mg | ORAL_CAPSULE | ORAL | Status: DC | PRN

## 2014-11-19 MED ORDER — ADULT MULTIVITAMIN W/MINERALS CH: 1.0000 | ORAL_TABLET | Freq: Every day | ORAL | Status: DC

## 2014-11-19 MED ORDER — SODIUM CHLORIDE 0.9 % IV SOLN
1000.0000 mL | INTRAVENOUS | Status: DC
  Administered 2014-11-19 – 2014-11-20 (×2): 1000 mL via INTRAVENOUS

## 2014-11-19 MED ORDER — VITAMIN B-1 100 MG PO TABS: 100.0000 mg | ORAL_TABLET | Freq: Every day | ORAL | Status: DC

## 2014-11-19 MED ORDER — HYDROXYZINE HCL 25 MG PO TABS
25.0000 mg | ORAL_TABLET | Freq: Four times a day (QID) | ORAL | Status: DC | PRN
  Administered 2014-11-20: 25 mg via ORAL
  Filled 2014-11-19: qty 1

## 2014-11-19 MED ORDER — THIAMINE HCL 100 MG/ML IJ SOLN: 100.0000 mg | Freq: Once | INTRAMUSCULAR | Status: DC

## 2014-11-19 MED ORDER — CARVEDILOL 3.125 MG PO TABS: 3.1250 mg | ORAL_TABLET | Freq: Two times a day (BID) | ORAL | Status: DC

## 2014-11-19 MED ORDER — CHLORDIAZEPOXIDE HCL 25 MG PO CAPS: 25.0000 mg | ORAL_CAPSULE | Freq: Every day | ORAL | Status: DC

## 2014-11-19 MED ORDER — CHLORDIAZEPOXIDE HCL 25 MG PO CAPS: 25.0000 mg | ORAL_CAPSULE | Freq: Four times a day (QID) | ORAL | Status: DC | PRN

## 2014-11-19 MED ORDER — CHLORDIAZEPOXIDE HCL 25 MG PO CAPS: 25.0000 mg | ORAL_CAPSULE | ORAL | Status: DC

## 2014-11-19 MED ORDER — CHLORDIAZEPOXIDE HCL 25 MG PO CAPS
25.0000 mg | ORAL_CAPSULE | Freq: Four times a day (QID) | ORAL | Status: AC
  Administered 2014-11-19 (×4): 25 mg via ORAL
  Filled 2014-11-19 (×4): qty 1

## 2014-11-19 MED ORDER — CHLORDIAZEPOXIDE HCL 25 MG PO CAPS
25.0000 mg | ORAL_CAPSULE | Freq: Three times a day (TID) | ORAL | Status: DC
  Administered 2014-11-20: 25 mg via ORAL
  Filled 2014-11-19: qty 1

## 2014-11-19 MED ORDER — ONDANSETRON 4 MG PO TBDP: 4.0000 mg | ORAL_TABLET | Freq: Four times a day (QID) | ORAL | Status: AC | PRN

## 2014-11-19 NOTE — Discharge Instructions (Signed)
You can put full weight on your right hip, but should only get around and walk using an assistive device such as a walker for balance.

## 2014-11-19 NOTE — Evaluation (Signed)
Physical Therapy Evaluation Patient Details Name: Peter [name] MRN: [id number] DOB: [birth year] Today's Date: 11/19/2014   History of Present Illness  Peter [name] is a 64 y.o. male with h/o hypertension, hyperlipidemia, copd, h./o stroke in the past, ETOH abuse, takes about 40 beers a day, was brought in by family after he was found on the floor. As per the patient, he reports he was walking to the bathroom and fell on the floor, his brother heard the fall and came to find him. He denies feeling dizzy prior to the fall, . He denies any other complaints other than the right hip pain. Hip xrays revealed focal minimal displaced fracture of the right greater trochanter without any definite intertrochanteric extension. For admission for syncope work up  [place] requesting orthostatics be obtained. Modified standing at the [facility] and obtained BP's.  Noted to drop to 81/51, [id number]. Patient will benefit from PT to address problems listed in note below.    Follow Up Recommendations SNF;Supervision - Intermittent    Equipment Recommendations  None recommended by PT    Recommendations for Other Services       Precautions / Restrictions Precautions Precautions: Fall Precaution Comments: orthostatic      Mobility  Bed Mobility Overal bed mobility: Needs Assistance;+2 for physical assistance;+ 2 for safety/equipment Bed Mobility: Supine to Sit;Sit to Supine     Supine to sit: HOB elevated;+2 for physical assistance;+2 for safety/equipment;Total assist Sit to supine: HOB elevated;+2 for safety/equipment;+2 for physical assistance;Total assist   General bed mobility comments: pt is very stiff/rigid in trunk, noted shaking of trunk and extremeties,   Transfers Overall transfer level: Needs assistance Equipment used: Rolling walker (2 wheeled) Transfers: Sit to/from Stand Sit to Stand: +2 physical assistance;+2 safety/equipment;From elevated surface         General  transfer comment: attempted at RW, unable to stand erect, placed STEDY, pt  able to  assist will pull to stand with bed used to assist. flaps placed down.orthostatics staken in this position. assisted to stand again   and return to sitting on bed edge.   Ambulation/Gait                Stairs            Wheelchair Mobility    Modified Rankin (Stroke Patients Only)       Balance Overall balance assessment: Needs assistance;History of Falls Sitting-balance support: Bilateral upper extremity supported;Feet supported Sitting balance-Leahy Scale: Zero     Standing balance support: During functional activity;Bilateral upper extremity supported Standing balance-Leahy Scale: Zero                               Pertinent Vitals/Pain Pain Assessment: Faces Faces Pain Scale: Hurts even more Pain Location: R hip Pain Descriptors / Indicators: Discomfort Pain Intervention(s): Limited activity within patient's tolerance;Monitored during session;Repositioned;Ice applied    Home Living Family/patient expects to be discharged to:: Private residence   Available Help at Discharge: Available PRN/intermittently Type of Home: House Home Access: Stairs to enter Entrance Stairs-Rails: None Entrance Stairs-Number of Steps: 4 Home Layout: One level Home Equipment: Environmental consultant - 2 wheels;Cane - single point;Tub bench      Prior Function Level of Independence: Independent with assistive device(s)               Hand Dominance        Extremity/Trunk Assessment   Upper Extremity Assessment:  RUE deficits/detail;LUE deficits/detail RUE Deficits / Details: decreased control to reach  to face, trmors are present     LUE Deficits / Details: same as R   Lower Extremity Assessment: RLE deficits/detail;LLE deficits/detail RLE Deficits / Details: very stiff to attempt to flex hip, did flex hip  sitting at edge LLE Deficits / Details: very stiiff  like R but moves through  ROM without assist  Cervical / Trunk Assessment: Other exceptions  Communication   Communication:  (speech os slurred and slow)  Cognition Arousal/Alertness: Awake/alert Behavior During Therapy: Flat affect   Area of Impairment: Orientation;Attention;Following commands Orientation Level: Time Current Attention Level: Sustained   Following Commands: Follows one step commands with increased time            General Comments      Exercises        Assessment/Plan    PT Assessment Patient needs continued PT services  PT Diagnosis Difficulty walking;Generalized weakness;Acute pain;Altered mental status   PT Problem List Decreased strength;Decreased range of motion;Decreased activity tolerance;Decreased balance;Decreased mobility;Decreased cognition;Cardiopulmonary status limiting activity;Decreased knowledge of precautions;Decreased safety awareness;Decreased knowledge of use of DME;Pain  PT Treatment Interventions DME instruction;Gait training;Functional mobility training;Therapeutic activities;Therapeutic exercise;Balance training;Patient/family education   PT Goals (Current goals can be found in the Care Plan section) Acute Rehab PT Goals Patient Stated Goal: not stated PT Goal Formulation: Patient unable to participate in goal setting Time For Goal Achievement: 12/03/14 Potential to Achieve Goals: Fair    Frequency Min 3X/week   Barriers to discharge Decreased caregiver support      Co-evaluation               End of Session Equipment Utilized During Treatment: Gait belt Activity Tolerance: Patient limited by fatigue;Treatment limited secondary to medical complications (Comment) (noted to be orthostatic) Patient left: with call bell/phone within reach;with bed alarm set Nurse Communication: Mobility status (BP)         Time: 1459-1535 PT Time Calculation (min) (ACUTE ONLY): 36 min   Charges:   PT Evaluation $Initial PT Evaluation Tier I: 1  Procedure PT Treatments $Therapeutic Activity: 8-22 mins   PT G Codes:        Claretha Cooper 11/19/2014, 5:16 PM Tresa Endo PT [phone]

## 2014-11-19 NOTE — Progress Notes (Addendum)
[facility] PROGRESS NOTE Interim History: 64 y.o. male with h/o hypertension, hyperlipidemia, copd, h./o stroke in the past, ETOH abuse, takes about 40 beers a day, was brought in by family after he was found on the floor.  Assessment/Plan: Syncope - Cardiac markers negative. ECK SR normal axis no t wave inversions. - Repeated echo pending. - B-met pending. Check orthostatics. - Start coreg, will get an appointment with cardiology as an outpatient.  Alcohol abuse - Started on thiamin and folate, monitor with CIWA. - Use Librium.  History of stroke -  Cont ASA.   Minimally displace Right hip trochanteric fracture: - Ortho consulted rec that this considered a stable fracture. He will be able to be up with assistance such as a walker with full weight bearing as tolerated on that right hip. He should avoid excessive right hip abduction until further notice and will not want to lie down on his right side. The walker is needed mainly due to his likely balance issues .  Chronic systolic heart failure: - Started coreg low dose. - Hold diuretics. Check orthostatics.  Code Status: full code.  DVT Prophylaxis: Family Communication: family at bedside Disposition Plan: home in am.    Consultants:  none  Procedures:  echo  Antibiotics:  None  HPI/Subjective: No complains wants to go home.  Objective: Filed Vitals:   11/18/14 1722 11/18/14 1745 11/18/14 2136 11/19/14 0501  BP: 151/90  109/67 145/91  Pulse: 97  100 122  Temp: 99.5 F (37.5 C)  99.3 F (37.4 C) 100.1 F (37.8 C)  TempSrc: Oral  Oral Oral  Resp: _0 Height:  _1  (1.727 m)    Weight:  62.823 kg (138 lb 8 oz)    SpO2: 100%  95% 98%    Intake/Output Summary (Last 24 hours) at 11/19/14 0731 Last data filed at 11/19/14 0600  Gross per 24 hour  Intake   1450 ml  Output    600 ml  Net    850 ml   Filed Weights   11/18/14 1745  Weight: 62.823 kg (138 lb 8 oz)     Exam:  General: Alert, awake, oriented x3, in no acute distress. trmors HEENT: No bruits, no goiter.  Heart: Regular rate and rhythm. Lungs: Good air movement, clear Abdomen: Soft, nontender, nondistended, positive bowel sounds.  Neuro: Grossly intact, nonfocal.   Data Reviewed: Basic Metabolic Panel:  Recent Labs Lab 11/18/14 1505  NA 136  K 4.3  CL 102  CO2 19*  GLUCOSE 90  BUN 8  CREATININE 0.72  CALCIUM 8.9   Liver Function Tests:  Recent Labs Lab 11/18/14 1505  AST 80*  ALT 40  ALKPHOS 96  BILITOT 0.4  PROT 7.5  ALBUMIN 3.9   No results for input(s): LIPASE, AMYLASE in the last 168 hours. No results for input(s): AMMONIA in the last 168 hours. CBC:  Recent Labs Lab 11/18/14 1505  WBC 3.5*  NEUTROABS 2.5  HGB 8.3*  HCT 25.5*  MCV 81.5  PLT 186   Cardiac Enzymes:  Recent Labs Lab 11/18/14 1757 11/18/14 2325 11/19/14 0530  TROPONINI <0.03 <0.03 <0.03   BNP (last 3 results) No results for input(s): BNP in the last 8760 hours.  ProBNP (last 3 results)  Recent Labs  05/28/14 0500  PROBNP 6527.0*    CBG: No results for input(s): GLUCAP in the last 168 hours.  No results found for this or any previous visit (from the past 240  hour(s)).   Studies: Ct Head Wo Contrast  11/18/2014   CLINICAL DATA:  64 year old male with syncope today.  EXAM: CT HEAD WITHOUT CONTRAST  TECHNIQUE: Contiguous axial images were obtained from the base of the skull through the vertex without intravenous contrast.  COMPARISON:  05/25/2014 and prior studies  FINDINGS: Generalized cerebral atrophy and chronic small-vessel white matter ischemic changes again noted.  No acute intracranial abnormalities are identified, including mass lesion or mass effect, hydrocephalus, extra-axial fluid collection, midline shift, hemorrhage, or acute infarction.  The visualized bony calvarium is unremarkable.  Scattered opacified ethmoid air cells and left frontal sinus noted. A  small amount of fluid in the right maxillary sinus is present.  IMPRESSION: No evidence of acute intracranial abnormality.  Atrophy and chronic small-vessel white matter ischemic changes.  Acute on chronic sinus disease/ sinusitis.   Electronically Signed   By: Margarette Canada M.D.   On: 11/18/2014 15:27   Dg Hip Unilat With Pelvis 2-3 Views Right  11/18/2014   CLINICAL DATA:  Fall home today with right hip pain.  EXAM: RIGHT HIP (WITH PELVIS) 2-3 VIEWS  COMPARISON:  10/28/2013  FINDINGS: Examination demonstrates mild diffuse decreased bone mineralization. There are symmetric mild degenerative changes of the hips. There is a mildly displaced fracture focally involving the right greater trochanter. No definite evidence of extension of this fracture through the trochanteric region. Remainder the exam is unchanged.  IMPRESSION: Focal minimally displaced fracture of the right greater trochanter. No definite intertrochanteric extension.   Electronically Signed   By: Marin Olp M.D.   On: 11/18/2014 15:24    Scheduled Meds: . atorvastatin  80 mg Oral Daily  . brimonidine  1 drop Both Eyes BID  . budesonide-formoterol  2 puff Inhalation BID  . enoxaparin (LOVENOX) injection  40 mg Subcutaneous Q24H  . folic acid  1 mg Oral Daily  . LORazepam  0-4 mg Intravenous Q6H   Followed by  . [START ON 11/20/2014] LORazepam  0-4 mg Intravenous Q12H  . multivitamin with minerals  1 tablet Oral Daily  . pantoprazole  40 mg Oral Daily  . propranolol  10 mg Oral TID  . thiamine  100 mg Oral Daily   Or  . thiamine  100 mg Intravenous Daily   Continuous Infusions: . sodium chloride 1,000 mL (11/19/14 0324)    Time Spent: 25 min   Charlynne Cousins  [facility] Pager [phone]. If 7PM-7AM, please contact night-coverage at [email or web address], password [facility] 11/19/2014, 7:31 AM  LOS: 1 day

## 2014-11-19 NOTE — Consult Note (Signed)
Reason for Consult:  Right hip greater trochanter avulsion fracture Referring Physician:   EDP  Peter [name] is an 64 y.o. male.  HPI:   64 yo male with balance issues likely secondary to alcohol use.  Sustained a mechanical fall yesterday and was seen at the [facility] ED.  From an ortho standpoint, he was found to have a right hip greater trochanter avulsion fracture.  He was admitted due to his syncope and anemia.  Past Medical History  Diagnosis Date  . Hypertension   . Hyperlipemia   . Emphysema   . Stroke   . Chronic pain   . Cirrhosis of liver   . Fall   . Dental injury   . Bone spur of other site     Left Foot  . Impaired mobility   . Total self care deficit   . Pneumonia   . Emphysema   . ETOH abuse   . Elevated LFTs   . Arthritis   . Anxiety   . Sinusitis   . Anemia     iron def.  . Hard of hearing   . Diabetes mellitus without complication   . COPD (chronic obstructive pulmonary disease)   . Depression   . Glaucoma   . Emphysema of lung   . Cataract     Past Surgical History  Procedure Laterality Date  . Esophagogastroduodenoscopy  01/09/2012    Procedure: ESOPHAGOGASTRODUODENOSCOPY (EGD);  Surgeon: Beryle Beams, MD;  Location: [facility] ENDOSCOPY;  Service: Endoscopy;  Laterality: N/A;  . Multiple extractions with alveoloplasty  03/29/2012    Procedure: MULTIPLE EXTRACION WITH ALVEOLOPLASTY;  Surgeon: Gae Bon, DDS;  Location: [facility];  Service: Oral Surgery;  Laterality: Bilateral;  . Bone spur  2013    left spur  . Cataract extraction w/ intraocular lens implant    . Mouth surgery    . Colonoscopy      Family History  Problem Relation Age of Onset  . Breast cancer Mother   . Diabetes Maternal Aunt   . Heart disease Sister   . Heart disease Maternal Aunt     Social History:  reports that he has been smoking Cigarettes.  He has a 30 pack-year smoking history. He has never used smokeless tobacco. He reports that he drinks about 2.4 oz of alcohol  per week. He reports that he does not use illicit drugs.  Allergies:  Allergies  Allergen Reactions  . Aspirin     Unknown- MD said do not take this medication  . Poison Ivy Extract [Extract Of Poison Ivy] Rash    Medications: I have reviewed the patient's current medications.  Results for orders placed or performed during the hospital encounter of 11/18/14 (from the past 48 hour(s))  CBC WITH DIFFERENTIAL     Status: Abnormal   Collection Time: 11/18/14  3:05 PM  Result Value Ref Range   WBC 3.5 (L) 4.0 - 10.5 K/uL   RBC 3.13 (L) 4.22 - 5.81 MIL/uL   Hemoglobin 8.3 (L) 13.0 - 17.0 g/dL   HCT 25.5 (L) 39.0 - 52.0 %   MCV 81.5 78.0 - 100.0 fL   MCH 26.5 26.0 - 34.0 pg   MCHC 32.5 30.0 - 36.0 g/dL   RDW 18.0 (H) 11.5 - 15.5 %   Platelets 186 150 - 400 K/uL   Neutrophils Relative % 70 43 - 77 %   Neutro Abs 2.5 1.7 - 7.7 K/uL   Lymphocytes Relative 20 12 -  46 %   Lymphs Abs 0.7 0.7 - 4.0 K/uL   Monocytes Relative 7 3 - 12 %   Monocytes Absolute 0.2 0.1 - 1.0 K/uL   Eosinophils Relative 2 0 - 5 %   Eosinophils Absolute 0.1 0.0 - 0.7 K/uL   Basophils Relative 1 0 - 1 %   Basophils Absolute 0.1 0.0 - 0.1 K/uL  Protime-INR     Status: None   Collection Time: 11/18/14  3:05 PM  Result Value Ref Range   Prothrombin Time 13.2 11.6 - 15.2 seconds   INR 0.99 0.00 - 1.49  Type and screen     Status: None   Collection Time: 11/18/14  3:05 PM  Result Value Ref Range   ABO/RH(D) O POS    Antibody Screen NEG    Sample Expiration 11/21/2014   Ethanol     Status: Abnormal   Collection Time: 11/18/14  3:05 PM  Result Value Ref Range   Alcohol, Ethyl (B) 295 (H) <5 mg/dL    Comment:        LOWEST DETECTABLE LIMIT FOR SERUM ALCOHOL IS 11 mg/dL FOR MEDICAL PURPOSES ONLY   Comprehensive metabolic panel     Status: Abnormal   Collection Time: 11/18/14  3:05 PM  Result Value Ref Range   Sodium 136 135 - 145 mmol/L   Potassium 4.3 3.5 - 5.1 mmol/L   Chloride 102 101 - 111 mmol/L    CO2 19 (L) 22 - 32 mmol/L   Glucose, Bld 90 65 - 99 mg/dL   BUN 8 6 - 20 mg/dL   Creatinine, Ser 0.72 0.61 - 1.24 mg/dL   Calcium 8.9 8.9 - 10.3 mg/dL   Total Protein 7.5 6.5 - 8.1 g/dL   Albumin 3.9 3.5 - 5.0 g/dL   AST 80 (H) 15 - 41 U/L   ALT 40 17 - 63 U/L   Alkaline Phosphatase 96 38 - 126 U/L   Total Bilirubin 0.4 0.3 - 1.2 mg/dL   GFR calc non Af Amer >60 >60 mL/min   GFR calc Af Amer >60 >60 mL/min    Comment: (NOTE) The eGFR has been calculated using the CKD EPI equation. This calculation has not been validated in all clinical situations. eGFR's persistently <60 mL/min signify possible Chronic Kidney Disease.    Anion gap 15 5 - 15  ABO/Rh     Status: None   Collection Time: 11/18/14  3:05 PM  Result Value Ref Range   ABO/RH(D) O POS   POC occult blood, ED RN will collect     Status: None   Collection Time: 11/18/14  4:42 PM  Result Value Ref Range   Fecal Occult Bld NEGATIVE NEGATIVE  Vitamin B12     Status: None   Collection Time: 11/18/14  5:57 PM  Result Value Ref Range   Vitamin B-12 689 180 - 914 pg/mL    Comment: (NOTE) This assay is not validated for testing neonatal or myeloproliferative syndrome specimens for Vitamin B12 levels. Performed at [facility]   Folate     Status: Abnormal   Collection Time: 11/18/14  5:57 PM  Result Value Ref Range   Folate 5.3 (L) >5.9 ng/mL    Comment: Performed at [facility]  Iron and TIBC     Status: None   Collection Time: 11/18/14  5:57 PM  Result Value Ref Range   Iron 82 45 - 182 ug/dL   TIBC 377 250 - 450 ug/dL  Saturation Ratios 22 17.9 - 39.5 %   UIBC 295 ug/dL    Comment: Performed at [facility]  Ferritin     Status: None   Collection Time: 11/18/14  5:57 PM  Result Value Ref Range   Ferritin 253 24 - 336 ng/mL    Comment: Performed at [facility]  Reticulocytes     Status: Abnormal   Collection Time: 11/18/14  5:57 PM  Result Value Ref Range   Retic Ct Pct 2.7 0.4 -  3.1 %   RBC. 3.12 (L) 4.22 - 5.81 MIL/uL   Retic Count, Manual 84.2 19.0 - 186.0 K/uL  Troponin I (q 6hr x 3)     Status: None   Collection Time: 11/18/14  5:57 PM  Result Value Ref Range   Troponin I <0.03 <0.031 ng/mL    Comment:        NO INDICATION OF MYOCARDIAL INJURY.   Troponin I (q 6hr x 3)     Status: None   Collection Time: 11/18/14 11:25 PM  Result Value Ref Range   Troponin I <0.03 <0.031 ng/mL    Comment:        NO INDICATION OF MYOCARDIAL INJURY.   Troponin I (q 6hr x 3)     Status: None   Collection Time: 11/19/14  5:30 AM  Result Value Ref Range   Troponin I <0.03 <0.031 ng/mL    Comment:        NO INDICATION OF MYOCARDIAL INJURY.     Ct Head Wo Contrast  11/18/2014   CLINICAL DATA:  64 year old male with syncope today.  EXAM: CT HEAD WITHOUT CONTRAST  TECHNIQUE: Contiguous axial images were obtained from the base of the skull through the vertex without intravenous contrast.  COMPARISON:  05/25/2014 and prior studies  FINDINGS: Generalized cerebral atrophy and chronic small-vessel white matter ischemic changes again noted.  No acute intracranial abnormalities are identified, including mass lesion or mass effect, hydrocephalus, extra-axial fluid collection, midline shift, hemorrhage, or acute infarction.  The visualized bony calvarium is unremarkable.  Scattered opacified ethmoid air cells and left frontal sinus noted. A small amount of fluid in the right maxillary sinus is present.  IMPRESSION: No evidence of acute intracranial abnormality.  Atrophy and chronic small-vessel white matter ischemic changes.  Acute on chronic sinus disease/ sinusitis.   Electronically Signed   By: Margarette Canada M.D.   On: 11/18/2014 15:27   Dg Hip Unilat With Pelvis 2-3 Views Right  11/18/2014   CLINICAL DATA:  Fall home today with right hip pain.  EXAM: RIGHT HIP (WITH PELVIS) 2-3 VIEWS  COMPARISON:  10/28/2013  FINDINGS: Examination demonstrates mild diffuse decreased bone mineralization.  There are symmetric mild degenerative changes of the hips. There is a mildly displaced fracture focally involving the right greater trochanter. No definite evidence of extension of this fracture through the trochanteric region. Remainder the exam is unchanged.  IMPRESSION: Focal minimally displaced fracture of the right greater trochanter. No definite intertrochanteric extension.   Electronically Signed   By: Marin Olp M.D.   On: 11/18/2014 15:24    ROS Blood pressure 145/91, pulse 122, temperature 100.1 F (37.8 C), temperature source Oral, resp. rate 17, height _0  (1.727 m), weight 62.823 kg (138 lb 8 oz), SpO2 98 %. Physical Exam  Musculoskeletal:       Right hip: He exhibits bony tenderness.  He has pain to palpation over his right greater trochanter.  I can move his hip  around pretty easily. His leg lengths are equal.  Assessment/Plan: Right hip greater trochanter avulsion fracture 1)  This is a fracture that can be treated non-operatively with full weight bearing as tolerated.  He can be up with therapy and really does need to use a walker due to balance issues.  I spoke to him about this in length.  I will need to see him in the office in 1-2 weeks for repeat x-rays.  July Linam Y 11/19/2014, 7:59 AM

## 2014-11-19 NOTE — Progress Notes (Signed)
  Echocardiogram 2D Echocardiogram has been performed.  Lysle Rubens 11/19/2014, 10:49 AM

## 2014-11-20 DIAGNOSIS — F10230 Alcohol dependence with withdrawal, uncomplicated: Secondary | ICD-10-CM

## 2014-11-20 LAB — BASIC METABOLIC PANEL
Anion gap: 15 (ref 5–15)
BUN: 6 mg/dL (ref 6–20)
CALCIUM: 9.7 mg/dL (ref 8.9–10.3)
CO2: 22 mmol/L (ref 22–32)
Chloride: 100 mmol/L — ABNORMAL LOW (ref 101–111)
Creatinine, Ser: 0.78 mg/dL (ref 0.61–1.24)
GFR calc Af Amer: >60 mL/min (ref >60)
GFR calc non Af Amer: >60 mL/min (ref >60)
GLUCOSE: 81 mg/dL (ref 65–99)
Potassium: 3.7 mmol/L (ref 3.5–5.1)
Sodium: 137 mmol/L (ref 135–145)

## 2014-11-20 MED ORDER — NICOTINE 21 MG/24HR TD PT24
21.0000 mg | MEDICATED_PATCH | Freq: Every day | TRANSDERMAL | Status: DC
  Administered 2014-11-20 – 2014-11-23 (×4): 21 mg via TRANSDERMAL
  Filled 2014-11-20 (×4): qty 1

## 2014-11-20 MED ORDER — LORAZEPAM 2 MG/ML IJ SOLN
INTRAMUSCULAR | Status: AC
  Filled 2014-11-20: qty 1

## 2014-11-20 MED ORDER — CHLORDIAZEPOXIDE HCL 25 MG PO CAPS: 25.0000 mg | ORAL_CAPSULE | Freq: Four times a day (QID) | ORAL | Status: DC

## 2014-11-20 MED ORDER — LISINOPRIL 5 MG PO TABS
5.0000 mg | ORAL_TABLET | Freq: Every day | ORAL | Status: DC
  Administered 2014-11-20 – 2014-11-23 (×4): 5 mg via ORAL
  Filled 2014-11-20 (×4): qty 1

## 2014-11-20 MED ORDER — CARVEDILOL 6.25 MG PO TABS
6.2500 mg | ORAL_TABLET | Freq: Two times a day (BID) | ORAL | Status: DC
  Administered 2014-11-20 – 2014-11-23 (×7): 6.25 mg via ORAL
  Filled 2014-11-20 (×7): qty 1

## 2014-11-20 MED ORDER — LORAZEPAM 2 MG/ML IJ SOLN
0.5000 mg | Freq: Once | INTRAMUSCULAR | Status: AC
  Administered 2014-11-20: 0.5 mg via INTRAVENOUS

## 2014-11-20 MED ORDER — CHLORDIAZEPOXIDE HCL 25 MG PO CAPS
25.0000 mg | ORAL_CAPSULE | Freq: Four times a day (QID) | ORAL | Status: AC
  Administered 2014-11-20 – 2014-11-21 (×4): 25 mg via ORAL
  Filled 2014-11-20 (×4): qty 1

## 2014-11-20 NOTE — Clinical Social Work Note (Signed)
Clinical Social Work Assessment  Patient Details  Name: Peter [name] MRN: [id number] Date of Birth: April 16, 1951  Date of referral:  11/20/14               Reason for consult:  Facility Placement, Substance Use/ETOH Abuse                Permission sought to share information with:  Chartered certified accountant granted to share information::  No  Name::        Agency::     Relationship::     Contact Information:     Housing/Transportation Living arrangements for the past 2 months:  Single Family Home Source of Information:  Patient Patient Interpreter Needed:  None Criminal Activity/Legal Involvement Pertinent to Current Situation/Hospitalization:    Significant Relationships:  Parents, Siblings, [place] Lives with:  Siblings Do you feel safe going back to the place where you live?  Yes Need for family participation in patient care:  No (Coment)  Care giving concerns:  CSW reviewed PT evaluation recommending SNF at discharge.    Social Worker assessment / plan:  CSW met with patient at bedside to discuss discharge plans - patient informed CSW that he plans to return home with his brother, Peter [name] at discharge and does not want to go to SNF. RNCM, Kathy aware.   Employment status:  Disabled (Comment on whether or not currently receiving Disability) Insurance information:  Managed Medicare PT Recommendations:  [place] / Referral to community resources:  Outpatient Substance Abuse Treatment Options  Patient/Family's Response to care:  Patient was very guarded when talking about alcohol abuse and functional limitations at home. Patient informed CSW that he only drinks 1 - 40oz a day of beer, though his blood alcohol level upon admission was 295.   Patient/Family's Understanding of and Emotional Response to Diagnosis, Current Treatment, and Prognosis:  Patient stated that he understands that it will take awhile for him to get his  strength back but does not want to go to SNF. CSW also provided patient with AA meeting list & substance abuse resources, but patient informed CSW that he does not drive and would not be able to go to those places, though he plans to quit drinking with help from his church and family.   Emotional Assessment Appearance:  Appears older than stated age Attitude/Demeanor/Rapport:    Affect (typically observed):  Calm, Guarded Orientation:  Oriented to Self, Oriented to Place, Oriented to  Time, Oriented to Situation Alcohol / Substance use:  Alcohol Use Psych involvement (Current and /or in the community):  No (Comment)  Discharge Needs  Concerns to be addressed:  Substance Abuse Concerns, Discharge Planning Concerns Readmission within the last 30 days:    Current discharge risk:  Substance Abuse Barriers to Discharge:      Standley Brooking, LCSW 11/20/2014, 10:32 AM

## 2014-11-20 NOTE — Clinical Social Work Placement (Signed)
Per RNCM, [name] - patient is now agreeable with plan for SNF. CSW faxed information out to [facility] & will follow-up with bed offers when available.    Raynaldo Opitz, [facility] Clinical Social Worker cell #: [phone]    CLINICAL SOCIAL WORK PLACEMENT  NOTE  Date:  11/20/2014  Patient Details  Name: Peter [name] MRN: [id number] Date of Birth: [birth year]  Clinical Social Work is seeking post-discharge placement for this patient at the [place] level of care (*CSW will initial, date and re-position this form in  chart as items are completed):  Yes   Patient/family provided with [facility] Work Department's list of facilities offering this level of care within the geographic area requested by the patient (or if unable, by the patient's family).  Yes   Patient/family informed of their freedom to choose among providers that offer the needed level of care, that participate in Medicare, Medicaid or managed care program needed by the patient, have an available bed and are willing to accept the patient.  Yes   Patient/family informed of [facility]'s ownership interest in [facility] and [facility], as well as of the fact that they are under no obligation to receive care at these facilities.  PASRR submitted to EDS on       PASRR number received on       Existing PASRR number confirmed on 11/20/14     FL2 transmitted to all facilities in geographic area requested by pt/family on 11/20/14     FL2 transmitted to all facilities within larger geographic area on       Patient informed that his/her managed care company has contracts with or will negotiate with certain facilities, including the following:            Patient/family informed of bed offers received.  Patient chooses bed at       Physician recommends and patient chooses bed at      Patient to be transferred to   on  .  Patient to be  transferred to facility by       Patient family notified on   of transfer.  Name of family member notified:        PHYSICIAN       Additional Comment:    _______________________________________________ Standley Brooking, LCSW 11/20/2014, 2:47 PM

## 2014-11-20 NOTE — Care Management Note (Signed)
Case Management Note  Patient Details  Name: Peter [name] MRN: [id number] Date of Birth: 1950/09/16  Subjective/Objective:                    Action/Plan:Referred to CSW for SNF search.   Expected Discharge Date:                  Expected Discharge Plan:  [place] (Patient now agrees to SNF.CSW notified.)  In-House Referral:  Clinical Social Work  Discharge planning Services  CM Consult  Post Acute Care Choice:    Choice offered to:     DME Arranged:    DME Agency:     HH Arranged:    HH Agency:     Status of Service:  In process, will continue to follow  Medicare Important Message Given:    Date Medicare IM Given:    Medicare IM give by:    Date Additional Medicare IM Given:    Additional Medicare Important Message give by:     If discussed at [place] of Stay Meetings, dates discussed:    Additional Comments:  Dessa Phi, RN 11/20/2014, 2:20 PM

## 2014-11-20 NOTE — Care Management Note (Signed)
Case Management Note  Patient Details  Name: Peter [name] MRN: [id number] Date of Birth: [birth year]  Subjective/Objective:  64 y/o m admitted w/Syncope r/t etoh abuse, fall.OQ:HUTM abuse. Ortho-eval-non operative treatment.                 Action/Plan: PT-SNF, patient declines SNF. Will offer [facility] agency list, & wait for choice.   Expected Discharge Date:                  Expected Discharge Plan:  [place] (Patient declines SNF.Will offer [place] list.)  In-House Referral:  Clinical Social Work  Discharge planning Services  CM Consult  Post Acute Care Choice:    Choice offered to:  Patient  DME Arranged:    DME Agency:     HH Arranged:    [place] Agency:     Status of Service:  In process, will continue to follow  Medicare Important Message Given:    Date Medicare IM Given:    Medicare IM give by:    Date Additional Medicare IM Given:    Additional Medicare Important Message give by:     If discussed at [place] of Stay Meetings, dates discussed:    Additional Comments:  Dessa Phi, RN 11/20/2014, 2:05 PM

## 2014-11-20 NOTE — Progress Notes (Signed)
Patient called out from his room as Chaplain walked by. Chaplain visited patient. Patient rambled with his conversation. Chaplain listened and    11/20/14 1200  Clinical Encounter Type  Visited With Patient  Visit Type Initial;Spiritual support;Social support   provided support to the patient. Chaplain will follow patient.

## 2014-11-20 NOTE — Progress Notes (Signed)
Physical Therapy Treatment Patient Details Name: Peter [name] MRN: [id number] DOB: 1950/11/08 Today's Date: 11/20/2014    History of Present Illness Peter [name] is a 64 y.o. male with h/o hypertension, hyperlipidemia, copd, h./o stroke in the past, ETOH abuse, takes about 40 beers a day, was brought in by family after he was found on the floor. As per the patient, he reports he was walking to the bathroom and fell on the floor, his brother heard the fall and came to find him. He denies feeling dizzy prior to the fall, . He denies any other complaints other than the right hip pain. Hip xrays revealed focal minimal displaced fracture of the right greater trochanter without any definite intertrochanteric extension. For admission for syncope work up    PT Comments    Pt intermittent confused.  Assisted OOB to amb required + 2 assist and RW.  Very unsteady/ataxic gait.    Follow Up Recommendations  SNF     Equipment Recommendations       Recommendations for Other Services       Precautions / Restrictions      Mobility  Bed Mobility Overal bed mobility: Needs Assistance Bed Mobility: Supine to Sit     Supine to sit: Mod assist     General bed mobility comments: repeat functional cueing to stay on task.  Increased time.  Transfers Overall transfer level: Needs assistance Equipment used: Rolling walker (2 wheeled) Transfers: Sit to/from Stand Sit to Stand: +2 physical assistance;+2 safety/equipment;From elevated surface;Max assist         General transfer comment: 75% Vc's for direction  Hand over hand placement to utilize walker for increased stability.   Ambulation/Gait Ambulation/Gait assistance: +2 safety/equipment;Max assist Ambulation Distance (Feet): 18 Feet Assistive device: Rolling walker (2 wheeled) Gait Pattern/deviations: Step-to pattern;Decreased step length - right;Decreased step length - left;Ataxic;Shuffle Gait velocity: decreased   General Gait  Details: very unsteady, ataxic gait with difficulty initiating and stepping.  Short steps and shuffled gait.    Stairs            Wheelchair Mobility    Modified Rankin (Stroke Patients Only)       Balance                                    Cognition Arousal/Alertness: Awake/alert Behavior During Therapy: Impulsive   Area of Impairment: Orientation;Attention;Following commands                    Exercises      General Comments        Pertinent Vitals/Pain Pain Assessment: Faces Faces Pain Scale: Hurts little more Pain Location: R hip Pain Descriptors / Indicators: Discomfort Pain Intervention(s): Monitored during session;Repositioned    Home Living                      Prior Function            PT Goals (current goals can now be found in the care plan section) Progress towards PT goals: Progressing toward goals    Frequency  Min 3X/week    PT Plan      Co-evaluation             End of Session Equipment Utilized During Treatment: Gait belt Activity Tolerance: Treatment limited secondary to medical complications (Comment) Patient left: in chair;with call bell/phone within reach;with chair  alarm set     Time: 3736-6815 PT Time Calculation (min) (ACUTE ONLY): 18 min  Charges:  $Gait Training: 8-22 mins                    G Codes:      Rica Koyanagi  PTA [facility]  Acute  Rehab Pager      [phone]

## 2014-11-20 NOTE — Evaluation (Signed)
Occupational Therapy Evaluation Patient Details Name: Peter [name] MRN: [id number] DOB: 01-22-1951 Today's Date: 11/20/2014    History of Present Illness Peter [name] is a 64 y.o. male with h/o hypertension, hyperlipidemia, copd, h./o stroke in the past, ETOH abuse, takes about 40 beers a day, was brought in by family after he was found on the floor. As per the patient, he reports he was walking to the bathroom and fell on the floor, his brother heard the fall and came to find him. He denies feeling dizzy prior to the fall, . He denies any other complaints other than the right hip pain. Hip xrays revealed focal minimal displaced fracture of the right greater trochanter without any definite intertrochanteric extension. For admission for syncope work up   Clinical Impression   Pt admitted with fall. Pt currently with functional limitations due to the deficits listed below (see OT Problem List).  Pt will benefit from skilled OT to increase their safety and independence with ADL and functional mobility for ADL to facilitate discharge to venue listed below.      Follow Up Recommendations  SNF    Equipment Recommendations  3 in 1 bedside comode    Recommendations for Other Services       Precautions / Restrictions Precautions Precautions: Fall Restrictions Weight Bearing Restrictions: No RLE Weight Bearing: Weight bearing as tolerated      Mobility Bed Mobility Overal bed mobility: Needs Assistance Bed Mobility: Sit to Supine     Supine to sit: Mod assist Sit to supine: Max assist   General bed mobility comments: repeat functional cueing to stay on task.  Increased time.  Transfers Overall transfer level: Needs assistance Equipment used: 2 person hand held assist Transfers: Sit to/from [facility] Sit to Stand: +2 physical assistance;+2 safety/equipment;From elevated surface;Max assist Stand pivot transfers: Max assist;+2 physical assistance;+2  safety/equipment       General transfer comment: 75% Vc's for direction  Hand over hand placement to utilize walker for increased stability.     Balance     Sitting balance-Leahy Scale: Zero       Standing balance-Leahy Scale: Zero                              ADL Overall ADL's : Needs assistance/impaired Eating/Feeding: Set up;Sitting   Grooming: Minimal assistance;Sitting   Upper Body Bathing: Minimal assitance;Sitting   Lower Body Bathing: Sit to/from stand;+2 for physical assistance;+2 for safety/equipment;Total assistance   Upper Body Dressing : Minimal assistance;Sitting   Lower Body Dressing: Sit to/from stand;Total assistance;+2 for physical assistance;+2 for safety/equipment   Toilet Transfer: +2 for physical assistance;+2 for safety/equipment;Maximal assistance;BSC   Toileting- Clothing Manipulation and Hygiene: Sit to/from stand;Total assistance;+2 for physical assistance;+2 for safety/equipment         General ADL Comments: pt is impulsive and has trouble following commands               Pertinent Vitals/Pain Pain Assessment: Faces Faces Pain Scale: Hurts little more Pain Location: r hip Pain Descriptors / Indicators: Sore Pain Intervention(s): Monitored during session;Repositioned     Hand Dominance     Extremity/Trunk Assessment Upper Extremity Assessment RUE Deficits / Details: decreased control to reach  to face, trmors are present LUE Deficits / Details: same as R   Lower Extremity Assessment RLE Deficits / Details: very stiff to attempt to flex hip, did flex hip  sitting at edge LLE  Deficits / Details: very stiiff  like R but moves through ROM without assist   Cervical / Trunk Assessment Cervical / Trunk Assessment: Other exceptions Cervical / Trunk Exceptions: leans posteriorly   Communication Communication Communication: No difficulties (speech os slurred and slow)   Cognition Arousal/Alertness:  Awake/alert Behavior During Therapy: Impulsive   Area of Impairment: Attention;Following commands       Following Commands: Follows one step commands with increased time;Follows multi-step commands inconsistently                      Home Living Family/patient expects to be discharged to:: Private residence   Available Help at Discharge: Available PRN/intermittently Type of Home: House Home Access: Stairs to enter [employer] of Steps: 4 Entrance Stairs-Rails: None Home Layout: One level     Bathroom Shower/Tub: Corporate investment banker: [place]: Environmental consultant - 2 wheels;Cane - single point;Tub bench          Prior Functioning/Environment Level of Independence: Independent with assistive device(s)             OT Diagnosis: Generalized weakness   OT Problem List: Decreased strength;Decreased activity tolerance;Decreased knowledge of precautions;Decreased safety awareness;Decreased cognition   OT Treatment/Interventions: Self-care/ADL training;DME and/or AE instruction;Patient/family education    OT Goals(Current goals can be found in the care plan section) Acute Rehab OT Goals Patient Stated Goal: get on commode  OT Goal Formulation: With patient Time For Goal Achievement: 12/04/14 Potential to Achieve Goals: Good  OT Frequency: Min 2X/week   Barriers to D/C:            Co-evaluation              End of Session Nurse Communication: Mobility status  Activity Tolerance: Patient tolerated treatment well Patient left: in bed;with call bell/phone within reach;with bed alarm set   Time: 1255-1318 OT Time Calculation (min): 23 min Charges:  OT General Charges $OT Visit: 1 Procedure OT Evaluation $Initial OT Evaluation Tier I: 1 Procedure OT Treatments $Self Care/Home Management : 8-22 mins G-Codes:    Payton Mccallum D 11/28/14, 1:46 PM

## 2014-11-20 NOTE — Progress Notes (Addendum)
[facility] PROGRESS NOTE Interim History: 64 y.o. male with h/o hypertension, hyperlipidemia, copd, h./o stroke in the past, ETOH abuse, takes about 40 beers a day, was brought in by family after he was found on the floor.  Assessment/Plan: Syncope/Orthostic hypotension: - Troponin's negative. - Repeated echo showed improved EF. - Positive orthostatics, most likely hypovolemia. - Increase coreg, will get an appointment with cardiology as an outpatient.  Alcohol  withdrawls: - Started on thiamin and folate, monitor with CIWA. - Started on librium on admission and still has sign of withdralws - Will give extra doses.  History of stroke -  Cont ASA.   Minimally displace Right hip trochanteric fracture: - Ortho consulted rec that this considered a stable fracture. manage conservatively.  Chronic systolic heart failure: - Cont. coreg low dose. - Hold diuretics.   Code Status: full code.  DVT Prophylaxis: Family Communication: family at bedside Disposition Plan: home 1day   Consultants:  none  Procedures: Echo: The estimated ejection fraction was in the range of 50% to 55%. Wall motion was normal; there were no regional wall motion abnormalities. Doppler parameters are consistent with abnormal left ventricular relaxation (grade 1 diastolic dysfunction).   Antibiotics:  None  HPI/Subjective: No complains wants to go home.  Objective: Filed Vitals:   11/19/14 1317 11/19/14 1917 11/19/14 2215 11/20/14 0545  BP: 126/71  133/79 170/91  Pulse: 122  107 89  Temp:   99.5 F (37.5 C) 98.4 F (36.9 C)  TempSrc:   Oral Oral  Resp:   20 18  Height:      Weight:      SpO2:  96% 99% 100%    Intake/Output Summary (Last 24 hours) at 11/20/14 0734 Last data filed at 11/20/14 0700  Gross per 24 hour  Intake   1750 ml  Output   2350 ml  Net   -600 ml   Filed Weights   11/18/14 1745  Weight: 62.823 kg (138 lb 8 oz)    Exam:  General: Alert,  awake, oriented x3, in no acute distress. tremors HEENT: No bruits, no goiter.  Heart: Regular rate and rhythm. Lungs: Good air movement, clear Abdomen: Soft, nontender, nondistended, positive bowel sounds.  Neuro: Grossly intact, nonfocal.   Data Reviewed: Basic Metabolic Panel:  Recent Labs Lab 11/18/14 1505 11/19/14 0530 11/20/14 0517  NA 136 136 137  K 4.3 4.5 3.7  CL 102 103 100*  CO2 19* 21* 22  GLUCOSE 90 85 81  BUN 8 8 6   CREATININE 0.72 0.82 0.78  CALCIUM 8.9 8.8* 9.7   Liver Function Tests:  Recent Labs Lab 11/18/14 1505  AST 80*  ALT 40  ALKPHOS 96  BILITOT 0.4  PROT 7.5  ALBUMIN 3.9   No results for input(s): LIPASE, AMYLASE in the last 168 hours. No results for input(s): AMMONIA in the last 168 hours. CBC:  Recent Labs Lab 11/18/14 1505  WBC 3.5*  NEUTROABS 2.5  HGB 8.3*  HCT 25.5*  MCV 81.5  PLT 186   Cardiac Enzymes:  Recent Labs Lab 11/18/14 1757 11/18/14 2325 11/19/14 0530  TROPONINI <0.03 <0.03 <0.03   BNP (last 3 results) No results for input(s): BNP in the last 8760 hours.  ProBNP (last 3 results)  Recent Labs  05/28/14 0500  PROBNP 6527.0*    CBG: No results for input(s): GLUCAP in the last 168 hours.  No results found for this or any previous visit (from the past 240 hour(s)).   Studies:  Ct Head Wo Contrast  11/18/2014   CLINICAL DATA:  64 year old male with syncope today.  EXAM: CT HEAD WITHOUT CONTRAST  TECHNIQUE: Contiguous axial images were obtained from the base of the skull through the vertex without intravenous contrast.  COMPARISON:  05/25/2014 and prior studies  FINDINGS: Generalized cerebral atrophy and chronic small-vessel white matter ischemic changes again noted.  No acute intracranial abnormalities are identified, including mass lesion or mass effect, hydrocephalus, extra-axial fluid collection, midline shift, hemorrhage, or acute infarction.  The visualized bony calvarium is unremarkable.  Scattered  opacified ethmoid air cells and left frontal sinus noted. A small amount of fluid in the right maxillary sinus is present.  IMPRESSION: No evidence of acute intracranial abnormality.  Atrophy and chronic small-vessel white matter ischemic changes.  Acute on chronic sinus disease/ sinusitis.   Electronically Signed   By: Margarette Canada M.D.   On: 11/18/2014 15:27   Dg Hip Unilat With Pelvis 2-3 Views Right  11/18/2014   CLINICAL DATA:  Fall home today with right hip pain.  EXAM: RIGHT HIP (WITH PELVIS) 2-3 VIEWS  COMPARISON:  10/28/2013  FINDINGS: Examination demonstrates mild diffuse decreased bone mineralization. There are symmetric mild degenerative changes of the hips. There is a mildly displaced fracture focally involving the right greater trochanter. No definite evidence of extension of this fracture through the trochanteric region. Remainder the exam is unchanged.  IMPRESSION: Focal minimally displaced fracture of the right greater trochanter. No definite intertrochanteric extension.   Electronically Signed   By: Marin Olp M.D.   On: 11/18/2014 15:24    Scheduled Meds: . atorvastatin  80 mg Oral Daily  . brimonidine  1 drop Both Eyes BID  . budesonide-formoterol  2 puff Inhalation BID  . carvedilol  6.25 mg Oral BID WC  . chlordiazePOXIDE  25 mg Oral TID   Followed by  . [START ON 11/21/2014] chlordiazePOXIDE  25 mg Oral BH-qamhs   Followed by  . [START ON 11/22/2014] chlordiazePOXIDE  25 mg Oral Daily  . chlordiazePOXIDE  25 mg Oral QID  . enoxaparin (LOVENOX) injection  40 mg Subcutaneous Q24H  . folic acid  1 mg Oral Daily  . lisinopril  5 mg Oral Daily  . LORazepam      . multivitamin with minerals  1 tablet Oral Daily  . nicotine  21 mg Transdermal Daily  . pantoprazole  40 mg Oral Daily  . thiamine  100 mg Oral Daily   Or  . thiamine  100 mg Intravenous Daily   Continuous Infusions: . sodium chloride 1,000 mL (11/19/14 1318)    Time Spent: 25 min   FELIZ Marguarite Arbour  [facility] Pager [phone]. If 7PM-7AM, please contact night-coverage at [email or web address], password [facility] 11/20/2014, 7:34 AM  LOS: 2 days

## 2014-11-21 DIAGNOSIS — F10239 Alcohol dependence with withdrawal, unspecified: Secondary | ICD-10-CM | POA: Diagnosis present

## 2014-11-21 DIAGNOSIS — F10939 Alcohol use, unspecified with withdrawal, unspecified: Secondary | ICD-10-CM | POA: Diagnosis present

## 2014-11-21 DIAGNOSIS — F05 Delirium due to known physiological condition: Secondary | ICD-10-CM | POA: Diagnosis present

## 2014-11-21 LAB — BASIC METABOLIC PANEL
Anion gap: 12 (ref 5–15)
BUN: 9 mg/dL (ref 6–20)
CHLORIDE: 103 mmol/L (ref 101–111)
CO2: 21 mmol/L — ABNORMAL LOW (ref 22–32)
CREATININE: 0.87 mg/dL (ref 0.61–1.24)
Calcium: 9 mg/dL (ref 8.9–10.3)
GFR calc non Af Amer: >60 mL/min (ref >60)
Glucose, Bld: 85 mg/dL (ref 65–99)
Potassium: 3.8 mmol/L (ref 3.5–5.1)
Sodium: 136 mmol/L (ref 135–145)

## 2014-11-21 MED ORDER — ADULT MULTIVITAMIN W/MINERALS CH
1.0000 | ORAL_TABLET | Freq: Every day | ORAL | Status: DC
Start: 2014-11-21 — End: 2014-11-23
  Administered 2014-11-21 – 2014-11-23 (×3): 1 via ORAL
  Filled 2014-11-21 (×3): qty 1

## 2014-11-21 MED ORDER — DEXTROSE-NACL 5-0.45 % IV SOLN
INTRAVENOUS | Status: AC
  Administered 2014-11-21 (×2): via INTRAVENOUS

## 2014-11-21 MED ORDER — CHLORDIAZEPOXIDE HCL 25 MG PO CAPS
25.0000 mg | ORAL_CAPSULE | ORAL | Status: DC
  Administered 2014-11-23: 25 mg via ORAL
  Filled 2014-11-21: qty 1

## 2014-11-21 MED ORDER — QUETIAPINE FUMARATE 50 MG PO TABS
50.0000 mg | ORAL_TABLET | Freq: Every day | ORAL | Status: DC
  Administered 2014-11-21 – 2014-11-22 (×2): 50 mg via ORAL
  Filled 2014-11-21 (×2): qty 1

## 2014-11-21 MED ORDER — HYDROXYZINE HCL 25 MG PO TABS
25.0000 mg | ORAL_TABLET | Freq: Four times a day (QID) | ORAL | Status: DC | PRN
  Administered 2014-11-22 – 2014-11-23 (×2): 25 mg via ORAL
  Filled 2014-11-21 (×2): qty 1

## 2014-11-21 MED ORDER — CHLORDIAZEPOXIDE HCL 25 MG PO CAPS
25.0000 mg | ORAL_CAPSULE | Freq: Three times a day (TID) | ORAL | Status: AC
  Administered 2014-11-22 (×3): 25 mg via ORAL
  Filled 2014-11-21 (×3): qty 1

## 2014-11-21 MED ORDER — HALOPERIDOL LACTATE 5 MG/ML IJ SOLN
2.5000 mg | Freq: Once | INTRAMUSCULAR | Status: AC
  Administered 2014-11-21: 2.5 mg via INTRAVENOUS
  Filled 2014-11-21: qty 1

## 2014-11-21 MED ORDER — LOPERAMIDE HCL 2 MG PO CAPS: 2.0000 mg | ORAL_CAPSULE | ORAL | Status: DC | PRN

## 2014-11-21 MED ORDER — CHLORDIAZEPOXIDE HCL 25 MG PO CAPS: 25.0000 mg | ORAL_CAPSULE | Freq: Every day | ORAL | Status: DC

## 2014-11-21 MED ORDER — CHLORDIAZEPOXIDE HCL 25 MG PO CAPS
25.0000 mg | ORAL_CAPSULE | Freq: Four times a day (QID) | ORAL | Status: DC | PRN
  Administered 2014-11-22: 25 mg via ORAL
  Filled 2014-11-21: qty 1

## 2014-11-21 MED ORDER — THIAMINE HCL 100 MG/ML IJ SOLN
100.0000 mg | Freq: Once | INTRAMUSCULAR | Status: AC
  Administered 2014-11-21: 100 mg via INTRAMUSCULAR
  Filled 2014-11-21: qty 1

## 2014-11-21 MED ORDER — ONDANSETRON 4 MG PO TBDP: 4.0000 mg | ORAL_TABLET | Freq: Four times a day (QID) | ORAL | Status: DC | PRN

## 2014-11-21 MED ORDER — VITAMIN B-1 100 MG PO TABS: 100.0000 mg | ORAL_TABLET | Freq: Every day | ORAL | Status: DC

## 2014-11-21 MED ORDER — CHLORDIAZEPOXIDE HCL 25 MG PO CAPS
25.0000 mg | ORAL_CAPSULE | Freq: Four times a day (QID) | ORAL | Status: AC
  Administered 2014-11-21 (×4): 25 mg via ORAL
  Filled 2014-11-21 (×4): qty 1

## 2014-11-21 NOTE — Progress Notes (Signed)
Patient is agitated trying to get out of bed to see if "mama is playing cards". Also the patient wants to get out of bed and collect his lighter and "go smoke".  PCP was notified

## 2014-11-21 NOTE — Progress Notes (Signed)
Spoke with patient today regarding his "fall" last night. He said he did not fall. He "crawled" out of bed and was crawling on the floor looking for cigarettes. He said staff had told him he could not walk so he decided to crawl.

## 2014-11-21 NOTE — Progress Notes (Signed)
   11/21/14 0308  What Happened  Was fall witnessed? No  Was patient injured? No  Patient found on floor  Found by Staff-comment Dellie Catholic)  Stated prior activity other (comment) (lying still in bed)  Follow Up  MD notified yes  Time MD notified 0307  Family notified Yes-comment Wilnette Kales (mother))  Time family notified 0345  Additional tests No  Simple treatment Other (comment) (warm blanket)  Progress note created (see row info) Yes  Adult Fall Risk Assessment  Risk Factor Category (scoring not indicated) High fall risk per protocol (document High fall risk)  Age 64  Fall History: Fall within 6 months prior to admission 5  Elimination; Bowel and/or Urine Incontinence 2  Elimination; Bowel and/or Urine Urgency/Frequency 2  Medications: includes PCA/Opiates, Anti-convulsants, Anti-hypertensives, Diuretics, Hypnotics, Laxatives, Sedatives, and Psychotropics 5  Patient Care Equipment 1  Mobility-Assistance 2  Mobility-Gait 2  Mobility-Sensory Deficit 2  Cognition-Awareness 1  Cognition-Impulsiveness 2  Cognition-Limitations 4  Total Score 29  Patient's Fall Risk High Fall Risk (>13 points)  Adult Fall Risk Interventions  Required Bundle Interventions *See Row Information* High fall risk - low, moderate, and high requirements implemented  Additional Interventions Bed alarm not indicated with the bundle;Individualized elimination schedule;Secure all tubes/drains;Reorient/diversional activities with confused patients  Fall with Injury Screening  Risk For Fall Injury- See Row Information  D  Intervention(s) for 2 or more risk criteria identified Floor Mat  Vitals  Temp 98.5 F (36.9 C)  BP (!) 149/88 mmHg  BP Location Left Arm  BP Method Automatic  Patient Position (if appropriate) Lying  Pulse Rate 99  Pulse Rate Source Dinamap  Resp 18  Oxygen Therapy  SpO2 100 %  O2 Device Room Air  PAINAD (Pain Assessment in Advanced Dementia)  Breathing 1  Negative  Vocalization 1  Facial Expression 0  Body Language 1  Consolability 0  PAINAD Score 3  Neurological  Neuro (WDL) X  Level of Consciousness Alert  Orientation Level Oriented to person;Disoriented to place;Disoriented to time;Disoriented to situation  Cognition Impulsive;Poor attention/concentration  Speech Clear  Musculoskeletal  Musculoskeletal (WDL) X  Assistive Device None  Generalized Weakness Yes  Weight Bearing Restrictions No  Musculoskeletal Details  RUE Full movement  LUE Full movement  RLE Limited movement  LLE Full movement  Integumentary  Integumentary (WDL) X  Skin Color Appropriate for ethnicity  Skin Condition Dry;Flaky  Skin Integrity Intact  Skin Turgor Non-tenting

## 2014-11-21 NOTE — Progress Notes (Signed)
Patient is really agitated.  PCP was notified.

## 2014-11-21 NOTE — Progress Notes (Signed)
Pt resting quietly in bed. Pt educated on restraint release criteria. Pt  stated " I can do all that if you take this belt off me." Restraints discontinued. Pt mother at bedside. Will continue to monitor.

## 2014-11-21 NOTE — Progress Notes (Signed)
Pt quietly laying in bed watch TV. Pt remains out of restraints and compliant with release criteria. Pt mother at bedside. Will continue to monitor.

## 2014-11-21 NOTE — Progress Notes (Signed)
Pt laying in bed watching TV. Pt refusing breakfast at this time. Pt mother st bedside. Will continue to monitor.

## 2014-11-21 NOTE — Progress Notes (Signed)
Patient's mother Wilnette Kales) came to visit son at the hospital. Patient has been slightly quieter since his mother arrived at the 74

## 2014-11-21 NOTE — Clinical Social Work Placement (Signed)
CSW met with patient & mother, Daylene Katayama at bedside - provided SNF bed offers. Patient's mother states that patient had been to [facility] in the past and would prefer that he go there as it's close to their home. Santiago Glad at [facility] SN[facility] aware.     Raynaldo Opitz, [facility] Clinical Social Worker cell #: [phone]    CLINICAL SOCIAL WORK PLACEMENT  NOTE  Date:  11/21/2014  Patient Details  Name: Peter [name] MRN: [id number] Date of Birth: 1950-09-03  Clinical Social Work is seeking post-discharge placement for this patient at the [place] level of care (*CSW will initial, date and re-position this form in  chart as items are completed):  Yes   Patient/family provided with [place] Work Department's list of facilities offering this level of care within the geographic area requested by the patient (or if unable, by the patient's family).  Yes   Patient/family informed of their freedom to choose among providers that offer the needed level of care, that participate in Medicare, Medicaid or managed care program needed by the patient, have an available bed and are willing to accept the patient.  Yes   Patient/family informed of [place]'s ownership interest in [facility] and [facility], as well as of the fact that they are under no obligation to receive care at these facilities.  PASRR submitted to EDS on       PASRR number received on       Existing PASRR number confirmed on 11/20/14     FL2 transmitted to all facilities in geographic area requested by pt/family on 11/20/14     FL2 transmitted to all facilities within larger geographic area on       Patient informed that his/her managed care company has contracts with or will negotiate with certain facilities, including the following:        Yes   Patient/family informed of bed offers received.  Patient chooses bed at [facility]     Physician recommends and patient chooses bed at      Patient to be transferred to [facility] on  .  Patient to be transferred to facility by       Patient family notified on   of transfer.  Name of family member notified:        PHYSICIAN       Additional Comment:    _______________________________________________ Standley Brooking, LCSW 11/21/2014, 11:42 AM

## 2014-11-21 NOTE — Progress Notes (Signed)
[facility] PROGRESS NOTE Interim History: 64 y.o. male with h/o hypertension, hyperlipidemia, copd, h./o stroke in the past, ETOH abuse, takes about 40 beers a day, was brought in by family after he was found on the floor.  Assessment/Plan: Syncope/Orthostic hypotension: - Troponin's negative. - Repeated echo showed improved EF. - Positive orthostatics, most likely hypovolemia. - Increase coreg, will get an appointment with cardiology as an outpatient.  Alcohol  withdrawals now with acute confusional state: - Started on thiamin and folate on admission. CIWA score improved with librium. - Started on librium but became agitated overnight. Will cont librium protocol add haldol for possible delirium. - According to mother his mentation has fluctuated. - use seroquel at bedtime, d/c restrains.  History of stroke -  Cont ASA.   Minimally displace Right hip trochanteric fracture: - Ortho consulted rec that this considered a stable fracture. manage conservatively.  Chronic systolic heart failure: - Cont. coreg low dose and lisinopril.  Code Status: full code.  DVT Prophylaxis:heparin Family Communication: family at bedside Disposition Plan: SNF in am.   Consultants:  none  Procedures: Echo: The estimated ejection fraction was in the range of 50% to 55%. Wall motion was normal; there were no regional wall motion abnormalities. Doppler parameters are consistent with abnormal left ventricular relaxation (grade 1 diastolic dysfunction).   Antibiotics:  None  HPI/Subjective: No complains wants to go home.  Objective: Filed Vitals:   11/20/14 2245 11/21/14 0308 11/21/14 0330 11/21/14 0652  BP:  149/88 144/87 149/92  Pulse:  99 93 104  Temp:  98.5 F (36.9 C) 98.2 F (36.8 C) 98.4 F (36.9 C)  TempSrc:   Oral Oral  Resp:  18 16 20   Height:      Weight:      SpO2: 93% 100% 100% 100%    Intake/Output Summary (Last 24 hours) at 11/21/14 0908 Last  data filed at 11/21/14 0500  Gross per 24 hour  Intake    960 ml  Output   1350 ml  Net   -390 ml   Filed Weights   11/18/14 1745  Weight: 62.823 kg (138 lb 8 oz)    Exam:  General: Alert, awake, oriented x3, in no acute distress. tremors HEENT: No bruits, no goiter.  Heart: Regular rate and rhythm. Lungs: Good air movement, clear Abdomen: Soft, nontender, nondistended, positive bowel sounds.  Neuro: Grossly intact, nonfocal.   Data Reviewed: Basic Metabolic Panel:  Recent Labs Lab 11/18/14 1505 11/19/14 0530 11/20/14 0517 11/21/14 0535  NA 136 136 137 136  K 4.3 4.5 3.7 3.8  CL 102 103 100* 103  CO2 19* 21* 22 21*  GLUCOSE 90 85 81 85  BUN 8 8 6 9   CREATININE 0.72 0.82 0.78 0.87  CALCIUM 8.9 8.8* 9.7 9.0   Liver Function Tests:  Recent Labs Lab 11/18/14 1505  AST 80*  ALT 40  ALKPHOS 96  BILITOT 0.4  PROT 7.5  ALBUMIN 3.9   No results for input(s): LIPASE, AMYLASE in the last 168 hours. No results for input(s): AMMONIA in the last 168 hours. CBC:  Recent Labs Lab 11/18/14 1505  WBC 3.5*  NEUTROABS 2.5  HGB 8.3*  HCT 25.5*  MCV 81.5  PLT 186   Cardiac Enzymes:  Recent Labs Lab 11/18/14 1757 11/18/14 2325 11/19/14 0530  TROPONINI <0.03 <0.03 <0.03   BNP (last 3 results) No results for input(s): BNP in the last 8760 hours.  ProBNP (last 3 results)  Recent Labs  05/28/14 0500  PROBNP 6527.0*    CBG: No results for input(s): GLUCAP in the last 168 hours.  No results found for this or any previous visit (from the past 240 hour(s)).   Studies: No results found.  Scheduled Meds: . atorvastatin  80 mg Oral Daily  . brimonidine  1 drop Both Eyes BID  . budesonide-formoterol  2 puff Inhalation BID  . carvedilol  6.25 mg Oral BID WC  . chlordiazePOXIDE  25 mg Oral QID   Followed by  . [START ON 11/22/2014] chlordiazePOXIDE  25 mg Oral TID   Followed by  . [START ON 11/23/2014] chlordiazePOXIDE  25 mg Oral BH-qamhs   Followed by   . [START ON 11/24/2014] chlordiazePOXIDE  25 mg Oral Daily  . enoxaparin (LOVENOX) injection  40 mg Subcutaneous Q24H  . folic acid  1 mg Oral Daily  . lisinopril  5 mg Oral Daily  . multivitamin with minerals  1 tablet Oral Daily  . multivitamin with minerals  1 tablet Oral Daily  . nicotine  21 mg Transdermal Daily  . pantoprazole  40 mg Oral Daily  . thiamine  100 mg Oral Daily   Or  . thiamine  100 mg Intravenous Daily  . thiamine  100 mg Intramuscular Once  . [START ON 11/22/2014] thiamine  100 mg Oral Daily   Continuous Infusions: . sodium chloride 1,000 mL (11/20/14 2356)    Time Spent: 25 min   FELIZ Marguarite Arbour  [facility] Pager [phone]. If 7PM-7AM, please contact night-coverage at [email or web address], password [facility] 11/21/2014, 9:08 AM  LOS: 3 days

## 2014-11-21 NOTE — Progress Notes (Deleted)
Progress note

## 2014-11-22 DIAGNOSIS — F05 Delirium due to known physiological condition: Secondary | ICD-10-CM

## 2014-11-22 DIAGNOSIS — F10239 Alcohol dependence with withdrawal, unspecified: Secondary | ICD-10-CM

## 2014-11-22 NOTE — Progress Notes (Signed)
[facility] PROGRESS NOTE  Peter [name] DTO:[id number] DOB: 1951/04/10 DOA: 11/18/2014 PCP: Lorayne Marek, MD  Brief narrative 64 year old male with alcohol abuse, alcoholic cardiomyopathy, hypertension, COPD, history of stroke, hyperlipidemia brought to the ED after he was found on the floor. Patient admitted for syncope with orthostatic hypotension . Hospital course prolonged due to delirium with alcohol withdrawal symptoms.   Assessment/Plan: Syncope possibly due to orthostatic hypotension Resolved with hydration. 2-D echo with improved EF of 50-55% with grade 1 diastolic dysfunction. (Last echo showed EF of 30-35% possibly in the setting of alcoholic cardiomyopathy). -Coreg dose increased given elevated heart rate.. -Seen by physical therapy and recommended skilled nursing facility.  Alcohol withdrawal with delirium Patient of restrained. Currently on Librium taper and bedtime Seroquel. Patient is well alert and oriented but has episodes of confusion and mildly tremulous on exam this morning. Continue thiamine and folate and multivitamin.  Minimally displaced right hip trochanteric fracture Seen by orthopedics consult and given stable fracture recommend conservative management.   Tobacco abuse Continue nicotine patch.  Alcoholic cardiomyopathy Improved EF on echo his admission. Continue aspirin, Coreg and lisinopril.   History of stroke Continue aspirin and statin    COPD Stable. Continue home inhalers.  DVT prophylaxis: Subcutaneous Lovenox  Diet: Heart healthy   Code Status: Full code Family Communication: Mother at bedside Disposition Plan: Skilled nursing facility on 5/19 if mental status continues to improve.   Consultants:  None  Procedures:  2-D echo  Antibiotics:  None  HPI/Subjective: Patient seen and examined. Mother at bedside who reports episodes of confusion. No further falls reported. Patient denies any specific  symptoms.  Objective: Filed Vitals:   11/22/14 1014  BP: 127/72  Pulse: 108  Temp:   Resp:     Intake/Output Summary (Last 24 hours) at 11/22/14 1312 Last data filed at 11/22/14 0501  Gross per 24 hour  Intake 1433.34 ml  Output    725 ml  Net 708.34 ml   Filed Weights   11/18/14 1745  Weight: 62.823 kg (138 lb 8 oz)    Exam:   General:  Elderly male lying in bed in no acute distress  HEENT: No pallor, moist oral mucosa, no JVD, supple neck  Chest: Clear to auscultation bilaterally, no added sounds  CVS: Normal S1 and S2, no murmurs rub or gallop  GI: Soft, nondistended, nontender, bowel sounds present  Musculoskeletal: Warm, no edema, limited movement of right hip  CNS: Alert and oriented, nonfocal, mild tremors  Data Reviewed: Basic Metabolic Panel:  Recent Labs Lab 11/18/14 1505 11/19/14 0530 11/20/14 0517 11/21/14 0535  NA 136 136 137 136  K 4.3 4.5 3.7 3.8  CL 102 103 100* 103  CO2 19* 21* 22 21*  GLUCOSE 90 85 81 85  BUN 8 8 6 9   CREATININE 0.72 0.82 0.78 0.87  CALCIUM 8.9 8.8* 9.7 9.0   Liver Function Tests:  Recent Labs Lab 11/18/14 1505  AST 80*  ALT 40  ALKPHOS 96  BILITOT 0.4  PROT 7.5  ALBUMIN 3.9   No results for input(s): LIPASE, AMYLASE in the last 168 hours. No results for input(s): AMMONIA in the last 168 hours. CBC:  Recent Labs Lab 11/18/14 1505  WBC 3.5*  NEUTROABS 2.5  HGB 8.3*  HCT 25.5*  MCV 81.5  PLT 186   Cardiac Enzymes:  Recent Labs Lab 11/18/14 1757 11/18/14 2325 11/19/14 0530  TROPONINI <0.03 <0.03 <0.03   BNP (last 3 results) No results for input(s):  BNP in the last 8760 hours.  ProBNP (last 3 results)  Recent Labs  05/28/14 0500  PROBNP 6527.0*    CBG: No results for input(s): GLUCAP in the last 168 hours.  No results found for this or any previous visit (from the past 240 hour(s)).   Studies: No results found.  Scheduled Meds: . atorvastatin  80 mg Oral Daily  .  brimonidine  1 drop Both Eyes BID  . budesonide-formoterol  2 puff Inhalation BID  . carvedilol  6.25 mg Oral BID WC  . chlordiazePOXIDE  25 mg Oral TID   Followed by  . [START ON 11/23/2014] chlordiazePOXIDE  25 mg Oral BH-qamhs   Followed by  . [START ON 11/24/2014] chlordiazePOXIDE  25 mg Oral Daily  . enoxaparin (LOVENOX) injection  40 mg Subcutaneous Q24H  . folic acid  1 mg Oral Daily  . lisinopril  5 mg Oral Daily  . multivitamin with minerals  1 tablet Oral Daily  . nicotine  21 mg Transdermal Daily  . pantoprazole  40 mg Oral Daily  . QUEtiapine  50 mg Oral QHS  . thiamine  100 mg Oral Daily   Or  . thiamine  100 mg Intravenous Daily   Continuous Infusions:    Time spent: 25 minutes    Marylynne Keelin, [place]  [facility] Pager [phone]. If 7PM-7AM, please contact night-coverage at [email or web address], password [facility] 11/22/2014, 1:12 PM  LOS: 4 days

## 2014-11-22 NOTE — Care Management Note (Signed)
Case Management Note  Patient Details  Name: Peter [name] MRN: [id number] Date of Birth: 09-May-1951  Subjective/Objective:                    Action/Plan:   Expected Discharge Date:                  Expected Discharge Plan:  [name] (Patient now agrees to SNF.CSW notified.)  In-House Referral:  Clinical Social Work  Discharge planning Services  CM Consult  Post Acute Care Choice:    Choice offered to:     DME Arranged:    DME Agency:     HH Arranged:    HH Agency:     Status of Service:  In process, will continue to follow  Medicare Important Message Given:    Date Medicare IM Given:    Medicare IM give by:    Date Additional Medicare IM Given:    Additional Medicare Important Message give by:     If discussed at [place] of Stay Meetings, dates discussed:    Additional Comments:  Dessa Phi, RN 11/22/2014, 1:57 PM

## 2014-11-22 NOTE — Progress Notes (Signed)
Physical Therapy Treatment Patient Details Name: Peter [name] MRN: [id number] DOB: June 09, 1951 Today's Date: 11/22/2014    History of Present Illness Peter [name] is a 64 y.o. male with h/o hypertension, hyperlipidemia, copd, h./o stroke in the past, ETOH abuse, takes about 40 beers a day, was brought in by family after he was found on the floor. As per the patient, he reports he was walking to the bathroom and fell on the floor, his brother heard the fall and came to find him. He denies feeling dizzy prior to the fall, . He denies any other complaints other than the right hip pain. Hip xrays revealed focal minimal displaced fracture of the right greater trochanter without any definite intertrochanteric extension. For admission for syncope work up    PT Comments    Assisted pt OOB to amb a limited distance use RW and + 2 assist.  Very unsteady gait.  Pt will need ST Rehab at SNF to regain prior level of mobility and balance.  Prior pt was home amb with a cane.   Follow Up Recommendations  SNF     Equipment Recommendations       Recommendations for Other Services       Precautions / Restrictions Precautions Precautions: Fall Restrictions Weight Bearing Restrictions: No RLE Weight Bearing: Weight bearing as tolerated    Mobility  Bed Mobility Overal bed mobility: Needs Assistance Bed Mobility: Supine to Sit     Supine to sit: Min guard;Min assist     General bed mobility comments: repeat functional cueing to stay on task.  Increased time.  Transfers Overall transfer level: Needs assistance Equipment used: Rolling walker (2 wheeled) Transfers: Sit to/from Stand Sit to Stand: +2 safety/equipment;Mod assist         General transfer comment: 75% Vc's for direction  Hand over hand placement to utilize walker for increased stability.   Ambulation/Gait Ambulation/Gait assistance: +2 safety/equipment;Mod assist;Max assist Ambulation Distance (Feet): 24 Feet Assistive  device: Rolling walker (2 wheeled) Gait Pattern/deviations: Step-to pattern;Step-through pattern;Decreased stride length;Decreased step length - right;Decreased step length - left;Decreased stance time - right;Trunk flexed Gait velocity: decreased   General Gait Details: very unsteady, ataxic gait with difficulty initiating and stepping.  Short steps and shuffled gait. HIGH FALL RISK.   Stairs            Wheelchair Mobility    Modified Rankin (Stroke Patients Only)       Balance                                    Cognition Arousal/Alertness: Awake/alert         Current Attention Level: Sustained   Following Commands: Follows one step commands with increased time;Follows multi-step commands inconsistently            Exercises      General Comments        Pertinent Vitals/Pain Pain Assessment: Faces Faces Pain Scale: Hurts little more Pain Location: R hip Pain Descriptors / Indicators: Sore Pain Intervention(s): Monitored during session;Repositioned    Home Living                      Prior Function            PT Goals (current goals can now be found in the care plan section) Progress towards PT goals: Progressing toward goals    Frequency  Min 3X/week    PT Plan      Co-evaluation             End of Session Equipment Utilized During Treatment: Gait belt Activity Tolerance: Patient tolerated treatment well Patient left: in chair;with call bell/phone within reach;with chair alarm set     Time: 2081-3887 PT Time Calculation (min) (ACUTE ONLY): 25 min  Charges:  $Gait Training: 8-22 mins $Therapeutic Activity: 8-22 mins                    G Codes:      Rica Koyanagi  PTA [facility]  Acute  Rehab Pager      [phone]

## 2014-11-23 DIAGNOSIS — D649 Anemia, unspecified: Secondary | ICD-10-CM | POA: Diagnosis not present

## 2014-11-23 DIAGNOSIS — S72111S Displaced fracture of greater trochanter of right femur, sequela: Secondary | ICD-10-CM | POA: Diagnosis not present

## 2014-11-23 DIAGNOSIS — G3184 Mild cognitive impairment, so stated: Secondary | ICD-10-CM | POA: Diagnosis not present

## 2014-11-23 DIAGNOSIS — S72114D Nondisplaced fracture of greater trochanter of right femur, subsequent encounter for closed fracture with routine healing: Secondary | ICD-10-CM

## 2014-11-23 DIAGNOSIS — K219 Gastro-esophageal reflux disease without esophagitis: Secondary | ICD-10-CM | POA: Diagnosis not present

## 2014-11-23 DIAGNOSIS — I10 Essential (primary) hypertension: Secondary | ICD-10-CM | POA: Diagnosis not present

## 2014-11-23 DIAGNOSIS — R262 Difficulty in walking, not elsewhere classified: Secondary | ICD-10-CM | POA: Diagnosis not present

## 2014-11-23 DIAGNOSIS — F339 Major depressive disorder, recurrent, unspecified: Secondary | ICD-10-CM | POA: Diagnosis not present

## 2014-11-23 DIAGNOSIS — F10229 Alcohol dependence with intoxication, unspecified: Secondary | ICD-10-CM | POA: Diagnosis not present

## 2014-11-23 DIAGNOSIS — F10231 Alcohol dependence with withdrawal delirium: Secondary | ICD-10-CM | POA: Diagnosis not present

## 2014-11-23 DIAGNOSIS — R55 Syncope and collapse: Secondary | ICD-10-CM

## 2014-11-23 DIAGNOSIS — H409 Unspecified glaucoma: Secondary | ICD-10-CM | POA: Diagnosis not present

## 2014-11-23 DIAGNOSIS — Z9181 History of falling: Secondary | ICD-10-CM | POA: Diagnosis not present

## 2014-11-23 DIAGNOSIS — F05 Delirium due to known physiological condition: Secondary | ICD-10-CM | POA: Diagnosis not present

## 2014-11-23 DIAGNOSIS — R5381 Other malaise: Secondary | ICD-10-CM | POA: Diagnosis not present

## 2014-11-23 DIAGNOSIS — K7030 Alcoholic cirrhosis of liver without ascites: Secondary | ICD-10-CM | POA: Diagnosis not present

## 2014-11-23 DIAGNOSIS — M25511 Pain in right shoulder: Secondary | ICD-10-CM | POA: Diagnosis not present

## 2014-11-23 DIAGNOSIS — M6281 Muscle weakness (generalized): Secondary | ICD-10-CM | POA: Diagnosis not present

## 2014-11-23 DIAGNOSIS — J449 Chronic obstructive pulmonary disease, unspecified: Secondary | ICD-10-CM | POA: Diagnosis not present

## 2014-11-23 DIAGNOSIS — E785 Hyperlipidemia, unspecified: Secondary | ICD-10-CM | POA: Diagnosis not present

## 2014-11-23 DIAGNOSIS — S72114A Nondisplaced fracture of greater trochanter of right femur, initial encounter for closed fracture: Secondary | ICD-10-CM | POA: Diagnosis not present

## 2014-11-23 DIAGNOSIS — F419 Anxiety disorder, unspecified: Secondary | ICD-10-CM | POA: Diagnosis not present

## 2014-11-23 DIAGNOSIS — R2681 Unsteadiness on feet: Secondary | ICD-10-CM | POA: Diagnosis not present

## 2014-11-23 MED ORDER — CARVEDILOL 6.25 MG PO TABS: 6.2500 mg | ORAL_TABLET | Freq: Two times a day (BID) | ORAL | Status: DC

## 2014-11-23 MED ORDER — LISINOPRIL 5 MG PO TABS: 5.0000 mg | ORAL_TABLET | Freq: Every day | ORAL | Status: DC

## 2014-11-23 MED ORDER — CHLORDIAZEPOXIDE HCL 25 MG PO CAPS: 25.0000 mg | ORAL_CAPSULE | Freq: Every day | ORAL | Status: AC

## 2014-11-23 NOTE — Clinical Social Work Placement (Signed)
Patient is set to discharge to [facility] today. Patient & mother, Peter [name] at bedside aware. Discharge packet given to RN, Shelby. [facility] called for transport.     Raynaldo Opitz, [facility] Clinical Social Worker cell #: [phone]    CLINICAL SOCIAL WORK PLACEMENT  NOTE  Date:  11/23/2014  Patient Details  Name: Peter [name] MRN: [id number] Date of Birth: 1950/09/08  Clinical Social Work is seeking post-discharge placement for this patient at the [place] level of care (*CSW will initial, date and re-position this form in  chart as items are completed):  Yes   Patient/family provided with [place] Work Department's list of facilities offering this level of care within the geographic area requested by the patient (or if unable, by the patient's family).  Yes   Patient/family informed of their freedom to choose among providers that offer the needed level of care, that participate in Medicare, Medicaid or managed care program needed by the patient, have an available bed and are willing to accept the patient.  Yes   Patient/family informed of [place]'s ownership interest in [facility] and [facility], as well as of the fact that they are under no obligation to receive care at these facilities.  PASRR submitted to EDS on       PASRR number received on       Existing PASRR number confirmed on 11/20/14     FL2 transmitted to all facilities in geographic area requested by pt/family on 11/20/14     FL2 transmitted to all facilities within larger geographic area on       Patient informed that his/her managed care company has contracts with or will negotiate with certain facilities, including the following:        Yes   Patient/family informed of bed offers received.  Patient chooses bed at [facility]     Physician recommends and patient chooses bed at      Patient to be transferred to  [facility] on 11/23/14.  Patient to be transferred to facility by [facility]     Patient family notified on 11/23/14 of transfer.  Name of family member notified:  patient's mother, Peter [name] at bedside     PHYSICIAN       Additional Comment:    _______________________________________________ Standley Brooking, LCSW 11/23/2014, 10:14 AM

## 2014-11-23 NOTE — Care Management Note (Signed)
Case Management Note  Patient Details  Name: EPIMENIO SCHETTER MRN: [id number] Date of Birth: 01-Nov-1950  Subjective/Objective:                    Action/Plan:   Expected Discharge Date:                  Expected Discharge Plan:  [place] ([place]  In-House Referral:  Clinical Social Work  Discharge planning Services  CM Consult  Post Acute Care Choice:    Choice offered to:     DME Arranged:    DME Agency:     HH Arranged:    [place] Agency:     Status of Service:  Completed, signed off  Medicare Important Message Given:    Date Medicare IM Given:  11/20/14 Medicare IM give by:  Dessa Phi Date Additional Medicare IM Given:  11/23/14 Additional Medicare Important Message give by:  Dessa Phi  If discussed at Long Length of Stay Meetings, dates discussed:  11/23/14  Additional Comments:  Dessa Phi, RN 11/23/2014, 12:05 PM

## 2014-11-23 NOTE — Discharge Summary (Addendum)
Physician Discharge Summary  Peter [name] IRS:[id number] DOB: 01-Feb-1951 DOA: 11/18/2014  PCP: Lorayne Marek, MD  Admit date: 11/18/2014 Discharge date: 11/23/2014  Time spent: 35 minutes  Recommendations for Outpatient Follow-up:  1. Discharge to SNF 2. Follow up with Dr Ninfa Linden ( [facility]) in 2 weeks. Please arrange for appt. 3. Please monitor for any etoh withdrawal symptoms and confusions.   Discharge Diagnoses:  Principal Problem:   Syncope   Active Problems:   Alcohol abuse   Alcohol withdrawal   COPD (chronic obstructive pulmonary disease)   History of stroke   Acute confusional state   Closed nondisplaced fracture of greater trochanter of right femur with routine healing   Discharge Condition: fair  Diet recommendation: heart healthy  Code status: full code  Filed Weights   11/18/14 1745  Weight: 62.823 kg (138 lb 8 oz)    History of present illness:    Hospital Course:  Syncope possibly due to orthostatic hypotension Resolved with hydration. 2-D echo with improved EF of 50-55% with grade 1 diastolic dysfunction. (Last echo showed EF of 30-35% possibly in the setting of alcoholic cardiomyopathy). -Coreg dose increased given elevated heart rate.. -Seen by physical therapy and recommended skilled nursing facility.  Alcohol withdrawal with delirium Placed  on Librium taper and bedtime Seroquel. Patient is well alert and oriented but has episodes of confusion tremors minimal on exam today.  Continue thiamine ,  folate and multivitamin. Patient will complete his librium taper on the morning of 5/20. Counseled  strongly on etoh cessation. Pt has access to  alcohol from his friends whom he lives with. Mother is unable to stay with him. Needs ongoing social support as outpt. Resumed acamprosate for etoh abstinence upon discharge.  -will resume home dose trazodone upon discharge.   Alcoholic cirrhosis  Stable at present  Minimally displaced  right hip trochanteric fracture Seen by orthopedics consult and given stable fracture recommend conservative management. He should follow up with [facility] in 2 weeks. No pain meds required.  Tobacco abuse Continue nicotine patch.counsled on cessation.  Alcoholic cardiomyopathy Improved EF on echo his admission ( 50-55%). Added coreg and  lisinopril. No varices seen on last EGD so will d/c propanolol. continue lasix.   History of stroke Continue  Statin. Allergic to ASA.    COPD Stable. Continue home inhalers and prn nebs  Anemia of chronic disease/ b12 def  hb at baseline. On cyanocobalamin. Iron panel normal   Patient seen by PT and recommend SNF. He si clinically stable to be discharged with outpt follow up  Family Communication: Mother at bedside Disposition Plan: Skilled nursing facility    Consultants:  None  Procedures:  2-D echo  Antibiotics:  None    Discharge Exam: Filed Vitals:   11/23/14 0835  BP: 111/62  Pulse: 104  Temp:   Resp:     General: Elderly male lying in bed in no acute distress  HEENT: No pallor, moist oral mucosa, no JVD, supple neck  Chest: Clear to auscultation bilaterally, no added sounds  CVS: Normal S1 and S2, no murmurs rub or gallop  GI: Soft, nondistended, nontender, bowel sounds present  Musculoskeletal: Warm, no edema, limited movement of right hip  CNS: Alert and oriented, nonfocal, mild tremors   Discharge Instructions    Current Discharge Medication List    START taking these medications   Details  chlordiazePOXIDE (LIBRIUM) 25 MG capsule Take 1 capsule (25 mg total) by mouth daily. Qty: 2 capsule, Refills:  0    lisinopril (PRINIVIL,ZESTRIL) 5 MG tablet Take 1 tablet (5 mg total) by mouth daily. Qty: 30 tablet, Refills: 0  Carvedilol 6.25 mg    tablet                              Take 1 tablet by mouth twice daily                                                                  Qty: 60  tablet, refills, 0    CONTINUE these medications which have NOT CHANGED   Details  acamprosate (CAMPRAL) 333 MG tablet Take 2 tablets (666 mg total) by mouth 3 (three) times daily with meals. Qty: 180 tablet, Refills: 2    albuterol (PROVENTIL HFA;VENTOLIN HFA) 108 (90 BASE) MCG/ACT inhaler Inhale 2 puffs into the lungs 4 (four) times daily. Qty: 1 Inhaler, Refills: 2    atorvastatin (LIPITOR) 80 MG tablet Take 1 tablet (80 mg total) by mouth daily. Qty: 30 tablet, Refills: 2   Associated Diagnoses: Chronic alcohol abuse; Alcoholic cirrhosis of liver without ascites; Depression (emotion); COPD, moderate    brimonidine (ALPHAGAN) 0.2 % ophthalmic solution Place 1 drop into both eyes 2 (two) times daily. Qty: 5 mL, Refills: 1    budesonide-formoterol (SYMBICORT) 80-4.5 MCG/ACT inhaler Inhale 2 puffs into the lungs 2 (two) times daily. Qty: 1 Inhaler, Refills: 2    folic acid (FOLVITE) 1 MG tablet Take 1 tablet (1 mg total) by mouth daily. Qty: 30 tablet, Refills: 2    furosemide (LASIX) 40 MG tablet Take 1 tablet (40 mg total) by mouth daily. Qty: 30 tablet, Refills: 2   Associated Diagnoses: Essential hypertension    pantoprazole (PROTONIX) 40 MG tablet Take 1 tablet (40 mg total) by mouth daily. Qty: 30 tablet, Refills: 2         ipratropium-albuterol (DUONEB) 0.5-2.5 (3) MG/3ML SOLN Take 3 mLs by nebulization every 4 (four) hours as needed. Qty: 360 mL    Multiple Vitamin (MULTIVITAMIN) tablet Take 1 tablet by mouth daily. Qty: 30 tablet, Refills: 0   Associated Diagnoses: Chronic alcohol abuse; Liver cirrhosis, alcoholic; Depression (emotion); COPD, moderate    nicotine (NICODERM CQ) 21 mg/24hr patch Place 1 patch (21 mg total) onto the skin daily. Qty: 28 patch, Refills: 0   Associated Diagnoses: Smoker    thiamine (VITAMIN B-1) 100 MG tablet Take 1 tablet (100 mg total) by mouth daily. For vitamin B deficiency. Qty: 30 tablet, Refills: 2   Associated Diagnoses:  Chronic alcohol abuse; Liver cirrhosis, alcoholic; Depression (emotion); COPD, moderate    traZODone (DESYREL) 50 MG tablet Take 1 tablet (50 mg total) by mouth at bedtime. Qty: 30 tablet, Refills: 0   Associated Diagnoses: Chronic alcohol abuse; Alcoholic cirrhosis of liver without ascites; Depression (emotion); COPD, moderate    vitamin B-12 (CYANOCOBALAMIN) 100 MCG tablet Take 1 tablet (100 mcg total) by mouth daily. For low B12. Qty: 30 tablet, Refills: 2   Associated Diagnoses: Chronic alcohol abuse; Liver cirrhosis, alcoholic; Depression (emotion); COPD, moderate    vitamin E 200 UNIT capsule Take 1 capsule (200 Units total) by mouth daily. Qty: 7 capsule, Refills: 0   Associated Diagnoses: Chronic  alcohol abuse; Liver cirrhosis, alcoholic; Depression (emotion); COPD, moderate     potassium chloride SA (K-DUR,KLOR-CON) 20 MEQ tablet   1 tablet daily      STOP taking these medications     HYDROcodone-acetaminophen (NORCO/VICODIN) 5-325 MG per tablet            azithromycin (ZITHROMAX) 250 MG tablet      clindamycin (CLEOCIN) 150 MG capsule      clotrimazole (LOTRIMIN) 1 % cream      ferrous sulfate 325 (65 FE) MG tablet      hydrOXYzine (ATARAX/VISTARIL) 25 MG tablet   propranolol (INDERAL) 10 MG tablet        Allergies  Allergen Reactions  . Aspirin     Unknown- MD said do not take this medication  . Poison Ivy Extract [Extract Of Poison Ivy] Rash   Follow-up Information    Follow up with Mcarthur Rossetti, MD. Schedule an appointment as soon as possible for a visit in 2 weeks.   Specialty:  Orthopedic Surgery   Contact information:   [facility] [place] [address] [phone]       Please follow up.   Why:  MD at SNF       The results of significant diagnostics from this hospitalization (including imaging, microbiology, ancillary and laboratory) are listed below for reference.    Significant Diagnostic Studies: Ct Head Wo  Contrast  11/18/2014   CLINICAL DATA:  64 year old male with syncope today.  EXAM: CT HEAD WITHOUT CONTRAST  TECHNIQUE: Contiguous axial images were obtained from the base of the skull through the vertex without intravenous contrast.  COMPARISON:  05/25/2014 and prior studies  FINDINGS: Generalized cerebral atrophy and chronic small-vessel white matter ischemic changes again noted.  No acute intracranial abnormalities are identified, including mass lesion or mass effect, hydrocephalus, extra-axial fluid collection, midline shift, hemorrhage, or acute infarction.  The visualized bony calvarium is unremarkable.  Scattered opacified ethmoid air cells and left frontal sinus noted. A small amount of fluid in the right maxillary sinus is present.  IMPRESSION: No evidence of acute intracranial abnormality.  Atrophy and chronic small-vessel white matter ischemic changes.  Acute on chronic sinus disease/ sinusitis.   Electronically Signed   By: Margarette Canada M.D.   On: 11/18/2014 15:27   Dg Hip Unilat With Pelvis 2-3 Views Right  11/18/2014   CLINICAL DATA:  Fall home today with right hip pain.  EXAM: RIGHT HIP (WITH PELVIS) 2-3 VIEWS  COMPARISON:  10/28/2013  FINDINGS: Examination demonstrates mild diffuse decreased bone mineralization. There are symmetric mild degenerative changes of the hips. There is a mildly displaced fracture focally involving the right greater trochanter. No definite evidence of extension of this fracture through the trochanteric region. Remainder the exam is unchanged.  IMPRESSION: Focal minimally displaced fracture of the right greater trochanter. No definite intertrochanteric extension.   Electronically Signed   By: Marin Olp M.D.   On: 11/18/2014 15:24    Microbiology: No results found for this or any previous visit (from the past 240 hour(s)).   Labs: Basic Metabolic Panel:  Recent Labs Lab 11/18/14 1505 11/19/14 0530 11/20/14 0517 11/21/14 0535  NA 136 136 137 136  K 4.3  4.5 3.7 3.8  CL 102 103 100* 103  CO2 19* 21* 22 21*  GLUCOSE 90 85 81 85  BUN 8 8 6 9   CREATININE 0.72 0.82 0.78 0.87  CALCIUM 8.9 8.8* 9.7 9.0   Liver Function Tests:  Recent  Labs Lab 11/18/14 1505  AST 80*  ALT 40  ALKPHOS 96  BILITOT 0.4  PROT 7.5  ALBUMIN 3.9   No results for input(s): LIPASE, AMYLASE in the last 168 hours. No results for input(s): AMMONIA in the last 168 hours. CBC:  Recent Labs Lab 11/18/14 1505  WBC 3.5*  NEUTROABS 2.5  HGB 8.3*  HCT 25.5*  MCV 81.5  PLT 186   Cardiac Enzymes:  Recent Labs Lab 11/18/14 1757 11/18/14 2325 11/19/14 0530  TROPONINI <0.03 <0.03 <0.03   BNP: BNP (last 3 results) No results for input(s): BNP in the last 8760 hours.  ProBNP (last 3 results)  Recent Labs  05/28/14 0500  PROBNP 6527.0*    CBG: No results for input(s): GLUCAP in the last 168 hours.     SignedLouellen Molder  [facility] 11/23/2014, 9:47 AM

## 2014-11-23 NOTE — Care Management Note (Signed)
Case Management Note  Patient Details  Name: Peter [name] MRN: [id number] Date of Birth: April 25, 1951  Subjective/Objective:  Discharge to SNF today.                  Action/Plan:   Expected Discharge Date:                  Expected Discharge Plan:  [place] ([place]  In-House Referral:  Clinical Social Work  Discharge planning Services  CM Consult  Post Acute Care Choice:    Choice offered to:     DME Arranged:    DME Agency:     HH Arranged:    [place] Agency:     Status of Service:  Completed, signed off  Medicare Important Message Given:    Date Medicare IM Given:  11/20/14 Medicare IM give by:  Dessa Phi Date Additional Medicare IM Given:  11/23/14 Additional Medicare Important Message give by:  Dessa Phi  If discussed at Long Length of Stay Meetings, dates discussed:    Additional Comments:  Dessa Phi, RN 11/23/2014, 9:30 AM

## 2014-11-27 DIAGNOSIS — G3184 Mild cognitive impairment, so stated: Secondary | ICD-10-CM | POA: Diagnosis not present

## 2014-11-27 DIAGNOSIS — M6281 Muscle weakness (generalized): Secondary | ICD-10-CM | POA: Diagnosis not present

## 2014-11-27 DIAGNOSIS — R5381 Other malaise: Secondary | ICD-10-CM | POA: Diagnosis not present

## 2014-11-27 DIAGNOSIS — R2681 Unsteadiness on feet: Secondary | ICD-10-CM | POA: Diagnosis not present

## 2014-11-30 DIAGNOSIS — R5381 Other malaise: Secondary | ICD-10-CM | POA: Diagnosis not present

## 2014-11-30 DIAGNOSIS — R2681 Unsteadiness on feet: Secondary | ICD-10-CM | POA: Diagnosis not present

## 2014-11-30 DIAGNOSIS — M6281 Muscle weakness (generalized): Secondary | ICD-10-CM | POA: Diagnosis not present

## 2014-11-30 DIAGNOSIS — G3184 Mild cognitive impairment, so stated: Secondary | ICD-10-CM | POA: Diagnosis not present

## 2014-12-05 DIAGNOSIS — G3184 Mild cognitive impairment, so stated: Secondary | ICD-10-CM | POA: Diagnosis not present

## 2014-12-05 DIAGNOSIS — R2681 Unsteadiness on feet: Secondary | ICD-10-CM | POA: Diagnosis not present

## 2014-12-05 DIAGNOSIS — R5381 Other malaise: Secondary | ICD-10-CM | POA: Diagnosis not present

## 2014-12-05 DIAGNOSIS — M6281 Muscle weakness (generalized): Secondary | ICD-10-CM | POA: Diagnosis not present

## 2014-12-07 DIAGNOSIS — G3184 Mild cognitive impairment, so stated: Secondary | ICD-10-CM | POA: Diagnosis not present

## 2014-12-07 DIAGNOSIS — M6281 Muscle weakness (generalized): Secondary | ICD-10-CM | POA: Diagnosis not present

## 2014-12-11 DIAGNOSIS — G3184 Mild cognitive impairment, so stated: Secondary | ICD-10-CM | POA: Diagnosis not present

## 2014-12-11 DIAGNOSIS — M6281 Muscle weakness (generalized): Secondary | ICD-10-CM | POA: Diagnosis not present

## 2014-12-12 DIAGNOSIS — J449 Chronic obstructive pulmonary disease, unspecified: Secondary | ICD-10-CM | POA: Diagnosis not present

## 2014-12-12 DIAGNOSIS — S72114A Nondisplaced fracture of greater trochanter of right femur, initial encounter for closed fracture: Secondary | ICD-10-CM | POA: Diagnosis not present

## 2014-12-12 DIAGNOSIS — M6281 Muscle weakness (generalized): Secondary | ICD-10-CM | POA: Diagnosis not present

## 2014-12-12 DIAGNOSIS — S72111S Displaced fracture of greater trochanter of right femur, sequela: Secondary | ICD-10-CM | POA: Diagnosis not present

## 2014-12-12 DIAGNOSIS — K219 Gastro-esophageal reflux disease without esophagitis: Secondary | ICD-10-CM | POA: Diagnosis not present

## 2014-12-18 DIAGNOSIS — F419 Anxiety disorder, unspecified: Secondary | ICD-10-CM | POA: Diagnosis not present

## 2014-12-18 DIAGNOSIS — I426 Alcoholic cardiomyopathy: Secondary | ICD-10-CM | POA: Diagnosis not present

## 2014-12-18 DIAGNOSIS — M6281 Muscle weakness (generalized): Secondary | ICD-10-CM | POA: Diagnosis not present

## 2014-12-18 DIAGNOSIS — D649 Anemia, unspecified: Secondary | ICD-10-CM | POA: Diagnosis not present

## 2014-12-18 DIAGNOSIS — Z9181 History of falling: Secondary | ICD-10-CM | POA: Diagnosis not present

## 2014-12-18 DIAGNOSIS — F339 Major depressive disorder, recurrent, unspecified: Secondary | ICD-10-CM | POA: Diagnosis not present

## 2014-12-18 DIAGNOSIS — F10229 Alcohol dependence with intoxication, unspecified: Secondary | ICD-10-CM | POA: Diagnosis not present

## 2014-12-18 DIAGNOSIS — J449 Chronic obstructive pulmonary disease, unspecified: Secondary | ICD-10-CM | POA: Diagnosis not present

## 2014-12-18 DIAGNOSIS — S72111D Displaced fracture of greater trochanter of right femur, subsequent encounter for closed fracture with routine healing: Secondary | ICD-10-CM | POA: Diagnosis not present

## 2014-12-18 DIAGNOSIS — R32 Unspecified urinary incontinence: Secondary | ICD-10-CM | POA: Diagnosis not present

## 2014-12-18 DIAGNOSIS — K7030 Alcoholic cirrhosis of liver without ascites: Secondary | ICD-10-CM | POA: Diagnosis not present

## 2014-12-18 DIAGNOSIS — H409 Unspecified glaucoma: Secondary | ICD-10-CM | POA: Diagnosis not present

## 2014-12-18 DIAGNOSIS — I951 Orthostatic hypotension: Secondary | ICD-10-CM | POA: Diagnosis not present

## 2014-12-18 DIAGNOSIS — I10 Essential (primary) hypertension: Secondary | ICD-10-CM | POA: Diagnosis not present

## 2014-12-18 DIAGNOSIS — Z8673 Personal history of transient ischemic attack (TIA), and cerebral infarction without residual deficits: Secondary | ICD-10-CM | POA: Diagnosis not present

## 2014-12-19 DIAGNOSIS — R32 Unspecified urinary incontinence: Secondary | ICD-10-CM | POA: Diagnosis not present

## 2014-12-19 DIAGNOSIS — S72111D Displaced fracture of greater trochanter of right femur, subsequent encounter for closed fracture with routine healing: Secondary | ICD-10-CM | POA: Diagnosis not present

## 2014-12-19 DIAGNOSIS — J449 Chronic obstructive pulmonary disease, unspecified: Secondary | ICD-10-CM | POA: Diagnosis not present

## 2014-12-19 DIAGNOSIS — I10 Essential (primary) hypertension: Secondary | ICD-10-CM | POA: Diagnosis not present

## 2014-12-19 DIAGNOSIS — D649 Anemia, unspecified: Secondary | ICD-10-CM | POA: Diagnosis not present

## 2014-12-19 DIAGNOSIS — K7030 Alcoholic cirrhosis of liver without ascites: Secondary | ICD-10-CM | POA: Diagnosis not present

## 2014-12-19 DIAGNOSIS — H538 Other visual disturbances: Secondary | ICD-10-CM | POA: Diagnosis not present

## 2014-12-19 DIAGNOSIS — H409 Unspecified glaucoma: Secondary | ICD-10-CM | POA: Diagnosis not present

## 2014-12-19 DIAGNOSIS — M6281 Muscle weakness (generalized): Secondary | ICD-10-CM | POA: Diagnosis not present

## 2014-12-19 DIAGNOSIS — F10229 Alcohol dependence with intoxication, unspecified: Secondary | ICD-10-CM | POA: Diagnosis not present

## 2014-12-19 DIAGNOSIS — Z9181 History of falling: Secondary | ICD-10-CM | POA: Diagnosis not present

## 2014-12-19 DIAGNOSIS — F339 Major depressive disorder, recurrent, unspecified: Secondary | ICD-10-CM | POA: Diagnosis not present

## 2014-12-19 DIAGNOSIS — H26493 Other secondary cataract, bilateral: Secondary | ICD-10-CM | POA: Diagnosis not present

## 2014-12-19 DIAGNOSIS — I426 Alcoholic cardiomyopathy: Secondary | ICD-10-CM | POA: Diagnosis not present

## 2014-12-19 DIAGNOSIS — F419 Anxiety disorder, unspecified: Secondary | ICD-10-CM | POA: Diagnosis not present

## 2014-12-19 DIAGNOSIS — Z8673 Personal history of transient ischemic attack (TIA), and cerebral infarction without residual deficits: Secondary | ICD-10-CM | POA: Diagnosis not present

## 2014-12-19 DIAGNOSIS — I951 Orthostatic hypotension: Secondary | ICD-10-CM | POA: Diagnosis not present

## 2014-12-20 DIAGNOSIS — R32 Unspecified urinary incontinence: Secondary | ICD-10-CM | POA: Diagnosis not present

## 2014-12-20 DIAGNOSIS — K7030 Alcoholic cirrhosis of liver without ascites: Secondary | ICD-10-CM | POA: Diagnosis not present

## 2014-12-20 DIAGNOSIS — I951 Orthostatic hypotension: Secondary | ICD-10-CM | POA: Diagnosis not present

## 2014-12-20 DIAGNOSIS — H409 Unspecified glaucoma: Secondary | ICD-10-CM | POA: Diagnosis not present

## 2014-12-20 DIAGNOSIS — D649 Anemia, unspecified: Secondary | ICD-10-CM | POA: Diagnosis not present

## 2014-12-20 DIAGNOSIS — Z9181 History of falling: Secondary | ICD-10-CM | POA: Diagnosis not present

## 2014-12-20 DIAGNOSIS — M6281 Muscle weakness (generalized): Secondary | ICD-10-CM | POA: Diagnosis not present

## 2014-12-20 DIAGNOSIS — F339 Major depressive disorder, recurrent, unspecified: Secondary | ICD-10-CM | POA: Diagnosis not present

## 2014-12-20 DIAGNOSIS — S72111D Displaced fracture of greater trochanter of right femur, subsequent encounter for closed fracture with routine healing: Secondary | ICD-10-CM | POA: Diagnosis not present

## 2014-12-20 DIAGNOSIS — F419 Anxiety disorder, unspecified: Secondary | ICD-10-CM | POA: Diagnosis not present

## 2014-12-20 DIAGNOSIS — F10229 Alcohol dependence with intoxication, unspecified: Secondary | ICD-10-CM | POA: Diagnosis not present

## 2014-12-20 DIAGNOSIS — J449 Chronic obstructive pulmonary disease, unspecified: Secondary | ICD-10-CM | POA: Diagnosis not present

## 2014-12-20 DIAGNOSIS — I426 Alcoholic cardiomyopathy: Secondary | ICD-10-CM | POA: Diagnosis not present

## 2014-12-20 DIAGNOSIS — Z8673 Personal history of transient ischemic attack (TIA), and cerebral infarction without residual deficits: Secondary | ICD-10-CM | POA: Diagnosis not present

## 2014-12-20 DIAGNOSIS — I10 Essential (primary) hypertension: Secondary | ICD-10-CM | POA: Diagnosis not present

## 2014-12-21 ENCOUNTER — Ambulatory Visit: Payer: Medicare Other | Attending: Internal Medicine | Admitting: Internal Medicine

## 2014-12-21 ENCOUNTER — Telehealth: Payer: Self-pay

## 2014-12-21 ENCOUNTER — Encounter: Payer: Self-pay | Admitting: Internal Medicine

## 2014-12-21 VITALS — BP 114/64 | HR 96 | Temp 98.0°F | Resp 16 | Wt 149.2 lb

## 2014-12-21 DIAGNOSIS — I10 Essential (primary) hypertension: Secondary | ICD-10-CM | POA: Insufficient documentation

## 2014-12-21 DIAGNOSIS — Z7951 Long term (current) use of inhaled steroids: Secondary | ICD-10-CM | POA: Diagnosis not present

## 2014-12-21 DIAGNOSIS — Z79899 Other long term (current) drug therapy: Secondary | ICD-10-CM | POA: Insufficient documentation

## 2014-12-21 DIAGNOSIS — E785 Hyperlipidemia, unspecified: Secondary | ICD-10-CM | POA: Diagnosis not present

## 2014-12-21 DIAGNOSIS — H409 Unspecified glaucoma: Secondary | ICD-10-CM | POA: Diagnosis not present

## 2014-12-21 DIAGNOSIS — Z8673 Personal history of transient ischemic attack (TIA), and cerebral infarction without residual deficits: Secondary | ICD-10-CM | POA: Insufficient documentation

## 2014-12-21 DIAGNOSIS — Z720 Tobacco use: Secondary | ICD-10-CM

## 2014-12-21 DIAGNOSIS — F1010 Alcohol abuse, uncomplicated: Secondary | ICD-10-CM

## 2014-12-21 DIAGNOSIS — F17210 Nicotine dependence, cigarettes, uncomplicated: Secondary | ICD-10-CM | POA: Insufficient documentation

## 2014-12-21 DIAGNOSIS — K7460 Unspecified cirrhosis of liver: Secondary | ICD-10-CM | POA: Insufficient documentation

## 2014-12-21 DIAGNOSIS — D509 Iron deficiency anemia, unspecified: Secondary | ICD-10-CM | POA: Diagnosis not present

## 2014-12-21 DIAGNOSIS — M84459D Pathological fracture, hip, unspecified, subsequent encounter for fracture with routine healing: Secondary | ICD-10-CM | POA: Diagnosis not present

## 2014-12-21 DIAGNOSIS — F329 Major depressive disorder, single episode, unspecified: Secondary | ICD-10-CM | POA: Insufficient documentation

## 2014-12-21 DIAGNOSIS — F17200 Nicotine dependence, unspecified, uncomplicated: Secondary | ICD-10-CM

## 2014-12-21 DIAGNOSIS — J449 Chronic obstructive pulmonary disease, unspecified: Secondary | ICD-10-CM | POA: Diagnosis not present

## 2014-12-21 DIAGNOSIS — S72114D Nondisplaced fracture of greater trochanter of right femur, subsequent encounter for closed fracture with routine healing: Secondary | ICD-10-CM

## 2014-12-21 LAB — COMPLETE METABOLIC PANEL WITH GFR
AST: 12 U/L (ref 0–37)
Albumin: 3.8 g/dL (ref 3.5–5.2)
Alkaline Phosphatase: 58 U/L (ref 39–117)
BUN: 7 mg/dL (ref 6–23)
CO2: 25 mEq/L (ref 19–32)
CREATININE: 0.94 mg/dL (ref 0.50–1.35)
Calcium: 9.3 mg/dL (ref 8.4–10.5)
Chloride: 102 mEq/L (ref 96–112)
GFR, Est Non African American: 87 mL/min
Glucose, Bld: 87 mg/dL (ref 70–99)
Potassium: 4.3 mEq/L (ref 3.5–5.3)
Sodium: 140 mEq/L (ref 135–145)
Total Bilirubin: 0.5 mg/dL (ref 0.2–1.2)
Total Protein: 7.1 g/dL (ref 6.0–8.3)

## 2014-12-21 MED ORDER — NICOTINE 21 MG/24HR TD PT24: 21.0000 mg | MEDICATED_PATCH | Freq: Every day | TRANSDERMAL | Status: DC

## 2014-12-21 NOTE — Telephone Encounter (Signed)
Spoke with diedra -physical therapist Needed a verbal order for physical therapist so that the patient can  Continue with their services

## 2014-12-21 NOTE — Progress Notes (Signed)
MRN: [id number] Name: Peter [name]  Sex: male Age: 64 y.o. DOB: 08/21/1950  Allergies: Aspirin and Poison ivy extract  Chief Complaint  Patient presents with  . Follow-up    HPI: Patient is 64 y.o. male who has history of alcohol abuse, COPD, recently hospitalized with syncope possible secondary to orthostatic hypotension which improved with IV hydration, EMR reviewed patient had echocardiogram done which showed EF of 50-55%, his Coreg dose was increased he was seen by physical therapy and after discharge patient was in SNF and then he was discharged home 2 weeks ago, for alcohol withdrawal he was put on Librium taper, was continued with thiamine folate and multivitamin, he was resumed back on trazodone, patient has history of stroke and is on statins. Patient also had) displaced fracture of greater trochanter of right femur and he was advised to follow with orthopedics. Patient currently denies drinking alcohol but still smokes cigarettes, counseled patient quit smoking.    Past Medical History  Diagnosis Date  . Hypertension   . Hyperlipemia   . Emphysema   . Stroke   . Chronic pain   . Cirrhosis of liver   . Fall   . Dental injury   . Bone spur of other site     Left Foot  . Impaired mobility   . Total self care deficit   . Pneumonia   . Emphysema   . ETOH abuse   . Elevated LFTs   . Arthritis   . Anxiety   . Sinusitis   . Anemia     iron def.  . Hard of hearing   . Diabetes mellitus without complication   . COPD (chronic obstructive pulmonary disease)   . Depression   . Glaucoma   . Emphysema of lung   . Cataract     Past Surgical History  Procedure Laterality Date  . Esophagogastroduodenoscopy  01/09/2012    Procedure: ESOPHAGOGASTRODUODENOSCOPY (EGD);  Surgeon: Beryle Beams, MD;  Location: [facility] ENDOSCOPY;  Service: Endoscopy;  Laterality: N/A;  . Multiple extractions with alveoloplasty  03/29/2012    Procedure: MULTIPLE EXTRACION WITH ALVEOLOPLASTY;   Surgeon: Gae Bon, DDS;  Location: [place];  Service: Oral Surgery;  Laterality: Bilateral;  . Bone spur  2013    left spur  . Cataract extraction w/ intraocular lens implant    . Mouth surgery    . Colonoscopy        Medication List       This list is accurate as of: 12/21/14 12:57 PM.  Always use your most recent med list.               acamprosate 333 MG tablet  Commonly known as:  CAMPRAL  Take 2 tablets (666 mg total) by mouth 3 (three) times daily with meals.     albuterol 108 (90 BASE) MCG/ACT inhaler  Commonly known as:  PROVENTIL HFA;VENTOLIN HFA  Inhale 2 puffs into the lungs 4 (four) times daily.     atorvastatin 80 MG tablet  Commonly known as:  LIPITOR  Take 1 tablet (80 mg total) by mouth daily.     brimonidine 0.2 % ophthalmic solution  Commonly known as:  ALPHAGAN  Place 1 drop into both eyes 2 (two) times daily.     budesonide-formoterol 80-4.5 MCG/ACT inhaler  Commonly known as:  SYMBICORT  Inhale 2 puffs into the lungs 2 (two) times daily.     carvedilol 6.25 MG tablet  Commonly known as:  COREG  Take 1 tablet (6.25 mg total) by mouth 2 (two) times daily with a meal.     folic acid 1 MG tablet  Commonly known as:  FOLVITE  Take 1 tablet (1 mg total) by mouth daily.     furosemide 40 MG tablet  Commonly known as:  LASIX  Take 1 tablet (40 mg total) by mouth daily.     ipratropium-albuterol 0.5-2.5 (3) MG/3ML Soln  Commonly known as:  DUONEB  Take 3 mLs by nebulization every 4 (four) hours as needed.     lisinopril 5 MG tablet  Commonly known as:  PRINIVIL,ZESTRIL  Take 1 tablet (5 mg total) by mouth daily.     multivitamin tablet  Take 1 tablet by mouth daily.     nicotine 21 mg/24hr patch  Commonly known as:  NICODERM CQ  Place 1 patch (21 mg total) onto the skin daily.     pantoprazole 40 MG tablet  Commonly known as:  PROTONIX  Take 1 tablet (40 mg total) by mouth daily.     potassium chloride SA 20 MEQ tablet  Commonly  known as:  K-DUR,KLOR-CON  Take 1 tablet (20 mEq total) by mouth daily.     thiamine 100 MG tablet  Commonly known as:  VITAMIN B-1  Take 1 tablet (100 mg total) by mouth daily. For vitamin B deficiency.     traZODone 50 MG tablet  Commonly known as:  DESYREL  Take 1 tablet (50 mg total) by mouth at bedtime.     vitamin B-12 100 MCG tablet  Commonly known as:  CYANOCOBALAMIN  Take 1 tablet (100 mcg total) by mouth daily. For low B12.     vitamin E 200 UNIT capsule  Take 1 capsule (200 Units total) by mouth daily.        Meds ordered this encounter  Medications  . nicotine (NICODERM CQ) 21 mg/24hr patch    Sig: Place 1 patch (21 mg total) onto the skin daily.    Dispense:  28 patch    Refill:  0    Immunization History  Administered Date(s) Administered  . Influenza Split 06/07/2013  . Influenza,inj,Quad PF,36+ Mos 05/26/2014  . Pneumococcal Polysaccharide-23 01/04/2012, 05/26/2014    Family History  Problem Relation Age of Onset  . Breast cancer Mother   . Diabetes Maternal Aunt   . Heart disease Sister   . Heart disease Maternal Aunt     History  Substance Use Topics  . Smoking status: Current Every Day Smoker -- 1.00 packs/day for 30 years    Types: Cigarettes  . Smokeless tobacco: Never Used  . Alcohol Use: 2.4 oz/week    4 Cans of beer per week     Comment: per pt  2 40oz daily beer. Last drink: 2 hrs ago    Review of Systems   As noted in HPI  Filed Vitals:   12/21/14 1107  BP: 114/64  Pulse: 96  Temp: 98 F (36.7 C)  Resp: 16    Physical Exam  Physical Exam  Constitutional: No distress.  Eyes: EOM are normal. Pupils are equal, round, and reactive to light.  Cardiovascular: Normal rate and regular rhythm.   Pulmonary/Chest: Breath sounds normal. No respiratory distress. He has no wheezes. He has no rales.  Abdominal: Soft. Bowel sounds are normal. There is no tenderness. There is no rebound.  Musculoskeletal: He exhibits no edema.     CBC    Component Value Date/Time  WBC 3.5* 11/18/2014 1505   RBC 3.12* 11/18/2014 1757   RBC 3.13* 11/18/2014 1505   HGB 8.3* 11/18/2014 1505   HCT 25.5* 11/18/2014 1505   PLT 186 11/18/2014 1505   MCV 81.5 11/18/2014 1505   LYMPHSABS 0.7 11/18/2014 1505   MONOABS 0.2 11/18/2014 1505   EOSABS 0.1 11/18/2014 1505   BASOSABS 0.1 11/18/2014 1505    CMP     Component Value Date/Time   NA 136 11/21/2014 0535   K 3.8 11/21/2014 0535   CL 103 11/21/2014 0535   CO2 21* 11/21/2014 0535   GLUCOSE 85 11/21/2014 0535   BUN 9 11/21/2014 0535   CREATININE 0.87 11/21/2014 0535   CREATININE 0.86 07/17/2014 1542   CALCIUM 9.0 11/21/2014 0535   PROT 7.5 11/18/2014 1505   ALBUMIN 3.9 11/18/2014 1505   AST 80* 11/18/2014 1505   ALT 40 11/18/2014 1505   ALKPHOS 96 11/18/2014 1505   BILITOT 0.4 11/18/2014 1505   GFRNONAA >60 11/21/2014 0535   GFRNONAA >89 07/17/2014 1542   GFRAA >60 11/21/2014 0535   GFRAA >89 07/17/2014 1542    No results found for: CHOL  Lab Results  Component Value Date/Time   HGBA1C 4.4 12/15/2013 10:08 AM   HGBA1C 5.4 01/03/2012 05:27 PM    Lab Results  Component Value Date/Time   AST 80* 11/18/2014 03:05 PM    Assessment and Plan  Essential hypertension - Plan: COMPLETE METABOLIC PANEL WITH GFR  COPD, moderate Continue with Symbicort, albuterol when necessary  Chronic alcohol abuse As per patient he has stopped drinking alcohol.  Smoker - Plan: nicotine (NICODERM CQ) 21 mg/24hr patch  Closed nondisplaced fracture of greater trochanter of right femur with routine healing Patient follow with orthopedics.  Return in about 3 months (around 03/23/2015), or if symptoms worsen or fail to improve.   This note has been created with Surveyor, quantity. Any transcriptional errors are unintentional.    Lorayne Marek, MD

## 2014-12-21 NOTE — Progress Notes (Signed)
Patient here for follow up from the hospital' Patient has a history of alcohol abuse and COPD Also here for follow up on his blood pressure

## 2014-12-22 ENCOUNTER — Telehealth: Payer: Self-pay

## 2014-12-22 ENCOUNTER — Telehealth: Payer: Self-pay | Admitting: Internal Medicine

## 2014-12-22 DIAGNOSIS — S72111D Displaced fracture of greater trochanter of right femur, subsequent encounter for closed fracture with routine healing: Secondary | ICD-10-CM | POA: Diagnosis not present

## 2014-12-22 DIAGNOSIS — I951 Orthostatic hypotension: Secondary | ICD-10-CM | POA: Diagnosis not present

## 2014-12-22 DIAGNOSIS — I426 Alcoholic cardiomyopathy: Secondary | ICD-10-CM | POA: Diagnosis not present

## 2014-12-22 DIAGNOSIS — Z9181 History of falling: Secondary | ICD-10-CM | POA: Diagnosis not present

## 2014-12-22 DIAGNOSIS — F339 Major depressive disorder, recurrent, unspecified: Secondary | ICD-10-CM | POA: Diagnosis not present

## 2014-12-22 DIAGNOSIS — Z8673 Personal history of transient ischemic attack (TIA), and cerebral infarction without residual deficits: Secondary | ICD-10-CM | POA: Diagnosis not present

## 2014-12-22 DIAGNOSIS — K7030 Alcoholic cirrhosis of liver without ascites: Secondary | ICD-10-CM | POA: Diagnosis not present

## 2014-12-22 DIAGNOSIS — F10229 Alcohol dependence with intoxication, unspecified: Secondary | ICD-10-CM | POA: Diagnosis not present

## 2014-12-22 DIAGNOSIS — I10 Essential (primary) hypertension: Secondary | ICD-10-CM | POA: Diagnosis not present

## 2014-12-22 DIAGNOSIS — F419 Anxiety disorder, unspecified: Secondary | ICD-10-CM | POA: Diagnosis not present

## 2014-12-22 DIAGNOSIS — M6281 Muscle weakness (generalized): Secondary | ICD-10-CM | POA: Diagnosis not present

## 2014-12-22 DIAGNOSIS — H409 Unspecified glaucoma: Secondary | ICD-10-CM | POA: Diagnosis not present

## 2014-12-22 DIAGNOSIS — R32 Unspecified urinary incontinence: Secondary | ICD-10-CM | POA: Diagnosis not present

## 2014-12-22 DIAGNOSIS — J449 Chronic obstructive pulmonary disease, unspecified: Secondary | ICD-10-CM | POA: Diagnosis not present

## 2014-12-22 DIAGNOSIS — D649 Anemia, unspecified: Secondary | ICD-10-CM | POA: Diagnosis not present

## 2014-12-22 NOTE — Telephone Encounter (Signed)
-----   Message from Lorayne Marek, MD sent at 12/22/2014 11:41 AM EDT ----- Call and let the Patient know that blood work is normal.

## 2014-12-22 NOTE — Telephone Encounter (Signed)
Returning nurses' call

## 2014-12-22 NOTE — Telephone Encounter (Signed)
Patient not available Left message with family member to have him return our call 

## 2014-12-22 NOTE — Telephone Encounter (Signed)
[facility] calling for orders for home health occupational health therapy.

## 2014-12-25 ENCOUNTER — Telehealth: Payer: Self-pay | Admitting: Internal Medicine

## 2014-12-25 DIAGNOSIS — S72111D Displaced fracture of greater trochanter of right femur, subsequent encounter for closed fracture with routine healing: Secondary | ICD-10-CM | POA: Diagnosis not present

## 2014-12-25 DIAGNOSIS — D649 Anemia, unspecified: Secondary | ICD-10-CM | POA: Diagnosis not present

## 2014-12-25 DIAGNOSIS — I10 Essential (primary) hypertension: Secondary | ICD-10-CM | POA: Diagnosis not present

## 2014-12-25 DIAGNOSIS — F419 Anxiety disorder, unspecified: Secondary | ICD-10-CM | POA: Diagnosis not present

## 2014-12-25 DIAGNOSIS — K7030 Alcoholic cirrhosis of liver without ascites: Secondary | ICD-10-CM | POA: Diagnosis not present

## 2014-12-25 DIAGNOSIS — F339 Major depressive disorder, recurrent, unspecified: Secondary | ICD-10-CM | POA: Diagnosis not present

## 2014-12-25 DIAGNOSIS — F10229 Alcohol dependence with intoxication, unspecified: Secondary | ICD-10-CM | POA: Diagnosis not present

## 2014-12-25 DIAGNOSIS — I426 Alcoholic cardiomyopathy: Secondary | ICD-10-CM | POA: Diagnosis not present

## 2014-12-25 DIAGNOSIS — Z9181 History of falling: Secondary | ICD-10-CM | POA: Diagnosis not present

## 2014-12-25 DIAGNOSIS — M6281 Muscle weakness (generalized): Secondary | ICD-10-CM | POA: Diagnosis not present

## 2014-12-25 DIAGNOSIS — H409 Unspecified glaucoma: Secondary | ICD-10-CM | POA: Diagnosis not present

## 2014-12-25 DIAGNOSIS — J449 Chronic obstructive pulmonary disease, unspecified: Secondary | ICD-10-CM | POA: Diagnosis not present

## 2014-12-25 DIAGNOSIS — R32 Unspecified urinary incontinence: Secondary | ICD-10-CM | POA: Diagnosis not present

## 2014-12-25 DIAGNOSIS — I951 Orthostatic hypotension: Secondary | ICD-10-CM | POA: Diagnosis not present

## 2014-12-25 DIAGNOSIS — Z8673 Personal history of transient ischemic attack (TIA), and cerebral infarction without residual deficits: Secondary | ICD-10-CM | POA: Diagnosis not present

## 2014-12-25 NOTE — Telephone Encounter (Signed)
Deedra from [facility] calling to report patients bp readings and potential drug interactions.   Pt's sitting bp: 2/58, standing: 70/50.   Pt has been using inhaler and when inhaler (SYMBICORT) was added to patient's med list there was an alert for major interactions and several moderate interactions with other medications on med list.  Please f/u with Deedra with more information/instructions.

## 2014-12-26 DIAGNOSIS — F10229 Alcohol dependence with intoxication, unspecified: Secondary | ICD-10-CM | POA: Diagnosis not present

## 2014-12-26 DIAGNOSIS — I10 Essential (primary) hypertension: Secondary | ICD-10-CM | POA: Diagnosis not present

## 2014-12-26 DIAGNOSIS — M6281 Muscle weakness (generalized): Secondary | ICD-10-CM | POA: Diagnosis not present

## 2014-12-26 DIAGNOSIS — J449 Chronic obstructive pulmonary disease, unspecified: Secondary | ICD-10-CM | POA: Diagnosis not present

## 2014-12-26 DIAGNOSIS — I951 Orthostatic hypotension: Secondary | ICD-10-CM | POA: Diagnosis not present

## 2014-12-26 DIAGNOSIS — D649 Anemia, unspecified: Secondary | ICD-10-CM | POA: Diagnosis not present

## 2014-12-26 DIAGNOSIS — K7030 Alcoholic cirrhosis of liver without ascites: Secondary | ICD-10-CM | POA: Diagnosis not present

## 2014-12-26 DIAGNOSIS — Z9181 History of falling: Secondary | ICD-10-CM | POA: Diagnosis not present

## 2014-12-26 DIAGNOSIS — Z8673 Personal history of transient ischemic attack (TIA), and cerebral infarction without residual deficits: Secondary | ICD-10-CM | POA: Diagnosis not present

## 2014-12-26 DIAGNOSIS — H409 Unspecified glaucoma: Secondary | ICD-10-CM | POA: Diagnosis not present

## 2014-12-26 DIAGNOSIS — R32 Unspecified urinary incontinence: Secondary | ICD-10-CM | POA: Diagnosis not present

## 2014-12-26 DIAGNOSIS — F419 Anxiety disorder, unspecified: Secondary | ICD-10-CM | POA: Diagnosis not present

## 2014-12-26 DIAGNOSIS — I426 Alcoholic cardiomyopathy: Secondary | ICD-10-CM | POA: Diagnosis not present

## 2014-12-26 DIAGNOSIS — F339 Major depressive disorder, recurrent, unspecified: Secondary | ICD-10-CM | POA: Diagnosis not present

## 2014-12-26 DIAGNOSIS — S72111D Displaced fracture of greater trochanter of right femur, subsequent encounter for closed fracture with routine healing: Secondary | ICD-10-CM | POA: Diagnosis not present

## 2014-12-27 DIAGNOSIS — D649 Anemia, unspecified: Secondary | ICD-10-CM | POA: Diagnosis not present

## 2014-12-27 DIAGNOSIS — I426 Alcoholic cardiomyopathy: Secondary | ICD-10-CM | POA: Diagnosis not present

## 2014-12-27 DIAGNOSIS — Z8673 Personal history of transient ischemic attack (TIA), and cerebral infarction without residual deficits: Secondary | ICD-10-CM | POA: Diagnosis not present

## 2014-12-27 DIAGNOSIS — J449 Chronic obstructive pulmonary disease, unspecified: Secondary | ICD-10-CM | POA: Diagnosis not present

## 2014-12-27 DIAGNOSIS — F419 Anxiety disorder, unspecified: Secondary | ICD-10-CM | POA: Diagnosis not present

## 2014-12-27 DIAGNOSIS — M6281 Muscle weakness (generalized): Secondary | ICD-10-CM | POA: Diagnosis not present

## 2014-12-27 DIAGNOSIS — S72111D Displaced fracture of greater trochanter of right femur, subsequent encounter for closed fracture with routine healing: Secondary | ICD-10-CM | POA: Diagnosis not present

## 2014-12-27 DIAGNOSIS — K7030 Alcoholic cirrhosis of liver without ascites: Secondary | ICD-10-CM | POA: Diagnosis not present

## 2014-12-27 DIAGNOSIS — F339 Major depressive disorder, recurrent, unspecified: Secondary | ICD-10-CM | POA: Diagnosis not present

## 2014-12-27 DIAGNOSIS — F10229 Alcohol dependence with intoxication, unspecified: Secondary | ICD-10-CM | POA: Diagnosis not present

## 2014-12-27 DIAGNOSIS — Z9181 History of falling: Secondary | ICD-10-CM | POA: Diagnosis not present

## 2014-12-27 DIAGNOSIS — H409 Unspecified glaucoma: Secondary | ICD-10-CM | POA: Diagnosis not present

## 2014-12-27 DIAGNOSIS — I951 Orthostatic hypotension: Secondary | ICD-10-CM | POA: Diagnosis not present

## 2014-12-27 DIAGNOSIS — I10 Essential (primary) hypertension: Secondary | ICD-10-CM | POA: Diagnosis not present

## 2014-12-27 DIAGNOSIS — R32 Unspecified urinary incontinence: Secondary | ICD-10-CM | POA: Diagnosis not present

## 2014-12-28 ENCOUNTER — Ambulatory Visit (INDEPENDENT_AMBULATORY_CARE_PROVIDER_SITE_OTHER): Payer: Medicare Other | Admitting: Podiatry

## 2014-12-28 ENCOUNTER — Encounter: Payer: Self-pay | Admitting: Podiatry

## 2014-12-28 VITALS — BP 102/60 | HR 78 | Resp 15

## 2014-12-28 DIAGNOSIS — M79673 Pain in unspecified foot: Secondary | ICD-10-CM | POA: Diagnosis not present

## 2014-12-28 DIAGNOSIS — B078 Other viral warts: Secondary | ICD-10-CM

## 2014-12-28 DIAGNOSIS — Q667 Congenital pes cavus: Secondary | ICD-10-CM

## 2014-12-28 DIAGNOSIS — B079 Viral wart, unspecified: Secondary | ICD-10-CM

## 2014-12-28 DIAGNOSIS — M216X9 Other acquired deformities of unspecified foot: Secondary | ICD-10-CM

## 2014-12-28 NOTE — Progress Notes (Signed)
   Subjective:    Patient ID: Peter [name], male    DOB: [birth year], 64 y.o.   MRN: [id number]  HPI  Pt presents with painful callus/wart on left foot, b/l foot pain with swelling   Review of Systems  HENT: Positive for rhinorrhea and sinus pressure.   Respiratory: Positive for shortness of breath.   Endocrine: Positive for polydipsia and polyuria.  Genitourinary: Positive for frequency.  Musculoskeletal: Positive for myalgias, back pain, arthralgias and gait problem.  Hematological: Bruises/bleeds easily.  All other systems reviewed and are negative.      Objective:   Physical Exam        Assessment & Plan:

## 2014-12-29 ENCOUNTER — Telehealth: Payer: Self-pay | Admitting: Internal Medicine

## 2014-12-29 ENCOUNTER — Encounter (HOSPITAL_COMMUNITY): Payer: Self-pay | Admitting: Emergency Medicine

## 2014-12-29 ENCOUNTER — Emergency Department (HOSPITAL_COMMUNITY)
Admission: EM | Admit: 2014-12-29 | Discharge: 2014-12-29 | Disposition: A | Discharge status: Home / Self Care | Payer: Medicare Other | Attending: Emergency Medicine | Admitting: Emergency Medicine

## 2014-12-29 ENCOUNTER — Emergency Department (HOSPITAL_COMMUNITY): Payer: Medicare Other

## 2014-12-29 DIAGNOSIS — M6281 Muscle weakness (generalized): Secondary | ICD-10-CM | POA: Diagnosis not present

## 2014-12-29 DIAGNOSIS — R32 Unspecified urinary incontinence: Secondary | ICD-10-CM | POA: Diagnosis not present

## 2014-12-29 DIAGNOSIS — H409 Unspecified glaucoma: Secondary | ICD-10-CM | POA: Insufficient documentation

## 2014-12-29 DIAGNOSIS — Z7951 Long term (current) use of inhaled steroids: Secondary | ICD-10-CM | POA: Insufficient documentation

## 2014-12-29 DIAGNOSIS — J449 Chronic obstructive pulmonary disease, unspecified: Secondary | ICD-10-CM | POA: Insufficient documentation

## 2014-12-29 DIAGNOSIS — Z8701 Personal history of pneumonia (recurrent): Secondary | ICD-10-CM | POA: Diagnosis not present

## 2014-12-29 DIAGNOSIS — H9190 Unspecified hearing loss, unspecified ear: Secondary | ICD-10-CM | POA: Diagnosis not present

## 2014-12-29 DIAGNOSIS — M1990 Unspecified osteoarthritis, unspecified site: Secondary | ICD-10-CM | POA: Diagnosis not present

## 2014-12-29 DIAGNOSIS — I959 Hypotension, unspecified: Secondary | ICD-10-CM | POA: Diagnosis not present

## 2014-12-29 DIAGNOSIS — K7030 Alcoholic cirrhosis of liver without ascites: Secondary | ICD-10-CM | POA: Diagnosis not present

## 2014-12-29 DIAGNOSIS — Z8719 Personal history of other diseases of the digestive system: Secondary | ICD-10-CM | POA: Insufficient documentation

## 2014-12-29 DIAGNOSIS — Z8673 Personal history of transient ischemic attack (TIA), and cerebral infarction without residual deficits: Secondary | ICD-10-CM | POA: Diagnosis not present

## 2014-12-29 DIAGNOSIS — Z720 Tobacco use: Secondary | ICD-10-CM | POA: Diagnosis not present

## 2014-12-29 DIAGNOSIS — F10229 Alcohol dependence with intoxication, unspecified: Secondary | ICD-10-CM | POA: Diagnosis not present

## 2014-12-29 DIAGNOSIS — I951 Orthostatic hypotension: Secondary | ICD-10-CM | POA: Diagnosis not present

## 2014-12-29 DIAGNOSIS — D649 Anemia, unspecified: Secondary | ICD-10-CM | POA: Insufficient documentation

## 2014-12-29 DIAGNOSIS — F339 Major depressive disorder, recurrent, unspecified: Secondary | ICD-10-CM | POA: Diagnosis not present

## 2014-12-29 DIAGNOSIS — R42 Dizziness and giddiness: Secondary | ICD-10-CM | POA: Insufficient documentation

## 2014-12-29 DIAGNOSIS — S72111D Displaced fracture of greater trochanter of right femur, subsequent encounter for closed fracture with routine healing: Secondary | ICD-10-CM | POA: Diagnosis not present

## 2014-12-29 DIAGNOSIS — E119 Type 2 diabetes mellitus without complications: Secondary | ICD-10-CM | POA: Diagnosis not present

## 2014-12-29 DIAGNOSIS — E785 Hyperlipidemia, unspecified: Secondary | ICD-10-CM | POA: Insufficient documentation

## 2014-12-29 DIAGNOSIS — Z9181 History of falling: Secondary | ICD-10-CM | POA: Diagnosis not present

## 2014-12-29 DIAGNOSIS — I10 Essential (primary) hypertension: Secondary | ICD-10-CM | POA: Diagnosis not present

## 2014-12-29 DIAGNOSIS — G8929 Other chronic pain: Secondary | ICD-10-CM | POA: Insufficient documentation

## 2014-12-29 DIAGNOSIS — F419 Anxiety disorder, unspecified: Secondary | ICD-10-CM | POA: Diagnosis not present

## 2014-12-29 DIAGNOSIS — Z79899 Other long term (current) drug therapy: Secondary | ICD-10-CM | POA: Insufficient documentation

## 2014-12-29 DIAGNOSIS — I426 Alcoholic cardiomyopathy: Secondary | ICD-10-CM | POA: Diagnosis not present

## 2014-12-29 LAB — BASIC METABOLIC PANEL
Anion gap: 11 (ref 5–15)
BUN: 10 mg/dL (ref 6–20)
CO2: 25 mmol/L (ref 22–32)
CREATININE: 1.41 mg/dL — AB (ref 0.61–1.24)
Calcium: 9.4 mg/dL (ref 8.9–10.3)
Chloride: 105 mmol/L (ref 101–111)
GFR, EST NON AFRICAN AMERICAN: 52 mL/min — AB (ref >60)
GLUCOSE: 109 mg/dL — AB (ref 65–99)
Potassium: 3.9 mmol/L (ref 3.5–5.1)
Sodium: 141 mmol/L (ref 135–145)

## 2014-12-29 LAB — CBC
HCT: 27.9 % — ABNORMAL LOW (ref 39.0–52.0)
Hemoglobin: 8.9 g/dL — ABNORMAL LOW (ref 13.0–17.0)
MCH: 25.6 pg — AB (ref 26.0–34.0)
MCHC: 31.9 g/dL (ref 30.0–36.0)
MCV: 80.2 fL (ref 78.0–100.0)
Platelets: 380 10*3/uL (ref 150–400)
RBC: 3.48 MIL/uL — ABNORMAL LOW (ref 4.22–5.81)
RDW: 15.3 % (ref 11.5–15.5)
WBC: 5.2 10*3/uL (ref 4.0–10.5)

## 2014-12-29 LAB — CBG MONITORING, ED: Glucose-Capillary: 101 mg/dL — ABNORMAL HIGH (ref 65–99)

## 2014-12-29 LAB — I-STAT CG4 LACTIC ACID, ED: Lactic Acid, Venous: 1.54 mmol/L (ref 0.5–2.0)

## 2014-12-29 MED ORDER — SODIUM CHLORIDE 0.9 % IV BOLUS (SEPSIS)
1000.0000 mL | Freq: Once | INTRAVENOUS | Status: AC
  Administered 2014-12-29: 1000 mL via INTRAVENOUS

## 2014-12-29 NOTE — Discharge Instructions (Signed)

## 2014-12-29 NOTE — ED Notes (Signed)
Pt complaint of dizziness and hypotension for 2 weeks; told to come here for evaluation per at home nurse.

## 2014-12-29 NOTE — Telephone Encounter (Signed)
Physical therapist called requesting to speak to PCP regarding patient's blood pressure, dropping when patient stands up, reading have been  96/58 64/50 and patient is having dizziness .Please f/u

## 2014-12-29 NOTE — Telephone Encounter (Signed)
Nurse called patient. Patient explained to nurse he is on his way to the hospital because his blood pressure keeps dropping.

## 2014-12-29 NOTE — ED Provider Notes (Signed)
CSN: [id number]     Arrival date & time 12/29/14  1514 History   First MD Initiated Contact with Patient 12/29/14 1747     Chief Complaint  Patient presents with  . Dizziness  . Hypotension     (Consider location/radiation/quality/duration/timing/severity/associated sxs/prior Treatment) Patient is a 64 y.o. male presenting with dizziness. The history is provided by the patient.  Dizziness Quality:  Lightheadedness Severity:  Moderate Onset quality:  Gradual Duration:  2 weeks Timing:  Constant Progression:  Unchanged Chronicity:  New Context: standing up   Relieved by:  Nothing Worsened by:  Nothing Associated symptoms: no shortness of breath and no vomiting     Past Medical History  Diagnosis Date  . Hypertension   . Hyperlipemia   . Emphysema   . Stroke   . Chronic pain   . Cirrhosis of liver   . Fall   . Dental injury   . Bone spur of other site     Left Foot  . Impaired mobility   . Total self care deficit   . Pneumonia   . Emphysema   . ETOH abuse   . Elevated LFTs   . Arthritis   . Anxiety   . Sinusitis   . Anemia     iron def.  . Hard of hearing   . Diabetes mellitus without complication   . COPD (chronic obstructive pulmonary disease)   . Depression   . Glaucoma   . Emphysema of lung   . Cataract    Past Surgical History  Procedure Laterality Date  . Esophagogastroduodenoscopy  01/09/2012    Procedure: ESOPHAGOGASTRODUODENOSCOPY (EGD);  Surgeon: Beryle Beams, MD;  Location: [facility] ENDOSCOPY;  Service: Endoscopy;  Laterality: N/A;  . Multiple extractions with alveoloplasty  03/29/2012    Procedure: MULTIPLE EXTRACION WITH ALVEOLOPLASTY;  Surgeon: Gae Bon, DDS;  Location: [place];  Service: Oral Surgery;  Laterality: Bilateral;  . Bone spur  2013    left spur  . Cataract extraction w/ intraocular lens implant    . Mouth surgery    . Colonoscopy     Family History  Problem Relation Age of Onset  . Breast cancer Mother   . Diabetes Maternal  Aunt   . Heart disease Sister   . Heart disease Maternal Aunt    History  Substance Use Topics  . Smoking status: Current Every Day Smoker -- 1.00 packs/day for 30 years    Types: Cigarettes  . Smokeless tobacco: Never Used  . Alcohol Use: 2.4 oz/week    4 Cans of beer per week     Comment: per pt  2 40oz daily beer. Last drink: 2 hrs ago    Review of Systems  Constitutional: Negative for fever.  Respiratory: Negative for cough and shortness of breath.   Gastrointestinal: Negative for vomiting and abdominal pain.  Neurological: Positive for dizziness.  All other systems reviewed and are negative.     Allergies  Aspirin and Poison ivy extract  Home Medications   Prior to Admission medications   Medication Sig Start Date End Date Taking? Authorizing Provider  acamprosate (CAMPRAL) 333 MG tablet Take 2 tablets (666 mg total) by mouth 3 (three) times daily with meals. 07/17/14  Yes Lorayne Marek, MD  albuterol (PROVENTIL HFA;VENTOLIN HFA) 108 (90 BASE) MCG/ACT inhaler Inhale 2 puffs into the lungs 4 (four) times daily. 07/17/14  Yes Lorayne Marek, MD  atorvastatin (LIPITOR) 80 MG tablet Take 1 tablet (80 mg total) by mouth  daily. 07/17/14  Yes Lorayne Marek, MD  brimonidine (ALPHAGAN) 0.2 % ophthalmic solution Place 1 drop into both eyes 2 (two) times daily. 12/15/13  Yes Lorayne Marek, MD  budesonide-formoterol (SYMBICORT) 80-4.5 MCG/ACT inhaler Inhale 2 puffs into the lungs 2 (two) times daily. 07/17/14  Yes Lorayne Marek, MD  carvedilol (COREG) 6.25 MG tablet Take 1 tablet (6.25 mg total) by mouth 2 (two) times daily with a meal. 11/23/14  Yes Nishant Dhungel, MD  furosemide (LASIX) 40 MG tablet Take 1 tablet (40 mg total) by mouth daily. 07/17/14  Yes Lorayne Marek, MD  gabapentin (NEURONTIN) 300 MG capsule Take 300 mg by mouth 3 (three) times daily as needed (pain).   Yes Historical Provider, MD  ipratropium-albuterol (DUONEB) 0.5-2.5 (3) MG/3ML SOLN Take 3 mLs by nebulization every  4 (four) hours as needed. 05/28/14  Yes Theodis Blaze, MD  lisinopril (PRINIVIL,ZESTRIL) 5 MG tablet Take 1 tablet (5 mg total) by mouth daily. 11/23/14  Yes Nishant Dhungel, MD  pantoprazole (PROTONIX) 40 MG tablet Take 1 tablet (40 mg total) by mouth daily. 07/17/14  Yes Lorayne Marek, MD  tamsulosin (FLOMAX) 0.4 MG CAPS capsule Take 0.4 mg by mouth at bedtime.   Yes Historical Provider, MD  traZODone (DESYREL) 50 MG tablet Take 1 tablet (50 mg total) by mouth at bedtime. 07/17/14  Yes Lorayne Marek, MD  folic acid (FOLVITE) 1 MG tablet Take 1 tablet (1 mg total) by mouth daily. Patient not taking: Reported on 12/29/2014 07/17/14   Lorayne Marek, MD  Multiple Vitamin (MULTIVITAMIN) tablet Take 1 tablet by mouth daily. Patient not taking: Reported on 12/29/2014 02/16/13   Tresa Garter, MD  nicotine (NICODERM CQ) 21 mg/24hr patch Place 1 patch (21 mg total) onto the skin daily. Patient not taking: Reported on 12/29/2014 12/21/14   Lorayne Marek, MD  potassium chloride SA (K-DUR,KLOR-CON) 20 MEQ tablet Take 1 tablet (20 mEq total) by mouth daily. Patient not taking: Reported on 12/29/2014 07/17/14   Lorayne Marek, MD  thiamine (VITAMIN B-1) 100 MG tablet Take 1 tablet (100 mg total) by mouth daily. For vitamin B deficiency. Patient not taking: Reported on 12/29/2014 12/15/13   Lorayne Marek, MD  vitamin B-12 (CYANOCOBALAMIN) 100 MCG tablet Take 1 tablet (100 mcg total) by mouth daily. For low B12. Patient not taking: Reported on 12/29/2014 12/15/13   Lorayne Marek, MD  vitamin E 200 UNIT capsule Take 1 capsule (200 Units total) by mouth daily. 02/16/13   Tresa Garter, MD   BP 97/58 mmHg  Pulse 85  Temp(Src) 98.2 F (36.8 C) (Oral)  Resp 16  Ht 5' 7.5" (1.715 m)  Wt 153 lb (69.4 kg)  BMI 23.60 kg/m2  SpO2 100% Physical Exam  Constitutional: He is oriented to person, place, and time. He appears well-developed and well-nourished. No distress.  HENT:  Head: Normocephalic and atraumatic.   Mouth/Throat: Oropharynx is clear and moist. No oropharyngeal exudate.  Eyes: EOM are normal. Pupils are equal, round, and reactive to light.  Neck: Normal range of motion. Neck supple.  Cardiovascular: Normal rate and regular rhythm.  Exam reveals no friction rub.   No murmur heard. Pulmonary/Chest: Effort normal and breath sounds normal. No respiratory distress. He has no wheezes. He has no rales.  Abdominal: Soft. He exhibits no distension. There is no tenderness. There is no rebound.  Musculoskeletal: Normal range of motion. He exhibits no edema.  Neurological: He is alert and oriented to person, place, and time.  Skin: No  rash noted. He is not diaphoretic.  Nursing note and vitals reviewed.   ED Course  Procedures (including critical care time) Labs Review Labs Reviewed  CBC - Abnormal; Notable for the following:    RBC 3.48 (*)    Hemoglobin 8.9 (*)    HCT 27.9 (*)    MCH 25.6 (*)    All other components within normal limits  BASIC METABOLIC PANEL - Abnormal; Notable for the following:    Glucose, Bld 109 (*)    Creatinine, Ser 1.41 (*)    GFR calc non Af Amer 52 (*)    All other components within normal limits  CBG MONITORING, ED - Abnormal; Notable for the following:    Glucose-Capillary 101 (*)    All other components within normal limits    Imaging Review Dg Chest 2 View  12/29/2014   CLINICAL DATA:  Dizziness. Hypotension for 2 weeks. Cirrhosis. COPD.  EXAM: CHEST  2 VIEW  COMPARISON:  07/17/2014  FINDINGS: Tapering of the peripheral pulmonary vasculature favors emphysema. Atherosclerotic aortic arch with mild tortuosity. Heart size within normal limits.  Old bilateral rib deformities indicating prior fractures. Old left clavicular fracture.  The patient is rotated to the right on today's radiograph, reducing diagnostic sensitivity and specificity. No pleural effusion identified. There is flattening of both hemidiaphragms. Thoracic spondylosis is present.  IMPRESSION:  1. Emphysema. 2. Atherosclerotic and tortuous thoracic aorta. 3. Old healed bilateral rib fractures and old left clavicular fracture.   Electronically Signed   By: Van Clines M.D.   On: 12/29/2014 19:28     EKG Interpretation   Date/Time:  Friday December 29 2014 15:23:30 EDT Ventricular Rate:  83 PR Interval:  144 QRS Duration: 87 QT Interval:  413 QTC Calculation: 485 R Axis:   73 Text Interpretation:  Sinus rhythm Borderline prolonged QT interval No  significant change since last tracing Confirmed by Mingo Amber  MD, Aneta  ([id number]) on 12/29/2014 5:53:57 PM      MDM   Final diagnoses:  Dizzy  Orthostatic dizziness    64 year old male here with dizziness and hypotension. His had dizziness for the past 2 weeks. He said orthostatic hypotension at home. He states most of his dizziness is described as lightheaded and worse when he gets up to do something. He is lying on the right, without any dizziness now. Note dictated changes, headache, weakness or numbness. No chest pain or shortness of breath. Here vitals are stable with a BP of 97/58 initially. We'll obtain orthostatic vital signs. Labs are by triage show mild bump in creatinine to 1.41. Baseline previously around 0.8-0.9. Initially orthostatic, but after 2 L of fluid patient's feeling much better. Of note his blood pressure dropped from the 150s to the 130s during orthostatic vitals when repeated, but he states dizziness had improved. He is well appearing otherwise and labs are okay. Lactate is normal. Discharge follow-up with PCP. Stable for discharge.    Evelina Bucy, MD 12/30/14 [phone]

## 2014-12-29 NOTE — ED Notes (Signed)
CBG 101 

## 2014-12-30 NOTE — Progress Notes (Signed)
Subjective:     Patient ID: Peter [name], male   DOB: 09/25/1950, 64 y.o.   MRN: [id number]  HPI patient presents with a painful plantar callus left that he states he's noticed small black spots with and it makes it hard to walk on. States he's had a removed several times before without relief   Review of Systems  All other systems reviewed and are negative.      Objective:   Physical Exam  Constitutional: He is oriented to person, place, and time.  Cardiovascular: Intact distal pulses.   Musculoskeletal: Normal range of motion.  Neurological: He is oriented to person, place, and time.  Skin: Skin is warm.  Nursing note and vitals reviewed.  neurovascular status intact muscle strength adequate with range of motion within normal limits subtalar midtarsal joint. Patient's noted to have keratotic lesion plantar aspect left forefoot that measures approximately 1 cm x 1 cm with pinpoint bleeding upon debridement and pain to lateral pressure     Assessment:     Verruca plantaris plantar left with possibility for porokeratosis or other unknown soft tissue lesion    Plan:     H&P and condition reviewed. Were to start relatively simple and today using sharp sterile instrumentation debridement accomplished and I applied acid to the area to try to invoke a immune response. Applied sterile dressing instructed what to do with blistering were to occur and reappoint in 4 weeks or earlier if any issues should occur

## 2015-01-02 DIAGNOSIS — Z8673 Personal history of transient ischemic attack (TIA), and cerebral infarction without residual deficits: Secondary | ICD-10-CM | POA: Diagnosis not present

## 2015-01-02 DIAGNOSIS — I10 Essential (primary) hypertension: Secondary | ICD-10-CM | POA: Diagnosis not present

## 2015-01-02 DIAGNOSIS — K7030 Alcoholic cirrhosis of liver without ascites: Secondary | ICD-10-CM | POA: Diagnosis not present

## 2015-01-02 DIAGNOSIS — H409 Unspecified glaucoma: Secondary | ICD-10-CM | POA: Diagnosis not present

## 2015-01-02 DIAGNOSIS — I951 Orthostatic hypotension: Secondary | ICD-10-CM | POA: Diagnosis not present

## 2015-01-02 DIAGNOSIS — J449 Chronic obstructive pulmonary disease, unspecified: Secondary | ICD-10-CM | POA: Diagnosis not present

## 2015-01-02 DIAGNOSIS — F339 Major depressive disorder, recurrent, unspecified: Secondary | ICD-10-CM | POA: Diagnosis not present

## 2015-01-02 DIAGNOSIS — S72111D Displaced fracture of greater trochanter of right femur, subsequent encounter for closed fracture with routine healing: Secondary | ICD-10-CM | POA: Diagnosis not present

## 2015-01-02 DIAGNOSIS — R32 Unspecified urinary incontinence: Secondary | ICD-10-CM | POA: Diagnosis not present

## 2015-01-02 DIAGNOSIS — Z9181 History of falling: Secondary | ICD-10-CM | POA: Diagnosis not present

## 2015-01-02 DIAGNOSIS — F419 Anxiety disorder, unspecified: Secondary | ICD-10-CM | POA: Diagnosis not present

## 2015-01-02 DIAGNOSIS — I426 Alcoholic cardiomyopathy: Secondary | ICD-10-CM | POA: Diagnosis not present

## 2015-01-02 DIAGNOSIS — M6281 Muscle weakness (generalized): Secondary | ICD-10-CM | POA: Diagnosis not present

## 2015-01-02 DIAGNOSIS — F10229 Alcohol dependence with intoxication, unspecified: Secondary | ICD-10-CM | POA: Diagnosis not present

## 2015-01-02 DIAGNOSIS — D649 Anemia, unspecified: Secondary | ICD-10-CM | POA: Diagnosis not present

## 2015-01-03 ENCOUNTER — Telehealth: Payer: Self-pay

## 2015-01-03 ENCOUNTER — Telehealth: Payer: Self-pay | Admitting: Internal Medicine

## 2015-01-03 DIAGNOSIS — R32 Unspecified urinary incontinence: Secondary | ICD-10-CM | POA: Diagnosis not present

## 2015-01-03 DIAGNOSIS — I426 Alcoholic cardiomyopathy: Secondary | ICD-10-CM | POA: Diagnosis not present

## 2015-01-03 DIAGNOSIS — J449 Chronic obstructive pulmonary disease, unspecified: Secondary | ICD-10-CM | POA: Diagnosis not present

## 2015-01-03 DIAGNOSIS — I951 Orthostatic hypotension: Secondary | ICD-10-CM | POA: Diagnosis not present

## 2015-01-03 DIAGNOSIS — M6281 Muscle weakness (generalized): Secondary | ICD-10-CM | POA: Diagnosis not present

## 2015-01-03 DIAGNOSIS — S72111D Displaced fracture of greater trochanter of right femur, subsequent encounter for closed fracture with routine healing: Secondary | ICD-10-CM | POA: Diagnosis not present

## 2015-01-03 DIAGNOSIS — Z9181 History of falling: Secondary | ICD-10-CM | POA: Diagnosis not present

## 2015-01-03 DIAGNOSIS — I10 Essential (primary) hypertension: Secondary | ICD-10-CM | POA: Diagnosis not present

## 2015-01-03 DIAGNOSIS — H409 Unspecified glaucoma: Secondary | ICD-10-CM | POA: Diagnosis not present

## 2015-01-03 DIAGNOSIS — D649 Anemia, unspecified: Secondary | ICD-10-CM | POA: Diagnosis not present

## 2015-01-03 DIAGNOSIS — F419 Anxiety disorder, unspecified: Secondary | ICD-10-CM | POA: Diagnosis not present

## 2015-01-03 DIAGNOSIS — Z8673 Personal history of transient ischemic attack (TIA), and cerebral infarction without residual deficits: Secondary | ICD-10-CM | POA: Diagnosis not present

## 2015-01-03 DIAGNOSIS — K7030 Alcoholic cirrhosis of liver without ascites: Secondary | ICD-10-CM | POA: Diagnosis not present

## 2015-01-03 DIAGNOSIS — F339 Major depressive disorder, recurrent, unspecified: Secondary | ICD-10-CM | POA: Diagnosis not present

## 2015-01-03 DIAGNOSIS — F10229 Alcohol dependence with intoxication, unspecified: Secondary | ICD-10-CM | POA: Diagnosis not present

## 2015-01-03 NOTE — Telephone Encounter (Signed)
Peter [name] from [facility] calling to follow up on previous calls regarding pt's blood pressure, she will be faxing over form request instructions for pt's care.

## 2015-01-03 NOTE — Telephone Encounter (Signed)
Nurse from [facility] called reporting patient bp has been  90/58 sitting , patient is at fall risk. Nurse would like PCP to contact her ASAP. Please f/u

## 2015-01-03 NOTE — Telephone Encounter (Signed)
Returned phone call to Dedra at [facility] At [phone] Margaretann Loveless was not avilable Left message on her voice mail to return our call

## 2015-01-03 NOTE — Telephone Encounter (Signed)
Returned phone call to angela at [facility] [phone] Patient's blood pressure has been running low and they are unable to do physical therapy [facility] looking for guidance from primary doctor  Explained to them Dr Annitta Needs is out of the office until Monday and it will be addressed than Levada Dy is going to fax paper work for Dr Annitta Needs to look at

## 2015-01-04 DIAGNOSIS — I951 Orthostatic hypotension: Secondary | ICD-10-CM | POA: Diagnosis not present

## 2015-01-04 DIAGNOSIS — J449 Chronic obstructive pulmonary disease, unspecified: Secondary | ICD-10-CM | POA: Diagnosis not present

## 2015-01-04 DIAGNOSIS — S72111D Displaced fracture of greater trochanter of right femur, subsequent encounter for closed fracture with routine healing: Secondary | ICD-10-CM | POA: Diagnosis not present

## 2015-01-04 DIAGNOSIS — Z9181 History of falling: Secondary | ICD-10-CM | POA: Diagnosis not present

## 2015-01-04 DIAGNOSIS — F10229 Alcohol dependence with intoxication, unspecified: Secondary | ICD-10-CM | POA: Diagnosis not present

## 2015-01-04 DIAGNOSIS — R32 Unspecified urinary incontinence: Secondary | ICD-10-CM | POA: Diagnosis not present

## 2015-01-04 DIAGNOSIS — I10 Essential (primary) hypertension: Secondary | ICD-10-CM | POA: Diagnosis not present

## 2015-01-04 DIAGNOSIS — K7030 Alcoholic cirrhosis of liver without ascites: Secondary | ICD-10-CM | POA: Diagnosis not present

## 2015-01-04 DIAGNOSIS — H409 Unspecified glaucoma: Secondary | ICD-10-CM | POA: Diagnosis not present

## 2015-01-04 DIAGNOSIS — M6281 Muscle weakness (generalized): Secondary | ICD-10-CM | POA: Diagnosis not present

## 2015-01-04 DIAGNOSIS — F419 Anxiety disorder, unspecified: Secondary | ICD-10-CM | POA: Diagnosis not present

## 2015-01-04 DIAGNOSIS — D649 Anemia, unspecified: Secondary | ICD-10-CM | POA: Diagnosis not present

## 2015-01-04 DIAGNOSIS — I426 Alcoholic cardiomyopathy: Secondary | ICD-10-CM | POA: Diagnosis not present

## 2015-01-04 DIAGNOSIS — F339 Major depressive disorder, recurrent, unspecified: Secondary | ICD-10-CM | POA: Diagnosis not present

## 2015-01-04 DIAGNOSIS — Z8673 Personal history of transient ischemic attack (TIA), and cerebral infarction without residual deficits: Secondary | ICD-10-CM | POA: Diagnosis not present

## 2015-01-05 ENCOUNTER — Encounter (HOSPITAL_COMMUNITY): Payer: Self-pay | Admitting: Emergency Medicine

## 2015-01-05 ENCOUNTER — Inpatient Hospital Stay (HOSPITAL_COMMUNITY)
Admission: EM | Admit: 2015-01-05 | Discharge: 2015-01-08 | DRG: 690 | Disposition: A | Discharge status: Home Health | Payer: Medicare Other | Attending: Internal Medicine | Admitting: Internal Medicine

## 2015-01-05 ENCOUNTER — Emergency Department (HOSPITAL_COMMUNITY): Payer: Medicare Other

## 2015-01-05 DIAGNOSIS — T464X5A Adverse effect of angiotensin-converting-enzyme inhibitors, initial encounter: Secondary | ICD-10-CM | POA: Diagnosis not present

## 2015-01-05 DIAGNOSIS — G8929 Other chronic pain: Secondary | ICD-10-CM | POA: Diagnosis present

## 2015-01-05 DIAGNOSIS — Z886 Allergy status to analgesic agent status: Secondary | ICD-10-CM | POA: Diagnosis not present

## 2015-01-05 DIAGNOSIS — E119 Type 2 diabetes mellitus without complications: Secondary | ICD-10-CM | POA: Diagnosis not present

## 2015-01-05 DIAGNOSIS — Z833 Family history of diabetes mellitus: Secondary | ICD-10-CM

## 2015-01-05 DIAGNOSIS — M1990 Unspecified osteoarthritis, unspecified site: Secondary | ICD-10-CM | POA: Diagnosis not present

## 2015-01-05 DIAGNOSIS — Z803 Family history of malignant neoplasm of breast: Secondary | ICD-10-CM | POA: Diagnosis not present

## 2015-01-05 DIAGNOSIS — I10 Essential (primary) hypertension: Secondary | ICD-10-CM | POA: Diagnosis present

## 2015-01-05 DIAGNOSIS — F339 Major depressive disorder, recurrent, unspecified: Secondary | ICD-10-CM | POA: Diagnosis not present

## 2015-01-05 DIAGNOSIS — I426 Alcoholic cardiomyopathy: Secondary | ICD-10-CM | POA: Diagnosis present

## 2015-01-05 DIAGNOSIS — F329 Major depressive disorder, single episode, unspecified: Secondary | ICD-10-CM | POA: Diagnosis present

## 2015-01-05 DIAGNOSIS — H9190 Unspecified hearing loss, unspecified ear: Secondary | ICD-10-CM | POA: Diagnosis not present

## 2015-01-05 DIAGNOSIS — J449 Chronic obstructive pulmonary disease, unspecified: Secondary | ICD-10-CM | POA: Diagnosis not present

## 2015-01-05 DIAGNOSIS — Z993 Dependence on wheelchair: Secondary | ICD-10-CM

## 2015-01-05 DIAGNOSIS — F419 Anxiety disorder, unspecified: Secondary | ICD-10-CM | POA: Diagnosis present

## 2015-01-05 DIAGNOSIS — J439 Emphysema, unspecified: Secondary | ICD-10-CM | POA: Diagnosis not present

## 2015-01-05 DIAGNOSIS — D649 Anemia, unspecified: Secondary | ICD-10-CM | POA: Diagnosis not present

## 2015-01-05 DIAGNOSIS — I951 Orthostatic hypotension: Secondary | ICD-10-CM | POA: Diagnosis not present

## 2015-01-05 DIAGNOSIS — N390 Urinary tract infection, site not specified: Secondary | ICD-10-CM | POA: Diagnosis not present

## 2015-01-05 DIAGNOSIS — B9689 Other specified bacterial agents as the cause of diseases classified elsewhere: Secondary | ICD-10-CM | POA: Diagnosis present

## 2015-01-05 DIAGNOSIS — Z8249 Family history of ischemic heart disease and other diseases of the circulatory system: Secondary | ICD-10-CM | POA: Diagnosis not present

## 2015-01-05 DIAGNOSIS — Z720 Tobacco use: Secondary | ICD-10-CM | POA: Diagnosis present

## 2015-01-05 DIAGNOSIS — E785 Hyperlipidemia, unspecified: Secondary | ICD-10-CM | POA: Diagnosis present

## 2015-01-05 DIAGNOSIS — Z8673 Personal history of transient ischemic attack (TIA), and cerebral infarction without residual deficits: Secondary | ICD-10-CM

## 2015-01-05 DIAGNOSIS — I700 Atherosclerosis of aorta: Secondary | ICD-10-CM | POA: Diagnosis not present

## 2015-01-05 DIAGNOSIS — Z79899 Other long term (current) drug therapy: Secondary | ICD-10-CM | POA: Diagnosis not present

## 2015-01-05 DIAGNOSIS — E860 Dehydration: Secondary | ICD-10-CM | POA: Diagnosis present

## 2015-01-05 DIAGNOSIS — E876 Hypokalemia: Secondary | ICD-10-CM | POA: Diagnosis present

## 2015-01-05 DIAGNOSIS — H409 Unspecified glaucoma: Secondary | ICD-10-CM | POA: Diagnosis not present

## 2015-01-05 DIAGNOSIS — E440 Moderate protein-calorie malnutrition: Secondary | ICD-10-CM | POA: Diagnosis present

## 2015-01-05 DIAGNOSIS — M6281 Muscle weakness (generalized): Secondary | ICD-10-CM | POA: Diagnosis not present

## 2015-01-05 DIAGNOSIS — N179 Acute kidney failure, unspecified: Secondary | ICD-10-CM | POA: Diagnosis present

## 2015-01-05 DIAGNOSIS — D638 Anemia in other chronic diseases classified elsewhere: Secondary | ICD-10-CM | POA: Diagnosis present

## 2015-01-05 DIAGNOSIS — Z9181 History of falling: Secondary | ICD-10-CM | POA: Diagnosis not present

## 2015-01-05 DIAGNOSIS — K7030 Alcoholic cirrhosis of liver without ascites: Secondary | ICD-10-CM | POA: Diagnosis present

## 2015-01-05 DIAGNOSIS — D72819 Decreased white blood cell count, unspecified: Secondary | ICD-10-CM | POA: Diagnosis not present

## 2015-01-05 DIAGNOSIS — T501X5A Adverse effect of loop [high-ceiling] diuretics, initial encounter: Secondary | ICD-10-CM | POA: Diagnosis not present

## 2015-01-05 DIAGNOSIS — I5042 Chronic combined systolic (congestive) and diastolic (congestive) heart failure: Secondary | ICD-10-CM | POA: Diagnosis not present

## 2015-01-05 DIAGNOSIS — S72111D Displaced fracture of greater trochanter of right femur, subsequent encounter for closed fracture with routine healing: Secondary | ICD-10-CM | POA: Diagnosis not present

## 2015-01-05 DIAGNOSIS — F17210 Nicotine dependence, cigarettes, uncomplicated: Secondary | ICD-10-CM | POA: Diagnosis present

## 2015-01-05 DIAGNOSIS — I959 Hypotension, unspecified: Secondary | ICD-10-CM

## 2015-01-05 DIAGNOSIS — F1010 Alcohol abuse, uncomplicated: Secondary | ICD-10-CM | POA: Diagnosis not present

## 2015-01-05 DIAGNOSIS — F10229 Alcohol dependence with intoxication, unspecified: Secondary | ICD-10-CM | POA: Diagnosis not present

## 2015-01-05 DIAGNOSIS — R32 Unspecified urinary incontinence: Secondary | ICD-10-CM | POA: Diagnosis not present

## 2015-01-05 LAB — CBC WITH DIFFERENTIAL/PLATELET
BASOS ABS: 0 10*3/uL (ref 0.0–0.1)
Basophils Relative: 1 % (ref 0–1)
Eosinophils Absolute: 0.1 10*3/uL (ref 0.0–0.7)
Eosinophils Relative: 1 % (ref 0–5)
HCT: 30.6 % — ABNORMAL LOW (ref 39.0–52.0)
Hemoglobin: 9.7 g/dL — ABNORMAL LOW (ref 13.0–17.0)
Lymphocytes Relative: 39 % (ref 12–46)
Lymphs Abs: 1.7 10*3/uL (ref 0.7–4.0)
MCH: 25 pg — ABNORMAL LOW (ref 26.0–34.0)
MCHC: 31.7 g/dL (ref 30.0–36.0)
MCV: 78.9 fL (ref 78.0–100.0)
Monocytes Absolute: 0.6 10*3/uL (ref 0.1–1.0)
Monocytes Relative: 14 % — ABNORMAL HIGH (ref 3–12)
Neutro Abs: 1.9 10*3/uL (ref 1.7–7.7)
Neutrophils Relative %: 45 % (ref 43–77)
Platelets: 343 10*3/uL (ref 150–400)
RBC: 3.88 MIL/uL — ABNORMAL LOW (ref 4.22–5.81)
RDW: 14.8 % (ref 11.5–15.5)
WBC: 4.2 10*3/uL (ref 4.0–10.5)

## 2015-01-05 LAB — COMPREHENSIVE METABOLIC PANEL
ALBUMIN: 3.9 g/dL (ref 3.5–5.0)
ALT: 8 U/L — ABNORMAL LOW (ref 17–63)
AST: 16 U/L (ref 15–41)
Alkaline Phosphatase: 59 U/L (ref 38–126)
Anion gap: 12 (ref 5–15)
BUN: 21 mg/dL — ABNORMAL HIGH (ref 6–20)
CALCIUM: 9.3 mg/dL (ref 8.9–10.3)
CO2: 25 mmol/L (ref 22–32)
Chloride: 99 mmol/L — ABNORMAL LOW (ref 101–111)
Creatinine, Ser: 1.86 mg/dL — ABNORMAL HIGH (ref 0.61–1.24)
GFR calc Af Amer: 43 mL/min — ABNORMAL LOW (ref >60)
GFR calc non Af Amer: 37 mL/min — ABNORMAL LOW (ref >60)
Glucose, Bld: 72 mg/dL (ref 65–99)
POTASSIUM: 3.3 mmol/L — AB (ref 3.5–5.1)
Sodium: 136 mmol/L (ref 135–145)
Total Bilirubin: 0.4 mg/dL (ref 0.3–1.2)
Total Protein: 8 g/dL (ref 6.5–8.1)

## 2015-01-05 LAB — PHOSPHORUS: Phosphorus: 4.1 mg/dL (ref 2.5–4.6)

## 2015-01-05 LAB — URINE MICROSCOPIC-ADD ON

## 2015-01-05 LAB — URINALYSIS, ROUTINE W REFLEX MICROSCOPIC
GLUCOSE, UA: NEGATIVE mg/dL
HGB URINE DIPSTICK: NEGATIVE
KETONES UR: NEGATIVE mg/dL
Nitrite: NEGATIVE
Protein, ur: NEGATIVE mg/dL
Specific Gravity, Urine: 1.013 (ref 1.005–1.030)
Urobilinogen, UA: 0.2 mg/dL (ref 0.0–1.0)
pH: 5.5 (ref 5.0–8.0)

## 2015-01-05 LAB — RAPID URINE DRUG SCREEN, HOSP PERFORMED
Amphetamines: NOT DETECTED
BENZODIAZEPINES: NOT DETECTED
Barbiturates: NOT DETECTED
COCAINE: NOT DETECTED
Opiates: NOT DETECTED
Tetrahydrocannabinol: NOT DETECTED

## 2015-01-05 LAB — I-STAT TROPONIN, ED: Troponin i, poc: 0 ng/mL (ref 0.00–0.08)

## 2015-01-05 LAB — ETHANOL

## 2015-01-05 LAB — MAGNESIUM: Magnesium: 1.3 mg/dL — ABNORMAL LOW (ref 1.7–2.4)

## 2015-01-05 LAB — I-STAT CG4 LACTIC ACID, ED: Lactic Acid, Venous: 1.7 mmol/L (ref 0.5–2.0)

## 2015-01-05 MED ORDER — BRIMONIDINE TARTRATE 0.2 % OP SOLN
1.0000 [drp] | Freq: Two times a day (BID) | OPHTHALMIC | Status: DC
  Administered 2015-01-05 – 2015-01-08 (×6): 1 [drp] via OPHTHALMIC
  Filled 2015-01-05: qty 5

## 2015-01-05 MED ORDER — SODIUM CHLORIDE 0.9 % IJ SOLN
3.0000 mL | Freq: Two times a day (BID) | INTRAMUSCULAR | Status: DC
  Administered 2015-01-07 – 2015-01-08 (×2): 3 mL via INTRAVENOUS

## 2015-01-05 MED ORDER — CARVEDILOL 3.125 MG PO TABS
3.1250 mg | ORAL_TABLET | Freq: Two times a day (BID) | ORAL | Status: DC
  Administered 2015-01-06: 3.125 mg via ORAL
  Filled 2015-01-05: qty 1

## 2015-01-05 MED ORDER — CEFTRIAXONE SODIUM IN DEXTROSE 20 MG/ML IV SOLN
1.0000 g | INTRAVENOUS | Status: DC
  Administered 2015-01-06 – 2015-01-07 (×2): 1 g via INTRAVENOUS
  Filled 2015-01-05 (×3): qty 50

## 2015-01-05 MED ORDER — HYDROCODONE-ACETAMINOPHEN 5-325 MG PO TABS: 1.0000 | ORAL_TABLET | ORAL | Status: DC | PRN

## 2015-01-05 MED ORDER — BUDESONIDE-FORMOTEROL FUMARATE 80-4.5 MCG/ACT IN AERO
2.0000 | INHALATION_SPRAY | Freq: Two times a day (BID) | RESPIRATORY_TRACT | Status: DC
  Administered 2015-01-05 – 2015-01-08 (×6): 2 via RESPIRATORY_TRACT
  Filled 2015-01-05: qty 6.9

## 2015-01-05 MED ORDER — SODIUM CHLORIDE 0.9 % IV SOLN
INTRAVENOUS | Status: DC
  Administered 2015-01-05 – 2015-01-06 (×2): via INTRAVENOUS

## 2015-01-05 MED ORDER — POTASSIUM CHLORIDE CRYS ER 20 MEQ PO TBCR
20.0000 meq | EXTENDED_RELEASE_TABLET | Freq: Once | ORAL | Status: AC
Start: 2015-01-05 — End: 2015-01-05
  Administered 2015-01-05: 20 meq via ORAL
  Filled 2015-01-05: qty 1

## 2015-01-05 MED ORDER — GABAPENTIN 300 MG PO CAPS
300.0000 mg | ORAL_CAPSULE | Freq: Two times a day (BID) | ORAL | Status: DC
  Administered 2015-01-05 – 2015-01-08 (×6): 300 mg via ORAL
  Filled 2015-01-05 (×6): qty 1

## 2015-01-05 MED ORDER — FOLIC ACID 1 MG PO TABS
1.0000 mg | ORAL_TABLET | Freq: Every day | ORAL | Status: DC
  Administered 2015-01-05 – 2015-01-08 (×4): 1 mg via ORAL
  Filled 2015-01-05 (×4): qty 1

## 2015-01-05 MED ORDER — TAMSULOSIN HCL 0.4 MG PO CAPS
0.4000 mg | ORAL_CAPSULE | Freq: Every day | ORAL | Status: DC
  Administered 2015-01-05 – 2015-01-07 (×3): 0.4 mg via ORAL
  Filled 2015-01-05 (×3): qty 1

## 2015-01-05 MED ORDER — ACETAMINOPHEN 325 MG PO TABS: 650.0000 mg | ORAL_TABLET | Freq: Four times a day (QID) | ORAL | Status: DC | PRN

## 2015-01-05 MED ORDER — VITAMIN B-1 100 MG PO TABS
100.0000 mg | ORAL_TABLET | Freq: Every day | ORAL | Status: DC
  Administered 2015-01-05 – 2015-01-08 (×4): 100 mg via ORAL
  Filled 2015-01-05 (×4): qty 1

## 2015-01-05 MED ORDER — IPRATROPIUM-ALBUTEROL 0.5-2.5 (3) MG/3ML IN SOLN: 3.0000 mL | RESPIRATORY_TRACT | Status: DC | PRN

## 2015-01-05 MED ORDER — SODIUM CHLORIDE 0.9 % IV BOLUS (SEPSIS)
500.0000 mL | Freq: Once | INTRAVENOUS | Status: AC
  Administered 2015-01-05: 500 mL via INTRAVENOUS

## 2015-01-05 MED ORDER — ENOXAPARIN SODIUM 30 MG/0.3ML ~~LOC~~ SOLN
30.0000 mg | SUBCUTANEOUS | Status: DC
  Administered 2015-01-05: 30 mg via SUBCUTANEOUS
  Filled 2015-01-05: qty 0.3

## 2015-01-05 MED ORDER — ADULT MULTIVITAMIN W/MINERALS CH
1.0000 | ORAL_TABLET | Freq: Every day | ORAL | Status: DC
  Administered 2015-01-05 – 2015-01-08 (×4): 1 via ORAL
  Filled 2015-01-05 (×4): qty 1

## 2015-01-05 MED ORDER — NICOTINE 21 MG/24HR TD PT24
21.0000 mg | MEDICATED_PATCH | Freq: Every day | TRANSDERMAL | Status: DC
  Administered 2015-01-05 – 2015-01-08 (×4): 21 mg via TRANSDERMAL
  Filled 2015-01-05 (×4): qty 1

## 2015-01-05 MED ORDER — ATORVASTATIN CALCIUM 80 MG PO TABS
80.0000 mg | ORAL_TABLET | Freq: Every day | ORAL | Status: DC
  Administered 2015-01-06 – 2015-01-07 (×2): 80 mg via ORAL
  Filled 2015-01-05 (×3): qty 1

## 2015-01-05 MED ORDER — TRAZODONE HCL 50 MG PO TABS
50.0000 mg | ORAL_TABLET | Freq: Every day | ORAL | Status: DC
  Administered 2015-01-05 – 2015-01-07 (×3): 50 mg via ORAL
  Filled 2015-01-05 (×4): qty 1

## 2015-01-05 MED ORDER — PANTOPRAZOLE SODIUM 40 MG PO TBEC
40.0000 mg | DELAYED_RELEASE_TABLET | Freq: Every day | ORAL | Status: DC
  Administered 2015-01-06 – 2015-01-08 (×3): 40 mg via ORAL
  Filled 2015-01-05 (×3): qty 1

## 2015-01-05 MED ORDER — ACAMPROSATE CALCIUM 333 MG PO TBEC: 666.0000 mg | DELAYED_RELEASE_TABLET | Freq: Three times a day (TID) | ORAL | Status: DC

## 2015-01-05 MED ORDER — DEXTROSE 5 % IV SOLN
1.0000 g | Freq: Once | INTRAVENOUS | Status: AC
  Administered 2015-01-05: 1 g via INTRAVENOUS
  Filled 2015-01-05: qty 10

## 2015-01-05 MED ORDER — ACETAMINOPHEN 650 MG RE SUPP
650.0000 mg | Freq: Four times a day (QID) | RECTAL | Status: DC | PRN
Start: 2015-01-05 — End: 2015-01-08

## 2015-01-05 MED ORDER — ACAMPROSATE CALCIUM 333 MG PO TBEC
333.0000 mg | DELAYED_RELEASE_TABLET | Freq: Three times a day (TID) | ORAL | Status: DC
  Administered 2015-01-06: 333 mg via ORAL
  Filled 2015-01-05 (×4): qty 1

## 2015-01-05 MED ORDER — SODIUM CHLORIDE 0.9 % IV BOLUS (SEPSIS)
1000.0000 mL | Freq: Once | INTRAVENOUS | Status: AC
  Administered 2015-01-05: 1000 mL via INTRAVENOUS

## 2015-01-05 NOTE — H&P (Signed)
PCP:  Lorayne Marek, MD     Referring provider Thurnell Garbe   Chief Complaint:  Lightheaded low blood pressure  HPI: Peter [name] is a 64 y.o. male   has a past medical history of Hypertension; Hyperlipemia; Emphysema; Stroke; Chronic pain; Cirrhosis of liver; Fall; Dental injury; Bone spur of other site; Impaired mobility; Total self care deficit; Pneumonia; Emphysema; ETOH abuse; Elevated LFTs; Arthritis; Anxiety; Sinusitis; Anemia; Hard of hearing; Diabetes mellitus without complication; COPD (chronic obstructive pulmonary disease); Depression; Glaucoma; Emphysema of lung; and Cataract.   Presented with feeling unwell or the past week. Week ago he was noted to have an episode of hypotension as low 6450 associated dizziness when he stands up. 24th of June patient was seen in the emergency department and treated with IV fluids after which his orthostatics have improved and he was discharged to home. Unfortunately some after he continued to have low blood pressures. His physical therapy was unable to be continued due to low blood pressure readings. Impression presented today and can emerge department with evidence of symptomatic hypotension. In emergency department initially blood pressure is low 69/46 patient was given 1.5 L of normal saline and then started on IV fluids. He was noted to have urinary tract infections given a dose of Rocephin IV. Patient did not endorse any fevers or chills. Then set of his symptoms has been gradual. He does not endorse any nausea vomiting or diarrhea. States he has stopped drinking but continues to smoke. Reports decreased PO intake. He reports drinking some water but does not eat much. He is taking coreg, lisinopril and Lasix daily. He reports large amount of urinary output.  His labs were significant for acute renal failure likely secondary to dehydration. Creatinine up to 1.86 from baseline of 0.78 Of note patient was noted to be orthostatic in the emergency  department.   Of note mid-May patient had an episode of syncope resulting in mildly displaced right hip trochanteric fracture which was treated conservatively. This syncope was thought to be secondary to orthostasis. Patient was treated with IV fluids and orthostasis has resolved. In the past patient has history of alcoholic cardiomyopathy but per echogram obtained in May his EF has improed up to 50-55 percent and he showed some evidence of mild diastolic dysfunction. At that time the added Coreg and lisinopril. Patient has known history of alcoholic liver cirrhosis. Patient has history of alcohol abuse. And was started on Librium protocol at the time of that admission. Propranolol was discontinued after EGD showed no evidence of varices. Patient was discharged to skilled nursing facility on May 19. Patient was discharged from nursing facility in beginning of June.   Hospitalist was called for admission for UTI and Orthostatic hypotension with evidence of acute renal failure  Review of Systems:    Pertinent positives include:  Lightheaded,  fatigue,  Constitutional:  No weight loss, night sweats, Fevers, chills, weight loss  HEENT:  No headaches, Difficulty swallowing,Tooth/dental problems,Sore throat,  No sneezing, itching, ear ache, nasal congestion, post nasal drip,  Cardio-vascular:  No chest pain, Orthopnea, PND, anasarca, dizziness, palpitations.no Bilateral lower extremity swelling  GI:  No heartburn, indigestion, abdominal pain, nausea, vomiting, diarrhea, change in bowel habits, loss of appetite, melena, blood in stool, hematemesis Resp:  no shortness of breath at rest. No dyspnea on exertion, No excess mucus, no productive cough, No non-productive cough, No coughing up of blood.No change in color of mucus.No wheezing. Skin:  no rash or lesions. No jaundice GU:  no dysuria, change in color of urine, no urgency or frequency. No straining to urinate.  No flank pain.  Musculoskeletal:   No joint pain or no joint swelling. No decreased range of motion. No back pain.  Psych:  No change in mood or affect. No depression or anxiety. No memory loss.  Neuro: no localizing neurological complaints, no tingling, no weakness, no double vision, no gait abnormality, no slurred speech, no confusion  Otherwise ROS are negative except for above, 10 systems were reviewed  Past Medical History: Past Medical History  Diagnosis Date  . Hypertension   . Hyperlipemia   . Emphysema   . Stroke   . Chronic pain   . Cirrhosis of liver   . Fall   . Dental injury   . Bone spur of other site     Left Foot  . Impaired mobility   . Total self care deficit   . Pneumonia   . Emphysema   . ETOH abuse   . Elevated LFTs   . Arthritis   . Anxiety   . Sinusitis   . Anemia     iron def.  . Hard of hearing   . Diabetes mellitus without complication   . COPD (chronic obstructive pulmonary disease)   . Depression   . Glaucoma   . Emphysema of lung   . Cataract    Past Surgical History  Procedure Laterality Date  . Esophagogastroduodenoscopy  01/09/2012    Procedure: ESOPHAGOGASTRODUODENOSCOPY (EGD);  Surgeon: Beryle Beams, MD;  Location: [facility] ENDOSCOPY;  Service: Endoscopy;  Laterality: N/A;  . Multiple extractions with alveoloplasty  03/29/2012    Procedure: MULTIPLE EXTRACION WITH ALVEOLOPLASTY;  Surgeon: Gae Bon, DDS;  Location: [facility];  Service: Oral Surgery;  Laterality: Bilateral;  . Bone spur  2013    left spur  . Cataract extraction w/ intraocular lens implant    . Mouth surgery    . Colonoscopy       Medications: Prior to Admission medications   Medication Sig Start Date End Date Taking? Authorizing Provider  acamprosate (CAMPRAL) 333 MG tablet Take 2 tablets (666 mg total) by mouth 3 (three) times daily with meals. 07/17/14  Yes Lorayne Marek, MD  albuterol (PROVENTIL HFA;VENTOLIN HFA) 108 (90 BASE) MCG/ACT inhaler Inhale 2 puffs into the lungs 4 (four) times daily.  07/17/14  Yes Lorayne Marek, MD  atorvastatin (LIPITOR) 80 MG tablet Take 1 tablet (80 mg total) by mouth daily. 07/17/14  Yes Deepak Advani, MD  brimonidine (ALPHAGAN) 0.2 % ophthalmic solution Place 1 drop into both eyes 2 (two) times daily. 12/15/13  Yes Lorayne Marek, MD  budesonide-formoterol (SYMBICORT) 80-4.5 MCG/ACT inhaler Inhale 2 puffs into the lungs 2 (two) times daily. 07/17/14  Yes Lorayne Marek, MD  carvedilol (COREG) 6.25 MG tablet Take 1 tablet (6.25 mg total) by mouth 2 (two) times daily with a meal. 11/23/14  Yes Nishant Dhungel, MD  furosemide (LASIX) 40 MG tablet Take 1 tablet (40 mg total) by mouth daily. 07/17/14  Yes Lorayne Marek, MD  gabapentin (NEURONTIN) 300 MG capsule Take 300 mg by mouth 2 (two) times daily.    Yes Historical Provider, MD  lisinopril (PRINIVIL,ZESTRIL) 5 MG tablet Take 1 tablet (5 mg total) by mouth daily. 11/23/14  Yes Nishant Dhungel, MD  pantoprazole (PROTONIX) 40 MG tablet Take 1 tablet (40 mg total) by mouth daily. 07/17/14  Yes Lorayne Marek, MD  tamsulosin (FLOMAX) 0.4 MG CAPS capsule Take 0.4 mg by mouth  at bedtime.   Yes Historical Provider, MD  traZODone (DESYREL) 50 MG tablet Take 1 tablet (50 mg total) by mouth at bedtime. 07/17/14  Yes Lorayne Marek, MD  vitamin E 200 UNIT capsule Take 1 capsule (200 Units total) by mouth daily. 02/16/13  Yes Tresa Garter, MD  folic acid (FOLVITE) 1 MG tablet Take 1 tablet (1 mg total) by mouth daily. Patient not taking: Reported on 01/05/2015 07/17/14   Lorayne Marek, MD  ipratropium-albuterol (DUONEB) 0.5-2.5 (3) MG/3ML SOLN Take 3 mLs by nebulization every 4 (four) hours as needed. 05/28/14   Theodis Blaze, MD  Multiple Vitamin (MULTIVITAMIN) tablet Take 1 tablet by mouth daily. Patient not taking: Reported on 12/29/2014 02/16/13   Tresa Garter, MD  nicotine (NICODERM CQ) 21 mg/24hr patch Place 1 patch (21 mg total) onto the skin daily. 12/21/14   Lorayne Marek, MD  potassium chloride SA (K-DUR,KLOR-CON)  20 MEQ tablet Take 1 tablet (20 mEq total) by mouth daily. Patient not taking: Reported on 12/29/2014 07/17/14   Lorayne Marek, MD  thiamine (VITAMIN B-1) 100 MG tablet Take 1 tablet (100 mg total) by mouth daily. For vitamin B deficiency. Patient not taking: Reported on 12/29/2014 12/15/13   Lorayne Marek, MD  vitamin B-12 (CYANOCOBALAMIN) 100 MCG tablet Take 1 tablet (100 mcg total) by mouth daily. For low B12. Patient not taking: Reported on 12/29/2014 12/15/13   Lorayne Marek, MD    Allergies:   Allergies  Allergen Reactions  . Aspirin     Unknown- MD said do not take this medication  . Poison Ivy Extract [Extract Of Poison Ivy] Rash    Social History:  Ambulatory  independently walker cane, wheelchair bound, bed bound Lives at home  With family     reports that he has been smoking Cigarettes.  He has a 30 pack-year smoking history. He has never used smokeless tobacco. He reports that he drinks about 2.4 oz of alcohol per week. He reports that he does not use illicit drugs.    Family History: family history includes Breast cancer in his mother; Diabetes in his maternal aunt; Heart disease in his maternal aunt and sister.    Physical Exam: Patient Vitals for the past 24 hrs:  BP Temp Temp src Pulse Resp SpO2  01/05/15 1830 113/55 mmHg - - 78 24 100 %  01/05/15 1818 100/65 mmHg 99.6 F (37.6 C) Rectal 80 22 100 %  01/05/15 1815 113/76 mmHg - - 82 15 100 %  01/05/15 1722 (!) 90/47 mmHg - - 77 22 100 %  01/05/15 1614 (!) 69/46 mmHg 97.7 F (36.5 C) Oral 114 23 100 %    1. General:  in No Acute distress 2. Psychological: Alert and   Oriented 3. Head/ENT:     Dry Mucous Membranes                          Head Non traumatic, neck supple                           Poor Dentition 4. SKIN:   decreased Skin turgor,  Skin clean Dry and intact no rash 5. Heart: Regular rate and rhythm no Murmur, Rub or gallop 6. Lungs: Clear to auscultation bilaterally, no wheezes or crackles   7.  Abdomen: Soft, non-tender, Non distended 8. Lower extremities: no clubbing, cyanosis, or edema 9. Neurologically Grossly intact, moving all 4  extremities equally 10. MSK: Normal range of motion  body mass index is unknown because there is no weight on file.   Labs on Admission:   Results for orders placed or performed during the hospital encounter of 01/05/15 (from the past 24 hour(s))  Comprehensive metabolic panel     Status: Abnormal   Collection Time: 01/05/15  5:07 PM  Result Value Ref Range   Sodium 136 135 - 145 mmol/L   Potassium 3.3 (L) 3.5 - 5.1 mmol/L   Chloride 99 (L) 101 - 111 mmol/L   CO2 25 22 - 32 mmol/L   Glucose, Bld 72 65 - 99 mg/dL   BUN 21 (H) 6 - 20 mg/dL   Creatinine, Ser 1.86 (H) 0.61 - 1.24 mg/dL   Calcium 9.3 8.9 - 10.3 mg/dL   Total Protein 8.0 6.5 - 8.1 g/dL   Albumin 3.9 3.5 - 5.0 g/dL   AST 16 15 - 41 U/L   ALT 8 (L) 17 - 63 U/L   Alkaline Phosphatase 59 38 - 126 U/L   Total Bilirubin 0.4 0.3 - 1.2 mg/dL   GFR calc non Af Amer 37 (L) >60 mL/min   GFR calc Af Amer 43 (L) >60 mL/min   Anion gap 12 5 - 15  Ethanol     Status: None   Collection Time: 01/05/15  5:07 PM  Result Value Ref Range   Alcohol, Ethyl (B) <5 <5 mg/dL  CBC with Differential     Status: Abnormal   Collection Time: 01/05/15  5:07 PM  Result Value Ref Range   WBC 4.2 4.0 - 10.5 K/uL   RBC 3.88 (L) 4.22 - 5.81 MIL/uL   Hemoglobin 9.7 (L) 13.0 - 17.0 g/dL   HCT 30.6 (L) 39.0 - 52.0 %   MCV 78.9 78.0 - 100.0 fL   MCH 25.0 (L) 26.0 - 34.0 pg   MCHC 31.7 30.0 - 36.0 g/dL   RDW 14.8 11.5 - 15.5 %   Platelets 343 150 - 400 K/uL   Neutrophils Relative % 45 43 - 77 %   Neutro Abs 1.9 1.7 - 7.7 K/uL   Lymphocytes Relative 39 12 - 46 %   Lymphs Abs 1.7 0.7 - 4.0 K/uL   Monocytes Relative 14 (H) 3 - 12 %   Monocytes Absolute 0.6 0.1 - 1.0 K/uL   Eosinophils Relative 1 0 - 5 %   Eosinophils Absolute 0.1 0.0 - 0.7 K/uL   Basophils Relative 1 0 - 1 %   Basophils Absolute 0.0 0.0 -  0.1 K/uL  I-stat troponin, ED     Status: None   Collection Time: 01/05/15  5:11 PM  Result Value Ref Range   Troponin i, poc 0.00 0.00 - 0.08 ng/mL   Comment 3          I-Stat CG4 Lactic Acid, ED     Status: None   Collection Time: 01/05/15  5:14 PM  Result Value Ref Range   Lactic Acid, Venous 1.70 0.5 - 2.0 mmol/L  Urinalysis, Routine w reflex microscopic (not at [facility])     Status: Abnormal   Collection Time: 01/05/15  6:06 PM  Result Value Ref Range   Color, Urine AMBER (A) YELLOW   APPearance CLOUDY (A) CLEAR   Specific Gravity, Urine 1.013 1.005 - 1.030   pH 5.5 5.0 - 8.0   Glucose, UA NEGATIVE NEGATIVE mg/dL   Hgb urine dipstick NEGATIVE NEGATIVE   Bilirubin Urine SMALL (A) NEGATIVE  Ketones, ur NEGATIVE NEGATIVE mg/dL   Protein, ur NEGATIVE NEGATIVE mg/dL   Urobilinogen, UA 0.2 0.0 - 1.0 mg/dL   Nitrite NEGATIVE NEGATIVE   Leukocytes, UA LARGE (A) NEGATIVE  Urine microscopic-add on     Status: Abnormal   Collection Time: 01/05/15  6:06 PM  Result Value Ref Range   Squamous Epithelial / LPF MANY (A) RARE   WBC, UA TOO NUMEROUS TO COUNT <3 WBC/hpf   Bacteria, UA MANY (A) RARE  Urine rapid drug screen (hosp performed)     Status: None   Collection Time: 01/05/15  6:07 PM  Result Value Ref Range   Opiates NONE DETECTED NONE DETECTED   Cocaine NONE DETECTED NONE DETECTED   Benzodiazepines NONE DETECTED NONE DETECTED   Amphetamines NONE DETECTED NONE DETECTED   Tetrahydrocannabinol NONE DETECTED NONE DETECTED   Barbiturates NONE DETECTED NONE DETECTED    UA evidence of UTI  Lab Results  Component Value Date   HGBA1C 4.4 12/15/2013    Estimated Creatinine Clearance: 39.7 mL/min (by C-G formula based on Cr of 1.86).  BNP (last 3 results)  Recent Labs  05/28/14 0500  PROBNP 6527.0*    Other results:  I have pearsonaly reviewed this: ECG REPORT  Rate:100  Rhythm: sinus tachycardia  ST&T Change: No evidence of ischemia [id number]  There were no vitals filed  for this visit.   Cultures:    Component Value Date/Time   SDES BLOOD RIGHT HAND 05/30/2014 0800   SPECREQUEST BOTTLES DRAWN AEROBIC AND ANAEROBIC 6CC EACH 05/30/2014 0800   CULT  05/30/2014 0800    NO GROWTH 5 DAYS Performed at [place] 06/05/2014 FINAL 05/30/2014 0800     Radiological Exams on Admission: Dg Chest Port 1 View  01/05/2015   CLINICAL DATA:  Weakness and dizziness for 1 week. Emphysema. COPD. Ethanol abuse.  EXAM: PORTABLE CHEST - 1 VIEW  COMPARISON:  12/29/2014  FINDINGS: Remote right rib and left clavicular fractures. Midline trachea. Normal heart size. Atherosclerosis in the transverse aorta. No pleural effusion or pneumothorax. Diffuse peribronchial thickening. No lobar consolidation.  IMPRESSION: COPD/chronic bronchitis.  No acute superimposed process.  Aortic atherosclerosis.   Electronically Signed   By: Abigail Miyamoto M.D.   On: 01/05/2015 17:00    Chart has been reviewed  Family  at  Bedside  plan of care was discussed with Mother Leodis Rains. Jimmye Norman [phone]  Assessment/Plan  64 year-old gentleman history of alcohol abuse in remission and 72 of alcohol cirrhosis, COPD, history of systolic heart failure currently improving presents with recurrent orthostatic hypotension in a setting of taking Lasix  and poor by mouth intake  Present on Admission:  . Acute renal failure   - likely secondary to overdiuresis patient's EF have steadily improved but Lasix dose  continue to be 40 mg a day he likely does not require this degree of diuresis. Patient's orthostatics have improved after given IV fluids and then recurred after he went home and took his medications will discontinue Lasix for now give gentle IV fluids and continue to observe. We'll check urine electrolytes  Hold lisinopril given acute renal failure  . COPD (chronic obstructive pulmonary disease) continue home medications currently appears to be stable.  . Hypotension, postural -  right is secondary to over diuresis will hold Lasix give gentle fluids  . UTI (lower urinary tract infection) treated Rocephin await results of urine culture  . Chronic alcohol abuse currently in remission patient states he  is no longer drinking will continue to monitor  . Tobacco abuse spoke about importance of quitting continue nicotine patch  . Malnutrition of moderate degree check prealbumin order nutritional consult  . Hypokalemia will replace gently Prophylaxis:  Lovenox  History of systolic heart failure - last echogram showed improvement LVEF up to 50-55 percent. Will stop Lasix for tonight give gentle IV fluids patient have had decreased by mouth intake. States he has to drink large amount of water to keep up his urine output. We'll see how he does of Lasix if needed may need to be restart at lower dose CODE STATUS:  FULL CODE   as per patient    Disposition:   To home once workup is complete and patient is stable  Other plan as per orders.  I have spent a total of 55 min on this admission  Ellwood Steidle 01/05/2015, 7:18 PM  [facility]  Pager [phone]   after 2 AM please page floor coverage PA If 7AM-7PM, please contact the day team taking care of the patient  [email or web address]  Password [email or web address]

## 2015-01-05 NOTE — ED Notes (Signed)
Pt was able to void Rn notified  

## 2015-01-05 NOTE — ED Notes (Signed)
Pt reports he was here last week for hypotension and was given 2L NS. Today having the same symptoms of dizziness. Hypotensive in triage.

## 2015-01-05 NOTE — ED Provider Notes (Signed)
CSN: [id number]     Arrival date & time 01/05/15  1604 History   First MD Initiated Contact with Patient 01/05/15 1623     Chief Complaint  Patient presents with  . Hypotension      HPI Pt was seen at 1625. Per pt and his wife, c/o gradual onset and worsening of persistent generalized weakness and lightheadedness for the past 1 week, worse over the past 2 days. Pt states his Home Health RN checked his BP today and "it was low." Pt was then told to come to the ED for evaluation. Pt was seen in the ED last week for same, received IVF NS x2L, and "felt better for a little while." Pt denies new meds/change in meds, no falls, no syncope, no CP/palpitations, no SOB/cough, no abd pain, no N/V/D, no focal motor weakness, no tingling/numbness in extremities.    Past Medical History  Diagnosis Date  . Hypertension   . Hyperlipemia   . Emphysema   . Stroke   . Chronic pain   . Cirrhosis of liver   . Fall   . Dental injury   . Bone spur of other site     Left Foot  . Impaired mobility   . Total self care deficit   . Pneumonia   . Emphysema   . ETOH abuse   . Elevated LFTs   . Arthritis   . Anxiety   . Sinusitis   . Anemia     iron def.  . Hard of hearing   . Diabetes mellitus without complication   . COPD (chronic obstructive pulmonary disease)   . Depression   . Glaucoma   . Emphysema of lung   . Cataract    Past Surgical History  Procedure Laterality Date  . Esophagogastroduodenoscopy  01/09/2012    Procedure: ESOPHAGOGASTRODUODENOSCOPY (EGD);  Surgeon: Beryle Beams, MD;  Location: [facility] ENDOSCOPY;  Service: Endoscopy;  Laterality: N/A;  . Multiple extractions with alveoloplasty  03/29/2012    Procedure: MULTIPLE EXTRACION WITH ALVEOLOPLASTY;  Surgeon: Gae Bon, DDS;  Location: [place];  Service: Oral Surgery;  Laterality: Bilateral;  . Bone spur  2013    left spur  . Cataract extraction w/ intraocular lens implant    . Mouth surgery    . Colonoscopy     Family History   Problem Relation Age of Onset  . Breast cancer Mother   . Diabetes Maternal Aunt   . Heart disease Sister   . Heart disease Maternal Aunt    History  Substance Use Topics  . Smoking status: Current Every Day Smoker -- 1.00 packs/day for 30 years    Types: Cigarettes  . Smokeless tobacco: Never Used  . Alcohol Use: 2.4 oz/week    4 Cans of beer per week     Comment: per pt  2 40oz daily beer. Last drink: 2 hrs ago    Review of Systems ROS: Statement: All systems negative except as marked or noted in the HPI; Constitutional: Negative for fever and chills. +generalized weakness/fatigue, lightheadedness.; ; Eyes: Negative for eye pain, redness and discharge. ; ; ENMT: Negative for ear pain, hoarseness, nasal congestion, sinus pressure and sore throat. ; ; Cardiovascular: Negative for chest pain, palpitations, diaphoresis, dyspnea and peripheral edema. ; ; Respiratory: Negative for cough, wheezing and stridor. ; ; Gastrointestinal: Negative for nausea, vomiting, diarrhea, abdominal pain, blood in stool, hematemesis, jaundice and rectal bleeding. . ; ; Genitourinary: Negative for dysuria, flank pain and hematuria. ; ;  Musculoskeletal: Negative for back pain and neck pain. Negative for swelling and trauma.; ; Skin: Negative for pruritus, rash, abrasions, blisters, bruising and skin lesion.; ; Neuro: Negative for headache, neck stiffness. Negative for altered level of consciousness , altered mental status, extremity weakness, paresthesias, involuntary movement, seizure and syncope.      Allergies  Aspirin and Poison ivy extract  Home Medications   Prior to Admission medications   Medication Sig Start Date End Date Taking? Authorizing Provider  acamprosate (CAMPRAL) 333 MG tablet Take 2 tablets (666 mg total) by mouth 3 (three) times daily with meals. 07/17/14  Yes Lorayne Marek, MD  albuterol (PROVENTIL HFA;VENTOLIN HFA) 108 (90 BASE) MCG/ACT inhaler Inhale 2 puffs into the lungs 4 (four) times  daily. 07/17/14  Yes Lorayne Marek, MD  atorvastatin (LIPITOR) 80 MG tablet Take 1 tablet (80 mg total) by mouth daily. 07/17/14  Yes Deepak Advani, MD  brimonidine (ALPHAGAN) 0.2 % ophthalmic solution Place 1 drop into both eyes 2 (two) times daily. 12/15/13  Yes Lorayne Marek, MD  budesonide-formoterol (SYMBICORT) 80-4.5 MCG/ACT inhaler Inhale 2 puffs into the lungs 2 (two) times daily. 07/17/14  Yes Lorayne Marek, MD  carvedilol (COREG) 6.25 MG tablet Take 1 tablet (6.25 mg total) by mouth 2 (two) times daily with a meal. 11/23/14  Yes Nishant Dhungel, MD  furosemide (LASIX) 40 MG tablet Take 1 tablet (40 mg total) by mouth daily. 07/17/14  Yes Lorayne Marek, MD  gabapentin (NEURONTIN) 300 MG capsule Take 300 mg by mouth 2 (two) times daily.    Yes Historical Provider, MD  lisinopril (PRINIVIL,ZESTRIL) 5 MG tablet Take 1 tablet (5 mg total) by mouth daily. 11/23/14  Yes Nishant Dhungel, MD  pantoprazole (PROTONIX) 40 MG tablet Take 1 tablet (40 mg total) by mouth daily. 07/17/14  Yes Lorayne Marek, MD  tamsulosin (FLOMAX) 0.4 MG CAPS capsule Take 0.4 mg by mouth at bedtime.   Yes Historical Provider, MD  traZODone (DESYREL) 50 MG tablet Take 1 tablet (50 mg total) by mouth at bedtime. 07/17/14  Yes Lorayne Marek, MD  vitamin E 200 UNIT capsule Take 1 capsule (200 Units total) by mouth daily. 02/16/13  Yes Tresa Garter, MD  folic acid (FOLVITE) 1 MG tablet Take 1 tablet (1 mg total) by mouth daily. Patient not taking: Reported on 01/05/2015 07/17/14   Lorayne Marek, MD  ipratropium-albuterol (DUONEB) 0.5-2.5 (3) MG/3ML SOLN Take 3 mLs by nebulization every 4 (four) hours as needed. 05/28/14   Theodis Blaze, MD  Multiple Vitamin (MULTIVITAMIN) tablet Take 1 tablet by mouth daily. Patient not taking: Reported on 12/29/2014 02/16/13   Tresa Garter, MD  nicotine (NICODERM CQ) 21 mg/24hr patch Place 1 patch (21 mg total) onto the skin daily. 12/21/14   Lorayne Marek, MD  potassium chloride SA  (K-DUR,KLOR-CON) 20 MEQ tablet Take 1 tablet (20 mEq total) by mouth daily. Patient not taking: Reported on 12/29/2014 07/17/14   Lorayne Marek, MD  thiamine (VITAMIN B-1) 100 MG tablet Take 1 tablet (100 mg total) by mouth daily. For vitamin B deficiency. Patient not taking: Reported on 12/29/2014 12/15/13   Lorayne Marek, MD  vitamin B-12 (CYANOCOBALAMIN) 100 MCG tablet Take 1 tablet (100 mcg total) by mouth daily. For low B12. Patient not taking: Reported on 12/29/2014 12/15/13   Lorayne Marek, MD   BP 113/55 mmHg  Pulse 78  Temp(Src) 99.6 F (37.6 C) (Rectal)  Resp 24  SpO2 100%  Filed Vitals:   01/05/15 1614 01/05/15 1722  BP: 69/46 90/47  Pulse: 114 77  Temp: 97.7 F (36.5 C)   TempSrc: Oral   Resp: 23 22  SpO2: 100% 100%    18:16 Orthostatic Vital Signs MH  Orthostatic Lying  - BP- Lying: 113/76 mmHg ; Pulse- Lying: 80  Orthostatic Sitting - BP- Sitting: 100/65 mmHg ; Pulse- Sitting: 85  Orthostatic Standing at 0 minutes - BP- Standing at 0 minutes: 98/62 mmHg ; Pulse- Standing at 0 minutes: 89       Physical Exam 1630: Physical examination:  Nursing notes reviewed; Vital signs and O2 SAT reviewed;  Constitutional:  Thin, In no acute distress; Head:  Normocephalic, atraumatic; Eyes: EOMI, PERRL, No scleral icterus; ENMT: Mouth and pharynx normal, Mucous membranes dry; Neck: Supple, Full range of motion, No lymphadenopathy; Cardiovascular: Tachycardic rate and rhythm, No gallop; Respiratory: Breath sounds clear & equal bilaterally, No wheezes.  Speaking full sentences with ease, Normal respiratory effort/excursion; Chest: Nontender, Movement normal; Abdomen: Soft, Nontender, Nondistended, Normal bowel sounds; Genitourinary: No CVA tenderness; Extremities: Pulses normal, No tenderness, No edema, No calf edema or asymmetry.; Neuro: AA&Ox3, Major CN grossly intact.  Speech clear. No gross focal motor or sensory deficits in extremities.; Skin: Color normal, Warm, Dry.    ED Course   Procedures      EKG Interpretation   Date/Time:  Friday January 05 2015 16:15:53 EDT Ventricular Rate:  100 PR Interval:  150 QRS Duration: 86 QT Interval:  370 QTC Calculation: 477 R Axis:   66 Text Interpretation:  Sinus tachycardia Borderline prolonged QT interval  Baseline wander When compared with ECG of 12/29/2014 No significant change  was found Confirmed by [facility]  MD, Nunzio Cory [phone]) on 01/05/2015 5:30:47  PM      MDM  MDM Reviewed: previous chart, nursing note and vitals Reviewed previous: labs and ECG Interpretation: labs, ECG and x-ray Total time providing critical care: 30-74 minutes. This excludes time spent performing separately reportable procedures and services. Consults: admitting MD     CRITICAL CARE Performed by: Alfonzo Feller Total critical care time: 35 Critical care time was exclusive of separately billable procedures and treating other patients. Critical care was necessary to treat or prevent imminent or life-threatening deterioration. Critical care was time spent personally by me on the following activities: development of treatment plan with patient and/or surrogate as well as nursing, discussions with consultants, evaluation of patient's response to treatment, examination of patient, obtaining history from patient or surrogate, ordering and performing treatments and interventions, ordering and review of laboratory studies, ordering and review of radiographic studies, pulse oximetry and re-evaluation of patient's condition.  Results for orders placed or performed during the hospital encounter of 01/05/15  Comprehensive metabolic panel  Result Value Ref Range   Sodium 136 135 - 145 mmol/L   Potassium 3.3 (L) 3.5 - 5.1 mmol/L   Chloride 99 (L) 101 - 111 mmol/L   CO2 25 22 - 32 mmol/L   Glucose, Bld 72 65 - 99 mg/dL   BUN 21 (H) 6 - 20 mg/dL   Creatinine, Ser 1.86 (H) 0.61 - 1.24 mg/dL   Calcium 9.3 8.9 - 10.3 mg/dL   Total Protein 8.0 6.5 -  8.1 g/dL   Albumin 3.9 3.5 - 5.0 g/dL   AST 16 15 - 41 U/L   ALT 8 (L) 17 - 63 U/L   Alkaline Phosphatase 59 38 - 126 U/L   Total Bilirubin 0.4 0.3 - 1.2 mg/dL   GFR calc non Af Amer 37 (L) >  60 mL/min   GFR calc Af Amer 43 (L) >60 mL/min   Anion gap 12 5 - 15  Ethanol  Result Value Ref Range   Alcohol, Ethyl (B) <5 <5 mg/dL  CBC with Differential  Result Value Ref Range   WBC 4.2 4.0 - 10.5 K/uL   RBC 3.88 (L) 4.22 - 5.81 MIL/uL   Hemoglobin 9.7 (L) 13.0 - 17.0 g/dL   HCT 30.6 (L) 39.0 - 52.0 %   MCV 78.9 78.0 - 100.0 fL   MCH 25.0 (L) 26.0 - 34.0 pg   MCHC 31.7 30.0 - 36.0 g/dL   RDW 14.8 11.5 - 15.5 %   Platelets 343 150 - 400 K/uL   Neutrophils Relative % 45 43 - 77 %   Neutro Abs 1.9 1.7 - 7.7 K/uL   Lymphocytes Relative 39 12 - 46 %   Lymphs Abs 1.7 0.7 - 4.0 K/uL   Monocytes Relative 14 (H) 3 - 12 %   Monocytes Absolute 0.6 0.1 - 1.0 K/uL   Eosinophils Relative 1 0 - 5 %   Eosinophils Absolute 0.1 0.0 - 0.7 K/uL   Basophils Relative 1 0 - 1 %   Basophils Absolute 0.0 0.0 - 0.1 K/uL  Urinalysis, Routine w reflex microscopic (not at [facility])  Result Value Ref Range   Color, Urine AMBER (A) YELLOW   APPearance CLOUDY (A) CLEAR   Specific Gravity, Urine 1.013 1.005 - 1.030   pH 5.5 5.0 - 8.0   Glucose, UA NEGATIVE NEGATIVE mg/dL   Hgb urine dipstick NEGATIVE NEGATIVE   Bilirubin Urine SMALL (A) NEGATIVE   Ketones, ur NEGATIVE NEGATIVE mg/dL   Protein, ur NEGATIVE NEGATIVE mg/dL   Urobilinogen, UA 0.2 0.0 - 1.0 mg/dL   Nitrite NEGATIVE NEGATIVE   Leukocytes, UA LARGE (A) NEGATIVE  Urine microscopic-add on  Result Value Ref Range   Squamous Epithelial / LPF MANY (A) RARE   WBC, UA TOO NUMEROUS TO COUNT <3 WBC/hpf   Bacteria, UA MANY (A) RARE  I-stat troponin, ED  Result Value Ref Range   Troponin i, poc 0.00 0.00 - 0.08 ng/mL   Comment 3          I-Stat CG4 Lactic Acid, ED  Result Value Ref Range   Lactic Acid, Venous 1.70 0.5 - 2.0 mmol/L   Dg Chest Port 1  View 01/05/2015   CLINICAL DATA:  Weakness and dizziness for 1 week. Emphysema. COPD. Ethanol abuse.  EXAM: PORTABLE CHEST - 1 VIEW  COMPARISON:  12/29/2014  FINDINGS: Remote right rib and left clavicular fractures. Midline trachea. Normal heart size. Atherosclerosis in the transverse aorta. No pleural effusion or pneumothorax. Diffuse peribronchial thickening. No lobar consolidation.  IMPRESSION: COPD/chronic bronchitis.  No acute superimposed process.  Aortic atherosclerosis.   Electronically Signed   By: Abigail Miyamoto M.D.   On: 01/05/2015 17:00    Results for TORIAN, QUINTERO (MRN [id number]) as of 01/05/2015 18:34  Ref. Range 11/20/2014 05:17 11/21/2014 05:35 12/21/2014 11:36 12/29/2014 15:41 01/05/2015 17:07  BUN Latest Ref Range: 6-20 mg/dL 6 9 7 10 21  (H)  Creatinine Latest Ref Range: 0.61-1.24 mg/dL 0.78 0.87 0.94 1.41 (H) 1.86 (H)    1830:  Judicious IVF given with slow improvement in BP. BUN/Cr trending upward over the past 2 months, worse over the past 3 weeks. H/H per baseline. +UTI, UC pending; will dose IV rocephin. Lactic acid normal and WBC count normal.  Dx and testing d/w pt and family.  Questions answered.  Verb understanding, agreeable to admit.  T/C to [facility]. Roel Cluck, case discussed, including:  HPI, pertinent PM/SHx, VS/PE, dx testing, ED course and treatment:  Agreeable to admit, requests she will come to the ED for evaluation.     Francine Graven, DO 01/08/15 1735

## 2015-01-06 DIAGNOSIS — N390 Urinary tract infection, site not specified: Principal | ICD-10-CM

## 2015-01-06 DIAGNOSIS — I951 Orthostatic hypotension: Secondary | ICD-10-CM

## 2015-01-06 DIAGNOSIS — E876 Hypokalemia: Secondary | ICD-10-CM

## 2015-01-06 DIAGNOSIS — N179 Acute kidney failure, unspecified: Secondary | ICD-10-CM

## 2015-01-06 LAB — COMPREHENSIVE METABOLIC PANEL
ALBUMIN: 3.4 g/dL — AB (ref 3.5–5.0)
ALT: 6 U/L — AB (ref 17–63)
ANION GAP: 8 (ref 5–15)
AST: 11 U/L — AB (ref 15–41)
Alkaline Phosphatase: 50 U/L (ref 38–126)
BILIRUBIN TOTAL: 0.3 mg/dL (ref 0.3–1.2)
BUN: 14 mg/dL (ref 6–20)
CO2: 23 mmol/L (ref 22–32)
Calcium: 8.9 mg/dL (ref 8.9–10.3)
Chloride: 110 mmol/L (ref 101–111)
Creatinine, Ser: 1.03 mg/dL (ref 0.61–1.24)
GFR calc Af Amer: >60 mL/min (ref >60)
GFR calc non Af Amer: >60 mL/min (ref >60)
GLUCOSE: 93 mg/dL (ref 65–99)
Potassium: 3.3 mmol/L — ABNORMAL LOW (ref 3.5–5.1)
Sodium: 141 mmol/L (ref 135–145)
Total Protein: 6.5 g/dL (ref 6.5–8.1)

## 2015-01-06 LAB — CBC
HCT: 27.5 % — ABNORMAL LOW (ref 39.0–52.0)
Hemoglobin: 8.8 g/dL — ABNORMAL LOW (ref 13.0–17.0)
MCH: 25.1 pg — AB (ref 26.0–34.0)
MCHC: 32 g/dL (ref 30.0–36.0)
MCV: 78.6 fL (ref 78.0–100.0)
Platelets: 349 10*3/uL (ref 150–400)
RBC: 3.5 MIL/uL — AB (ref 4.22–5.81)
RDW: 14.6 % (ref 11.5–15.5)
WBC: 3.8 10*3/uL — AB (ref 4.0–10.5)

## 2015-01-06 LAB — PROTIME-INR
INR: 1.17 (ref 0.00–1.49)
PROTHROMBIN TIME: 15.1 s (ref 11.6–15.2)

## 2015-01-06 LAB — CORTISOL-AM, BLOOD: CORTISOL - AM: 8.9 ug/dL (ref 6.7–22.6)

## 2015-01-06 LAB — SODIUM, URINE, RANDOM: SODIUM UR: 29 mmol/L

## 2015-01-06 LAB — TSH: TSH: 0.564 u[IU]/mL (ref 0.350–4.500)

## 2015-01-06 LAB — MAGNESIUM: Magnesium: 1.3 mg/dL — ABNORMAL LOW (ref 1.7–2.4)

## 2015-01-06 LAB — PHOSPHORUS: Phosphorus: 4.2 mg/dL (ref 2.5–4.6)

## 2015-01-06 LAB — PREALBUMIN: Prealbumin: 18.1 mg/dL (ref 18–38)

## 2015-01-06 LAB — CREATININE, URINE, RANDOM: CREATININE, URINE: 193.16 mg/dL

## 2015-01-06 MED ORDER — POTASSIUM CHLORIDE CRYS ER 20 MEQ PO TBCR
40.0000 meq | EXTENDED_RELEASE_TABLET | Freq: Once | ORAL | Status: AC
  Administered 2015-01-06: 40 meq via ORAL
  Filled 2015-01-06: qty 2

## 2015-01-06 MED ORDER — ENOXAPARIN SODIUM 40 MG/0.4ML ~~LOC~~ SOLN
40.0000 mg | SUBCUTANEOUS | Status: DC
  Administered 2015-01-06 – 2015-01-07 (×2): 40 mg via SUBCUTANEOUS
  Filled 2015-01-06 (×2): qty 0.4

## 2015-01-06 MED ORDER — MAGNESIUM SULFATE 2 GM/50ML IV SOLN
2.0000 g | Freq: Once | INTRAVENOUS | Status: AC
  Administered 2015-01-06: 2 g via INTRAVENOUS
  Filled 2015-01-06: qty 50

## 2015-01-06 MED ORDER — ACAMPROSATE CALCIUM 333 MG PO TBEC
666.0000 mg | DELAYED_RELEASE_TABLET | Freq: Three times a day (TID) | ORAL | Status: DC
  Administered 2015-01-06 – 2015-01-08 (×6): 666 mg via ORAL
  Filled 2015-01-06 (×8): qty 2

## 2015-01-06 NOTE — Progress Notes (Signed)
PT Cancellation Note  Patient Details Name: Peter [name] MRN: [id number] DOB: 1950/07/22   Cancelled Treatment:    Reason Eval/Treat Not Completed: Medical issues which prohibited therapy (significantly orthostatic--see flowsheets); will attempt again next day or as schedule permits   [facility] 01/06/2015, 1:30 PM

## 2015-01-06 NOTE — Progress Notes (Signed)
Patient ID: Peter [name], male   DOB: Apr 05, 1951, 64 y.o.   MRN: [id number] [facility] PROGRESS NOTE  STEED KANAAN BTD:[id number] DOB: [birth year] DOA: 25-Jan-2015 PCP: Lorayne Marek, MD  Brief narrative:    64 y.o. male with past medical history of alcohol abuse, chronic diastolic and systolic CHF (last 2 D ECHO in 11/2014 with EF of 50% with grade 1 diastolic dysfunction), emphysema, liver cirrhosis, chronic pain, dyslipidemia, hypertension who presented to [facility] ED with persistent weakness and lightheadedness over past 1 week prior to this admission. HHRN was visiting the pt and noted that his BP was low and has referred him to ED for evaluation.   In ED, pt was hypotensive with BP as low as 69/46 which has improved with IV fluids. He was found to have large leukocytes and many bacteria on urinalysis. He was given rocephin in ED. His UDS was normal and alcohol level was WNL.  Anticipated discharge: 01/08/2015  Assessment/Plan:    Principal Problem: Lightheadedness / hypotension - Lightheadedness likely from hypotension.  - Pt is on few BP meds: lasix, coreg, lisinopril which could have been too much for him and led to hypotension - He has had recent 2 D ECHO in 11/2014 with EF of 55% and grade 1 DD - May stop IV fluids today, pt tolerates regular diet - BP is 112/59 - Hold all BP meds  Active Problems: COPD (chronic obstructive pulmonary disease) - Stable - Not in acute exacerbation  Hypokalemia - Due to lasix - Supplemented  UTI (urinary tract infection) - UA on admission with large leukocytes and many bacteria - Started on empiric Rocephin - Follow up urine culture results   Anemia of chronic disease / leukopenia - Due to bone marrow suppression from alcohol abuse - Hemoglobin is 8.8, WBC count 3.8 - Stable   Chronic alcohol abuse - Normal UDS and ethanol level - No reports of withdrawals - Continue folic acid, thiamine and multivitamin  Tobacco abuse -  Counseled on cessation  Malnutrition of moderate degree - In the context of chronic illness - Tolerates regular diet  ARF (acute renal failure) - Possibly form lasix, lisinopril which were both held on admission - Creatinine improved with IV fluids    DVT Prophylaxis  - Lovenox sub Q ordered    Code Status: Full.  Family Communication:  plan of care discussed with the patient Disposition Plan: Home by 01/08/2015.    IV access:  Peripheral IV  Procedures and diagnostic studies:    Dg Chest Port 1 View January 25, 2015   COPD/chronic bronchitis.  No acute superimposed process.  Aortic atherosclerosis.    Medical Consultants:  None   Other Consultants:  None   IAnti-Infectives:   Rocephin Jan 25, 2015 -->   DEVINE, ALMA, MD  [facility] Pager [phone]  Time spent in minutes: 25 minutes  If 7PM-7AM, please contact night-coverage [email or web address] Password [email or web address] 01/06/2015, 1:28 PM   LOS: 1 day    HPI/Subjective: No acute overnight events. Patient reports no nausea.  Objective: Filed Vitals:   25-Jan-2015 2346 01/06/15 0351 01/06/15 0842 01/06/15 1005  BP: 123/60 122/75  112/59  Pulse: 82 86  80  Temp: 98.6 F (37 C) 98.7 F (37.1 C)  98 F (36.7 C)  TempSrc: Oral Oral  Oral  Resp: 20 18  20   Height:      Weight:      SpO2: 99% 100% 98% 100%    Intake/Output Summary (Last 24 hours) at 01/06/15 1328 Last  data filed at 01/06/15 1313  Gross per 24 hour  Intake 1381.25 ml  Output    525 ml  Net 856.25 ml    Exam:   General:  Pt is alert, follows commands appropriately, not in acute distress  Cardiovascular: Regular rate and rhythm, S1/S2 (+)  Respiratory: Clear to auscultation bilaterally, no wheezing, no crackles, no rhonchi  Abdomen:  distended, bowel sounds present  Extremities: No edema, pulses DP and PT palpable bilaterally  Neuro: Grossly nonfocal  Data Reviewed: Basic Metabolic Panel:  Recent Labs Lab 01/05/15 1707 01/05/15 1716  01/06/15 0514  NA 136  --  141  K 3.3*  --  3.3*  CL 99*  --  110  CO2 25  --  23  GLUCOSE 72  --  93  BUN 21*  --  14  CREATININE 1.86*  --  1.03  CALCIUM 9.3  --  8.9  MG  --  1.3* 1.3*  PHOS  --  4.1 4.2   Liver Function Tests:  Recent Labs Lab 01/05/15 1707 01/06/15 0514  AST 16 11*  ALT 8* 6*  ALKPHOS 59 50  BILITOT 0.4 0.3  PROT 8.0 6.5  ALBUMIN 3.9 3.4*   No results for input(s): LIPASE, AMYLASE in the last 168 hours. No results for input(s): AMMONIA in the last 168 hours. CBC:  Recent Labs Lab 01/05/15 1707 01/06/15 0514  WBC 4.2 3.8*  NEUTROABS 1.9  --   HGB 9.7* 8.8*  HCT 30.6* 27.5*  MCV 78.9 78.6  PLT 343 349   Cardiac Enzymes: No results for input(s): CKTOTAL, CKMB, CKMBINDEX, TROPONINI in the last 168 hours. BNP: Invalid input(s): POCBNP CBG: No results for input(s): GLUCAP in the last 168 hours.  Recent Results (from the past 240 hour(s))  Urine culture     Status: None (Preliminary result)   Collection Time: 01/05/15  6:07 PM  Result Value Ref Range Status   Specimen Description URINE, CLEAN CATCH  Final   Special Requests NONE  Final   Culture   Final    CULTURE REINCUBATED FOR BETTER GROWTH Performed at [facility]    Report Status PENDING  Incomplete     Scheduled Meds: . acamprosate  666 mg Oral TID WC  . atorvastatin  80 mg Oral q1800  . budesonide-formoterol  2 puff Inhalation BID  . carvedilol  3.125 mg Oral BID WC  . cefTRIAXone  1 g Intravenous Q24H  . enoxaparin (LOVENOX)   40 mg Subcutaneous Q24H  . folic acid  1 mg Oral Daily  . gabapentin  300 mg Oral BID  . multivitamin with minerals  1 tablet Oral Daily  . nicotine  21 mg Transdermal Daily  . pantoprazole  40 mg Oral Daily  . tamsulosin  0.4 mg Oral QHS  . thiamine  100 mg Oral Daily  . traZODone  50 mg Oral QHS   Continuous Infusions: . sodium chloride 75 mL/hr at 01/06/15 1222

## 2015-01-07 DIAGNOSIS — F1010 Alcohol abuse, uncomplicated: Secondary | ICD-10-CM

## 2015-01-07 LAB — BASIC METABOLIC PANEL
Anion gap: 9 (ref 5–15)
BUN: 10 mg/dL (ref 6–20)
CHLORIDE: 108 mmol/L (ref 101–111)
CO2: 24 mmol/L (ref 22–32)
Calcium: 9.3 mg/dL (ref 8.9–10.3)
Creatinine, Ser: 0.85 mg/dL (ref 0.61–1.24)
GFR calc Af Amer: >60 mL/min (ref >60)
Glucose, Bld: 93 mg/dL (ref 65–99)
Potassium: 3.9 mmol/L (ref 3.5–5.1)
SODIUM: 141 mmol/L (ref 135–145)

## 2015-01-07 LAB — MAGNESIUM: MAGNESIUM: 1.4 mg/dL — AB (ref 1.7–2.4)

## 2015-01-07 LAB — GLUCOSE, CAPILLARY: Glucose-Capillary: 77 mg/dL (ref 65–99)

## 2015-01-07 NOTE — Progress Notes (Signed)
Initial Nutrition Assessment  INTERVENTION:  Place pt on Meal order w/ assist Encourage PO intake  NUTRITION DIAGNOSIS:  Increased nutrient needs related to other (see comment) (Hx of ETOH abuse) as evidenced by estimated needs.  GOAL:  Patient will meet greater than or equal to 90% of their needs  MONITOR:  PO intake, Labs, Weight trends, Skin, I & O's  REASON FOR ASSESSMENT:  Consult Assessment of nutrition requirement/status  ASSESSMENT: 64 y.o. male with past medical history of alcohol abuse, chronic diastolic and systolic CHF, emphysema, liver cirrhosis, chronic pain, dyslipidemia, hypertension who presented to [facility] ED with persistent weakness and lightheadedness over past 1 week prior to this admission.   Pt reports UBW is 143 lb, wt stable. Pt reports he has been eating normally PTA. Pt denies any appetite changes. Pt does state he has store-brand Ensure supplements at home to drink when he needs them. Declines supplements at this time.  Pt states that he is having a hard time reading the menu to order meals. RD to place pt on meal order with assist to ensure pt orders meal. Pt is eating 100% of Heart Health diet.  Nutrition-Focused physical exam completed. Findings are no fat depletion, mild muscle depletion, and no edema.   Labs reviewed: Low Mg  Height:  Ht Readings from Last 1 Encounters:  01/05/15 5\' 7"  (1.702 m)    Weight:  Wt Readings from Last 1 Encounters:  01/05/15 142 lb 13.7 oz (64.8 kg)    Ideal Body Weight:  67.3 kg  Wt Readings from Last 10 Encounters:  01/05/15 142 lb 13.7 oz (64.8 kg)  12/29/14 153 lb (69.4 kg)  12/21/14 149 lb 3.2 oz (67.677 kg)  11/18/14 138 lb 8 oz (62.823 kg)  07/17/14 149 lb (67.586 kg)  05/31/14 143 lb 4.8 oz (65 kg)  12/15/13 139 lb 12.8 oz (63.413 kg)  11/03/13 154 lb 14.4 oz (70.262 kg)  08/01/13 154 lb 1.6 oz (69.9 kg)  06/07/13 149 lb 9.6 oz (67.858 kg)    BMI:  Body mass index is 22.37  kg/(m^2).  Estimated Nutritional Needs:  Kcal:  1800-2000  Protein:  85-95g  Fluid:  1.8L/day    Skin:  Reviewed, no issues  Diet Order:  Diet Heart Room service appropriate?: Yes with Assist; Fluid consistency:: Thin  EDUCATION NEEDS:  No education needs identified at this time   Intake/Output Summary (Last 24 hours) at 01/07/15 1229 Last data filed at 01/07/15 1109  Gross per 24 hour  Intake    600 ml  Output   2350 ml  Net  -1750 ml    Last BM:  7/2  Clayton Bibles, MS, RD, LDN Pager: [phone] After Hours Pager: [phone]

## 2015-01-07 NOTE — Progress Notes (Signed)
Occupational Therapy Evaluation Patient Details Name: Peter [name] MRN: [id number] DOB: 03-22-1951 Today's Date: 01/07/2015    History of Present Illness Peter [name] is a 64 y.o. male adm after feeling unwell with dizziness.   Clinical Impression   Patient presents to OT with decreased ADL independence and safety due to the deficits listed below. Will benefit from skilled OT to maximize independence. OT will follow.    Follow Up Recommendations  Home health OT;Supervision/Assistance - 24 hour    Equipment Recommendations  None recommended by OT    Recommendations for Other Services PT consult     Precautions / Restrictions Precautions Precautions: Fall Restrictions Weight Bearing Restrictions: No      Mobility Bed Mobility Overal bed mobility: Needs Assistance Bed Mobility: Supine to Sit;Sit to Supine     Supine to sit: Supervision Sit to supine: Supervision      Transfers Overall transfer level: Needs assistance Equipment used: None Transfers: Sit to/from Stand Sit to Stand: Min guard              Balance                                            ADL Overall ADL's : Needs assistance/impaired Eating/Feeding: Set up;Bed level   Grooming: Set up;Bed level   Upper Body Bathing: Minimal assitance;Bed level   Lower Body Bathing: Minimal assistance;Bed level   Upper Body Dressing : Minimal assistance;Bed level   Lower Body Dressing: Minimal assistance;Bed level                 General ADL Comments: Patient in bed upon arrival. Performed orthostatic BPs. Supine: 129/67, 100% SpO2 on room air, 81 bpm. Sitting EOB: BP 119/68, 100% SpO2, 84 bpm. Standing: 89/61, 100% SpO2 on room air, HR 119 bpm. Patient reports "feeling funny" upon standing. Further mobility deferred and patient returned to bed. Able to reach to feet to adjust socks from seated position. Toileting not assessed. Bed mobility supervision.     Vision      Perception     Praxis      Pertinent Vitals/Pain Pain Assessment: No/denies pain (Simultaneous filing. User may not have seen previous data.)     Hand Dominance Left   Extremity/Trunk Assessment Upper Extremity Assessment Upper Extremity Assessment: Overall WFL for tasks assessed   Lower Extremity Assessment Lower Extremity Assessment: RLE deficits/detail RLE Deficits / Details: 3+/5   Cervical / Trunk Assessment Cervical / Trunk Assessment: Normal   Communication Communication Communication: No difficulties   Cognition Arousal/Alertness: Awake/alert Behavior During Therapy: WFL for tasks assessed/performed Overall Cognitive Status: Within Functional Limits for tasks assessed                     General Comments       Exercises       Shoulder Instructions      Home Living Family/patient expects to be discharged to:: Private residence Living Arrangements: Other relatives Available Help at Discharge: Available PRN/intermittently Type of Home: House Home Access: Stairs to enter [employer] of Steps: 4 Entrance Stairs-Rails: None Home Layout: One level     Bathroom Shower/Tub: Corporate investment banker: Standard Bathroom Accessibility: Yes How Accessible: Accessible via walker Home Equipment: Carrick - 2 wheels;Cane - single point;Tub bench          Prior Functioning/Environment  Level of Independence: Independent with assistive device(s)        Comments: Pt states he has been using RW or cane since non-operative R hip fx s/p ~35month    OT Diagnosis: Generalized weakness   OT Problem List: Decreased activity tolerance;Cardiopulmonary status limiting activity   OT Treatment/Interventions: Self-care/ADL training;DME and/or AE instruction;Therapeutic activities;Patient/family education;Therapeutic exercise    OT Goals(Current goals can be found in the care plan section) Acute Rehab OT Goals Patient Stated Goal: to  get out of here OT Goal Formulation: With patient Time For Goal Achievement: 01/21/15 Potential to Achieve Goals: Good ADL Goals Pt Will Perform Upper Body Bathing: with modified independence Pt Will Perform Lower Body Bathing: with modified independence Pt Will Perform Upper Body Dressing: with modified independence Pt Will Perform Lower Body Dressing: with modified independence Pt Will Transfer to Toilet: with modified independence Pt Will Perform Toileting - Clothing Manipulation and hygiene: with modified independence Pt Will Perform Tub/Shower Transfer: with modified independence  OT Frequency: Min 2X/week   Barriers to D/C:            Co-evaluation              End of Session    Activity Tolerance: Treatment limited secondary to medical complications (Comment) ((orthostatic hypotension)) Patient left: in bed;with call bell/phone within reach;with bed alarm set   Time: 2641-5830 OT Time Calculation (min): 19 min Charges:  OT General Charges $OT Visit: 1 Procedure OT Evaluation $Initial OT Evaluation Tier I: 1 Procedure G-Codes:    [name] A February 01, 2015, 12:05 PM

## 2015-01-07 NOTE — Evaluation (Signed)
Physical Therapy Evaluation Patient Details Name: Peter [name] MRN: [id number] DOB: 1951/02/07 Today's Date: 01/07/2015   History of Present Illness  Peter [name] is a 64 y.o. male adm after feeling unwell with dizziness.  Pt reports recent non-operative R hip fx s/p ~20month  Clinical Impression  Pt admitted as above and presenting with functional mobility limitations 2* R LE weakness, ambulatory instability and orthostatic BP.  BP this session supine: 123/65 with HR 87; sit 117/65 HR 96, sit x 3 min 104/62 HR 95, stand 88/47 HR 126; after transfer to recliner 114/74 HR 92.  Pt will benefit from acute stay PT to maximize IND and safety prior to return home and follow up HHPT dependent on acute stay progress..   Follow Up Recommendations Home health PT    Equipment Recommendations  None recommended by PT    Recommendations for Other Services OT consult     Precautions / Restrictions Precautions Precautions: Fall Restrictions Weight Bearing Restrictions: No      Mobility  Bed Mobility Overal bed mobility: Needs Assistance Bed Mobility: Supine to Sit     Supine to sit: Supervision Sit to supine: Supervision      Transfers Overall transfer level: Needs assistance Equipment used: Rolling walker (2 wheeled) Transfers: Sit to/from Stand Sit to Stand: Min guard         General transfer comment: cues for use of UEs to self assist  Ambulation/Gait Ambulation/Gait assistance: Min guard Ambulation Distance (Feet): 5 Feet Assistive device: Rolling walker (2 wheeled) Gait Pattern/deviations: Step-to pattern;Decreased step length - right;Decreased step length - left;Shuffle     General Gait Details: cues for posture and position from RW - pt bed to recliner only 2* orthostatic BP  Stairs            Wheelchair Mobility    Modified Rankin (Stroke Patients Only)       Balance Overall balance assessment: Needs assistance Sitting-balance support: Feet  supported Sitting balance-Leahy Scale: Good     Standing balance support: No upper extremity supported Standing balance-Leahy Scale: Fair                               Pertinent Vitals/Pain Pain Assessment: No/denies pain (Simultaneous filing. User may not have seen previous data.) Faces Pain Scale: Hurts little more Pain Location: R hip with ROM/strength testing Pain Intervention(s): Limited activity within patient's tolerance;Monitored during session    [place] expects to be discharged to:: Private residence Living Arrangements: Other relatives Available Help at Discharge: Available PRN/intermittently Type of Home: House Home Access: Stairs to enter Entrance Stairs-Rails: None Entrance Stairs-Number of Steps: 4 Home Layout: One level Home Equipment: Environmental consultant - 2 wheels;Cane - single point;Tub bench      Prior Function Level of Independence: Independent with assistive device(s)         Comments: Pt states he has been using RW or cane since non-operative R hip fx s/p ~94month     Hand Dominance   Dominant Hand: Left    Extremity/Trunk Assessment   Upper Extremity Assessment: Overall WFL for tasks assessed           Lower Extremity Assessment: RLE deficits/detail RLE Deficits / Details: AROM WFL with strength~ 3+/5    Cervical / Trunk Assessment: Normal  Communication   Communication: No difficulties  Cognition Arousal/Alertness: Awake/alert Behavior During Therapy: WFL for tasks assessed/performed Overall Cognitive Status: Within Functional Limits for tasks  assessed                      General Comments      Exercises General Exercises - Lower Extremity Ankle Circles/Pumps: AROM;Both;20 reps;Supine      Assessment/Plan    PT Assessment Patient needs continued PT services  PT Diagnosis Difficulty walking   PT Problem List Decreased strength;Decreased activity tolerance;Decreased balance;Decreased  mobility;Decreased knowledge of use of DME;Pain;Other (comment)  PT Treatment Interventions DME instruction;Gait training;Stair training;Functional mobility training;Therapeutic activities;Therapeutic exercise;Patient/family education   PT Goals (Current goals can be found in the Care Plan section) Acute Rehab PT Goals Patient Stated Goal: to get out of here PT Goal Formulation: With patient Time For Goal Achievement: 01/21/15 Potential to Achieve Goals: Good    Frequency Min 3X/week   Barriers to discharge        Co-evaluation               End of Session Equipment Utilized During Treatment: Gait belt Activity Tolerance: Other (comment) (orthostatic BP) Patient left: in chair;with call bell/phone within reach;with chair alarm set Nurse Communication: Mobility status         Time: 7588-3254 PT Time Calculation (min) (ACUTE ONLY): 25 min   Charges:   PT Evaluation $Initial PT Evaluation Tier I: 1 Procedure PT Treatments $Gait Training: 8-22 mins   PT G Codes:        Ludmilla Mcgillis 2015-01-30, 12:10 PM

## 2015-01-07 NOTE — Progress Notes (Signed)
Patient ID: Peter [name], male   DOB: 06/22/1951, 64 y.o.   MRN: [id number] [facility] PROGRESS NOTE  Peter [name]:[id number] DOB: 05-Apr-1951 DOA: January 08, 2015 PCP: Lorayne Marek, MD  Brief narrative:    64 y.o. male with past medical history of alcohol abuse, chronic diastolic and systolic CHF (last 2 D ECHO in 11/2014 with EF of 50% with grade 1 diastolic dysfunction), emphysema, liver cirrhosis, chronic pain, dyslipidemia, hypertension who presented to [facility] ED with persistent weakness and lightheadedness over past 1 week prior to this admission. HHRN was visiting the pt and noted that his BP was low and has referred him to ED for evaluation.   In ED, pt was hypotensive with BP as low as 69/46 which has improved with IV fluids. He was found to have large leukocytes and many bacteria on urinalysis. He was given rocephin in ED. His UDS was normal and alcohol level was WNL.  Anticipated discharge: 01/08/2015. Urine culture growing GNR, will follow up on final result and sens report.   Assessment/Plan:    Principal Problem: Lightheadedness / hypotension - Lightheadedness likely from hypotension.  - Current BP  114/74 while off of blood pressure meds - Per PT eval - HHP (order placed)   Active Problems: COPD (chronic obstructive pulmonary disease) - Stable - Not in acute exacerbation  Hypokalemia - Due to lasix - Supplemented  UTI (urinary tract infection) due to gram negative rods  - UA on admission showed large leukocytes and many bacteria - Continue Rocephin - Urine culture growing gram negative rods   Anemia of chronic disease / leukopenia - Due to bone marrow suppression from alcohol abuse - Hemoglobin is 8.8, WBC count 3.8 - Stable   Chronic alcohol abuse - Normal UDS and ethanol level - No reports of withdrawals - Continue folic acid, thiamine and multivitamin  Tobacco abuse - Counseled on smoking cessation  Malnutrition of moderate degree - In the context  of chronic illness - Tolerates regular diet  ARF (acute renal failure) - Likely secondary to lasix, lisinopril which were both held on admission - Will continue to hold those meds due to soft BP - Creatinine normalized with IV fluids    DVT Prophylaxis  - Lovenox sub Q ordered    Code Status: Full.  Family Communication:  plan of care discussed with the patient Disposition Plan: Home by 01/08/2015.    IV access:  Peripheral IV  Procedures and diagnostic studies:    Dg Chest Port 1 View 01/08/15   COPD/chronic bronchitis.  No acute superimposed process.  Aortic atherosclerosis.    Medical Consultants:  None   Other Consultants:  None   IAnti-Infectives:   Rocephin Jan 08, 2015 -->   Warnell Rasnic, MD  [facility] Pager [phone]  Time spent in minutes: 25 minutes  If 7PM-7AM, please contact night-coverage [email or web address] Password [email or web address] 01/07/2015, 12:50 PM   LOS: 2 days    HPI/Subjective: No acute overnight events. Patient reports no nausea.  Objective: Filed Vitals:   01/07/15 0932 01/07/15 0935 01/07/15 0937 01/07/15 0940  BP: 117/65 104/62 88/47 114/74  Pulse: 96 95 126   Temp:      TempSrc:      Resp:      Height:      Weight:      SpO2:        Intake/Output Summary (Last 24 hours) at 01/07/15 1250 Last data filed at 01/07/15 1109  Gross per 24 hour  Intake    600  ml  Output   2350 ml  Net  -1750 ml    Exam:   General:  Pt is alert, follows commands appropriately, not in acute distress  Cardiovascular: Regular rate and rhythm, S1/S2 (+)  Respiratory: Clear to auscultation bilaterally, no wheezing, no crackles, no rhonchi  Abdomen:  distended, bowel sounds present  Extremities: No edema, pulses DP and PT palpable bilaterally  Neuro: Grossly nonfocal  Data Reviewed: Basic Metabolic Panel:  Recent Labs Lab 01/05/15 1707 01/05/15 1716 01/06/15 0514 01/07/15 0517  NA 136  --  141 141  K 3.3*  --  3.3* 3.9  CL 99*  --  110 108   CO2 25  --  23 24  GLUCOSE 72  --  93 93  BUN 21*  --  14 10  CREATININE 1.86*  --  1.03 0.85  CALCIUM 9.3  --  8.9 9.3  MG  --  1.3* 1.3* 1.4*  PHOS  --  4.1 4.2  --    Liver Function Tests:  Recent Labs Lab 01/05/15 1707 01/06/15 0514  AST 16 11*  ALT 8* 6*  ALKPHOS 59 50  BILITOT 0.4 0.3  PROT 8.0 6.5  ALBUMIN 3.9 3.4*   No results for input(s): LIPASE, AMYLASE in the last 168 hours. No results for input(s): AMMONIA in the last 168 hours. CBC:  Recent Labs Lab 01/05/15 1707 01/06/15 0514  WBC 4.2 3.8*  NEUTROABS 1.9  --   HGB 9.7* 8.8*  HCT 30.6* 27.5*  MCV 78.9 78.6  PLT 343 349   Cardiac Enzymes: No results for input(s): CKTOTAL, CKMB, CKMBINDEX, TROPONINI in the last 168 hours. BNP: Invalid input(s): POCBNP CBG:  Recent Labs Lab 01/07/15 0729  GLUCAP 77    Recent Results (from the past 240 hour(s))  Urine culture     Status: None (Preliminary result)   Collection Time: 01/05/15  6:07 PM  Result Value Ref Range Status   Specimen Description URINE, CLEAN CATCH  Final   Special Requests NONE  Final   Culture   Final    >=100,000 COLONIES/mL GRAM NEGATIVE RODS Performed at [facility]    Report Status PENDING  Incomplete     Scheduled Meds: . acamprosate  666 mg Oral TID WC  . atorvastatin  80 mg Oral q1800  . budesonide-formoterol  2 puff Inhalation BID  . carvedilol  3.125 mg Oral BID WC  . cefTRIAXone  1 g Intravenous Q24H  . enoxaparin (LOVENOX)   40 mg Subcutaneous Q24H  . folic acid  1 mg Oral Daily  . gabapentin  300 mg Oral BID  . multivitamin with minerals  1 tablet Oral Daily  . nicotine  21 mg Transdermal Daily  . pantoprazole  40 mg Oral Daily  . tamsulosin  0.4 mg Oral QHS  . thiamine  100 mg Oral Daily  . traZODone  50 mg Oral QHS   Continuous Infusions: . sodium chloride 10 mL/hr at 01/06/15 1349

## 2015-01-08 DIAGNOSIS — I5042 Chronic combined systolic (congestive) and diastolic (congestive) heart failure: Secondary | ICD-10-CM

## 2015-01-08 LAB — URINE CULTURE: Culture: >=100000

## 2015-01-08 MED ORDER — POTASSIUM CHLORIDE ER 10 MEQ PO TBCR: 10.0000 meq | EXTENDED_RELEASE_TABLET | Freq: Every day | ORAL | Status: DC

## 2015-01-08 MED ORDER — CARVEDILOL 3.125 MG PO TABS
3.1250 mg | ORAL_TABLET | Freq: Two times a day (BID) | ORAL | Status: DC
Start: 2015-01-08 — End: 2017-04-16

## 2015-01-08 MED ORDER — FUROSEMIDE 40 MG PO TABS: 20.0000 mg | ORAL_TABLET | Freq: Every day | ORAL | Status: DC

## 2015-01-08 MED ORDER — ACAMPROSATE CALCIUM 333 MG PO TBEC: 666.0000 mg | DELAYED_RELEASE_TABLET | Freq: Three times a day (TID) | ORAL | Status: DC

## 2015-01-08 MED ORDER — ACETAMINOPHEN 325 MG PO TABS: 650.0000 mg | ORAL_TABLET | Freq: Four times a day (QID) | ORAL | Status: DC | PRN

## 2015-01-08 MED ORDER — NITROFURANTOIN MONOHYD MACRO 100 MG PO CAPS: 100.0000 mg | ORAL_CAPSULE | Freq: Two times a day (BID) | ORAL | Status: DC

## 2015-01-08 NOTE — Progress Notes (Signed)
[id number]/PTCKFW Davis,RN,BSN,CCM: patient chose [facility] for [facility] needs :RN,PT,and OT.  [facility] notified.

## 2015-01-08 NOTE — Discharge Summary (Signed)
Physician Discharge Summary  Peter [name] YHC:[id number] DOB: 1951/02/06 DOA: 01/05/2015  PCP: Lorayne Marek, MD  Admit date: 01/05/2015 Discharge date: 01/08/2015  Recommendations for Outpatient Follow-up:  1. Please note we have made the following change to the blood pressure medications: We decreased Lasix from 40 mg to 20 mg a day. We decreased carvedilol from 6.25 mg to 3.125 mg twice daily. Current blood pressure is 134/69. 2. Take Macrobid for 5 days on discharge for treatment of urinary tract infection.  Discharge Diagnoses:  Active Problems:   COPD (chronic obstructive pulmonary disease)   Hypokalemia   UTI (urinary tract infection)   Chronic alcohol abuse   Tobacco abuse   Malnutrition of moderate degree   Hypotension, postural   UTI (lower urinary tract infection)   Acute renal failure   ARF (acute renal failure)    Discharge Condition: stable   Diet recommendation: as tolerated   History of present illness:  64y.o. male with past medical history of alcohol abuse, chronic diastolic and systolic CHF (last 2 D ECHO in 11/2014 with EF of 50% with grade 1 diastolic dysfunction), emphysema, liver cirrhosis, chronic pain, dyslipidemia, hypertension who presented to [facility] ED with persistent weakness and lightheadedness over past 1 week prior to this admission. HHRN was visiting the pt and noted that his BP was low and has referred him to ED for evaluation.   In ED, pt was hypotensive with BP as low as 69/46 which has improved with IV fluids. He was found to have large leukocytes and many bacteria on urinalysis. He was given rocephin in ED. His UDS was normal and alcohol level was WNL.  Hospital Course:    Assessment/Plan:    Principal Problem: Lightheadedness / hypotension - Lightheadedness likely from hypotension.  - Systolic blood pressure is currently in 130s range - We will continue carvedilol and Lasix however at half the dose on discharge. He is on those  medications because of chronic systolic and diastolic congestive heart failure. - Per PT eval - HHP (order placed)   Active Problems: Chronic systolic and diastolic congestive heart failure - Last 2-D echo in May 2016 with ejection fraction of 50% with grade 1 diastolic dysfunction. - CHF compensated. - Patient will continue Lasix and Coreg on discharge however at a lower dose because of risk of hypotension. - We will hold lisinopril because of risk of hypotension.  COPD (chronic obstructive pulmonary disease) - Stable - Not in acute exacerbation  Hypokalemia - Due to lasix. - Being supplemented.   UTI (urinary tract infection) due to gram negative rods  - UA on admission showed large leukocytes and many bacteria - Patient has received Rocephin since the admission. Final results of urine culture are still not reported. Will place patient on Macrobid for 5 days on discharge. If anything is different on urine culture and will inform patient of the change identified except as needed.  Anemia of chronic disease / leukopenia - Due to bone marrow suppression from alcohol abuse - Hemoglobin is 8.8, WBC count 3.8 - Stable   Chronic alcohol abuse - Normal UDS and ethanol level - No reports of withdrawals  Tobacco abuse - Counseled on smoking cessation  Malnutrition of moderate degree - In the context of chronic illness - Tolerates regular diet  ARF (acute renal failure) - Likely secondary to lasix, lisinopril which were both held on admission - We will continue Lasix at a lower dose on discharge. Lisinopril will remain on hold because of the  risk of hypotension while on 3 blood pressure medications. - Creatinine normalized with IV fluids    DVT Prophylaxis  - Lovenox sub Q ordered while pt is in hospital    Code Status: Full.  Family Communication: plan of care discussed with the patient   IV access:  Peripheral IV  Procedures and diagnostic studies:   Dg  Chest Port 1 View 01/11/2015 COPD/chronic bronchitis. No acute superimposed process. Aortic atherosclerosis.   Medical Consultants:  None   Other Consultants:  None   IAnti-Infectives:   Rocephin January 11, 2015 --> 01/08/2015    Signed:  Leisa Lenz, MD  [facility] 01/08/2015, 9:57 AM  Pager #: [phone]  Time spent in minutes: less than 30 minutes    Discharge Exam: Filed Vitals:   01/08/15 0519  BP: 134/69  Pulse: 82  Temp: 98.7 F (37.1 C)  Resp: 18   Filed Vitals:   01/07/15 2032 01/07/15 2048 01/08/15 0519 01/08/15 0904  BP: 135/77  134/69   Pulse: 83  82   Temp: 99.3 F (37.4 C)  98.7 F (37.1 C)   TempSrc: Oral  Oral   Resp: 18  18   Height:      Weight:      SpO2: 99% 100% 100% 97%    General: Pt is alert, follows commands appropriately, not in acute distress Cardiovascular: Regular rate and rhythm, S1/S2 appreciated  Respiratory: Clear to auscultation bilaterally, no wheezing, no crackles, no rhonchi Abdominal: Soft, non tender, non distended, bowel sounds +, no guarding Extremities: no edema, no cyanosis, pulses palpable bilaterally DP and PT Neuro: Grossly nonfocal  Discharge Instructions  Discharge Instructions    Call MD for:  difficulty breathing, headache or visual disturbances    Complete by:  As directed      Call MD for:  persistant nausea and vomiting    Complete by:  As directed      Call MD for:  severe uncontrolled pain    Complete by:  As directed      Diet - low sodium heart healthy    Complete by:  As directed      Discharge instructions    Complete by:  As directed   1. Please note we have made the following change to the blood pressure medications: We decreased Lasix from 40 mg to 20 mg a day. We decreased carvedilol from 6.25 mg to 3.125 mg twice daily. Current blood pressure is 134/69. 2. Take Macrobid for 5 days on discharge for treatment of urinary tract infection.     Increase activity slowly     Complete by:  As directed             Medication List    STOP taking these medications        folic acid 1 MG tablet  Commonly known as:  FOLVITE     lisinopril 5 MG tablet  Commonly known as:  PRINIVIL,ZESTRIL     multivitamin tablet     potassium chloride SA 20 MEQ tablet  Commonly known as:  K-DUR,KLOR-CON     thiamine 100 MG tablet  Commonly known as:  VITAMIN B-1     vitamin B-12 100 MCG tablet  Commonly known as:  CYANOCOBALAMIN      TAKE these medications        acamprosate 333 MG tablet  Commonly known as:  CAMPRAL  Take 2 tablets (666 mg total) by mouth 3 (three) times daily with meals.  acetaminophen 325 MG tablet  Commonly known as:  TYLENOL  Take 2 tablets (650 mg total) by mouth every 6 (six) hours as needed for mild pain (or Fever >/= 101).     albuterol 108 (90 BASE) MCG/ACT inhaler  Commonly known as:  PROVENTIL HFA;VENTOLIN HFA  Inhale 2 puffs into the lungs 4 (four) times daily.     atorvastatin 80 MG tablet  Commonly known as:  LIPITOR  Take 1 tablet (80 mg total) by mouth daily.     brimonidine 0.2 % ophthalmic solution  Commonly known as:  ALPHAGAN  Place 1 drop into both eyes 2 (two) times daily.     budesonide-formoterol 80-4.5 MCG/ACT inhaler  Commonly known as:  SYMBICORT  Inhale 2 puffs into the lungs 2 (two) times daily.     carvedilol 3.125 MG tablet  Commonly known as:  COREG  Take 1 tablet (3.125 mg total) by mouth 2 (two) times daily with a meal.     furosemide 40 MG tablet  Commonly known as:  LASIX  Take 0.5 tablets (20 mg total) by mouth daily.     gabapentin 300 MG capsule  Commonly known as:  NEURONTIN  Take 300 mg by mouth 2 (two) times daily.     ipratropium-albuterol 0.5-2.5 (3) MG/3ML Soln  Commonly known as:  DUONEB  Take 3 mLs by nebulization every 4 (four) hours as needed.     nicotine 21 mg/24hr patch  Commonly known as:  NICODERM CQ  Place 1 patch (21 mg total) onto the skin daily.      nitrofurantoin (macrocrystal-monohydrate) 100 MG capsule  Commonly known as:  MACROBID  Take 1 capsule (100 mg total) by mouth 2 (two) times daily.     pantoprazole 40 MG tablet  Commonly known as:  PROTONIX  Take 1 tablet (40 mg total) by mouth daily.     potassium chloride 10 MEQ tablet  Commonly known as:  K-DUR  Take 1 tablet (10 mEq total) by mouth daily.     tamsulosin 0.4 MG Caps capsule  Commonly known as:  FLOMAX  Take 0.4 mg by mouth at bedtime.     traZODone 50 MG tablet  Commonly known as:  DESYREL  Take 1 tablet (50 mg total) by mouth at bedtime.     vitamin E 200 UNIT capsule  Take 1 capsule (200 Units total) by mouth daily.            Follow-up Information    Follow up with Lorayne Marek, MD. Schedule an appointment as soon as possible for a visit in 1 week.   Specialty:  Internal Medicine   Why:  Follow up appt after recent hospitalization   Contact information:   [place] [id number] [phone]        The results of significant diagnostics from this hospitalization (including imaging, microbiology, ancillary and laboratory) are listed below for reference.    Significant Diagnostic Studies: Dg Chest 2 View  12/29/2014   CLINICAL DATA:  Dizziness. Hypotension for 2 weeks. Cirrhosis. COPD.  EXAM: CHEST  2 VIEW  COMPARISON:  07/17/2014  FINDINGS: Tapering of the peripheral pulmonary vasculature favors emphysema. Atherosclerotic aortic arch with mild tortuosity. Heart size within normal limits.  Old bilateral rib deformities indicating prior fractures. Old left clavicular fracture.  The patient is rotated to the right on today's radiograph, reducing diagnostic sensitivity and specificity. No pleural effusion identified. There is flattening of both hemidiaphragms. Thoracic spondylosis is present.  IMPRESSION: 1. Emphysema. 2. Atherosclerotic and tortuous thoracic aorta. 3. Old healed bilateral rib fractures and old left clavicular  fracture.   Electronically Signed   By: Van Clines M.D.   On: 12/29/2014 19:28   Dg Chest Port 1 View  01/05/2015   CLINICAL DATA:  Weakness and dizziness for 1 week. Emphysema. COPD. Ethanol abuse.  EXAM: PORTABLE CHEST - 1 VIEW  COMPARISON:  12/29/2014  FINDINGS: Remote right rib and left clavicular fractures. Midline trachea. Normal heart size. Atherosclerosis in the transverse aorta. No pleural effusion or pneumothorax. Diffuse peribronchial thickening. No lobar consolidation.  IMPRESSION: COPD/chronic bronchitis.  No acute superimposed process.  Aortic atherosclerosis.   Electronically Signed   By: Abigail Miyamoto M.D.   On: 01/05/2015 17:00    Microbiology: Recent Results (from the past 240 hour(s))  Urine culture     Status: None (Preliminary result)   Collection Time: 01/05/15  6:07 PM  Result Value Ref Range Status   Specimen Description URINE, CLEAN CATCH  Final   Special Requests NONE  Final   Culture   Final    >=100,000 COLONIES/mL GRAM NEGATIVE RODS Performed at [facility]    Report Status PENDING  Incomplete     Labs: Basic Metabolic Panel:  Recent Labs Lab 01/05/15 1707 01/05/15 1716 01/06/15 0514 01/07/15 0517  NA 136  --  141 141  K 3.3*  --  3.3* 3.9  CL 99*  --  110 108  CO2 25  --  23 24  GLUCOSE 72  --  93 93  BUN 21*  --  14 10  CREATININE 1.86*  --  1.03 0.85  CALCIUM 9.3  --  8.9 9.3  MG  --  1.3* 1.3* 1.4*  PHOS  --  4.1 4.2  --    Liver Function Tests:  Recent Labs Lab 01/05/15 1707 01/06/15 0514  AST 16 11*  ALT 8* 6*  ALKPHOS 59 50  BILITOT 0.4 0.3  PROT 8.0 6.5  ALBUMIN 3.9 3.4*   No results for input(s): LIPASE, AMYLASE in the last 168 hours. No results for input(s): AMMONIA in the last 168 hours. CBC:  Recent Labs Lab 01/05/15 1707 01/06/15 0514  WBC 4.2 3.8*  NEUTROABS 1.9  --   HGB 9.7* 8.8*  HCT 30.6* 27.5*  MCV 78.9 78.6  PLT 343 349   Cardiac Enzymes: No results for input(s): CKTOTAL, CKMB,  CKMBINDEX, TROPONINI in the last 168 hours. BNP: BNP (last 3 results) No results for input(s): BNP in the last 8760 hours.  ProBNP (last 3 results)  Recent Labs  05/28/14 0500  PROBNP 6527.0*    CBG:  Recent Labs Lab 01/07/15 0729  GLUCAP 77

## 2015-01-08 NOTE — Discharge Instructions (Signed)
Hypotension  As your heart beats, it forces blood through your arteries. This force is your blood pressure. If your blood pressure is too low for you to go about your normal activities or to support the organs of your body, you have hypotension. Hypotension is also referred to as low blood pressure. When your blood pressure becomes too low, you may not get enough blood to your brain. As a result, you may feel weak, feel lightheaded, or develop a rapid heart rate. In a more severe case, you may faint.  CAUSES  Various conditions can cause hypotension. These include:  · Blood loss.  · Dehydration.  · Heart or endocrine problems.  · Pregnancy.  · Severe infection.  · Not having a well-balanced diet filled with needed nutrients.  · Severe allergic reactions (anaphylaxis).  Some medicines, such as blood pressure medicine or water pills (diuretics), may lower your blood pressure below normal. Sometimes taking too much medicine or taking medicine not as directed can cause hypotension.  TREATMENT   Hospitalization is sometimes required for hypotension if fluid or blood replacement is needed, if time is needed for medicines to wear off, or if further monitoring is needed. Treatment might include changing your diet, changing your medicines (including medicines aimed at raising your blood pressure), and use of support stockings.  HOME CARE INSTRUCTIONS   · Drink enough fluids to keep your urine clear or pale yellow.  · Take your medicines as directed by your health care provider.  · Get up slowly from reclining or sitting positions. This gives your blood pressure a chance to adjust.  · Wear support stockings as directed by your health care provider.  · Maintain a healthy diet by including nutritious food, such as fruits, vegetables, nuts, whole grains, and lean meats.  SEEK MEDICAL CARE IF:  · You have vomiting or diarrhea.  · You have a fever for more than 2-3 days.  · You feel more thirsty than usual.  · You feel weak and  tired.  SEEK IMMEDIATE MEDICAL CARE IF:   · You have chest pain or a fast or irregular heartbeat.  · You have a loss of feeling in some part of your body, or you lose movement in your arms or legs.  · You have trouble speaking.  · You become sweaty or feel lightheaded.  · You faint.  MAKE SURE YOU:   · Understand these instructions.  · Will watch your condition.  · Will get help right away if you are not doing well or get worse.  Document Released: 06/23/2005 Document Revised: 04/13/2013 Document Reviewed: 12/24/2012  ExitCare® Patient Information ©2015 [employer]. This information is not intended to replace advice given to you by your health care provider. Make sure you discuss any questions you have with your health care provider.

## 2015-01-09 DIAGNOSIS — I951 Orthostatic hypotension: Secondary | ICD-10-CM | POA: Diagnosis not present

## 2015-01-09 DIAGNOSIS — F10229 Alcohol dependence with intoxication, unspecified: Secondary | ICD-10-CM | POA: Diagnosis not present

## 2015-01-09 DIAGNOSIS — I426 Alcoholic cardiomyopathy: Secondary | ICD-10-CM | POA: Diagnosis not present

## 2015-01-09 DIAGNOSIS — Z8673 Personal history of transient ischemic attack (TIA), and cerebral infarction without residual deficits: Secondary | ICD-10-CM | POA: Diagnosis not present

## 2015-01-09 DIAGNOSIS — R32 Unspecified urinary incontinence: Secondary | ICD-10-CM | POA: Diagnosis not present

## 2015-01-09 DIAGNOSIS — D649 Anemia, unspecified: Secondary | ICD-10-CM | POA: Diagnosis not present

## 2015-01-09 DIAGNOSIS — H409 Unspecified glaucoma: Secondary | ICD-10-CM | POA: Diagnosis not present

## 2015-01-09 DIAGNOSIS — F419 Anxiety disorder, unspecified: Secondary | ICD-10-CM | POA: Diagnosis not present

## 2015-01-09 DIAGNOSIS — S72111D Displaced fracture of greater trochanter of right femur, subsequent encounter for closed fracture with routine healing: Secondary | ICD-10-CM | POA: Diagnosis not present

## 2015-01-09 DIAGNOSIS — J449 Chronic obstructive pulmonary disease, unspecified: Secondary | ICD-10-CM | POA: Diagnosis not present

## 2015-01-09 DIAGNOSIS — I10 Essential (primary) hypertension: Secondary | ICD-10-CM | POA: Diagnosis not present

## 2015-01-09 DIAGNOSIS — Z9181 History of falling: Secondary | ICD-10-CM | POA: Diagnosis not present

## 2015-01-09 DIAGNOSIS — M6281 Muscle weakness (generalized): Secondary | ICD-10-CM | POA: Diagnosis not present

## 2015-01-09 DIAGNOSIS — F339 Major depressive disorder, recurrent, unspecified: Secondary | ICD-10-CM | POA: Diagnosis not present

## 2015-01-09 DIAGNOSIS — K7030 Alcoholic cirrhosis of liver without ascites: Secondary | ICD-10-CM | POA: Diagnosis not present

## 2015-01-09 LAB — HEMOGLOBIN A1C
Hgb A1c MFr Bld: 5.2 % (ref 4.8–5.6)
MEAN PLASMA GLUCOSE: 103 mg/dL

## 2015-01-10 DIAGNOSIS — I10 Essential (primary) hypertension: Secondary | ICD-10-CM | POA: Diagnosis not present

## 2015-01-10 DIAGNOSIS — M6281 Muscle weakness (generalized): Secondary | ICD-10-CM | POA: Diagnosis not present

## 2015-01-10 DIAGNOSIS — F419 Anxiety disorder, unspecified: Secondary | ICD-10-CM | POA: Diagnosis not present

## 2015-01-10 DIAGNOSIS — S72111D Displaced fracture of greater trochanter of right femur, subsequent encounter for closed fracture with routine healing: Secondary | ICD-10-CM | POA: Diagnosis not present

## 2015-01-10 DIAGNOSIS — H409 Unspecified glaucoma: Secondary | ICD-10-CM | POA: Diagnosis not present

## 2015-01-10 DIAGNOSIS — I426 Alcoholic cardiomyopathy: Secondary | ICD-10-CM | POA: Diagnosis not present

## 2015-01-10 DIAGNOSIS — R32 Unspecified urinary incontinence: Secondary | ICD-10-CM | POA: Diagnosis not present

## 2015-01-10 DIAGNOSIS — I951 Orthostatic hypotension: Secondary | ICD-10-CM | POA: Diagnosis not present

## 2015-01-10 DIAGNOSIS — D649 Anemia, unspecified: Secondary | ICD-10-CM | POA: Diagnosis not present

## 2015-01-10 DIAGNOSIS — K7030 Alcoholic cirrhosis of liver without ascites: Secondary | ICD-10-CM | POA: Diagnosis not present

## 2015-01-10 DIAGNOSIS — F339 Major depressive disorder, recurrent, unspecified: Secondary | ICD-10-CM | POA: Diagnosis not present

## 2015-01-10 DIAGNOSIS — F10229 Alcohol dependence with intoxication, unspecified: Secondary | ICD-10-CM | POA: Diagnosis not present

## 2015-01-10 DIAGNOSIS — Z9181 History of falling: Secondary | ICD-10-CM | POA: Diagnosis not present

## 2015-01-10 DIAGNOSIS — J449 Chronic obstructive pulmonary disease, unspecified: Secondary | ICD-10-CM | POA: Diagnosis not present

## 2015-01-10 DIAGNOSIS — Z8673 Personal history of transient ischemic attack (TIA), and cerebral infarction without residual deficits: Secondary | ICD-10-CM | POA: Diagnosis not present

## 2015-01-11 ENCOUNTER — Ambulatory Visit: Payer: Medicare Other | Admitting: Internal Medicine

## 2015-01-11 ENCOUNTER — Encounter: Payer: Self-pay | Admitting: Internal Medicine

## 2015-01-11 VITALS — BP 105/65 | HR 98 | Temp 98.1°F | Resp 18 | Ht 67.0 in | Wt 147.2 lb

## 2015-01-11 DIAGNOSIS — R32 Unspecified urinary incontinence: Secondary | ICD-10-CM | POA: Diagnosis not present

## 2015-01-11 DIAGNOSIS — F419 Anxiety disorder, unspecified: Secondary | ICD-10-CM | POA: Diagnosis not present

## 2015-01-11 DIAGNOSIS — Z9181 History of falling: Secondary | ICD-10-CM | POA: Diagnosis not present

## 2015-01-11 DIAGNOSIS — F339 Major depressive disorder, recurrent, unspecified: Secondary | ICD-10-CM | POA: Diagnosis not present

## 2015-01-11 DIAGNOSIS — I426 Alcoholic cardiomyopathy: Secondary | ICD-10-CM | POA: Diagnosis not present

## 2015-01-11 DIAGNOSIS — S72111D Displaced fracture of greater trochanter of right femur, subsequent encounter for closed fracture with routine healing: Secondary | ICD-10-CM | POA: Diagnosis not present

## 2015-01-11 DIAGNOSIS — H409 Unspecified glaucoma: Secondary | ICD-10-CM | POA: Diagnosis not present

## 2015-01-11 DIAGNOSIS — I10 Essential (primary) hypertension: Secondary | ICD-10-CM | POA: Diagnosis not present

## 2015-01-11 DIAGNOSIS — F10229 Alcohol dependence with intoxication, unspecified: Secondary | ICD-10-CM | POA: Diagnosis not present

## 2015-01-11 DIAGNOSIS — M6281 Muscle weakness (generalized): Secondary | ICD-10-CM | POA: Diagnosis not present

## 2015-01-11 DIAGNOSIS — J449 Chronic obstructive pulmonary disease, unspecified: Secondary | ICD-10-CM | POA: Diagnosis not present

## 2015-01-11 DIAGNOSIS — Z8673 Personal history of transient ischemic attack (TIA), and cerebral infarction without residual deficits: Secondary | ICD-10-CM | POA: Diagnosis not present

## 2015-01-11 DIAGNOSIS — K7030 Alcoholic cirrhosis of liver without ascites: Secondary | ICD-10-CM | POA: Diagnosis not present

## 2015-01-11 DIAGNOSIS — I951 Orthostatic hypotension: Secondary | ICD-10-CM | POA: Diagnosis not present

## 2015-01-11 DIAGNOSIS — D649 Anemia, unspecified: Secondary | ICD-10-CM | POA: Diagnosis not present

## 2015-01-11 MED ORDER — TRAZODONE HCL 50 MG PO TABS: 50.0000 mg | ORAL_TABLET | Freq: Every day | ORAL | Status: DC

## 2015-01-11 MED ORDER — NITROFURANTOIN MONOHYD MACRO 100 MG PO CAPS: 100.0000 mg | ORAL_CAPSULE | Freq: Two times a day (BID) | ORAL | Status: DC

## 2015-01-11 MED ORDER — TAMSULOSIN HCL 0.4 MG PO CAPS: 0.4000 mg | ORAL_CAPSULE | Freq: Every day | ORAL | Status: DC

## 2015-01-11 MED ORDER — ATORVASTATIN CALCIUM 80 MG PO TABS: 80.0000 mg | ORAL_TABLET | Freq: Every day | ORAL | Status: DC

## 2015-01-11 NOTE — Progress Notes (Unsigned)
Patient here for hospital follow up for low blood pressure and to get authorization for medications to be filled.  Patient states he feels "alright," and denies pain, shortness of breath, weakness, fatigue, and dizziness at this time.  BP 105/65, HR 98.

## 2015-01-11 NOTE — Progress Notes (Unsigned)
Called [facility] on behalf of the patient  Patient just needed refills sent to the pharmacy Prescriptions e scribed to pharmacy on file

## 2015-01-12 DIAGNOSIS — S72111D Displaced fracture of greater trochanter of right femur, subsequent encounter for closed fracture with routine healing: Secondary | ICD-10-CM | POA: Diagnosis not present

## 2015-01-12 DIAGNOSIS — Z9181 History of falling: Secondary | ICD-10-CM | POA: Diagnosis not present

## 2015-01-12 DIAGNOSIS — I10 Essential (primary) hypertension: Secondary | ICD-10-CM | POA: Diagnosis not present

## 2015-01-12 DIAGNOSIS — M6281 Muscle weakness (generalized): Secondary | ICD-10-CM | POA: Diagnosis not present

## 2015-01-12 DIAGNOSIS — F419 Anxiety disorder, unspecified: Secondary | ICD-10-CM | POA: Diagnosis not present

## 2015-01-12 DIAGNOSIS — Z8673 Personal history of transient ischemic attack (TIA), and cerebral infarction without residual deficits: Secondary | ICD-10-CM | POA: Diagnosis not present

## 2015-01-12 DIAGNOSIS — K7030 Alcoholic cirrhosis of liver without ascites: Secondary | ICD-10-CM | POA: Diagnosis not present

## 2015-01-12 DIAGNOSIS — I426 Alcoholic cardiomyopathy: Secondary | ICD-10-CM | POA: Diagnosis not present

## 2015-01-12 DIAGNOSIS — H409 Unspecified glaucoma: Secondary | ICD-10-CM | POA: Diagnosis not present

## 2015-01-12 DIAGNOSIS — R32 Unspecified urinary incontinence: Secondary | ICD-10-CM | POA: Diagnosis not present

## 2015-01-12 DIAGNOSIS — J449 Chronic obstructive pulmonary disease, unspecified: Secondary | ICD-10-CM | POA: Diagnosis not present

## 2015-01-12 DIAGNOSIS — F10229 Alcohol dependence with intoxication, unspecified: Secondary | ICD-10-CM | POA: Diagnosis not present

## 2015-01-12 DIAGNOSIS — I951 Orthostatic hypotension: Secondary | ICD-10-CM | POA: Diagnosis not present

## 2015-01-12 DIAGNOSIS — F339 Major depressive disorder, recurrent, unspecified: Secondary | ICD-10-CM | POA: Diagnosis not present

## 2015-01-12 DIAGNOSIS — D649 Anemia, unspecified: Secondary | ICD-10-CM | POA: Diagnosis not present

## 2015-01-15 ENCOUNTER — Telehealth: Payer: Self-pay | Admitting: Internal Medicine

## 2015-01-15 ENCOUNTER — Telehealth: Payer: Self-pay

## 2015-01-15 NOTE — Telephone Encounter (Signed)
Monique nurse from [facility] care called requesting approval for 2 week extension for physical therapy,nurse needs verbal to continue. Please f/u

## 2015-01-15 NOTE — Telephone Encounter (Signed)
Spoke with Monique physical therapist Requesting a verbal order to continue PT for an  Additional two weeks as per the plan of care Verbal order given

## 2015-01-16 DIAGNOSIS — I426 Alcoholic cardiomyopathy: Secondary | ICD-10-CM | POA: Diagnosis not present

## 2015-01-16 DIAGNOSIS — H409 Unspecified glaucoma: Secondary | ICD-10-CM | POA: Diagnosis not present

## 2015-01-16 DIAGNOSIS — D649 Anemia, unspecified: Secondary | ICD-10-CM | POA: Diagnosis not present

## 2015-01-16 DIAGNOSIS — K7030 Alcoholic cirrhosis of liver without ascites: Secondary | ICD-10-CM | POA: Diagnosis not present

## 2015-01-16 DIAGNOSIS — I951 Orthostatic hypotension: Secondary | ICD-10-CM | POA: Diagnosis not present

## 2015-01-16 DIAGNOSIS — F10229 Alcohol dependence with intoxication, unspecified: Secondary | ICD-10-CM | POA: Diagnosis not present

## 2015-01-16 DIAGNOSIS — Z8673 Personal history of transient ischemic attack (TIA), and cerebral infarction without residual deficits: Secondary | ICD-10-CM | POA: Diagnosis not present

## 2015-01-16 DIAGNOSIS — M6281 Muscle weakness (generalized): Secondary | ICD-10-CM | POA: Diagnosis not present

## 2015-01-16 DIAGNOSIS — I10 Essential (primary) hypertension: Secondary | ICD-10-CM | POA: Diagnosis not present

## 2015-01-16 DIAGNOSIS — R32 Unspecified urinary incontinence: Secondary | ICD-10-CM | POA: Diagnosis not present

## 2015-01-16 DIAGNOSIS — F419 Anxiety disorder, unspecified: Secondary | ICD-10-CM | POA: Diagnosis not present

## 2015-01-16 DIAGNOSIS — Z9181 History of falling: Secondary | ICD-10-CM | POA: Diagnosis not present

## 2015-01-16 DIAGNOSIS — F339 Major depressive disorder, recurrent, unspecified: Secondary | ICD-10-CM | POA: Diagnosis not present

## 2015-01-16 DIAGNOSIS — S72111D Displaced fracture of greater trochanter of right femur, subsequent encounter for closed fracture with routine healing: Secondary | ICD-10-CM | POA: Diagnosis not present

## 2015-01-16 DIAGNOSIS — J449 Chronic obstructive pulmonary disease, unspecified: Secondary | ICD-10-CM | POA: Diagnosis not present

## 2015-01-17 DIAGNOSIS — M6281 Muscle weakness (generalized): Secondary | ICD-10-CM | POA: Diagnosis not present

## 2015-01-17 DIAGNOSIS — K7030 Alcoholic cirrhosis of liver without ascites: Secondary | ICD-10-CM | POA: Diagnosis not present

## 2015-01-17 DIAGNOSIS — S72111D Displaced fracture of greater trochanter of right femur, subsequent encounter for closed fracture with routine healing: Secondary | ICD-10-CM | POA: Diagnosis not present

## 2015-01-17 DIAGNOSIS — I10 Essential (primary) hypertension: Secondary | ICD-10-CM | POA: Diagnosis not present

## 2015-01-17 DIAGNOSIS — F419 Anxiety disorder, unspecified: Secondary | ICD-10-CM | POA: Diagnosis not present

## 2015-01-17 DIAGNOSIS — I426 Alcoholic cardiomyopathy: Secondary | ICD-10-CM | POA: Diagnosis not present

## 2015-01-17 DIAGNOSIS — D649 Anemia, unspecified: Secondary | ICD-10-CM | POA: Diagnosis not present

## 2015-01-17 DIAGNOSIS — F10229 Alcohol dependence with intoxication, unspecified: Secondary | ICD-10-CM | POA: Diagnosis not present

## 2015-01-17 DIAGNOSIS — Z9181 History of falling: Secondary | ICD-10-CM | POA: Diagnosis not present

## 2015-01-17 DIAGNOSIS — I951 Orthostatic hypotension: Secondary | ICD-10-CM | POA: Diagnosis not present

## 2015-01-17 DIAGNOSIS — R32 Unspecified urinary incontinence: Secondary | ICD-10-CM | POA: Diagnosis not present

## 2015-01-17 DIAGNOSIS — H409 Unspecified glaucoma: Secondary | ICD-10-CM | POA: Diagnosis not present

## 2015-01-17 DIAGNOSIS — F339 Major depressive disorder, recurrent, unspecified: Secondary | ICD-10-CM | POA: Diagnosis not present

## 2015-01-17 DIAGNOSIS — Z8673 Personal history of transient ischemic attack (TIA), and cerebral infarction without residual deficits: Secondary | ICD-10-CM | POA: Diagnosis not present

## 2015-01-17 DIAGNOSIS — J449 Chronic obstructive pulmonary disease, unspecified: Secondary | ICD-10-CM | POA: Diagnosis not present

## 2015-01-18 DIAGNOSIS — M7541 Impingement syndrome of right shoulder: Secondary | ICD-10-CM | POA: Diagnosis not present

## 2015-01-18 DIAGNOSIS — S72114D Nondisplaced fracture of greater trochanter of right femur, subsequent encounter for closed fracture with routine healing: Secondary | ICD-10-CM | POA: Diagnosis not present

## 2015-01-19 DIAGNOSIS — J449 Chronic obstructive pulmonary disease, unspecified: Secondary | ICD-10-CM | POA: Diagnosis not present

## 2015-01-19 DIAGNOSIS — M6281 Muscle weakness (generalized): Secondary | ICD-10-CM | POA: Diagnosis not present

## 2015-01-19 DIAGNOSIS — I10 Essential (primary) hypertension: Secondary | ICD-10-CM | POA: Diagnosis not present

## 2015-01-19 DIAGNOSIS — D649 Anemia, unspecified: Secondary | ICD-10-CM | POA: Diagnosis not present

## 2015-01-19 DIAGNOSIS — I951 Orthostatic hypotension: Secondary | ICD-10-CM | POA: Diagnosis not present

## 2015-01-19 DIAGNOSIS — Z9181 History of falling: Secondary | ICD-10-CM | POA: Diagnosis not present

## 2015-01-19 DIAGNOSIS — Z8673 Personal history of transient ischemic attack (TIA), and cerebral infarction without residual deficits: Secondary | ICD-10-CM | POA: Diagnosis not present

## 2015-01-19 DIAGNOSIS — F339 Major depressive disorder, recurrent, unspecified: Secondary | ICD-10-CM | POA: Diagnosis not present

## 2015-01-19 DIAGNOSIS — I426 Alcoholic cardiomyopathy: Secondary | ICD-10-CM | POA: Diagnosis not present

## 2015-01-19 DIAGNOSIS — R32 Unspecified urinary incontinence: Secondary | ICD-10-CM | POA: Diagnosis not present

## 2015-01-19 DIAGNOSIS — S72111D Displaced fracture of greater trochanter of right femur, subsequent encounter for closed fracture with routine healing: Secondary | ICD-10-CM | POA: Diagnosis not present

## 2015-01-19 DIAGNOSIS — F10229 Alcohol dependence with intoxication, unspecified: Secondary | ICD-10-CM | POA: Diagnosis not present

## 2015-01-19 DIAGNOSIS — K7030 Alcoholic cirrhosis of liver without ascites: Secondary | ICD-10-CM | POA: Diagnosis not present

## 2015-01-19 DIAGNOSIS — H409 Unspecified glaucoma: Secondary | ICD-10-CM | POA: Diagnosis not present

## 2015-01-19 DIAGNOSIS — F419 Anxiety disorder, unspecified: Secondary | ICD-10-CM | POA: Diagnosis not present

## 2015-01-23 DIAGNOSIS — S72111D Displaced fracture of greater trochanter of right femur, subsequent encounter for closed fracture with routine healing: Secondary | ICD-10-CM | POA: Diagnosis not present

## 2015-01-23 DIAGNOSIS — F339 Major depressive disorder, recurrent, unspecified: Secondary | ICD-10-CM | POA: Diagnosis not present

## 2015-01-23 DIAGNOSIS — D649 Anemia, unspecified: Secondary | ICD-10-CM | POA: Diagnosis not present

## 2015-01-23 DIAGNOSIS — K7030 Alcoholic cirrhosis of liver without ascites: Secondary | ICD-10-CM | POA: Diagnosis not present

## 2015-01-23 DIAGNOSIS — F10229 Alcohol dependence with intoxication, unspecified: Secondary | ICD-10-CM | POA: Diagnosis not present

## 2015-01-23 DIAGNOSIS — R32 Unspecified urinary incontinence: Secondary | ICD-10-CM | POA: Diagnosis not present

## 2015-01-23 DIAGNOSIS — I10 Essential (primary) hypertension: Secondary | ICD-10-CM | POA: Diagnosis not present

## 2015-01-23 DIAGNOSIS — I426 Alcoholic cardiomyopathy: Secondary | ICD-10-CM | POA: Diagnosis not present

## 2015-01-23 DIAGNOSIS — Z9181 History of falling: Secondary | ICD-10-CM | POA: Diagnosis not present

## 2015-01-23 DIAGNOSIS — H409 Unspecified glaucoma: Secondary | ICD-10-CM | POA: Diagnosis not present

## 2015-01-23 DIAGNOSIS — I951 Orthostatic hypotension: Secondary | ICD-10-CM | POA: Diagnosis not present

## 2015-01-23 DIAGNOSIS — J449 Chronic obstructive pulmonary disease, unspecified: Secondary | ICD-10-CM | POA: Diagnosis not present

## 2015-01-23 DIAGNOSIS — Z8673 Personal history of transient ischemic attack (TIA), and cerebral infarction without residual deficits: Secondary | ICD-10-CM | POA: Diagnosis not present

## 2015-01-23 DIAGNOSIS — F419 Anxiety disorder, unspecified: Secondary | ICD-10-CM | POA: Diagnosis not present

## 2015-01-23 DIAGNOSIS — M6281 Muscle weakness (generalized): Secondary | ICD-10-CM | POA: Diagnosis not present

## 2015-01-25 DIAGNOSIS — Z9181 History of falling: Secondary | ICD-10-CM | POA: Diagnosis not present

## 2015-01-25 DIAGNOSIS — I426 Alcoholic cardiomyopathy: Secondary | ICD-10-CM | POA: Diagnosis not present

## 2015-01-25 DIAGNOSIS — S72111D Displaced fracture of greater trochanter of right femur, subsequent encounter for closed fracture with routine healing: Secondary | ICD-10-CM | POA: Diagnosis not present

## 2015-01-25 DIAGNOSIS — J449 Chronic obstructive pulmonary disease, unspecified: Secondary | ICD-10-CM | POA: Diagnosis not present

## 2015-01-25 DIAGNOSIS — H409 Unspecified glaucoma: Secondary | ICD-10-CM | POA: Diagnosis not present

## 2015-01-25 DIAGNOSIS — I10 Essential (primary) hypertension: Secondary | ICD-10-CM | POA: Diagnosis not present

## 2015-01-25 DIAGNOSIS — F419 Anxiety disorder, unspecified: Secondary | ICD-10-CM | POA: Diagnosis not present

## 2015-01-25 DIAGNOSIS — R32 Unspecified urinary incontinence: Secondary | ICD-10-CM | POA: Diagnosis not present

## 2015-01-25 DIAGNOSIS — Z8673 Personal history of transient ischemic attack (TIA), and cerebral infarction without residual deficits: Secondary | ICD-10-CM | POA: Diagnosis not present

## 2015-01-25 DIAGNOSIS — M6281 Muscle weakness (generalized): Secondary | ICD-10-CM | POA: Diagnosis not present

## 2015-01-25 DIAGNOSIS — K7030 Alcoholic cirrhosis of liver without ascites: Secondary | ICD-10-CM | POA: Diagnosis not present

## 2015-01-25 DIAGNOSIS — D649 Anemia, unspecified: Secondary | ICD-10-CM | POA: Diagnosis not present

## 2015-01-25 DIAGNOSIS — F339 Major depressive disorder, recurrent, unspecified: Secondary | ICD-10-CM | POA: Diagnosis not present

## 2015-01-25 DIAGNOSIS — F10229 Alcohol dependence with intoxication, unspecified: Secondary | ICD-10-CM | POA: Diagnosis not present

## 2015-01-25 DIAGNOSIS — I951 Orthostatic hypotension: Secondary | ICD-10-CM | POA: Diagnosis not present

## 2015-01-31 DIAGNOSIS — R32 Unspecified urinary incontinence: Secondary | ICD-10-CM | POA: Diagnosis not present

## 2015-01-31 DIAGNOSIS — Z9181 History of falling: Secondary | ICD-10-CM | POA: Diagnosis not present

## 2015-01-31 DIAGNOSIS — M6281 Muscle weakness (generalized): Secondary | ICD-10-CM | POA: Diagnosis not present

## 2015-01-31 DIAGNOSIS — I10 Essential (primary) hypertension: Secondary | ICD-10-CM | POA: Diagnosis not present

## 2015-01-31 DIAGNOSIS — D649 Anemia, unspecified: Secondary | ICD-10-CM | POA: Diagnosis not present

## 2015-01-31 DIAGNOSIS — J449 Chronic obstructive pulmonary disease, unspecified: Secondary | ICD-10-CM | POA: Diagnosis not present

## 2015-01-31 DIAGNOSIS — I426 Alcoholic cardiomyopathy: Secondary | ICD-10-CM | POA: Diagnosis not present

## 2015-01-31 DIAGNOSIS — K7030 Alcoholic cirrhosis of liver without ascites: Secondary | ICD-10-CM | POA: Diagnosis not present

## 2015-01-31 DIAGNOSIS — F419 Anxiety disorder, unspecified: Secondary | ICD-10-CM | POA: Diagnosis not present

## 2015-01-31 DIAGNOSIS — F10229 Alcohol dependence with intoxication, unspecified: Secondary | ICD-10-CM | POA: Diagnosis not present

## 2015-01-31 DIAGNOSIS — Z8673 Personal history of transient ischemic attack (TIA), and cerebral infarction without residual deficits: Secondary | ICD-10-CM | POA: Diagnosis not present

## 2015-01-31 DIAGNOSIS — I951 Orthostatic hypotension: Secondary | ICD-10-CM | POA: Diagnosis not present

## 2015-01-31 DIAGNOSIS — F339 Major depressive disorder, recurrent, unspecified: Secondary | ICD-10-CM | POA: Diagnosis not present

## 2015-01-31 DIAGNOSIS — S72111D Displaced fracture of greater trochanter of right femur, subsequent encounter for closed fracture with routine healing: Secondary | ICD-10-CM | POA: Diagnosis not present

## 2015-01-31 DIAGNOSIS — H409 Unspecified glaucoma: Secondary | ICD-10-CM | POA: Diagnosis not present

## 2015-02-12 NOTE — Telephone Encounter (Signed)
Message received from [place], physical therapist at [facility], that  patient has been discharged from PT as of 01/25/15

## 2015-02-22 ENCOUNTER — Emergency Department (HOSPITAL_COMMUNITY)
Admission: EM | Admit: 2015-02-22 | Discharge: 2015-02-22 | Disposition: A | Discharge status: Home / Self Care | Payer: Medicare Other | Attending: Emergency Medicine | Admitting: Emergency Medicine

## 2015-02-22 ENCOUNTER — Emergency Department (HOSPITAL_COMMUNITY): Payer: Medicare Other

## 2015-02-22 ENCOUNTER — Encounter (HOSPITAL_COMMUNITY): Payer: Self-pay | Admitting: *Deleted

## 2015-02-22 DIAGNOSIS — Z9849 Cataract extraction status, unspecified eye: Secondary | ICD-10-CM | POA: Insufficient documentation

## 2015-02-22 DIAGNOSIS — F329 Major depressive disorder, single episode, unspecified: Secondary | ICD-10-CM | POA: Diagnosis not present

## 2015-02-22 DIAGNOSIS — Z8673 Personal history of transient ischemic attack (TIA), and cerebral infarction without residual deficits: Secondary | ICD-10-CM | POA: Insufficient documentation

## 2015-02-22 DIAGNOSIS — Z8701 Personal history of pneumonia (recurrent): Secondary | ICD-10-CM | POA: Insufficient documentation

## 2015-02-22 DIAGNOSIS — Z7951 Long term (current) use of inhaled steroids: Secondary | ICD-10-CM | POA: Diagnosis not present

## 2015-02-22 DIAGNOSIS — F419 Anxiety disorder, unspecified: Secondary | ICD-10-CM | POA: Insufficient documentation

## 2015-02-22 DIAGNOSIS — E785 Hyperlipidemia, unspecified: Secondary | ICD-10-CM | POA: Diagnosis not present

## 2015-02-22 DIAGNOSIS — Z7409 Other reduced mobility: Secondary | ICD-10-CM | POA: Diagnosis not present

## 2015-02-22 DIAGNOSIS — H409 Unspecified glaucoma: Secondary | ICD-10-CM | POA: Diagnosis not present

## 2015-02-22 DIAGNOSIS — Z79899 Other long term (current) drug therapy: Secondary | ICD-10-CM | POA: Insufficient documentation

## 2015-02-22 DIAGNOSIS — S42292A Other displaced fracture of upper end of left humerus, initial encounter for closed fracture: Secondary | ICD-10-CM | POA: Insufficient documentation

## 2015-02-22 DIAGNOSIS — Y9301 Activity, walking, marching and hiking: Secondary | ICD-10-CM | POA: Diagnosis not present

## 2015-02-22 DIAGNOSIS — J449 Chronic obstructive pulmonary disease, unspecified: Secondary | ICD-10-CM | POA: Diagnosis not present

## 2015-02-22 DIAGNOSIS — M1990 Unspecified osteoarthritis, unspecified site: Secondary | ICD-10-CM | POA: Diagnosis not present

## 2015-02-22 DIAGNOSIS — G8929 Other chronic pain: Secondary | ICD-10-CM | POA: Insufficient documentation

## 2015-02-22 DIAGNOSIS — Y998 Other external cause status: Secondary | ICD-10-CM | POA: Diagnosis not present

## 2015-02-22 DIAGNOSIS — Z720 Tobacco use: Secondary | ICD-10-CM | POA: Insufficient documentation

## 2015-02-22 DIAGNOSIS — Y9289 Other specified places as the place of occurrence of the external cause: Secondary | ICD-10-CM | POA: Diagnosis not present

## 2015-02-22 DIAGNOSIS — S4992XA Unspecified injury of left shoulder and upper arm, initial encounter: Secondary | ICD-10-CM | POA: Diagnosis present

## 2015-02-22 DIAGNOSIS — Z862 Personal history of diseases of the blood and blood-forming organs and certain disorders involving the immune mechanism: Secondary | ICD-10-CM | POA: Diagnosis not present

## 2015-02-22 DIAGNOSIS — E119 Type 2 diabetes mellitus without complications: Secondary | ICD-10-CM | POA: Diagnosis not present

## 2015-02-22 DIAGNOSIS — I10 Essential (primary) hypertension: Secondary | ICD-10-CM | POA: Insufficient documentation

## 2015-02-22 DIAGNOSIS — W010XXA Fall on same level from slipping, tripping and stumbling without subsequent striking against object, initial encounter: Secondary | ICD-10-CM | POA: Diagnosis not present

## 2015-02-22 DIAGNOSIS — H9190 Unspecified hearing loss, unspecified ear: Secondary | ICD-10-CM | POA: Diagnosis not present

## 2015-02-22 MED ORDER — HYDROCODONE-ACETAMINOPHEN 5-325 MG PO TABS: 1.0000 | ORAL_TABLET | ORAL | Status: DC | PRN

## 2015-02-22 MED ORDER — MORPHINE SULFATE (PF) 4 MG/ML IV SOLN
4.0000 mg | INTRAVENOUS | Status: DC | PRN
  Administered 2015-02-22: 4 mg via INTRAVENOUS
  Filled 2015-02-22: qty 1

## 2015-02-22 NOTE — Discharge Instructions (Signed)
Humerus Fracture, Treated with Immobilization  The humerus is the large bone in your upper arm. You have a broken (fractured) humerus. These fractures are easily diagnosed with X-rays.  TREATMENT   Simple fractures which will heal without disability are treated with simple immobilization. Immobilization means you will wear a cast, splint, or sling. You have a fracture which will do well with immobilization. The fracture will heal well simply by being held in a good position until it is stable enough to begin range of motion exercises. Do not take part in activities which would further injure your arm.   HOME CARE INSTRUCTIONS    Put ice on the injured area.   Put ice in a plastic bag.   Place a towel between your skin and the bag.   Leave the ice on for 15-20 minutes, 03-04 times a day.   If you have a cast:   Do not scratch the skin under the cast using sharp or pointed objects.   Check the skin around the cast every day. You may put lotion on any red or sore areas.   Keep your cast dry and clean.   If you have a splint:   Wear the splint as directed.   Keep your splint dry and clean.   You may loosen the elastic around the splint if your fingers become numb, tingle, or turn cold or blue.   If you have a sling:   Wear the sling as directed.   Do not put pressure on any part of your cast or splint until it is fully hardened.   Your cast or splint can be protected during bathing with a plastic bag. Do not lower the cast or splint into water.   Only take over-the-counter or prescription medicines for pain, discomfort, or fever as directed by your caregiver.   Do range of motion exercises as instructed by your caregiver.   Follow up as directed by your caregiver. This is very important in order to avoid permanent injury or disability and chronic pain.  SEEK IMMEDIATE MEDICAL CARE IF:    Your skin or nails in the injured arm turn blue or gray.   Your arm feels cold or numb.   You develop severe  pain in the injured arm.   You are having problems with the medicines you were given.  MAKE SURE YOU:    Understand these instructions.   Will watch your condition.   Will get help right away if you are not doing well or get worse.  Document Released: 09/29/2000 Document Revised: 09/15/2011 Document Reviewed: 08/07/2010  ExitCare Patient Information 2015 [employer]. This information is not intended to replace advice given to you by your health care provider. Make sure you discuss any questions you have with your health care provider.

## 2015-02-22 NOTE — ED Notes (Signed)
Patient was ambulating today in a hurry, tripped and fell onto his left shoulder. Patient called EMS. Patient had spinal clearance, obvious deformity noted to left upper extremity. Patient has had 2-3 40 ounces of beer and he smoked some crack today. Patient was given 269mcg of fentanyl by EMS. Patient endorsed relief.

## 2015-02-22 NOTE — Progress Notes (Signed)
CSW met with patient at bedside. There was no family present. Patient confirms that he presents to [facility] due to falling today. Patient informed CSW that he stumbled. Patient stated " I got a bad R leg. 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   Patient confirms that he comes from home. He states that he lives in [place], and a friend is currently living with him. Patient states that prior to incident he has been able to complete his ADL's independently.  Also, he states that he does not fall often. Patient says that he has fallen x1 or x2 in the past 6 months.  Patient informed CSW that he has a good support system that consist of family. He states his mother is his primary support.  Patient informed CSW that he does not have any questions at this time.  Willette Brace [phone] ED CSW 02/22/2015 9:01 PM

## 2015-02-22 NOTE — ED Notes (Signed)
Bed: QU04 Expected date:  Expected time:  Means of arrival:  Comments: EMS fall, obvious shoulder

## 2015-02-22 NOTE — ED Notes (Signed)
Patient transported to X-ray 

## 2015-02-22 NOTE — ED Provider Notes (Signed)
CSN: [id number]     Arrival date & time 02/22/15  1750 History   First MD Initiated Contact with Patient 02/22/15 1802     Chief Complaint  Patient presents with  . Fall  . Shoulder Injury   Patient is a 64 y.o. male presenting with fall and shoulder injury. The history is provided by the patient.  Fall This is a new problem. Episode onset: today. The problem occurs constantly. The problem has not changed since onset.Pertinent negatives include no chest pain, no abdominal pain, no headaches and no shortness of breath. Associated symptoms comments: Left shoulder pain. Exacerbated by: movement of the right shoulder. Nothing relieves the symptoms. He has tried nothing for the symptoms.  Shoulder Injury Pertinent negatives include no chest pain, no abdominal pain, no headaches and no shortness of breath.   patient states he was walking and tripped over a trashcan. He landed directly on his left shoulder. He denies any head injury or loss of consciousness. No other pain elsewhere  Past Medical History  Diagnosis Date  . Hypertension   . Hyperlipemia   . Emphysema   . Stroke   . Chronic pain   . Cirrhosis of liver   . Fall   . Dental injury   . Bone spur of other site     Left Foot  . Impaired mobility   . Total self care deficit   . Pneumonia   . Emphysema   . ETOH abuse   . Elevated LFTs   . Arthritis   . Anxiety   . Sinusitis   . Anemia     iron def.  . Hard of hearing   . Diabetes mellitus without complication   . COPD (chronic obstructive pulmonary disease)   . Depression   . Glaucoma   . Emphysema of lung   . Cataract    Past Surgical History  Procedure Laterality Date  . Esophagogastroduodenoscopy  01/09/2012    Procedure: ESOPHAGOGASTRODUODENOSCOPY (EGD);  Surgeon: Beryle Beams, MD;  Location: [facility] ENDOSCOPY;  Service: Endoscopy;  Laterality: N/A;  . Multiple extractions with alveoloplasty  03/29/2012    Procedure: MULTIPLE EXTRACION WITH ALVEOLOPLASTY;  Surgeon:  Gae Bon, DDS;  Location: [place];  Service: Oral Surgery;  Laterality: Bilateral;  . Bone spur  2013    left spur  . Cataract extraction w/ intraocular lens implant    . Mouth surgery    . Colonoscopy     Family History  Problem Relation Age of Onset  . Breast cancer Mother   . Diabetes Maternal Aunt   . Heart disease Sister   . Heart disease Maternal Aunt    Social History  Substance Use Topics  . Smoking status: Current Every Day Smoker -- 1.00 packs/day for 30 years    Types: Cigarettes  . Smokeless tobacco: Current User  . Alcohol Use: Yes     Comment: says quit May 2016    Review of Systems  Respiratory: Negative for shortness of breath.   Cardiovascular: Negative for chest pain.  Gastrointestinal: Negative for abdominal pain.  Neurological: Negative for headaches.  All other systems reviewed and are negative.     Allergies  Aspirin and Poison ivy extract  Home Medications   Prior to Admission medications   Medication Sig Start Date End Date Taking? Authorizing Provider  acamprosate (CAMPRAL) 333 MG tablet Take 2 tablets (666 mg total) by mouth 3 (three) times daily with meals. 01/08/15   Robbie Lis, MD  acetaminophen (TYLENOL) 325 MG tablet Take 2 tablets (650 mg total) by mouth every 6 (six) hours as needed for mild pain (or Fever >/= 101). 01/08/15   Robbie Lis, MD  albuterol (PROVENTIL HFA;VENTOLIN HFA) 108 (90 BASE) MCG/ACT inhaler Inhale 2 puffs into the lungs 4 (four) times daily. Patient not taking: Reported on 01/11/2015 07/17/14   Lorayne Marek, MD  atorvastatin (LIPITOR) 80 MG tablet Take 1 tablet (80 mg total) by mouth daily. 01/11/15   Lorayne Marek, MD  brimonidine (ALPHAGAN) 0.2 % ophthalmic solution Place 1 drop into both eyes 2 (two) times daily. 12/15/13   Lorayne Marek, MD  budesonide-formoterol (SYMBICORT) 80-4.5 MCG/ACT inhaler Inhale 2 puffs into the lungs 2 (two) times daily. Patient not taking: Reported on 01/11/2015 07/17/14   Lorayne Marek,  MD  carvedilol (COREG) 3.125 MG tablet Take 1 tablet (3.125 mg total) by mouth 2 (two) times daily with a meal. 01/08/15   Robbie Lis, MD  furosemide (LASIX) 40 MG tablet Take 0.5 tablets (20 mg total) by mouth daily. 01/08/15   Robbie Lis, MD  gabapentin (NEURONTIN) 300 MG capsule Take 300 mg by mouth 2 (two) times daily.     Historical Provider, MD  HYDROcodone-acetaminophen (NORCO/VICODIN) 5-325 MG per tablet Take 1-2 tablets by mouth every 4 (four) hours as needed. 02/22/15   Dorie Rank, MD  ipratropium-albuterol (DUONEB) 0.5-2.5 (3) MG/3ML SOLN Take 3 mLs by nebulization every 4 (four) hours as needed. Patient not taking: Reported on 01/11/2015 05/28/14   Theodis Blaze, MD  nicotine (NICODERM CQ) 21 mg/24hr patch Place 1 patch (21 mg total) onto the skin daily. Patient not taking: Reported on 01/11/2015 12/21/14   Lorayne Marek, MD  nitrofurantoin, macrocrystal-monohydrate, (MACROBID) 100 MG capsule Take 1 capsule (100 mg total) by mouth 2 (two) times daily. 01/11/15   Lorayne Marek, MD  pantoprazole (PROTONIX) 40 MG tablet Take 1 tablet (40 mg total) by mouth daily. Patient not taking: Reported on 01/11/2015 07/17/14   Lorayne Marek, MD  potassium chloride (K-DUR) 10 MEQ tablet Take 1 tablet (10 mEq total) by mouth daily. 01/08/15   Robbie Lis, MD  tamsulosin (FLOMAX) 0.4 MG CAPS capsule Take 1 capsule (0.4 mg total) by mouth at bedtime. 01/11/15   Lorayne Marek, MD  traZODone (DESYREL) 50 MG tablet Take 1 tablet (50 mg total) by mouth at bedtime. 01/11/15   Lorayne Marek, MD  vitamin E 200 UNIT capsule Take 1 capsule (200 Units total) by mouth daily. Patient not taking: Reported on 01/11/2015 02/16/13   Tresa Garter, MD   BP 106/66 mmHg  Pulse 88  Temp(Src) 98.3 F (36.8 C) (Oral)  Resp 15  SpO2 100% Physical Exam  Constitutional: He appears well-developed and well-nourished. No distress.  HENT:  Head: Normocephalic and atraumatic.  Right Ear: External ear normal.  Left Ear: External ear  normal.  Eyes: Conjunctivae are normal. Right eye exhibits no discharge. Left eye exhibits no discharge. No scleral icterus.  Neck: Neck supple. No tracheal deviation present.  Cardiovascular: Normal rate, regular rhythm and normal heart sounds.   Pulmonary/Chest: Effort normal and breath sounds normal. No stridor. No respiratory distress.  Musculoskeletal: He exhibits no edema.       Left shoulder: He exhibits tenderness and bony tenderness. He exhibits no swelling, no deformity and no spasm.  Neurological: He is alert. Cranial nerve deficit: no gross deficits.  Skin: Skin is warm and dry. No rash noted.  Psychiatric: He has a normal  mood and affect.  Nursing note and vitals reviewed.   ED Course  Procedures (including critical care time) Labs Review Labs Reviewed - No data to display  Imaging Review Dg Shoulder Left  02/22/2015   CLINICAL DATA:  [place] with left shoulder pain.  EXAM: LEFT SHOULDER - 2+ VIEW  COMPARISON:  09/10/2010  FINDINGS: Old left clavicle fracture. There is a new displaced and mildly comminuted fracture of the proximal left humerus. Fracture appears to involve the surgical neck and probably the greater tuberosity. Left humeral head is located.  IMPRESSION: Displaced and mildly comminuted fracture of the proximal left humerus.   Electronically Signed   By: Markus Daft M.D.   On: 02/22/2015 18:45   I have personally reviewed and evaluated these images and lab results as part of my medical decision-making. Medications  morphine 4 MG/ML injection 4 mg (not administered)     MDM   Final diagnoses:  Humerus head fracture, left, closed, initial encounter    Patient's x-ray shows a proximal humerus fracture. He complains of no other injuries. Patient was placed in a sling. We'll discharge home with pain medications and follow-up with orthopedics.  Dorie Rank, MD 02/22/15 [id number]

## 2015-03-03 ENCOUNTER — Other Ambulatory Visit: Payer: Self-pay | Admitting: Internal Medicine

## 2015-07-19 IMAGING — CR DG CHEST 1V PORT
1 series · 1 of 1 positions shown · non-contrast
Comparison: 12/29/2014

CLINICAL DATA: Weakness and dizziness for 1 week. Emphysema. COPD.
Ethanol abuse.

EXAM:
PORTABLE CHEST - 1 VIEW

[AP]
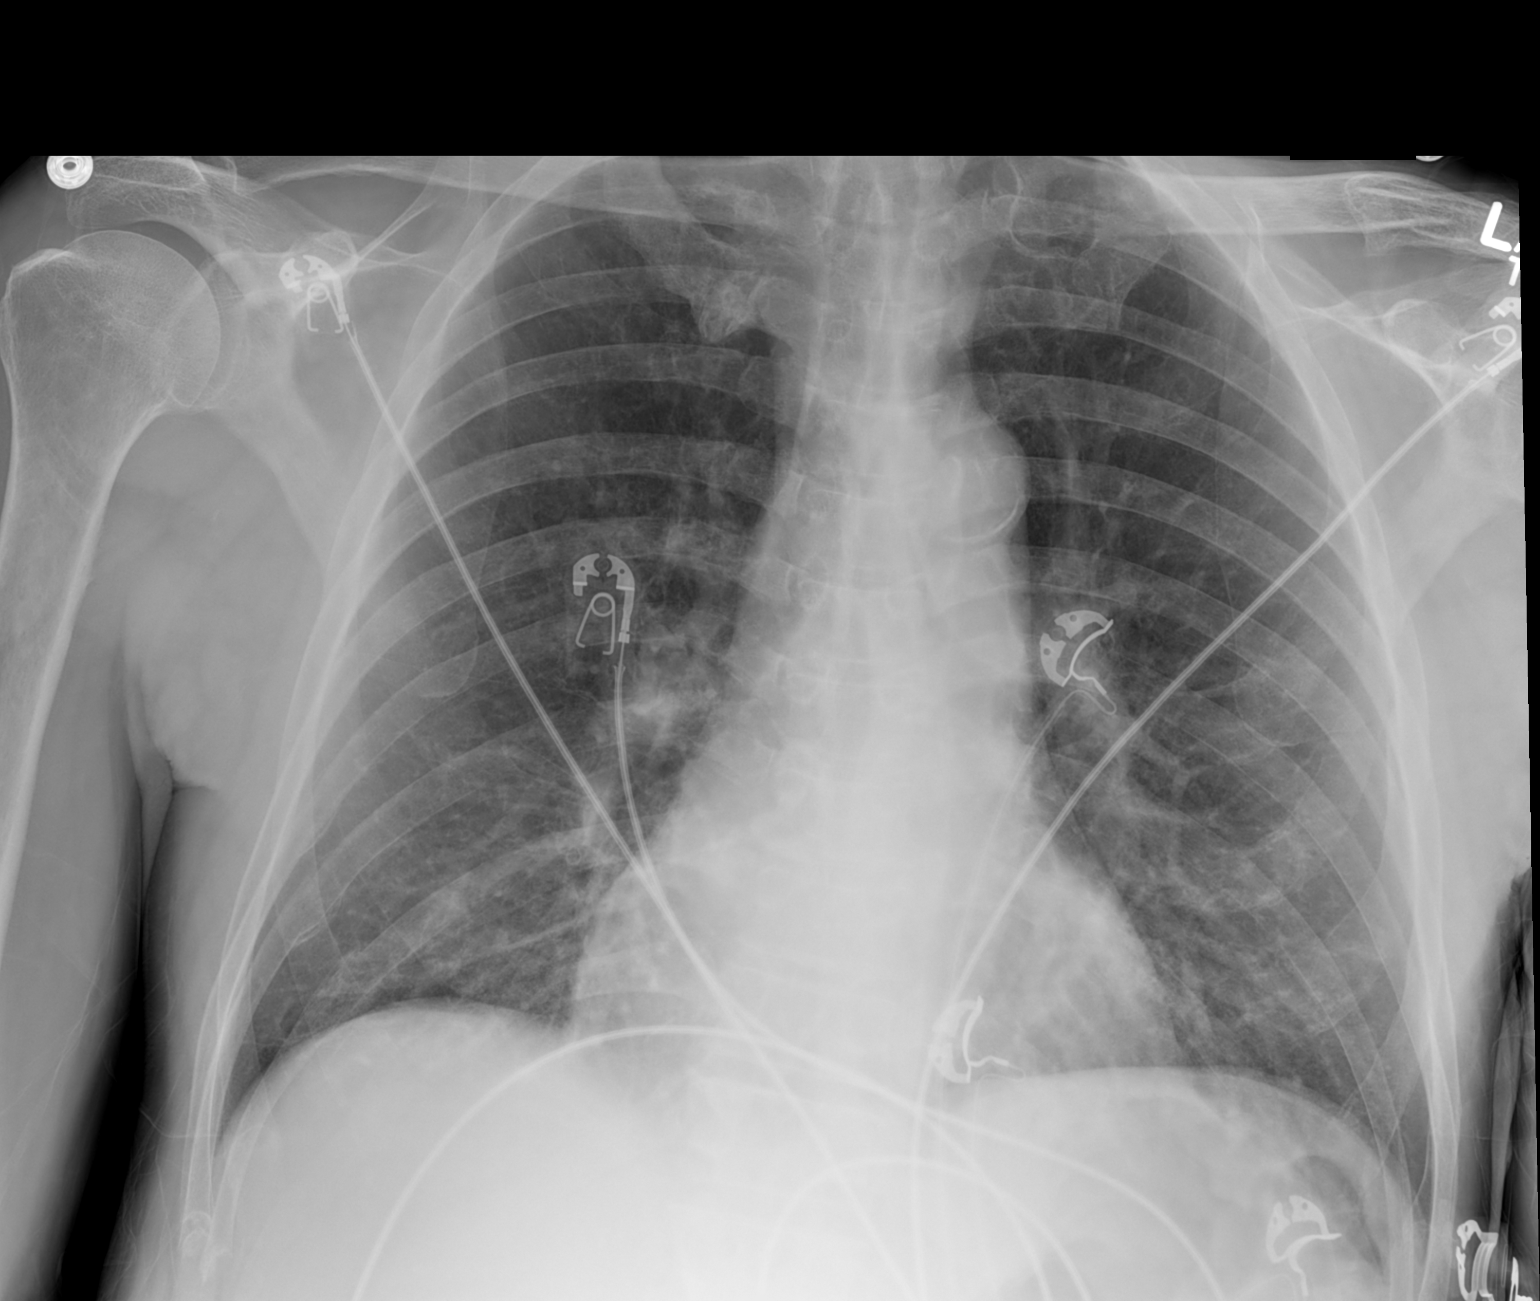

[1 of 1 positions shown; findings below may reference images not displayed]

FINDINGS: Remote right rib and left clavicular fractures. Midline trachea.
Normal heart size. Atherosclerosis in the transverse aorta. No
pleural effusion or pneumothorax. Diffuse peribronchial thickening.
No lobar consolidation.
IMPRESSION: COPD/chronic bronchitis.  No acute superimposed process.

Aortic atherosclerosis.

## 2015-08-28 ENCOUNTER — Emergency Department (HOSPITAL_COMMUNITY): Payer: Medicare Other

## 2015-08-28 ENCOUNTER — Encounter (HOSPITAL_COMMUNITY): Payer: Self-pay

## 2015-08-28 ENCOUNTER — Emergency Department (HOSPITAL_COMMUNITY)
Admission: EM | Admit: 2015-08-28 | Discharge: 2015-08-28 | Disposition: A | Discharge status: Home / Self Care | Payer: Medicare Other | Attending: Emergency Medicine | Admitting: Emergency Medicine

## 2015-08-28 DIAGNOSIS — M1990 Unspecified osteoarthritis, unspecified site: Secondary | ICD-10-CM | POA: Diagnosis not present

## 2015-08-28 DIAGNOSIS — G8929 Other chronic pain: Secondary | ICD-10-CM | POA: Diagnosis not present

## 2015-08-28 DIAGNOSIS — Z8719 Personal history of other diseases of the digestive system: Secondary | ICD-10-CM | POA: Insufficient documentation

## 2015-08-28 DIAGNOSIS — E785 Hyperlipidemia, unspecified: Secondary | ICD-10-CM | POA: Diagnosis not present

## 2015-08-28 DIAGNOSIS — Y998 Other external cause status: Secondary | ICD-10-CM | POA: Diagnosis not present

## 2015-08-28 DIAGNOSIS — Z862 Personal history of diseases of the blood and blood-forming organs and certain disorders involving the immune mechanism: Secondary | ICD-10-CM | POA: Insufficient documentation

## 2015-08-28 DIAGNOSIS — W1839XA Other fall on same level, initial encounter: Secondary | ICD-10-CM | POA: Insufficient documentation

## 2015-08-28 DIAGNOSIS — Z8673 Personal history of transient ischemic attack (TIA), and cerebral infarction without residual deficits: Secondary | ICD-10-CM | POA: Diagnosis not present

## 2015-08-28 DIAGNOSIS — E119 Type 2 diabetes mellitus without complications: Secondary | ICD-10-CM | POA: Diagnosis not present

## 2015-08-28 DIAGNOSIS — F419 Anxiety disorder, unspecified: Secondary | ICD-10-CM | POA: Diagnosis not present

## 2015-08-28 DIAGNOSIS — Y9389 Activity, other specified: Secondary | ICD-10-CM | POA: Insufficient documentation

## 2015-08-28 DIAGNOSIS — Y9289 Other specified places as the place of occurrence of the external cause: Secondary | ICD-10-CM | POA: Diagnosis not present

## 2015-08-28 DIAGNOSIS — S82142A Displaced bicondylar fracture of left tibia, initial encounter for closed fracture: Secondary | ICD-10-CM

## 2015-08-28 DIAGNOSIS — F329 Major depressive disorder, single episode, unspecified: Secondary | ICD-10-CM | POA: Insufficient documentation

## 2015-08-28 DIAGNOSIS — J439 Emphysema, unspecified: Secondary | ICD-10-CM | POA: Insufficient documentation

## 2015-08-28 DIAGNOSIS — F1410 Cocaine abuse, uncomplicated: Secondary | ICD-10-CM | POA: Diagnosis not present

## 2015-08-28 DIAGNOSIS — S8992XA Unspecified injury of left lower leg, initial encounter: Secondary | ICD-10-CM | POA: Diagnosis present

## 2015-08-28 DIAGNOSIS — Z8701 Personal history of pneumonia (recurrent): Secondary | ICD-10-CM | POA: Diagnosis not present

## 2015-08-28 DIAGNOSIS — I10 Essential (primary) hypertension: Secondary | ICD-10-CM | POA: Insufficient documentation

## 2015-08-28 DIAGNOSIS — Z79899 Other long term (current) drug therapy: Secondary | ICD-10-CM | POA: Diagnosis not present

## 2015-08-28 DIAGNOSIS — F17210 Nicotine dependence, cigarettes, uncomplicated: Secondary | ICD-10-CM | POA: Diagnosis not present

## 2015-08-28 LAB — CBC WITH DIFFERENTIAL/PLATELET
Basophils Absolute: 0 10*3/uL (ref 0.0–0.1)
Basophils Relative: 0 %
Eosinophils Absolute: 0 10*3/uL (ref 0.0–0.7)
Eosinophils Relative: 0 %
HEMATOCRIT: 36.5 % — AB (ref 39.0–52.0)
HEMOGLOBIN: 12.3 g/dL — AB (ref 13.0–17.0)
Lymphocytes Relative: 17 %
Lymphs Abs: 1.2 10*3/uL (ref 0.7–4.0)
MCH: 26.5 pg (ref 26.0–34.0)
MCHC: 33.7 g/dL (ref 30.0–36.0)
MCV: 78.5 fL (ref 78.0–100.0)
Monocytes Absolute: 0.9 10*3/uL (ref 0.1–1.0)
Monocytes Relative: 12 %
NEUTROS ABS: 5 10*3/uL (ref 1.7–7.7)
Neutrophils Relative %: 71 %
Platelets: 287 10*3/uL (ref 150–400)
RBC: 4.65 MIL/uL (ref 4.22–5.81)
RDW: 16.3 % — ABNORMAL HIGH (ref 11.5–15.5)
WBC: 7 10*3/uL (ref 4.0–10.5)

## 2015-08-28 LAB — BASIC METABOLIC PANEL
Anion gap: 16 — ABNORMAL HIGH (ref 5–15)
BUN: 11 mg/dL (ref 6–20)
CHLORIDE: 96 mmol/L — AB (ref 101–111)
CO2: 19 mmol/L — ABNORMAL LOW (ref 22–32)
Calcium: 9.4 mg/dL (ref 8.9–10.3)
Creatinine, Ser: 0.96 mg/dL (ref 0.61–1.24)
GFR calc Af Amer: >60 mL/min (ref >60)
GFR calc non Af Amer: >60 mL/min (ref >60)
Glucose, Bld: 97 mg/dL (ref 65–99)
POTASSIUM: 3.9 mmol/L (ref 3.5–5.1)
SODIUM: 131 mmol/L — AB (ref 135–145)

## 2015-08-28 LAB — RAPID URINE DRUG SCREEN, HOSP PERFORMED
AMPHETAMINES: NOT DETECTED
BENZODIAZEPINES: NOT DETECTED
Barbiturates: NOT DETECTED
Cocaine: POSITIVE — AB
Opiates: NOT DETECTED
TETRAHYDROCANNABINOL: NOT DETECTED

## 2015-08-28 LAB — ETHANOL: Alcohol, Ethyl (B): 54 mg/dL — ABNORMAL HIGH (ref <5)

## 2015-08-28 MED ORDER — OXYCODONE-ACETAMINOPHEN 5-325 MG PO TABS: 1.0000 | ORAL_TABLET | Freq: Four times a day (QID) | ORAL | Status: DC | PRN

## 2015-08-28 MED ORDER — HYDROMORPHONE HCL 1 MG/ML IJ SOLN
1.0000 mg | Freq: Once | INTRAMUSCULAR | Status: AC
  Administered 2015-08-28: 1 mg via INTRAVENOUS
  Filled 2015-08-28: qty 1

## 2015-08-28 MED ORDER — ONDANSETRON HCL 4 MG/2ML IJ SOLN
4.0000 mg | Freq: Once | INTRAMUSCULAR | Status: AC
  Administered 2015-08-28: 4 mg via INTRAVENOUS
  Filled 2015-08-28: qty 2

## 2015-08-28 MED ORDER — OXYCODONE-ACETAMINOPHEN 5-325 MG PO TABS
1.0000 | ORAL_TABLET | Freq: Once | ORAL | Status: AC
  Administered 2015-08-28: 1 via ORAL
  Filled 2015-08-28: qty 1

## 2015-08-28 NOTE — Discharge Instructions (Signed)
Keep an Ace wrap on your knee as well as a knee immobilizer on your knee until you follow-up with your orthopedist. Call this morning to schedule an appointment with Dr. Ninfa Linden for tomorrow. Do not put any weight on your left leg. He may take Percocet as needed for pain. Ice your knee 3-4 times per day for 15-20 minutes each time. Keep your leg elevated as much as possible. Return to the emergency department as needed if symptoms worsen.  Nondisplaced Tibial Plateau Fracture A tibial plateau fracture is a break in the bone that forms the bottom of your knee joint (tibia or shin bone). The lower end of your thigh bone (femur) forms the upper surface of your knee joint. The top of the tibia has a flat, smooth surface (tibial plateau). This part of the shin bone is made of softer bone than the shaft of the shin bone. If a strong force shoves your femur down into your tibial plateau, the tibial plateau can collapse or break away at the edges. This is also called an intra-articular fracture. A nondisplaced fracture means that the broken piece or pieces of your tibial plateau have not moved out of their normal position. This type of fracture can usually be treated without surgery. You will need to wear a brace or a splint and avoid using your knee to support your body weight while the fracture is healing. CAUSES Common causes of this type of fracture include:  Car accidents.  Jumps or falls from a significant height.  Injuries from high-energy sports or contact sports. RISK FACTORS You may be at higher risk for this type of fracture if:  You play high-energy sports or contact sports.  You have a history of bone infections.  You are an older person with a condition that causes weak bones (osteoporosis). SIGNS AND SYMPTOMS Signs and symptoms of a nondisplaced tibial plateau fracture begin immediately after the injury. They may include:  Pain that is worse when you use your knee to support your body  weight.  Swelling of your knee.  Bruising around your knee. DIAGNOSIS Your health care provider may suspect a nondisplaced tibial plateau fracture based on your signs and symptoms, especially if you had a recent injury. Your health care provider will also do a physical exam. This may include imaging tests such as:  X-ray of your knee. This is used to confirm the diagnosis.  CT scan or MRI. This checks to make sure that no bones have moved out of place and that there are no other injuries to your knee. TREATMENT Treatment for a nondisplaced tibial plateau fracture may involve wearing a brace or a splint to keep your leg in one position while it heals. You may also need to use crutches, a scooter, a walker, or a wheelchair so you can move around without using your injured leg to support your body weight. You may also be prescribed pain medicine. While your knee is healing, you may see a physical therapist. Your physical therapist will move your knee without putting weight on it (passive exercise). This helps to keep your knee from becoming stiff. After your leg has healed, your health care provider will show you how to do range-of-motion exercises to strengthen your knee muscles and prevent stiffness. HOME CARE INSTRUCTIONS If You Have a Brace or a Splint:  Wear it as directed by your health care provider. Remove it only as directed by your health care provider.  Loosen the brace or splint if  your toes become numb and tingle, or if they turn cold and blue. Bathing  Cover the brace or splint with a watertight plastic bag to protect it from water while you bathe or shower. Do not let the brace or splint get wet. Managing Pain, Stiffness, and Swelling  If directed, apply ice to the injured area:  Put ice in a plastic bag.  Place a towel between your skin and the bag.  Leave the ice on for 20 minutes, 2-3 times a day.  Move your toes and ankle often to avoid stiffness and to lessen  swelling.  Raise the injured area above the level of your heart while you are sitting or lying down. Driving  Do not drive or operate heavy machinery while taking pain medicine.  Do not drive while wearing a brace or a splint on a leg that you use for driving. Activity  Return to your normal activities as directed by your health care provider. Ask your health care provider what activities are safe for you.  Perform range-of-motion exercises only as directed by your health care provider. Safety  Do not use the injured limb to support your body weight until your health care provider says that you can. Use crutches, a scooter, a walker, or a wheelchair as directed by your health care provider. General Instructions  Keep the brace or splint clean and dry.  Do not use any tobacco products, including cigarettes, chewing tobacco, or electronic cigarettes. Tobacco can delay bone healing. If you need help quitting, ask your health care provider.  Take medicines only as directed by your health care provider.  Keep all follow-up visits as directed by your health care provider. This is important. SEEK MEDICAL CARE IF:  Your pain is getting worse.  Your pain medicine is not helping. SEEK IMMEDIATE MEDICAL CARE IF:  You have very bad pain in your injured leg.  You have swelling or redness in your foot that is getting worse.  You begin to lose feeling in your foot or your toes.  Your foot or your toes are cold or turn blue.   This information is not intended to replace advice given to you by your health care provider. Make sure you discuss any questions you have with your health care provider.   Document Released: 04/02/2005 Document Revised: 07/14/2014 Document Reviewed: 02/08/2014 Elsevier Interactive Patient Education [employer].

## 2015-08-28 NOTE — ED Provider Notes (Signed)
CSN: [id number]     Arrival date & time 08/28/15  0124 History   First MD Initiated Contact with Patient 08/28/15 0510     Chief Complaint  Patient presents with  . Knee Injury     (Consider location/radiation/quality/duration/timing/severity/associated sxs/prior Treatment) HPI Comments: 65 year old male with a history of hypertension, hyperlipidemia, stroke, liver cirrhosis, alcoholism, COPD, and anemia presents to the emergency department for evaluation of left knee pain which began at 81 yesterday. Patient states that he tied his dog to a tree yard and went to get a shovel. He states that, the next thing he knew, was that the dog was running towards him and passed him causing the change to come in contact with his left knee. Patient subsequently fell to the ground, though he denies direct impact to his knee at this time; believes he fell on his right side. He states that he has had constant 10/10 pain in his left knee with decreased range of motion. He tried alleviating his pain with crack cocaine at 1700, but this provided no relief. Patient also drank a half of a 40 ounce beer for pain without relief. He has been unable to bear weight since the incident. He reports that the pain radiates from his knee up to his left hip and down to his left ankle.  Patient has been seen in the past by Dr. Ninfa Linden at [facility] orthopedics. PCP - Dr. Annitta Needs   The history is provided by the patient. No language interpreter was used.    Past Medical History  Diagnosis Date  . Hypertension   . Hyperlipemia   . Emphysema   . Stroke ([place])   . Chronic pain   . Cirrhosis of liver ([place])   . Fall   . Dental injury   . Bone spur of other site     Left Foot  . Impaired mobility   . Total self care deficit   . Pneumonia   . Emphysema   . ETOH abuse   . Elevated LFTs   . Arthritis   . Anxiety   . Sinusitis   . Anemia     iron def.  . Hard of hearing   . Diabetes mellitus without complication ([place])    . COPD (chronic obstructive pulmonary disease) ([place])   . Depression   . Glaucoma   . Emphysema of lung ([place])   . Cataract    Past Surgical History  Procedure Laterality Date  . Esophagogastroduodenoscopy  01/09/2012    Procedure: ESOPHAGOGASTRODUODENOSCOPY (EGD);  Surgeon: Beryle Beams, MD;  Location: [facility] ENDOSCOPY;  Service: Endoscopy;  Laterality: N/A;  . Multiple extractions with alveoloplasty  03/29/2012    Procedure: MULTIPLE EXTRACION WITH ALVEOLOPLASTY;  Surgeon: Gae Bon, DDS;  Location: [place];  Service: Oral Surgery;  Laterality: Bilateral;  . Bone spur  2013    left spur  . Cataract extraction w/ intraocular lens implant    . Mouth surgery    . Colonoscopy     Family History  Problem Relation Age of Onset  . Breast cancer Mother   . Diabetes Maternal Aunt   . Heart disease Sister   . Heart disease Maternal Aunt    Social History  Substance Use Topics  . Smoking status: Current Every Day Smoker -- 1.00 packs/day for 30 years    Types: Cigarettes  . Smokeless tobacco: Current User  . Alcohol Use: Yes     Comment: says quit May 2016    Review  of Systems  Musculoskeletal: Positive for joint swelling and arthralgias.  Neurological: Negative for weakness and numbness.  All other systems reviewed and are negative.   Allergies  Aspirin and Poison ivy extract  Home Medications   Prior to Admission medications   Medication Sig Start Date End Date Taking? Authorizing Provider  acamprosate (CAMPRAL) 333 MG tablet Take 2 tablets (666 mg total) by mouth 3 (three) times daily with meals. 01/08/15   Robbie Lis, MD  acetaminophen (TYLENOL) 325 MG tablet Take 2 tablets (650 mg total) by mouth every 6 (six) hours as needed for mild pain (or Fever >/= 101). 01/08/15   Robbie Lis, MD  albuterol (PROVENTIL HFA;VENTOLIN HFA) 108 (90 BASE) MCG/ACT inhaler Inhale 2 puffs into the lungs 4 (four) times daily. Patient not taking: Reported on 01/11/2015 07/17/14   Lorayne Marek,  MD  atorvastatin (LIPITOR) 80 MG tablet Take 1 tablet (80 mg total) by mouth daily. 01/11/15   Lorayne Marek, MD  brimonidine (ALPHAGAN) 0.2 % ophthalmic solution Place 1 drop into both eyes 2 (two) times daily. 12/15/13   Lorayne Marek, MD  budesonide-formoterol (SYMBICORT) 80-4.5 MCG/ACT inhaler Inhale 2 puffs into the lungs 2 (two) times daily. Patient not taking: Reported on 01/11/2015 07/17/14   Lorayne Marek, MD  carvedilol (COREG) 3.125 MG tablet Take 1 tablet (3.125 mg total) by mouth 2 (two) times daily with a meal. 01/08/15   Robbie Lis, MD  furosemide (LASIX) 40 MG tablet Take 0.5 tablets (20 mg total) by mouth daily. 01/08/15   Robbie Lis, MD  gabapentin (NEURONTIN) 300 MG capsule Take 300 mg by mouth 2 (two) times daily.     Historical Provider, MD  HYDROcodone-acetaminophen (NORCO/VICODIN) 5-325 MG per tablet Take 1-2 tablets by mouth every 4 (four) hours as needed. 02/22/15   Dorie Rank, MD  ipratropium-albuterol (DUONEB) 0.5-2.5 (3) MG/3ML SOLN Take 3 mLs by nebulization every 4 (four) hours as needed. Patient not taking: Reported on 01/11/2015 05/28/14   Theodis Blaze, MD  nicotine (NICODERM CQ) 21 mg/24hr patch Place 1 patch (21 mg total) onto the skin daily. Patient not taking: Reported on 01/11/2015 12/21/14   Lorayne Marek, MD  nitrofurantoin, macrocrystal-monohydrate, (MACROBID) 100 MG capsule Take 1 capsule (100 mg total) by mouth 2 (two) times daily. 01/11/15   Lorayne Marek, MD  pantoprazole (PROTONIX) 40 MG tablet Take 1 tablet (40 mg total) by mouth daily. Patient not taking: Reported on 01/11/2015 07/17/14   Lorayne Marek, MD  potassium chloride (K-DUR) 10 MEQ tablet Take 1 tablet (10 mEq total) by mouth daily. 01/08/15   Robbie Lis, MD  tamsulosin (FLOMAX) 0.4 MG CAPS capsule TAKE 1 CAPSULE (0.4 MG TOTAL) BY MOUTH AT BEDTIME. 06/12/15   Tresa Garter, MD  traZODone (DESYREL) 50 MG tablet TAKE 1 TABLET (50 MG TOTAL) BY MOUTH AT BEDTIME. 06/12/15   Tresa Garter, MD  vitamin  E 200 UNIT capsule Take 1 capsule (200 Units total) by mouth daily. Patient not taking: Reported on 01/11/2015 02/16/13   Tresa Garter, MD   BP 122/80 mmHg  Pulse 104  Temp(Src) 97.9 F (36.6 C) (Oral)  Resp 16  Ht 5\' 7"  (1.702 m)  Wt 63.504 kg  BMI 21.92 kg/m2  SpO2 100%   Physical Exam  Constitutional: He is oriented to person, place, and time. He appears well-developed and well-nourished. No distress.  Nontoxic/nonseptic appearing  HENT:  Head: Normocephalic and atraumatic.  Eyes: Conjunctivae and EOM are normal.  No scleral icterus.  Neck: Normal range of motion.  Cardiovascular: Normal rate, regular rhythm and intact distal pulses.   DP and PT pulses 2+ in the LLE. Capillary refill intact in all digits of the LLE.  Pulmonary/Chest: Effort normal. No respiratory distress.  Respirations even and unlabored  Musculoskeletal:       Left knee: He exhibits decreased range of motion, swelling, effusion and bony tenderness. Tenderness found. Medial joint line tenderness noted.  Compartments soft in the LLE. Swelling and effusion to L knee. Decreased ROM secondary to pain. Mild crepitus, appreciated medially. No obvious deformity. Unable to coax patient to dorsi or plantar flex secondary to pain.  Neurological: He is alert and oriented to person, place, and time. He exhibits normal muscle tone. Coordination normal.  Skin: Skin is warm and dry. No rash noted. He is not diaphoretic. No erythema. No pallor.  Psychiatric: He has a normal mood and affect. His behavior is normal.  Nursing note and vitals reviewed.   ED Course  Procedures (including critical care time) Labs Review Labs Reviewed  CBC WITH DIFFERENTIAL/PLATELET - Abnormal; Notable for the following:    Hemoglobin 12.3 (*)    HCT 36.5 (*)    RDW 16.3 (*)    All other components within normal limits  ETHANOL  BASIC METABOLIC PANEL  URINE RAPID DRUG SCREEN, HOSP PERFORMED    Imaging Review Dg Knee Complete 4 Views  Left  08/28/2015  CLINICAL DATA:  Status post fall, after dog's chain wrapped around left leg. Left knee pain. Initial encounter. EXAM: LEFT KNEE - COMPLETE 4+ VIEW COMPARISON:  None. FINDINGS: There appears to be a mildly impacted and comminuted fracture involving the lateral tibial plateau, with fracture line extending through the tibial spine, concerning for a Schatzker type II or AO/OTA type B3 split depression fracture. An associated large knee joint effusion is noted. The patella is grossly unremarkable in appearance. IMPRESSION: 1. Mildly impacted and comminuted fracture involving the lateral tibial plateau, with associated fracture line extending to the tibial spine. Findings are concerning for a Schatzker type 2 or AO/OTA type B3 split depression fracture, with tibial spine involvement. 2. Large knee joint effusion noted. Electronically Signed   By: Garald Balding M.D.   On: 08/28/2015 03:19   I have personally reviewed and evaluated these images and lab results as part of my medical decision-making.   EKG Interpretation None      6:14 AM Spoke with Dr. Lorin Mercy regarding patient case including verbatim x-ray impression, patient's medical history, and substance use prior to arrival. Dr. Lorin Mercy agrees that surgery will likely be indicated, but he feels comfortable having the patient follow-up with Dr. Ninfa Linden in the office tomorrow. He requests that the patient be placed in an Ace wrap and knee immobilizer and given crutches for nonweightbearing. He requests that the CT be completed prior to patient discharge. MDM   Final diagnoses:  Tibial plateau fracture, left, closed, initial encounter    66 year old male presents to the emergency department for evaluation of left knee pain after a fall. Patient neurovascularly intact. Compartments soft. X-ray shows a mildly impacted and comminuted fracture of the lateral tibial plateau. Findings concerning for a Schatzker type II or AO/OTA type B3 split  depression fracture. Case discussed with Dr. Lorin Mercy who recommends office follow up tomorrow after CT completed. Patient to be placed in an ACE wrap and knee immobilizer. He is to be nonweightbearing. Plan to have f/u with Dr. Ninfa Linden on an outpatient basis.  Percocet prescribed for pain. Patient told to d/c use of illicit drugs; he verbalizes understanding to this effect.  Patient signed out to Shary Decamp, PA-C who will follow and disposition appropriately.   Filed Vitals:   08/28/15 0157  BP: 122/80  Pulse: 104  Temp: 97.9 F (36.6 C)  TempSrc: Oral  Resp: 16  Height: 5\' 7"  (1.702 m)  Weight: 63.504 kg  SpO2: 100%     Antonietta Breach, PA-C 08/28/15 [id number]  April Palumbo, MD 08/28/15 [phone]

## 2015-08-28 NOTE — ED Notes (Signed)
Ortho tech notified of leg wrap orders.

## 2015-08-28 NOTE — ED Notes (Signed)
Per EMS about 430 this afternoon he was sitting outside and his dogs chain got wrapped around his left leg and pulled  Pt is c/o pain from his left leg down  Pt has no abrasions noted  Some swelling  Pt states he has used alcohol and cocaine today to help with the pain but it did not help

## 2015-09-05 IMAGING — CR DG SHOULDER 2+V*L*
3 series · 3 of 3 positions shown · non-contrast
Comparison: 09/10/2010

CLINICAL DATA: Fell with left shoulder pain.

EXAM:
LEFT SHOULDER - 2+ VIEW

[x shoulder ap left (1 of 3)]
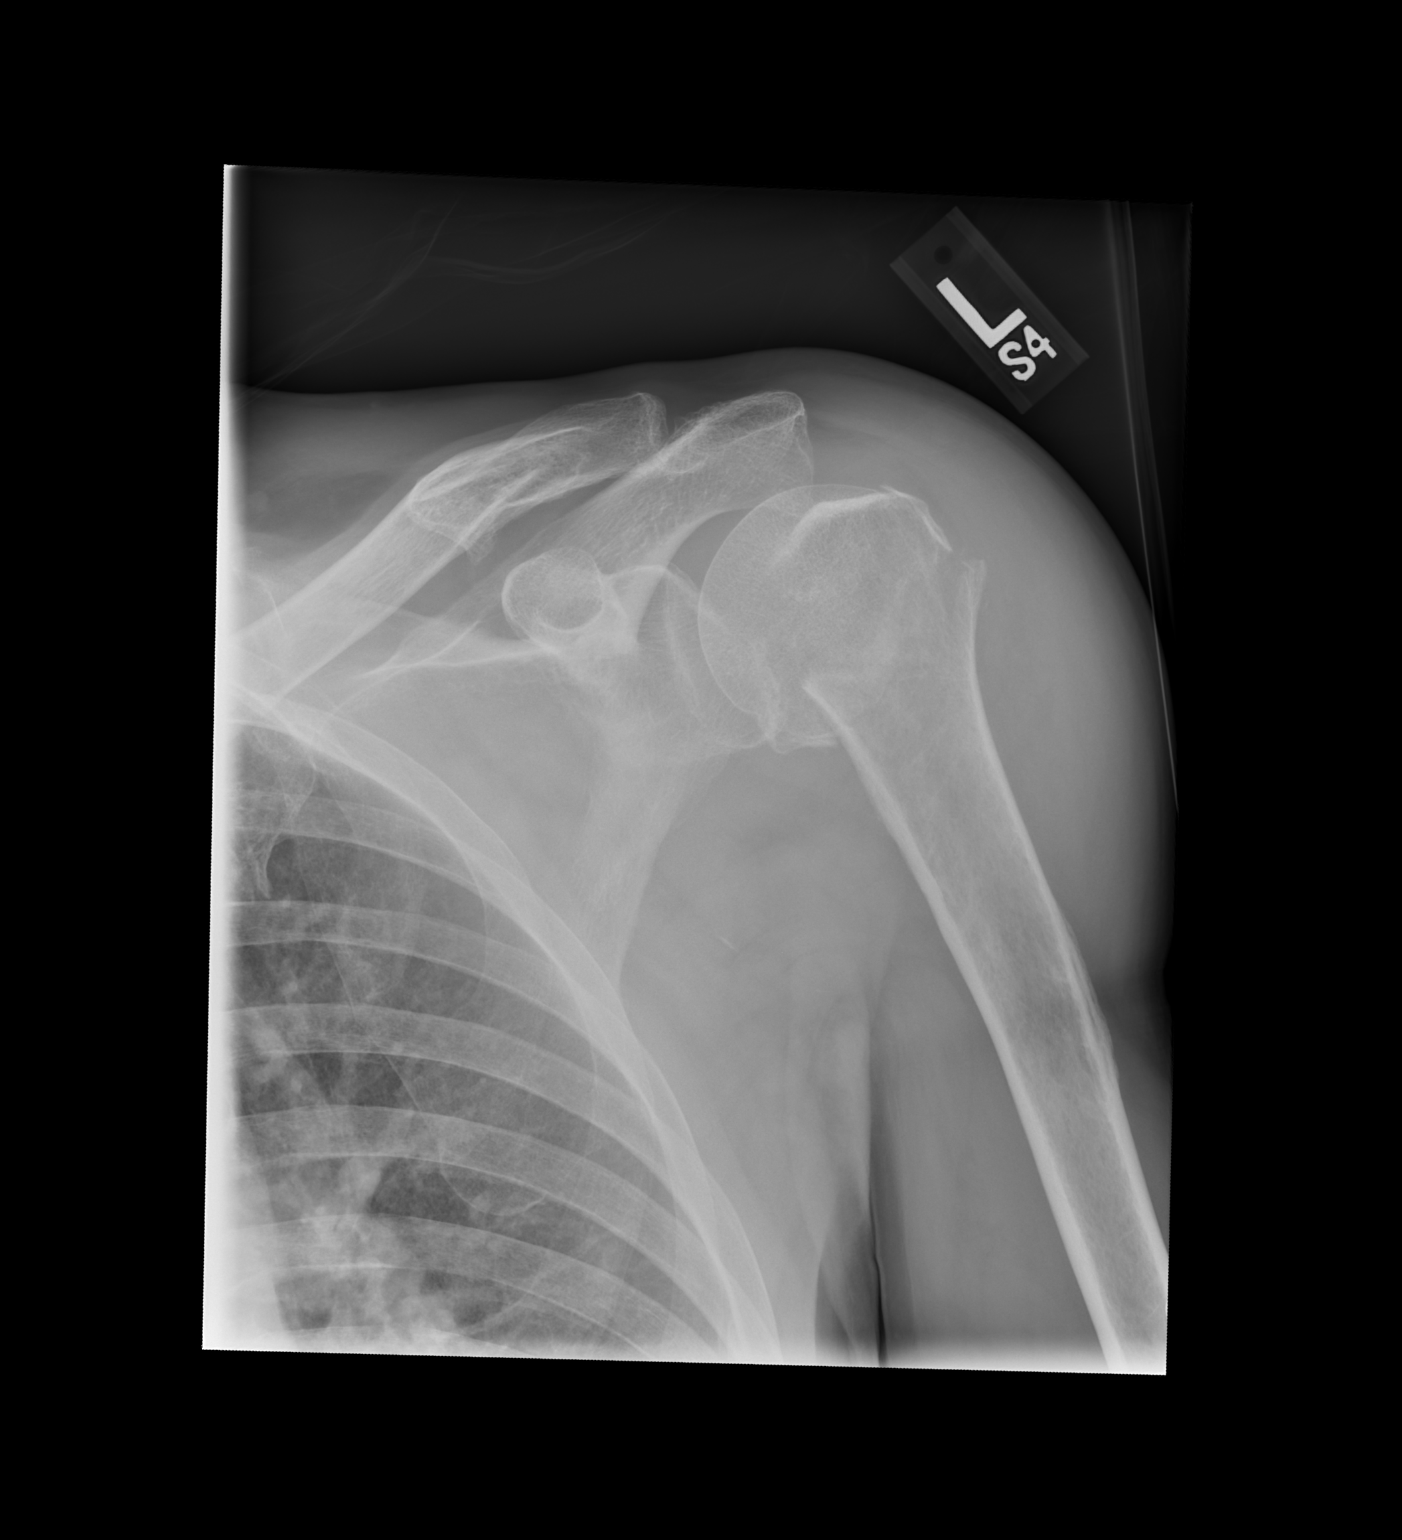

[x shoulder ap left (2 of 3)]
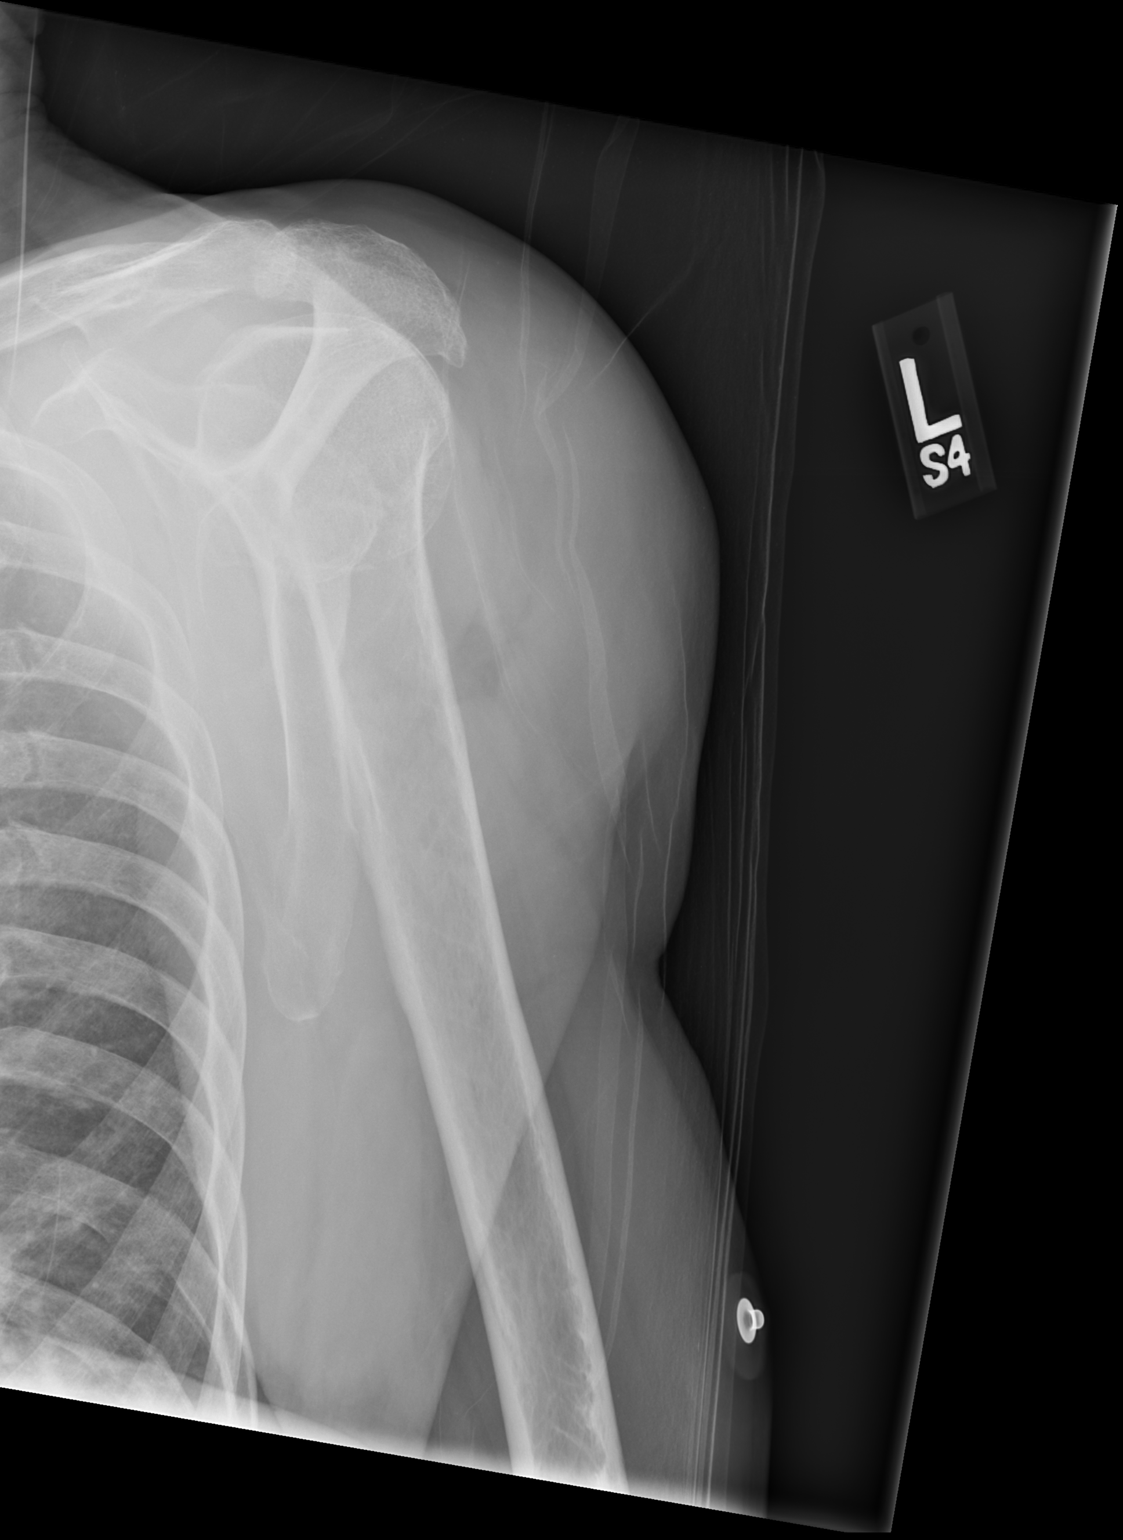

[x shoulder ap left (3 of 3)]
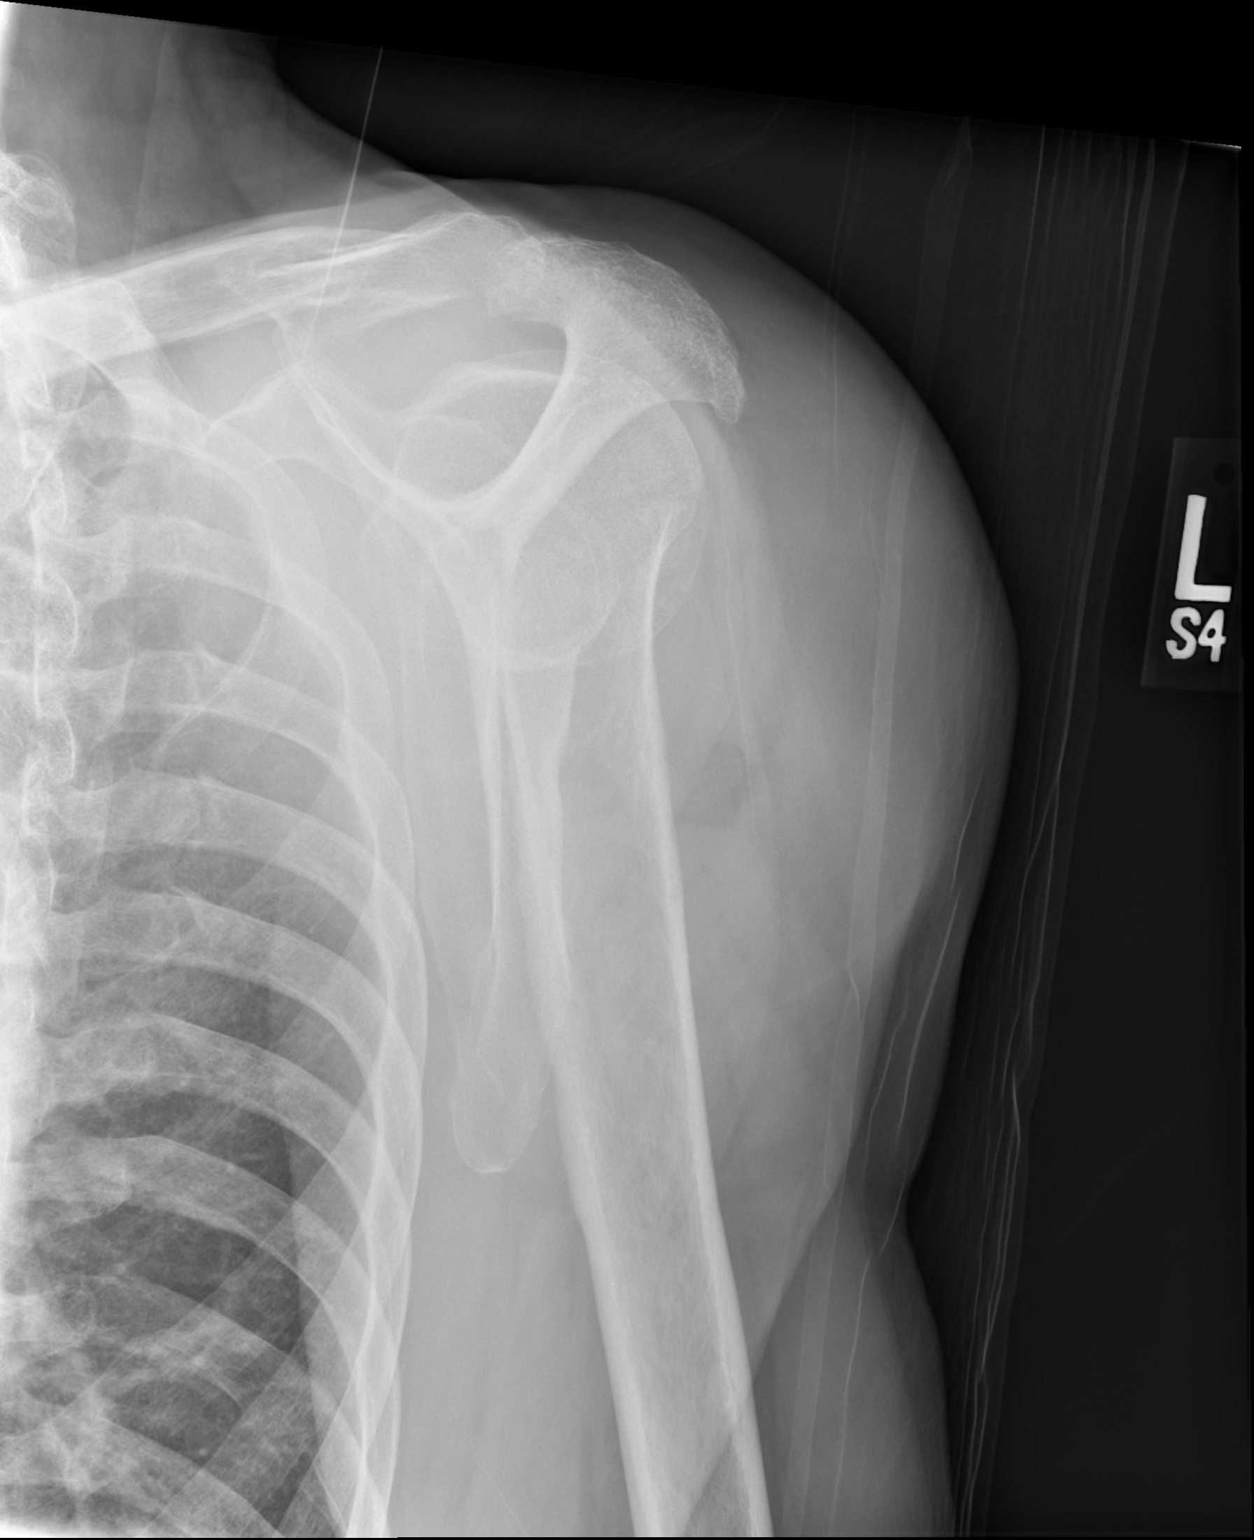

[3 of 3 positions shown; findings below may reference images not displayed]

FINDINGS: Old left clavicle fracture. There is a new displaced and mildly
comminuted fracture of the proximal left humerus. Fracture appears
to involve the surgical neck and probably the greater tuberosity.
Left humeral head is located.
IMPRESSION: Displaced and mildly comminuted fracture of the proximal left
humerus.

## 2016-04-08 ENCOUNTER — Emergency Department (HOSPITAL_COMMUNITY)
Admission: EM | Admit: 2016-04-08 | Discharge: 2016-04-08 | Disposition: A | Discharge status: Home / Self Care | Payer: Medicare Other | Attending: Emergency Medicine | Admitting: Emergency Medicine

## 2016-04-08 ENCOUNTER — Emergency Department (HOSPITAL_COMMUNITY): Payer: Medicare Other

## 2016-04-08 ENCOUNTER — Encounter (HOSPITAL_COMMUNITY): Payer: Self-pay | Admitting: Emergency Medicine

## 2016-04-08 DIAGNOSIS — J449 Chronic obstructive pulmonary disease, unspecified: Secondary | ICD-10-CM | POA: Insufficient documentation

## 2016-04-08 DIAGNOSIS — Z79899 Other long term (current) drug therapy: Secondary | ICD-10-CM | POA: Diagnosis not present

## 2016-04-08 DIAGNOSIS — F17210 Nicotine dependence, cigarettes, uncomplicated: Secondary | ICD-10-CM | POA: Diagnosis not present

## 2016-04-08 DIAGNOSIS — E119 Type 2 diabetes mellitus without complications: Secondary | ICD-10-CM | POA: Diagnosis not present

## 2016-04-08 DIAGNOSIS — M79622 Pain in left upper arm: Secondary | ICD-10-CM

## 2016-04-08 DIAGNOSIS — I110 Hypertensive heart disease with heart failure: Secondary | ICD-10-CM | POA: Diagnosis not present

## 2016-04-08 DIAGNOSIS — I5020 Unspecified systolic (congestive) heart failure: Secondary | ICD-10-CM | POA: Diagnosis not present

## 2016-04-08 DIAGNOSIS — M79602 Pain in left arm: Secondary | ICD-10-CM

## 2016-04-08 DIAGNOSIS — Z8673 Personal history of transient ischemic attack (TIA), and cerebral infarction without residual deficits: Secondary | ICD-10-CM | POA: Insufficient documentation

## 2016-04-08 DIAGNOSIS — M25512 Pain in left shoulder: Secondary | ICD-10-CM | POA: Diagnosis not present

## 2016-04-08 LAB — COMPREHENSIVE METABOLIC PANEL
ALBUMIN: 3.8 g/dL (ref 3.5–5.0)
ALT: 12 U/L — ABNORMAL LOW (ref 17–63)
ANION GAP: 9 (ref 5–15)
AST: 21 U/L (ref 15–41)
Alkaline Phosphatase: 76 U/L (ref 38–126)
BUN: 11 mg/dL (ref 6–20)
CO2: 22 mmol/L (ref 22–32)
Calcium: 8.7 mg/dL — ABNORMAL LOW (ref 8.9–10.3)
Chloride: 99 mmol/L — ABNORMAL LOW (ref 101–111)
Creatinine, Ser: 0.87 mg/dL (ref 0.61–1.24)
GFR calc non Af Amer: >60 mL/min (ref >60)
GLUCOSE: 110 mg/dL — AB (ref 65–99)
POTASSIUM: 4.2 mmol/L (ref 3.5–5.1)
SODIUM: 130 mmol/L — AB (ref 135–145)
Total Bilirubin: 0.4 mg/dL (ref 0.3–1.2)
Total Protein: 7.5 g/dL (ref 6.5–8.1)

## 2016-04-08 LAB — CBC
HCT: 35.7 % — ABNORMAL LOW (ref 39.0–52.0)
Hemoglobin: 12.2 g/dL — ABNORMAL LOW (ref 13.0–17.0)
MCH: 25.1 pg — ABNORMAL LOW (ref 26.0–34.0)
MCHC: 34.2 g/dL (ref 30.0–36.0)
MCV: 73.3 fL — AB (ref 78.0–100.0)
PLATELETS: 344 10*3/uL (ref 150–400)
RBC: 4.87 MIL/uL (ref 4.22–5.81)
RDW: 15.4 % (ref 11.5–15.5)
WBC: 2.7 10*3/uL — AB (ref 4.0–10.5)

## 2016-04-08 NOTE — ED Triage Notes (Signed)
Pt c/o sharp left arm pain from left scapula to left fingers with new numbness to left fingers, onset 3 days ago on waking. Worse with movement or palpation. No injury.

## 2016-04-08 NOTE — Discharge Instructions (Signed)
Wear the sling as needed for comfort. You can take Tylenol 650 mg every 4 hours as needed for pain. Avoid alcohol. Call Dr.Whitfield schedule office visit if having significant pain in the next few days. Return if concern for any reason.

## 2016-04-08 NOTE — ED Notes (Signed)
Pt denies injury/trauma to left shoulder.  Pt stated "I just woke up with my shoulder hurting."

## 2016-04-08 NOTE — ED Notes (Signed)
Bed: WA03 Expected date:  Expected time:  Means of arrival:  Comments: 

## 2016-04-08 NOTE — ED Provider Notes (Addendum)
[facility] DEPT Provider Note   CSN: [id number] Arrival date & time: 04/08/16  1247     History   Chief Complaint Chief Complaint  Patient presents with  . Arm Pain  . Numbness    HPI Peter [name] is a 65 y.o. male.Complains of pain at right scapula rating to right upper arm and right forearm onset 3 days ago. Pain is worse with movement of shoulder and improved with remaining still. He denies any chest pain denies fever denies injury. No other associated symptoms he treated himself with an Ace wrap with partial relief and with 1 ibuprofen tablet with minimal relief. Denies chest pain denies abdominal pain denies fever No other associated symptoms. Denies numbness HPI  Past Medical History:  Diagnosis Date  . Anemia    iron def.  . Anxiety   . Arthritis   . Bone spur of other site    Left Foot  . Cataract   . Chronic pain   . Cirrhosis of liver ([place])   . COPD (chronic obstructive pulmonary disease) ([place])   . Dental injury   . Depression   . Diabetes mellitus without complication ([place])   . Elevated LFTs   . Emphysema   . Emphysema   . Emphysema of lung ([place])   . ETOH abuse   . Fall   . Glaucoma   . Hard of hearing   . Hyperlipemia   . Hypertension   . Impaired mobility   . Pneumonia   . Sinusitis   . Stroke ([place])   . Total self care deficit     Patient Active Problem List   Diagnosis Date Noted  . Hypotension, postural 01/05/2015  . UTI (lower urinary tract infection) 01/05/2015  . Acute renal failure ([place]) 01/05/2015  . ARF (acute renal failure) ([place]) 01/05/2015  . Closed nondisplaced fracture of greater trochanter of right femur with routine healing 11/23/2014  . Alcohol withdrawal ([place]) 11/21/2014  . Acute confusional state 11/21/2014  . Sepsis ([place]) 05/25/2014  . Fever 05/25/2014  . Malnutrition of moderate degree ([place]) 05/25/2014  . Alcohol abuse   . Smoker 12/15/2013  . Acute respiratory failure ([place]) 10/28/2013  . Delirium tremens ([place])  10/28/2013  . Acute respiratory distress syndrome (ARDS) ([place]) 10/28/2013  . Systolic CHF ([place]) [id number]  . Tobacco abuse 10/27/2013  . HCAP (healthcare-associated pneumonia) 10/25/2013  . Weakness 08/01/2013  . Dizziness 04/06/2013  . Chronic alcohol abuse 02/16/2013  . Liver cirrhosis, alcoholic ([place]) [id number]  . Depression (emotion) 02/16/2013  . COPD, moderate ([place]) 02/16/2013  . Aspiration pneumonia ([place]) 01/16/2013  . UTI (urinary tract infection) 01/16/2013  . Hypokalemia 01/15/2013  . Hypomagnesemia 01/15/2013  . Fever, unspecified 01/15/2013  . Leukopenia 01/13/2013  . Anemia 01/13/2013  . Orthostatic hypotension 01/13/2013  . Alcohol intoxication ([place]) 01/12/2013  . Syncope 01/12/2013  . Lactic acidosis 01/12/2013  . Cirrhosis ([place]) 01/12/2013  . History of stroke 01/12/2013  . Acute alcohol intoxication ([place]) 08/30/2012  . Iron deficiency 02/04/2012  . Hyponatremia 01/31/2012  . COPD (chronic obstructive pulmonary disease) ([place]) 01/03/2012    Past Surgical History:  Procedure Laterality Date  . bone spur  2013   left spur  . CATARACT EXTRACTION W/ INTRAOCULAR LENS IMPLANT    . COLONOSCOPY    . ESOPHAGOGASTRODUODENOSCOPY  01/09/2012   Procedure: ESOPHAGOGASTRODUODENOSCOPY (EGD);  Surgeon: Beryle Beams, MD;  Location: [facility] ENDOSCOPY;  Service: Endoscopy;  Laterality: N/A;  . MOUTH SURGERY    . MULTIPLE EXTRACTIONS WITH  ALVEOLOPLASTY  03/29/2012   Procedure: MULTIPLE EXTRACION WITH ALVEOLOPLASTY;  Surgeon: Gae Bon, DDS;  Location: [facility];  Service: Oral Surgery;  Laterality: Bilateral;       Home Medications    Prior to Admission medications   Medication Sig Start Date End Date Taking? Authorizing Provider  acamprosate (CAMPRAL) 333 MG tablet Take 2 tablets (666 mg total) by mouth 3 (three) times daily with meals. Patient not taking: Reported on 04/08/2016 01/08/15   Robbie Lis, MD  acetaminophen (TYLENOL) 325 MG tablet Take 2 tablets (650 mg total)  by mouth every 6 (six) hours as needed for mild pain (or Fever >/= 101). Patient not taking: Reported on 04/08/2016 01/08/15   Robbie Lis, MD  albuterol (PROVENTIL HFA;VENTOLIN HFA) 108 (90 BASE) MCG/ACT inhaler Inhale 2 puffs into the lungs 4 (four) times daily. Patient not taking: Reported on 04/08/2016 07/17/14   Lorayne Marek, MD  atorvastatin (LIPITOR) 80 MG tablet Take 1 tablet (80 mg total) by mouth daily. Patient not taking: Reported on 04/08/2016 01/11/15   Lorayne Marek, MD  brimonidine (ALPHAGAN) 0.2 % ophthalmic solution Place 1 drop into both eyes 2 (two) times daily. Patient not taking: Reported on 04/08/2016 12/15/13   Lorayne Marek, MD  budesonide-formoterol (SYMBICORT) 80-4.5 MCG/ACT inhaler Inhale 2 puffs into the lungs 2 (two) times daily. Patient not taking: Reported on 04/08/2016 07/17/14   Lorayne Marek, MD  carvedilol (COREG) 3.125 MG tablet Take 1 tablet (3.125 mg total) by mouth 2 (two) times daily with a meal. Patient not taking: Reported on 04/08/2016 01/08/15   Robbie Lis, MD  furosemide (LASIX) 40 MG tablet Take 0.5 tablets (20 mg total) by mouth daily. Patient not taking: Reported on 04/08/2016 01/08/15   Robbie Lis, MD  ipratropium-albuterol (DUONEB) 0.5-2.5 (3) MG/3ML SOLN Take 3 mLs by nebulization every 4 (four) hours as needed. Patient not taking: Reported on 04/08/2016 05/28/14   Theodis Blaze, MD  nicotine (NICODERM CQ) 21 mg/24hr patch Place 1 patch (21 mg total) onto the skin daily. Patient not taking: Reported on 04/08/2016 12/21/14   Lorayne Marek, MD  pantoprazole (PROTONIX) 40 MG tablet Take 1 tablet (40 mg total) by mouth daily. Patient not taking: Reported on 04/08/2016 07/17/14   Lorayne Marek, MD  potassium chloride (K-DUR) 10 MEQ tablet Take 1 tablet (10 mEq total) by mouth daily. Patient not taking: Reported on 04/08/2016 01/08/15   Robbie Lis, MD  tamsulosin (FLOMAX) 0.4 MG CAPS capsule TAKE 1 CAPSULE (0.4 MG TOTAL) BY MOUTH AT BEDTIME. Patient not taking:  Reported on 04/08/2016 06/12/15   Tresa Garter, MD  traZODone (DESYREL) 50 MG tablet TAKE 1 TABLET (50 MG TOTAL) BY MOUTH AT BEDTIME. Patient not taking: Reported on 04/08/2016 06/12/15   Tresa Garter, MD  vitamin E 200 UNIT capsule Take 1 capsule (200 Units total) by mouth daily. Patient not taking: Reported on 04/08/2016 02/16/13   Tresa Garter, MD    Family History Family History  Problem Relation Age of Onset  . Breast cancer Mother   . Diabetes Maternal Aunt   . Heart disease Sister   . Heart disease Maternal Aunt     Social History Social History  Substance Use Topics  . Smoking status: Current Every Day Smoker    Packs/day: 1.00    Years: 30.00    Types: Cigarettes  . Smokeless tobacco: Current User  . Alcohol use Yes     Comment: says quit May  2016   Heavy alcohol abuse (3) Admits to crack cocaine use, no IV drug use Allergies   Aspirin and Poison ivy extract [poison ivy extract]   Review of Systems Review of Systems  Constitutional: Negative.   HENT: Negative.   Respiratory: Negative.   Cardiovascular: Negative.   Gastrointestinal: Negative.   Musculoskeletal: Positive for arthralgias.       Left shoulder pain  Skin: Negative.   Allergic/Immunologic: Positive for immunocompromised state.       Heavy alcohol usage  Neurological: Negative.   Psychiatric/Behavioral: Negative.   All other systems reviewed and are negative.    Physical Exam Updated Vital Signs BP 109/72 (BP Location: Left Arm)   Pulse 112   Temp 98.6 F (37 C) (Oral)   Resp 16   SpO2 96%   Physical Exam  Constitutional: He appears well-developed and well-nourished. No distress.  HENT:  Head: Normocephalic and atraumatic.  Eyes: Conjunctivae are normal. Pupils are equal, round, and reactive to light.  Neck: Neck supple. No tracheal deviation present. No thyromegaly present.  Cardiovascular: Normal rate and regular rhythm.   No murmur heard. Pulmonary/Chest: Effort  normal and breath sounds normal.  Abdominal: Soft. Bowel sounds are normal. He exhibits no distension. There is no tenderness.  Musculoskeletal: Normal range of motion. He exhibits no edema or tenderness.  Left upper extremity no redness no swelling no point tenderness. Radial pulse 2+. Good capillary refill. Full range of motion with pain at shoulder on active motion  Neurological: He is alert. Coordination normal.  Skin: Skin is warm and dry. No rash noted.  Psychiatric: He has a normal mood and affect.  Nursing note and vitals reviewed.    ED Treatments / Results  Labs (all labs ordered are listed, but only abnormal results are displayed) Labs Reviewed - No data to display  EKG  EKG Interpretation  Date/Time:  Tuesday April 08 2016 18:21:11 EDT Ventricular Rate:  92 PR Interval:    QRS Duration: 92 QT Interval:  364 QTC Calculation: 451 R Axis:   84 Text Interpretation:  Sinus rhythm Borderline right axis deviation No significant change since last tracing Confirmed by Winfred Leeds  MD, Haden Suder [phone]) on 04/08/2016 6:28:21 PM      X-rays viewed by me Radiology Dg Chest 2 View  Result Date: 04/08/2016 CLINICAL DATA:  Left arm pain. EXAM: CHEST  2 VIEW COMPARISON:  01/05/2015.  12/29/2014.  05/29/2014.  05/25/2014. FINDINGS: Mediastinum and hilar structures are normal. Lungs are clear. No pleural effusion or pneumothorax. EKG pads noted over the left chest. Degenerative changes thoracic spine with prominent osteophytes. Thoracic scoliosis. IMPRESSION: No acute cardiopulmonary disease. Electronically Signed   By: Marcello Moores  Register   On: 04/08/2016 13:38   Results for orders placed or performed during the hospital encounter of 04/08/16  CBC  Result Value Ref Range   WBC 2.7 (L) 4.0 - 10.5 K/uL   RBC 4.87 4.22 - 5.81 MIL/uL   Hemoglobin 12.2 (L) 13.0 - 17.0 g/dL   HCT 35.7 (L) 39.0 - 52.0 %   MCV 73.3 (L) 78.0 - 100.0 fL   MCH 25.1 (L) 26.0 - 34.0 pg   MCHC 34.2 30.0 - 36.0 g/dL     RDW 15.4 11.5 - 15.5 %   Platelets 344 150 - 400 K/uL  Comprehensive metabolic panel  Result Value Ref Range   Sodium 130 (L) 135 - 145 mmol/L   Potassium 4.2 3.5 - 5.1 mmol/L   Chloride 99 (L) 101 -  111 mmol/L   CO2 22 22 - 32 mmol/L   Glucose, Bld 110 (H) 65 - 99 mg/dL   BUN 11 6 - 20 mg/dL   Creatinine, Ser 0.87 0.61 - 1.24 mg/dL   Calcium 8.7 (L) 8.9 - 10.3 mg/dL   Total Protein 7.5 6.5 - 8.1 g/dL   Albumin 3.8 3.5 - 5.0 g/dL   AST 21 15 - 41 U/L   ALT 12 (L) 17 - 63 U/L   Alkaline Phosphatase 76 38 - 126 U/L   Total Bilirubin 0.4 0.3 - 1.2 mg/dL   GFR calc non Af Amer >60 >60 mL/min   GFR calc Af Amer >60 >60 mL/min   Anion gap 9 5 - 15   Dg Chest 2 View  Result Date: 04/08/2016 CLINICAL DATA:  Left arm pain. EXAM: CHEST  2 VIEW COMPARISON:  01/05/2015.  12/29/2014.  05/29/2014.  05/25/2014. FINDINGS: Mediastinum and hilar structures are normal. Lungs are clear. No pleural effusion or pneumothorax. EKG pads noted over the left chest. Degenerative changes thoracic spine with prominent osteophytes. Thoracic scoliosis. IMPRESSION: No acute cardiopulmonary disease. Electronically Signed   By: Marcello Moores  Register   On: 04/08/2016 13:38   Dg Shoulder Left  Result Date: 04/08/2016 CLINICAL DATA:  65 y/o M; left shoulder and humerus pain with numbness. EXAM: LEFT SHOULDER - 2+ VIEW COMPARISON:  02/22/2015 left shoulder radiographs. FINDINGS: Chronic fracture deformity of the left proximal humerus with angulation and impaction demonstrating interval healing in comparison with prior shoulder radiographs. Mild osteoarthrosis of the acromioclavicular joint. No dislocation. No new acute fracture is identified. IMPRESSION: Chronic fracture deformity of left proximal humerus. No shoulder dislocation. No new acute fracture is identified. Electronically Signed   By: Kristine Garbe M.D.   On: 04/08/2016 18:22   Dg Humerus Left  Result Date: 04/08/2016 CLINICAL DATA:  65 y/o M; sharp left  shoulder to humerus pain with numbness in fingers. EXAM: LEFT HUMERUS - 2+ VIEW COMPARISON:  02/22/2015 left shoulder radiograph. FINDINGS: Fracture deformity of the left proximal humerus in a configuration similar to prior shoulder radiographs where the fracture was acute. No new fracture is identified. No definite shoulder dislocation. Mild osteoarthrosis of the acromioclavicular joints. IMPRESSION: Chronic fracture deformity of the left proximal humerus. No acute fracture is identified. No definite shoulder dislocation. Electronically Signed   By: Kristine Garbe M.D.   On: 04/08/2016 18:21   Procedures Procedures (including critical care time)  Medications Ordered in ED Medications - No data to display   Initial Impression / Assessment and Plan / ED Course  I have reviewed the triage vital signs and the nursing notes.  Pertinent labs & imaging results that were available during my care of the patient were reviewed by me and considered in my medical decision making (see chart for details).  Clinical Course   No signs of infection or vascular compromise or DVT 9 PM patient is resting comfortably and discomfort of left upper extremity is minimal. Case discussed with Dr. Durward Fortes plan sling follow-up at [facility] orthopedics if needed. He can take Tylenol 650 every 4 hours when necessary pain  Final Clinical Impressions(s) / ED Diagnoses  Diagnosis pain in left upper extremity Final diagnoses:  None    New Prescriptions New Prescriptions   No medications on file     Orlie Dakin, MD 04/08/16 2107    Orlie Dakin, MD 04/08/16 2108

## 2016-04-08 NOTE — ED Notes (Signed)
Pt removed sling prior to leaving room & stated "I know how to put it back on"

## 2017-04-16 ENCOUNTER — Emergency Department (HOSPITAL_COMMUNITY): Payer: Medicare Other

## 2017-04-16 ENCOUNTER — Inpatient Hospital Stay (HOSPITAL_COMMUNITY): Payer: Medicare Other

## 2017-04-16 ENCOUNTER — Inpatient Hospital Stay (HOSPITAL_COMMUNITY): Payer: Medicare Other | Admitting: Anesthesiology

## 2017-04-16 ENCOUNTER — Encounter (HOSPITAL_COMMUNITY): Payer: Self-pay

## 2017-04-16 ENCOUNTER — Inpatient Hospital Stay (HOSPITAL_COMMUNITY)
Admission: EM | Admit: 2017-04-16 | Discharge: 2017-04-21 | DRG: 493 | Disposition: A | Discharge status: Skilled Nursing Facility | Payer: Medicare Other | Attending: Internal Medicine | Admitting: Internal Medicine

## 2017-04-16 ENCOUNTER — Encounter (HOSPITAL_COMMUNITY)
Admission: EM | Disposition: A | Discharge status: Skilled Nursing Facility | Payer: Self-pay | Source: Home / Self Care | Attending: Oncology

## 2017-04-16 DIAGNOSIS — R5082 Postprocedural fever: Secondary | ICD-10-CM | POA: Diagnosis not present

## 2017-04-16 DIAGNOSIS — F1410 Cocaine abuse, uncomplicated: Secondary | ICD-10-CM

## 2017-04-16 DIAGNOSIS — Z590 Homelessness: Secondary | ICD-10-CM

## 2017-04-16 DIAGNOSIS — S82401A Unspecified fracture of shaft of right fibula, initial encounter for closed fracture: Secondary | ICD-10-CM | POA: Diagnosis not present

## 2017-04-16 DIAGNOSIS — M1990 Unspecified osteoarthritis, unspecified site: Secondary | ICD-10-CM | POA: Diagnosis not present

## 2017-04-16 DIAGNOSIS — S92351A Displaced fracture of fifth metatarsal bone, right foot, initial encounter for closed fracture: Secondary | ICD-10-CM

## 2017-04-16 DIAGNOSIS — M79604 Pain in right leg: Secondary | ICD-10-CM | POA: Diagnosis not present

## 2017-04-16 DIAGNOSIS — S4992XA Unspecified injury of left shoulder and upper arm, initial encounter: Secondary | ICD-10-CM | POA: Diagnosis not present

## 2017-04-16 DIAGNOSIS — S299XXA Unspecified injury of thorax, initial encounter: Secondary | ICD-10-CM | POA: Diagnosis not present

## 2017-04-16 DIAGNOSIS — Z9849 Cataract extraction status, unspecified eye: Secondary | ICD-10-CM

## 2017-04-16 DIAGNOSIS — Z419 Encounter for procedure for purposes other than remedying health state, unspecified: Secondary | ICD-10-CM

## 2017-04-16 DIAGNOSIS — K7460 Unspecified cirrhosis of liver: Secondary | ICD-10-CM

## 2017-04-16 DIAGNOSIS — E871 Hypo-osmolality and hyponatremia: Secondary | ICD-10-CM | POA: Diagnosis present

## 2017-04-16 DIAGNOSIS — J449 Chronic obstructive pulmonary disease, unspecified: Secondary | ICD-10-CM | POA: Diagnosis not present

## 2017-04-16 DIAGNOSIS — R911 Solitary pulmonary nodule: Secondary | ICD-10-CM | POA: Diagnosis not present

## 2017-04-16 DIAGNOSIS — Z9889 Other specified postprocedural states: Secondary | ICD-10-CM

## 2017-04-16 DIAGNOSIS — Z8673 Personal history of transient ischemic attack (TIA), and cerebral infarction without residual deficits: Secondary | ICD-10-CM | POA: Diagnosis not present

## 2017-04-16 DIAGNOSIS — T148XXA Other injury of unspecified body region, initial encounter: Secondary | ICD-10-CM

## 2017-04-16 DIAGNOSIS — S82401D Unspecified fracture of shaft of right fibula, subsequent encounter for closed fracture with routine healing: Secondary | ICD-10-CM | POA: Diagnosis not present

## 2017-04-16 DIAGNOSIS — G8929 Other chronic pain: Secondary | ICD-10-CM | POA: Diagnosis not present

## 2017-04-16 DIAGNOSIS — S82231A Displaced oblique fracture of shaft of right tibia, initial encounter for closed fracture: Secondary | ICD-10-CM | POA: Diagnosis not present

## 2017-04-16 DIAGNOSIS — S82141A Displaced bicondylar fracture of right tibia, initial encounter for closed fracture: Secondary | ICD-10-CM | POA: Diagnosis not present

## 2017-04-16 DIAGNOSIS — S82291A Other fracture of shaft of right tibia, initial encounter for closed fracture: Secondary | ICD-10-CM | POA: Diagnosis not present

## 2017-04-16 DIAGNOSIS — S0990XA Unspecified injury of head, initial encounter: Secondary | ICD-10-CM | POA: Diagnosis not present

## 2017-04-16 DIAGNOSIS — Z4789 Encounter for other orthopedic aftercare: Secondary | ICD-10-CM | POA: Diagnosis not present

## 2017-04-16 DIAGNOSIS — F17210 Nicotine dependence, cigarettes, uncomplicated: Secondary | ICD-10-CM | POA: Diagnosis present

## 2017-04-16 DIAGNOSIS — S82191A Other fracture of upper end of right tibia, initial encounter for closed fracture: Secondary | ICD-10-CM | POA: Diagnosis not present

## 2017-04-16 DIAGNOSIS — S199XXA Unspecified injury of neck, initial encounter: Secondary | ICD-10-CM | POA: Diagnosis not present

## 2017-04-16 DIAGNOSIS — S6992XA Unspecified injury of left wrist, hand and finger(s), initial encounter: Secondary | ICD-10-CM | POA: Diagnosis not present

## 2017-04-16 DIAGNOSIS — T07XXXA Unspecified multiple injuries, initial encounter: Secondary | ICD-10-CM

## 2017-04-16 DIAGNOSIS — D509 Iron deficiency anemia, unspecified: Secondary | ICD-10-CM | POA: Diagnosis present

## 2017-04-16 DIAGNOSIS — S82831A Other fracture of upper and lower end of right fibula, initial encounter for closed fracture: Secondary | ICD-10-CM | POA: Diagnosis not present

## 2017-04-16 DIAGNOSIS — K700 Alcoholic fatty liver: Secondary | ICD-10-CM

## 2017-04-16 DIAGNOSIS — F1010 Alcohol abuse, uncomplicated: Secondary | ICD-10-CM | POA: Diagnosis present

## 2017-04-16 DIAGNOSIS — Z79899 Other long term (current) drug therapy: Secondary | ICD-10-CM

## 2017-04-16 DIAGNOSIS — S82121A Displaced fracture of lateral condyle of right tibia, initial encounter for closed fracture: Secondary | ICD-10-CM | POA: Diagnosis not present

## 2017-04-16 DIAGNOSIS — Z961 Presence of intraocular lens: Secondary | ICD-10-CM | POA: Diagnosis present

## 2017-04-16 DIAGNOSIS — S82201A Unspecified fracture of shaft of right tibia, initial encounter for closed fracture: Secondary | ICD-10-CM

## 2017-04-16 DIAGNOSIS — M6281 Muscle weakness (generalized): Secondary | ICD-10-CM | POA: Diagnosis not present

## 2017-04-16 DIAGNOSIS — E785 Hyperlipidemia, unspecified: Secondary | ICD-10-CM | POA: Diagnosis present

## 2017-04-16 DIAGNOSIS — E119 Type 2 diabetes mellitus without complications: Secondary | ICD-10-CM | POA: Diagnosis not present

## 2017-04-16 DIAGNOSIS — Z7951 Long term (current) use of inhaled steroids: Secondary | ICD-10-CM | POA: Diagnosis not present

## 2017-04-16 DIAGNOSIS — Y903 Blood alcohol level of 60-79 mg/100 ml: Secondary | ICD-10-CM | POA: Diagnosis present

## 2017-04-16 DIAGNOSIS — S82209A Unspecified fracture of shaft of unspecified tibia, initial encounter for closed fracture: Secondary | ICD-10-CM | POA: Diagnosis present

## 2017-04-16 DIAGNOSIS — S3993XA Unspecified injury of pelvis, initial encounter: Secondary | ICD-10-CM | POA: Diagnosis not present

## 2017-04-16 DIAGNOSIS — S3991XA Unspecified injury of abdomen, initial encounter: Secondary | ICD-10-CM | POA: Diagnosis not present

## 2017-04-16 DIAGNOSIS — S82201D Unspecified fracture of shaft of right tibia, subsequent encounter for closed fracture with routine healing: Secondary | ICD-10-CM | POA: Diagnosis not present

## 2017-04-16 DIAGNOSIS — Y92410 Unspecified street and highway as the place of occurrence of the external cause: Secondary | ICD-10-CM | POA: Diagnosis not present

## 2017-04-16 DIAGNOSIS — J9811 Atelectasis: Secondary | ICD-10-CM | POA: Diagnosis not present

## 2017-04-16 DIAGNOSIS — I10 Essential (primary) hypertension: Secondary | ICD-10-CM | POA: Diagnosis present

## 2017-04-16 DIAGNOSIS — I700 Atherosclerosis of aorta: Secondary | ICD-10-CM | POA: Diagnosis present

## 2017-04-16 DIAGNOSIS — D649 Anemia, unspecified: Secondary | ICD-10-CM | POA: Diagnosis not present

## 2017-04-16 DIAGNOSIS — R918 Other nonspecific abnormal finding of lung field: Secondary | ICD-10-CM | POA: Diagnosis not present

## 2017-04-16 DIAGNOSIS — M25512 Pain in left shoulder: Secondary | ICD-10-CM | POA: Diagnosis present

## 2017-04-16 DIAGNOSIS — R509 Fever, unspecified: Secondary | ICD-10-CM

## 2017-04-16 DIAGNOSIS — R488 Other symbolic dysfunctions: Secondary | ICD-10-CM | POA: Diagnosis not present

## 2017-04-16 DIAGNOSIS — S82401S Unspecified fracture of shaft of right fibula, sequela: Secondary | ICD-10-CM | POA: Diagnosis not present

## 2017-04-16 DIAGNOSIS — R278 Other lack of coordination: Secondary | ICD-10-CM | POA: Diagnosis not present

## 2017-04-16 DIAGNOSIS — N3000 Acute cystitis without hematuria: Secondary | ICD-10-CM

## 2017-04-16 DIAGNOSIS — H409 Unspecified glaucoma: Secondary | ICD-10-CM | POA: Diagnosis present

## 2017-04-16 DIAGNOSIS — Z7289 Other problems related to lifestyle: Secondary | ICD-10-CM | POA: Diagnosis not present

## 2017-04-16 DIAGNOSIS — S82201S Unspecified fracture of shaft of right tibia, sequela: Secondary | ICD-10-CM | POA: Diagnosis not present

## 2017-04-16 DIAGNOSIS — S82101A Unspecified fracture of upper end of right tibia, initial encounter for closed fracture: Secondary | ICD-10-CM | POA: Diagnosis not present

## 2017-04-16 HISTORY — PX: TIBIA IM NAIL INSERTION: SHX2516

## 2017-04-16 HISTORY — PX: IM NAILING TIBIA: SUR734

## 2017-04-16 LAB — COMPREHENSIVE METABOLIC PANEL
ALT: 12 U/L — ABNORMAL LOW (ref 17–63)
AST: 27 U/L (ref 15–41)
Albumin: 3.6 g/dL (ref 3.5–5.0)
Alkaline Phosphatase: 61 U/L (ref 38–126)
Anion gap: 8 (ref 5–15)
BUN: 9 mg/dL (ref 6–20)
CHLORIDE: 102 mmol/L (ref 101–111)
CO2: 22 mmol/L (ref 22–32)
Calcium: 8.5 mg/dL — ABNORMAL LOW (ref 8.9–10.3)
Creatinine, Ser: 0.89 mg/dL (ref 0.61–1.24)
Glucose, Bld: 86 mg/dL (ref 65–99)
POTASSIUM: 3.9 mmol/L (ref 3.5–5.1)
SODIUM: 132 mmol/L — AB (ref 135–145)
Total Bilirubin: 0.8 mg/dL (ref 0.3–1.2)
Total Protein: 6.7 g/dL (ref 6.5–8.1)

## 2017-04-16 LAB — URINALYSIS, ROUTINE W REFLEX MICROSCOPIC
Bilirubin Urine: NEGATIVE
GLUCOSE, UA: NEGATIVE mg/dL
Hgb urine dipstick: NEGATIVE
KETONES UR: NEGATIVE mg/dL
NITRITE: POSITIVE — AB
PROTEIN: NEGATIVE mg/dL
Specific Gravity, Urine: 1.013 (ref 1.005–1.030)
pH: 6 (ref 5.0–8.0)

## 2017-04-16 LAB — CBC WITH DIFFERENTIAL/PLATELET
BASOS ABS: 0 10*3/uL (ref 0.0–0.1)
Basophils Relative: 0 %
EOS ABS: 0 10*3/uL (ref 0.0–0.7)
EOS PCT: 1 %
HCT: 35.4 % — ABNORMAL LOW (ref 39.0–52.0)
Hemoglobin: 11.7 g/dL — ABNORMAL LOW (ref 13.0–17.0)
LYMPHS ABS: 1.2 10*3/uL (ref 0.7–4.0)
LYMPHS PCT: 35 %
MCH: 24.8 pg — AB (ref 26.0–34.0)
MCHC: 33.1 g/dL (ref 30.0–36.0)
MCV: 75.2 fL — AB (ref 78.0–100.0)
MONO ABS: 0.3 10*3/uL (ref 0.1–1.0)
Monocytes Relative: 9 %
Neutro Abs: 2 10*3/uL (ref 1.7–7.7)
Neutrophils Relative %: 55 %
PLATELETS: 227 10*3/uL (ref 150–400)
RBC: 4.71 MIL/uL (ref 4.22–5.81)
RDW: 16.3 % — AB (ref 11.5–15.5)
WBC: 3.5 10*3/uL — ABNORMAL LOW (ref 4.0–10.5)

## 2017-04-16 LAB — CBC
HEMATOCRIT: 38.3 % — AB (ref 39.0–52.0)
HEMOGLOBIN: 12.4 g/dL — AB (ref 13.0–17.0)
MCH: 24.7 pg — ABNORMAL LOW (ref 26.0–34.0)
MCHC: 32.4 g/dL (ref 30.0–36.0)
MCV: 76.3 fL — ABNORMAL LOW (ref 78.0–100.0)
Platelets: 319 10*3/uL (ref 150–400)
RBC: 5.02 MIL/uL (ref 4.22–5.81)
RDW: 16.3 % — AB (ref 11.5–15.5)
WBC: 7.4 10*3/uL (ref 4.0–10.5)

## 2017-04-16 LAB — RAPID URINE DRUG SCREEN, HOSP PERFORMED
AMPHETAMINES: NOT DETECTED
Barbiturates: NOT DETECTED
Benzodiazepines: NOT DETECTED
Cocaine: POSITIVE — AB
Opiates: NOT DETECTED
Tetrahydrocannabinol: NOT DETECTED

## 2017-04-16 LAB — GLUCOSE, CAPILLARY
GLUCOSE-CAPILLARY: 74 mg/dL (ref 65–99)
GLUCOSE-CAPILLARY: 220 mg/dL — AB (ref 65–99)
Glucose-Capillary: 110 mg/dL — ABNORMAL HIGH (ref 65–99)
Glucose-Capillary: 129 mg/dL — ABNORMAL HIGH (ref 65–99)

## 2017-04-16 LAB — TYPE AND SCREEN
ABO/RH(D): O POS
Antibody Screen: NEGATIVE

## 2017-04-16 LAB — LIPID PANEL
CHOLESTEROL: 225 mg/dL — AB (ref 0–200)
HDL: 100 mg/dL (ref >40)
LDL Cholesterol: 111 mg/dL — ABNORMAL HIGH (ref 0–99)
Total CHOL/HDL Ratio: 2.3 RATIO
Triglycerides: 71 mg/dL (ref <150)
VLDL: 14 mg/dL (ref 0–40)

## 2017-04-16 LAB — CREATININE, SERUM: Creatinine, Ser: 1.07 mg/dL (ref 0.61–1.24)

## 2017-04-16 LAB — HEMOGLOBIN A1C
Hgb A1c MFr Bld: 5.7 % — ABNORMAL HIGH (ref 4.8–5.6)
MEAN PLASMA GLUCOSE: 116.89 mg/dL

## 2017-04-16 LAB — ETHANOL: Alcohol, Ethyl (B): 65 mg/dL — ABNORMAL HIGH (ref <10)

## 2017-04-16 SURGERY — INSERTION, INTRAMEDULLARY ROD, TIBIA
Anesthesia: General | Site: Leg Lower | Laterality: Right

## 2017-04-16 MED ORDER — PROPOFOL 10 MG/ML IV BOLUS
INTRAVENOUS | Status: DC | PRN
  Administered 2017-04-16: 30 mg via INTRAVENOUS
  Administered 2017-04-16: 90 mg via INTRAVENOUS

## 2017-04-16 MED ORDER — CEFAZOLIN SODIUM-DEXTROSE 2-4 GM/100ML-% IV SOLN
2.0000 g | Freq: Four times a day (QID) | INTRAVENOUS | Status: AC
  Administered 2017-04-16 – 2017-04-17 (×3): 2 g via INTRAVENOUS
  Filled 2017-04-16 (×3): qty 100

## 2017-04-16 MED ORDER — ACETAMINOPHEN 650 MG RE SUPP: 650.0000 mg | Freq: Four times a day (QID) | RECTAL | Status: DC | PRN

## 2017-04-16 MED ORDER — HEPARIN SODIUM (PORCINE) 5000 UNIT/ML IJ SOLN: 5000.0000 [IU] | Freq: Three times a day (TID) | INTRAMUSCULAR | Status: DC

## 2017-04-16 MED ORDER — CEFAZOLIN SODIUM-DEXTROSE 2-4 GM/100ML-% IV SOLN
2.0000 g | Freq: Once | INTRAVENOUS | Status: AC
  Administered 2017-04-16: 2 g via INTRAVENOUS
  Filled 2017-04-16: qty 100

## 2017-04-16 MED ORDER — MAGNESIUM CITRATE PO SOLN
1.0000 | Freq: Once | ORAL | Status: AC | PRN
  Administered 2017-04-18: 1 via ORAL
  Filled 2017-04-16: qty 296

## 2017-04-16 MED ORDER — METHOCARBAMOL 1000 MG/10ML IJ SOLN
500.0000 mg | Freq: Four times a day (QID) | INTRAVENOUS | Status: DC | PRN
  Filled 2017-04-16: qty 5

## 2017-04-16 MED ORDER — LIDOCAINE 2% (20 MG/ML) 5 ML SYRINGE
INTRAMUSCULAR | Status: DC | PRN
  Administered 2017-04-16: 60 mg via INTRAVENOUS

## 2017-04-16 MED ORDER — METOCLOPRAMIDE HCL 5 MG PO TABS: 5.0000 mg | ORAL_TABLET | Freq: Three times a day (TID) | ORAL | Status: DC | PRN

## 2017-04-16 MED ORDER — OXYCODONE HCL 5 MG PO TABS
5.0000 mg | ORAL_TABLET | ORAL | Status: DC | PRN
  Administered 2017-04-16: 10 mg via ORAL
  Administered 2017-04-17 – 2017-04-19 (×12): 15 mg via ORAL
  Administered 2017-04-20: 5 mg via ORAL
  Filled 2017-04-16 (×7): qty 3
  Filled 2017-04-16 (×2): qty 2
  Filled 2017-04-16: qty 3
  Filled 2017-04-16: qty 1
  Filled 2017-04-16 (×2): qty 3
  Filled 2017-04-16: qty 1
  Filled 2017-04-16: qty 3

## 2017-04-16 MED ORDER — POLYETHYLENE GLYCOL 3350 17 G PO PACK
17.0000 g | PACK | Freq: Every day | ORAL | Status: DC | PRN
Start: 2017-04-16 — End: 2017-04-19

## 2017-04-16 MED ORDER — FENTANYL CITRATE (PF) 100 MCG/2ML IJ SOLN
INTRAMUSCULAR | Status: DC | PRN
  Administered 2017-04-16: 50 ug via INTRAVENOUS
  Administered 2017-04-16: 25 ug via INTRAVENOUS
  Administered 2017-04-16: 50 ug via INTRAVENOUS
  Administered 2017-04-16 (×3): 25 ug via INTRAVENOUS
  Administered 2017-04-16: 50 ug via INTRAVENOUS

## 2017-04-16 MED ORDER — MORPHINE SULFATE (PF) 4 MG/ML IV SOLN
1.0000 mg | INTRAVENOUS | Status: DC | PRN
  Administered 2017-04-17: 1 mg via INTRAVENOUS
  Filled 2017-04-16: qty 1

## 2017-04-16 MED ORDER — MORPHINE SULFATE (PF) 4 MG/ML IV SOLN
4.0000 mg | Freq: Once | INTRAVENOUS | Status: AC
  Administered 2017-04-16: 4 mg via INTRAVENOUS
  Filled 2017-04-16: qty 1

## 2017-04-16 MED ORDER — HYDROMORPHONE HCL 1 MG/ML IJ SOLN
INTRAMUSCULAR | Status: DC | PRN
  Administered 2017-04-16: 0.5 mg via INTRAVENOUS

## 2017-04-16 MED ORDER — FENTANYL CITRATE (PF) 100 MCG/2ML IJ SOLN
50.0000 ug | Freq: Once | INTRAMUSCULAR | Status: AC
  Administered 2017-04-16: 50 ug via INTRAVENOUS
  Filled 2017-04-16: qty 2

## 2017-04-16 MED ORDER — MORPHINE SULFATE (PF) 4 MG/ML IV SOLN
4.0000 mg | INTRAVENOUS | Status: DC | PRN
  Administered 2017-04-17 (×2): 4 mg via INTRAVENOUS
  Filled 2017-04-16 (×2): qty 1

## 2017-04-16 MED ORDER — ONDANSETRON HCL 4 MG/2ML IJ SOLN: 4.0000 mg | Freq: Once | INTRAMUSCULAR | Status: DC | PRN

## 2017-04-16 MED ORDER — LORAZEPAM 2 MG/ML IJ SOLN
1.0000 mg | Freq: Once | INTRAMUSCULAR | Status: AC
  Administered 2017-04-16: 1 mg via INTRAVENOUS
  Filled 2017-04-16: qty 1

## 2017-04-16 MED ORDER — PHENYLEPHRINE 40 MCG/ML (10ML) SYRINGE FOR IV PUSH (FOR BLOOD PRESSURE SUPPORT)
PREFILLED_SYRINGE | INTRAVENOUS | Status: DC | PRN
  Administered 2017-04-16: 40 ug via INTRAVENOUS
  Administered 2017-04-16 (×4): 80 ug via INTRAVENOUS

## 2017-04-16 MED ORDER — MIDAZOLAM HCL 2 MG/2ML IJ SOLN
INTRAMUSCULAR | Status: AC
  Filled 2017-04-16: qty 2

## 2017-04-16 MED ORDER — DEXTROSE 5 % IV SOLN
1.0000 g | INTRAVENOUS | Status: DC
  Filled 2017-04-16: qty 10

## 2017-04-16 MED ORDER — SORBITOL 70 % SOLN: 30.0000 mL | Freq: Every day | Status: DC | PRN

## 2017-04-16 MED ORDER — ENOXAPARIN SODIUM 40 MG/0.4ML ~~LOC~~ SOLN
40.0000 mg | SUBCUTANEOUS | Status: DC
  Administered 2017-04-17 – 2017-04-20 (×4): 40 mg via SUBCUTANEOUS
  Filled 2017-04-16 (×4): qty 0.4

## 2017-04-16 MED ORDER — ROCURONIUM BROMIDE 50 MG/5ML IV SOLN
INTRAVENOUS | Status: AC
  Filled 2017-04-16: qty 1

## 2017-04-16 MED ORDER — ONDANSETRON HCL 4 MG/2ML IJ SOLN: 4.0000 mg | Freq: Four times a day (QID) | INTRAMUSCULAR | Status: DC | PRN

## 2017-04-16 MED ORDER — 0.9 % SODIUM CHLORIDE (POUR BTL) OPTIME
TOPICAL | Status: DC | PRN
  Administered 2017-04-16: 1000 mL

## 2017-04-16 MED ORDER — ADULT MULTIVITAMIN W/MINERALS CH
1.0000 | ORAL_TABLET | Freq: Every day | ORAL | Status: DC
  Administered 2017-04-17 – 2017-04-21 (×6): 1 via ORAL
  Filled 2017-04-16 (×6): qty 1

## 2017-04-16 MED ORDER — DEXTROSE 5 % IV SOLN
1.0000 g | Freq: Once | INTRAVENOUS | Status: AC
  Administered 2017-04-16: 1 g via INTRAVENOUS
  Filled 2017-04-16: qty 10

## 2017-04-16 MED ORDER — DEXAMETHASONE SODIUM PHOSPHATE 10 MG/ML IJ SOLN
INTRAMUSCULAR | Status: DC | PRN
  Administered 2017-04-16: 10 mg via INTRAVENOUS

## 2017-04-16 MED ORDER — THIAMINE HCL 100 MG/ML IJ SOLN: 100.0000 mg | Freq: Every day | INTRAMUSCULAR | Status: DC

## 2017-04-16 MED ORDER — FENTANYL CITRATE (PF) 250 MCG/5ML IJ SOLN
INTRAMUSCULAR | Status: AC
  Filled 2017-04-16: qty 5

## 2017-04-16 MED ORDER — LACTATED RINGERS IV SOLN
INTRAVENOUS | Status: DC
  Administered 2017-04-16 (×2): via INTRAVENOUS

## 2017-04-16 MED ORDER — SODIUM CHLORIDE 0.9 % IV SOLN
INTRAVENOUS | Status: DC
  Administered 2017-04-16: 04:00:00 via INTRAVENOUS

## 2017-04-16 MED ORDER — ACETAMINOPHEN 325 MG PO TABS: 650.0000 mg | ORAL_TABLET | Freq: Four times a day (QID) | ORAL | Status: DC | PRN

## 2017-04-16 MED ORDER — ONDANSETRON HCL 4 MG/2ML IJ SOLN
INTRAMUSCULAR | Status: AC
  Filled 2017-04-16: qty 2

## 2017-04-16 MED ORDER — PROPOFOL 10 MG/ML IV BOLUS
INTRAVENOUS | Status: AC
  Filled 2017-04-16: qty 20

## 2017-04-16 MED ORDER — INSULIN ASPART 100 UNIT/ML ~~LOC~~ SOLN
0.0000 [IU] | Freq: Three times a day (TID) | SUBCUTANEOUS | Status: DC
  Administered 2017-04-18: 1 [IU] via SUBCUTANEOUS
  Administered 2017-04-19: 3 [IU] via SUBCUTANEOUS
  Administered 2017-04-20: 1 [IU] via SUBCUTANEOUS

## 2017-04-16 MED ORDER — VITAMIN B-1 100 MG PO TABS
100.0000 mg | ORAL_TABLET | Freq: Every day | ORAL | Status: DC
  Administered 2017-04-16 – 2017-04-21 (×6): 100 mg via ORAL
  Filled 2017-04-16 (×6): qty 1

## 2017-04-16 MED ORDER — PHENYLEPHRINE HCL 10 MG/ML IJ SOLN
INTRAVENOUS | Status: DC | PRN
  Administered 2017-04-16: 50 ug/min via INTRAVENOUS

## 2017-04-16 MED ORDER — IOPAMIDOL (ISOVUE-300) INJECTION 61%
INTRAVENOUS | Status: AC
  Administered 2017-04-16: 100 mL
  Filled 2017-04-16: qty 100

## 2017-04-16 MED ORDER — HYDROMORPHONE HCL 1 MG/ML IJ SOLN
INTRAMUSCULAR | Status: AC
  Filled 2017-04-16: qty 0.5

## 2017-04-16 MED ORDER — LORAZEPAM 1 MG PO TABS: 1.0000 mg | ORAL_TABLET | Freq: Four times a day (QID) | ORAL | Status: AC | PRN

## 2017-04-16 MED ORDER — SODIUM CHLORIDE 0.9 % IV SOLN
INTRAVENOUS | Status: DC
  Administered 2017-04-16: 21:00:00 via INTRAVENOUS

## 2017-04-16 MED ORDER — NICOTINE 21 MG/24HR TD PT24
21.0000 mg | MEDICATED_PATCH | Freq: Every day | TRANSDERMAL | Status: DC
  Administered 2017-04-16 – 2017-04-21 (×6): 21 mg via TRANSDERMAL
  Filled 2017-04-16 (×7): qty 1

## 2017-04-16 MED ORDER — METOCLOPRAMIDE HCL 5 MG/ML IJ SOLN: 5.0000 mg | Freq: Three times a day (TID) | INTRAMUSCULAR | Status: DC | PRN

## 2017-04-16 MED ORDER — FOLIC ACID 1 MG PO TABS
1.0000 mg | ORAL_TABLET | Freq: Every day | ORAL | Status: DC
  Administered 2017-04-16 – 2017-04-21 (×6): 1 mg via ORAL
  Filled 2017-04-16 (×6): qty 1

## 2017-04-16 MED ORDER — ONDANSETRON HCL 4 MG PO TABS: 4.0000 mg | ORAL_TABLET | Freq: Four times a day (QID) | ORAL | Status: DC | PRN

## 2017-04-16 MED ORDER — MIDAZOLAM HCL 2 MG/2ML IJ SOLN
1.0000 mg | Freq: Once | INTRAMUSCULAR | Status: AC
  Administered 2017-04-16: 1 mg via INTRAVENOUS

## 2017-04-16 MED ORDER — LORAZEPAM 2 MG/ML IJ SOLN: 1.0000 mg | Freq: Four times a day (QID) | INTRAMUSCULAR | Status: AC | PRN

## 2017-04-16 MED ORDER — ONDANSETRON HCL 4 MG/2ML IJ SOLN
INTRAMUSCULAR | Status: DC | PRN
  Administered 2017-04-16: 4 mg via INTRAVENOUS

## 2017-04-16 MED ORDER — ACETAMINOPHEN 650 MG RE SUPP
650.0000 mg | Freq: Four times a day (QID) | RECTAL | Status: DC | PRN
Start: 2017-04-16 — End: 2017-04-21

## 2017-04-16 MED ORDER — ROCURONIUM BROMIDE 100 MG/10ML IV SOLN
INTRAVENOUS | Status: DC | PRN
  Administered 2017-04-16: 10 mg via INTRAVENOUS

## 2017-04-16 MED ORDER — FENTANYL CITRATE (PF) 100 MCG/2ML IJ SOLN: 25.0000 ug | INTRAMUSCULAR | Status: DC | PRN

## 2017-04-16 MED ORDER — ACETAMINOPHEN 325 MG PO TABS
650.0000 mg | ORAL_TABLET | Freq: Four times a day (QID) | ORAL | Status: DC | PRN
  Administered 2017-04-18 – 2017-04-19 (×2): 650 mg via ORAL
  Filled 2017-04-16 (×2): qty 2

## 2017-04-16 MED ORDER — METHOCARBAMOL 500 MG PO TABS
500.0000 mg | ORAL_TABLET | Freq: Four times a day (QID) | ORAL | Status: DC | PRN
  Administered 2017-04-16 – 2017-04-20 (×4): 500 mg via ORAL
  Filled 2017-04-16 (×4): qty 1

## 2017-04-16 MED ORDER — DEXAMETHASONE SODIUM PHOSPHATE 10 MG/ML IJ SOLN
INTRAMUSCULAR | Status: AC
  Filled 2017-04-16: qty 1

## 2017-04-16 MED ORDER — LIDOCAINE 2% (20 MG/ML) 5 ML SYRINGE
INTRAMUSCULAR | Status: AC
  Filled 2017-04-16: qty 5

## 2017-04-16 MED ORDER — MIDAZOLAM HCL 5 MG/5ML IJ SOLN
INTRAMUSCULAR | Status: DC | PRN
  Administered 2017-04-16 (×2): 1 mg via INTRAVENOUS

## 2017-04-16 MED ORDER — DIPHENHYDRAMINE HCL 12.5 MG/5ML PO ELIX: 25.0000 mg | ORAL_SOLUTION | ORAL | Status: DC | PRN

## 2017-04-16 SURGICAL SUPPLY — 61 items
BANDAGE ACE 4X5 VEL STRL LF (GAUZE/BANDAGES/DRESSINGS) ×2 IMPLANT
BANDAGE ACE 6X5 VEL STRL LF (GAUZE/BANDAGES/DRESSINGS) ×2 IMPLANT
BANDAGE ELASTIC 6 VELCRO ST LF (GAUZE/BANDAGES/DRESSINGS) ×1 IMPLANT
BANDAGE ESMARK 6X9 LF (GAUZE/BANDAGES/DRESSINGS) ×1 IMPLANT
BIT DRILL LONG 4.0 (BIT) IMPLANT
BIT DRILL SHORT 4.0 (BIT) IMPLANT
BNDG CMPR 9X6 STRL LF SNTH (GAUZE/BANDAGES/DRESSINGS) ×1
BNDG ESMARK 6X9 LF (GAUZE/BANDAGES/DRESSINGS) ×2
COVER LIGHT HANDLE  DEROYL (MISCELLANEOUS) ×2 IMPLANT
COVER MAYO STAND STRL (DRAPES) ×2 IMPLANT
COVER SURGICAL LIGHT HANDLE (MISCELLANEOUS) ×2 IMPLANT
CUFF TOURNIQUET SINGLE 34IN LL (TOURNIQUET CUFF) ×2 IMPLANT
DRAPE C-ARM 42X72 X-RAY (DRAPES) ×3 IMPLANT
DRAPE C-ARMOR (DRAPES) ×2 IMPLANT
DRAPE EXTREMITY T 121X128X90 (DRAPE) ×1 IMPLANT
DRAPE HALF SHEET 40X57 (DRAPES) ×2 IMPLANT
DRAPE IMP U-DRAPE 54X76 (DRAPES) ×2 IMPLANT
DRAPE POUCH INSTRU U-SHP 10X18 (DRAPES) ×2 IMPLANT
DRAPE U-SHAPE 47X51 STRL (DRAPES) ×2 IMPLANT
DRAPE UTILITY XL STRL (DRAPES) ×4 IMPLANT
DRILL BIT LONG 4.0 (BIT) ×2
DRILL BIT SHORT 4.0 (BIT) ×4
DRSG MEPILEX BORDER 4X4 (GAUZE/BANDAGES/DRESSINGS) ×2 IMPLANT
DRSG MEPILEX BORDER 4X8 (GAUZE/BANDAGES/DRESSINGS) ×2 IMPLANT
DRSG PAD ABDOMINAL 8X10 ST (GAUZE/BANDAGES/DRESSINGS) ×2 IMPLANT
DURAPREP 26ML APPLICATOR (WOUND CARE) ×2 IMPLANT
ELECT CAUTERY BLADE 6.4 (BLADE) ×2 IMPLANT
ELECT REM PT RETURN 9FT ADLT (ELECTROSURGICAL) ×2
ELECTRODE REM PT RTRN 9FT ADLT (ELECTROSURGICAL) ×1 IMPLANT
FACESHIELD WRAPAROUND (MASK) IMPLANT
FACESHIELD WRAPAROUND OR TEAM (MASK) IMPLANT
GAUZE SPONGE 4X4 12PLY STRL (GAUZE/BANDAGES/DRESSINGS) ×2 IMPLANT
GAUZE XEROFORM 1X8 LF (GAUZE/BANDAGES/DRESSINGS) ×3 IMPLANT
GLOVE SKINSENSE NS SZ7.5 (GLOVE) ×4
GLOVE SKINSENSE STRL SZ7.5 (GLOVE) ×4 IMPLANT
GOWN STRL REIN XL XLG (GOWN DISPOSABLE) ×2 IMPLANT
GUIDE PIN 3.2X343 (PIN) ×2
GUIDE PIN 3.2X343MM (PIN) ×4
GUIDE ROD 3.0 (MISCELLANEOUS) ×2
KIT BASIN OR (CUSTOM PROCEDURE TRAY) ×2 IMPLANT
MANIFOLD NEPTUNE II (INSTRUMENTS) ×2 IMPLANT
NAIL IM TIBIAL TRIGEN 10X33 (Nail) IMPLANT
NAIL TIBIAL 10X33 (Nail) ×2 IMPLANT
NS IRRIG 1000ML POUR BTL (IV SOLUTION) ×2 IMPLANT
PACK TOTAL JOINT (CUSTOM PROCEDURE TRAY) ×2 IMPLANT
PACK UNIVERSAL I (CUSTOM PROCEDURE TRAY) ×2 IMPLANT
PAD CAST 4YDX4 CTTN HI CHSV (CAST SUPPLIES) ×2 IMPLANT
PADDING CAST COTTON 4X4 STRL (CAST SUPPLIES) ×4
PIN GUIDE 3.2X343MM (PIN) IMPLANT
ROD GUIDE 3.0 (MISCELLANEOUS) IMPLANT
SCREW TRIGEN LOW PROF 5.0X35 (Screw) ×1 IMPLANT
SCREW TRIGEN LOW PROF 5.0X47.5 (Screw) ×2 IMPLANT
SCREW TRIGEN LOW PROF 5.0X50 (Screw) ×1 IMPLANT
SCREW TRIGEN LOW PROF 5.0X67.5 (Screw) ×1 IMPLANT
STAPLER SKIN PROX WIDE 3.9 (STAPLE) ×2 IMPLANT
SUT VIC AB 0 CT1 27 (SUTURE) ×4
SUT VIC AB 0 CT1 27XBRD ANTBC (SUTURE) ×2 IMPLANT
SUT VIC AB 2-0 CT1 27 (SUTURE) ×4
SUT VIC AB 2-0 CT1 TAPERPNT 27 (SUTURE) ×2 IMPLANT
TOWEL OR 17X26 10 PK STRL BLUE (TOWEL DISPOSABLE) ×4 IMPLANT
WATER STERILE IRR 1000ML POUR (IV SOLUTION) ×2 IMPLANT

## 2017-04-16 NOTE — OR Nursing (Signed)
Patient's wallet with money and cards given to security.  Accepted by officer Andrey Campanile.
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
## 2017-04-16 NOTE — Anesthesia Preprocedure Evaluation (Addendum)
Anesthesia Evaluation  Patient identified by MRN, date of birth, ID band Patient awake    Reviewed: Allergy & Precautions, H&P , NPO status , Patient's Chart, lab work & pertinent test results  Airway Mallampati: IV  TM Distance: >3 FB Neck ROM: Full    Dental  (+) Edentulous Upper, Edentulous Lower   Pulmonary COPD, Current Smoker,    breath sounds clear to auscultation       Cardiovascular hypertension, Pt. on medications +CHF  + Valvular Problems/Murmurs MR  Rhythm:Regular Rate:Normal  TTE 2016 - Moderate concentric LVH. Ejection fraction was in the range of 50% to 55%. Grade 1 diastolic dysfunction. Mild MR.   Neuro/Psych Anxiety Depression CVA    GI/Hepatic negative GI ROS, (+) Cirrhosis     substance abuse  alcohol use and cocaine use,   Endo/Other  diabetes  Renal/GU Renal disease  negative genitourinary   Musculoskeletal  (+) Arthritis ,   Abdominal   Peds  Hematology  (+) anemia ,   Anesthesia Other Findings   Reproductive/Obstetrics                           Anesthesia Physical  Anesthesia Plan  ASA: III  Anesthesia Plan: General   Post-op Pain Management:    Induction: Intravenous  PONV Risk Score and Plan: 3 and Ondansetron, Dexamethasone, Midazolam and Treatment may vary due to age or medical condition  Airway Management Planned: LMA  Additional Equipment: None  Intra-op Plan:   Post-operative Plan: Extubation in OR  Informed Consent: I have reviewed the patients History and Physical, chart, labs and discussed the procedure including the risks, benefits and alternatives for the proposed anesthesia with the patient or authorized representative who has indicated his/her understanding and acceptance.   Dental advisory given  Plan Discussed with: CRNA  Anesthesia Plan Comments: (Patient with positive tox screen for cocaine, states last use was 2 days ago. Does  not appear to be acutely intoxicated, will proceed with case.)       Anesthesia Quick Evaluation
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
## 2017-04-16 NOTE — Discharge Instructions (Signed)
° ° °  1. Change dressings as needed °2. May shower but keep incisions covered and dry °3. Take lovenox to prevent blood clots °4. Take stool softeners as needed °5. Take pain meds as needed ° °

## 2017-04-16 NOTE — Anesthesia Procedure Notes (Signed)
Procedure Name: LMA Insertion Date/Time: 04/16/2017 2:15 PM Performed by: Genelle Bal Pre-anesthesia Checklist: Patient identified, Emergency Drugs available, Suction available and Patient being monitored Patient Re-evaluated:Patient Re-evaluated prior to induction Oxygen Delivery Method: Circle system utilized Preoxygenation: Pre-oxygenation with 100% oxygen Induction Type: IV induction Ventilation: Mask ventilation without difficulty LMA: LMA inserted LMA Size: 4.0 Number of attempts: 1 Airway Equipment and Method: Bite block Placement Confirmation: positive ETCO2 Tube secured with: Tape Dental Injury: Teeth and Oropharynx as per pre-operative assessment

## 2017-04-16 NOTE — ED Notes (Signed)
Ortho at bedside.

## 2017-04-16 NOTE — Progress Notes (Signed)
Orthopedic Tech Progress Note Patient Details:  Peter [name] May 16, 1951 [id number]  Ortho Devices Type of Ortho Device: Post (long leg) splint, Stirrup splint Ortho Device/Splint Location: rle post long leg splint, rle long stirrup Ortho Device/Splint Interventions: Ordered, Application, Adjustment   Karolee Stamps 04/16/2017, 3:16 AM
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
## 2017-04-16 NOTE — ED Provider Notes (Signed)
Assumed care from previous ED provider. Orthopedics consulted and evaluated. Plan for OR later today. Since being in ED, he has become increasingly tachycardic and hypertensive. He is a daily alcohol user and likely starting to withdrawal. Ativan ordered. Suspect he will need medical admit.    Virgel Manifold, MD 04/16/17 [id number]

## 2017-04-16 NOTE — ED Notes (Signed)
Hospitalist bedside 

## 2017-04-16 NOTE — Progress Notes (Signed)
[facility] 74 called to Dr Dietrich Pates. No new orders, continue to monitor.

## 2017-04-16 NOTE — ED Notes (Signed)
MD speaking to pt via telephone at this time regarding blood work.

## 2017-04-16 NOTE — Transfer of Care (Signed)
Immediate Anesthesia Transfer of Care Note  Patient: Peter [name]  Procedure(s) Performed: INTRAMEDULLARY (IM) NAIL TIBIAL (Right Leg Lower)  Patient Location: PACU  Anesthesia Type:General  Level of Consciousness: awake, alert  and oriented  Airway & Oxygen Therapy: Patient Spontanous Breathing and Patient connected to face mask oxygen  Post-op Assessment: Report given to RN and Post -op Vital signs reviewed and stable  Post vital signs: Reviewed and stable  Last Vitals:  Vitals:   04/16/17 1230 04/16/17 1245  BP: (!) 162/105 (!) 159/100  Pulse: (!) 103 99  Resp: 15 14  Temp:    SpO2: 99% 100%    Last Pain:  Vitals:   04/16/17 0901  TempSrc:   PainSc: 8          Complications: No apparent anesthesia complications

## 2017-04-16 NOTE — ED Notes (Signed)
Consent for surgery obtained, in need of physician signature. Pt in position of comfort with male companion bedside and call bell in reach.

## 2017-04-16 NOTE — Op Note (Signed)
   Date of Surgery: 04/16/2017  INDICATIONS: Mr. Harmes is a 66 y.o.-year-old male with a right tibial plateau and tibial shaft fracture;  The patient did consent to the procedure after discussion of the risks and benefits.  PREOPERATIVE DIAGNOSIS:  1. Right nondisplaced lateral tibial plateau fracture 2. Right displaced tibial shaft fracture 3. Right fibular shaft fracture  POSTOPERATIVE DIAGNOSIS: Same.  PROCEDURE:  1. Intramedullary fixation of right tibial shaft fracture 2. Open treatment with internal fixation of the tibial plateau fracture 3. Closed treatment of fibular shaft fracture with manipulation  SURGEON: N. Eduard Roux, M.D.  ASSIST: April Green, RNFA.  ANESTHESIA:  general  IV FLUIDS AND URINE: See anesthesia.  ESTIMATED BLOOD LOSS: minimal mL.  IMPLANTS: [employer]hew 33 x 10 tibial nail  DRAINS: none  COMPLICATIONS: None.  DESCRIPTION OF PROCEDURE: The patient was brought to the operating room and placed supine on the operating table.  The patient had been signed prior to the procedure and this was documented. The patient had the anesthesia placed by the anesthesiologist.  A time-out was performed to confirm that this was the correct patient, site, side and location. The patient did receive antibiotics prior to the incision and was re-dosed during the procedure as needed at indicated intervals.  A tourniquet was placed.  The patient had the operative extremity prepped and draped in the standard surgical fashion.    Two stab incisions were made one on the medial aspect of the tibial plateau and one on the lateral side.  Blunt dissection was performed to accommodate the large periarticular clamp. Using fluoroscopic guidance we placed the periarticular clamp at the tibial condyles. This was then squeezed in order to compress the tibial plateau fracture together. The clamp was tightened down. We then made a separate incision Directly over the distal quadriceps tendon.  Dissection was carried down to the tendon and the tendon was split in line with the incision. Large bloody effusion was evacuated We then placed the tissue sleeve across the patellofemoral joint down onto the proximal tibial surface. A starting guide pin was placed at the appropriate start site using fluoroscopic guidance. An opening reamer was then used to gain entry into the tibial canal. X-rays were used to confirm no displacement of the tibial plateau fracture. The fracture was then pulled out into alignment and length and clamped with a large Weber clamp. The guidewire was then passed down the tibial canal across the fracture to the distal tibial physis. Sequential reaming was then performed up to 11 reamer giving adequate chatter. A 33 x 10 tibial nail was then placed over the guidewire to the appropriate depth. I then placed 3 proximal interlocking screws through the tibial nail using the jig in order to internally fixate the tibial plateau fracture. The clamp was then removed and the fracture remained reduced. We then placed 2 distal interlocking screws using the perfect circle technique. Final x-rays were taken. Patient tolerated procedure well. Sterile dressings were applied. He was taken to the PACU in stable condition. His compartments remained soft.  POSTOPERATIVE PLAN: Patient will be nonweightbearing for 6 weeks. He will need DVT prophylaxis for the first 2 weeks.  Azucena Cecil, MD Ward 4:26 PM
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
## 2017-04-16 NOTE — OR Nursing (Signed)
Found patient to be saturated with urine after receiving anesthesia.  Replaced gown and removed boxer briefs and found a wallet with in briefs.  Peter [name] has a Administrator and checkers on it.  Witnessed by Dalene Seltzer, CRNA and [facility], ST, I placed the wallet in a sealed specimen bag and I sent to security per Elige Ko, AD.

## 2017-04-16 NOTE — Progress Notes (Signed)
Orthopedic Tech Progress Note Patient Details:  Peter [name] 08/12/1950 [id number]  Ortho Devices Type of Ortho Device: CAM walker Ortho Device/Splint Location: rle post long leg splint, rle long stirrup Ortho Device/Splint Interventions: Criss Alvine 04/16/2017, 4:56 PM

## 2017-04-16 NOTE — Anesthesia Postprocedure Evaluation (Signed)
Anesthesia Post Note  Patient: Peter [name]  Procedure(s) Performed: INTRAMEDULLARY (IM) NAIL TIBIAL (Right Leg Lower)     Patient location during evaluation: PACU Anesthesia Type: General Level of consciousness: awake and alert Pain management: pain level controlled Vital Signs Assessment: post-procedure vital signs reviewed and stable Respiratory status: spontaneous breathing, nonlabored ventilation and respiratory function stable Cardiovascular status: blood pressure returned to baseline and stable Postop Assessment: no apparent nausea or vomiting Anesthetic complications: no    Last Vitals:  Vitals:   04/16/17 1710 04/16/17 1724  BP: (!) 168/94 (!) 161/91  Pulse: 96 93  Resp: 11 10  Temp:  36.8 C  SpO2: 97% 97%    Last Pain:  Vitals:   04/16/17 1639  TempSrc:   PainSc: 0-No pain                 Audry Pili

## 2017-04-17 ENCOUNTER — Encounter (HOSPITAL_COMMUNITY): Payer: Self-pay | Admitting: General Practice

## 2017-04-17 DIAGNOSIS — S82231A Displaced oblique fracture of shaft of right tibia, initial encounter for closed fracture: Secondary | ICD-10-CM

## 2017-04-17 DIAGNOSIS — S82121A Displaced fracture of lateral condyle of right tibia, initial encounter for closed fracture: Secondary | ICD-10-CM

## 2017-04-17 LAB — BASIC METABOLIC PANEL
Anion gap: 8 (ref 5–15)
BUN: 9 mg/dL (ref 6–20)
CHLORIDE: 103 mmol/L (ref 101–111)
CO2: 22 mmol/L (ref 22–32)
Calcium: 8.3 mg/dL — ABNORMAL LOW (ref 8.9–10.3)
Creatinine, Ser: 0.9 mg/dL (ref 0.61–1.24)
GFR calc Af Amer: >60 mL/min (ref >60)
GFR calc non Af Amer: >60 mL/min (ref >60)
GLUCOSE: 99 mg/dL (ref 65–99)
POTASSIUM: 3.9 mmol/L (ref 3.5–5.1)
Sodium: 133 mmol/L — ABNORMAL LOW (ref 135–145)

## 2017-04-17 LAB — IRON AND TIBC
Iron: 19 ug/dL — ABNORMAL LOW (ref 45–182)
Saturation Ratios: 6 % — ABNORMAL LOW (ref 17.9–39.5)
TIBC: 314 ug/dL (ref 250–450)
UIBC: 295 ug/dL

## 2017-04-17 LAB — URINE CULTURE

## 2017-04-17 LAB — GLUCOSE, CAPILLARY
GLUCOSE-CAPILLARY: 100 mg/dL — AB (ref 65–99)
GLUCOSE-CAPILLARY: 107 mg/dL — AB (ref 65–99)
Glucose-Capillary: 108 mg/dL — ABNORMAL HIGH (ref 65–99)
Glucose-Capillary: 111 mg/dL — ABNORMAL HIGH (ref 65–99)

## 2017-04-17 LAB — CBC
HCT: 32.1 % — ABNORMAL LOW (ref 39.0–52.0)
HEMOGLOBIN: 10.2 g/dL — AB (ref 13.0–17.0)
MCH: 24 pg — AB (ref 26.0–34.0)
MCHC: 31.8 g/dL (ref 30.0–36.0)
MCV: 75.5 fL — AB (ref 78.0–100.0)
PLATELETS: 275 10*3/uL (ref 150–400)
RBC: 4.25 MIL/uL (ref 4.22–5.81)
RDW: 16.2 % — ABNORMAL HIGH (ref 11.5–15.5)
WBC: 5.9 10*3/uL (ref 4.0–10.5)

## 2017-04-17 LAB — HIV ANTIBODY (ROUTINE TESTING W REFLEX): HIV Screen 4th Generation wRfx: NONREACTIVE

## 2017-04-17 LAB — FERRITIN: FERRITIN: 82 ng/mL (ref 24–336)
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
## 2017-04-17 NOTE — Progress Notes (Signed)
OT Cancellation Note  Patient Details Name: JAYKE CAUL MRN: [id number] DOB: [birth year]   Cancelled Treatment:    Reason Eval/Treat Not Completed: Pain limiting ability to participate. Pt reporting 10/10 pain in RLE. Nursing aware and about to give pain meds. Will reattempt as able.   Hortencia Pilar 04/17/2017, 11:05 AM

## 2017-04-17 NOTE — Progress Notes (Signed)
   Subjective:  Patient reports pain as moderate.    Objective:   VITALS:   Vitals:   04/16/17 1724 04/16/17 1740 04/16/17 2052 04/17/17 0130  BP: (!) 161/91 (!) 160/85 (!) 156/92 123/69  Pulse: 93 97 89 99  Resp: 10 18 18 17   Temp: 98.3 F (36.8 C) 98.2 F (36.8 C) 98 F (36.7 C) 98.6 F (37 C)  TempSrc:  Oral Oral Oral  SpO2: 97% 94% 95% 100%  Weight:      Height:        Neurologically intact Neurovascular intact Sensation intact distally Intact pulses distally Dorsiflexion/Plantar flexion intact Incision: dressing C/D/I and no drainage No cellulitis present Compartment soft   Lab Results  Component Value Date   WBC 5.9 04/17/2017   HGB 10.2 (L) 04/17/2017   HCT 32.1 (L) 04/17/2017   MCV 75.5 (L) 04/17/2017   PLT 275 04/17/2017     Assessment/Plan:  1 Day Post-Op   - Expected postop acute blood loss anemia - will monitor for symptoms - Up with PT/OT - DVT ppx - SCDs, ambulation, aspirin - NWB operative extremity x 6 weeks - Pain control - stable from orthopedic stand point - f/u 2 weeks  Eduard Roux 04/17/2017, 7:39 AM [phone]

## 2017-04-18 ENCOUNTER — Inpatient Hospital Stay (HOSPITAL_COMMUNITY): Payer: Medicare Other

## 2017-04-18 DIAGNOSIS — S92351A Displaced fracture of fifth metatarsal bone, right foot, initial encounter for closed fracture: Secondary | ICD-10-CM

## 2017-04-18 DIAGNOSIS — S82201A Unspecified fracture of shaft of right tibia, initial encounter for closed fracture: Secondary | ICD-10-CM

## 2017-04-18 LAB — CBC
HCT: 31 % — ABNORMAL LOW (ref 39.0–52.0)
Hemoglobin: 10 g/dL — ABNORMAL LOW (ref 13.0–17.0)
MCH: 24.4 pg — ABNORMAL LOW (ref 26.0–34.0)
MCHC: 32.3 g/dL (ref 30.0–36.0)
MCV: 75.6 fL — AB (ref 78.0–100.0)
PLATELETS: 263 10*3/uL (ref 150–400)
RBC: 4.1 MIL/uL — AB (ref 4.22–5.81)
RDW: 16 % — ABNORMAL HIGH (ref 11.5–15.5)
WBC: 6.9 10*3/uL (ref 4.0–10.5)

## 2017-04-18 LAB — URINALYSIS, ROUTINE W REFLEX MICROSCOPIC
Bacteria, UA: NONE SEEN
Bilirubin Urine: NEGATIVE
GLUCOSE, UA: NEGATIVE mg/dL
Hgb urine dipstick: NEGATIVE
KETONES UR: NEGATIVE mg/dL
NITRITE: NEGATIVE
PH: 7 (ref 5.0–8.0)
Protein, ur: NEGATIVE mg/dL
SPECIFIC GRAVITY, URINE: 1.019 (ref 1.005–1.030)

## 2017-04-18 LAB — BASIC METABOLIC PANEL
Anion gap: 8 (ref 5–15)
BUN: 8 mg/dL (ref 6–20)
CHLORIDE: 100 mmol/L — AB (ref 101–111)
CO2: 25 mmol/L (ref 22–32)
Calcium: 8.7 mg/dL — ABNORMAL LOW (ref 8.9–10.3)
Creatinine, Ser: 0.84 mg/dL (ref 0.61–1.24)
GFR calc Af Amer: >60 mL/min (ref >60)
GFR calc non Af Amer: >60 mL/min (ref >60)
Glucose, Bld: 96 mg/dL (ref 65–99)
POTASSIUM: 3.8 mmol/L (ref 3.5–5.1)
SODIUM: 133 mmol/L — AB (ref 135–145)

## 2017-04-18 LAB — GLUCOSE, CAPILLARY
GLUCOSE-CAPILLARY: 101 mg/dL — AB (ref 65–99)
GLUCOSE-CAPILLARY: 132 mg/dL — AB (ref 65–99)
Glucose-Capillary: 116 mg/dL — ABNORMAL HIGH (ref 65–99)
Glucose-Capillary: 115 mg/dL — ABNORMAL HIGH (ref 65–99)

## 2017-04-18 MED ORDER — FERROUS SULFATE 325 (65 FE) MG PO TABS
325.0000 mg | ORAL_TABLET | Freq: Every day | ORAL | Status: DC
  Administered 2017-04-19 – 2017-04-21 (×3): 325 mg via ORAL
  Filled 2017-04-18 (×3): qty 1

## 2017-04-18 MED ORDER — CAPSAICIN 0.025 % EX CREA
TOPICAL_CREAM | Freq: Two times a day (BID) | CUTANEOUS | Status: DC
  Administered 2017-04-18 – 2017-04-21 (×6): via TOPICAL
  Filled 2017-04-18 (×2): qty 60

## 2017-04-18 MED ORDER — ATORVASTATIN CALCIUM 40 MG PO TABS
40.0000 mg | ORAL_TABLET | Freq: Every day | ORAL | Status: DC
  Administered 2017-04-18 – 2017-04-20 (×3): 40 mg via ORAL
  Filled 2017-04-18 (×3): qty 1
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
## 2017-04-19 DIAGNOSIS — R5082 Postprocedural fever: Secondary | ICD-10-CM | POA: Diagnosis not present

## 2017-04-19 LAB — CBC
HEMATOCRIT: 28.3 % — AB (ref 39.0–52.0)
HEMOGLOBIN: 9.2 g/dL — AB (ref 13.0–17.0)
MCH: 24.8 pg — AB (ref 26.0–34.0)
MCHC: 32.5 g/dL (ref 30.0–36.0)
MCV: 76.3 fL — AB (ref 78.0–100.0)
Platelets: 282 10*3/uL (ref 150–400)
RBC: 3.71 MIL/uL — AB (ref 4.22–5.81)
RDW: 15.9 % — ABNORMAL HIGH (ref 11.5–15.5)
WBC: 5.2 10*3/uL (ref 4.0–10.5)

## 2017-04-19 LAB — BASIC METABOLIC PANEL
ANION GAP: 8 (ref 5–15)
BUN: 11 mg/dL (ref 6–20)
CHLORIDE: 98 mmol/L — AB (ref 101–111)
CO2: 27 mmol/L (ref 22–32)
Calcium: 8.5 mg/dL — ABNORMAL LOW (ref 8.9–10.3)
Creatinine, Ser: 1.01 mg/dL (ref 0.61–1.24)
GFR calc non Af Amer: >60 mL/min (ref >60)
Glucose, Bld: 100 mg/dL — ABNORMAL HIGH (ref 65–99)
POTASSIUM: 3.8 mmol/L (ref 3.5–5.1)
Sodium: 133 mmol/L — ABNORMAL LOW (ref 135–145)

## 2017-04-19 LAB — GLUCOSE, CAPILLARY
GLUCOSE-CAPILLARY: 232 mg/dL — AB (ref 65–99)
GLUCOSE-CAPILLARY: 106 mg/dL — AB (ref 65–99)
Glucose-Capillary: 100 mg/dL — ABNORMAL HIGH (ref 65–99)
Glucose-Capillary: 104 mg/dL — ABNORMAL HIGH (ref 65–99)

## 2017-04-19 MED ORDER — POLYETHYLENE GLYCOL 3350 17 G PO PACK
17.0000 g | PACK | Freq: Every day | ORAL | Status: DC
  Administered 2017-04-20 – 2017-04-21 (×2): 17 g via ORAL
  Filled 2017-04-19 (×2): qty 1

## 2017-04-19 NOTE — Progress Notes (Signed)
According to a lab "pop-up" this patient is to be on Contact Isolation with each admission due to [place].
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
## 2017-04-20 DIAGNOSIS — S92351A Displaced fracture of fifth metatarsal bone, right foot, initial encounter for closed fracture: Secondary | ICD-10-CM | POA: Diagnosis present

## 2017-04-20 DIAGNOSIS — Z7289 Other problems related to lifestyle: Secondary | ICD-10-CM

## 2017-04-20 DIAGNOSIS — S82201A Unspecified fracture of shaft of right tibia, initial encounter for closed fracture: Secondary | ICD-10-CM | POA: Diagnosis present

## 2017-04-20 DIAGNOSIS — S82401A Unspecified fracture of shaft of right fibula, initial encounter for closed fracture: Secondary | ICD-10-CM

## 2017-04-20 LAB — BASIC METABOLIC PANEL
Anion gap: 11 (ref 5–15)
BUN: 11 mg/dL (ref 6–20)
CALCIUM: 8.6 mg/dL — AB (ref 8.9–10.3)
CO2: 23 mmol/L (ref 22–32)
CREATININE: 0.87 mg/dL (ref 0.61–1.24)
Chloride: 98 mmol/L — ABNORMAL LOW (ref 101–111)
Glucose, Bld: 95 mg/dL (ref 65–99)
Potassium: 4.1 mmol/L (ref 3.5–5.1)
SODIUM: 132 mmol/L — AB (ref 135–145)

## 2017-04-20 LAB — RAPID URINE DRUG SCREEN, HOSP PERFORMED
AMPHETAMINES: NOT DETECTED
BARBITURATES: NOT DETECTED
Benzodiazepines: NOT DETECTED
Cocaine: NOT DETECTED
Opiates: NOT DETECTED
Tetrahydrocannabinol: NOT DETECTED

## 2017-04-20 LAB — CBC
HCT: 28.2 % — ABNORMAL LOW (ref 39.0–52.0)
Hemoglobin: 9.1 g/dL — ABNORMAL LOW (ref 13.0–17.0)
MCH: 24.5 pg — ABNORMAL LOW (ref 26.0–34.0)
MCHC: 32.3 g/dL (ref 30.0–36.0)
MCV: 75.8 fL — ABNORMAL LOW (ref 78.0–100.0)
PLATELETS: 294 10*3/uL (ref 150–400)
RBC: 3.72 MIL/uL — AB (ref 4.22–5.81)
RDW: 15.6 % — ABNORMAL HIGH (ref 11.5–15.5)
WBC: 5 10*3/uL (ref 4.0–10.5)

## 2017-04-20 LAB — GLUCOSE, CAPILLARY
GLUCOSE-CAPILLARY: 113 mg/dL — AB (ref 65–99)
GLUCOSE-CAPILLARY: 101 mg/dL — AB (ref 65–99)
GLUCOSE-CAPILLARY: 105 mg/dL — AB (ref 65–99)
Glucose-Capillary: 124 mg/dL — ABNORMAL HIGH (ref 65–99)

## 2017-04-20 MED ORDER — FERROUS SULFATE 325 (65 FE) MG PO TABS: 325.0000 mg | ORAL_TABLET | Freq: Every day | ORAL | 0 refills | Status: DC

## 2017-04-20 MED ORDER — FOLIC ACID 1 MG PO TABS: 1.0000 mg | ORAL_TABLET | Freq: Every day | ORAL | 0 refills | Status: DC

## 2017-04-20 MED ORDER — ATORVASTATIN CALCIUM 40 MG PO TABS: 40.0000 mg | ORAL_TABLET | Freq: Every day | ORAL | 0 refills | Status: DC

## 2017-04-20 MED ORDER — CAPSAICIN 0.025 % EX CREA: TOPICAL_CREAM | Freq: Two times a day (BID) | CUTANEOUS | 0 refills | Status: DC

## 2017-04-20 MED ORDER — METHOCARBAMOL 500 MG PO TABS: 500.0000 mg | ORAL_TABLET | Freq: Four times a day (QID) | ORAL | 0 refills | Status: DC | PRN

## 2017-04-20 MED ORDER — THIAMINE HCL 100 MG PO TABS: 100.0000 mg | ORAL_TABLET | Freq: Every day | ORAL | 0 refills | Status: DC

## 2017-04-20 MED ORDER — OXYCODONE HCL 5 MG PO TABS: 5.0000 mg | ORAL_TABLET | ORAL | 0 refills | Status: DC | PRN

## 2017-04-20 MED ORDER — NICOTINE 21 MG/24HR TD PT24: 21.0000 mg | MEDICATED_PATCH | Freq: Every day | TRANSDERMAL | 0 refills | Status: DC

## 2017-04-20 MED ORDER — APIXABAN 2.5 MG PO TABS: 2.5000 mg | ORAL_TABLET | Freq: Two times a day (BID) | ORAL | 0 refills | Status: DC

## 2017-04-20 NOTE — Clinical Social Work Note (Signed)
CSW contacted [facility] to advise pt is ready to DC to facility and DC Summary is on the hub, CSW spoke to Director Santiago Glad who reported the facility is unsure if they can accept pt now. Apparently admission rep [name] did not see pt had active crack/cocaine use. Santiago Glad is unclear if facility will accept pt at this time and will contact SW in the AM to discuss further. BSRN updated.  Following for DC planning.  Artyom Stencel B. Joline Maxcy Clinical Social Work Dept Weekend Social Worker [phone] 6:09 PM
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
## 2017-04-20 NOTE — Social Work (Signed)
CSW met with patient at bedside to discuss bed offers.   Pt selected [facility]. CSW will f/u for discharge.  Elissa Hefty, LCSW Clinical Social Worker [phone]
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
## 2017-04-21 DIAGNOSIS — R278 Other lack of coordination: Secondary | ICD-10-CM | POA: Diagnosis not present

## 2017-04-21 DIAGNOSIS — E119 Type 2 diabetes mellitus without complications: Secondary | ICD-10-CM | POA: Diagnosis not present

## 2017-04-21 DIAGNOSIS — R488 Other symbolic dysfunctions: Secondary | ICD-10-CM | POA: Diagnosis not present

## 2017-04-21 DIAGNOSIS — S82201S Unspecified fracture of shaft of right tibia, sequela: Secondary | ICD-10-CM | POA: Diagnosis not present

## 2017-04-21 DIAGNOSIS — S82401A Unspecified fracture of shaft of right fibula, initial encounter for closed fracture: Secondary | ICD-10-CM | POA: Diagnosis not present

## 2017-04-21 DIAGNOSIS — R911 Solitary pulmonary nodule: Secondary | ICD-10-CM | POA: Diagnosis not present

## 2017-04-21 DIAGNOSIS — D509 Iron deficiency anemia, unspecified: Secondary | ICD-10-CM

## 2017-04-21 DIAGNOSIS — I700 Atherosclerosis of aorta: Secondary | ICD-10-CM | POA: Diagnosis present

## 2017-04-21 DIAGNOSIS — S82231A Displaced oblique fracture of shaft of right tibia, initial encounter for closed fracture: Secondary | ICD-10-CM | POA: Diagnosis not present

## 2017-04-21 DIAGNOSIS — S82201A Unspecified fracture of shaft of right tibia, initial encounter for closed fracture: Secondary | ICD-10-CM | POA: Diagnosis not present

## 2017-04-21 DIAGNOSIS — S82401D Unspecified fracture of shaft of right fibula, subsequent encounter for closed fracture with routine healing: Secondary | ICD-10-CM | POA: Diagnosis not present

## 2017-04-21 DIAGNOSIS — S82209A Unspecified fracture of shaft of unspecified tibia, initial encounter for closed fracture: Secondary | ICD-10-CM | POA: Diagnosis not present

## 2017-04-21 DIAGNOSIS — Z114 Encounter for screening for human immunodeficiency virus [HIV]: Secondary | ICD-10-CM | POA: Diagnosis not present

## 2017-04-21 DIAGNOSIS — S82201D Unspecified fracture of shaft of right tibia, subsequent encounter for closed fracture with routine healing: Secondary | ICD-10-CM | POA: Diagnosis not present

## 2017-04-21 DIAGNOSIS — S82401S Unspecified fracture of shaft of right fibula, sequela: Secondary | ICD-10-CM | POA: Diagnosis not present

## 2017-04-21 DIAGNOSIS — Z7409 Other reduced mobility: Secondary | ICD-10-CM | POA: Diagnosis not present

## 2017-04-21 DIAGNOSIS — M6281 Muscle weakness (generalized): Secondary | ICD-10-CM | POA: Diagnosis not present

## 2017-04-21 DIAGNOSIS — Z4789 Encounter for other orthopedic aftercare: Secondary | ICD-10-CM | POA: Diagnosis not present

## 2017-04-21 DIAGNOSIS — S82143A Displaced bicondylar fracture of unspecified tibia, initial encounter for closed fracture: Secondary | ICD-10-CM | POA: Diagnosis not present

## 2017-04-21 LAB — CBC
HEMATOCRIT: 29.7 % — AB (ref 39.0–52.0)
HEMOGLOBIN: 9.6 g/dL — AB (ref 13.0–17.0)
MCH: 24.2 pg — ABNORMAL LOW (ref 26.0–34.0)
MCHC: 32.3 g/dL (ref 30.0–36.0)
MCV: 75 fL — ABNORMAL LOW (ref 78.0–100.0)
Platelets: 358 10*3/uL (ref 150–400)
RBC: 3.96 MIL/uL — ABNORMAL LOW (ref 4.22–5.81)
RDW: 15.2 % (ref 11.5–15.5)
WBC: 4.1 10*3/uL (ref 4.0–10.5)

## 2017-04-21 LAB — BASIC METABOLIC PANEL
ANION GAP: 9 (ref 5–15)
BUN: 14 mg/dL (ref 6–20)
CALCIUM: 9.2 mg/dL (ref 8.9–10.3)
CHLORIDE: 97 mmol/L — AB (ref 101–111)
CO2: 25 mmol/L (ref 22–32)
Creatinine, Ser: 0.9 mg/dL (ref 0.61–1.24)
GFR calc non Af Amer: >60 mL/min (ref >60)
Glucose, Bld: 100 mg/dL — ABNORMAL HIGH (ref 65–99)
Potassium: 4.2 mmol/L (ref 3.5–5.1)
Sodium: 131 mmol/L — ABNORMAL LOW (ref 135–145)

## 2017-04-21 LAB — GLUCOSE, CAPILLARY
GLUCOSE-CAPILLARY: 117 mg/dL — AB (ref 65–99)
Glucose-Capillary: 116 mg/dL — ABNORMAL HIGH (ref 65–99)

## 2017-04-21 MED ORDER — CAPSAICIN 0.025 % EX CREA: TOPICAL_CREAM | Freq: Two times a day (BID) | CUTANEOUS | 0 refills | Status: DC

## 2017-04-21 MED ORDER — ATORVASTATIN CALCIUM 40 MG PO TABS: 40.0000 mg | ORAL_TABLET | Freq: Every day | ORAL | 0 refills | Status: DC

## 2017-04-21 MED ORDER — FERROUS SULFATE 325 (65 FE) MG PO TABS: 325.0000 mg | ORAL_TABLET | Freq: Every day | ORAL | 0 refills | Status: DC

## 2017-04-21 MED ORDER — OXYCODONE HCL 5 MG PO TABS: 5.0000 mg | ORAL_TABLET | Freq: Four times a day (QID) | ORAL | 0 refills | Status: DC | PRN

## 2017-04-21 MED ORDER — APIXABAN 2.5 MG PO TABS
2.5000 mg | ORAL_TABLET | Freq: Two times a day (BID) | ORAL | 0 refills | Status: DC
Start: 2017-04-21 — End: 2019-03-22

## 2017-04-21 MED ORDER — APIXABAN 2.5 MG PO TABS
2.5000 mg | ORAL_TABLET | Freq: Two times a day (BID) | ORAL | 0 refills | Status: DC
Start: 2017-04-21 — End: 2017-04-21
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
## 2017-04-21 NOTE — Clinical Social Work Placement (Signed)
   CLINICAL SOCIAL WORK PLACEMENT  NOTE  Date:  04/21/2017  Patient Details  Name: Peter [name] MRN: [id number] Date of Birth: 1950-09-30  Clinical Social Work is seeking post-discharge placement for this patient at the [place] level of care (*CSW will initial, date and re-position this form in  chart as items are completed):  Yes   Patient/family provided with [facility] Work Department's list of facilities offering this level of care within the geographic area requested by the patient (or if unable, by the patient's family).  Yes   Patient/family informed of their freedom to choose among providers that offer the needed level of care, that participate in Medicare, Medicaid or managed care program needed by the patient, have an available bed and are willing to accept the patient.  Yes   Patient/family informed of [facility]'s ownership interest in [facility] and [facility], as well as of the fact that they are under no obligation to receive care at these facilities.  PASRR submitted to EDS on       PASRR number received on       Existing PASRR number confirmed on 04/21/17     FL2 transmitted to all facilities in geographic area requested by pt/family on       FL2 transmitted to all facilities within larger geographic area on 04/14/17     Patient informed that his/her managed care company has contracts with or will negotiate with certain facilities, including the following:        Yes   Patient/family informed of bed offers received.  Patient chooses bed at [facility]     Physician recommends and patient chooses bed at      Patient to be transferred to [facility] on 04/21/17.  Patient to be transferred to facility by [facility]     Patient family notified on 04/21/17 of transfer.  Name of family member notified:  patient responsible for self     PHYSICIAN Please prepare prescriptions     Additional Comment:     _______________________________________________ Normajean Baxter, LCSW 04/21/2017, 2:30 PM
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
## 2017-04-21 NOTE — Social Work (Signed)
Clinical Social Worker facilitated patient discharge including contacting patient family and facility to confirm patient discharge plans.  Clinical information faxed to facility and family agreeable with plan.    CSW arranged ambulance transport via [facility] to [facility].    RN to call [phone] to give report prior to discharge.  Clinical Social Worker will sign off for now as social work intervention is no longer needed. Please consult us again if new need arises.  Patrizia Paule, LCSW Clinical Social Worker [phone]      
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
## 2017-04-21 NOTE — Progress Notes (Signed)
Internal Medicine Attending:   I saw and examined the patient. I reviewed the resident's note and I agree with the resident's findings and plan as documented in the resident's note with the following additions/corrections. Low grade fever, no evidence of pulmonary infiltrate, does have atelectasis., would repeat U/A ETOH use disorder: out of withdrawal window, may d/c CIWA monitoring Right upper lobe lung nodule: needs PET/CT as outpatient for follow up Iron deficiency anemia: continue iron supplementation, would needs workup for chronic blood loss as outpatient.

## 2017-04-21 NOTE — Social Work (Signed)
CSW f/u with [facility] and they will rescind the bed offer for patient.  CSW f/u with [facility]. All the other SNf's declined: [facility], [place], [facility], [place], [place] [facility].  Elissa Hefty, LCSW Clinical Social Worker [phone]

## 2017-04-22 DIAGNOSIS — S82201A Unspecified fracture of shaft of right tibia, initial encounter for closed fracture: Secondary | ICD-10-CM | POA: Diagnosis not present

## 2017-04-23 DIAGNOSIS — S82143A Displaced bicondylar fracture of unspecified tibia, initial encounter for closed fracture: Secondary | ICD-10-CM | POA: Diagnosis not present

## 2017-04-23 DIAGNOSIS — S82209A Unspecified fracture of shaft of unspecified tibia, initial encounter for closed fracture: Secondary | ICD-10-CM | POA: Diagnosis not present

## 2017-04-23 DIAGNOSIS — E119 Type 2 diabetes mellitus without complications: Secondary | ICD-10-CM | POA: Diagnosis not present

## 2017-04-23 DIAGNOSIS — Z7409 Other reduced mobility: Secondary | ICD-10-CM | POA: Diagnosis not present

## 2017-04-24 DIAGNOSIS — S82201A Unspecified fracture of shaft of right tibia, initial encounter for closed fracture: Secondary | ICD-10-CM | POA: Diagnosis not present

## 2017-04-27 ENCOUNTER — Ambulatory Visit (INDEPENDENT_AMBULATORY_CARE_PROVIDER_SITE_OTHER): Payer: No Typology Code available for payment source

## 2017-04-27 ENCOUNTER — Ambulatory Visit (INDEPENDENT_AMBULATORY_CARE_PROVIDER_SITE_OTHER): Payer: Medicare Other | Admitting: Orthopaedic Surgery

## 2017-04-27 DIAGNOSIS — S82231A Displaced oblique fracture of shaft of right tibia, initial encounter for closed fracture: Secondary | ICD-10-CM

## 2017-04-27 DIAGNOSIS — S82401D Unspecified fracture of shaft of right fibula, subsequent encounter for closed fracture with routine healing: Secondary | ICD-10-CM | POA: Diagnosis not present

## 2017-04-27 DIAGNOSIS — S82201D Unspecified fracture of shaft of right tibia, subsequent encounter for closed fracture with routine healing: Secondary | ICD-10-CM

## 2017-04-27 NOTE — Progress Notes (Signed)
Patient is 11 days status post intramedullary nailing of a right tibia fracture.  He is overall doing well.  He is currently staying at a skilled nursing facility.  His pain is well controlled.  Denies any calf pain or chest pain or shortness of breath  Physical exam shows healed incisions without any signs of infection or drainage.  Compartments are soft.  Neurovascular intact.  X-rays show stable fixation and alignment of the fracture without complication.  Staples were removed today.  Continue nonweightbearing for 4 more weeks.  He may back down to aspirin 325 daily.  Follow-up in 4 weeks with repeat 2 view x-rays of the right tib-fib.  Questions encouraged and answered.

## 2017-04-30 DIAGNOSIS — S82209A Unspecified fracture of shaft of unspecified tibia, initial encounter for closed fracture: Secondary | ICD-10-CM | POA: Diagnosis not present

## 2017-04-30 DIAGNOSIS — Z7409 Other reduced mobility: Secondary | ICD-10-CM | POA: Diagnosis not present

## 2017-04-30 DIAGNOSIS — E119 Type 2 diabetes mellitus without complications: Secondary | ICD-10-CM | POA: Diagnosis not present

## 2017-04-30 DIAGNOSIS — Z114 Encounter for screening for human immunodeficiency virus [HIV]: Secondary | ICD-10-CM | POA: Diagnosis not present

## 2017-04-30 DIAGNOSIS — S82201A Unspecified fracture of shaft of right tibia, initial encounter for closed fracture: Secondary | ICD-10-CM | POA: Diagnosis not present

## 2017-05-05 DIAGNOSIS — S82201A Unspecified fracture of shaft of right tibia, initial encounter for closed fracture: Secondary | ICD-10-CM | POA: Diagnosis not present

## 2017-05-11 DIAGNOSIS — E785 Hyperlipidemia, unspecified: Secondary | ICD-10-CM | POA: Diagnosis not present

## 2017-05-11 DIAGNOSIS — D509 Iron deficiency anemia, unspecified: Secondary | ICD-10-CM | POA: Diagnosis not present

## 2017-05-15 ENCOUNTER — Emergency Department (HOSPITAL_COMMUNITY)
Admission: EM | Admit: 2017-05-15 | Discharge: 2017-05-15 | Disposition: A | Discharge status: Home / Self Care | Payer: Medicare Other | Attending: Emergency Medicine | Admitting: Emergency Medicine

## 2017-05-15 ENCOUNTER — Emergency Department (HOSPITAL_COMMUNITY): Payer: Medicare Other

## 2017-05-15 ENCOUNTER — Encounter (HOSPITAL_COMMUNITY): Payer: Self-pay | Admitting: Emergency Medicine

## 2017-05-15 DIAGNOSIS — M79604 Pain in right leg: Secondary | ICD-10-CM | POA: Insufficient documentation

## 2017-05-15 DIAGNOSIS — F17210 Nicotine dependence, cigarettes, uncomplicated: Secondary | ICD-10-CM | POA: Insufficient documentation

## 2017-05-15 DIAGNOSIS — Z79899 Other long term (current) drug therapy: Secondary | ICD-10-CM | POA: Insufficient documentation

## 2017-05-15 DIAGNOSIS — E119 Type 2 diabetes mellitus without complications: Secondary | ICD-10-CM | POA: Insufficient documentation

## 2017-05-15 DIAGNOSIS — R451 Restlessness and agitation: Secondary | ICD-10-CM | POA: Diagnosis not present

## 2017-05-15 DIAGNOSIS — I10 Essential (primary) hypertension: Secondary | ICD-10-CM | POA: Insufficient documentation

## 2017-05-15 DIAGNOSIS — J449 Chronic obstructive pulmonary disease, unspecified: Secondary | ICD-10-CM | POA: Insufficient documentation

## 2017-05-15 DIAGNOSIS — S82131A Displaced fracture of medial condyle of right tibia, initial encounter for closed fracture: Secondary | ICD-10-CM

## 2017-05-15 DIAGNOSIS — Z7901 Long term (current) use of anticoagulants: Secondary | ICD-10-CM | POA: Insufficient documentation

## 2017-05-15 DIAGNOSIS — M25561 Pain in right knee: Secondary | ICD-10-CM | POA: Diagnosis not present

## 2017-05-15 DIAGNOSIS — S82121A Displaced fracture of lateral condyle of right tibia, initial encounter for closed fracture: Secondary | ICD-10-CM | POA: Diagnosis not present

## 2017-05-15 DIAGNOSIS — F29 Unspecified psychosis not due to a substance or known physiological condition: Secondary | ICD-10-CM | POA: Diagnosis not present

## 2017-05-15 DIAGNOSIS — M79671 Pain in right foot: Secondary | ICD-10-CM | POA: Diagnosis not present

## 2017-05-15 DIAGNOSIS — M25571 Pain in right ankle and joints of right foot: Secondary | ICD-10-CM | POA: Diagnosis not present
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
## 2017-05-15 NOTE — ED Notes (Signed)
Patient assisted by EDP to contact his mother on the telephone.

## 2017-05-15 NOTE — ED Notes (Signed)
Placed pt on bed pan.

## 2017-05-15 NOTE — ED Notes (Signed)
Patient transported to X-ray 

## 2017-05-18 ENCOUNTER — Telehealth (INDEPENDENT_AMBULATORY_CARE_PROVIDER_SITE_OTHER): Payer: Self-pay

## 2017-05-18 ENCOUNTER — Telehealth (INDEPENDENT_AMBULATORY_CARE_PROVIDER_SITE_OTHER): Payer: Self-pay | Admitting: Orthopaedic Surgery

## 2017-05-18 NOTE — Telephone Encounter (Signed)
yes

## 2017-05-18 NOTE — Telephone Encounter (Signed)
Tatiana Physical therapist from [facility] called Left voicemail and would like to know the WB status for patient.  CB [phone]

## 2017-05-18 NOTE — Telephone Encounter (Signed)
NWB

## 2017-05-18 NOTE — Telephone Encounter (Signed)
Verbal orders for PT,OT,Nurse Visit & Education officer, museum.

## 2017-05-18 NOTE — Telephone Encounter (Signed)
See message below °

## 2017-05-19 ENCOUNTER — Telehealth (INDEPENDENT_AMBULATORY_CARE_PROVIDER_SITE_OTHER): Payer: Self-pay | Admitting: Orthopaedic Surgery

## 2017-05-19 NOTE — Telephone Encounter (Signed)
Called Tatiana no answer, LMOM. NWB

## 2017-05-19 NOTE — Telephone Encounter (Signed)
Verbal orders for physical therapy. Needs  1 visit for 1 week and  2 visits for 4 weeks  Newell Coral, PT # [phone]

## 2017-05-19 NOTE — Telephone Encounter (Signed)
Is this okay?

## 2017-05-19 NOTE — Telephone Encounter (Signed)
Called Diane to advise Dr Erlinda Hong approved orders

## 2017-05-19 NOTE — Telephone Encounter (Signed)
yes

## 2017-05-20 NOTE — Telephone Encounter (Signed)
Called Tatiana LMOM okay per orders

## 2017-05-22 ENCOUNTER — Telehealth (INDEPENDENT_AMBULATORY_CARE_PROVIDER_SITE_OTHER): Payer: Self-pay | Admitting: Orthopaedic Surgery

## 2017-05-22 NOTE — Telephone Encounter (Signed)
Peter [name], Peter [name] 27-Feb-1951   [facility]  [phone] Verbal orders   One time every other week 2 times a week for four weeks

## 2017-05-25 ENCOUNTER — Ambulatory Visit (INDEPENDENT_AMBULATORY_CARE_PROVIDER_SITE_OTHER): Payer: Medicare Other | Admitting: Orthopaedic Surgery

## 2017-05-25 DIAGNOSIS — S82231A Displaced oblique fracture of shaft of right tibia, initial encounter for closed fracture: Secondary | ICD-10-CM

## 2017-05-25 DIAGNOSIS — S82201D Unspecified fracture of shaft of right tibia, subsequent encounter for closed fracture with routine healing: Secondary | ICD-10-CM

## 2017-05-25 DIAGNOSIS — S82121A Displaced fracture of lateral condyle of right tibia, initial encounter for closed fracture: Secondary | ICD-10-CM

## 2017-05-25 DIAGNOSIS — S82401D Unspecified fracture of shaft of right fibula, subsequent encounter for closed fracture with routine healing: Secondary | ICD-10-CM

## 2017-05-25 NOTE — Telephone Encounter (Signed)
See message. Patient was seen today.

## 2017-05-25 NOTE — Telephone Encounter (Signed)
Called to advise.  

## 2017-05-25 NOTE — Progress Notes (Signed)
Office Visit Note   Patient: Peter [name]           Date of Birth: May 26, 1951           MRN: [id number] Visit Date: 05/25/2017              Requested by: Romana Juniper, MD No address on file PCP: Romana Juniper, MD   Assessment & Plan: Visit Diagnoses:  1. Displaced oblique fracture of shaft of right tibia, initial encounter for closed fracture   2. Closed fracture of right tibia and fibula with routine healing, subsequent encounter   3. Closed fracture of lateral portion of right tibial plateau, initial encounter     Plan: Patient is 6 weeks status post intramedullary nailing of a right tibia fracture with a new lateral tibial plateau fracture.  This appears to be amenable to nonoperative.  This appears to be stable to weight-bear in a fracture boot with a walker.  Updated instructions for physical therapy at home was provided today.  Follow-up in 6 weeks with 2 view x-rays of the right tib-fib.  Follow-Up Instructions: Return in about 6 weeks (around 07/06/2017).   Orders:  No orders of the defined types were placed in this encounter.  No orders of the defined types were placed in this encounter.     Procedures: No procedures performed   Clinical Data: No additional findings.   Subjective: No chief complaint on file.   Patient comes in for follow-up of his tibial fracture plus a new problem of nondisplaced lateral tibial plateau fracture from a fall.  He has been noncompliant with nonweightbearing.  He has been noncompliant with physical therapy also.    Review of Systems   Objective: Vital Signs: There were no vitals taken for this visit.  Physical Exam  Ortho Exam Right knee exam shows fully healed surgical scar.  Small joint effusion. Specialty Comments:  No specialty comments available.  Imaging: No results found.   PMFS History: Patient Active Problem List   Diagnosis Date Noted  . Aortic atherosclerosis ([place]) 04/21/2017  . Iron  deficiency anemia 04/21/2017  . Nodule of upper lobe of right lung 04/21/2017  . Closed fracture of right fibula and tibia   . Displaced fracture of fifth metatarsal bone, right foot, initial encounter for closed fracture   . Postoperative fever 04/19/2017  . Displaced oblique fracture of shaft of right tibia, initial encounter for closed fracture 04/16/2017  . Closed fracture of lateral portion of right tibial plateau 04/16/2017  . Tibial fracture 04/16/2017  . Hypotension, postural 01/05/2015  . UTI (lower urinary tract infection) 01/05/2015  . Acute renal failure ([place]) 01/05/2015  . ARF (acute renal failure) ([place]) 01/05/2015  . Closed nondisplaced fracture of greater trochanter of right femur with routine healing 11/23/2014  . Alcohol withdrawal ([place]) 11/21/2014  . Acute confusional state 11/21/2014  . Sepsis ([place]) 05/25/2014  . Fever 05/25/2014  . Malnutrition of moderate degree ([place]) 05/25/2014  . Alcohol abuse   . Smoker 12/15/2013  . Acute respiratory failure ([place]) 10/28/2013  . Delirium tremens ([place]) 10/28/2013  . Acute respiratory distress syndrome (ARDS) ([place]) 10/28/2013  . Systolic CHF ([place]) 51/70/0174  . Tobacco abuse 10/27/2013  . HCAP (healthcare-associated pneumonia) 10/25/2013  . Weakness 08/01/2013  . Dizziness 04/06/2013  . Chronic alcohol abuse 02/16/2013  . Liver cirrhosis, alcoholic ([place]) 94/49/6759  . Depression (emotion) 02/16/2013  . COPD, moderate ([place]) 02/16/2013  . Aspiration pneumonia ([place]) 01/16/2013  . UTI (urinary tract  infection) 01/16/2013  . Hypokalemia 01/15/2013  . Hypomagnesemia 01/15/2013  . Fever, unspecified 01/15/2013  . Leukopenia 01/13/2013  . Orthostatic hypotension 01/13/2013  . Alcohol intoxication ([place]) 01/12/2013  . Syncope 01/12/2013  . Lactic acidosis 01/12/2013  . Cirrhosis ([place]) 01/12/2013  . History of stroke 01/12/2013  . Acute alcohol intoxication ([place]) 08/30/2012  . Iron deficiency 02/04/2012  . Hyponatremia  01/31/2012  . COPD (chronic obstructive pulmonary disease) ([place]) 01/03/2012   Past Medical History:  Diagnosis Date  . Anemia    iron def.  . Anxiety   . Arthritis   . Bone spur of other site    Left Foot  . Cataract   . Chronic pain   . Cirrhosis of liver ([place])   . COPD (chronic obstructive pulmonary disease) ([place])   . Dental injury   . Depression   . Diabetes mellitus without complication ([place])   . Elevated LFTs   . Emphysema   . Emphysema   . Emphysema of lung ([place])   . ETOH abuse   . Fall   . Glaucoma   . Hard of hearing   . Hyperlipemia   . Hypertension   . Impaired mobility   . Pneumonia   . Sinusitis   . Stroke ([place])   . Total self care deficit     Family History  Problem Relation Age of Onset  . Breast cancer Mother   . Diabetes Maternal Aunt   . Heart disease Sister   . Heart disease Maternal Aunt     Past Surgical History:  Procedure Laterality Date  . bone spur  2013   left spur  . CATARACT EXTRACTION W/ INTRAOCULAR LENS IMPLANT    . COLONOSCOPY    . COLONOSCOPY N/A 01/06/2012   Performed by Beryle Beams, MD at [facility]  . ESOPHAGOGASTRODUODENOSCOPY (EGD) N/A 01/09/2012   Performed by Beryle Beams, MD at [facility]  . ESOPHAGOGASTRODUODENOSCOPY (EGD) N/A 01/06/2012   Performed by Beryle Beams, MD at [facility]  . IM NAILING TIBIA Right 04/16/2017  . INTRAMEDULLARY (IM) NAIL TIBIAL Right 04/16/2017   Performed by Leandrew Koyanagi, MD at [facility]  . MOUTH SURGERY    . MULTIPLE EXTRACTION WITH ALVEOLOPLASTY Bilateral 03/29/2012   Performed by Gae Bon, DDS at [facility] History   Occupational History  . Occupation: disabled  Tobacco Use  . Smoking status: Current Every Day Smoker    Packs/day: 1.00    Years: 30.00    Pack years: 30.00    Types: Cigarettes  . Smokeless tobacco: Current User  Substance and Sexual Activity  . Alcohol use: Yes    Comment: says quit May 2016  . Drug use: Yes    Types: Cocaine    Comment:  patient smoked crack cocaine today  . Sexual activity: Yes    Comment: no birth control

## 2017-05-25 NOTE — Telephone Encounter (Signed)
Yes

## 2017-05-26 ENCOUNTER — Telehealth (INDEPENDENT_AMBULATORY_CARE_PROVIDER_SITE_OTHER): Payer: Self-pay | Admitting: Orthopaedic Surgery

## 2017-05-26 NOTE — Telephone Encounter (Signed)
Tillie Rung from [facility] called stating that she has a question about one of the patient's medications.  She would like you to call her back.  CB#[phone].  Thank you.

## 2017-05-27 NOTE — Telephone Encounter (Signed)
Called Peter [name]  Back and we have no prescribed any meds per Epic

## 2017-06-02 ENCOUNTER — Telehealth (INDEPENDENT_AMBULATORY_CARE_PROVIDER_SITE_OTHER): Payer: Self-pay | Admitting: Radiology

## 2017-06-02 DIAGNOSIS — Z8673 Personal history of transient ischemic attack (TIA), and cerebral infarction without residual deficits: Secondary | ICD-10-CM | POA: Diagnosis not present

## 2017-06-02 DIAGNOSIS — Z0000 Encounter for general adult medical examination without abnormal findings: Secondary | ICD-10-CM | POA: Diagnosis not present

## 2017-06-02 DIAGNOSIS — Z1211 Encounter for screening for malignant neoplasm of colon: Secondary | ICD-10-CM | POA: Diagnosis not present

## 2017-06-02 DIAGNOSIS — J449 Chronic obstructive pulmonary disease, unspecified: Secondary | ICD-10-CM | POA: Diagnosis not present

## 2017-06-02 DIAGNOSIS — Z720 Tobacco use: Secondary | ICD-10-CM | POA: Diagnosis not present

## 2017-06-02 DIAGNOSIS — M6281 Muscle weakness (generalized): Secondary | ICD-10-CM | POA: Diagnosis not present

## 2017-06-02 DIAGNOSIS — I5022 Chronic systolic (congestive) heart failure: Secondary | ICD-10-CM | POA: Diagnosis not present

## 2017-06-02 NOTE — Telephone Encounter (Signed)
WBAT

## 2017-06-02 NOTE — Telephone Encounter (Signed)
Please advise 

## 2017-06-02 NOTE — Telephone Encounter (Signed)
Needs to know weightbearing status for his Right Lower extremity.  Please give Peter [name] a call back @ [phone] to advise.

## 2017-06-03 NOTE — Telephone Encounter (Signed)
Tried to call number below  states number has been disconnected.

## 2017-06-17 DIAGNOSIS — I10 Essential (primary) hypertension: Secondary | ICD-10-CM | POA: Diagnosis not present

## 2017-06-17 DIAGNOSIS — I509 Heart failure, unspecified: Secondary | ICD-10-CM | POA: Diagnosis not present

## 2017-07-02 DIAGNOSIS — M6281 Muscle weakness (generalized): Secondary | ICD-10-CM | POA: Diagnosis not present

## 2017-07-02 DIAGNOSIS — Z720 Tobacco use: Secondary | ICD-10-CM | POA: Diagnosis not present

## 2017-07-02 DIAGNOSIS — I10 Essential (primary) hypertension: Secondary | ICD-10-CM | POA: Diagnosis not present

## 2017-07-02 DIAGNOSIS — J449 Chronic obstructive pulmonary disease, unspecified: Secondary | ICD-10-CM | POA: Diagnosis not present

## 2017-07-02 DIAGNOSIS — E782 Mixed hyperlipidemia: Secondary | ICD-10-CM | POA: Diagnosis not present

## 2017-07-09 ENCOUNTER — Encounter (INDEPENDENT_AMBULATORY_CARE_PROVIDER_SITE_OTHER): Payer: Self-pay | Admitting: Orthopaedic Surgery

## 2017-07-09 ENCOUNTER — Ambulatory Visit (INDEPENDENT_AMBULATORY_CARE_PROVIDER_SITE_OTHER): Payer: Medicare Other

## 2017-07-09 ENCOUNTER — Ambulatory Visit (INDEPENDENT_AMBULATORY_CARE_PROVIDER_SITE_OTHER): Payer: Medicare Other | Admitting: Orthopaedic Surgery

## 2017-07-09 DIAGNOSIS — S82231A Displaced oblique fracture of shaft of right tibia, initial encounter for closed fracture: Secondary | ICD-10-CM | POA: Diagnosis not present

## 2017-07-09 NOTE — Progress Notes (Signed)
Patient ID: Peter [name], male   DOB: 1950/09/14, 67 y.o.   MRN: [id number]  Patient is almost 3 months status post intramedullary fixation of right tibia fracture.  He is overall improving.  He continues to do home health physical therapy.  He is complaining of the proximal interlocking screw bothering him.  Otherwise he feels like his leg is improving overall.  His pain is minimal.  Surgical scars are fully healed.  No joint effusion.  He has excellent range of motion of the knee.  He is walking with an antalgic gait with a cane.  His x-rays show evidence of continued healing and bony consolidation and callus formation.  At this point I want him to discontinue the cam walker.  Continue with physical therapy.  Follow-up in 2 months with repeat 2 view x-rays of the right tib-fib.  Questions encouraged and answered.

## 2017-07-30 DIAGNOSIS — M6281 Muscle weakness (generalized): Secondary | ICD-10-CM | POA: Diagnosis not present

## 2017-07-30 DIAGNOSIS — J449 Chronic obstructive pulmonary disease, unspecified: Secondary | ICD-10-CM | POA: Diagnosis not present

## 2017-07-30 DIAGNOSIS — I5022 Chronic systolic (congestive) heart failure: Secondary | ICD-10-CM | POA: Diagnosis not present

## 2017-07-30 DIAGNOSIS — I639 Cerebral infarction, unspecified: Secondary | ICD-10-CM | POA: Diagnosis not present

## 2017-09-07 ENCOUNTER — Ambulatory Visit (INDEPENDENT_AMBULATORY_CARE_PROVIDER_SITE_OTHER): Payer: Medicare Other | Admitting: Orthopaedic Surgery

## 2017-09-19 DIAGNOSIS — I5022 Chronic systolic (congestive) heart failure: Secondary | ICD-10-CM | POA: Diagnosis not present

## 2017-09-19 DIAGNOSIS — E782 Mixed hyperlipidemia: Secondary | ICD-10-CM | POA: Diagnosis not present

## 2017-09-19 DIAGNOSIS — I639 Cerebral infarction, unspecified: Secondary | ICD-10-CM | POA: Diagnosis not present

## 2017-09-19 DIAGNOSIS — Z9119 Patient's noncompliance with other medical treatment and regimen: Secondary | ICD-10-CM | POA: Diagnosis not present

## 2017-09-25 ENCOUNTER — Ambulatory Visit (INDEPENDENT_AMBULATORY_CARE_PROVIDER_SITE_OTHER): Payer: Medicare Other | Admitting: Orthopaedic Surgery

## 2017-09-25 ENCOUNTER — Encounter (INDEPENDENT_AMBULATORY_CARE_PROVIDER_SITE_OTHER): Payer: Self-pay | Admitting: Orthopaedic Surgery

## 2017-09-25 ENCOUNTER — Ambulatory Visit (INDEPENDENT_AMBULATORY_CARE_PROVIDER_SITE_OTHER): Payer: Medicare Other

## 2017-09-25 DIAGNOSIS — S82401D Unspecified fracture of shaft of right fibula, subsequent encounter for closed fracture with routine healing: Secondary | ICD-10-CM | POA: Diagnosis not present

## 2017-09-25 DIAGNOSIS — M79604 Pain in right leg: Secondary | ICD-10-CM

## 2017-09-25 DIAGNOSIS — S82201D Unspecified fracture of shaft of right tibia, subsequent encounter for closed fracture with routine healing: Secondary | ICD-10-CM

## 2017-09-25 NOTE — Progress Notes (Signed)
Post-Op Visit Note   Patient: Peter [name]           Date of Birth: [birth year]           MRN: [id number] Visit Date: 09/25/2017 PCP: Romana Juniper, MD   Assessment & Plan:  Chief Complaint:  Chief Complaint  Patient presents with  . Right Leg - Pain   Visit Diagnoses:  1. Closed fracture of right tibia and fibula with routine healing, subsequent encounter   2. Pain in right leg     Plan: Alba comes in for follow-up.  5 months status post intramedullary nail right tibia, date of surgery 04/16/2017.  He has been doing fairly well.  Finished outpatient physical therapy and is ambulating without the use of a cane or walker.  Doing well with that.  He is regained near full motion and strength.  He does note occasional paresthesias to the right leg and some pain to the distal interlock screw.  Otherwise, doing well.  Examination of his right leg reveals range of motion of the knee from 0-125 degrees.  Minimal tenderness over the proximal most distal interlock screw.  No calf tenderness.  He is neurovascular intact distally.  At this point he will continue to work on strength.  Follow-up with [place] in 4 months for repeat xray.  Follow-Up Instructions: Return in about 4 months (around 01/25/2018).   Orders:  Orders Placed This Encounter  Procedures  . XR Tibia/Fibula Right   No orders of the defined types were placed in this encounter.   Imaging: Xr Tibia/fibula Right  Result Date: 09/25/2017 X-rays show great callus formation with stable alignment of the fracture   PMFS History: Patient Active Problem List   Diagnosis Date Noted  . Pain in right leg 09/25/2017  . Aortic atherosclerosis ([place]) 04/21/2017  . Iron deficiency anemia 04/21/2017  . Nodule of upper lobe of right lung 04/21/2017  . Closed fracture of right fibula and tibia   . Displaced fracture of fifth metatarsal bone, right foot, initial encounter for closed fracture   . Postoperative fever 04/19/2017  .  Displaced oblique fracture of shaft of right tibia, initial encounter for closed fracture 04/16/2017  . Closed fracture of lateral portion of right tibial plateau 04/16/2017  . Tibial fracture 04/16/2017  . Hypotension, postural 01/05/2015  . UTI (lower urinary tract infection) 01/05/2015  . Acute renal failure ([place]) 01/05/2015  . ARF (acute renal failure) ([place]) 01/05/2015  . Closed nondisplaced fracture of greater trochanter of right femur with routine healing 11/23/2014  . Alcohol withdrawal ([place]) 11/21/2014  . Acute confusional state 11/21/2014  . Sepsis ([place]) 05/25/2014  . Fever 05/25/2014  . Malnutrition of moderate degree ([place]) 05/25/2014  . Alcohol abuse   . Smoker 12/15/2013  . Acute respiratory failure ([place]) 10/28/2013  . Delirium tremens ([place]) 10/28/2013  . Acute respiratory distress syndrome (ARDS) ([place]) 10/28/2013  . Systolic CHF ([place]) 85/46/2703  . Tobacco abuse 10/27/2013  . HCAP (healthcare-associated pneumonia) 10/25/2013  . Weakness 08/01/2013  . Dizziness 04/06/2013  . Chronic alcohol abuse 02/16/2013  . Liver cirrhosis, alcoholic ([place]) 50/03/3817  . Depression (emotion) 02/16/2013  . COPD, moderate ([place]) 02/16/2013  . Aspiration pneumonia ([place]) 01/16/2013  . UTI (urinary tract infection) 01/16/2013  . Hypokalemia 01/15/2013  . Hypomagnesemia 01/15/2013  . Fever, unspecified 01/15/2013  . Leukopenia 01/13/2013  . Orthostatic hypotension 01/13/2013  . Alcohol intoxication ([place]) 01/12/2013  . Syncope 01/12/2013  . Lactic acidosis 01/12/2013  . Cirrhosis ([place])  01/12/2013  . History of stroke 01/12/2013  . Acute alcohol intoxication ([place]) 08/30/2012  . Iron deficiency 02/04/2012  . Hyponatremia 01/31/2012  . COPD (chronic obstructive pulmonary disease) ([place]) 01/03/2012   Past Medical History:  Diagnosis Date  . Anemia    iron def.  . Anxiety   . Arthritis   . Bone spur of other site    Left Foot  . Cataract   . Chronic pain   . Cirrhosis of liver  ([place])   . COPD (chronic obstructive pulmonary disease) ([place])   . Dental injury   . Depression   . Diabetes mellitus without complication ([place])   . Elevated LFTs   . Emphysema   . Emphysema   . Emphysema of lung ([place])   . ETOH abuse   . Fall   . Glaucoma   . Hard of hearing   . Hyperlipemia   . Hypertension   . Impaired mobility   . Pneumonia   . Sinusitis   . Stroke ([place])   . Total self care deficit     Family History  Problem Relation Age of Onset  . Breast cancer Mother   . Diabetes Maternal Aunt   . Heart disease Sister   . Heart disease Maternal Aunt     Past Surgical History:  Procedure Laterality Date  . bone spur  2013   left spur  . CATARACT EXTRACTION W/ INTRAOCULAR LENS IMPLANT    . COLONOSCOPY    . ESOPHAGOGASTRODUODENOSCOPY  01/09/2012   Procedure: ESOPHAGOGASTRODUODENOSCOPY (EGD);  Surgeon: Beryle Beams, MD;  Location[facility] ENDOSCOPY;  Service: Endoscopy;  Laterality: N/A;  . IM NAILING TIBIA Right 04/16/2017  . MOUTH SURGERY    . MULTIPLE EXTRACTIONS WITH ALVEOLOPLASTY  03/29/2012   Procedure: MULTIPLE EXTRACION WITH ALVEOLOPLASTY;  Surgeon: Gae Bon, DDS;  Location: [facility];  Service: Oral Surgery;  Laterality: Bilateral;  . TIBIA IM NAIL INSERTION Right 04/16/2017   Procedure: INTRAMEDULLARY (IM) NAIL TIBIAL;  Surgeon: Leandrew Koyanagi, MD;  Location: [facility];  Service: Orthopedics;  Laterality: Right;   Social History   Occupational History  . Occupation: disabled  Tobacco Use  . Smoking status: Current Every Day Smoker    Packs/day: 1.00    Years: 30.00    Pack years: 30.00    Types: Cigarettes  . Smokeless tobacco: Current User  Substance and Sexual Activity  . Alcohol use: Yes    Comment: says quit May 2016  . Drug use: Yes    Types: Cocaine    Comment: patient smoked crack cocaine today  . Sexual activity: Yes    Comment: no birth control

## 2017-11-05 DIAGNOSIS — D509 Iron deficiency anemia, unspecified: Secondary | ICD-10-CM | POA: Diagnosis not present

## 2017-11-05 DIAGNOSIS — I5022 Chronic systolic (congestive) heart failure: Secondary | ICD-10-CM | POA: Diagnosis not present

## 2017-11-05 DIAGNOSIS — J449 Chronic obstructive pulmonary disease, unspecified: Secondary | ICD-10-CM | POA: Diagnosis not present

## 2017-11-05 DIAGNOSIS — Z9119 Patient's noncompliance with other medical treatment and regimen: Secondary | ICD-10-CM | POA: Diagnosis not present

## 2017-11-05 DIAGNOSIS — I639 Cerebral infarction, unspecified: Secondary | ICD-10-CM | POA: Diagnosis not present

## 2017-12-25 DIAGNOSIS — J449 Chronic obstructive pulmonary disease, unspecified: Secondary | ICD-10-CM | POA: Diagnosis not present

## 2017-12-25 DIAGNOSIS — I5022 Chronic systolic (congestive) heart failure: Secondary | ICD-10-CM | POA: Diagnosis not present

## 2017-12-25 DIAGNOSIS — I10 Essential (primary) hypertension: Secondary | ICD-10-CM | POA: Diagnosis not present

## 2017-12-25 DIAGNOSIS — Z9119 Patient's noncompliance with other medical treatment and regimen: Secondary | ICD-10-CM | POA: Diagnosis not present

## 2017-12-25 DIAGNOSIS — E782 Mixed hyperlipidemia: Secondary | ICD-10-CM | POA: Diagnosis not present

## 2017-12-25 DIAGNOSIS — I639 Cerebral infarction, unspecified: Secondary | ICD-10-CM | POA: Diagnosis not present

## 2018-01-25 ENCOUNTER — Ambulatory Visit (INDEPENDENT_AMBULATORY_CARE_PROVIDER_SITE_OTHER): Payer: Medicare Other | Admitting: Orthopaedic Surgery

## 2018-01-26 ENCOUNTER — Ambulatory Visit (INDEPENDENT_AMBULATORY_CARE_PROVIDER_SITE_OTHER): Payer: Medicare Other | Admitting: Orthopaedic Surgery

## 2018-01-26 ENCOUNTER — Ambulatory Visit (INDEPENDENT_AMBULATORY_CARE_PROVIDER_SITE_OTHER): Payer: Medicare Other

## 2018-01-26 DIAGNOSIS — S82401D Unspecified fracture of shaft of right fibula, subsequent encounter for closed fracture with routine healing: Secondary | ICD-10-CM

## 2018-01-26 DIAGNOSIS — S82201D Unspecified fracture of shaft of right tibia, subsequent encounter for closed fracture with routine healing: Secondary | ICD-10-CM

## 2018-01-26 NOTE — Progress Notes (Signed)
Office Visit Note   Patient: Peter [name]           Date of Birth: [birth year]           MRN: [id number] Visit Date: 01/26/2018              Requested by: Romana Juniper, MD No address on file PCP: Romana Juniper, MD   Assessment & Plan: Visit Diagnoses:  1. Closed fracture of right tibia and fibula with routine healing, subsequent encounter     Plan: Impression is hardware from the interlocking screws.  X-rays were reviewed with the patient identified the screws that are most likely causing him symptoms.  At this point his fracture is fully healed and so I do recommend removal of the screws if this continues to be a problem.  He will think about this.  Rehab and recovery from the interlocking screw discussed with the patient.  We will await to hear back from the patient when he is ready for screw removal.  He is symptomatic over the proximal proximal interlocking screw and proximal distal interlocking screw.  Follow-Up Instructions: Return if symptoms worsen or fail to improve.   Orders:  Orders Placed This Encounter  Procedures  . XR Tibia/Fibula Right   No orders of the defined types were placed in this encounter.     Procedures: No procedures performed   Clinical Data: No additional findings.   Subjective: Chief Complaint  Patient presents with  . Right Lower Leg - Follow-up    IM nail tibial, right 04/16/17    Patient is 9 months status post intramedullary tibial nail fixation.  He is overall doing well.  He is mainly complaining of some discomfort over the interlocking screw heads.   Review of Systems   Objective: Vital Signs: There were no vitals taken for this visit.  Physical Exam  Ortho Exam Right lower extremity exam shows no signs of infection or swelling.  Fully healed surgical scars.  He does have a prominent and symptomatic proximal distal interlocking screw and proximal proximal interlocking screw. Specialty Comments:  No specialty  comments available.  Imaging: Xr Tibia/fibula Right  Result Date: 01/26/2018 Fully healed tibia fracture.  No hardware complications other than some prominence of the screw heads.    PMFS History: Patient Active Problem List   Diagnosis Date Noted  . Pain in right leg 09/25/2017  . Aortic atherosclerosis ([place]) 04/21/2017  . Iron deficiency anemia 04/21/2017  . Nodule of upper lobe of right lung 04/21/2017  . Closed fracture of right fibula and tibia   . Displaced fracture of fifth metatarsal bone, right foot, initial encounter for closed fracture   . Postoperative fever 04/19/2017  . Displaced oblique fracture of shaft of right tibia, initial encounter for closed fracture 04/16/2017  . Closed fracture of lateral portion of right tibial plateau 04/16/2017  . Tibial fracture 04/16/2017  . Hypotension, postural 01/05/2015  . UTI (lower urinary tract infection) 01/05/2015  . Acute renal failure ([place]) 01/05/2015  . ARF (acute renal failure) ([place]) 01/05/2015  . Closed nondisplaced fracture of greater trochanter of right femur with routine healing 11/23/2014  . Alcohol withdrawal ([place]) 11/21/2014  . Acute confusional state 11/21/2014  . Sepsis ([place]) 05/25/2014  . Fever 05/25/2014  . Malnutrition of moderate degree ([place]) 05/25/2014  . Alcohol abuse   . Smoker 12/15/2013  . Acute respiratory failure ([place]) 10/28/2013  . Delirium tremens ([place]) 10/28/2013  . Acute respiratory distress syndrome (ARDS) ([place]) 10/28/2013  .  Systolic CHF ([place]) 46/56/8127  . Tobacco abuse 10/27/2013  . HCAP (healthcare-associated pneumonia) 10/25/2013  . Weakness 08/01/2013  . Dizziness 04/06/2013  . Chronic alcohol abuse 02/16/2013  . Liver cirrhosis, alcoholic ([place]) 51/70/0174  . Depression (emotion) 02/16/2013  . COPD, moderate ([place]) 02/16/2013  . Aspiration pneumonia ([place]) 01/16/2013  . UTI (urinary tract infection) 01/16/2013  . Hypokalemia 01/15/2013  . Hypomagnesemia 01/15/2013  . Fever,  unspecified 01/15/2013  . Leukopenia 01/13/2013  . Orthostatic hypotension 01/13/2013  . Alcohol intoxication ([place]) 01/12/2013  . Syncope 01/12/2013  . Lactic acidosis 01/12/2013  . Cirrhosis ([place]) 01/12/2013  . History of stroke 01/12/2013  . Acute alcohol intoxication ([place]) 08/30/2012  . Iron deficiency 02/04/2012  . Hyponatremia 01/31/2012  . COPD (chronic obstructive pulmonary disease) ([place]) 01/03/2012   Past Medical History:  Diagnosis Date  . Anemia    iron def.  . Anxiety   . Arthritis   . Bone spur of other site    Left Foot  . Cataract   . Chronic pain   . Cirrhosis of liver ([place])   . COPD (chronic obstructive pulmonary disease) ([place])   . Dental injury   . Depression   . Diabetes mellitus without complication ([place])   . Elevated LFTs   . Emphysema   . Emphysema   . Emphysema of lung ([place])   . ETOH abuse   . Fall   . Glaucoma   . Hard of hearing   . Hyperlipemia   . Hypertension   . Impaired mobility   . Pneumonia   . Sinusitis   . Stroke ([place])   . Total self care deficit     Family History  Problem Relation Age of Onset  . Breast cancer Mother   . Diabetes Maternal Aunt   . Heart disease Sister   . Heart disease Maternal Aunt     Past Surgical History:  Procedure Laterality Date  . bone spur  2013   left spur  . CATARACT EXTRACTION W/ INTRAOCULAR LENS IMPLANT    . COLONOSCOPY    . ESOPHAGOGASTRODUODENOSCOPY  01/09/2012   Procedure: ESOPHAGOGASTRODUODENOSCOPY (EGD);  Surgeon: Beryle Beams, MD;  Location[facility] ENDOSCOPY;  Service: Endoscopy;  Laterality: N/A;  . IM NAILING TIBIA Right 04/16/2017  . MOUTH SURGERY    . MULTIPLE EXTRACTIONS WITH ALVEOLOPLASTY  03/29/2012   Procedure: MULTIPLE EXTRACION WITH ALVEOLOPLASTY;  Surgeon: Gae Bon, DDS;  Location: [facility];  Service: Oral Surgery;  Laterality: Bilateral;  . TIBIA IM NAIL INSERTION Right 04/16/2017   Procedure: INTRAMEDULLARY (IM) NAIL TIBIAL;  Surgeon: Leandrew Koyanagi, MD;  Location: [facility];   Service: Orthopedics;  Laterality: Right;   Social History   Occupational History  . Occupation: disabled  Tobacco Use  . Smoking status: Current Every Day Smoker    Packs/day: 1.00    Years: 30.00    Pack years: 30.00    Types: Cigarettes  . Smokeless tobacco: Current User  Substance and Sexual Activity  . Alcohol use: Yes    Comment: says quit May 2016  . Drug use: Yes    Types: Cocaine    Comment: patient smoked crack cocaine today  . Sexual activity: Yes    Comment: no birth control

## 2018-04-02 DIAGNOSIS — Z1159 Encounter for screening for other viral diseases: Secondary | ICD-10-CM | POA: Diagnosis not present

## 2018-04-02 DIAGNOSIS — Z136 Encounter for screening for cardiovascular disorders: Secondary | ICD-10-CM | POA: Diagnosis not present

## 2018-04-02 DIAGNOSIS — Z125 Encounter for screening for malignant neoplasm of prostate: Secondary | ICD-10-CM | POA: Diagnosis not present

## 2018-04-02 DIAGNOSIS — Z0110 Encounter for examination of ears and hearing without abnormal findings: Secondary | ICD-10-CM | POA: Diagnosis not present

## 2018-04-02 DIAGNOSIS — I10 Essential (primary) hypertension: Secondary | ICD-10-CM | POA: Diagnosis not present

## 2018-04-02 DIAGNOSIS — Z0101 Encounter for examination of eyes and vision with abnormal findings: Secondary | ICD-10-CM | POA: Diagnosis not present

## 2018-04-02 DIAGNOSIS — Z23 Encounter for immunization: Secondary | ICD-10-CM | POA: Diagnosis not present

## 2018-04-02 DIAGNOSIS — R030 Elevated blood-pressure reading, without diagnosis of hypertension: Secondary | ICD-10-CM | POA: Diagnosis not present

## 2018-04-02 DIAGNOSIS — Z131 Encounter for screening for diabetes mellitus: Secondary | ICD-10-CM | POA: Diagnosis not present

## 2018-04-02 DIAGNOSIS — Z720 Tobacco use: Secondary | ICD-10-CM | POA: Diagnosis not present

## 2018-04-02 DIAGNOSIS — Z5181 Encounter for therapeutic drug level monitoring: Secondary | ICD-10-CM | POA: Diagnosis not present

## 2018-04-02 DIAGNOSIS — Z8673 Personal history of transient ischemic attack (TIA), and cerebral infarction without residual deficits: Secondary | ICD-10-CM | POA: Diagnosis not present

## 2018-04-02 DIAGNOSIS — F1011 Alcohol abuse, in remission: Secondary | ICD-10-CM | POA: Diagnosis not present

## 2018-04-13 ENCOUNTER — Encounter: Payer: Self-pay | Admitting: Neurology

## 2018-06-09 NOTE — Progress Notes (Deleted)
NEUROLOGY CONSULTATION NOTE  Peter [name] MRN: [id number] DOB: 1951/01/13  Referring provider: Benito Mccreedy, MD Primary care provider: Benito Mccreedy, MD  Reason for consult:  History of strokes  HISTORY OF PRESENT ILLNESS: Peter [name] is a 67 year old ***-handed male with hypertension, type 2 diabetes, COPD, emphysema, cirrhosis of liver due to chronic alcohol abuse, tobacco abuse, and history of polysubstance abuse (crack, cocaine), and medication noncomplaince who presents for history of multiple strokes.  ***  ***.  He has been in the hospital multiple times over the past several years, such as for trauma due to multiple falls in setting of dizziness or syncope, for alcoholism, cirrhosis, and sepsis.  As far back as 2013 on Epic, there is no specific hospitalization for stroke.  He has history of medication non-compliance and polysubstance abuse (crack and cocaine).  He smokes ***.  Most recent imaging of the brain available is a CT of the head from 04/16/17, which demonstrated mild cortical and cerebellar atrophy, chronic lacunar infarct in the right thalamus.  Most recent echocardiogram from 11/19/14 showed EF 50-55% with grade 1 diastolic dysfunction.  Most recent carotid doppler from 01/14/13 showed no hemodynamically significant ICA stenosis.  Recent labs from 04/02/18 demonstrated LDL 118, Hgb A1c 5.5 and negative urine drug screen.  PAST MEDICAL HISTORY: Past Medical History:  Diagnosis Date  . Anemia    iron def.  . Anxiety   . Arthritis   . Bone spur of other site    Left Foot  . Cataract   . Chronic pain   . Cirrhosis of liver ([place])   . COPD (chronic obstructive pulmonary disease) ([place])   . Dental injury   . Depression   . Diabetes mellitus without complication ([place])   . Elevated LFTs   . Emphysema   . Emphysema   . Emphysema of lung ([place])   . ETOH abuse   . Fall   . Glaucoma   . Hard of hearing   . Hyperlipemia   . Hypertension   . Impaired mobility    . Pneumonia   . Sinusitis   . Stroke ([place])   . Total self care deficit     PAST SURGICAL HISTORY: Past Surgical History:  Procedure Laterality Date  . bone spur  2013   left spur  . CATARACT EXTRACTION W/ INTRAOCULAR LENS IMPLANT    . COLONOSCOPY    . ESOPHAGOGASTRODUODENOSCOPY  01/09/2012   Procedure: ESOPHAGOGASTRODUODENOSCOPY (EGD);  Surgeon: Beryle Beams, MD;  [facility] ENDOSCOPY;  Service: Endoscopy;  Laterality: N/A;  . IM NAILING TIBIA Right 04/16/2017  . MOUTH SURGERY    . MULTIPLE EXTRACTIONS WITH ALVEOLOPLASTY  03/29/2012   Procedure: MULTIPLE EXTRACION WITH ALVEOLOPLASTY;  Surgeon: Gae Bon, DDS;  Location: [facility];  Service: Oral Surgery;  Laterality: Bilateral;  . TIBIA IM NAIL INSERTION Right 04/16/2017   Procedure: INTRAMEDULLARY (IM) NAIL TIBIAL;  Surgeon: Leandrew Koyanagi, MD;  Location: [facility];  Service: Orthopedics;  Laterality: Right;    MEDICATIONS: Current Outpatient Medications on File Prior to Visit  Medication Sig Dispense Refill  . acetaminophen (TYLENOL) 325 MG tablet Take 2 tablets (650 mg total) by mouth every 6 (six) hours as needed for mild pain (or Fever >/= 101). 30 tablet 0  . apixaban (ELIQUIS) 2.5 MG TABS tablet Take 1 tablet (2.5 mg total) by mouth 2 (two) times daily. 28 tablet 0  . atorvastatin (LIPITOR) 40 MG tablet Take 1 tablet (40 mg total) by  mouth daily at 6 PM. 30 tablet 0  . capsaicin (ZOSTRIX) 0.025 % cream Apply topically 2 (two) times daily. 60 g 0  . ferrous sulfate 325 (65 FE) MG tablet Take 1 tablet (325 mg total) by mouth daily with breakfast. 30 tablet 0  . folic acid (FOLVITE) 1 MG tablet Take 1 tablet (1 mg total) by mouth daily. 30 tablet 0  . Multiple Vitamin (MULTIVITAMIN WITH MINERALS) TABS tablet Take 1 tablet by mouth daily.    Marland Kitchen oxyCODONE (OXY IR/ROXICODONE) 5 MG immediate release tablet Take 1 tablet (5 mg total) by mouth every 6 (six) hours as needed for breakthrough pain. (Patient not taking: Reported on  07/09/2017) 20 tablet 0   No current facility-administered medications on file prior to visit.     ALLERGIES: Allergies  Allergen Reactions  . Aspirin     Unknown- MD said do not take this medication  . Poison Ivy Extract [Poison Ivy Extract] Rash    FAMILY HISTORY: Family History  Problem Relation Age of Onset  . Breast cancer Mother   . Diabetes Maternal Aunt   . Heart disease Sister   . Heart disease Maternal Aunt    SOCIAL HISTORY: Social History   Socioeconomic History  . Marital status: Legally Separated    Spouse name: Not on file  . Number of children: 1  . Years of education: Not on file  . Highest education level: Not on file  Occupational History  . Occupation: disabled  Social Needs  . Financial resource strain: Not on file  . Food insecurity:    Worry: Not on file    Inability: Not on file  . Transportation needs:    Medical: Not on file    Non-medical: Not on file  Tobacco Use  . Smoking status: Current Every Day Smoker    Packs/day: 1.00    Years: 30.00    Pack years: 30.00    Types: Cigarettes  . Smokeless tobacco: Current User  Substance and Sexual Activity  . Alcohol use: Yes    Comment: says quit May 2016  . Drug use: Yes    Types: Cocaine    Comment: patient smoked crack cocaine today  . Sexual activity: Yes    Comment: no birth control  Lifestyle  . Physical activity:    Days per week: Not on file    Minutes per session: Not on file  . Stress: Not on file  Relationships  . Social connections:    Talks on phone: Not on file    Gets together: Not on file    Attends religious service: Not on file    Active member of club or organization: Not on file    Attends meetings of clubs or organizations: Not on file    Relationship status: Not on file  . Intimate partner violence:    Fear of current or ex partner: Not on file    Emotionally abused: Not on file    Physically abused: Not on file    Forced sexual activity: Not on file    Other Topics Concern  . Not on file  Social History Narrative  . Not on file    REVIEW OF SYSTEMS: Constitutional: No fevers, chills, or sweats, no generalized fatigue, change in appetite Eyes: No visual changes, double vision, eye pain Ear, nose and throat: No hearing loss, ear pain, nasal congestion, sore throat Cardiovascular: No chest pain, palpitations Respiratory:  No shortness of breath at rest or with  exertion, wheezes GastrointestinaI: No nausea, vomiting, diarrhea, abdominal pain, fecal incontinence Genitourinary:  No dysuria, urinary retention or frequency Musculoskeletal:  No neck pain, back pain Integumentary: No rash, pruritus, skin lesions Neurological: as above Psychiatric: No depression, insomnia, anxiety Endocrine: No palpitations, fatigue, diaphoresis, mood swings, change in appetite, change in weight, increased thirst Hematologic/Lymphatic:  No purpura, petechiae. Allergic/Immunologic: no itchy/runny eyes, nasal congestion, recent allergic reactions, rashes  PHYSICAL EXAM: *** General: No acute distress.  Patient appears ***-groomed.  *** Head:  Normocephalic/atraumatic Eyes:  fundi examined but not visualized Neck: supple, no paraspinal tenderness, full range of motion Back: No paraspinal tenderness Heart: regular rate and rhythm Lungs: Clear to auscultation bilaterally. Vascular: No carotid bruits. Neurological Exam: Mental status: alert and oriented to person, place, and time, recent and remote memory intact, fund of knowledge intact, attention and concentration intact, speech fluent and not dysarthric, language intact. Cranial nerves: CN I: not tested CN II: pupils equal, round and reactive to light, visual fields intact CN III, IV, VI:  full range of motion, no nystagmus, no ptosis CN V: facial sensation intact CN VII: upper and lower face symmetric CN VIII: hearing intact CN IX, X: gag intact, uvula midline CN XI: sternocleidomastoid and trapezius  muscles intact CN XII: tongue midline Bulk & Tone: normal, no fasciculations. Motor:  5/5 throughout *** Sensation:  Pinprick *** temperature *** and vibration sensation intact.  ***. Deep Tendon Reflexes:  2+ throughout, *** toes downgoing.  *** Finger to nose testing:  Without dysmetria.  *** Heel to shin:  Without dysmetria.  *** Gait:  Normal station and stride.  Able to turn and tandem walk. [place] ***.  IMPRESSION: ***  PLAN: ***  Thank you for allowing me to take part in the care of this patient.  Metta Clines, DO  CC: Benito Mccreedy, MD

## 2018-06-10 ENCOUNTER — Ambulatory Visit: Payer: Self-pay | Admitting: Neurology

## 2018-06-10 ENCOUNTER — Encounter

## 2018-06-10 ENCOUNTER — Encounter: Payer: Self-pay | Admitting: Neurology

## 2018-06-10 DIAGNOSIS — Z029 Encounter for administrative examinations, unspecified: Secondary | ICD-10-CM

## 2018-07-14 ENCOUNTER — Other Ambulatory Visit: Payer: Self-pay | Admitting: Internal Medicine

## 2018-07-14 ENCOUNTER — Encounter: Payer: Self-pay | Admitting: Family

## 2018-07-14 ENCOUNTER — Other Ambulatory Visit: Payer: Self-pay | Admitting: Family

## 2018-07-14 DIAGNOSIS — R011 Cardiac murmur, unspecified: Secondary | ICD-10-CM

## 2018-07-22 ENCOUNTER — Ambulatory Visit (HOSPITAL_COMMUNITY): Payer: Medicare HMO | Attending: Cardiovascular Disease

## 2018-07-22 ENCOUNTER — Other Ambulatory Visit: Payer: Self-pay

## 2018-07-22 DIAGNOSIS — R011 Cardiac murmur, unspecified: Secondary | ICD-10-CM | POA: Diagnosis not present

## 2018-10-04 ENCOUNTER — Encounter (HOSPITAL_COMMUNITY): Payer: Self-pay

## 2018-10-04 ENCOUNTER — Other Ambulatory Visit: Payer: Self-pay

## 2018-10-04 ENCOUNTER — Emergency Department (HOSPITAL_COMMUNITY)
Admission: EM | Admit: 2018-10-04 | Discharge: 2018-10-04 | Disposition: A | Discharge status: Home / Self Care | Payer: Medicare HMO | Attending: Emergency Medicine | Admitting: Emergency Medicine

## 2018-10-04 DIAGNOSIS — I10 Essential (primary) hypertension: Secondary | ICD-10-CM | POA: Insufficient documentation

## 2018-10-04 DIAGNOSIS — Z79899 Other long term (current) drug therapy: Secondary | ICD-10-CM | POA: Diagnosis not present

## 2018-10-04 DIAGNOSIS — R1110 Vomiting, unspecified: Secondary | ICD-10-CM | POA: Insufficient documentation

## 2018-10-04 DIAGNOSIS — J449 Chronic obstructive pulmonary disease, unspecified: Secondary | ICD-10-CM | POA: Insufficient documentation

## 2018-10-04 DIAGNOSIS — F17210 Nicotine dependence, cigarettes, uncomplicated: Secondary | ICD-10-CM | POA: Diagnosis not present

## 2018-10-04 DIAGNOSIS — E119 Type 2 diabetes mellitus without complications: Secondary | ICD-10-CM | POA: Insufficient documentation

## 2018-10-04 DIAGNOSIS — R400 Somnolence: Secondary | ICD-10-CM

## 2018-10-04 NOTE — Discharge Instructions (Signed)
Please return at any time you wish to be evaluated for the symptoms.  Call your doctor tomorrow and let them know that you came to the ED and see when they want to see you in the office.

## 2018-10-04 NOTE — ED Notes (Signed)
Pt left ama after speaking wth MD. No s/sx resp or acute distress noted

## 2018-10-04 NOTE — ED Triage Notes (Signed)
Per EMS pt drank half a beer and went to sleep. Pt woke up and had emesis x1. Family called 911. Pt denies emesis and is unsure why he is here. During triage pt unable to stay awake. Endorses occasional crack cocaine use but none today

## 2018-10-05 NOTE — ED Provider Notes (Signed)
[facility] EMERGENCY DEPARTMENT Provider Note   CSN: [id number] Arrival date & time: 10/04/18  1606    History   Chief Complaint Chief Complaint  Patient presents with  . Emesis    HPI CORTLAN DOLIN is a 68 y.o. male.     68 yo M with a chief complaint of excessive sleepiness and one episode of emesis.  This was noted by the patient's roommate and 911 was called and he was sent here.  Upon arrival the patient denies any symptoms and states he would just like to go home.  States he did not vomit today denies chest pain shortness of breath abdominal pain fevers cough or congestion.  He denies recent head injury denies excessive sleepiness though he does fall asleep a couple times during my history.  He states that he had a bit of alcohol to drink earlier today, repeated that he is fine and would just like to go home.  The history is provided by the patient.  Emesis  Associated symptoms: no abdominal pain, no arthralgias, no chills, no diarrhea, no fever, no headaches and no myalgias   Illness  Severity:  Mild Onset quality:  Sudden Duration:  1 day Timing:  Constant Progression:  Unchanged Chronicity:  New Associated symptoms: vomiting   Associated symptoms: no abdominal pain, no chest pain, no congestion, no diarrhea, no fever, no headaches, no myalgias, no rash and no shortness of breath     Past Medical History:  Diagnosis Date  . Anemia    iron def.  . Anxiety   . Arthritis   . Bone spur of other site    Left Foot  . Cataract   . Chronic pain   . Cirrhosis of liver ([place])   . COPD (chronic obstructive pulmonary disease) ([place])   . Dental injury   . Depression   . Diabetes mellitus without complication ([place])   . Elevated LFTs   . Emphysema   . Emphysema   . Emphysema of lung ([place])   . ETOH abuse   . Fall   . Glaucoma   . Hard of hearing   . Hyperlipemia   . Hypertension   . Impaired mobility   . Pneumonia   . Sinusitis   . Stroke  ([place])   . Total self care deficit     Patient Active Problem List   Diagnosis Date Noted  . Pain in right leg 09/25/2017  . Aortic atherosclerosis ([place]) 04/21/2017  . Iron deficiency anemia 04/21/2017  . Nodule of upper lobe of right lung 04/21/2017  . Closed fracture of right fibula and tibia   . Displaced fracture of fifth metatarsal bone, right foot, initial encounter for closed fracture   . Postoperative fever 04/19/2017  . Displaced oblique fracture of shaft of right tibia, initial encounter for closed fracture 04/16/2017  . Closed fracture of lateral portion of right tibial plateau 04/16/2017  . Tibial fracture 04/16/2017  . Hypotension, postural 01/05/2015  . UTI (lower urinary tract infection) 01/05/2015  . Acute renal failure ([place]) 01/05/2015  . ARF (acute renal failure) ([place]) 01/05/2015  . Closed nondisplaced fracture of greater trochanter of right femur with routine healing 11/23/2014  . Alcohol withdrawal ([place]) 11/21/2014  . Acute confusional state 11/21/2014  . Sepsis ([place]) 05/25/2014  . Fever 05/25/2014  . Malnutrition of moderate degree ([place]) 05/25/2014  . Alcohol abuse   . Smoker 12/15/2013  . Acute respiratory failure ([place]) 10/28/2013  . Delirium tremens ([place])  10/28/2013  . Acute respiratory distress syndrome (ARDS) ([place]) 10/28/2013  . Systolic CHF ([place]) 16/04/9603  . Tobacco abuse 10/27/2013  . HCAP (healthcare-associated pneumonia) 10/25/2013  . Weakness 08/01/2013  . Dizziness 04/06/2013  . Chronic alcohol abuse 02/16/2013  . Liver cirrhosis, alcoholic ([place]) 54/03/8118  . Depression (emotion) 02/16/2013  . COPD, moderate ([place]) 02/16/2013  . Aspiration pneumonia ([place]) 01/16/2013  . UTI (urinary tract infection) 01/16/2013  . Hypokalemia 01/15/2013  . Hypomagnesemia 01/15/2013  . Fever, unspecified 01/15/2013  . Leukopenia 01/13/2013  . Orthostatic hypotension 01/13/2013  . Alcohol intoxication ([place]) 01/12/2013  . Syncope 01/12/2013  . Lactic  acidosis 01/12/2013  . Cirrhosis ([place]) 01/12/2013  . History of stroke 01/12/2013  . Acute alcohol intoxication ([place]) 08/30/2012  . Iron deficiency 02/04/2012  . Hyponatremia 01/31/2012  . COPD (chronic obstructive pulmonary disease) ([place]) 01/03/2012    Past Surgical History:  Procedure Laterality Date  . bone spur  2013   left spur  . CATARACT EXTRACTION W/ INTRAOCULAR LENS IMPLANT    . COLONOSCOPY    . ESOPHAGOGASTRODUODENOSCOPY  01/09/2012   Procedure: ESOPHAGOGASTRODUODENOSCOPY (EGD);  Surgeon: Beryle Beams, MD;  Location[facility] ENDOSCOPY;  Service: Endoscopy;  Laterality: N/A;  . IM NAILING TIBIA Right 04/16/2017  . MOUTH SURGERY    . MULTIPLE EXTRACTIONS WITH ALVEOLOPLASTY  03/29/2012   Procedure: MULTIPLE EXTRACION WITH ALVEOLOPLASTY;  Surgeon: Gae Bon, DDS;  Location: [facility];  Service: Oral Surgery;  Laterality: Bilateral;  . TIBIA IM NAIL INSERTION Right 04/16/2017   Procedure: INTRAMEDULLARY (IM) NAIL TIBIAL;  Surgeon: Leandrew Koyanagi, MD;  Location: [facility];  Service: Orthopedics;  Laterality: Right;        Home Medications    Prior to Admission medications   Medication Sig Start Date End Date Taking? Authorizing Provider  acetaminophen (TYLENOL) 325 MG tablet Take 2 tablets (650 mg total) by mouth every 6 (six) hours as needed for mild pain (or Fever >/= 101). 01/08/15   Robbie Lis, MD  apixaban (ELIQUIS) 2.5 MG TABS tablet Take 1 tablet (2.5 mg total) by mouth 2 (two) times daily. 04/21/17 05/05/17  Melanee Spry, MD  atorvastatin (LIPITOR) 40 MG tablet Take 1 tablet (40 mg total) by mouth daily at 6 PM. 04/21/17   Lacroce, Hulen Shouts, MD  capsaicin (ZOSTRIX) 0.025 % cream Apply topically 2 (two) times daily. 04/21/17   Melanee Spry, MD  ferrous sulfate 325 (65 FE) MG tablet Take 1 tablet (325 mg total) by mouth daily with breakfast. 04/21/17   Lacroce, Hulen Shouts, MD  folic acid (FOLVITE) 1 MG tablet Take 1 tablet (1 mg total) by mouth daily. 04/21/17    Melanee Spry, MD  Multiple Vitamin (MULTIVITAMIN WITH MINERALS) TABS tablet Take 1 tablet by mouth daily.    [provider]  oxyCODONE (OXY IR/ROXICODONE) 5 MG immediate release tablet Take 1 tablet (5 mg total) by mouth every 6 (six) hours as needed for breakthrough pain. Patient not taking: Reported on 07/09/2017 04/21/17   Melanee Spry, MD    Family History Family History  Problem Relation Age of Onset  . Breast cancer Mother   . Diabetes Maternal Aunt   . Heart disease Sister   . Heart disease Maternal Aunt     Social History Social History   Tobacco Use  . Smoking status: Current Every Day Smoker    Packs/day: 1.00    Years: 30.00    Pack years: 30.00    Types:  Cigarettes  . Smokeless tobacco: Current User  Substance Use Topics  . Alcohol use: Yes    Comment: says quit May 2016  . Drug use: Yes    Types: Cocaine    Comment: patient smoked crack cocaine today     Allergies   Aspirin and Poison ivy extract [poison ivy extract]   Review of Systems Review of Systems  Constitutional: Negative for chills and fever.  HENT: Negative for congestion and facial swelling.   Eyes: Negative for discharge and visual disturbance.  Respiratory: Negative for shortness of breath.   Cardiovascular: Negative for chest pain and palpitations.  Gastrointestinal: Positive for vomiting. Negative for abdominal pain and diarrhea.  Musculoskeletal: Negative for arthralgias and myalgias.  Skin: Negative for color change and rash.  Neurological: Negative for tremors, syncope and headaches.  Psychiatric/Behavioral: Negative for confusion and dysphoric mood.     Physical Exam Updated Vital Signs BP 124/75 (BP Location: Right Arm)   Pulse (!) 110   Temp 98.5 F (36.9 C) (Oral)   Resp 16   Ht 5\' 6"  (1.676 m)   Wt 61.7 kg   SpO2 96%   BMI 21.95 kg/m   Physical Exam Vitals signs and nursing note reviewed.  Constitutional:      Appearance: He is  well-developed.     Comments: Falls asleep easily.  Awakens to voice  HENT:     Head: Normocephalic and atraumatic.  Eyes:     Pupils: Pupils are equal, round, and reactive to light.  Neck:     Musculoskeletal: Normal range of motion and neck supple.     Vascular: No JVD.  Cardiovascular:     Rate and Rhythm: Regular rhythm. Tachycardia present.     Heart sounds: No murmur. No friction rub. No gallop.   Pulmonary:     Effort: No respiratory distress.     Breath sounds: No wheezing.  Abdominal:     General: There is no distension.     Tenderness: There is no guarding or rebound.  Musculoskeletal: Normal range of motion.  Skin:    Coloration: Skin is not pale.     Findings: No rash.  Neurological:     Mental Status: He is alert and oriented to person, place, and time.  Psychiatric:        Behavior: Behavior normal.      ED Treatments / Results  Labs (all labs ordered are listed, but only abnormal results are displayed) Labs Reviewed - No data to display  EKG None  Radiology No results found.  Procedures Procedures (including critical care time)  Medications Ordered in ED Medications - No data to display   Initial Impression / Assessment and Plan / ED Course  I have reviewed the triage vital signs and the nursing notes.  Pertinent labs & imaging results that were available during my care of the patient were reviewed by me and considered in my medical decision making (see chart for details).        68 yo M with a chief complaint of excessive sleepiness and one episode of vomiting.  The patient denies the vomiting.  States that he laid down for a nap in his roommate woke him up and states that he is just tired because he wants to go back to sleep.  He denies any other symptoms and is refusing any work-up to be done in the ED.  He did fall asleep multiple times today and I offered different evaluations with him and  offered to observe him in the emergency department.   He is declining any therapy and would like to go home at this point.  He is awake and alert and unable to discuss his day with me.  He feels that he is just tired and would like to go home and go to sleep.  I have heard for him to return at any time.  He is tachycardic I will sign him out [place].  11:11 AM:  I have discussed the diagnosis/risks/treatment options with the patient and believe the pt to be eligible for discharge home to follow-up with PCP. We also discussed returning to the ED immediately if new or worsening sx occur. We discussed the sx which are most concerning (e.g., sudden worsening pain, fever, inability to tolerate by mouth) that necessitate immediate return. Medications administered to the patient during their visit and any new prescriptions provided to the patient are listed below.  Medications given during this visit Medications - No data to display   The patient appears reasonably screen and/or stabilized for discharge and I doubt any other medical condition or other [facility] requiring further screening, evaluation, or treatment in the ED at this time prior to discharge.    Final Clinical Impressions(s) / ED Diagnoses   Final diagnoses:  Vomiting in adult  Sleepiness    ED Discharge Orders    None       Deno Etienne, DO 10/05/18 1112

## 2019-01-02 ENCOUNTER — Emergency Department (HOSPITAL_COMMUNITY): Payer: Medicare Other

## 2019-01-02 ENCOUNTER — Inpatient Hospital Stay (HOSPITAL_COMMUNITY)
Admission: EM | Admit: 2019-01-02 | Discharge: 2019-01-03 | DRG: 511 | Disposition: A | Discharge status: Home / Self Care | Payer: Medicare Other | Attending: Orthopaedic Surgery | Admitting: Orthopaedic Surgery

## 2019-01-02 ENCOUNTER — Encounter (HOSPITAL_COMMUNITY): Payer: Self-pay | Admitting: Medical

## 2019-01-02 DIAGNOSIS — S52031A Displaced fracture of olecranon process with intraarticular extension of right ulna, initial encounter for closed fracture: Principal | ICD-10-CM | POA: Diagnosis present

## 2019-01-02 DIAGNOSIS — F17200 Nicotine dependence, unspecified, uncomplicated: Secondary | ICD-10-CM

## 2019-01-02 DIAGNOSIS — Z23 Encounter for immunization: Secondary | ICD-10-CM | POA: Diagnosis not present

## 2019-01-02 DIAGNOSIS — I69354 Hemiplegia and hemiparesis following cerebral infarction affecting left non-dominant side: Secondary | ICD-10-CM | POA: Diagnosis not present

## 2019-01-02 DIAGNOSIS — J449 Chronic obstructive pulmonary disease, unspecified: Secondary | ICD-10-CM

## 2019-01-02 DIAGNOSIS — S2231XA Fracture of one rib, right side, initial encounter for closed fracture: Secondary | ICD-10-CM

## 2019-01-02 DIAGNOSIS — Z886 Allergy status to analgesic agent status: Secondary | ICD-10-CM | POA: Diagnosis not present

## 2019-01-02 DIAGNOSIS — D509 Iron deficiency anemia, unspecified: Secondary | ICD-10-CM | POA: Diagnosis present

## 2019-01-02 DIAGNOSIS — M1990 Unspecified osteoarthritis, unspecified site: Secondary | ICD-10-CM | POA: Diagnosis not present

## 2019-01-02 DIAGNOSIS — Z79899 Other long term (current) drug therapy: Secondary | ICD-10-CM

## 2019-01-02 DIAGNOSIS — I10 Essential (primary) hypertension: Secondary | ICD-10-CM | POA: Diagnosis present

## 2019-01-02 DIAGNOSIS — F1410 Cocaine abuse, uncomplicated: Secondary | ICD-10-CM

## 2019-01-02 DIAGNOSIS — Y900 Blood alcohol level of less than 20 mg/100 ml: Secondary | ICD-10-CM | POA: Diagnosis present

## 2019-01-02 DIAGNOSIS — J439 Emphysema, unspecified: Secondary | ICD-10-CM | POA: Diagnosis not present

## 2019-01-02 DIAGNOSIS — Z7901 Long term (current) use of anticoagulants: Secondary | ICD-10-CM | POA: Diagnosis not present

## 2019-01-02 DIAGNOSIS — E119 Type 2 diabetes mellitus without complications: Secondary | ICD-10-CM | POA: Diagnosis not present

## 2019-01-02 DIAGNOSIS — S52021A Displaced fracture of olecranon process without intraarticular extension of right ulna, initial encounter for closed fracture: Secondary | ICD-10-CM | POA: Diagnosis not present

## 2019-01-02 DIAGNOSIS — E785 Hyperlipidemia, unspecified: Secondary | ICD-10-CM | POA: Diagnosis present

## 2019-01-02 DIAGNOSIS — S2241XA Multiple fractures of ribs, right side, initial encounter for closed fracture: Secondary | ICD-10-CM | POA: Diagnosis present

## 2019-01-02 DIAGNOSIS — Z1159 Encounter for screening for other viral diseases: Secondary | ICD-10-CM | POA: Diagnosis not present

## 2019-01-02 DIAGNOSIS — K7030 Alcoholic cirrhosis of liver without ascites: Secondary | ICD-10-CM | POA: Diagnosis not present

## 2019-01-02 DIAGNOSIS — H409 Unspecified glaucoma: Secondary | ICD-10-CM | POA: Diagnosis present

## 2019-01-02 DIAGNOSIS — E7800 Pure hypercholesterolemia, unspecified: Secondary | ICD-10-CM | POA: Diagnosis not present

## 2019-01-02 DIAGNOSIS — G8929 Other chronic pain: Secondary | ICD-10-CM | POA: Diagnosis present

## 2019-01-02 DIAGNOSIS — I639 Cerebral infarction, unspecified: Secondary | ICD-10-CM

## 2019-01-02 DIAGNOSIS — H9190 Unspecified hearing loss, unspecified ear: Secondary | ICD-10-CM | POA: Diagnosis present

## 2019-01-02 DIAGNOSIS — F17210 Nicotine dependence, cigarettes, uncomplicated: Secondary | ICD-10-CM | POA: Diagnosis not present

## 2019-01-02 DIAGNOSIS — S0181XA Laceration without foreign body of other part of head, initial encounter: Secondary | ICD-10-CM | POA: Diagnosis present

## 2019-01-02 DIAGNOSIS — F10129 Alcohol abuse with intoxication, unspecified: Secondary | ICD-10-CM | POA: Diagnosis present

## 2019-01-02 LAB — CBC WITH DIFFERENTIAL/PLATELET
Abs Immature Granulocytes: 0.02 10*3/uL (ref 0.00–0.07)
Basophils Absolute: 0 10*3/uL (ref 0.0–0.1)
Basophils Relative: 0 %
Eosinophils Absolute: 0 10*3/uL (ref 0.0–0.5)
Eosinophils Relative: 0 %
HCT: 42.6 % (ref 39.0–52.0)
Hemoglobin: 13.6 g/dL (ref 13.0–17.0)
Immature Granulocytes: 0 %
Lymphocytes Relative: 16 %
Lymphs Abs: 1.4 10*3/uL (ref 0.7–4.0)
MCH: 24.8 pg — ABNORMAL LOW (ref 26.0–34.0)
MCHC: 31.9 g/dL (ref 30.0–36.0)
MCV: 77.7 fL — ABNORMAL LOW (ref 80.0–100.0)
Monocytes Absolute: 0.6 10*3/uL (ref 0.1–1.0)
Monocytes Relative: 6 %
Neutro Abs: 6.8 10*3/uL (ref 1.7–7.7)
Neutrophils Relative %: 78 %
Platelets: 314 10*3/uL (ref 150–400)
RBC: 5.48 MIL/uL (ref 4.22–5.81)
RDW: 17.1 % — ABNORMAL HIGH (ref 11.5–15.5)
WBC: 8.8 10*3/uL (ref 4.0–10.5)
nRBC: 0 % (ref 0.0–0.2)

## 2019-01-02 LAB — HEMOGLOBIN A1C
Hgb A1c MFr Bld: 5.5 % (ref 4.8–5.6)
Mean Plasma Glucose: 111.15 mg/dL

## 2019-01-02 LAB — PROTIME-INR
INR: 1 (ref 0.8–1.2)
Prothrombin Time: 12.9 seconds (ref 11.4–15.2)

## 2019-01-02 LAB — BASIC METABOLIC PANEL
Anion gap: 12 (ref 5–15)
BUN: 11 mg/dL (ref 8–23)
CO2: 21 mmol/L — ABNORMAL LOW (ref 22–32)
Calcium: 9.4 mg/dL (ref 8.9–10.3)
Chloride: 102 mmol/L (ref 98–111)
Creatinine, Ser: 1.23 mg/dL (ref 0.61–1.24)
GFR calc Af Amer: >60 mL/min (ref >60)
GFR calc non Af Amer: >60 mL/min (ref >60)
Glucose, Bld: 77 mg/dL (ref 70–99)
Potassium: 4.2 mmol/L (ref 3.5–5.1)
Sodium: 135 mmol/L (ref 135–145)

## 2019-01-02 LAB — GLUCOSE, CAPILLARY
Glucose-Capillary: 79 mg/dL (ref 70–99)
Glucose-Capillary: 84 mg/dL (ref 70–99)

## 2019-01-02 LAB — SURGICAL PCR SCREEN
MRSA, PCR: NEGATIVE
Staphylococcus aureus: NEGATIVE

## 2019-01-02 LAB — SARS CORONAVIRUS 2 BY RT PCR (HOSPITAL ORDER, PERFORMED IN ~~LOC~~ HOSPITAL LAB): SARS Coronavirus 2: NEGATIVE

## 2019-01-02 LAB — ETHANOL: Alcohol, Ethyl (B): <10 mg/dL (ref <10)

## 2019-01-02 MED ORDER — POVIDONE-IODINE 10 % EX SWAB: 2.0000 "application " | Freq: Once | CUTANEOUS | Status: DC

## 2019-01-02 MED ORDER — LORAZEPAM 2 MG/ML IJ SOLN: 0.0000 mg | Freq: Four times a day (QID) | INTRAMUSCULAR | Status: DC

## 2019-01-02 MED ORDER — ONDANSETRON HCL 4 MG/2ML IJ SOLN: 4.0000 mg | Freq: Four times a day (QID) | INTRAMUSCULAR | Status: DC | PRN

## 2019-01-02 MED ORDER — ACETAMINOPHEN 500 MG PO TABS
500.0000 mg | ORAL_TABLET | Freq: Four times a day (QID) | ORAL | Status: DC
  Administered 2019-01-02: 500 mg via ORAL
  Filled 2019-01-02: qty 1

## 2019-01-02 MED ORDER — OXYCODONE HCL 5 MG PO TABS
5.0000 mg | ORAL_TABLET | Freq: Once | ORAL | Status: AC
  Administered 2019-01-02: 5 mg via ORAL
  Filled 2019-01-02: qty 1

## 2019-01-02 MED ORDER — AMLODIPINE BESYLATE 2.5 MG PO TABS
2.5000 mg | ORAL_TABLET | Freq: Every day | ORAL | Status: DC
  Administered 2019-01-02: 19:00:00 2.5 mg via ORAL
  Filled 2019-01-02: qty 1

## 2019-01-02 MED ORDER — SODIUM CHLORIDE 0.9 % IV SOLN
INTRAVENOUS | Status: DC
  Administered 2019-01-02 – 2019-01-03 (×2): via INTRAVENOUS

## 2019-01-02 MED ORDER — HYDROCODONE-ACETAMINOPHEN 5-325 MG PO TABS: 1.0000 | ORAL_TABLET | Freq: Once | ORAL | Status: DC

## 2019-01-02 MED ORDER — HYDRALAZINE HCL 20 MG/ML IJ SOLN: 10.0000 mg | Freq: Four times a day (QID) | INTRAMUSCULAR | Status: DC | PRN

## 2019-01-02 MED ORDER — INSULIN ASPART 100 UNIT/ML ~~LOC~~ SOLN: 0.0000 [IU] | Freq: Three times a day (TID) | SUBCUTANEOUS | Status: DC

## 2019-01-02 MED ORDER — NICOTINE 14 MG/24HR TD PT24
14.0000 mg | MEDICATED_PATCH | Freq: Every day | TRANSDERMAL | Status: DC
  Administered 2019-01-02: 14 mg via TRANSDERMAL
  Filled 2019-01-02: qty 1

## 2019-01-02 MED ORDER — FOLIC ACID 1 MG PO TABS: 1.0000 mg | ORAL_TABLET | Freq: Every day | ORAL | Status: DC

## 2019-01-02 MED ORDER — CHLORHEXIDINE GLUCONATE 4 % EX LIQD
60.0000 mL | Freq: Once | CUTANEOUS | Status: AC
  Administered 2019-01-03: 4 via TOPICAL

## 2019-01-02 MED ORDER — CEFAZOLIN SODIUM-DEXTROSE 2-4 GM/100ML-% IV SOLN
2.0000 g | INTRAVENOUS | Status: AC
  Administered 2019-01-03: 09:00:00 2 g via INTRAVENOUS
  Filled 2019-01-02: qty 100

## 2019-01-02 MED ORDER — ENSURE PRE-SURGERY PO LIQD
296.0000 mL | Freq: Once | ORAL | Status: AC
  Administered 2019-01-02: 296 mL via ORAL
  Filled 2019-01-02: qty 296

## 2019-01-02 MED ORDER — IOHEXOL 300 MG/ML  SOLN
100.0000 mL | Freq: Once | INTRAMUSCULAR | Status: AC | PRN
  Administered 2019-01-02: 13:00:00 100 mL via INTRAVENOUS

## 2019-01-02 MED ORDER — ONDANSETRON HCL 4 MG PO TABS: 4.0000 mg | ORAL_TABLET | Freq: Four times a day (QID) | ORAL | Status: DC | PRN

## 2019-01-02 MED ORDER — LORAZEPAM 2 MG/ML IJ SOLN: 0.0000 mg | Freq: Two times a day (BID) | INTRAMUSCULAR | Status: DC

## 2019-01-02 MED ORDER — TETANUS-DIPHTH-ACELL PERTUSSIS 5-2.5-18.5 LF-MCG/0.5 IM SUSP
0.5000 mL | Freq: Once | INTRAMUSCULAR | Status: AC
  Administered 2019-01-02: 0.5 mL via INTRAMUSCULAR
  Filled 2019-01-02: qty 0.5

## 2019-01-02 MED ORDER — LORAZEPAM 1 MG PO TABS: 0.0000 mg | ORAL_TABLET | Freq: Two times a day (BID) | ORAL | Status: DC

## 2019-01-02 MED ORDER — ADULT MULTIVITAMIN W/MINERALS CH: 1.0000 | ORAL_TABLET | Freq: Every day | ORAL | Status: DC

## 2019-01-02 MED ORDER — SODIUM CHLORIDE 0.9 % IV SOLN: INTRAVENOUS | Status: DC

## 2019-01-02 MED ORDER — LIDOCAINE-EPINEPHRINE (PF) 2 %-1:200000 IJ SOLN
20.0000 mL | Freq: Once | INTRAMUSCULAR | Status: AC
  Administered 2019-01-02: 20 mL via INTRADERMAL
  Filled 2019-01-02: qty 20

## 2019-01-02 MED ORDER — VITAMIN B-1 100 MG PO TABS
100.0000 mg | ORAL_TABLET | Freq: Every day | ORAL | Status: DC
  Administered 2019-01-02: 100 mg via ORAL
  Filled 2019-01-02: qty 1

## 2019-01-02 MED ORDER — DIPHENHYDRAMINE HCL 12.5 MG/5ML PO ELIX: 12.5000 mg | ORAL_SOLUTION | ORAL | Status: DC | PRN

## 2019-01-02 MED ORDER — HYDROCODONE-ACETAMINOPHEN 5-325 MG PO TABS: 1.0000 | ORAL_TABLET | Freq: Four times a day (QID) | ORAL | Status: DC | PRN

## 2019-01-02 MED ORDER — HYDROCODONE-ACETAMINOPHEN 5-325 MG PO TABS
1.0000 | ORAL_TABLET | Freq: Four times a day (QID) | ORAL | Status: DC | PRN
  Administered 2019-01-03: 1 via ORAL
  Filled 2019-01-02: qty 1

## 2019-01-02 MED ORDER — LORAZEPAM 1 MG PO TABS
0.0000 mg | ORAL_TABLET | Freq: Four times a day (QID) | ORAL | Status: DC
  Administered 2019-01-02: 1 mg via ORAL
  Filled 2019-01-02: qty 1

## 2019-01-02 NOTE — ED Notes (Signed)
[employer] at bedside filing report. Pt will go up asap.

## 2019-01-02 NOTE — ED Notes (Signed)
[employer] confirmed this pt has already filed a police report.

## 2019-01-02 NOTE — Plan of Care (Signed)
  Problem: Nutrition: Goal: Adequate nutrition will be maintained Outcome: Progressing   Problem: Coping: Goal: Level of anxiety will decrease Outcome: Progressing   Problem: Pain Managment: Goal: General experience of comfort will improve Outcome: Progressing   Problem: Safety: Goal: Ability to remain free from injury will improve Outcome: Progressing   

## 2019-01-02 NOTE — ED Notes (Signed)
Pt's family member Sherie sent along number with message "please call me." Pt gave this RN permission to update Sherrie on plan of care/tests. Sherrie did not answer when this RN attempted to call, will try again later.
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
## 2019-01-02 NOTE — ED Notes (Signed)
Patient transported to CT 

## 2019-01-02 NOTE — ED Notes (Signed)
Pt has been in contact with [facility]
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
## 2019-01-02 NOTE — ED Notes (Signed)
Ortho at bedside, CT afterwards.
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
## 2019-01-02 NOTE — ED Notes (Signed)
Called CT to let them know pt now has a line and is ready for scans.

## 2019-01-02 NOTE — Progress Notes (Signed)
Pt arrived to room. Clothing put in belongings bag. Call bell with in reach.

## 2019-01-02 NOTE — Progress Notes (Signed)
Orthopedic Tech Progress Note Patient Details:  Peter [name] Dec 01, 1950 [id number]  Ortho Devices Type of Ortho Device: Ace wrap, Arm sling, Post (long arm) splint  Right     [name] 01/02/2019, 11:37 AM

## 2019-01-02 NOTE — ED Notes (Signed)
Assited pt with calling Sherrie
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
## 2019-01-02 NOTE — ED Notes (Signed)
Mother- Daylene [phone], will pick up if discharged

## 2019-01-03 ENCOUNTER — Inpatient Hospital Stay (HOSPITAL_COMMUNITY): Payer: Medicare Other | Admitting: Certified Registered"

## 2019-01-03 ENCOUNTER — Encounter (HOSPITAL_COMMUNITY)
Admission: EM | Disposition: A | Discharge status: Home / Self Care | Payer: Self-pay | Source: Home / Self Care | Attending: Orthopedic Surgery

## 2019-01-03 ENCOUNTER — Encounter (HOSPITAL_COMMUNITY): Payer: Self-pay | Admitting: Orthopedic Surgery

## 2019-01-03 DIAGNOSIS — K7030 Alcoholic cirrhosis of liver without ascites: Secondary | ICD-10-CM | POA: Diagnosis not present

## 2019-01-03 DIAGNOSIS — Z79899 Other long term (current) drug therapy: Secondary | ICD-10-CM | POA: Diagnosis not present

## 2019-01-03 DIAGNOSIS — F10129 Alcohol abuse with intoxication, unspecified: Secondary | ICD-10-CM | POA: Diagnosis not present

## 2019-01-03 DIAGNOSIS — E119 Type 2 diabetes mellitus without complications: Secondary | ICD-10-CM | POA: Diagnosis not present

## 2019-01-03 DIAGNOSIS — S52031A Displaced fracture of olecranon process with intraarticular extension of right ulna, initial encounter for closed fracture: Secondary | ICD-10-CM | POA: Diagnosis not present

## 2019-01-03 DIAGNOSIS — I69354 Hemiplegia and hemiparesis following cerebral infarction affecting left non-dominant side: Secondary | ICD-10-CM | POA: Diagnosis not present

## 2019-01-03 DIAGNOSIS — I10 Essential (primary) hypertension: Secondary | ICD-10-CM | POA: Diagnosis not present

## 2019-01-03 DIAGNOSIS — S0181XA Laceration without foreign body of other part of head, initial encounter: Secondary | ICD-10-CM | POA: Diagnosis not present

## 2019-01-03 DIAGNOSIS — Z7901 Long term (current) use of anticoagulants: Secondary | ICD-10-CM | POA: Diagnosis not present

## 2019-01-03 DIAGNOSIS — E785 Hyperlipidemia, unspecified: Secondary | ICD-10-CM | POA: Diagnosis not present

## 2019-01-03 DIAGNOSIS — J439 Emphysema, unspecified: Secondary | ICD-10-CM | POA: Diagnosis not present

## 2019-01-03 DIAGNOSIS — Z886 Allergy status to analgesic agent status: Secondary | ICD-10-CM | POA: Diagnosis not present

## 2019-01-03 DIAGNOSIS — S2241XA Multiple fractures of ribs, right side, initial encounter for closed fracture: Secondary | ICD-10-CM | POA: Diagnosis not present

## 2019-01-03 DIAGNOSIS — H9190 Unspecified hearing loss, unspecified ear: Secondary | ICD-10-CM | POA: Diagnosis not present

## 2019-01-03 DIAGNOSIS — D509 Iron deficiency anemia, unspecified: Secondary | ICD-10-CM | POA: Diagnosis not present

## 2019-01-03 DIAGNOSIS — S52021A Displaced fracture of olecranon process without intraarticular extension of right ulna, initial encounter for closed fracture: Secondary | ICD-10-CM | POA: Diagnosis not present

## 2019-01-03 DIAGNOSIS — F17210 Nicotine dependence, cigarettes, uncomplicated: Secondary | ICD-10-CM | POA: Diagnosis not present

## 2019-01-03 DIAGNOSIS — G8929 Other chronic pain: Secondary | ICD-10-CM | POA: Diagnosis not present

## 2019-01-03 DIAGNOSIS — M1990 Unspecified osteoarthritis, unspecified site: Secondary | ICD-10-CM | POA: Diagnosis not present

## 2019-01-03 DIAGNOSIS — Z23 Encounter for immunization: Secondary | ICD-10-CM | POA: Diagnosis present

## 2019-01-03 DIAGNOSIS — Z1159 Encounter for screening for other viral diseases: Secondary | ICD-10-CM | POA: Diagnosis not present

## 2019-01-03 DIAGNOSIS — Y900 Blood alcohol level of less than 20 mg/100 ml: Secondary | ICD-10-CM | POA: Diagnosis not present

## 2019-01-03 DIAGNOSIS — H409 Unspecified glaucoma: Secondary | ICD-10-CM | POA: Diagnosis not present

## 2019-01-03 HISTORY — PX: ORIF ELBOW FRACTURE: SHX5031

## 2019-01-03 LAB — HIV ANTIBODY (ROUTINE TESTING W REFLEX): HIV Screen 4th Generation wRfx: NONREACTIVE

## 2019-01-03 LAB — GLUCOSE, CAPILLARY
Glucose-Capillary: 86 mg/dL (ref 70–99)
Glucose-Capillary: 141 mg/dL — ABNORMAL HIGH (ref 70–99)
Glucose-Capillary: 122 mg/dL — ABNORMAL HIGH (ref 70–99)

## 2019-01-03 SURGERY — OPEN REDUCTION INTERNAL FIXATION (ORIF) ELBOW/OLECRANON FRACTURE
Anesthesia: General | Laterality: Right

## 2019-01-03 MED ORDER — SODIUM CHLORIDE (PF) 0.9 % IJ SOLN
INTRAMUSCULAR | Status: AC
  Filled 2019-01-03: qty 20

## 2019-01-03 MED ORDER — SUCCINYLCHOLINE CHLORIDE 200 MG/10ML IV SOSY
PREFILLED_SYRINGE | INTRAVENOUS | Status: DC | PRN
  Administered 2019-01-03: 80 mg via INTRAVENOUS

## 2019-01-03 MED ORDER — ROCURONIUM BROMIDE 10 MG/ML (PF) SYRINGE
PREFILLED_SYRINGE | INTRAVENOUS | Status: DC | PRN
  Administered 2019-01-03: 40 mg via INTRAVENOUS
  Administered 2019-01-03: 20 mg via INTRAVENOUS

## 2019-01-03 MED ORDER — MIDAZOLAM HCL 2 MG/2ML IJ SOLN
INTRAMUSCULAR | Status: AC
  Filled 2019-01-03: qty 2

## 2019-01-03 MED ORDER — SUCCINYLCHOLINE CHLORIDE 200 MG/10ML IV SOSY
PREFILLED_SYRINGE | INTRAVENOUS | Status: AC
  Filled 2019-01-03: qty 20

## 2019-01-03 MED ORDER — LACTATED RINGERS IV SOLN
INTRAVENOUS | Status: DC | PRN
  Administered 2019-01-03: 09:00:00 via INTRAVENOUS

## 2019-01-03 MED ORDER — FENTANYL CITRATE (PF) 250 MCG/5ML IJ SOLN
INTRAMUSCULAR | Status: DC | PRN
  Administered 2019-01-03: 75 ug via INTRAVENOUS
  Administered 2019-01-03: 100 ug via INTRAVENOUS
  Administered 2019-01-03: 75 ug via INTRAVENOUS

## 2019-01-03 MED ORDER — ONDANSETRON HCL 4 MG/2ML IJ SOLN: 4.0000 mg | Freq: Once | INTRAMUSCULAR | Status: DC | PRN

## 2019-01-03 MED ORDER — ETOMIDATE 2 MG/ML IV SOLN
INTRAVENOUS | Status: AC
  Filled 2019-01-03: qty 10

## 2019-01-03 MED ORDER — FENTANYL CITRATE (PF) 250 MCG/5ML IJ SOLN
INTRAMUSCULAR | Status: AC
  Filled 2019-01-03: qty 5

## 2019-01-03 MED ORDER — LIDOCAINE 2% (20 MG/ML) 5 ML SYRINGE
INTRAMUSCULAR | Status: AC
  Filled 2019-01-03: qty 15

## 2019-01-03 MED ORDER — DEXAMETHASONE SODIUM PHOSPHATE 10 MG/ML IJ SOLN
INTRAMUSCULAR | Status: DC | PRN
  Administered 2019-01-03: 5 mg via INTRAVENOUS

## 2019-01-03 MED ORDER — LACTATED RINGERS IV SOLN: INTRAVENOUS | Status: DC

## 2019-01-03 MED ORDER — ONDANSETRON HCL 4 MG/2ML IJ SOLN
INTRAMUSCULAR | Status: AC
  Filled 2019-01-03: qty 8

## 2019-01-03 MED ORDER — HYDROCODONE-ACETAMINOPHEN 5-325 MG PO TABS: 1.0000 | ORAL_TABLET | ORAL | 0 refills | Status: DC | PRN

## 2019-01-03 MED ORDER — DEXAMETHASONE SODIUM PHOSPHATE 10 MG/ML IJ SOLN
INTRAMUSCULAR | Status: AC
  Filled 2019-01-03: qty 3

## 2019-01-03 MED ORDER — FENTANYL CITRATE (PF) 100 MCG/2ML IJ SOLN: 25.0000 ug | INTRAMUSCULAR | Status: DC | PRN

## 2019-01-03 MED ORDER — SODIUM CHLORIDE 0.9 % IV SOLN
INTRAVENOUS | Status: DC | PRN
  Administered 2019-01-03: 09:00:00 50 ug/min via INTRAVENOUS

## 2019-01-03 MED ORDER — EPHEDRINE 5 MG/ML INJ
INTRAVENOUS | Status: AC
  Filled 2019-01-03: qty 20

## 2019-01-03 MED ORDER — PHENYLEPHRINE HCL (PRESSORS) 10 MG/ML IV SOLN
INTRAVENOUS | Status: AC
  Filled 2019-01-03: qty 1

## 2019-01-03 MED ORDER — SUGAMMADEX SODIUM 200 MG/2ML IV SOLN
INTRAVENOUS | Status: DC | PRN
  Administered 2019-01-03: 140 mg via INTRAVENOUS

## 2019-01-03 MED ORDER — ROCURONIUM BROMIDE 10 MG/ML (PF) SYRINGE
PREFILLED_SYRINGE | INTRAVENOUS | Status: AC
  Filled 2019-01-03: qty 20

## 2019-01-03 MED ORDER — ONDANSETRON HCL 4 MG/2ML IJ SOLN
INTRAMUSCULAR | Status: DC | PRN
  Administered 2019-01-03: 4 mg via INTRAVENOUS

## 2019-01-03 MED ORDER — FENTANYL CITRATE (PF) 100 MCG/2ML IJ SOLN
INTRAMUSCULAR | Status: AC
  Filled 2019-01-03: qty 2

## 2019-01-03 MED ORDER — LABETALOL HCL 5 MG/ML IV SOLN
INTRAVENOUS | Status: DC | PRN
  Administered 2019-01-03: 5 mg via INTRAVENOUS

## 2019-01-03 MED ORDER — PROPOFOL 10 MG/ML IV BOLUS
INTRAVENOUS | Status: DC | PRN
  Administered 2019-01-03: 50 mg via INTRAVENOUS
  Administered 2019-01-03: 120 mg via INTRAVENOUS
  Administered 2019-01-03: 40 mg via INTRAVENOUS
  Administered 2019-01-03: 30 mg via INTRAVENOUS

## 2019-01-03 MED ORDER — LIDOCAINE 2% (20 MG/ML) 5 ML SYRINGE
INTRAMUSCULAR | Status: DC | PRN
  Administered 2019-01-03: 60 mg via INTRAVENOUS

## 2019-01-03 MED ORDER — PHENYLEPHRINE 40 MCG/ML (10ML) SYRINGE FOR IV PUSH (FOR BLOOD PRESSURE SUPPORT)
PREFILLED_SYRINGE | INTRAVENOUS | Status: DC | PRN
  Administered 2019-01-03: 80 ug via INTRAVENOUS
  Administered 2019-01-03: 200 ug via INTRAVENOUS
  Administered 2019-01-03: 240 ug via INTRAVENOUS
  Administered 2019-01-03: 120 ug via INTRAVENOUS

## 2019-01-03 MED ORDER — 0.9 % SODIUM CHLORIDE (POUR BTL) OPTIME
TOPICAL | Status: DC | PRN
  Administered 2019-01-03: 1000 mL

## 2019-01-03 MED ORDER — PHENYLEPHRINE 40 MCG/ML (10ML) SYRINGE FOR IV PUSH (FOR BLOOD PRESSURE SUPPORT)
PREFILLED_SYRINGE | INTRAVENOUS | Status: AC
  Filled 2019-01-03: qty 30

## 2019-01-03 MED ORDER — ESMOLOL HCL 100 MG/10ML IV SOLN
INTRAVENOUS | Status: DC | PRN
  Administered 2019-01-03: 30 mg via INTRAVENOUS
  Administered 2019-01-03: 20 mg via INTRAVENOUS

## 2019-01-03 MED ORDER — SUGAMMADEX SODIUM 500 MG/5ML IV SOLN
INTRAVENOUS | Status: AC
  Filled 2019-01-03: qty 5

## 2019-01-03 SURGICAL SUPPLY — 72 items
BANDAGE ACE 3X5.8 VEL STRL LF (GAUZE/BANDAGES/DRESSINGS) ×3 IMPLANT
BANDAGE ACE 4X5 VEL STRL LF (GAUZE/BANDAGES/DRESSINGS) ×3 IMPLANT
BANDAGE ELASTIC 4 VELCRO ST LF (GAUZE/BANDAGES/DRESSINGS) ×1 IMPLANT
BIT DRILL 2.0 LNG QUCK RELEASE (BIT) IMPLANT
BIT DRILL 2.8 QUICK RELEASE (BIT) IMPLANT
BNDG CMPR 9X4 STRL LF SNTH (GAUZE/BANDAGES/DRESSINGS) ×1
BNDG COHESIVE 4X5 TAN STRL (GAUZE/BANDAGES/DRESSINGS) ×3 IMPLANT
BNDG ESMARK 4X9 LF (GAUZE/BANDAGES/DRESSINGS) ×3 IMPLANT
BNDG GAUZE ELAST 4 BULKY (GAUZE/BANDAGES/DRESSINGS) ×9 IMPLANT
CORD BIPOLAR FORCEPS 12FT (ELECTRODE) ×3 IMPLANT
COVER MAYO STAND STRL (DRAPES) ×3 IMPLANT
COVER SURGICAL LIGHT HANDLE (MISCELLANEOUS) ×3 IMPLANT
COVER WAND RF STERILE (DRAPES) ×1 IMPLANT
CUFF TOURN SGL QUICK 18X4 (TOURNIQUET CUFF) ×3 IMPLANT
CUFF TOURN SGL QUICK 24 (TOURNIQUET CUFF)
CUFF TRNQT CYL 24X4X16.5-23 (TOURNIQUET CUFF) IMPLANT
DRAPE INCISE IOBAN 66X45 STRL (DRAPES) ×3 IMPLANT
DRAPE OEC MINIVIEW 54X84 (DRAPES) ×2 IMPLANT
DRILL 2.0 LNG QUICK RELEASE (BIT) ×3
DRILL 2.8 QUICK RELEASE (BIT) ×3
GAUZE SPONGE 4X4 12PLY STRL (GAUZE/BANDAGES/DRESSINGS) ×3 IMPLANT
GAUZE XEROFORM 1X8 LF (GAUZE/BANDAGES/DRESSINGS) ×3 IMPLANT
GLOVE BIOGEL M 8.0 STRL (GLOVE) ×3 IMPLANT
GLOVE BIOGEL PI IND STRL 6.5 (GLOVE) IMPLANT
GLOVE BIOGEL PI INDICATOR 6.5 (GLOVE) ×2
GLOVE SS BIOGEL STRL SZ 8 (GLOVE) ×1 IMPLANT
GLOVE SUPERSENSE BIOGEL SZ 8 (GLOVE) ×2
GOWN STRL REUS W/ TWL LRG LVL3 (GOWN DISPOSABLE) ×2 IMPLANT
GOWN STRL REUS W/ TWL XL LVL3 (GOWN DISPOSABLE) ×3 IMPLANT
GOWN STRL REUS W/TWL LRG LVL3 (GOWN DISPOSABLE) ×6
GOWN STRL REUS W/TWL XL LVL3 (GOWN DISPOSABLE) ×9
GUIDEWIRE ORTH 6X062XTROC NS (WIRE) IMPLANT
K-WIRE .062 (WIRE) ×6
KIT BASIN OR (CUSTOM PROCEDURE TRAY) ×3 IMPLANT
KIT TURNOVER KIT B (KITS) ×3 IMPLANT
LOOP VESSEL MAXI BLUE (MISCELLANEOUS) ×2 IMPLANT
MANIFOLD NEPTUNE II (INSTRUMENTS) ×3 IMPLANT
NDL HYPO 25GX1X1/2 BEV (NEEDLE) IMPLANT
NEEDLE HYPO 25GX1X1/2 BEV (NEEDLE) ×3 IMPLANT
NS IRRIG 1000ML POUR BTL (IV SOLUTION) ×3 IMPLANT
PACK ORTHO EXTREMITY (CUSTOM PROCEDURE TRAY) ×3 IMPLANT
PAD ARMBOARD 7.5X6 YLW CONV (MISCELLANEOUS) ×6 IMPLANT
PAD CAST 4YDX4 CTTN HI CHSV (CAST SUPPLIES) ×2 IMPLANT
PADDING CAST COTTON 4X4 STRL (CAST SUPPLIES) ×3
PADDING CAST SYNTHETIC 4 (CAST SUPPLIES) ×2
PADDING CAST SYNTHETIC 4X4 STR (CAST SUPPLIES) IMPLANT
PLATE OLECRANON RT .3HOLE (Plate) ×2 IMPLANT
SCREW HEX LOCK 2.7X16MM (Screw) ×4 IMPLANT
SCREW HEX NON LOCK 3.5X26MM (Screw) ×2 IMPLANT
SCREW HEX NON LOCK 3.5X55MM (Screw) ×2 IMPLANT
SCREW HEXALOBE LOCKING 3.5X16M (Screw) ×2 IMPLANT
SCREW LOCK 18X2.7X HEXALOBE (Screw) IMPLANT
SCREW LOCK 24X3.5X HEXALOBE (Screw) IMPLANT
SCREW LOCKING 2.7X18MM (Screw) ×6 IMPLANT
SCREW LOCKING 3.5X24 (Screw) ×3 IMPLANT
SCREW NONLOCKING 3.5X28MM (Screw) ×4 IMPLANT
SCRUB BETADINE 4OZ XXX (MISCELLANEOUS) ×3 IMPLANT
SOL PREP POV-IOD 4OZ 10% (MISCELLANEOUS) ×4 IMPLANT
SPECIMEN JAR SMALL (MISCELLANEOUS) ×3 IMPLANT
SUT PROLENE 3 0 PS 2 (SUTURE) ×2 IMPLANT
SUT PROLENE 4 0 PS 2 18 (SUTURE) ×2 IMPLANT
SUT VIC AB 0 CT1 36 (SUTURE) ×2 IMPLANT
SUT VIC AB 2-0 SH 27 (SUTURE) ×3
SUT VIC AB 2-0 SH 27XBRD (SUTURE) IMPLANT
SUT VIC AB 3-0 FS2 27 (SUTURE) ×3 IMPLANT
SYR CONTROL 10ML LL (SYRINGE) ×2 IMPLANT
TOWEL GREEN STERILE FF (TOWEL DISPOSABLE) ×3 IMPLANT
TOWEL OR 17X26 10 PK STRL BLUE (TOWEL DISPOSABLE) ×6 IMPLANT
TUBE CONNECTING 12'X1/4 (SUCTIONS) ×1
TUBE CONNECTING 12X1/4 (SUCTIONS) ×1 IMPLANT
UNDERPAD 30X30 (UNDERPADS AND DIAPERS) ×3 IMPLANT
WATER STERILE IRR 1000ML POUR (IV SOLUTION) ×3 IMPLANT

## 2019-01-03 NOTE — Anesthesia Postprocedure Evaluation (Signed)
Anesthesia Post Note  Patient: Peter [name]  Procedure(s) Performed: OPEN REDUCTION INTERNAL FIXATION (ORIF) ELBOW/OLECRANON FRACTURE (Right )     Patient location during evaluation: PACU Anesthesia Type: General Level of consciousness: awake Pain management: pain level controlled Vital Signs Assessment: post-procedure vital signs reviewed and stable Respiratory status: spontaneous breathing, nonlabored ventilation, respiratory function stable and patient connected to nasal cannula oxygen Cardiovascular status: blood pressure returned to baseline and stable Postop Assessment: no apparent nausea or vomiting Anesthetic complications: no    Last Vitals:  Vitals:   01/03/19 1136 01/03/19 1200  BP: 114/71 124/76  Pulse: 87 83  Resp: 17 16  Temp:    SpO2: 97% 93%    Last Pain:  Vitals:   01/03/19 1456  TempSrc:   PainSc: 5                  Bradley Handyside P Farhan Jean
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
## 2019-01-03 NOTE — Plan of Care (Signed)

## 2019-01-03 NOTE — Anesthesia Procedure Notes (Signed)
Procedure Name: Intubation Date/Time: 01/03/2019 9:01 AM Performed by: Imagene Riches, CRNA Pre-anesthesia Checklist: Patient identified, Emergency Drugs available, Suction available and Patient being monitored Patient Re-evaluated:Patient Re-evaluated prior to induction Oxygen Delivery Method: Circle System Utilized Preoxygenation: Pre-oxygenation with 100% oxygen Induction Type: IV induction Ventilation: Mask ventilation without difficulty Laryngoscope Size: Miller and 2 Grade View: Grade I Tube type: Oral Tube size: 7.5 mm Number of attempts: 1 Airway Equipment and Method: Stylet and Oral airway Placement Confirmation: ETT inserted through vocal cords under direct vision,  positive ETCO2 and breath sounds checked- equal and bilateral Secured at: 21 cm Tube secured with: Tape Dental Injury: Teeth and Oropharynx as per pre-operative assessment
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
## 2019-01-03 NOTE — Transfer of Care (Signed)
Immediate Anesthesia Transfer of Care Note  Patient: Peter [name]  Procedure(s) Performed: OPEN REDUCTION INTERNAL FIXATION (ORIF) ELBOW/OLECRANON FRACTURE (Right )  Patient Location: PACU  Anesthesia Type:General  Level of Consciousness: awake and alert   Airway & Oxygen Therapy: Patient Spontanous Breathing and Patient connected to nasal cannula oxygen  Post-op Assessment: Report given to RN and Post -op Vital signs reviewed and stable  Post vital signs: Reviewed and stable  Last Vitals:  Vitals Value Taken Time  BP 147/83 01/03/19 1051  Temp 36.1 C 01/03/19 1051  Pulse 87 01/03/19 1053  Resp 16 01/03/19 1053  SpO2 100 % 01/03/19 1053  Vitals shown include unvalidated device data.  Last Pain:  Vitals:   01/03/19 0440  TempSrc: Oral  PainSc:       Patients Stated Pain Goal: 2 (14/64/31 4276)  Complications: No apparent anesthesia complications

## 2019-01-03 NOTE — Progress Notes (Signed)
Provided discharge education/instructions, all questions and concerns addressed, Pt not in distress, discharged home with belongings accompanied by mother.
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
## 2019-01-03 NOTE — Anesthesia Preprocedure Evaluation (Addendum)
Anesthesia Evaluation  Patient identified by MRN, date of birth, ID bandGeneral Assessment Comment:Patient responds to tactile stimulation   Reviewed: Allergy & Precautions, NPO status , Patient's Chart, lab work & pertinent test results  Airway Mallampati: II  TM Distance: >3 FB Neck ROM: Full    Dental  (+) Edentulous Upper, Edentulous Lower   Pulmonary COPD,  COPD inhaler, Current Smoker,    Pulmonary exam normal breath sounds clear to auscultation       Cardiovascular hypertension, Normal cardiovascular exam Rhythm:Regular Rate:Normal  ECHO: Left ventricle: The cavity size was normal. Wall thickness was normal. Systolic function was normal. The estimated ejection fraction was in the range of 55% to 60%. Wall motion was normal; there were no regional wall motion abnormalities. Left ventricular diastolic function parameters were normal. Mitral valve: There was mild regurgitation.    Neuro/Psych PSYCHIATRIC DISORDERS Anxiety Depression CVA, No Residual Symptoms    GI/Hepatic negative GI ROS, (+) Cirrhosis     substance abuse  alcohol use and cocaine use,   Endo/Other  negative endocrine ROSdiabetes  Renal/GU negative Renal ROS     Musculoskeletal Chronic pain Motor and sensation intact to right hand   Abdominal   Peds  Hematology HLD   Anesthesia Other Findings elbow fracture  Reproductive/Obstetrics                           Anesthesia Physical Anesthesia Plan  ASA: III  Anesthesia Plan: General   Post-op Pain Management:    Induction: Intravenous  PONV Risk Score and Plan: 1 and Ondansetron, Dexamethasone and Treatment may vary due to age or medical condition  Airway Management Planned: Oral ETT  Additional Equipment:   Intra-op Plan:   Post-operative Plan: Extubation in OR  Informed Consent: I have reviewed the patients History and Physical, chart, labs and discussed the  procedure including the risks, benefits and alternatives for the proposed anesthesia with the patient or authorized representative who has indicated his/her understanding and acceptance.       Plan Discussed with: CRNA  Anesthesia Plan Comments: (Post op regional anesthesia discussed)        Anesthesia Quick Evaluation

## 2019-01-03 NOTE — Discharge Instructions (Signed)
Discharge Instructions  - Keep dressings in place. Do not remove them. - The dressings and splint must stay dry - Take all medication as prescribed. Transition to over the counter pain medication as your pain improves - Keep the arm elevated over the next 48-72 hours to help with pain and swelling - Move all digits not restricted by the dressings regularly to prevent stiffness - Please call to schedule a follow up appointment with Dr. Jeannie Fend and therapy at [phone] for 10-14 days following surgery - Your pain medication have been send digitally to your pharmacy    Rib Fracture  A rib fracture is a break or crack in one of the bones of the ribs. The ribs are like a cage that goes around your upper chest. A broken or cracked rib is often painful, but most do not cause other problems. Most rib fractures usually heal on their own in 1-3 months. Follow these instructions at home: Managing pain, stiffness, and swelling  If directed, apply ice to the injured area. ? Put ice in a plastic bag. ? Place a towel between your skin and the bag. ? Leave the ice on for 20 minutes, 2-3 times a day.  Take over-the-counter and prescription medicines only as told by your doctor. Activity  Avoid activities that cause pain to the injured area. Protect your injured area.  Slowly increase activity as told by your doctor. General instructions  Do deep breathing as told by your doctor. You may be told to: ? Take deep breaths many times a day. ? Cough many times a day while hugging a pillow. ? Use a device (incentive spirometer) to do deep breathing many times a day.  Drink enough fluid to keep your pee (urine) clear or pale yellow.  Do not wear a rib belt or binder. These do not allow you to breathe deeply.  Keep all follow-up visits as told by your doctor. This is important. Contact a doctor if:  You have a fever. Get help right away if:  You have trouble breathing.  You are short of  breath.  You cannot stop coughing.  You cough up thick or bloody spit (sputum).  You feel sick to your stomach (nauseous), throw up (vomit), or have belly (abdominal) pain.  Your pain gets worse and medicine does not help. Summary  A rib fracture is a break or crack in one of the bones of the ribs.  Apply ice to the injured area and take medicines for pain as told by your doctor.  Take deep breaths and cough many times a day. Hug a pillow every time you cough. This information is not intended to replace advice given to you by your health care provider. Make sure you discuss any questions you have with your health care provider. Document Released: 04/01/2008 Document Revised: 06/05/2017 Document Reviewed: 09/23/2016 Elsevier Patient Education  2020 [employer].
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
## 2019-01-11 ENCOUNTER — Encounter (HOSPITAL_COMMUNITY): Payer: Self-pay | Admitting: Orthopaedic Surgery

## 2019-03-21 ENCOUNTER — Other Ambulatory Visit (HOSPITAL_COMMUNITY)
Admission: RE | Admit: 2019-03-21 | Discharge: 2019-03-21 | Disposition: A | Discharge status: Home / Self Care | Payer: Medicare Other | Source: Ambulatory Visit | Attending: Orthopaedic Surgery | Admitting: Orthopaedic Surgery

## 2019-03-21 DIAGNOSIS — Z20828 Contact with and (suspected) exposure to other viral communicable diseases: Secondary | ICD-10-CM | POA: Diagnosis not present

## 2019-03-21 DIAGNOSIS — Z01812 Encounter for preprocedural laboratory examination: Secondary | ICD-10-CM | POA: Insufficient documentation

## 2019-03-22 ENCOUNTER — Encounter (HOSPITAL_COMMUNITY): Payer: Self-pay | Admitting: *Deleted

## 2019-03-22 ENCOUNTER — Other Ambulatory Visit: Payer: Self-pay

## 2019-03-22 LAB — NOVEL CORONAVIRUS, NAA (HOSP ORDER, SEND-OUT TO REF LAB; TAT 18-24 HRS): SARS-CoV-2, NAA: NOT DETECTED

## 2019-03-22 NOTE — Pre-Procedure Instructions (Addendum)
    Peter [name]  03/22/2019       Your procedure is scheduled on Thursday, September 17..  Report to [facility], Main Entrance or Entrance "A" at 5:30 AM  Call this number if you have problems the morning of surgery: [phone]  This is the number for the Pre- Surgical Desk.     Remember:  Do not eat after midnight.  You may drink clear liquids until 5:00 AM .  Clear liquids allowed are:                    Water, Juice (non-citric and without pulp), Carbonated beverages, Clear Tea, Black Coffee only, Plain Jell-O only, Gatorade and Plain Popsicles only  Drink the Pre- Surgery Ensure between 4:30 AM and 5:00 AM.  DO not drink anything after 5:00 AM. Take these medicines the morning of surgery with A SIP OF WATER : If needed : Hydrocodone- Acetaminophen Use Albuterol inhaler if needed and bring the inhaler with you.                 Shower, brush teeth and wear clean clothes.  Do not wear jewelry, make-up or nail polish.  Do not wear lotions, powders, or perfumes, or deodorant.   Men may shave face and neck.  Do not bring valuables to the hospital.  [facility] is not responsible for any belongings or valuables.  Contacts, dentures or bridgework may not be worn into surgery  After surgery it may be brought to your room.

## 2019-03-22 NOTE — Progress Notes (Addendum)
Mr Peter [name] denies chest pain or shortness of breath. Patient had a negative Covid test 03/21/2019, he has been in quarantine with his caregiver. Ms Peter [name] found the PCP name, Dr Dustin Folks at  [facility], we will request records from. Ms Peter [name] said that the medications had not changed from 12/2018, the d/c notes read that patient is on Eliquis and is to resume.  Ms Peter [name] reports that patient has not taken Eliquis in months. Patient denies having diabetes and he is not on blood pressure medications. Mr Peter [name] reports that he uses cocaine and did so today.  I informed patient that he needs to not have drugs in system tthe morning of surgery.  I instructed patient to not eat after midnight, I instructed patient that may have clear liquids until 5: 00 AM on Thursday.  I went over what clear liquids consist of, patient says he drinks water. I told him that Dr ordered a Pre- Surgery beverage to drink between 4:30 and 5:00 am. Ms Peter [name] states that they can come by the hospital tomorrow to pick the Pre- Surgery Ensure up.  I have aske dthe anesthesia., PA- C to review chart.

## 2019-03-23 ENCOUNTER — Encounter (HOSPITAL_COMMUNITY): Payer: Self-pay | Admitting: Vascular Surgery

## 2019-03-23 NOTE — Anesthesia Preprocedure Evaluation (Deleted)
Anesthesia Evaluation    Reviewed: Allergy & Precautions, NPO status , Patient's Chart, lab work & pertinent test results  Airway        Dental   Pulmonary Current Smoker and Patient abstained from smoking.,           Cardiovascular hypertension, +CHF       Neuro/Psych    GI/Hepatic   Endo/Other  diabetes  Renal/GU Renal disease     Musculoskeletal   Abdominal   Peds  Hematology   Anesthesia Other Findings   Reproductive/Obstetrics                            Anesthesia Physical Anesthesia Plan  ASA: III  Anesthesia Plan: General   Post-op Pain Management:    Induction:   PONV Risk Score and Plan: Ondansetron, Dexamethasone and Midazolam  Airway Management Planned: Oral ETT  Additional Equipment:   Intra-op Plan:   Post-operative Plan:   Informed Consent:   Plan Discussed with: Anesthesiologist  Anesthesia Plan Comments: (See PAT note written 03/23/2019 by Myra Gianotti, PA-C. SAME DAY WORK-UP.   )       Anesthesia Quick Evaluation
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
## 2019-03-24 ENCOUNTER — Ambulatory Visit (HOSPITAL_COMMUNITY): Admission: RE | Admit: 2019-03-24 | Payer: Medicare Other | Source: Home / Self Care | Admitting: Orthopaedic Surgery

## 2019-03-24 SURGERY — OPEN REDUCTION INTERNAL FIXATION (ORIF) ELBOW/OLECRANON FRACTURE
Anesthesia: General | Site: Elbow | Laterality: Right

## 2019-03-30 ENCOUNTER — Encounter (HOSPITAL_COMMUNITY): Payer: Self-pay | Admitting: *Deleted

## 2019-03-30 ENCOUNTER — Other Ambulatory Visit (HOSPITAL_COMMUNITY)
Admission: RE | Admit: 2019-03-30 | Discharge: 2019-03-30 | Disposition: A | Discharge status: Home / Self Care | Payer: Medicare Other | Source: Ambulatory Visit | Attending: Orthopaedic Surgery | Admitting: Orthopaedic Surgery

## 2019-03-30 DIAGNOSIS — Z20828 Contact with and (suspected) exposure to other viral communicable diseases: Secondary | ICD-10-CM | POA: Insufficient documentation

## 2019-03-30 DIAGNOSIS — Z01812 Encounter for preprocedural laboratory examination: Secondary | ICD-10-CM | POA: Diagnosis present

## 2019-03-30 LAB — SARS CORONAVIRUS 2 (TAT 6-24 HRS): SARS Coronavirus 2: NEGATIVE

## 2019-03-30 NOTE — Progress Notes (Signed)
Anesthesia follow-up: See my 03/23/19 PAT SAME DAY WORK-UP note. To my understanding, patient did not show for his 03/24/19 surgery which has been rescheduled for 03/31/19. He is still a SAME DAY WORK-UP.   Per my previous discussion with anesthesiologist Albertha Ghee, MD--recommendation for preoperative CXR given 01/02/19 CT findings showing aspirated material in his right mainstem bronchus. He is for labs and updated EKG as well on arrival.   03/30/19 COVID-19 test in in process.   Myra Gianotti, PA-C Surgical Short Stay/Anesthesiology [facility] Phone [phone] [facility] Phone [phone] 03/30/2019 12:11 PM

## 2019-03-30 NOTE — Progress Notes (Addendum)
Patient denies shortness of breath, fever, cough and chest pain.  PCP - Dr Dustin Folks Cardiologist - Denies  Chest x-ray - 04/18/17 1 view CT Chest 01/02/19 EKG - 04/09/16 Stress Test -  ECHO - 08/01/18 Cardiac Cath - Denies  ERAS: Pt has ERAS drink.from prior scheduled surgery that was cancelled.  Patient to complete drink by 11 am DOS. Clears til 11 am DOS. Clear liquids explained to patient.  Cocaine last use -  03/29/19.  Patient instructed no to use cocaine today (9/23) or tomorrow (03/31/19) DOS.  Patient verbalized understanding.  Anesthesia review: Ebony Hail, [place]   STOP now taking any Aspirin (unless otherwise instructed by your surgeon), Aleve, Naproxen, Ibuprofen, Motrin, Advil, Goody's, BC's, all herbal medications, fish oil, and all vitamins.   Coronavirus Screening Have you experienced the following symptoms:  Cough yes/no: No Fever (>100.95F)  yes/no: No Runny nose yes/no: No Sore throat yes/no: No Difficulty breathing/shortness of breath  yes/no: No  Have you ttraveled in the last 14 days and where? yes/no: No  Patient verbalized understanding of instructions that were given to him via phone.

## 2019-03-30 NOTE — Anesthesia Preprocedure Evaluation (Addendum)
Anesthesia Evaluation  Patient identified by MRN, date of birth, ID band Patient awake    Reviewed: Allergy & Precautions, NPO status , Patient's Chart, lab work & pertinent test results  History of Anesthesia Complications Negative for: history of anesthetic complications  Airway Mallampati: II  TM Distance: >3 FB Neck ROM: Full    Dental  (+) Edentulous Upper, Edentulous Lower   Pulmonary COPD, Current SmokerPatient did not abstain from smoking.,   CXR - IMPRESSION: Given persistence of nodular opacity in the right upper chest, consider chest CT to exclude persistence/pulmonary neoplasm in this location. No signs of pneumonia or effusion.    Pulmonary exam normal        Cardiovascular hypertension, Normal cardiovascular exam   '20 TTE - EF 55% to 60%. Mild MR.    Neuro/Psych PSYCHIATRIC DISORDERS Anxiety Depression CVA, Residual Symptoms    GI/Hepatic negative GI ROS, (+) Cirrhosis     substance abuse  alcohol use and cocaine use,   Endo/Other  diabetes, Type 2  Renal/GU Renal disease     Musculoskeletal negative musculoskeletal ROS (+)   Abdominal   Peds  Hematology negative hematology ROS (+)   Anesthesia Other Findings   Reproductive/Obstetrics                         Anesthesia Physical Anesthesia Plan  ASA: III  Anesthesia Plan: General   Post-op Pain Management:  Regional for Post-op pain   Induction: Intravenous  PONV Risk Score and Plan: 2 and Treatment may vary due to age or medical condition, Ondansetron and Dexamethasone  Airway Management Planned: Oral ETT  Additional Equipment: None  Intra-op Plan:   Post-operative Plan: Extubation in OR  Informed Consent: I have reviewed the patients History and Physical, chart, labs and discussed the procedure including the risks, benefits and alternatives for the proposed anesthesia with the patient or authorized  representative who has indicated his/her understanding and acceptance.     Dental advisory given  Plan Discussed with: CRNA and Anesthesiologist  Anesthesia Plan Comments: ( )      Anesthesia Quick Evaluation

## 2019-03-31 ENCOUNTER — Encounter (HOSPITAL_COMMUNITY): Payer: Self-pay

## 2019-03-31 ENCOUNTER — Ambulatory Visit (HOSPITAL_COMMUNITY): Payer: Medicare Other | Admitting: Vascular Surgery

## 2019-03-31 ENCOUNTER — Other Ambulatory Visit: Payer: Self-pay

## 2019-03-31 ENCOUNTER — Ambulatory Visit (HOSPITAL_COMMUNITY)
Admission: RE | Admit: 2019-03-31 | Discharge: 2019-03-31 | Disposition: A | Discharge status: Home / Self Care | Payer: Medicare Other | Attending: Orthopaedic Surgery | Admitting: Orthopaedic Surgery

## 2019-03-31 ENCOUNTER — Encounter (HOSPITAL_COMMUNITY)
Admission: RE | Disposition: A | Discharge status: Home / Self Care | Payer: Self-pay | Source: Home / Self Care | Attending: Orthopaedic Surgery

## 2019-03-31 ENCOUNTER — Ambulatory Visit (HOSPITAL_COMMUNITY): Payer: Medicare Other

## 2019-03-31 DIAGNOSIS — S52021K Displaced fracture of olecranon process without intraarticular extension of right ulna, subsequent encounter for closed fracture with nonunion: Secondary | ICD-10-CM | POA: Diagnosis present

## 2019-03-31 DIAGNOSIS — E119 Type 2 diabetes mellitus without complications: Secondary | ICD-10-CM | POA: Insufficient documentation

## 2019-03-31 DIAGNOSIS — X58XXXD Exposure to other specified factors, subsequent encounter: Secondary | ICD-10-CM | POA: Diagnosis not present

## 2019-03-31 DIAGNOSIS — Z79899 Other long term (current) drug therapy: Secondary | ICD-10-CM | POA: Diagnosis not present

## 2019-03-31 DIAGNOSIS — K7460 Unspecified cirrhosis of liver: Secondary | ICD-10-CM | POA: Diagnosis not present

## 2019-03-31 DIAGNOSIS — I69354 Hemiplegia and hemiparesis following cerebral infarction affecting left non-dominant side: Secondary | ICD-10-CM | POA: Insufficient documentation

## 2019-03-31 DIAGNOSIS — H9190 Unspecified hearing loss, unspecified ear: Secondary | ICD-10-CM | POA: Insufficient documentation

## 2019-03-31 DIAGNOSIS — T17908D Unspecified foreign body in respiratory tract, part unspecified causing other injury, subsequent encounter: Secondary | ICD-10-CM

## 2019-03-31 DIAGNOSIS — I10 Essential (primary) hypertension: Secondary | ICD-10-CM | POA: Diagnosis not present

## 2019-03-31 DIAGNOSIS — Z7982 Long term (current) use of aspirin: Secondary | ICD-10-CM | POA: Diagnosis not present

## 2019-03-31 DIAGNOSIS — E785 Hyperlipidemia, unspecified: Secondary | ICD-10-CM | POA: Diagnosis not present

## 2019-03-31 DIAGNOSIS — J439 Emphysema, unspecified: Secondary | ICD-10-CM | POA: Insufficient documentation

## 2019-03-31 DIAGNOSIS — F17210 Nicotine dependence, cigarettes, uncomplicated: Secondary | ICD-10-CM | POA: Diagnosis not present

## 2019-03-31 HISTORY — PX: ORIF ELBOW FRACTURE: SHX5031

## 2019-03-31 HISTORY — DX: Dependence on other enabling machines and devices: Z99.89

## 2019-03-31 LAB — GLUCOSE, CAPILLARY
Glucose-Capillary: 208 mg/dL — ABNORMAL HIGH (ref 70–99)
Glucose-Capillary: 94 mg/dL (ref 70–99)

## 2019-03-31 LAB — CBC
HCT: 42.5 % (ref 39.0–52.0)
Hemoglobin: 13.6 g/dL (ref 13.0–17.0)
MCH: 25 pg — ABNORMAL LOW (ref 26.0–34.0)
MCHC: 32 g/dL (ref 30.0–36.0)
MCV: 78.1 fL — ABNORMAL LOW (ref 80.0–100.0)
Platelets: 413 10*3/uL — ABNORMAL HIGH (ref 150–400)
RBC: 5.44 MIL/uL (ref 4.22–5.81)
RDW: 18 % — ABNORMAL HIGH (ref 11.5–15.5)
WBC: 3 10*3/uL — ABNORMAL LOW (ref 4.0–10.5)
nRBC: 0 % (ref 0.0–0.2)

## 2019-03-31 LAB — COMPREHENSIVE METABOLIC PANEL
ALT: 10 U/L (ref 0–44)
AST: 19 U/L (ref 15–41)
Albumin: 3.6 g/dL (ref 3.5–5.0)
Alkaline Phosphatase: 55 U/L (ref 38–126)
Anion gap: 9 (ref 5–15)
BUN: 9 mg/dL (ref 8–23)
CO2: 22 mmol/L (ref 22–32)
Calcium: 8.8 mg/dL — ABNORMAL LOW (ref 8.9–10.3)
Chloride: 105 mmol/L (ref 98–111)
Creatinine, Ser: 0.93 mg/dL (ref 0.61–1.24)
GFR calc Af Amer: >60 mL/min (ref >60)
GFR calc non Af Amer: >60 mL/min (ref >60)
Glucose, Bld: 83 mg/dL (ref 70–99)
Potassium: 3.9 mmol/L (ref 3.5–5.1)
Sodium: 136 mmol/L (ref 135–145)
Total Bilirubin: 0.5 mg/dL (ref 0.3–1.2)
Total Protein: 6.7 g/dL (ref 6.5–8.1)

## 2019-03-31 SURGERY — OPEN REDUCTION INTERNAL FIXATION (ORIF) ELBOW/OLECRANON FRACTURE
Anesthesia: General | Site: Elbow | Laterality: Right

## 2019-03-31 MED ORDER — ONDANSETRON HCL 4 MG/2ML IJ SOLN: 4.0000 mg | Freq: Once | INTRAMUSCULAR | Status: DC | PRN

## 2019-03-31 MED ORDER — ONDANSETRON HCL 4 MG/2ML IJ SOLN
INTRAMUSCULAR | Status: AC
  Filled 2019-03-31: qty 2

## 2019-03-31 MED ORDER — ROCURONIUM BROMIDE 10 MG/ML (PF) SYRINGE
PREFILLED_SYRINGE | INTRAVENOUS | Status: AC
  Filled 2019-03-31: qty 10

## 2019-03-31 MED ORDER — OXYCODONE HCL 5 MG/5ML PO SOLN: 5.0000 mg | Freq: Once | ORAL | Status: DC | PRN

## 2019-03-31 MED ORDER — LACTATED RINGERS IV SOLN: INTRAVENOUS | Status: DC

## 2019-03-31 MED ORDER — CEFAZOLIN SODIUM-DEXTROSE 2-3 GM-%(50ML) IV SOLR
INTRAVENOUS | Status: DC | PRN
  Administered 2019-03-31: 2 g via INTRAVENOUS

## 2019-03-31 MED ORDER — MIDAZOLAM HCL 2 MG/2ML IJ SOLN
INTRAMUSCULAR | Status: DC | PRN
  Administered 2019-03-31: 1 mg via INTRAVENOUS

## 2019-03-31 MED ORDER — HYDROCODONE-ACETAMINOPHEN 5-325 MG PO TABS: 1.0000 | ORAL_TABLET | Freq: Four times a day (QID) | ORAL | 0 refills | Status: DC | PRN

## 2019-03-31 MED ORDER — LACTATED RINGERS IV SOLN
INTRAVENOUS | Status: DC
  Administered 2019-03-31 (×2): via INTRAVENOUS

## 2019-03-31 MED ORDER — FENTANYL CITRATE (PF) 100 MCG/2ML IJ SOLN
INTRAMUSCULAR | Status: AC
  Administered 2019-03-31: 50 ug via INTRAVENOUS
  Filled 2019-03-31: qty 2

## 2019-03-31 MED ORDER — SODIUM CHLORIDE 0.9 % IV SOLN
INTRAVENOUS | Status: DC | PRN
  Administered 2019-03-31: 15 ug/min via INTRAVENOUS

## 2019-03-31 MED ORDER — PHENYLEPHRINE 40 MCG/ML (10ML) SYRINGE FOR IV PUSH (FOR BLOOD PRESSURE SUPPORT)
PREFILLED_SYRINGE | INTRAVENOUS | Status: AC
  Filled 2019-03-31: qty 10

## 2019-03-31 MED ORDER — MIDAZOLAM HCL 2 MG/2ML IJ SOLN
INTRAMUSCULAR | Status: AC
  Administered 2019-03-31: 2 mg via INTRAVENOUS
  Filled 2019-03-31: qty 2

## 2019-03-31 MED ORDER — LIDOCAINE 2% (20 MG/ML) 5 ML SYRINGE
INTRAMUSCULAR | Status: AC
  Filled 2019-03-31: qty 5

## 2019-03-31 MED ORDER — FENTANYL CITRATE (PF) 100 MCG/2ML IJ SOLN: 25.0000 ug | INTRAMUSCULAR | Status: DC | PRN

## 2019-03-31 MED ORDER — PROPOFOL 10 MG/ML IV BOLUS
INTRAVENOUS | Status: DC | PRN
  Administered 2019-03-31: 120 mg via INTRAVENOUS

## 2019-03-31 MED ORDER — FENTANYL CITRATE (PF) 250 MCG/5ML IJ SOLN
INTRAMUSCULAR | Status: DC | PRN
  Administered 2019-03-31 (×3): 50 ug via INTRAVENOUS

## 2019-03-31 MED ORDER — SODIUM CHLORIDE 0.9 % IR SOLN
Status: DC | PRN
  Administered 2019-03-31: 1000 mL

## 2019-03-31 MED ORDER — LIDOCAINE 2% (20 MG/ML) 5 ML SYRINGE
INTRAMUSCULAR | Status: DC | PRN
  Administered 2019-03-31: 60 mg via INTRAVENOUS

## 2019-03-31 MED ORDER — MIDAZOLAM HCL 2 MG/2ML IJ SOLN
2.0000 mg | Freq: Once | INTRAMUSCULAR | Status: AC
  Administered 2019-03-31: 14:00:00 2 mg via INTRAVENOUS

## 2019-03-31 MED ORDER — OXYCODONE HCL 5 MG PO TABS: 5.0000 mg | ORAL_TABLET | Freq: Once | ORAL | Status: DC | PRN

## 2019-03-31 MED ORDER — CHLORHEXIDINE GLUCONATE 4 % EX LIQD: 60.0000 mL | Freq: Once | CUTANEOUS | Status: DC

## 2019-03-31 MED ORDER — ONDANSETRON HCL 4 MG/2ML IJ SOLN
INTRAMUSCULAR | Status: DC | PRN
  Administered 2019-03-31: 4 mg via INTRAVENOUS

## 2019-03-31 MED ORDER — SUGAMMADEX SODIUM 200 MG/2ML IV SOLN
INTRAVENOUS | Status: DC | PRN
  Administered 2019-03-31: 150 mg via INTRAVENOUS

## 2019-03-31 MED ORDER — DEXAMETHASONE SODIUM PHOSPHATE 10 MG/ML IJ SOLN
INTRAMUSCULAR | Status: DC | PRN
  Administered 2019-03-31: 10 mg via INTRAVENOUS

## 2019-03-31 MED ORDER — BUPIVACAINE-EPINEPHRINE (PF) 0.5% -1:200000 IJ SOLN
INTRAMUSCULAR | Status: DC | PRN
  Administered 2019-03-31: 25 mL via PERINEURAL

## 2019-03-31 MED ORDER — FENTANYL CITRATE (PF) 250 MCG/5ML IJ SOLN
INTRAMUSCULAR | Status: AC
  Filled 2019-03-31: qty 5

## 2019-03-31 MED ORDER — ALBUMIN HUMAN 5 % IV SOLN
INTRAVENOUS | Status: DC | PRN
  Administered 2019-03-31: 16:00:00 via INTRAVENOUS

## 2019-03-31 MED ORDER — PHENYLEPHRINE HCL (PRESSORS) 10 MG/ML IV SOLN
INTRAVENOUS | Status: DC | PRN
  Administered 2019-03-31 (×3): 80 ug via INTRAVENOUS

## 2019-03-31 MED ORDER — ROCURONIUM BROMIDE 10 MG/ML (PF) SYRINGE
PREFILLED_SYRINGE | INTRAVENOUS | Status: DC | PRN
  Administered 2019-03-31: 50 mg via INTRAVENOUS

## 2019-03-31 MED ORDER — FENTANYL CITRATE (PF) 100 MCG/2ML IJ SOLN
50.0000 ug | Freq: Once | INTRAMUSCULAR | Status: AC
  Administered 2019-03-31: 14:00:00 50 ug via INTRAVENOUS

## 2019-03-31 MED ORDER — CEFAZOLIN SODIUM-DEXTROSE 2-4 GM/100ML-% IV SOLN
INTRAVENOUS | Status: AC
  Filled 2019-03-31: qty 100

## 2019-03-31 MED ORDER — DEXAMETHASONE SODIUM PHOSPHATE 10 MG/ML IJ SOLN
INTRAMUSCULAR | Status: AC
  Filled 2019-03-31: qty 1

## 2019-03-31 MED ORDER — MIDAZOLAM HCL 2 MG/2ML IJ SOLN
INTRAMUSCULAR | Status: AC
  Filled 2019-03-31: qty 2

## 2019-03-31 SURGICAL SUPPLY — 86 items
APL PRP STRL LF DISP 70% ISPRP (MISCELLANEOUS) ×2
APL SKNCLS STERI-STRIP NONHPOA (GAUZE/BANDAGES/DRESSINGS)
BENZOIN TINCTURE PRP APPL 2/3 (GAUZE/BANDAGES/DRESSINGS) IMPLANT
BIT DRILL 2.0 LNG QUCK RELEASE (BIT) ×1 IMPLANT
BIT DRILL 2.8 QUICK RELEASE (BIT) IMPLANT
BLADE CLIPPER SURG (BLADE) ×3 IMPLANT
BLADE SURG 10 STRL SS (BLADE) IMPLANT
BNDG CMPR 9X4 STRL LF SNTH (GAUZE/BANDAGES/DRESSINGS)
BNDG COHESIVE 4X5 TAN STRL (GAUZE/BANDAGES/DRESSINGS) ×3 IMPLANT
BNDG ELASTIC 6X5.8 VLCR STR LF (GAUZE/BANDAGES/DRESSINGS) ×3 IMPLANT
BNDG ESMARK 4X9 LF (GAUZE/BANDAGES/DRESSINGS) IMPLANT
BNDG GAUZE ELAST 4 BULKY (GAUZE/BANDAGES/DRESSINGS) ×3 IMPLANT
CHLORAPREP W/TINT 26 (MISCELLANEOUS) ×6 IMPLANT
CLOSURE WOUND 1/2 X4 (GAUZE/BANDAGES/DRESSINGS)
COVER SURGICAL LIGHT HANDLE (MISCELLANEOUS) ×3 IMPLANT
COVER WAND RF STERILE (DRAPES) ×3 IMPLANT
CUFF TOURN SGL QUICK 18X4 (TOURNIQUET CUFF) ×3 IMPLANT
CUFF TOURN SGL QUICK 24 (TOURNIQUET CUFF)
CUFF TRNQT CYL 24X4X16.5-23 (TOURNIQUET CUFF) IMPLANT
DRAPE C-ARM 42X72 X-RAY (DRAPES) ×3 IMPLANT
DRAPE IMP U-DRAPE 54X76 (DRAPES) ×6 IMPLANT
DRAPE INCISE IOBAN 66X45 STRL (DRAPES) ×3 IMPLANT
DRAPE ORTHO SPLIT 77X108 STRL (DRAPES) ×6
DRAPE SURG 17X23 STRL (DRAPES) ×3 IMPLANT
DRAPE SURG ORHT 6 SPLT 77X108 (DRAPES) ×2 IMPLANT
DRAPE U-SHAPE 47X51 STRL (DRAPES) ×3 IMPLANT
DRILL 2.0 LNG QUICK RELEASE (BIT) ×3
DRILL 2.8 QUICK RELEASE (BIT) ×3
DRSG PAD ABDOMINAL 8X10 ST (GAUZE/BANDAGES/DRESSINGS) ×6 IMPLANT
ELECT REM PT RETURN 9FT ADLT (ELECTROSURGICAL) ×3
ELECTRODE REM PT RTRN 9FT ADLT (ELECTROSURGICAL) ×1 IMPLANT
GAUZE SPONGE 4X4 12PLY STRL (GAUZE/BANDAGES/DRESSINGS) ×3 IMPLANT
GLOVE BIOGEL PI IND STRL 8 (GLOVE) ×2 IMPLANT
GLOVE BIOGEL PI INDICATOR 8 (GLOVE) ×4
GLOVE SURG SYN 7.5  E (GLOVE) ×2
GLOVE SURG SYN 7.5 E (GLOVE) ×1 IMPLANT
GOWN STRL REUS W/ TWL LRG LVL3 (GOWN DISPOSABLE) ×2 IMPLANT
GOWN STRL REUS W/TWL LRG LVL3 (GOWN DISPOSABLE) ×6
GUIDEWIRE ORTH 6X062XTROC NS (WIRE) ×4 IMPLANT
HANDPIECE INTERPULSE COAX TIP (DISPOSABLE) ×3
K-WIRE .062 (WIRE) ×12
KIT BASIN OR (CUSTOM PROCEDURE TRAY) ×3 IMPLANT
KIT TURNOVER KIT B (KITS) ×3 IMPLANT
MANIFOLD NEPTUNE II (INSTRUMENTS) ×3 IMPLANT
NS IRRIG 1000ML POUR BTL (IV SOLUTION) ×3 IMPLANT
PACK ORTHO EXTREMITY (CUSTOM PROCEDURE TRAY) ×3 IMPLANT
PAD ARMBOARD 7.5X6 YLW CONV (MISCELLANEOUS) ×6 IMPLANT
PADDING CAST COTTON 6X4 STRL (CAST SUPPLIES) ×2 IMPLANT
PLATE OLECRANON RT 110 7H (Plate) ×1 IMPLANT
PLATE OLECRANON RT 7H (Plate) ×3 IMPLANT
PUTTY DBX 2.5CC (Putty) ×3 IMPLANT
PUTTY DBX 2.5CC DEPUY (Putty) IMPLANT
SCREW HEX LOCK 2.7X14MM (Screw) ×3 IMPLANT
SCREW HEX LOCK 2.7X16MM (Screw) ×9 IMPLANT
SCREW HEX LOCK 3.5X20MM (Screw) ×3 IMPLANT
SCREW HEXALOBE VA 3.5X20 (Screw) ×2 IMPLANT
SCREW HEXALOBE VA 3.5X26 (Screw) ×3 IMPLANT
SCREW LOCK 18X3.5X HEXALOBE (Screw) IMPLANT
SCREW LOCK 55X3.5X HEXALOBE (Screw) ×1 IMPLANT
SCREW LOCKING 3.5X18MM (Screw) ×3 IMPLANT
SCREW LOCKING 3.5X55 (Screw) ×3 IMPLANT
SCREW LOCKING HEX 3.5X26MM (Screw) ×2 IMPLANT
SCREW NON LOCKING HEX 3.5X18MM (Screw) ×2 IMPLANT
SEALER BIPOLAR AQUA 6.0 (INSTRUMENTS) ×3 IMPLANT
SET HNDPC FAN SPRY TIP SCT (DISPOSABLE) ×1 IMPLANT
SLING ARM FOAM STRAP LRG (SOFTGOODS) ×3 IMPLANT
SPLINT FIBERGLASS 4X30 (CAST SUPPLIES) ×3 IMPLANT
SPONGE LAP 18X18 RF (DISPOSABLE) IMPLANT
STRIP CLOSURE SKIN 1/2X4 (GAUZE/BANDAGES/DRESSINGS) IMPLANT
SUCTION FRAZIER HANDLE 10FR (MISCELLANEOUS) ×2
SUCTION TUBE FRAZIER 10FR DISP (MISCELLANEOUS) ×1 IMPLANT
SUT MNCRL AB 4-0 PS2 18 (SUTURE) ×3 IMPLANT
SUT MON AB 2-0 CT1 36 (SUTURE) ×3 IMPLANT
SUT PROLENE 4 0 PS 2 18 (SUTURE) ×2 IMPLANT
SUT VIC AB 0 CT1 27 (SUTURE) ×3
SUT VIC AB 0 CT1 27XBRD ANBCTR (SUTURE) ×1 IMPLANT
SWAB COLLECTION DEVICE MRSA (MISCELLANEOUS) ×3 IMPLANT
SWAB CULTURE ESWAB REG 1ML (MISCELLANEOUS) ×2 IMPLANT
TOWEL GREEN STERILE (TOWEL DISPOSABLE) ×3 IMPLANT
TOWEL GREEN STERILE FF (TOWEL DISPOSABLE) ×3 IMPLANT
TUBE CONNECTING 12'X1/4 (SUCTIONS) ×1
TUBE CONNECTING 12X1/4 (SUCTIONS) ×2 IMPLANT
UNDERPAD 30X30 (UNDERPADS AND DIAPERS) ×3 IMPLANT
WATER STERILE IRR 1000ML POUR (IV SOLUTION) ×3 IMPLANT
WIRE TACK PLATE PL-PTACK (WIRE) ×2 IMPLANT
YANKAUER SUCT BULB TIP NO VENT (SUCTIONS) ×3 IMPLANT

## 2019-03-31 NOTE — Anesthesia Postprocedure Evaluation (Signed)
Anesthesia Post Note  Patient: Peter [name]  Procedure(s) Performed: right olecranon nonunion revision open reduction, internal fixation and surgery as indicated (Right Elbow)     Patient location during evaluation: PACU Anesthesia Type: General Level of consciousness: awake and alert Pain management: pain level controlled Vital Signs Assessment: post-procedure vital signs reviewed and stable Respiratory status: spontaneous breathing, nonlabored ventilation and respiratory function stable Cardiovascular status: blood pressure returned to baseline and stable Postop Assessment: no apparent nausea or vomiting Anesthetic complications: no    Last Vitals:  Vitals:   03/31/19 1800 03/31/19 1815  BP: (!) 132/94 105/62  Pulse: 75 80  Resp: 15 12  Temp:    SpO2: 100% 100%    Last Pain:  Vitals:   03/31/19 1800  TempSrc:   PainSc: 0-No pain                 Audry Pili

## 2019-03-31 NOTE — Progress Notes (Signed)
Chest-Xray resulted, Dr. Jeannie Fend made aware.

## 2019-03-31 NOTE — Anesthesia Procedure Notes (Signed)
Anesthesia Regional Block: Supraclavicular block   Pre-Anesthetic Checklist: ,, timeout performed, Correct Patient, Correct Site, Correct Laterality, Correct Procedure, Correct Position, site marked, Risks and benefits discussed,  Surgical consent,  Pre-op evaluation,  At surgeon's request and post-op pain management  Laterality: Right  Prep: chloraprep       Needles:  Injection technique: Single-shot  Needle Type: Echogenic Needle     Needle Length: 5cm  Needle Gauge: 21     Additional Needles:   Narrative:  Start time: 03/31/2019 2:11 PM End time: 03/31/2019 2:15 PM Injection made incrementally with aspirations every 5 mL.  Performed by: Personally  Anesthesiologist: Audry Pili, MD  Additional Notes: No pain on injection. No increased resistance to injection. Injection made in 5cc increments. Good needle visualization. Patient tolerated the procedure well.

## 2019-03-31 NOTE — Discharge Instructions (Signed)
Discharge Instructions ° °- Keep dressings in place. Do not remove them. °- The dressings must stay dry °- Take all medication as prescribed. Transition to over the counter pain medication as your pain improves °- Keep the hand elevated over the next 48-72 hours to help with pain and swelling °- Move all digits not restricted by the dressings regularly to prevent stiffness °- Please call to schedule a follow up appointment with Dr. Filimon Miranda and therapy at [phone] for 10-14 days following surgery °- Your pain medication have been send digitally to your pharmacy °

## 2019-03-31 NOTE — Op Note (Signed)
PREOPERATIVE DIAGNOSIS: Right olecranon nonunion  POSTOPERATIVE DIAGNOSIS: Same  ATTENDING PHYSICIAN: Maudry Mayhew. Jeannie Fend, III, MD who was present and scrubbed for the entire case   ASSISTANT SURGEON: None.   ANESTHESIA: Regional with general  SURGICAL PROCEDURES:  1.  Open reduction internal fixation of right olecranon nonunion 2.  Removal of deep hardware at the right olecranon  SURGICAL INDICATIONS: Patient is a 67 year old male who previously underwent open reduction internal fixation of his right olecranon approximately 3 months ago.  He presented to clinic recently and repeat radiographs showed gapping of his fracture site as well as loosening of the plate distally along the ulna.  On exam he had palpable plate in the subcutaneous skin but had no open wound.  After discussing treatment options I did recommend proceeding forward with open reduction and fixation revision of his right olecranon with removal of his initial plate and screw construct.  Patient presented today for that.  FINDING: The initial olecranon plate was loose distally at the cortical screws along the shaft.  The plate was removed and there was fibrous tissue through the fracture site.  This was debrided which resulted in grossly unstable olecranon fracture.  Revision open reduction internal fixation was achieved with a larger olecranon plate as well as bone graft within the nonunion site.  DESCRIPTION OF PROCEDURE: The patient was identified in the preoperative holding area where the risk benefits and alternatives of the procedure were discussed with the patient.  These include but not limited to infection, bleeding, damage to tract structures including blood vessels and nerves, pain, stiffness, malunion, nonunion, implant failure need for additional procedures.  Informed consent was obtained that time patient's right arm was marked with surgeon marking pen.  He then underwent a right upper extremity plexus block.  He was  brought to the operative suite where timeout was performed identifying the correct patient operative site.  He was initially placed supine on the operative table and induced under general endotracheal anesthesia.  He was then subsequently rolled into the lateral decubitus position with the right arm over a arm holder.  He was held with a beanbag.  All bony prominences were well-padded.  Tourniquet is placed on the upper arm and arm was then prepped and draped in usual sterile fashion the limb was exsanguinated and the tourniquet was inflated.  The posterior incision from the prior surgical procedure was utilized and extended both proximally distally.  Dissection was continued down through the subcutaneous tissues using electrocautery.  The prior posterior olecranon plate was identified and released from its soft tissue.  The previous screws were removed and there was gross loosening of the plate distally along the ulna.  The plate and screw construct was removed and the screw holes were gently debrided using a curette and rondure.  Aerobic and anaerobic cultures were obtained.  The fracture site was then identified and there was found to be abundant fibrous tissue between the 2 bone fragments.  This was debrided as well using a rondure and curette until all fibrous tissue had been removed.  Once the nonunion site was clear of its fibers tissue the bone was reduced and pinned into place with 062 K wires x2.  This was confirmed to be in appropriate alignment under fluoroscopic images.  There was a small gap still visualized on the lateral fluoroscopy image but the contour of the joint was appropriate with no significant step-off at the articular surface.  A longer, 7 hole Acumed olecranon plate was then  placed in appropriate position along the posterior ulna.  This was pinned into place and confirmed to be appropriate alignment under fluoroscopic images.  Multiple locking screws were then placed throughout the plate  securing it into position and bypassing the area of loose screws distally along the ulna.  There were 4 holes distal to the previous screw holes all of which had bicortical fixation with both locking and nonlocking screws.  Once all 6 screws have been placed appropriately and were deemed to be of appropriate length on fluoroscopic images the wound was copiously irrigated with normal saline.  Demineralized bone matrix was packed into the nonunion site.  Once again fluoroscopy images showed appropriate alignment and positioning of the plate as well as reduction and alignment of the articular surface of the olecranon.  The wound was once again irrigated and the deep fascia was closed with running 0 Vicryl suture.  3-0 Vicryl and 4-0 Prolene's were used to close the skin.  Xeroform, 4 x 4's and a well-padded long-arm splint was placed.  The tourniquet was released and the patient had return of brisk capillary refill to all of his digits.  He was woken from his anesthesia and extubated in the operating without a complications.  He was taken to the PACU in stable condition.  He tolerated procedure well and there are no complications.  RADIOGRAPHIC INTERPRETATION: 2 views of the right elbow including PA and lateral images were obtained under fluoroscopy Intra-Op.  These show revision open reduction intra-fixation of the olecranon nonunion with improved alignment of the articular surface as well as filling of the bone void with bone graft.  All screws and plate appear appear to be in appropriate positioning and length.  ESTIMATED BLOOD LOSS: 20 mL's  TOURNIQUET TIME: 1 hour 45 minutes  SPECIMENS: Aerobic and anaerobic cultures  POSTOPERATIVE PLAN: The patient will be discharged home and seen back  in the office in approximately 10-12 days for wound check, suture  removal, and then be sent to a therapist for for a long-arm splint and gentle range of motion.  Also work on getting him a bone stimulator to help with  his nonunion.  IMPLANTS: Acumed olecranon locking plate.

## 2019-03-31 NOTE — Anesthesia Procedure Notes (Signed)
Procedure Name: Intubation Date/Time: 03/31/2019 3:02 PM Performed by: Clearnce Sorrel, CRNA Pre-anesthesia Checklist: Patient identified, Emergency Drugs available, Suction available, Patient being monitored and Timeout performed Patient Re-evaluated:Patient Re-evaluated prior to induction Oxygen Delivery Method: Circle system utilized Preoxygenation: Pre-oxygenation with 100% oxygen Induction Type: IV induction Ventilation: Mask ventilation without difficulty Laryngoscope Size: Mac and 3 Grade View: Grade I Tube type: Oral Tube size: 7.5 mm Number of attempts: 1 Airway Equipment and Method: Stylet Placement Confirmation: ETT inserted through vocal cords under direct vision,  positive ETCO2 and breath sounds checked- equal and bilateral Secured at: 23 cm Tube secured with: Tape Dental Injury: Teeth and Oropharynx as per pre-operative assessment

## 2019-03-31 NOTE — H&P (Signed)
ORTHOPAEDIC H&P  PCP:  Sonia Side., FNP  Chief Complaint: Right elbow pain  HPI: Peter [name] is a 68 y.o. male who complains of right elbow pain.  He has a history of a prior right olecranon fracture which was treated with open reduction internal fixation.  He was doing well up until recently when he was seen back in clinic.  He noted some increased swelling around the posterior aspect of the elbow and repeat radiographs were obtained which showed loosening and displacement of this plate.  There was concern for nonunion of his fracture site.  I discussed treatment options with him and did recommend proceeding forward with revision open reduction internal fixation of his right olecranon nonunion and he presents today for that.  Past Medical History:  Diagnosis Date   Ambulates with cane    straight cane   Anemia    iron def.   Anxiety    no meds   Bone spur of other site    Left Foot   Cataract    surgery to remove   Chronic pain    Cirrhosis of liver (HCC)    Depression    no meds   Diabetes mellitus without complication ([place])    Patient Denies - No meds   Emphysema of lung ([place])    ETOH abuse    Glaucoma    Hard of hearing    Hyperlipemia    Hypertension    not on medication   Impaired mobility    uses cane   Pneumonia    x 1   Sinusitis    Stroke [facility])    weakness left side   Total self care deficit    Denies - Performs ADLs without assistance per patient 03/30/19   Past Surgical History:  Procedure Laterality Date   bone spur  2013   left spur   CATARACT EXTRACTION W/ INTRAOCULAR LENS IMPLANT Bilateral    COLONOSCOPY     ESOPHAGOGASTRODUODENOSCOPY  01/09/2012   Procedure: ESOPHAGOGASTRODUODENOSCOPY (EGD);  Surgeon: Beryle Beams, MD;  Location: [facility] ENDOSCOPY;  Service: Endoscopy;  Laterality: N/A;   IM NAILING TIBIA Right 04/16/2017   MOUTH SURGERY     MULTIPLE EXTRACTIONS WITH ALVEOLOPLASTY  03/29/2012   Procedure:  MULTIPLE EXTRACION WITH ALVEOLOPLASTY;  Surgeon: Gae Bon, DDS;  Location: [facility];  Service: Oral Surgery;  Laterality: Bilateral;   ORIF ELBOW FRACTURE Right 01/03/2019   Procedure: OPEN REDUCTION INTERNAL FIXATION (ORIF) ELBOW/OLECRANON FRACTURE;  Surgeon: [name], MD;  Location: [facility];  Service: Orthopedics;  Laterality: Right;   TIBIA IM NAIL INSERTION Right 04/16/2017   Procedure: INTRAMEDULLARY (IM) NAIL TIBIAL;  Surgeon: Leandrew Koyanagi, MD;  Location: [facility];  Service: Orthopedics;  Laterality: Right;   Social History   Socioeconomic History   Marital status: Legally Separated    Spouse name: Not on file   Number of children: 1   Years of education: Not on file   Highest education level: Not on file  Occupational History   Occupation: disabled  Social Designer, fashion/clothing strain: Not on file   Food insecurity    Worry: Not on file    Inability: Not on file   Transportation needs    Medical: Not on file    Non-medical: Not on file  Tobacco Use   Smoking status: Current Every Day Smoker    Packs/day: 1.00    Years: 30.00    Pack  years: 30.00    Types: Cigarettes   Smokeless tobacco: Never Used  Substance and Sexual Activity   Alcohol use: Yes    Alcohol/week: 7.0 standard drinks    Types: 7 Cans of beer per week   Drug use: Yes    Types: Cocaine    Comment: Last use 03/29/19   Sexual activity: Yes    Comment: no birth control  Lifestyle   Physical activity    Days per week: Not on file    Minutes per session: Not on file   Stress: Not on file  Relationships   Social connections    Talks on phone: Not on file    Gets together: Not on file    Attends religious service: Not on file    Active member of club or organization: Not on file    Attends meetings of clubs or organizations: Not on file    Relationship status: Not on file  Other Topics Concern   Not on file  Social History Narrative   Not on file   Family  History  Problem Relation Age of Onset   Breast cancer Mother    Diabetes Maternal Aunt    Heart disease Sister    Heart disease Maternal Aunt    Allergies  Allergen Reactions   Poison Ivy Extract [Poison Ivy Extract] Rash   Prior to Admission medications   Medication Sig Start Date End Date Taking? Authorizing Provider  albuterol (VENTOLIN HFA) 108 (90 Base) MCG/ACT inhaler Inhale 2 puffs into the lungs every 6 (six) hours as needed for wheezing or shortness of breath.   Yes [provider]  aspirin EC 81 MG tablet Take 81 mg by mouth daily.   Yes [provider]  atorvastatin (LIPITOR) 20 MG tablet Take 20 mg by mouth daily.   Yes [provider]  HYDROcodone-acetaminophen (NORCO/VICODIN) 5-325 MG tablet Take 1-2 tablets by mouth every 4 (four) hours as needed for moderate pain (pain score 4-6). Patient taking differently: Take 1-2 tablets by mouth every 6 (six) hours as needed for moderate pain (pain score 4-6).  01/03/19  Yes [name], MD  Multiple Vitamin (MULTIVITAMIN WITH MINERALS) TABS tablet Take 1 tablet by mouth daily.   Yes [provider]   Dg Chest 2 View  Result Date: 03/31/2019 CLINICAL DATA:  Preop chest x-ray. History of aspirated material on previous chest CT. EXAM: CHEST - 2 VIEW COMPARISON:  Chest CT dated December 22, 2016 FINDINGS: Healed rib fractures along the right lateral chest. Hazy opacity in the right upper lobe at the site of previous concern for aspirated material. Lungs are hyperinflated. No signs of dense consolidation or evidence of pleural effusion. Densities projecting over the spine on the lateral view likely reflect costovertebral degenerative changes. IMPRESSION: Given persistence of nodular opacity in the right upper chest, consider chest CT to exclude persistence/pulmonary neoplasm in this location. No signs of pneumonia or effusion. Electronically Signed   By: Zetta Bills M.D.   On: 03/31/2019 12:48      Positive ROS: All other systems have been reviewed and were otherwise negative with the exception of those mentioned in the HPI and as above.  Physical Exam: General: Alert, no acute distress Cardiovascular: No pedal edema Respiratory: No cyanosis, no use of accessory musculature Skin: No lesions in the area of chief complaint Psychiatric: Patient is competent for consent with normal mood and affect Lymphatic: No axillary or cervical lymphadenopathy  MUSCULOSKELETAL: Examination of the  right upper extremity shows well-healed surgical incision on the posterior aspect of the elbow. There is no swelling, erythema, lymphadenopathy or signs of infection. He has near full range of motion of the elbow with only aproximally 5 extension deficit. He has mild tenderness to the tip of the olecranon there is palpable prominence of the plate. His fingertips are all well perfused with brisk capillary refill. His sensation is intact light touch throughout all digits.  Assessment: Right olecranon nonunion  Plan: Plan for or today for revision open reduction and fixation of right olecranon fracture. Risks of surgery were discussed with the patient which include but are not limited to infection, bleeding, damage to surrounding structures include blood vessels and nerves, pain, stiffness, malunion, nonunion, implant failure and need for additional procedures.  Consent was obtained.  The right arm was marked. Plan for discharge home postoperatively.     [name], MD [phone]   03/31/2019 2:21 PM

## 2019-03-31 NOTE — Transfer of Care (Signed)
Immediate Anesthesia Transfer of Care Note  Patient: Peter [name]  Procedure(s) Performed: right olecranon nonunion revision open reduction, internal fixation and surgery as indicated (Right Elbow)  Patient Location: PACU  Anesthesia Type:General and Regional  Level of Consciousness: awake, alert  and oriented  Airway & Oxygen Therapy: Patient Spontanous Breathing and Patient connected to nasal cannula oxygen  Post-op Assessment: Report given to RN and Post -op Vital signs reviewed and stable  Post vital signs: Reviewed and stable  Last Vitals:  Vitals Value Taken Time  BP 158/79 03/31/19 1727  Temp 36.7 C 03/31/19 1726  Pulse 87 03/31/19 1731  Resp 18 03/31/19 1731  SpO2 100 % 03/31/19 1731  Vitals shown include unvalidated device data.  Last Pain:  Vitals:   03/31/19 1726  TempSrc:   PainSc: 0-No pain      Patients Stated Pain Goal: 3 ([id number])  Complications: No apparent anesthesia complications

## 2019-04-04 ENCOUNTER — Encounter (HOSPITAL_COMMUNITY): Payer: Self-pay | Admitting: Orthopaedic Surgery

## 2019-04-05 LAB — AEROBIC/ANAEROBIC CULTURE W GRAM STAIN (SURGICAL/DEEP WOUND)
Culture: NO GROWTH
Gram Stain: NONE SEEN

## 2019-04-06 ENCOUNTER — Telehealth (HOSPITAL_COMMUNITY): Payer: Self-pay | Admitting: Vascular Surgery

## 2019-04-06 NOTE — Progress Notes (Signed)
Anesthesia follow-up:  Dent Kelner underwent the following procedure on 03/31/19 by Dr. Jeannie Fend: SURGICAL PROCEDURES:  1.  Open reduction internal fixation of right olecranon nonunion 2.  Removal of deep hardware at the right olecranon   Anesthesiologist had requested preoperative CXR given 01/02/19 chest CT findings of aspirated material in his right mainstem bronchus extending into all three lobes on the right.  03/31/19 preoperative CXR showed: CLINICAL DATA:  Preop chest x-ray. History of aspirated material on previous chest CT.  EXAM: CHEST - 2 VIEW  COMPARISON:  Chest CT dated December 22, 2016  FINDINGS: Healed rib fractures along the right lateral chest.  Hazy opacity in the right upper lobe at the site of previous concern for aspirated material. Lungs are hyperinflated. No signs of dense consolidation or evidence of pleural effusion. Densities projecting over the spine on the lateral view likely reflect costovertebral degenerative changes.  IMPRESSION: Given persistence of nodular opacity in the right upper chest, consider chest CT to exclude persistence/pulmonary neoplasm in this location.  No signs of pneumonia or effusion.   I called and spoke with Mr. Ademola Spadaro this morning regarding results and that a chest CT was recommended to further evaluate right lung nodule in hopes to exclude presence of a lung cancer. He denied SOB and hemoptysis. I told his that this would have to be arranged by primary care. He confirmed that he sees Dr. Dustin Folks at [facility] at the [facility] location in [place], [place]. I asked him if he wanted me to review the results with another family member or close friend, but he said to just let Dr. Dustin Folks know. He says that he does not have an appointment currently scheduled with Dr. Tamala Julian, so I told him that I would also ask Dr. Thompson Caul office to contact him to schedule next steps (office visit and/or chest CT). I called  and spoke with triage nurse [place]") and reported the 03/31/19 CXR results. She will relay that message to Dr. Thompson Caul and/or his staff for follow-up. She provided the following fax number to send CXR report: [phone]. I will fax report until confirmation received.   Myra Gianotti, PA-C Surgical Short Stay/Anesthesiology [facility] Phone [phone] 04/06/2019 10:51 AM

## 2019-09-07 ENCOUNTER — Encounter (HOSPITAL_COMMUNITY): Payer: Self-pay | Admitting: Emergency Medicine

## 2019-09-07 ENCOUNTER — Emergency Department (HOSPITAL_COMMUNITY)
Admission: EM | Admit: 2019-09-07 | Discharge: 2019-09-08 | Disposition: A | Discharge status: Home / Self Care | Payer: Medicare Other | Attending: Emergency Medicine | Admitting: Emergency Medicine

## 2019-09-07 DIAGNOSIS — R319 Hematuria, unspecified: Secondary | ICD-10-CM | POA: Diagnosis present

## 2019-09-07 DIAGNOSIS — F17210 Nicotine dependence, cigarettes, uncomplicated: Secondary | ICD-10-CM | POA: Diagnosis not present

## 2019-09-07 DIAGNOSIS — I69354 Hemiplegia and hemiparesis following cerebral infarction affecting left non-dominant side: Secondary | ICD-10-CM | POA: Insufficient documentation

## 2019-09-07 DIAGNOSIS — Z79899 Other long term (current) drug therapy: Secondary | ICD-10-CM | POA: Diagnosis not present

## 2019-09-07 DIAGNOSIS — R310 Gross hematuria: Secondary | ICD-10-CM | POA: Insufficient documentation

## 2019-09-07 DIAGNOSIS — I509 Heart failure, unspecified: Secondary | ICD-10-CM | POA: Diagnosis not present

## 2019-09-07 DIAGNOSIS — Z7982 Long term (current) use of aspirin: Secondary | ICD-10-CM | POA: Insufficient documentation

## 2019-09-07 LAB — URINALYSIS, ROUTINE W REFLEX MICROSCOPIC
Bilirubin Urine: NEGATIVE
Glucose, UA: NEGATIVE mg/dL
Ketones, ur: NEGATIVE mg/dL
Nitrite: POSITIVE — AB
Protein, ur: NEGATIVE mg/dL
RBC / HPF: >50 RBC/hpf — ABNORMAL HIGH (ref 0–5)
Specific Gravity, Urine: 1.009 (ref 1.005–1.030)
pH: 7 (ref 5.0–8.0)

## 2019-09-07 LAB — COMPREHENSIVE METABOLIC PANEL
ALT: 12 U/L (ref 0–44)
AST: 18 U/L (ref 15–41)
Albumin: 3.7 g/dL (ref 3.5–5.0)
Alkaline Phosphatase: 58 U/L (ref 38–126)
Anion gap: 12 (ref 5–15)
BUN: 14 mg/dL (ref 8–23)
CO2: 22 mmol/L (ref 22–32)
Calcium: 9.1 mg/dL (ref 8.9–10.3)
Chloride: 96 mmol/L — ABNORMAL LOW (ref 98–111)
Creatinine, Ser: 1.24 mg/dL (ref 0.61–1.24)
GFR calc Af Amer: >60 mL/min (ref >60)
GFR calc non Af Amer: >60 mL/min (ref >60)
Glucose, Bld: 100 mg/dL — ABNORMAL HIGH (ref 70–99)
Potassium: 3.8 mmol/L (ref 3.5–5.1)
Sodium: 130 mmol/L — ABNORMAL LOW (ref 135–145)
Total Bilirubin: 0.6 mg/dL (ref 0.3–1.2)
Total Protein: 7 g/dL (ref 6.5–8.1)

## 2019-09-07 LAB — CBC
HCT: 36.2 % — ABNORMAL LOW (ref 39.0–52.0)
Hemoglobin: 11.8 g/dL — ABNORMAL LOW (ref 13.0–17.0)
MCH: 24.3 pg — ABNORMAL LOW (ref 26.0–34.0)
MCHC: 32.6 g/dL (ref 30.0–36.0)
MCV: 74.5 fL — ABNORMAL LOW (ref 80.0–100.0)
Platelets: 313 10*3/uL (ref 150–400)
RBC: 4.86 MIL/uL (ref 4.22–5.81)
RDW: 16 % — ABNORMAL HIGH (ref 11.5–15.5)
WBC: 4.5 10*3/uL (ref 4.0–10.5)
nRBC: 0 % (ref 0.0–0.2)

## 2019-09-07 NOTE — ED Triage Notes (Signed)
Pt here with c/o blood in his urine that started today one episode pt use to be on blood thinners ,  No burning or upon urination or discharge

## 2019-09-08 MED ORDER — CEPHALEXIN 500 MG PO CAPS: 500.0000 mg | ORAL_CAPSULE | Freq: Two times a day (BID) | ORAL | 0 refills | Status: DC

## 2019-09-08 NOTE — ED Provider Notes (Signed)
[facility] EMERGENCY DEPARTMENT Provider Note   CSN: [id number] Arrival date & time: 09/07/19  1913     History  Chief Complaint -hematuria  Peter [name] is a 69 y.o. male.  The history is provided by the patient.  Hematuria This is a new problem. The current episode started 12 to 24 hours ago. The problem occurs constantly. The problem has been resolved. Pertinent negatives include no abdominal pain. Nothing aggravates the symptoms. Nothing relieves the symptoms.   Patient reports hematuria that started over the past day.  He reports it is now improving.  He denies dysuria.  No other acute complaints    Past Medical History:  Diagnosis Date  . Ambulates with cane    straight cane  . Anemia    iron def.  . Anxiety    no meds  . Bone spur of other site    Left Foot  . Cataract    surgery to remove  . Chronic pain   . Cirrhosis of liver ([place])   . Depression    no meds  . Diabetes mellitus without complication [facility])    Patient Denies - No meds  . Emphysema of lung ([place])   . ETOH abuse   . Glaucoma   . Hard of hearing   . Hyperlipemia   . Hypertension    not on medication  . Impaired mobility    uses cane  . Pneumonia    x 1  . Sinusitis   . Stroke [facility])    weakness left side  . Total self care deficit    Denies - Performs ADLs without assistance per patient 03/30/19    Patient Active Problem List   Diagnosis Date Noted  . Olecranon fracture, right, closed, initial encounter 01/02/2019  . Closed fracture of right olecranon process 01/02/2019  . Pain in right leg 09/25/2017  . Aortic atherosclerosis ([place]) 04/21/2017  . Iron deficiency anemia 04/21/2017  . Nodule of upper lobe of right lung 04/21/2017  . Closed fracture of right fibula and tibia   . Displaced fracture of fifth metatarsal bone, right foot, initial encounter for closed fracture   . Postoperative fever 04/19/2017  . Displaced oblique fracture of shaft of right tibia,  initial encounter for closed fracture 04/16/2017  . Closed fracture of lateral portion of right tibial plateau 04/16/2017  . Tibial fracture 04/16/2017  . Hypotension, postural 01/05/2015  . UTI (lower urinary tract infection) 01/05/2015  . Acute renal failure ([place]) 01/05/2015  . ARF (acute renal failure) ([place]) 01/05/2015  . Closed nondisplaced fracture of greater trochanter of right femur with routine healing 11/23/2014  . Alcohol withdrawal ([place]) 11/21/2014  . Acute confusional state 11/21/2014  . Sepsis ([place]) 05/25/2014  . Fever 05/25/2014  . Malnutrition of moderate degree ([place]) 05/25/2014  . Alcohol abuse   . Smoker 12/15/2013  . Acute respiratory failure ([place]) 10/28/2013  . Delirium tremens ([place]) 10/28/2013  . Acute respiratory distress syndrome (ARDS) ([place]) 10/28/2013  . Systolic CHF ([place]) [id number]  . Tobacco abuse 10/27/2013  . HCAP (healthcare-associated pneumonia) 10/25/2013  . Weakness 08/01/2013  . Dizziness 04/06/2013  . Chronic alcohol abuse 02/16/2013  . Liver cirrhosis, alcoholic ([place]) [id number]  . Depression (emotion) 02/16/2013  . COPD, moderate ([place]) 02/16/2013  . Aspiration pneumonia ([place]) 01/16/2013  . UTI (urinary tract infection) 01/16/2013  . Hypokalemia 01/15/2013  . Hypomagnesemia 01/15/2013  . Fever, unspecified 01/15/2013  . Leukopenia 01/13/2013  . Orthostatic hypotension 01/13/2013  . Alcohol  intoxication ([place]) 01/12/2013  . Syncope 01/12/2013  . Lactic acidosis 01/12/2013  . Cirrhosis ([place]) 01/12/2013  . History of stroke 01/12/2013  . Acute alcohol intoxication ([place]) 08/30/2012  . Iron deficiency 02/04/2012  . Hyponatremia 01/31/2012  . COPD (chronic obstructive pulmonary disease) ([place]) 01/03/2012    Past Surgical History:  Procedure Laterality Date  . bone spur  2013   left spur  . CATARACT EXTRACTION W/ INTRAOCULAR LENS IMPLANT Bilateral   . COLONOSCOPY    . ESOPHAGOGASTRODUODENOSCOPY  01/09/2012   Procedure:  ESOPHAGOGASTRODUODENOSCOPY (EGD);  Surgeon: Beryle Beams, MD;  Location: [facility] ENDOSCOPY;  Service: Endoscopy;  Laterality: N/A;  . IM NAILING TIBIA Right 04/16/2017  . MOUTH SURGERY    . MULTIPLE EXTRACTIONS WITH ALVEOLOPLASTY  03/29/2012   Procedure: MULTIPLE EXTRACION WITH ALVEOLOPLASTY;  Surgeon: Gae Bon, DDS;  Location: [place];  Service: Oral Surgery;  Laterality: Bilateral;  . ORIF ELBOW FRACTURE Right 01/03/2019   Procedure: OPEN REDUCTION INTERNAL FIXATION (ORIF) ELBOW/OLECRANON FRACTURE;  Surgeon: [name], MD;  Location: [place];  Service: Orthopedics;  Laterality: Right;  . ORIF ELBOW FRACTURE Right 03/31/2019   Procedure: right olecranon nonunion revision open reduction, internal fixation and surgery as indicated;  Surgeon: [name], MD;  Location: [place];  Service: Orthopedics;  Laterality: Right;  25min  . TIBIA IM NAIL INSERTION Right 04/16/2017   Procedure: INTRAMEDULLARY (IM) NAIL TIBIAL;  Surgeon: Leandrew Koyanagi, MD;  Location: [place];  Service: Orthopedics;  Laterality: Right;       Family History  Problem Relation Age of Onset  . Breast cancer Mother   . Diabetes Maternal Aunt   . Heart disease Sister   . Heart disease Maternal Aunt     Social History   Tobacco Use  . Smoking status: Current Every Day Smoker    Packs/day: 1.00    Years: 30.00    Pack years: 30.00    Types: Cigarettes  . Smokeless tobacco: Never Used  Substance Use Topics  . Alcohol use: Yes    Alcohol/week: 7.0 standard drinks    Types: 7 Cans of beer per week  . Drug use: Yes    Types: Cocaine    Comment: Last use 03/29/19    Home Medications Prior to Admission medications   Medication Sig Start Date End Date Taking? Authorizing Provider  albuterol (VENTOLIN HFA) 108 (90 Base) MCG/ACT inhaler Inhale 2 puffs into the lungs every 6 (six) hours as needed for wheezing or shortness of breath.    [provider]  aspirin EC 81 MG tablet Take 81 mg by mouth  daily.    [provider]  atorvastatin (LIPITOR) 20 MG tablet Take 20 mg by mouth daily.    [provider]  Multiple Vitamin (MULTIVITAMIN WITH MINERALS) TABS tablet Take 1 tablet by mouth daily.    [provider]    Allergies    Poison ivy extract [poison ivy extract]  Review of Systems   Review of Systems  Constitutional: Negative for fever.  Gastrointestinal: Negative for abdominal pain.  Genitourinary: Positive for hematuria. Negative for dysuria.    Physical Exam Updated Vital Signs BP 103/76 (BP Location: Right Arm)   Pulse 85   Temp 98.3 F (36.8 C) (Oral)   Resp 18   SpO2 98%   Physical Exam CONSTITUTIONAL: Well developed/well nourished, sleeping HEAD: Normocephalic/atraumatic EYES: EOMI ENMT: Mucous membranes moist NECK: supple no meningeal signs LUNGS:  no apparent distress ABDOMEN:  soft NEURO: Pt is sleeping but arousable.  Answers all questions appropriate.  Moves all extremities x4 EXTREMITIES:  full ROM SKIN: warm, color normal PSYCH: no abnormalities of mood noted, alert and oriented to situation  ED Results / Procedures / Treatments   Labs (all labs ordered are listed, but only abnormal results are displayed) Labs Reviewed  CBC - Abnormal; Notable for the following components:      Result Value   Hemoglobin 11.8 (*)    HCT 36.2 (*)    MCV 74.5 (*)    MCH 24.3 (*)    RDW 16.0 (*)    All other components within normal limits  COMPREHENSIVE METABOLIC PANEL - Abnormal; Notable for the following components:   Sodium 130 (*)    Chloride 96 (*)    Glucose, Bld 100 (*)    All other components within normal limits  URINALYSIS, ROUTINE W REFLEX MICROSCOPIC - Abnormal; Notable for the following components:   Hgb urine dipstick LARGE (*)    Nitrite POSITIVE (*)    Leukocytes,Ua SMALL (*)    RBC / HPF >50 (*)    Bacteria, UA MANY (*)    All other components within normal limits  URINE CULTURE    EKG None  Radiology  No results found.  Procedures Procedures  Medications Ordered in ED Medications - No data to display  ED Course  I have reviewed the triage vital signs and the nursing notes.  Pertinent labs  results that were available during my care of the patient were reviewed by me and considered in my medical decision making (see chart for details).    MDM Rules/Calculators/A&P                     5:46 AM Patient presented for hematuria.  At the time of my evaluation he reports all the symptoms have resolved.  He denies dysuria.  He reports hematuria is now improving. He is in no acute distress, he is not septic appearing.  Urine culture will be sent.  We will start him on Keflex twice daily for a week He will then follow with his PCP in the next 2 weeks for reevaluation.  If the hematuria continues he may need further investigation with urology Final Clinical Impression(s) / ED Diagnoses Final diagnoses:  Gross hematuria    Rx / DC Orders ED Discharge Orders         Ordered    cephALEXin (KEFLEX) 500 MG capsule  2 times daily     09/08/19 0544           Ripley Fraise, MD 09/08/19 [phone]

## 2019-09-10 LAB — URINE CULTURE: Culture: >=100000 — AB

## 2019-09-11 ENCOUNTER — Telehealth: Payer: Self-pay | Admitting: Emergency Medicine

## 2019-09-11 NOTE — Telephone Encounter (Signed)
Post ED Visit - Positive Culture Follow-up: Unsuccessful Patient Follow-up  Culture assessed and recommendations reviewed by:  []  Elenor Quinones, Pharm.D. []  Heide Guile, Pharm.D., BCPS AQ-ID []  Parks Neptune, Pharm.D., BCPS []  Alycia Rossetti, Pharm.D., BCPS []  [place], Pharm.D., BCPS, AAHIVP []  Legrand Como, Pharm.D., BCPS, AAHIVP [x]  Salome Arnt, PharmD []  Vincenza Hews, PharmD, BCPS  Positive urine culture  []  Patient discharged without antimicrobial prescription and treatment is now indicated [x]  Organism is resistant to prescribed ED discharge antimicrobial []  Patient with positive blood cultures   Unable to contact patient at phone numbers on file, letter will be sent to address on file  Peter [name] 09/11/2019, 2:21 PM

## 2019-09-11 NOTE — Progress Notes (Signed)
ED Antimicrobial Stewardship Positive Culture Follow Up   Peter [name] is an 69 y.o. male who presented to [facility] on 09/07/2019 with a chief complaint of No chief complaint on file.   Recent Results (from the past 720 hour(s))  Urine culture     Status: Abnormal   Collection Time: 09/08/19  5:13 AM   Specimen: Urine, Random  Result Value Ref Range Status   Specimen Description URINE, RANDOM  Final   Special Requests   Final    NONE Performed at [facility], [address] [place], [place] [address]    Culture (A)  Final    >=100,000 COLONIES/mL ENTEROCOCCUS FAECALIS >=100,000 COLONIES/mL VIRIDANS STREPTOCOCCUS    Report Status 09/10/2019 FINAL  Final   Organism ID, Bacteria ENTEROCOCCUS FAECALIS (A)  Final      Susceptibility   Enterococcus faecalis - MIC*    AMPICILLIN <=2 SENSITIVE Sensitive     NITROFURANTOIN <=16 SENSITIVE Sensitive     VANCOMYCIN 1 SENSITIVE Sensitive     * >=100,000 COLONIES/mL ENTEROCOCCUS FAECALIS    [x]  Treated with cephalexin, enterococcus is resistant to prescribed antimicrobial []  Patient discharged originally without antimicrobial agent and treatment is now indicated  New antibiotic prescription: DC cephalexin, start amoxicillin 875mg  PO BID x 5 days  ED Provider: Shelby Dubin, PA-C   Grisell Bissette, Rande Lawman 09/11/2019, 10:00 AM Clinical Pharmacist Monday - Friday phone -  [phone] Saturday - Sunday phone - [phone]

## 2021-08-17 ENCOUNTER — Emergency Department (HOSPITAL_COMMUNITY): Payer: Medicare Other

## 2021-08-17 ENCOUNTER — Encounter (HOSPITAL_COMMUNITY): Payer: Self-pay

## 2021-08-17 ENCOUNTER — Other Ambulatory Visit: Payer: Self-pay

## 2021-08-17 ENCOUNTER — Emergency Department (HOSPITAL_COMMUNITY)
Admission: EM | Admit: 2021-08-17 | Discharge: 2021-08-17 | Disposition: A | Discharge status: Home / Self Care | Payer: Medicare Other | Attending: Emergency Medicine | Admitting: Emergency Medicine

## 2021-08-17 DIAGNOSIS — Z7982 Long term (current) use of aspirin: Secondary | ICD-10-CM | POA: Insufficient documentation

## 2021-08-17 DIAGNOSIS — J189 Pneumonia, unspecified organism: Secondary | ICD-10-CM | POA: Diagnosis not present

## 2021-08-17 DIAGNOSIS — M7989 Other specified soft tissue disorders: Secondary | ICD-10-CM | POA: Diagnosis not present

## 2021-08-17 DIAGNOSIS — R059 Cough, unspecified: Secondary | ICD-10-CM | POA: Diagnosis present

## 2021-08-17 HISTORY — DX: Pneumonia, unspecified organism: J18.9

## 2021-08-17 LAB — BASIC METABOLIC PANEL
Anion gap: 9 (ref 5–15)
BUN: 15 mg/dL (ref 8–23)
CO2: 25 mmol/L (ref 22–32)
Calcium: 8.8 mg/dL — ABNORMAL LOW (ref 8.9–10.3)
Chloride: 102 mmol/L (ref 98–111)
Creatinine, Ser: 0.94 mg/dL (ref 0.61–1.24)
GFR, Estimated: >60 mL/min (ref >60)
Glucose, Bld: 107 mg/dL — ABNORMAL HIGH (ref 70–99)
Potassium: 4.2 mmol/L (ref 3.5–5.1)
Sodium: 136 mmol/L (ref 135–145)

## 2021-08-17 LAB — CBC WITH DIFFERENTIAL/PLATELET
Abs Immature Granulocytes: 0.03 10*3/uL (ref 0.00–0.07)
Basophils Absolute: 0 10*3/uL (ref 0.0–0.1)
Basophils Relative: 0 %
Eosinophils Absolute: 0.1 10*3/uL (ref 0.0–0.5)
Eosinophils Relative: 1 %
HCT: 37.5 % — ABNORMAL LOW (ref 39.0–52.0)
Hemoglobin: 11.8 g/dL — ABNORMAL LOW (ref 13.0–17.0)
Immature Granulocytes: 0 %
Lymphocytes Relative: 24 %
Lymphs Abs: 1.7 10*3/uL (ref 0.7–4.0)
MCH: 24.1 pg — ABNORMAL LOW (ref 26.0–34.0)
MCHC: 31.5 g/dL (ref 30.0–36.0)
MCV: 76.7 fL — ABNORMAL LOW (ref 80.0–100.0)
Monocytes Absolute: 0.6 10*3/uL (ref 0.1–1.0)
Monocytes Relative: 9 %
Neutro Abs: 4.5 10*3/uL (ref 1.7–7.7)
Neutrophils Relative %: 66 %
Platelets: 464 10*3/uL — ABNORMAL HIGH (ref 150–400)
RBC: 4.89 MIL/uL (ref 4.22–5.81)
RDW: 17.2 % — ABNORMAL HIGH (ref 11.5–15.5)
WBC: 6.9 10*3/uL (ref 4.0–10.5)
nRBC: 0 % (ref 0.0–0.2)

## 2021-08-17 MED ORDER — ACETAMINOPHEN 325 MG PO TABS
650.0000 mg | ORAL_TABLET | Freq: Once | ORAL | Status: AC
  Administered 2021-08-17: 650 mg via ORAL
  Filled 2021-08-17: qty 2

## 2021-08-17 MED ORDER — STERILE WATER FOR INJECTION IJ SOLN
INTRAMUSCULAR | Status: AC
  Administered 2021-08-17: 2 mL
  Filled 2021-08-17: qty 10

## 2021-08-17 MED ORDER — DEXAMETHASONE 4 MG PO TABS
6.0000 mg | ORAL_TABLET | Freq: Once | ORAL | Status: AC
  Administered 2021-08-17: 6 mg via ORAL
  Filled 2021-08-17: qty 1

## 2021-08-17 MED ORDER — CEFTRIAXONE SODIUM 1 G IJ SOLR
1.0000 g | Freq: Once | INTRAMUSCULAR | Status: AC
  Administered 2021-08-17: 1 g via INTRAMUSCULAR
  Filled 2021-08-17: qty 10

## 2021-08-17 MED ORDER — AZITHROMYCIN 250 MG PO TABS: 250.0000 mg | ORAL_TABLET | Freq: Every day | ORAL | 0 refills | Status: DC

## 2021-08-17 NOTE — Discharge Instructions (Signed)
Call your primary care doctor or specialist as discussed in the next 2-3 days.   Return immediately back to the ER if:  Your symptoms worsen within the next 12-24 hours. You develop new symptoms such as new fevers, persistent vomiting, new pain, shortness of breath, or new weakness or numbness, or if you have any other concerns.  

## 2021-08-17 NOTE — ED Provider Triage Note (Signed)
Emergency Medicine Provider Triage Evaluation Note  Peter [name] , a 71 y.o. male  was evaluated in triage.  Pt complains of bilateral hand swelling since this morning with pain. Denies chest pain, shob, swelling anywhere else in body. Denies known injury to hands. Non-compliant with eliquis, unsure what he was prescribed this medication for.  Review of Systems  Positive: Hand swelling Negative: Chest pain, shortness of breath, LE edema  Physical Exam  BP 105/62 (BP Location: Left Arm)    Pulse (!) 113    Temp 98.5 F (36.9 C) (Oral)    Resp 17    SpO2 93%  Gen:   Awake, no distress   Resp:  Normal effort  MSK:   Moves extremities without difficulty  Other:  Bilateral swelling of hands, left maybe slightly greater than right and with more redness  Medical Decision Making  Medically screening exam initiated at 2:19 PM.  Appropriate orders placed.  [name] was informed that the remainder of the evaluation will be completed by another provider, this initial triage assessment does not replace that evaluation, and the importance of remaining in the ED until their evaluation is complete.  Workup initiated   Anselmo Pickler, [place] 08/17/21 1420

## 2021-08-17 NOTE — ED Provider Notes (Signed)
[place] DEPT Provider Note   CSN: [id number] Arrival date & time: 08/17/21  1339     History  Chief Complaint  Patient presents with   Hand Problem    Peter [name] is a 71 y.o. male.  Patient presents ER chief complaint of bilateral hand swelling.  He has noticed it this morning.  Denies any fall or trauma or exposure to any material that he can recall.  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  Denies vomiting or diarrhea.  No headache no chest pain no abdominal pain.  He states he does have a 2-day history of cough.  Denies fevers.      Home Medications Prior to Admission medications   Medication Sig Start Date End Date Taking? Authorizing Provider  azithromycin (ZITHROMAX) 250 MG tablet Take 1 tablet (250 mg total) by mouth daily. Take first 2 tablets together, then 1 every day until finished. 08/17/21  Yes Luna Fuse, MD  albuterol (VENTOLIN HFA) 108 (90 Base) MCG/ACT inhaler Inhale 2 puffs into the lungs every 6 (six) hours as needed for wheezing or shortness of breath.    [provider]  aspirin EC 81 MG tablet Take 81 mg by mouth daily.    [provider]  atorvastatin (LIPITOR) 20 MG tablet Take 20 mg by mouth daily.    [provider]  cephALEXin (KEFLEX) 500 MG capsule Take 1 capsule (500 mg total) by mouth 2 (two) times daily. 09/08/19   Ripley Fraise, MD  Multiple Vitamin (MULTIVITAMIN WITH MINERALS) TABS tablet Take 1 tablet by mouth daily.    [provider]      Allergies    Poison ivy extract [poison ivy extract]    Review of Systems   Review of Systems  Constitutional:  Negative for fever.  HENT:  Negative for ear pain and sore throat.   Eyes:  Negative for pain.  Respiratory:  Positive for cough.   Cardiovascular:  Negative for chest pain.  Gastrointestinal:  Negative for abdominal pain.  Genitourinary:  Negative for flank pain.  Musculoskeletal:  Negative for back  pain.  Skin:  Negative for color change and rash.  Neurological:  Negative for syncope.  All other systems reviewed and are negative.  Physical Exam Updated Vital Signs BP (!) 167/91    Pulse 93    Temp 98.5 F (36.9 C) (Oral)    Resp 15    Ht 5\' 7"  (1.702 m)    Wt 63.5 kg    SpO2 98%    BMI 21.93 kg/m  Physical Exam Constitutional:      Appearance: He is well-developed.  HENT:     Head: Normocephalic.     Nose: Nose normal.  Eyes:     Extraocular Movements: Extraocular movements intact.  Cardiovascular:     Rate and Rhythm: Normal rate.  Pulmonary:     Effort: Pulmonary effort is normal.  Musculoskeletal:     Comments: Moderate swelling present to all 5 digits of bilateral hands.  Swelling appears to start from the wrist and extend distally.  He is able to flex and extend his hands.  There is no evidence of sausage digits.  I do not see any wounds or lacerations.  No abnormal warmth palpated.  Pulses are 2+ radial pulses.  Cap refill is normal all fingertips.  No erythema noted on skin exam.  Some tenderness on the dorsum of the left hand noted.  Skin:  Coloration: Skin is not jaundiced.  Neurological:     Mental Status: He is alert. Mental status is at baseline.    ED Results / Procedures / Treatments   Labs (all labs ordered are listed, but only abnormal results are displayed) Labs Reviewed  CBC WITH DIFFERENTIAL/PLATELET - Abnormal; Notable for the following components:      Result Value   Hemoglobin 11.8 (*)    HCT 37.5 (*)    MCV 76.7 (*)    MCH 24.1 (*)    RDW 17.2 (*)    Platelets 464 (*)    All other components within normal limits  BASIC METABOLIC PANEL - Abnormal; Notable for the following components:   Glucose, Bld 107 (*)    Calcium 8.8 (*)    All other components within normal limits    EKG EKG Interpretation  Date/Time:  Saturday August 17 2021 14:38:53 EST Ventricular Rate:  107 PR Interval:  126 QRS Duration: 81 QT Interval:  342 QTC  Calculation: 457 R Axis:   84 Text Interpretation: Sinus tachycardia Borderline right axis deviation Confirmed by Thamas Jaegers ([id number]) on 08/17/2021 8:30:02 PM  Radiology DG Chest 2 View  Result Date: 08/17/2021 CLINICAL DATA:  Shortness of breath EXAM: CHEST - 2 VIEW COMPARISON:  03/31/2019 FINDINGS: Heart size is normal. Aortic atherosclerosis. Patchy airspace opacity within the right lower lobe. Lungs are hyperinflated. Left lung appears clear. No pleural effusion or pneumothorax. Chronic deformity of the proximal left humerus. IMPRESSION: Patchy airspace opacity within the right lower lobe suspicious for pneumonia. Radiographic follow-up to resolution is recommended. Electronically Signed   By: Davina Poke D.O.   On: 08/17/2021 15:30    Procedures Procedures    Medications Ordered in ED Medications  cefTRIAXone (ROCEPHIN) injection 1 g (1 g Intramuscular Given 08/17/21 2117)  dexamethasone (DECADRON) tablet 6 mg (6 mg Oral Given 08/17/21 2117)  acetaminophen (TYLENOL) tablet 650 mg (650 mg Oral Given 08/17/21 2117)  sterile water (preservative free) injection (2 mLs  Given 08/17/21 2121)    ED Course/ Medical Decision Making/ A&P                           Medical Decision Making Risk OTC drugs. Prescription drug management.   Review of records show recent surgery of the right olecranon March 31, 2019.  Work-up today is unremarkable white count normal chemistry normal.  Chest x-ray however concerning for pneumonia.  Given his history of cough I suspect this is true pneumonia.  Patient given Rocephin here in the ER and will get be given a prescription of azithromycin.  Steroids provided to help facilitate some decrease swelling of bilateral hands.  Etiology of his hand swelling unclear.  Given distribution I doubt septic joint.  No evidence of cellulitis.  Arthritic swelling considered the patient has no prior history of this.  I feel he will benefit from a trial of steroids  and outpatient follow-up with his doctor this week.  Patient advised return for worsening symptoms fevers or any additional concerns.         Final Clinical Impression(s) / ED Diagnoses Final diagnoses:  Community acquired pneumonia, unspecified laterality  Swelling of both hands    Rx / DC Orders ED Discharge Orders          Ordered    azithromycin (ZITHROMAX) 250 MG tablet  Daily        08/17/21 2152  Luna Fuse, MD 08/17/21 2152

## 2021-08-17 NOTE — ED Triage Notes (Signed)
Per EMS, patient from home, swelling to L hand today. Noncompliant with eliquis. Patient unsure of why blood thinner is prescribed. Denies trauma or injury.   BP 130/76 HR 106 RR 20 O2 97% RA CBG 132

## 2021-09-12 ENCOUNTER — Encounter (HOSPITAL_COMMUNITY): Payer: Self-pay | Admitting: Radiology

## 2021-09-26 ENCOUNTER — Emergency Department (HOSPITAL_COMMUNITY): Payer: Medicare Other

## 2021-09-26 ENCOUNTER — Inpatient Hospital Stay (HOSPITAL_COMMUNITY)
Admission: EM | Admit: 2021-09-26 | Discharge: 2021-09-29 | DRG: 177 | Disposition: A | Discharge status: Home / Self Care | Payer: Medicare Other | Attending: Internal Medicine | Admitting: Internal Medicine

## 2021-09-26 DIAGNOSIS — Z961 Presence of intraocular lens: Secondary | ICD-10-CM | POA: Diagnosis present

## 2021-09-26 DIAGNOSIS — E785 Hyperlipidemia, unspecified: Secondary | ICD-10-CM | POA: Diagnosis present

## 2021-09-26 DIAGNOSIS — E860 Dehydration: Secondary | ICD-10-CM | POA: Diagnosis present

## 2021-09-26 DIAGNOSIS — Z833 Family history of diabetes mellitus: Secondary | ICD-10-CM

## 2021-09-26 DIAGNOSIS — G9341 Metabolic encephalopathy: Secondary | ICD-10-CM | POA: Diagnosis present

## 2021-09-26 DIAGNOSIS — Z20822 Contact with and (suspected) exposure to covid-19: Secondary | ICD-10-CM | POA: Diagnosis present

## 2021-09-26 DIAGNOSIS — Z9109 Other allergy status, other than to drugs and biological substances: Secondary | ICD-10-CM

## 2021-09-26 DIAGNOSIS — F17210 Nicotine dependence, cigarettes, uncomplicated: Secondary | ICD-10-CM | POA: Diagnosis present

## 2021-09-26 DIAGNOSIS — Y904 Blood alcohol level of 80-99 mg/100 ml: Secondary | ICD-10-CM | POA: Diagnosis present

## 2021-09-26 DIAGNOSIS — Z8673 Personal history of transient ischemic attack (TIA), and cerebral infarction without residual deficits: Secondary | ICD-10-CM

## 2021-09-26 DIAGNOSIS — Z9841 Cataract extraction status, right eye: Secondary | ICD-10-CM

## 2021-09-26 DIAGNOSIS — N400 Enlarged prostate without lower urinary tract symptoms: Secondary | ICD-10-CM | POA: Diagnosis present

## 2021-09-26 DIAGNOSIS — N179 Acute kidney failure, unspecified: Secondary | ICD-10-CM | POA: Diagnosis present

## 2021-09-26 DIAGNOSIS — I10 Essential (primary) hypertension: Secondary | ICD-10-CM | POA: Diagnosis present

## 2021-09-26 DIAGNOSIS — F10929 Alcohol use, unspecified with intoxication, unspecified: Secondary | ICD-10-CM | POA: Diagnosis present

## 2021-09-26 DIAGNOSIS — J449 Chronic obstructive pulmonary disease, unspecified: Secondary | ICD-10-CM | POA: Diagnosis present

## 2021-09-26 DIAGNOSIS — E119 Type 2 diabetes mellitus without complications: Secondary | ICD-10-CM | POA: Diagnosis present

## 2021-09-26 DIAGNOSIS — J690 Pneumonitis due to inhalation of food and vomit: Principal | ICD-10-CM | POA: Diagnosis present

## 2021-09-26 DIAGNOSIS — F419 Anxiety disorder, unspecified: Secondary | ICD-10-CM | POA: Diagnosis present

## 2021-09-26 DIAGNOSIS — R4182 Altered mental status, unspecified: Principal | ICD-10-CM

## 2021-09-26 DIAGNOSIS — Z9842 Cataract extraction status, left eye: Secondary | ICD-10-CM

## 2021-09-26 DIAGNOSIS — Z720 Tobacco use: Secondary | ICD-10-CM | POA: Diagnosis present

## 2021-09-26 DIAGNOSIS — K7460 Unspecified cirrhosis of liver: Secondary | ICD-10-CM | POA: Diagnosis present

## 2021-09-26 DIAGNOSIS — G8929 Other chronic pain: Secondary | ICD-10-CM | POA: Diagnosis present

## 2021-09-26 DIAGNOSIS — J439 Emphysema, unspecified: Secondary | ICD-10-CM | POA: Diagnosis present

## 2021-09-26 DIAGNOSIS — H409 Unspecified glaucoma: Secondary | ICD-10-CM | POA: Diagnosis present

## 2021-09-26 DIAGNOSIS — D509 Iron deficiency anemia, unspecified: Secondary | ICD-10-CM | POA: Diagnosis present

## 2021-09-26 DIAGNOSIS — Z7982 Long term (current) use of aspirin: Secondary | ICD-10-CM

## 2021-09-26 DIAGNOSIS — Z79899 Other long term (current) drug therapy: Secondary | ICD-10-CM

## 2021-09-26 DIAGNOSIS — D649 Anemia, unspecified: Secondary | ICD-10-CM

## 2021-09-26 DIAGNOSIS — F10129 Alcohol abuse with intoxication, unspecified: Secondary | ICD-10-CM | POA: Diagnosis present

## 2021-09-26 DIAGNOSIS — F32A Depression, unspecified: Secondary | ICD-10-CM | POA: Diagnosis present

## 2021-09-26 DIAGNOSIS — Z8249 Family history of ischemic heart disease and other diseases of the circulatory system: Secondary | ICD-10-CM

## 2021-09-26 LAB — CBC WITH DIFFERENTIAL/PLATELET
Abs Immature Granulocytes: 0.04 10*3/uL (ref 0.00–0.07)
Basophils Absolute: 0 10*3/uL (ref 0.0–0.1)
Basophils Relative: 0 %
Eosinophils Absolute: 0 10*3/uL (ref 0.0–0.5)
Eosinophils Relative: 0 %
HCT: 29.3 % — ABNORMAL LOW (ref 39.0–52.0)
Hemoglobin: 8.9 g/dL — ABNORMAL LOW (ref 13.0–17.0)
Immature Granulocytes: 0 %
Lymphocytes Relative: 13 %
Lymphs Abs: 1.2 10*3/uL (ref 0.7–4.0)
MCH: 23.7 pg — ABNORMAL LOW (ref 26.0–34.0)
MCHC: 30.4 g/dL (ref 30.0–36.0)
MCV: 77.9 fL — ABNORMAL LOW (ref 80.0–100.0)
Monocytes Absolute: 0.5 10*3/uL (ref 0.1–1.0)
Monocytes Relative: 5 %
Neutro Abs: 7.6 10*3/uL (ref 1.7–7.7)
Neutrophils Relative %: 82 %
Platelets: 421 10*3/uL — ABNORMAL HIGH (ref 150–400)
RBC: 3.76 MIL/uL — ABNORMAL LOW (ref 4.22–5.81)
RDW: 18.3 % — ABNORMAL HIGH (ref 11.5–15.5)
WBC: 9.4 10*3/uL (ref 4.0–10.5)
nRBC: 0 % (ref 0.0–0.2)

## 2021-09-26 LAB — ETHANOL: Alcohol, Ethyl (B): 99 mg/dL — ABNORMAL HIGH (ref <10)

## 2021-09-26 LAB — AMMONIA: Ammonia: 28 umol/L (ref 9–35)

## 2021-09-26 MED ORDER — AMOXICILLIN-POT CLAVULANATE 875-125 MG PO TABS
1.0000 | ORAL_TABLET | Freq: Once | ORAL | Status: AC
  Administered 2021-09-26: 1 via ORAL
  Filled 2021-09-26: qty 1

## 2021-09-26 MED ORDER — SODIUM CHLORIDE 0.9 % IV BOLUS
1000.0000 mL | Freq: Once | INTRAVENOUS | Status: AC
  Administered 2021-09-26: 1000 mL via INTRAVENOUS

## 2021-09-26 NOTE — ED Triage Notes (Incomplete)
EMS arrival. Altered mental status. Came from home. States, "Cannot drink milk. Became concerned when I started drooling on myself." Lives with girlfriend. ETOH. Verbalizes excessive alcohol consumption.   ?

## 2021-09-26 NOTE — ED Notes (Signed)
Sister Ms Jimmye Norman [phone] would like an update ?

## 2021-09-26 NOTE — ED Provider Notes (Signed)
?[place] ?Provider Note ? ? ?CSN: [id number] ?Arrival date & time: 09/26/21  2145 ? ?  ? ?History ?No chief complaint on file. ? ? ?Peter [name] is a 71 y.o. male altered mentation and feeling lightheaded earlier.  Patient no longer feels lightheaded.  He reports swelling in his legs with some pain.  He currently has compression socks on.  Patient answering some questions directly and others he does not answer directly.  His speech is slurred.  He endorses drinking a significant amount tonight.  States his roommate made him come. ? ?HPI ? ?  ? ?Home Medications ?Prior to Admission medications   ?Medication Sig Start Date End Date Taking? Authorizing Provider  ?albuterol (VENTOLIN HFA) 108 (90 Base) MCG/ACT inhaler Inhale 2 puffs into the lungs every 6 (six) hours as needed for wheezing or shortness of breath.    [provider]  ?aspirin EC 81 MG tablet Take 81 mg by mouth daily.    [provider]  ?atorvastatin (LIPITOR) 20 MG tablet Take 20 mg by mouth daily.    [provider]  ?azithromycin (ZITHROMAX) 250 MG tablet Take 1 tablet (250 mg total) by mouth daily. Take first 2 tablets together, then 1 every day until finished. 08/17/21   Luna Fuse, MD  ?cephALEXin (KEFLEX) 500 MG capsule Take 1 capsule (500 mg total) by mouth 2 (two) times daily. 09/08/19   Ripley Fraise, MD  ?Multiple Vitamin (MULTIVITAMIN WITH MINERALS) TABS tablet Take 1 tablet by mouth daily.    [provider]  ?   ? ?Allergies    ?Poison ivy extract [poison ivy extract]   ? ?Review of Systems   ?Review of Systems  ?Constitutional:  Negative for fever.  ?Respiratory:  Negative for cough and chest tightness.   ?Cardiovascular:  Negative for chest pain and palpitations.  ?Gastrointestinal:  Negative for abdominal pain.  ?Musculoskeletal:  Positive for myalgias.  ?Neurological:  Positive for light-headedness.  ? ?Physical Exam ?Updated Vital Signs ?BP 105/72    Pulse 94   Temp 98 ?F (36.7 ?C) (Oral) Comment: Simultaneous filing. User may not have seen previous data.  Resp (!) 22   SpO2 99%  ?Physical Exam ?Constitutional:   ?   Comments: Cachectic  ?HENT:  ?   Head: Normocephalic and atraumatic.  ?Eyes:  ?   Extraocular Movements: Extraocular movements intact.  ?Cardiovascular:  ?   Comments: Tachycardic ?Pulmonary:  ?   Breath sounds: Normal breath sounds. No wheezing.  ?   Comments: Tachypnea ?Neurological:  ?   Mental Status: He is alert and oriented to person, place, and time.  ?Psychiatric:  ?   Comments: Speech slurred, sleepy  ? ? ?ED Results / Procedures / Treatments   ?Labs ?(all labs ordered are listed, but only abnormal results are displayed) ?Labs Reviewed  ?CBC WITH DIFFERENTIAL/PLATELET - Abnormal; Notable for the following components:  ?    Result Value  ? RBC 3.76 (*)   ? Hemoglobin 8.9 (*)   ? HCT 29.3 (*)   ? MCV 77.9 (*)   ? MCH 23.7 (*)   ? RDW 18.3 (*)   ? Platelets 421 (*)   ? All other components within normal limits  ?ETHANOL - Abnormal; Notable for the following components:  ? Alcohol, Ethyl (B) 99 (*)   ? All other components within normal limits  ?I-STAT CHEM 8, ED - Abnormal; Notable for the following components:  ? Creatinine, Ser 1.50 (*)   ?  Calcium, Ion 1.11 (*)   ? Hemoglobin 10.5 (*)   ? HCT 31.0 (*)   ? All other components within normal limits  ?AMMONIA  ?COMPREHENSIVE METABOLIC PANEL  ?URINALYSIS, ROUTINE W REFLEX MICROSCOPIC  ? ? ?EKG ?EKG Interpretation ? ?Date/Time:  Thursday September 26 2021 21:57:56 EDT ?Ventricular Rate:  107 ?PR Interval:  154 ?QRS Duration: 89 ?QT Interval:  353 ?QTC Calculation: 471 ?R Axis:   78 ?Text Interpretation: Sinus tachycardia No significant change since last tracing Confirmed by Wandra Arthurs [phone]) on 09/26/2021 10:14:22 PM ? ?Radiology ?CT HEAD WO CONTRAST (5MM) ? ?Result Date: 09/26/2021 ?CLINICAL DATA:  Altered level of consciousness, dizziness EXAM: CT HEAD WITHOUT CONTRAST TECHNIQUE: Contiguous  axial images were obtained from the base of the skull through the vertex without intravenous contrast. RADIATION DOSE REDUCTION: This exam was performed according to the departmental dose-optimization program which includes automated exposure control, adjustment of the mA and/or kV according to patient size and/or use of iterative reconstruction technique. COMPARISON:  01/02/2019 FINDINGS: Brain: No acute infarct or hemorrhage. Lateral ventricles and midline structures are unremarkable. No acute extra-axial fluid collections. No mass effect. Vascular: Stable atherosclerosis.  No hyperdense vessel. Skull: Normal. Negative for fracture or focal lesion. Sinuses/Orbits: Mild mucosal thickening throughout the ethmoid air cells. Remaining paranasal sinuses are clear. Other: None. IMPRESSION: 1. Stable head CT, no acute intracranial process. Electronically Signed   By: Randa Ngo M.D.   On: 09/26/2021 23:57  ? ?DG Chest Portable 1 View ? ?Result Date: 09/26/2021 ?CLINICAL DATA:  Tachypnea EXAM: PORTABLE CHEST 1 VIEW COMPARISON:  08/17/2021 FINDINGS: The lungs are symmetrically hyperinflated in keeping with changes of underlying COPD. Previously noted patchy infiltrate at the right lung base has resolved. Mild left basilar atelectasis or infiltrate has developed. No pneumothorax or pleural effusion. Cardiac size within normal limits. Pulmonary vascularity is normal. IMPRESSION: Resolved right basilar focal pulmonary infiltrate. Developing left basilar atelectasis or infiltrate. COPD Electronically Signed   By: Fidela Salisbury M.D.   On: 09/26/2021 22:51   ? ?Procedures ?Procedures  ? ? ?Medications Ordered in ED ?Medications  ?sodium chloride 0.9 % bolus 1,000 mL (0 mLs Intravenous Stopped 09/26/21 2350)  ?amoxicillin-clavulanate (AUGMENTIN) 875-125 MG per tablet 1 tablet (1 tablet Oral Given 09/26/21 2354)  ? ? ?ED Course/ Medical Decision Making/ A&P ?Clinical Course as of 09/27/21 0028  ?Thu Sep 26, 2021  ?2314 Attempted  to call sister to obtain collateral and update her with plan but she did not answer. [RK]  ?Fri Sep 27, 2021  ?0028 Hg decreased  [RK]  ?  ?Clinical Course User Index ?[RK] Lupita Dawn, MD  ? ?                        ?Medical Decision Making ?Amount and/or Complexity of Data Reviewed ?Labs: ordered. ?Radiology: ordered. ?ECG/medicine tests: ordered. ? ?Risk ?Prescription drug management. ?Decision regarding hospitalization. ? ? ?Hypotension on arrival which is improved with fluids.  No bilateral lower extremity edema.  Apnea noted.  Patient is afebrile.  Chest x-ray concerning for possible infiltrate on my review which is confirmed by radiology.  History of aspiration pneumonia increasing his risk.  Augmentin given.  EtOH level of 99.  Ammonia within normal limits.  Sodium 142, creatinine 1.50, hemoglobin 8.9 with an MCV of 77.9 which is decreased from prior.    Concern for possible underlying malignancy. Blood cultures obtained and started on Unasyn.   Patient has had a  longstanding microcytic anemia however it is worsened substantially over the last month.  His hemoglobin was 11.8 59-monthago now was 8.9.  Patient needing to come in for further evaluation.  Urinalysis needs to be collected and CMP still pending at this time. Seeking admission for AKI, microcytic anemia of unknown origin, and thrombocytosis.  ? ?Patient seen in conjunction my attending Dr. YDarl Householder  ? ?Final Clinical Impression(s) / ED Diagnoses ?Final diagnoses:  ?Altered mental status, unspecified altered mental status type  ?Aspiration pneumonia of left lung, unspecified aspiration pneumonia type, unspecified part of lung ([place]  ?AKI (acute kidney injury) ([place]  ?Iron deficiency anemia, unspecified iron deficiency anemia type  ? ? ?Rx / DC Orders ?ED Discharge Orders   ? ? None  ? ?  ? ? ?  ?KLupita Dawn MD ?09/27/21 0038 ? ?  ?YDrenda Freeze MD ?09/27/21 2350 ? ?

## 2021-09-27 ENCOUNTER — Encounter (HOSPITAL_COMMUNITY): Payer: Self-pay | Admitting: Family Medicine

## 2021-09-27 ENCOUNTER — Other Ambulatory Visit: Payer: Self-pay

## 2021-09-27 DIAGNOSIS — R4182 Altered mental status, unspecified: Secondary | ICD-10-CM | POA: Diagnosis present

## 2021-09-27 DIAGNOSIS — E785 Hyperlipidemia, unspecified: Secondary | ICD-10-CM | POA: Diagnosis present

## 2021-09-27 DIAGNOSIS — Z9841 Cataract extraction status, right eye: Secondary | ICD-10-CM | POA: Diagnosis not present

## 2021-09-27 DIAGNOSIS — G8929 Other chronic pain: Secondary | ICD-10-CM | POA: Diagnosis present

## 2021-09-27 DIAGNOSIS — Z20822 Contact with and (suspected) exposure to covid-19: Secondary | ICD-10-CM | POA: Diagnosis present

## 2021-09-27 DIAGNOSIS — J690 Pneumonitis due to inhalation of food and vomit: Principal | ICD-10-CM

## 2021-09-27 DIAGNOSIS — Z8249 Family history of ischemic heart disease and other diseases of the circulatory system: Secondary | ICD-10-CM | POA: Diagnosis not present

## 2021-09-27 DIAGNOSIS — Z720 Tobacco use: Secondary | ICD-10-CM

## 2021-09-27 DIAGNOSIS — F17210 Nicotine dependence, cigarettes, uncomplicated: Secondary | ICD-10-CM | POA: Diagnosis present

## 2021-09-27 DIAGNOSIS — G9341 Metabolic encephalopathy: Secondary | ICD-10-CM

## 2021-09-27 DIAGNOSIS — H409 Unspecified glaucoma: Secondary | ICD-10-CM | POA: Diagnosis present

## 2021-09-27 DIAGNOSIS — F10920 Alcohol use, unspecified with intoxication, uncomplicated: Secondary | ICD-10-CM | POA: Diagnosis not present

## 2021-09-27 DIAGNOSIS — D509 Iron deficiency anemia, unspecified: Secondary | ICD-10-CM | POA: Diagnosis present

## 2021-09-27 DIAGNOSIS — Z961 Presence of intraocular lens: Secondary | ICD-10-CM | POA: Diagnosis present

## 2021-09-27 DIAGNOSIS — Z9842 Cataract extraction status, left eye: Secondary | ICD-10-CM | POA: Diagnosis not present

## 2021-09-27 DIAGNOSIS — F10129 Alcohol abuse with intoxication, unspecified: Secondary | ICD-10-CM | POA: Diagnosis present

## 2021-09-27 DIAGNOSIS — E119 Type 2 diabetes mellitus without complications: Secondary | ICD-10-CM | POA: Diagnosis present

## 2021-09-27 DIAGNOSIS — D649 Anemia, unspecified: Secondary | ICD-10-CM

## 2021-09-27 DIAGNOSIS — N400 Enlarged prostate without lower urinary tract symptoms: Secondary | ICD-10-CM

## 2021-09-27 DIAGNOSIS — Z8673 Personal history of transient ischemic attack (TIA), and cerebral infarction without residual deficits: Secondary | ICD-10-CM | POA: Diagnosis not present

## 2021-09-27 DIAGNOSIS — F419 Anxiety disorder, unspecified: Secondary | ICD-10-CM | POA: Diagnosis present

## 2021-09-27 DIAGNOSIS — N179 Acute kidney failure, unspecified: Secondary | ICD-10-CM

## 2021-09-27 DIAGNOSIS — K7460 Unspecified cirrhosis of liver: Secondary | ICD-10-CM | POA: Diagnosis present

## 2021-09-27 DIAGNOSIS — F32A Depression, unspecified: Secondary | ICD-10-CM | POA: Diagnosis present

## 2021-09-27 DIAGNOSIS — Z833 Family history of diabetes mellitus: Secondary | ICD-10-CM | POA: Diagnosis not present

## 2021-09-27 DIAGNOSIS — J439 Emphysema, unspecified: Secondary | ICD-10-CM | POA: Diagnosis present

## 2021-09-27 DIAGNOSIS — Y904 Blood alcohol level of 80-99 mg/100 ml: Secondary | ICD-10-CM | POA: Diagnosis present

## 2021-09-27 DIAGNOSIS — I10 Essential (primary) hypertension: Secondary | ICD-10-CM | POA: Diagnosis present

## 2021-09-27 DIAGNOSIS — E860 Dehydration: Secondary | ICD-10-CM | POA: Diagnosis present

## 2021-09-27 LAB — COMPREHENSIVE METABOLIC PANEL
ALT: 11 U/L (ref 0–44)
AST: 13 U/L — ABNORMAL LOW (ref 15–41)
Albumin: 2.6 g/dL — ABNORMAL LOW (ref 3.5–5.0)
Alkaline Phosphatase: 49 U/L (ref 38–126)
Anion gap: 8 (ref 5–15)
BUN: 13 mg/dL (ref 8–23)
CO2: 23 mmol/L (ref 22–32)
Calcium: 8.5 mg/dL — ABNORMAL LOW (ref 8.9–10.3)
Chloride: 109 mmol/L (ref 98–111)
Creatinine, Ser: 1.5 mg/dL — ABNORMAL HIGH (ref 0.61–1.24)
GFR, Estimated: 50 mL/min — ABNORMAL LOW (ref >60)
Glucose, Bld: 93 mg/dL (ref 70–99)
Potassium: 3.6 mmol/L (ref 3.5–5.1)
Sodium: 140 mmol/L (ref 135–145)
Total Bilirubin: 0.2 mg/dL — ABNORMAL LOW (ref 0.3–1.2)
Total Protein: 6.7 g/dL (ref 6.5–8.1)

## 2021-09-27 LAB — CBC
HCT: 29.2 % — ABNORMAL LOW (ref 39.0–52.0)
Hemoglobin: 9.1 g/dL — ABNORMAL LOW (ref 13.0–17.0)
MCH: 23 pg — ABNORMAL LOW (ref 26.0–34.0)
MCHC: 31.2 g/dL (ref 30.0–36.0)
MCV: 73.7 fL — ABNORMAL LOW (ref 80.0–100.0)
Platelets: 487 10*3/uL — ABNORMAL HIGH (ref 150–400)
RBC: 3.96 MIL/uL — ABNORMAL LOW (ref 4.22–5.81)
RDW: 17.2 % — ABNORMAL HIGH (ref 11.5–15.5)
WBC: 9.2 10*3/uL (ref 4.0–10.5)
nRBC: 0 % (ref 0.0–0.2)

## 2021-09-27 LAB — URINALYSIS, ROUTINE W REFLEX MICROSCOPIC
Bilirubin Urine: NEGATIVE
Glucose, UA: NEGATIVE mg/dL
Hgb urine dipstick: NEGATIVE
Ketones, ur: NEGATIVE mg/dL
Nitrite: NEGATIVE
Protein, ur: 30 mg/dL — AB
Specific Gravity, Urine: 1.012 (ref 1.005–1.030)
pH: 6 (ref 5.0–8.0)

## 2021-09-27 LAB — HIV ANTIBODY (ROUTINE TESTING W REFLEX): HIV Screen 4th Generation wRfx: NONREACTIVE

## 2021-09-27 LAB — I-STAT CHEM 8, ED
BUN: 17 mg/dL (ref 8–23)
Calcium, Ion: 1.11 mmol/L — ABNORMAL LOW (ref 1.15–1.40)
Chloride: 107 mmol/L (ref 98–111)
Creatinine, Ser: 1.5 mg/dL — ABNORMAL HIGH (ref 0.61–1.24)
Glucose, Bld: 89 mg/dL (ref 70–99)
HCT: 31 % — ABNORMAL LOW (ref 39.0–52.0)
Hemoglobin: 10.5 g/dL — ABNORMAL LOW (ref 13.0–17.0)
Potassium: 3.8 mmol/L (ref 3.5–5.1)
Sodium: 142 mmol/L (ref 135–145)
TCO2: 25 mmol/L (ref 22–32)

## 2021-09-27 LAB — BASIC METABOLIC PANEL
Anion gap: 6 (ref 5–15)
BUN: 12 mg/dL (ref 8–23)
CO2: 25 mmol/L (ref 22–32)
Calcium: 8.4 mg/dL — ABNORMAL LOW (ref 8.9–10.3)
Chloride: 109 mmol/L (ref 98–111)
Creatinine, Ser: 1.11 mg/dL (ref 0.61–1.24)
GFR, Estimated: >60 mL/min (ref >60)
Glucose, Bld: 90 mg/dL (ref 70–99)
Potassium: 4.1 mmol/L (ref 3.5–5.1)
Sodium: 140 mmol/L (ref 135–145)

## 2021-09-27 LAB — RESP PANEL BY RT-PCR (FLU A&B, COVID) ARPGX2
Influenza A by PCR: NEGATIVE
Influenza B by PCR: NEGATIVE
SARS Coronavirus 2 by RT PCR: NEGATIVE

## 2021-09-27 MED ORDER — ADULT MULTIVITAMIN W/MINERALS CH
1.0000 | ORAL_TABLET | Freq: Every day | ORAL | Status: DC
  Administered 2021-09-28 – 2021-09-29 (×2): 1 via ORAL
  Filled 2021-09-27 (×3): qty 1

## 2021-09-27 MED ORDER — ONDANSETRON HCL 4 MG/2ML IJ SOLN
4.0000 mg | Freq: Four times a day (QID) | INTRAMUSCULAR | Status: DC | PRN
Start: 2021-09-27 — End: 2021-09-29
  Administered 2021-09-28 (×2): 4 mg via INTRAVENOUS
  Filled 2021-09-27 (×2): qty 2

## 2021-09-27 MED ORDER — ACETAMINOPHEN 650 MG RE SUPP
650.0000 mg | Freq: Four times a day (QID) | RECTAL | Status: DC | PRN
Start: 2021-09-27 — End: 2021-09-29

## 2021-09-27 MED ORDER — ONDANSETRON HCL 4 MG PO TABS
4.0000 mg | ORAL_TABLET | Freq: Four times a day (QID) | ORAL | Status: DC | PRN
  Filled 2021-09-27: qty 1

## 2021-09-27 MED ORDER — TRAZODONE HCL 50 MG PO TABS
25.0000 mg | ORAL_TABLET | Freq: Every evening | ORAL | Status: DC | PRN
  Administered 2021-09-28: 25 mg via ORAL
  Filled 2021-09-27: qty 1

## 2021-09-27 MED ORDER — AMPICILLIN-SULBACTAM SODIUM 3 (2-1) G IJ SOLR
3.0000 g | Freq: Three times a day (TID) | INTRAMUSCULAR | Status: DC
  Administered 2021-09-27 – 2021-09-29 (×7): 3 g via INTRAVENOUS
  Filled 2021-09-27 (×7): qty 8

## 2021-09-27 MED ORDER — GUAIFENESIN ER 600 MG PO TB12
600.0000 mg | ORAL_TABLET | Freq: Two times a day (BID) | ORAL | Status: DC
  Administered 2021-09-27 – 2021-09-29 (×5): 600 mg via ORAL
  Filled 2021-09-27 (×7): qty 1

## 2021-09-27 MED ORDER — SODIUM CHLORIDE 0.9 % IV SOLN: INTRAVENOUS | Status: DC

## 2021-09-27 MED ORDER — MAGNESIUM HYDROXIDE 400 MG/5ML PO SUSP
30.0000 mL | Freq: Every day | ORAL | Status: DC | PRN
  Administered 2021-09-29: 30 mL via ORAL
  Filled 2021-09-27: qty 30

## 2021-09-27 MED ORDER — SODIUM CHLORIDE 0.9 % IV SOLN
500.0000 mg | INTRAVENOUS | Status: DC
  Administered 2021-09-27 – 2021-09-28 (×2): 500 mg via INTRAVENOUS
  Filled 2021-09-27 (×2): qty 5

## 2021-09-27 MED ORDER — LORAZEPAM 2 MG/ML IJ SOLN: 1.0000 mg | INTRAMUSCULAR | Status: DC | PRN

## 2021-09-27 MED ORDER — ACETAMINOPHEN 325 MG PO TABS: 650.0000 mg | ORAL_TABLET | Freq: Four times a day (QID) | ORAL | Status: DC | PRN

## 2021-09-27 MED ORDER — THIAMINE HCL 100 MG/ML IJ SOLN
Freq: Once | INTRAVENOUS | Status: AC
  Filled 2021-09-27: qty 1000

## 2021-09-27 MED ORDER — SODIUM CHLORIDE 0.9 % IV SOLN
3.0000 g | INTRAVENOUS | Status: AC
  Administered 2021-09-27: 3 g via INTRAVENOUS
  Filled 2021-09-27: qty 8

## 2021-09-27 MED ORDER — ENOXAPARIN SODIUM 40 MG/0.4ML IJ SOSY
40.0000 mg | PREFILLED_SYRINGE | INTRAMUSCULAR | Status: DC
  Administered 2021-09-27 – 2021-09-29 (×3): 40 mg via SUBCUTANEOUS
  Filled 2021-09-27 (×3): qty 0.4

## 2021-09-27 MED ORDER — SODIUM CHLORIDE 0.9 % IV BOLUS: 500.0000 mL | Freq: Once | INTRAVENOUS | Status: DC

## 2021-09-27 MED ORDER — POTASSIUM CHLORIDE IN NACL 20-0.9 MEQ/L-% IV SOLN
INTRAVENOUS | Status: DC
  Filled 2021-09-27 (×2): qty 1000

## 2021-09-27 MED ORDER — ASPIRIN EC 81 MG PO TBEC
81.0000 mg | DELAYED_RELEASE_TABLET | Freq: Every day | ORAL | Status: DC
  Administered 2021-09-27 – 2021-09-29 (×3): 81 mg via ORAL
  Filled 2021-09-27 (×3): qty 1

## 2021-09-27 MED ORDER — ATORVASTATIN CALCIUM 40 MG PO TABS
40.0000 mg | ORAL_TABLET | Freq: Every day | ORAL | Status: DC
  Administered 2021-09-27 – 2021-09-29 (×3): 40 mg via ORAL
  Filled 2021-09-27 (×3): qty 1

## 2021-09-27 MED ORDER — ALBUTEROL SULFATE (2.5 MG/3ML) 0.083% IN NEBU: 3.0000 mL | INHALATION_SOLUTION | Freq: Four times a day (QID) | RESPIRATORY_TRACT | Status: DC | PRN

## 2021-09-27 NOTE — Assessment & Plan Note (Signed)
-   We will continue statin therapy. 

## 2021-09-27 NOTE — H&P (Addendum)
?  ?  ?Peter [name] ? ? ?PATIENT NAME: Peter [name]   ? ?MR#:  [id number] ? ?DATE OF BIRTH:  1950/07/23 ? ?DATE OF ADMISSION:  09/26/2021 ? ?PRIMARY CARE PHYSICIAN: Sonia Side., FNP  ? ?Patient is coming from: Home ? ?REQUESTING/REFERRING PHYSICIAN: Veatrice Kells, MD ? ?CHIEF COMPLAINT:  ?Altered mental status. ? ?HISTORY OF PRESENT ILLNESS:  ?Peter [name] is a 71 y.o. African-American male with medical history significant for alcohol abuse, hypertension, dyslipidemia, liver cirrhosis, CVA X5, who presented to the ER with acute onset of altered mental status as well as dizziness.  He was starting to drool on himself, per his report.  He has been drinking alcohol heavily.  His speech was slightly slurred.  He admits to coughing and mild dyspnea without wheezing.  Apparently his roommate made him come.  He continues to smoke half pack of cigarettes per day and drinks about a bottle of beer daily.  He denies any chest pain or palpitations.  He denies any nausea or vomiting or abdominal pain and diarrhea bleeding diathesis.  No melena or bright red bleeding per rectum.  No dysuria, oliguria, urinary frequency or urgency or hematuria or flank pain.   No other bleeding diathesis. ? ?ED Course: When he came to the ER BP was 91/59 with heart rate creatinine of 1.5 and potassium of 3.6 with a calcium of 8.5 and albumin of 2.6 with total protein of 6.7.  Total bili was 0.2.  LFTs were otherwise normal.  CBC showed anemia with hemoglobin of 8.9 hematocrit 29.3 compared to 11.8 and 37.5.  Repeat H&H were 10.5 and 31.  Labs revealed 113 respiratory to 28.  Later on BP was up to 122/80 with respiratory rate of 16.   ?EKG as reviewed by me : EKG showed sinus tachycardia with rate 107 ?Imaging: Chest x-ray showed developing left basal atelectasis or infiltrate, COPD and resolved right basal focal pulmonary infiltrates. ? ?The patient was given IV Unasyn, 1 L bolus of IV normal saline for 125 mL/h.  He will be admitted to a  medical telemetry bed for further evaluation and management. ?PAST MEDICAL HISTORY:  ? ?Past Medical History:  ?Diagnosis Date  ? Ambulates with cane   ? straight cane  ? Anemia   ? iron def.  ? Anxiety   ? no meds  ? Bone spur of other site   ? Left Foot  ? Cataract   ? surgery to remove  ? Chronic pain   ? Cirrhosis of liver ([place])   ? Depression   ? no meds  ? Diabetes mellitus without complication ([place])   ? Patient Denies - No meds  ? Emphysema of lung ([place])   ? ETOH abuse   ? Glaucoma   ? Hard of hearing   ? Hyperlipemia   ? Hypertension   ? not on medication  ? Impaired mobility   ? uses cane  ? Pneumonia   ? x 1  ? Sinusitis   ? Stroke [facility])   ? weakness left side  ? Total self care deficit   ? Denies - Performs ADLs without assistance per patient 03/30/19  ? ? ?PAST SURGICAL HISTORY:  ? ?Past Surgical History:  ?Procedure Laterality Date  ? bone spur  2013  ? left spur  ? CATARACT EXTRACTION W/ INTRAOCULAR LENS IMPLANT Bilateral   ? COLONOSCOPY    ? ESOPHAGOGASTRODUODENOSCOPY  01/09/2012  ? Procedure: ESOPHAGOGASTRODUODENOSCOPY (EGD);  Surgeon: Beryle Beams, MD;  Location: [facility];  Service: Endoscopy;  Laterality: N/A;  ? IM NAILING TIBIA Right 04/16/2017  ? MOUTH SURGERY    ? MULTIPLE EXTRACTIONS WITH ALVEOLOPLASTY  03/29/2012  ? Procedure: MULTIPLE EXTRACION WITH ALVEOLOPLASTY;  Surgeon: Gae Bon, DDS;  Location: [place];  Service: Oral Surgery;  Laterality: Bilateral;  ? ORIF ELBOW FRACTURE Right 01/03/2019  ? Procedure: OPEN REDUCTION INTERNAL FIXATION (ORIF) ELBOW/OLECRANON FRACTURE;  Surgeon: [name], MD;  Location: [place];  Service: Orthopedics;  Laterality: Right;  ? ORIF ELBOW FRACTURE Right 03/31/2019  ? Procedure: right olecranon nonunion revision open reduction, internal fixation and surgery as indicated;  Surgeon: [name], MD;  Location: [place];  Service: Orthopedics;  Laterality: Right;  3mn  ? TIBIA IM NAIL INSERTION Right 04/16/2017  ? Procedure:  INTRAMEDULLARY (IM) NAIL TIBIAL;  Surgeon: XLeandrew Koyanagi MD;  [place]  Service: Orthopedics;  Laterality: Right;  ? ? ?SOCIAL HISTORY:  ? ?Social History  ? ?Tobacco Use  ? Smoking status: Every Day  ?  Packs/day: 1.00  ?  Years: 30.00  ?  Pack years: 30.00  ?  Types: Cigarettes  ? Smokeless tobacco: Never  ?Substance Use Topics  ? Alcohol use: Yes  ?  Alcohol/week: 7.0 standard drinks  ?  Types: 7 Cans of beer per week  ? ? ?FAMILY HISTORY:  ? ?Family History  ?Problem Relation Age of Onset  ? Breast cancer Mother   ? Diabetes Maternal Aunt   ? Heart disease Sister   ? Heart disease Maternal Aunt   ? ? ?DRUG ALLERGIES:  ? ?Allergies  ?Allergen Reactions  ? Poison Ivy Extract [Poison Ivy Extract] Rash  ? ? ?REVIEW OF SYSTEMS:  ? ?ROS ?As per history of present illness. All pertinent systems were reviewed above. Constitutional, HEENT, cardiovascular, respiratory, GI, GU, musculoskeletal, neuro, psychiatric, endocrine, integumentary and hematologic systems were reviewed and are otherwise negative/unremarkable except for positive findings mentioned above in the HPI. ? ? ?MEDICATIONS AT HOME:  ? ?Prior to Admission medications   ?Medication Sig Start Date End Date Taking? Authorizing Provider  ?albuterol (VENTOLIN HFA) 108 (90 Base) MCG/ACT inhaler Inhale 2 puffs into the lungs every 6 (six) hours as needed for wheezing or shortness of breath.    [provider]  ?aspirin EC 81 MG tablet Take 81 mg by mouth daily.    [provider]  ?atorvastatin (LIPITOR) 20 MG tablet Take 20 mg by mouth daily.    [provider]  ?azithromycin (ZITHROMAX) 250 MG tablet Take 1 tablet (250 mg total) by mouth daily. Take first 2 tablets together, then 1 every day until finished. 08/17/21   HLuna Fuse MD  ?cephALEXin (KEFLEX) 500 MG capsule Take 1 capsule (500 mg total) by mouth 2 (two) times daily. 09/08/19   WRipley Fraise MD  ?Multiple Vitamin (MULTIVITAMIN WITH MINERALS) TABS tablet Take 1  tablet by mouth daily.    [provider]  ? ?  ? ?VITAL SIGNS:  ?Blood pressure 122/80, pulse 88, temperature 98.6 ?F (37 ?C), temperature source Oral, resp. rate 16, SpO2 100 %. ? ?PHYSICAL EXAMINATION:  ?Physical Exam ? ?GENERAL:  71y.o.-year-old African-American male patient lying in the bed with no acute distress.  ?EYES: Pupils equal, round, reactive to light and accommodation. No scleral icterus. Extraocular muscles intact.  ?HEENT: Head atraumatic, normocephalic. Oropharynx and nasopharynx clear.  ?NECK:  Supple, no jugular venous distention. No thyroid enlargement, no tenderness.  ?LUNGS:  Slightly diminished left basal breath sounds with left basal crackles.  No use of accessory muscles of respiration.  ?CARDIOVASCULAR: Regular rate and rhythm, S1, S2 normal. No murmurs, rubs, or gallops.  ?ABDOMEN: Soft, nondistended, nontender. Bowel sounds present. No organomegaly or mass.  ?EXTREMITIES: No pedal edema, cyanosis, or clubbing.  ?NEUROLOGIC: Cranial nerves II through XII are intact. Muscle strength 5/5 in all extremities. Sensation intact. Gait not checked.  ?PSYCHIATRIC: The patient is alert and oriented x 3.  Normal affect and good eye contact. ?SKIN: No obvious rash, lesion, or ulcer.  ? ?LABORATORY PANEL:  ? ?CBC ?Recent Labs  ?Lab 09/26/21 ?2150 09/27/21 ?0009  ?WBC 9.4  --   ?HGB 8.9* 10.5*  ?HCT 29.3* 31.0*  ?PLT 421*  --   ? ?------------------------------------------------------------------------------------------------------------------ ? ?Chemistries  ?Recent Labs  ?Lab 09/26/21 ?2356 09/27/21 ?0009  ?NA 140 142  ?K 3.6 3.8  ?CL 109 107  ?CO2 23  --   ?GLUCOSE 93 89  ?BUN 13 17  ?CREATININE 1.50* 1.50*  ?CALCIUM 8.5*  --   ?AST 13*  --   ?ALT 11  --   ?ALKPHOS 49  --   ?BILITOT 0.2*  --   ? ?------------------------------------------------------------------------------------------------------------------ ? ?Cardiac Enzymes ?No results for input(s): TROPONINI in the last 168  hours. ?------------------------------------------------------------------------------------------------------------------ ? ?RADIOLOGY:  ?CT HEAD WO CONTRAST (5MM) ? ?Result Date: 09/26/2021 ?CLINICAL DATA:  Altered level of c

## 2021-09-27 NOTE — Assessment & Plan Note (Signed)
-   We will continue Flomax 

## 2021-09-27 NOTE — Progress Notes (Signed)
?  Transition of Care (TOC) Screening Note ? ? ?Patient Details  ?Name: Peter [name] ?Date of Birth: [birth year] ? ? ?Transition of Care (TOC) CM/SW Contact:    ?Benard Halsted, LCSW ?Phone Number: ?09/27/2021, 1:50 PM ? ? ? ?Transition of Care Department [facility]) has reviewed patient. We will continue to monitor patient advancement through interdisciplinary progression rounds. If new patient transition needs arise, please place a TOC consult. ? ? ?

## 2021-09-27 NOTE — Assessment & Plan Note (Signed)
-   It is currently better but still lower than previous values last month. ?- Continue monitoring H&H. ?

## 2021-09-27 NOTE — ED Notes (Signed)
Pt's sister wants an update regarding pt's status; Tresa Garter [phone] ?

## 2021-09-27 NOTE — Assessment & Plan Note (Signed)
-   This is likely prerenal due to volume depletion and dehydration. ?- He will be hydrated with IV normal saline and will follow his BMP. ?

## 2021-09-27 NOTE — Evaluation (Signed)
Clinical/Bedside Swallow Evaluation ?Patient Details  ?Name: Peter [name] ?MRN: [id number] ?Date of Birth: July 31, 1950 ? ?Today's Date: 09/27/2021 ?Time: SLP Start Time (ACUTE ONLY): [id number] SLP Stop Time (ACUTE ONLY): 1000 ?SLP Time Calculation (min) (ACUTE ONLY): 17 min ? ?Past Medical History:  ?Past Medical History:  ?Diagnosis Date  ? Ambulates with cane   ? straight cane  ? Anemia   ? iron def.  ? Anxiety   ? no meds  ? Bone spur of other site   ? Left Foot  ? Cataract   ? surgery to remove  ? Chronic pain   ? Cirrhosis of liver ([place])   ? Depression   ? no meds  ? Diabetes mellitus without complication ([place])   ? Patient Denies - No meds  ? Emphysema of lung ([place])   ? ETOH abuse   ? Glaucoma   ? Hard of hearing   ? Hyperlipemia   ? Hypertension   ? not on medication  ? Impaired mobility   ? uses cane  ? Pneumonia   ? x 1  ? Sinusitis   ? Stroke [facility])   ? weakness left side  ? Total self care deficit   ? Denies - Performs ADLs without assistance per patient 03/30/19  ? ?Past Surgical History:  ?Past Surgical History:  ?Procedure Laterality Date  ? bone spur  2013  ? left spur  ? CATARACT EXTRACTION W/ INTRAOCULAR LENS IMPLANT Bilateral   ? COLONOSCOPY    ? ESOPHAGOGASTRODUODENOSCOPY  01/09/2012  ? Procedure: ESOPHAGOGASTRODUODENOSCOPY (EGD);  Surgeon: Beryle Beams, MD;  Location: [facility] ENDOSCOPY;  Service: Endoscopy;  Laterality: N/A;  ? IM NAILING TIBIA Right 04/16/2017  ? MOUTH SURGERY    ? MULTIPLE EXTRACTIONS WITH ALVEOLOPLASTY  03/29/2012  ? Procedure: MULTIPLE EXTRACION WITH ALVEOLOPLASTY;  Surgeon: Gae Bon, DDS;  Location: [place];  Service: Oral Surgery;  Laterality: Bilateral;  ? ORIF ELBOW FRACTURE Right 01/03/2019  ? Procedure: OPEN REDUCTION INTERNAL FIXATION (ORIF) ELBOW/OLECRANON FRACTURE;  Surgeon: [name], MD;  Location: [place];  Service: Orthopedics;  Laterality: Right;  ? ORIF ELBOW FRACTURE Right 03/31/2019  ? Procedure: right olecranon nonunion revision open reduction, internal  fixation and surgery as indicated;  Surgeon: [name], MD;  Location: [place];  Service: Orthopedics;  Laterality: Right;  80mn  ? TIBIA IM NAIL INSERTION Right 04/16/2017  ? Procedure: INTRAMEDULLARY (IM) NAIL TIBIAL;  Surgeon: XLeandrew Koyanagi MD;  [place]  Service: Orthopedics;  Laterality: Right;  ? ?HPI:  ?Pt is a 70y.o. male  who presented to the ER with acute onset of altered mental status as well as dizziness. He was starting to drool on himself, per his report.  He has been drinking alcohol heavily. CXR 09/26/21 revealed "Resolved right basilar focal pulmonary infiltrate. Developing left basilar atelectasis or infiltrate. COPD". Head CT= negative. MBS (01/05/2012) revealed mild oropharyngeal dysphagia without penetration/aspiration. Dys 2/thin liquid diet recommended at that time. BSE (05/29/2014) revealed no indication of pharyngeal dysphagia. Esophageal component suspected. Soft diet/ thin liquids recommended at that time. PMH: alcohol abuse, hypertension, dyslipidemia, liver cirrhosis, PNA, CVA X5.  ?  ?Assessment / Plan / Recommendation  ?Clinical Impression ? Pt presents with no clinical s/sx of pharyngeal dysphagia at bedside. Oral mechanism examination unremarkable and pt is edentulous. He reports that his dentures are uncomfortable to wear and that he doesn't wear them on a regular basis. He states he mashes food with tongue at  roof of mouth to orally prep. At [facility] he consumes soft solids. Thin liquids were without signs of aspiration across x2 trials of consecutive sipping. Prolonged mastication/oral manipulation noted with ice chips and regular textured solids, but suspect this is primarily due to edentulous status. Intermittent belching exhibited throughout trials. Provided option of dys 2, dys 3 or choosing of soft meal items on regular diet. Pt prefers dys 2 diet at this time for ease of mastication. Educated in regards to universal swallow/reflux precautions to increase  safety with PO intake. SLP to f/u briefly for tolerance given hx of recurrent PNA. ? ?SLP Visit Diagnosis: Dysphagia, unspecified (R13.10) ?   ?Aspiration Risk ? Mild aspiration risk  ?  ?Diet Recommendation Dysphagia 2 (Fine chop);Thin liquid  ? ?Liquid Administration via: Straw;Cup ?Medication Administration: Whole meds with liquid ?Supervision: Patient able to self feed ?Compensations: Slow rate;Small sips/bites ?Postural Changes: Seated upright at 90 degrees;Remain upright for at least 30 minutes after po intake  ?  ?Other  Recommendations Oral Care Recommendations: Oral care BID   ? ?Recommendations for follow up therapy are one component of a multi-disciplinary discharge planning process, led by the attending physician.  Recommendations may be updated based on patient status, additional functional criteria and insurance authorization. ? ?Follow up Recommendations No SLP follow up  ? ? ?  ?Assistance Recommended at Discharge PRN  ?Functional Status Assessment Patient has had a recent decline in their functional status and demonstrates the ability to make significant improvements in function in a reasonable and predictable amount of time.  ?Frequency and Duration min 2x/week  ?2 weeks ?  ?   ? ?Prognosis Prognosis for Safe Diet Advancement: Good ?Barriers to Reach Goals: Time post onset  ? ?  ? ?Swallow Study   ?General Date of Onset: 09/26/21 ?HPI: Pt is a 71 y.o. male  who presented to the ER with acute onset of altered mental status as well as dizziness. He was starting to drool on himself, per his report.  He has been drinking alcohol heavily. CXR 09/26/21 revealed "Resolved right basilar focal pulmonary infiltrate. Developing left basilar atelectasis or infiltrate. COPD". Head CT= negative. MBS (01/05/2012) revealed mild oropharyngeal dysphagia without penetration/aspiration. Dys 2/thin liquid diet recommended at that time. BSE (05/29/2014) revealed no indication of pharyngeal dysphagia. Esophageal component  suspected. Soft diet/ thin liquids recommended at that time. PMH: alcohol abuse, hypertension, dyslipidemia, liver cirrhosis, CVA X5. ?Type of Study: Bedside Swallow Evaluation ?Previous Swallow Assessment: see HPI ?Diet Prior to this Study: Regular;Thin liquids ?Temperature Spikes Noted: No ?Respiratory Status: Room air ?History of Recent Intubation: No ?Behavior/Cognition: Alert;Cooperative;Pleasant mood ?Oral Cavity Assessment: Within Functional Limits ?Oral Care Completed by SLP: No ?Oral Cavity - Dentition: Edentulous ?Vision: Functional for self-feeding ?Self-Feeding Abilities: Able to feed self ?Patient Positioning: Upright in bed;Postural control adequate for testing ?Baseline Vocal Quality: Hoarse ?Volitional Cough: Strong ?Volitional Swallow: Able to elicit  ?  ?Oral/Motor/Sensory Function Overall Oral Motor/Sensory Function: Within functional limits   ?Ice Chips Ice chips: Within functional limits ?Presentation: Spoon   ?Thin Liquid Thin Liquid: Within functional limits ?Presentation: Straw;Cup;Self Fed  ?  ?Nectar Thick Nectar Thick Liquid: Not tested   ?Honey Thick Honey Thick Liquid: Not tested   ?Puree Puree: Within functional limits ?Presentation: Spoon;Self Fed   ?Solid ? ? ?  Solid: Impaired ?Presentation: Self Fed ?Oral Phase Impairments: Impaired mastication ?Oral Phase Functional Implications: Impaired mastication  ? ?  ? ? ?Ellwood Dense, MA, CCC-SLP ?Acute Rehabilitation Services ?Office Number: [phone] ? ?  Acie Fredrickson ?09/27/2021,10:16 AM ? ? ? ? ?

## 2021-09-27 NOTE — Progress Notes (Signed)
?PROGRESS NOTE ? ?Peter [name]  ?DOB: 1951-01-01  ?PCP: Sonia Side., FNP ?JHE:[id number]  ?DOA: 09/26/2021 ? LOS: 0 days  ?Hospital Day: 2 ? ?Brief narrative: ?Peter [name] is a 71 y.o. male with PMH significant for chronic daily alcohol use, chronic smoking, COPD, DM 2, HTN, HLD, stroke with left-sided weakness chronic anemia, anxiety/depression, chronic pain, impaired mobility ?Patient presented to the ED on 3/23 with complaint of altered mental status, lightheadedness, bilateral lower extremity edema, slurred speech, coughing, mild dyspnea. ?Continues to smoke half pack of cigarettes a day, drinks about a bottle of beer daily.  ? ?In the ED, patient was afebrile, heart rate 113, blood pressure 91/59, breathing on room air ?Labs with creatinine elevated to 1.5, hemoglobin 10.5, WBC normal, LFTs and ammonia levels normal ?EKG showed sinus tachycardia ?CT scan of head was unremarkable ?Chest x-ray suspicious for right lower lobe pneumonia ?Blood culture was sent.  Patient was started on IV antibiotics, IV fluid ?Admitted to hospitalist service ? ?Subjective: ?Patient was seen and examined this morning.  Pleasant elderly African-American male.  Not in distress.  No new symptoms.  Looks more awake.  Not in supplemental oxygen. ?Chart reviewed ?No fever, hemodynamically stable. ?Creatinine better this morning ? ?Active Problems: ?  Aspiration pneumonia ([place]) ?  AKI (acute kidney injury) ([place]) ?  Acute alcohol intoxication ([place]) ?  Acute metabolic encephalopathy ?  COPD (chronic obstructive pulmonary disease) ([place]) ?  Acute on chronic anemia ?  Dyslipidemia ?  BPH (benign prostatic hyperplasia) ?  Tobacco abuse ?  History of CVA (cerebrovascular accident) ?  ? ?Assessment and Plan: ?Aspiration pneumonia   ?-Presented with altered mental status, cough, shortness of breath, tachycardia in the setting of chronic alcohol abuse ?-Chest x-ray with right lower lobe infiltrates suspicious for aspiration  pneumonia ?-Currently on IV Unasyn and Zithromax ?-Breathing on room air at rest.  Check ambulatory oxygen requirement ?-Continue incentive spirometry, breathing exercises, Mucinex ?-Speech therapy eval ?Recent Labs  ?Lab 09/26/21 ?2150 09/27/21 ?0353  ?WBC 9.4 9.2  ? ?AKI (acute kidney injury)   ?-Likely prerenal due to volume depletion ?-Improving with IV fluid ?Recent Labs  ?  08/17/21 ?1452 09/26/21 ?2356 09/27/21 ?0009 09/27/21 ?0353  ?BUN '15 13 17 12  '$ ?CREATININE 0.94 1.50* 1.50* 1.11  ? ?Acute metabolic encephalopathy ?-likely secondary to pneumonia, AKI, alcohol use ?-Ammonia level normal ?-Mental status seems to have improved ? ?Chronic alcohol use ?-Continues to drink daily.   ?-Watch out for alcohol withdrawal symptoms.  Currently on CIWA protocol with as needed IV Ativan ? ?Acute on chronic anemia ?-Baseline hemoglobin more than 10, presented with hemoglobin of 8.9, no active bleeding.  Continue to monitor ?-Obtain anemia panel. ?Recent Labs  ?  08/17/21 ?1452 09/26/21 ?2150 09/27/21 ?0009 09/27/21 ?0353  ?HGB 11.8* 8.9* 10.5* 9.1*  ?MCV 76.7* 77.9*  --  73.7*  ? ?History of CVA with left-sided weakness ?Dyslipidemia ?-continue aspirin and statin  ? ?COPD (chronic obstructive pulmonary disease)   ?Current daily smoker ?-Counseled to quit alcohol.   ?-Continue bronchodilators ? ?BPH (benign prostatic hyperplasia) ?-continue Flomax. ? ?Goals of care ?  Code Status: Full Code  ? ? ?Mobility: Encourage ambulation.  Uses a cane at home. ? ?Nutritional status:  ?There is no height or weight on file to calculate BMI.  ?  ?  ? ? ? ? ?Diet:  ?Diet Order   ? ?       ?  DIET DYS 2 Room service appropriate?  Yes with Assist; Fluid consistency: Thin  Diet effective now       ?  ? ?  ?  ? ?  ? ? ?DVT prophylaxis:  ?enoxaparin (LOVENOX) injection 40 mg Start: 09/27/21 1000 ?  ?Antimicrobials: IV Unasyn, azithromycin IV ?Fluid: NS at 125 mm/h ?Consultants: None ?Family Communication: None at bedside ? ?Status is:  Inpatient ? ?Continue in-hospital care because: Ongoing care ?Level of care: Telemetry Medical  ? ?Dispo: The patient is from: Home ?             Anticipated d/c is to: Likely home, pending PT eval ?             Patient currently is not medically stable to d/c. ?  Difficult to place patient No ? ? ? ? ?Infusions:  ? sodium chloride 125 mL/hr at 09/27/21 0209  ? ampicillin-sulbactam (UNASYN) IV 3 g (09/27/21 1009)  ? azithromycin Stopped (09/27/21 2440)  ? sodium chloride    ? ? ?Scheduled Meds: ? aspirin EC  81 mg Oral Daily  ? atorvastatin  40 mg Oral Daily  ? enoxaparin (LOVENOX) injection  40 mg Subcutaneous Q24H  ? guaiFENesin  600 mg Oral BID  ? multivitamin with minerals  1 tablet Oral Daily  ? ? ?PRN meds: ?acetaminophen **OR** acetaminophen, albuterol, LORazepam, magnesium hydroxide, ondansetron **OR** ondansetron (ZOFRAN) IV, traZODone  ? ?Antimicrobials: ?Anti-infectives (From admission, onward)  ? ? Start     Dose/Rate Route Frequency Ordered Stop  ? 09/27/21 1000  Ampicillin-Sulbactam (UNASYN) 3 g in sodium chloride 0.9 % 100 mL IVPB       ? 3 g ?200 mL/hr over 30 Minutes Intravenous Every 8 hours 09/27/21 0222    ? 09/27/21 0230  azithromycin (ZITHROMAX) 500 mg in sodium chloride 0.9 % 250 mL IVPB       ? 500 mg ?250 mL/hr over 60 Minutes Intravenous Every 24 hours 09/27/21 0222 10/02/21 0229  ? 09/27/21 0045  Ampicillin-Sulbactam (UNASYN) 3 g in sodium chloride 0.9 % 100 mL IVPB       ? 3 g ?200 mL/hr over 30 Minutes Intravenous STAT 09/27/21 0030 09/27/21 0256  ? 09/26/21 2315  amoxicillin-clavulanate (AUGMENTIN) 875-125 MG per tablet 1 tablet       ? 1 tablet Oral  Once 09/26/21 2314 09/26/21 2354  ? ?  ? ? ?Objective: ?Vitals:  ? 09/27/21 1119 09/27/21 1159  ?BP:  136/86  ?Pulse:  97  ?Resp:  (!) 23  ?Temp: 98.4 ?F (36.9 ?C) 98.9 ?F (37.2 ?C)  ?SpO2:  99%  ? ? ?Intake/Output Summary (Last 24 hours) at 09/27/2021 1521 ?Last data filed at 09/27/2021 1027 ?Gross per 24 hour  ?Intake 1348.48 ml  ?Output  --  ?Net 1348.48 ml  ? ?There were no vitals filed for this visit. ?Weight change:  ?There is no height or weight on file to calculate BMI.  ? ?Physical Exam: ?General exam: Pleasant elderly African-American male.  Not in physical distress ?Skin: No rashes, lesions or ulcers. ?HEENT: Atraumatic, normocephalic, no obvious bleeding ?Lungs: Clear to auscultation bilaterally ?CVS: Regular rate and rhythm, no murmur ?GI/Abd soft, nontender, nondistended, bowel sound present ?CNS: Alert, awake, oriented to place and person.  But slow to respond ?Psychiatry: Sad affect ?Extremities: No pedal edema, no calf tenderness ? ?Data Review: I have personally reviewed the laboratory data and studies available. ? ?F/u labs ordered ?Unresulted Labs (From admission, onward)  ? ?  Start     Ordered  ?  09/28/21 0500  CBC with Differential/Platelet  Tomorrow morning,   R       ? 09/27/21 1521  ? 09/28/21 6948  Basic metabolic panel  Tomorrow morning,   R       ? 09/27/21 1521  ? 09/28/21 0500  Magnesium  Tomorrow morning,   STAT       ? 09/27/21 1521  ? 09/28/21 0500  Phosphorus  Tomorrow morning,   R       ? 09/27/21 1521  ? 09/27/21 0031  Blood culture (routine x 2)  BLOOD CULTURE X 2,   STAT     ?Question Answer Comment  ?Patient immune status Normal   ?Release to patient Immediate   ?  ? 09/27/21 0030  ? ?  ?  ? ?  ? ? ?Signed, ?Terrilee Croak, MD ?[facility] ?09/27/2021 ? ? ? ? ? ? ? ? ? ? ? ? ?

## 2021-09-27 NOTE — Assessment & Plan Note (Signed)
-   He has no current exacerbation. ?- As needed albuterol be utilized. ?

## 2021-09-27 NOTE — ED Notes (Signed)
Sister Kimberlee Nearing [phone] would like an update asap ?

## 2021-09-27 NOTE — Progress Notes (Signed)
Mr. Eckert is alert and oriented x4. No complaints of pain. Transferred from stretcher to bed with no issues. Skin assessed with Junie Panning RN, no issues noted. When assessing skin asked pt to remove jeans and observed 2 lighters, a wallet in jean pocket and personal glucometer test strip bottle was found in bed. This nurse placed bottle into jeans and asked if patient had any other valuable items with him, he stated no. NT then cleansed skin and changed gown, and when patient rolled away from NT contraband was found in bed. This nurse called charge RN, and security was notified.  ? ?When security came to room, explained to Mr. Surgeon that lighters were not allowed in the hospital. This nurse asked again if he had any valuable items with him, he then stated he had money in his jeans. Assessed jeans at bedside with patient and found 2 lighters, keys, a wallet, loose money, a Research scientist (physical sciences), personal test strip bottle along with the contraband found in bed. Patient decided to keep wallet, although the lighters, keys, loose money (1 $100, 10 $20's, 3 $10's, 3 5's, 19 $1's) and swiss army knife were securely locked away in patient valuables envelope with security.  ?

## 2021-09-27 NOTE — Assessment & Plan Note (Signed)
-   The patient will be monitored for alcohol withdrawal. ?- We will place on as needed IV Ativan. ?- A banana bag daily will be given. ?

## 2021-09-27 NOTE — ED Notes (Signed)
Pt's sister Kimberlee Nearing [phone] wants an update on her brother ?

## 2021-09-27 NOTE — Assessment & Plan Note (Signed)
-   The patient will be admitted to a medical telemetry bed. ?- We will continue her on IV Unasyn and Zithromax. ?- Mucolytic therapy will be provided as well as as needed albuterol. ?- We will follow blood cultures. ?- We will obtain a speech therapy consult to assess his swallowing. ?

## 2021-09-27 NOTE — Assessment & Plan Note (Addendum)
-   We will continue on aspirin. ?

## 2021-09-27 NOTE — Assessment & Plan Note (Signed)
-   He was counseled for smoking cessation less than 3 minutes and will receive further counseling here. ?

## 2021-09-27 NOTE — Assessment & Plan Note (Signed)
-   This is likely secondary to pneumonia and AKI. ?- Management as above. ?

## 2021-09-28 ENCOUNTER — Inpatient Hospital Stay (HOSPITAL_COMMUNITY): Payer: Medicare Other

## 2021-09-28 LAB — CBC WITH DIFFERENTIAL/PLATELET
Abs Immature Granulocytes: 0.01 10*3/uL (ref 0.00–0.07)
Basophils Absolute: 0 10*3/uL (ref 0.0–0.1)
Basophils Relative: 1 %
Eosinophils Absolute: 0.1 10*3/uL (ref 0.0–0.5)
Eosinophils Relative: 2 %
HCT: 31.4 % — ABNORMAL LOW (ref 39.0–52.0)
Hemoglobin: 9.6 g/dL — ABNORMAL LOW (ref 13.0–17.0)
Immature Granulocytes: 0 %
Lymphocytes Relative: 39 %
Lymphs Abs: 2.3 10*3/uL (ref 0.7–4.0)
MCH: 22.7 pg — ABNORMAL LOW (ref 26.0–34.0)
MCHC: 30.6 g/dL (ref 30.0–36.0)
MCV: 74.2 fL — ABNORMAL LOW (ref 80.0–100.0)
Monocytes Absolute: 0.6 10*3/uL (ref 0.1–1.0)
Monocytes Relative: 10 %
Neutro Abs: 2.9 10*3/uL (ref 1.7–7.7)
Neutrophils Relative %: 48 %
Platelets: 540 10*3/uL — ABNORMAL HIGH (ref 150–400)
RBC: 4.23 MIL/uL (ref 4.22–5.81)
RDW: 17.2 % — ABNORMAL HIGH (ref 11.5–15.5)
WBC: 5.9 10*3/uL (ref 4.0–10.5)
nRBC: 0 % (ref 0.0–0.2)

## 2021-09-28 LAB — BASIC METABOLIC PANEL
Anion gap: 4 — ABNORMAL LOW (ref 5–15)
BUN: 9 mg/dL (ref 8–23)
CO2: 26 mmol/L (ref 22–32)
Calcium: 8.7 mg/dL — ABNORMAL LOW (ref 8.9–10.3)
Chloride: 112 mmol/L — ABNORMAL HIGH (ref 98–111)
Creatinine, Ser: 1 mg/dL (ref 0.61–1.24)
GFR, Estimated: >60 mL/min (ref >60)
Glucose, Bld: 92 mg/dL (ref 70–99)
Potassium: 4.6 mmol/L (ref 3.5–5.1)
Sodium: 142 mmol/L (ref 135–145)

## 2021-09-28 LAB — MAGNESIUM: Magnesium: 1.6 mg/dL — ABNORMAL LOW (ref 1.7–2.4)

## 2021-09-28 LAB — PHOSPHORUS: Phosphorus: 2.9 mg/dL (ref 2.5–4.6)

## 2021-09-28 MED ORDER — AZITHROMYCIN 500 MG PO TABS
500.0000 mg | ORAL_TABLET | Freq: Every day | ORAL | Status: DC
  Administered 2021-09-28: 500 mg via ORAL
  Filled 2021-09-28: qty 1

## 2021-09-28 NOTE — Progress Notes (Signed)
?PROGRESS NOTE ? ?Peter [name]  ?DOB: 07-31-1950  ?PCP: Sonia Side., FNP ?WNU:[id number]  ?DOA: 09/26/2021 ? LOS: 1 day  ?Hospital Day: 3 ? ?Brief narrative: ?Peter [name] is a 71 y.o. male with PMH significant for chronic daily alcohol use, chronic smoking, COPD, DM 2, HTN, HLD, stroke with left-sided weakness chronic anemia, anxiety/depression, chronic pain, impaired mobility ?Patient presented to the ED on 3/23 with complaint of altered mental status, lightheadedness, bilateral lower extremity edema, slurred speech, coughing, mild dyspnea. ?Continues to smoke half pack of cigarettes a day, drinks about a bottle of beer daily.  ? ?In the ED, patient was afebrile, heart rate 113, blood pressure 91/59, breathing on room air ?Labs with creatinine elevated to 1.5, hemoglobin 10.5, WBC normal, LFTs and ammonia levels normal ?EKG showed sinus tachycardia ?CT scan of head was unremarkable ?Chest x-ray suspicious for right lower lobe pneumonia ?Blood culture was sent.  Patient was started on IV antibiotics, IV fluid ?Admitted to hospitalist service ? ?Subjective: ?Patient was seen and examined this morning.   ?Sitting up in chair.  Not in distress.  No new symptoms.  No family at bedside.   ?Patient was helped out from bed to chair by nurse this morning.  Patient states he felt too weak and unsteady.  Pending PT eval  ? ?Active Problems: ?  Aspiration pneumonia ([place]) ?  AKI (acute kidney injury) ([place]) ?  Acute alcohol intoxication ([place]) ?  Acute metabolic encephalopathy ?  COPD (chronic obstructive pulmonary disease) ([place]) ?  Acute on chronic anemia ?  Dyslipidemia ?  BPH (benign prostatic hyperplasia) ?  Tobacco abuse ?  History of CVA (cerebrovascular accident) ?  ? ?Assessment and Plan: ?Aspiration pneumonia   ?-Presented with altered mental status, cough, shortness of breath, tachycardia in the setting of chronic alcohol abuse ?-Chest x-ray with right lower lobe infiltrates suspicious for aspiration  pneumonia ?-Currently on IV Unasyn and Zithromax ?-Breathing on room air at rest.  Check ambulatory oxygen requirement ?-Continue incentive spirometry, breathing exercises, Mucinex ?-Speech therapy eval obtained.  Dysphagia 2 diet recommended. ?Recent Labs  ?Lab 09/26/21 ?2150 09/27/21 ?0353 09/28/21 ?0019  ?WBC 9.4 9.2 5.9  ? ? ?AKI (acute kidney injury)   ?-Likely prerenal due to volume depletion ?-Improving with IV fluid ?Recent Labs  ?  08/17/21 ?1452 09/26/21 ?2356 09/27/21 ?0009 09/27/21 ?0353 09/28/21 ?0019  ?BUN '15 13 17 12 9  '$ ?CREATININE 0.94 1.50* 1.50* 1.11 1.00  ? ? ?Acute metabolic encephalopathy ?-likely secondary to pneumonia, AKI, alcohol use ?-Ammonia level normal ?-Mental status seems to have improved ? ?Chronic alcohol use ?-Continues to drink daily.   ?-Watch out for alcohol withdrawal symptoms.  Currently on CIWA protocol with as needed IV Ativan ? ?Acute on chronic anemia ?-Baseline hemoglobin more than 10, presented with hemoglobin of 8.9, no active bleeding.  Continue to monitor ?-Obtain anemia panel. ?Recent Labs  ?  08/17/21 ?1452 09/26/21 ?2150 09/27/21 ?0009 09/27/21 ?0353 09/28/21 ?0019  ?HGB 11.8* 8.9* 10.5* 9.1* 9.6*  ?MCV 76.7* 77.9*  --  73.7* 74.2*  ? ? ?History of CVA with left-sided weakness ?Dyslipidemia ?-continue aspirin and statin  ? ?COPD (chronic obstructive pulmonary disease)   ?Current daily smoker ?-Counseled to quit alcohol.   ?-Continue bronchodilators ? ?BPH (benign prostatic hyperplasia) ?-continue Flomax. ? ?Impaired mobility ?-Pending PT eval ? ?Goals of care ?  Code Status: Full Code  ? ? ?Mobility: Encourage ambulation.  Uses a cane at home. ? ?Nutritional status:  ?There  is no height or weight on file to calculate BMI.  ?  ?  ? ? ? ? ?Diet:  ?Diet Order   ? ?       ?  DIET DYS 2 Room service appropriate? Yes with Assist; Fluid consistency: Thin  Diet effective now       ?  ? ?  ?  ? ?  ? ? ?DVT prophylaxis:  ?enoxaparin (LOVENOX) injection 40 mg Start: 09/27/21  1000 ?  ?Antimicrobials: IV Unasyn, azithromycin IV ?Fluid: NS at 75 mill per hour ?Consultants: None ?Family Communication: None at bedside ? ?Status is: Inpatient ? ?Continue in-hospital care because: Ongoing care ?Level of care: Telemetry Medical  ? ?Dispo: The patient is from: Home ?             Anticipated d/c is to: Hopefully home in 1 to 2 days ?             Patient currently is not medically stable to d/c. ?  Difficult to place patient No ? ? ? ? ?Infusions:  ? sodium chloride 125 mL/hr at 09/27/21 1805  ? ampicillin-sulbactam (UNASYN) IV 3 g (09/28/21 6659)  ? azithromycin 500 mg (09/28/21 0137)  ? sodium chloride    ? ? ?Scheduled Meds: ? aspirin EC  81 mg Oral Daily  ? atorvastatin  40 mg Oral Daily  ? enoxaparin (LOVENOX) injection  40 mg Subcutaneous Q24H  ? guaiFENesin  600 mg Oral BID  ? multivitamin with minerals  1 tablet Oral Daily  ? ? ?PRN meds: ?acetaminophen **OR** acetaminophen, albuterol, LORazepam, magnesium hydroxide, ondansetron **OR** ondansetron (ZOFRAN) IV, traZODone  ? ?Antimicrobials: ?Anti-infectives (From admission, onward)  ? ? Start     Dose/Rate Route Frequency Ordered Stop  ? 09/27/21 1000  Ampicillin-Sulbactam (UNASYN) 3 g in sodium chloride 0.9 % 100 mL IVPB       ? 3 g ?200 mL/hr over 30 Minutes Intravenous Every 8 hours 09/27/21 0222    ? 09/27/21 0230  azithromycin (ZITHROMAX) 500 mg in sodium chloride 0.9 % 250 mL IVPB       ? 500 mg ?250 mL/hr over 60 Minutes Intravenous Every 24 hours 09/27/21 0222 10/02/21 0229  ? 09/27/21 0045  Ampicillin-Sulbactam (UNASYN) 3 g in sodium chloride 0.9 % 100 mL IVPB       ? 3 g ?200 mL/hr over 30 Minutes Intravenous STAT 09/27/21 0030 09/27/21 0256  ? 09/26/21 2315  amoxicillin-clavulanate (AUGMENTIN) 875-125 MG per tablet 1 tablet       ? 1 tablet Oral  Once 09/26/21 2314 09/26/21 2354  ? ?  ? ? ?Objective: ?Vitals:  ? 09/28/21 0732 09/28/21 1224  ?BP: (!) 159/100 (!) 141/96  ?Pulse: 86 88  ?Resp:  16  ?Temp: 98.4 ?F (36.9 ?C) 98.3 ?F  (36.8 ?C)  ?SpO2: 100% 98%  ? ? ?Intake/Output Summary (Last 24 hours) at 09/28/2021 1358 ?Last data filed at 09/28/2021 1045 ?Gross per 24 hour  ?Intake 3621.71 ml  ?Output 2101 ml  ?Net 1520.71 ml  ? ? ?There were no vitals filed for this visit. ?Weight change:  ?There is no height or weight on file to calculate BMI.  ? ?Physical Exam: ?General exam: Pleasant elderly African-American male.  Not in physical distress ?Skin: No rashes, lesions or ulcers. ?HEENT: Atraumatic, normocephalic, no obvious bleeding ?Lungs: Clear to auscultation bilaterally ?CVS: Regular rate and rhythm, no murmur ?GI/Abd soft, nontender, nondistended, bowel sound present ?CNS: Alert, awake, oriented to place and person.  Slow to respond ?Psychiatry: Sad affect ?Extremities: No pedal edema, no calf tenderness ? ?Data Review: I have personally reviewed the laboratory data and studies available. ? ?F/u labs ordered ?Unresulted Labs (From admission, onward)  ? ? None  ? ?  ? ? ?Signed, ?Terrilee Croak, MD ?[facility] ?09/28/2021 ? ? ? ? ? ? ? ? ? ? ? ? ?

## 2021-09-28 NOTE — Evaluation (Signed)
**Note Peter-Identified via Obfuscation** Physical Therapy Evaluation & Discharge ?Patient Details ?Name: DEMONTREZ [name] ?MRN: [id number] ?DOB: [birth year] ?Today's Date: 09/28/2021 ? ?History of Present Illness ? 71 y/o male presented to ED on 09/27/21 for confusion and lightheadedness. CT head negative. CXR shows possible pneumonia. Also found to have AKI. PMH: HTN, CVA, T2DM  ?Clinical Impression ? Patient admitted with the above. Patient functioning at modI level for mobility with no AD. Patient mobilizing well but limited by decreased activity tolerance. Encouraged continued mobility with nursing staff. Patient with no questions or concerns about returning home when medically ready. No further skilled PT needs identified acutely. No PT follow up recommended at this time.    ?   ? ?Recommendations for follow up therapy are one component of a multi-disciplinary discharge planning process, led by the attending physician.  Recommendations may be updated based on patient status, additional functional criteria and insurance authorization. ? ?Follow Up Recommendations No PT follow up ? ?  ?Assistance Recommended at Discharge None  ?Patient can return home with the following ?   ? ?  ?Equipment Recommendations None recommended by PT  ?Recommendations for Other Services ?    ?  ?Functional Status Assessment Patient has not had a recent decline in their functional status  ? ?  ?Precautions / Restrictions Precautions ?Precautions: None ?Restrictions ?Weight Bearing Restrictions: No  ? ?  ? ?Mobility ? Bed Mobility ?Overal bed mobility: Modified Independent ?  ?  ?  ?  ?  ?  ?  ?  ? ?Transfers ?Overall transfer level: Modified independent ?Equipment used: None ?  ?  ?  ?  ?  ?  ?  ?  ?  ? ?Ambulation/Gait ?Ambulation/Gait assistance: Modified independent (Device/Increase time) ?Gait Distance (Feet): 200 Feet ?Assistive device: None ?Gait Pattern/deviations: WFL(Within Functional Limits) ?  ?Gait velocity interpretation: >2.62 ft/sec, indicative of community  ambulatory ?  ?  ? ?Stairs ?  ?  ?  ?  ?  ? ?Wheelchair Mobility ?  ? ?Modified Rankin (Stroke Patients Only) ?  ? ?  ? ?Balance Overall balance assessment: Mild deficits observed, not formally tested ?  ?  ?  ?  ?  ?  ?  ?  ?  ?  ?  ?  ?  ?  ?  ?  ?  ?  ?   ? ? ? ?Pertinent Vitals/Pain Pain Assessment ?Pain Assessment: No/denies pain  ? ? ?Home Living Family/patient expects to be discharged to:: Private residence ?Living Arrangements: Non-relatives/Friends ?Available Help at Discharge: Family;Friend(s);Available PRN/intermittently ?Type of Home: House ?Home Access: Stairs to enter ?Entrance Stairs-Rails: Left ?Entrance Stairs-Number of Steps: 3 ?  ?Home Layout: One level ?Home Equipment: Kasandra Knudsen - single point ?   ?  ?Prior Function Prior Level of Function : Independent/Modified Independent ?  ?  ?  ?  ?  ?  ?Mobility Comments: uses cane most of the time ?  ?  ? ? ?Hand Dominance  ?   ? ?  ?Extremity/Trunk Assessment  ? Upper Extremity Assessment ?Upper Extremity Assessment: Overall WFL for tasks assessed ?  ? ?Lower Extremity Assessment ?Lower Extremity Assessment: Overall WFL for tasks assessed ?  ? ?Cervical / Trunk Assessment ?Cervical / Trunk Assessment: Normal  ?Communication  ? Communication: No difficulties  ?Cognition Arousal/Alertness: Awake/alert ?Behavior During Therapy: [facility] for tasks assessed/performed ?Overall Cognitive Status: Within Functional Limits for tasks assessed ?  ?  ?  ?  ?  ?  ?  ?  ?  ?  ?  ?  ?  ?  ?  ?  ?  ?  ?  ? ?  ?  General Comments   ? ?  ?Exercises    ? ?Assessment/Plan  ?  ?PT Assessment Patient does not need any further PT services  ?PT Problem List   ? ?   ?  ?PT Treatment Interventions     ? ?PT Goals (Current goals can be found in the Care Plan section)  ?Acute Rehab PT Goals ?Patient Stated Goal: to go home ?PT Goal Formulation: All assessment and education complete, DC therapy ? ?  ?Frequency   ?  ? ? ?Co-evaluation   ?  ?  ?  ?  ? ? ?  ?AM-PAC PT "6 Clicks" Mobility  ?Outcome  Measure Help needed turning from your back to your side while in a flat bed without using bedrails?: None ?Help needed moving from lying on your back to sitting on the side of a flat bed without using bedrails?: None ?Help needed moving to and from a bed to a chair (including a wheelchair)?: None ?Help needed standing up from a chair using your arms (e.g., wheelchair or bedside chair)?: None ?Help needed to walk in hospital room?: None ?Help needed climbing 3-5 steps with a railing? : None ?6 Click Score: 24 ? ?  ?End of Session   ?Activity Tolerance: Patient tolerated treatment well ?Patient left: in bed;with call bell/phone within reach ?Nurse Communication: Mobility status ?PT Visit Diagnosis: Muscle weakness (generalized) (M62.81);Unsteadiness on feet (R26.81) ?  ? ?Time: 6314-9702 ?PT Time Calculation (min) (ACUTE ONLY): 11 min ? ? ?Charges:   PT Evaluation ?$PT Eval Low Complexity: 1 Low ?  ?  ?   ? ? ?Walterine Amodei A. Gilford Rile, PT, DPT ?Acute Rehabilitation Services ?Pager [phone] ?Office [phone] ? ? ?Othelia Riederer A Conall Vangorder ?09/28/2021, 2:35 PM ? ?

## 2021-09-29 MED ORDER — SODIUM CHLORIDE 0.9 % IV SOLN
6.2500 mg | INTRAVENOUS | Status: AC
  Administered 2021-09-29: 6.25 mg via INTRAVENOUS
  Filled 2021-09-29: qty 0.25

## 2021-09-29 MED ORDER — GUAIFENESIN ER 600 MG PO TB12: 600.0000 mg | ORAL_TABLET | Freq: Two times a day (BID) | ORAL | 0 refills | Status: AC

## 2021-09-29 MED ORDER — ASPIRIN 81 MG PO TBEC
81.0000 mg | DELAYED_RELEASE_TABLET | Freq: Every day | ORAL | 2 refills | Status: AC
Start: 2021-09-30 — End: 2021-12-29

## 2021-09-29 MED ORDER — AMOXICILLIN-POT CLAVULANATE 875-125 MG PO TABS: 1.0000 | ORAL_TABLET | Freq: Two times a day (BID) | ORAL | 0 refills | Status: AC

## 2021-09-29 MED ORDER — SACCHAROMYCES BOULARDII 250 MG PO CAPS
250.0000 mg | ORAL_CAPSULE | Freq: Two times a day (BID) | ORAL | 0 refills | Status: AC
Start: 2021-09-29 — End: 2021-10-04

## 2021-09-29 NOTE — Discharge Summary (Signed)
? ?Physician Discharge Summary  ?Peter [name]:[id number] DOB: 09-13-1950 DOA: 09/26/2021 ? ?PCP: Sonia Side., FNP ? ?Admit date: 09/26/2021 ?Discharge date: 09/29/2021 ? ?Admitted From: Home ?Discharge disposition: Home ? ?Recommendations at discharge:  ?Stop drinking alcohol ? ?Brief narrative: ?Peter [name] is a 71 y.o. male with PMH significant for chronic daily alcohol use, chronic smoking, COPD, DM 2, HTN, HLD, stroke with left-sided weakness chronic anemia, anxiety/depression, chronic pain, impaired mobility ?Patient presented to the ED on 3/23 with complaint of altered mental status, lightheadedness, bilateral lower extremity edema, slurred speech, coughing, mild dyspnea. ?Continues to smoke half pack of cigarettes a day, drinks about a bottle of beer daily.  ? ?In the ED, patient was afebrile, heart rate 113, blood pressure 91/59, breathing on room air ?Labs with creatinine elevated to 1.5, hemoglobin 10.5, WBC normal, LFTs and ammonia levels normal ?EKG showed sinus tachycardia ?CT scan of head was unremarkable ?Chest x-ray suspicious for right lower lobe pneumonia ?Blood culture was sent.  Patient was started on IV antibiotics, IV fluid ?Admitted to hospitalist service ? ?Subjective: ?Patient was seen and examined this morning.  Lying on bed.  Not in distress.  No new symptoms. ?PT eval obtained yesterday.  Recommended no follow-up. ? ?Active Problems: ?  Aspiration pneumonia ([place]) ?  AKI (acute kidney injury) ([place]) ?  Acute alcohol intoxication ([place]) ?  Acute metabolic encephalopathy ?  COPD (chronic obstructive pulmonary disease) ([place]) ?  Acute on chronic anemia ?  Dyslipidemia ?  BPH (benign prostatic hyperplasia) ?  Tobacco abuse ?  History of CVA (cerebrovascular accident) ?  ? ?Assessment and Plan: ?Aspiration pneumonia   ?-Presented with altered mental status, cough, shortness of breath, tachycardia in the setting of chronic alcohol abuse ?-Chest x-ray with right lower lobe infiltrates  suspicious for aspiration pneumonia ?-Currently on IV Unasyn and Zithromax.   ?-Clinically improving.   ?-Discharge on 5 more days of oral Augmentin. ?-Breathing on room air at rest.  Check ambulatory oxygen requirement ?-Continue incentive spirometry, breathing exercises, Mucinex ?-Speech therapy eval obtained.  ?Recent Labs  ?Lab 09/26/21 ?2150 09/27/21 ?0353 09/28/21 ?0019  ?WBC 9.4 9.2 5.9  ? ?AKI (acute kidney injury)   ?-Likely prerenal due to volume depletion ?-Improved with IV fluid. ?Recent Labs  ?  08/17/21 ?1452 09/26/21 ?2356 09/27/21 ?0009 09/27/21 ?0353 09/28/21 ?0019  ?BUN '15 13 17 12 9  '$ ?CREATININE 0.94 1.50* 1.50* 1.11 1.00  ? ?Acute metabolic encephalopathy ?-likely secondary to pneumonia, AKI, alcohol use ?-Ammonia level normal ?-Mental status seems to have improved ? ?Chronic alcohol use ?-Continues to drink daily.   ?-Watch out for alcohol withdrawal symptoms.  No withdrawal symptoms in the hospital.  Covered with CIWA protocol with as needed IV Ativan ? ?Acute on chronic anemia ?-Baseline hemoglobin more than 10, presented with hemoglobin of 8.9, no active bleeding.  Hemoglobin getting close to baseline. ?Recent Labs  ?  08/17/21 ?1452 09/26/21 ?2150 09/27/21 ?0009 09/27/21 ?0353 09/28/21 ?0019  ?HGB 11.8* 8.9* 10.5* 9.1* 9.6*  ?MCV 76.7* 77.9*  --  73.7* 74.2*  ? ?History of CVA with left-sided weakness ?Dyslipidemia ?-continue aspirin and statin  ? ?COPD (chronic obstructive pulmonary disease)   ?Current daily smoker ?-Counseled to quit alcohol.   ?-Continue bronchodilators ? ?BPH (benign prostatic hyperplasia) ?-continue Flomax. ? ? ? ?Wounds:  ?- ?Incision (Closed) 01/03/19 Arm Right (Active)  ?Date First Assessed/Time First Assessed: 01/03/19 0947   Location: Arm  Location Orientation: Right  ?  ?Assessments 01/03/2019  10:51 AM 01/03/2019  2:00 PM  ?Dressing Type Compression wrap Compression wrap  ?Dressing Clean;Dry;Intact Clean;Dry;Intact  ?Site / Wound Assessment Dressing in place /  Unable to assess Dressing in place / Unable to assess  ?Drainage Amount None None  ?Treatment -- Ice applied  ?   ?No Linked orders to display  ?   ?Incision (Closed) 03/31/19 Elbow Right (Active)  ?Date First Assessed/Time First Assessed: 03/31/19 1658   Location: Elbow  Location Orientation: Right  ?  ?Assessments 03/31/2019  5:26 PM 03/31/2019  6:30 PM  ?Dressing Type Compression wrap Compression wrap  ?Dressing Clean;Dry;Intact Clean;Dry;Intact  ?Site / Wound Assessment Dressing in place / Unable to assess Dressing in place / Unable to assess  ?Drainage Amount None None  ?Drainage Description No odor No odor  ?Treatment Ice applied --  ?   ?No Linked orders to display  ? ? ?Discharge Exam:  ? ?Vitals:  ? 09/29/21 0015 09/29/21 0310 09/29/21 0700 09/29/21 1120  ?BP: (!) 124/91 (!) 153/99 125/87 (!) 159/87  ?Pulse: (!) 102 93 93 92  ?Resp:  17  16  ?Temp:  98.2 ?F (36.8 ?C) 98.6 ?F (37 ?C) 98.3 ?F (36.8 ?C)  ?TempSrc:  Oral Oral Oral  ?SpO2: 97% 96% 95% 96%  ? ? ?There is no height or weight on file to calculate BMI.  ?General exam: Pleasant elderly African-American male.  Not in physical distress ?Skin: No rashes, lesions or ulcers. ?HEENT: Atraumatic, normocephalic, no obvious bleeding ?Lungs: Clear to auscultation bilaterally. ?CVS: Regular rate and rhythm, no murmur ?GI/Abd soft, nontender, nondistended, bowel sound present ?CNS: Alert, awake, oriented to place and person.  Slow to respond. ?Psychiatry: Sad affect ?Extremities: No pedal edema, no calf tenderness ? ?Follow ups:  ? ? Follow-up Information   ? ? Sonia Side., FNP .   ?Specialty: Family Medicine ?Contact information: ?[employer] ?[place] [id number] ?[phone] ? ? ?  ?  ? ? Sonia Side., FNP Follow up.   ?Specialty: Family Medicine ?Contact information: ?[employer] ?[place] [address] ?[phone] ? ? ?  ?  ? ?  ?  ? ?  ? ? ?Discharge Instructions:  ? ?Discharge Instructions   ? ? Call MD for:  difficulty breathing, headache  or visual disturbances   Complete by: As directed ?  ? Call MD for:  extreme fatigue   Complete by: As directed ?  ? Call MD for:  hives   Complete by: As directed ?  ? Call MD for:  persistant dizziness or light-headedness   Complete by: As directed ?  ? Call MD for:  persistant nausea and vomiting   Complete by: As directed ?  ? Call MD for:  severe uncontrolled pain   Complete by: As directed ?  ? Call MD for:  temperature >100.4   Complete by: As directed ?  ? Diet general   Complete by: As directed ?  ? Discharge instructions   Complete by: As directed ?  ? Stop alcohol ? ?General discharge instructions: ?Follow with Primary MD Sonia Side., FNP in 7 days  ?Please request your PCP  to go over your hospital tests, procedures, radiology results at the follow up. Please get your medicines reviewed and adjusted.  Your PCP may decide to repeat certain labs or tests as needed. ?Do not drive, operate heavy machinery, perform activities at heights, swimming or participation in water activities or provide baby sitting services if your were  admitted for syncope or siezures until you have seen by Primary MD or a Neurologist and advised to do so again. ?[place] Controlled Substance Reporting System database was reviewed. Do not drive, operate heavy machinery, perform activities at heights, swim, participate in water activities or provide baby-sitting services while on medications for pain, sleep and mood until your outpatient physician has reevaluated you and advised to do so again.  You are strongly recommended to comply with the dose, frequency and duration of prescribed medications. ?Activity: As tolerated with Full fall precautions use walker/cane & assistance as needed ?Avoid using any recreational substances like cigarette, tobacco, alcohol, or non-prescribed drug. ?If you experience worsening of your admission symptoms, develop shortness of breath, life threatening emergency, suicidal or homicidal  thoughts you must seek medical attention immediately by calling 911 or calling your MD immediately  if symptoms less severe. ?You must read complete instructions/literature along with all the possible adverse reac

## 2021-09-29 NOTE — Progress Notes (Signed)
Peter [name] is alert and oriented x4. Breathing is even and nonlabored. Educated patient on discharge instructions, stated his understanding. Removed PIV with no issues. Returned belongings that were locked in security; keys, 2 lighters, swiss army knife, and loose money. NT transported patient in w/c with all belongings to transportation outside.  ?

## 2021-10-02 LAB — CULTURE, BLOOD (ROUTINE X 2)
Culture: NO GROWTH
Culture: NO GROWTH
Special Requests: ADEQUATE
Special Requests: ADEQUATE

## 2021-12-31 ENCOUNTER — Other Ambulatory Visit: Payer: Self-pay | Admitting: Family

## 2021-12-31 DIAGNOSIS — F17219 Nicotine dependence, cigarettes, with unspecified nicotine-induced disorders: Secondary | ICD-10-CM

## 2022-01-28 ENCOUNTER — Inpatient Hospital Stay: Admission: RE | Admit: 2022-01-28 | Payer: Medicare Other | Source: Ambulatory Visit

## 2022-02-07 ENCOUNTER — Inpatient Hospital Stay: Admission: RE | Admit: 2022-02-07 | Payer: Medicare Other | Source: Ambulatory Visit

## 2022-03-04 ENCOUNTER — Ambulatory Visit
Admission: RE | Admit: 2022-03-04 | Discharge: 2022-03-04 | Disposition: A | Discharge status: Home / Self Care | Payer: Medicare Other | Source: Ambulatory Visit | Attending: Family | Admitting: Family

## 2022-03-04 DIAGNOSIS — F17219 Nicotine dependence, cigarettes, with unspecified nicotine-induced disorders: Secondary | ICD-10-CM

## 2022-03-11 ENCOUNTER — Telehealth: Payer: Self-pay | Admitting: Emergency Medicine

## 2022-03-11 NOTE — Telephone Encounter (Signed)
Spoke with pt and clarified appt date and time for Dr Lamonte Sakai 03/13/22 at 1:30. Pt verbalized understanding and had no further questions.

## 2022-03-11 NOTE — Telephone Encounter (Signed)
Pt called into front desk requesting an appt to "start treatment". I spoke with pt . He had a lung cancer screening CT scan ordered by Dustin Folks, FNP on 03/04/22. This was read as a Lung [place] 4X. I advised patient that I would call him back with an appointment .  Attempted to call pt back  but had to leave voicemail for pt to call back to discuss appt date and time. I have already added him to the schedule to see Dr Lamonte Sakai 03/13/22 1:30 per Dr Agustina Caroli approval. I will try pt again to confirm this appt with him.

## 2022-03-13 ENCOUNTER — Encounter: Payer: Self-pay | Admitting: Emergency Medicine

## 2022-03-13 ENCOUNTER — Ambulatory Visit (INDEPENDENT_AMBULATORY_CARE_PROVIDER_SITE_OTHER): Payer: Medicare Other | Admitting: Emergency Medicine

## 2022-03-13 DIAGNOSIS — J439 Emphysema, unspecified: Secondary | ICD-10-CM

## 2022-03-13 DIAGNOSIS — R911 Solitary pulmonary nodule: Secondary | ICD-10-CM | POA: Diagnosis not present

## 2022-03-13 MED ORDER — SPIRIVA RESPIMAT 1.25 MCG/ACT IN AERS: 2.0000 | INHALATION_SPRAY | Freq: Every day | RESPIRATORY_TRACT | 0 refills | Status: DC

## 2022-03-13 NOTE — Patient Instructions (Signed)
We will perform pulmonary function testing We will arrange for a PET scan/CT scan to further evaluate your right upper lobe pulmonary nodule. We will try starting a new inhaler called Spiriva.  You should take 2 puffs once daily every day at the same time as a maintenance medication.  Keep track of how this medicine affects her breathing so we can discuss next time. Keep albuterol available to use 2 puffs if needed for shortness of breath, chest tightness, wheezing. Work on decreasing your cigarettes.  Ultimate goal will be to stop altogether. Follow Dr. Lamonte Sakai next available after your testing so we can review and plan next steps in your evaluation.

## 2022-03-13 NOTE — Addendum Note (Signed)
Addended by: Gavin Potters R on: 03/13/2022 02:27 PM   Modules accepted: Orders

## 2022-03-13 NOTE — Assessment & Plan Note (Signed)
Right upper lobe irregular somewhat spiculated pulmonary nodule that has slowly been increasing in size going back to 2018.  Consistent with a slow-growing adenocarcinoma.  Unclear to me that he is a candidate for primary resection.  He needs pulmonary function testing to better define his degree of obstruction.  I will perform this as well as a PET scan/super D CT scan.  If he has significant COPD which I suspect then I will push for navigational bronchoscopy, tissue diagnosis.  We can decide after his diagnosis whether he should be referred to thoracic surgery or to radiation oncology.

## 2022-03-13 NOTE — Assessment & Plan Note (Signed)
Moderate to moderately severe obstruction on his pulmonary function testing from 2012.  He is only on albuterol as needed.  I will try adding Spiriva Respimat to see if he gets benefit.  Repeat PFT as above

## 2022-03-13 NOTE — Progress Notes (Signed)
Subjective:    Patient ID: Peter [name], male    DOB: [birth year], 71 y.o.   MRN: [id number]  HPI 71 year old man with history of tobacco use, currently smoking 4-5 cigarettes daily as (30 pack years), iron deficiency anemia, diabetes, cirrhosis, hypertension, COPD, CVA.  He is referred today for an abnormal CT scan of the chest.  Today he reports some exertional SOB with longer distance walk, when carrying objects. He has a daily cough, prod of mucous. Has been treated for AE's before, last was 5 months ago, admitted to [facility].   Lung cancer screening CT chest 03/04/2022 reviewed by me showed Centrilobular and paraseptal emphysema.  There is a 17 mm spiculated nodule in the posterior segment of the right upper lobe that has slowly enlarged over time.  It was 15 mm 12/2018, more linear and smaller 04/2017.  Pulmonary function testing 04/25/2011 reviewed by me shows moderately severe obstruction without a bronchodilator response, hyperinflated lung volumes and a decreased diffusion capacity that corrects to the normal range when adjusted for alveolar volume.   Review of Systems As per HPI  Past Medical History:  Diagnosis Date   Ambulates with cane    straight cane   Anemia    iron def.   Anxiety    no meds   Bone spur of other site    Left Foot   Cataract    surgery to remove   Chronic pain    Cirrhosis of liver (HCC)    Depression    no meds   Diabetes mellitus without complication (HCC)    Patient Denies - No meds   Emphysema of lung (HCC)    ETOH abuse    Glaucoma    Hard of hearing    Hyperlipemia    Hypertension    not on medication   Impaired mobility    uses cane   Pneumonia    x 1   Sinusitis    Stroke (HCC)    weakness left side   Total self care deficit    Denies - Performs ADLs without assistance per patient 03/30/19     Family History  Problem Relation Age of Onset   Breast cancer Mother    Diabetes Maternal Aunt    Heart disease Sister    Heart  disease Maternal Aunt      Social History   Socioeconomic History   Marital status: Legally Separated    Spouse name: Not on file   Number of children: 1   Years of education: Not on file   Highest education level: Not on file  Occupational History   Occupation: disabled  Tobacco Use   Smoking status: Every Day    Packs/day: 1.00    Years: 30.00    Total pack years: 30.00    Types: Cigarettes   Smokeless tobacco: Never   Tobacco comments:    4 - 5 cigarettes smoked daily ARJ 03/13/22  Vaping Use   Vaping Use: Never used  Substance and Sexual Activity   Alcohol use: Yes    Alcohol/week: 7.0 standard drinks of alcohol    Types: 7 Cans of beer per week   Drug use: Yes    Types: Cocaine    Comment: Last use 03/29/19   Sexual activity: Yes    Comment: no birth control  Other Topics Concern   Not on file  Social History Narrative   Not on file   Social Determinants of Health   Financial Resource  Strain: Not on file  Food Insecurity: Not on file  Transportation Needs: Not on file  Physical Activity: Not on file  Stress: Not on file  Social Connections: Not on file  Intimate Partner Violence: Not on file    Farming, roofing, [employer] [place], concrete.  Has lived in [place] and [place].  Significant mold exposure - many years, just moved away 4 months ago.  No [employer]   Allergies  Allergen Reactions   Poison Ivy Extract [Poison Ivy Extract] Rash     Outpatient Medications Prior to Visit  Medication Sig Dispense Refill   albuterol (VENTOLIN HFA) 108 (90 Base) MCG/ACT inhaler Inhale 2 puffs into the lungs every 6 (six) hours as needed for wheezing or shortness of breath.     atorvastatin (LIPITOR) 40 MG tablet Take 40 mg by mouth daily.     ferrous sulfate 325 (65 FE) MG tablet Take 325 mg by mouth daily.     mirtazapine (REMERON) 15 MG tablet Take 15 mg by mouth at bedtime.     montelukast (SINGULAIR) 10 MG tablet Take 10 mg by mouth daily.     tamsulosin (FLOMAX) 0.4 MG CAPS  capsule Take 0.4 mg by mouth daily.     No facility-administered medications prior to visit.         Objective:   Physical Exam  Vitals:   03/13/22 1331  BP: 132/70  Pulse: 92  Temp: 98.3 F (36.8 C)  TempSrc: Oral  SpO2: 97%  Weight: 126 lb 6.4 oz (57.3 kg)  Height: 5' 7.5" (1.715 m)    Gen: Pleasant, well-nourished, in no distress,  normal affect  ENT: No lesions,  mouth clear,  oropharynx clear, no postnasal drip  Neck: No JVD, no stridor  Lungs: No use of accessory muscles, no crackles or wheezing on normal respiration, no wheeze on forced expiration  Cardiovascular: RRR, heart sounds normal, no murmur or gallops, no peripheral edema  Musculoskeletal: No deformities, no cyanosis or clubbing  Neuro: alert, awake, non focal  Skin: Warm, no lesions or rash     Assessment & Plan:   Nodule of upper lobe of right lung Right upper lobe irregular somewhat spiculated pulmonary nodule that has slowly been increasing in size going back to 2018.  Consistent with a slow-growing adenocarcinoma.  Unclear to me that he is a candidate for primary resection.  He needs pulmonary function testing to better define his degree of obstruction.  I will perform this as well as a PET scan/super D CT scan.  If he has significant COPD which I suspect then I will push for navigational bronchoscopy, tissue diagnosis.  We can decide after his diagnosis whether he should be referred to thoracic surgery or to radiation oncology.    COPD (chronic obstructive pulmonary disease) (HCC) Moderate to moderately severe obstruction on his pulmonary function testing from 2012.  He is only on albuterol as needed.  I will try adding Spiriva Respimat to see if he gets benefit.  Repeat PFT as above   Baltazar Apo, MD, PhD 03/13/2022, 2:01 PM [facility] [phone] or if no answer before 7:00PM call [phone] For any issues after 7:00PM please call eLink [phone]

## 2022-03-28 ENCOUNTER — Encounter (HOSPITAL_COMMUNITY): Admission: RE | Admit: 2022-03-28 | Payer: Medicare Other | Source: Ambulatory Visit

## 2022-03-28 ENCOUNTER — Encounter (HOSPITAL_COMMUNITY): Payer: Medicare Other

## 2022-04-01 ENCOUNTER — Encounter (HOSPITAL_COMMUNITY)
Admission: RE | Admit: 2022-04-01 | Discharge: 2022-04-01 | Disposition: A | Discharge status: Home / Self Care | Payer: Medicare Other | Source: Ambulatory Visit | Attending: Emergency Medicine | Admitting: Emergency Medicine

## 2022-04-01 DIAGNOSIS — R911 Solitary pulmonary nodule: Secondary | ICD-10-CM | POA: Diagnosis not present

## 2022-04-01 LAB — GLUCOSE, CAPILLARY: Glucose-Capillary: 88 mg/dL (ref 70–99)

## 2022-04-01 MED ORDER — FLUDEOXYGLUCOSE F - 18 (FDG) INJECTION
7.0000 | Freq: Once | INTRAVENOUS | Status: AC | PRN
  Administered 2022-04-01: 6.86 via INTRAVENOUS

## 2022-04-23 ENCOUNTER — Ambulatory Visit: Payer: Medicare Other | Admitting: Emergency Medicine

## 2022-05-14 ENCOUNTER — Ambulatory Visit: Payer: Medicare Other | Admitting: Emergency Medicine

## 2022-06-16 ENCOUNTER — Ambulatory Visit: Payer: Medicare Other | Admitting: Emergency Medicine

## 2022-06-16 DIAGNOSIS — J439 Emphysema, unspecified: Secondary | ICD-10-CM

## 2022-06-16 DIAGNOSIS — R911 Solitary pulmonary nodule: Secondary | ICD-10-CM

## 2022-06-16 LAB — PULMONARY FUNCTION TEST
DL/VA % pred: 76 %
DL/VA: 3.16 ml/min/mmHg/L
DLCO cor % pred: 58 %
DLCO cor: 13.96 ml/min/mmHg
DLCO unc % pred: 58 %
DLCO unc: 13.96 ml/min/mmHg
FEF 25-75 Post: 0.93 L/sec
FEF 25-75 Pre: 1.27 L/sec
FEF2575-%Change-Post: -26 %
FEF2575-%Pred-Post: 41 %
FEF2575-%Pred-Pre: 56 %
FEV1-%Change-Post: -10 %
FEV1-%Pred-Post: 60 %
FEV1-%Pred-Pre: 67 %
FEV1-Post: 1.75 L
FEV1-Pre: 1.96 L
FEV1FVC-%Change-Post: -12 %
FEV1FVC-%Pred-Pre: 91 %
FEV6-%Change-Post: 0 %
FEV6-%Pred-Post: 78 %
FEV6-%Pred-Pre: 77 %
FEV6-Post: 2.91 L
FEV6-Pre: 2.91 L
FEV6FVC-%Change-Post: -2 %
FEV6FVC-%Pred-Post: 103 %
FEV6FVC-%Pred-Pre: 106 %
FVC-%Change-Post: 2 %
FVC-%Pred-Post: 75 %
FVC-%Pred-Pre: 73 %
FVC-Post: 2.98 L
FVC-Pre: 2.91 L
Post FEV1/FVC ratio: 59 %
Post FEV6/FVC ratio: 98 %
Pre FEV1/FVC ratio: 67 %
Pre FEV6/FVC Ratio: 100 %
RV % pred: 124 %
RV: 2.81 L
TLC % pred: 95 %
TLC: 6.12 L

## 2022-06-16 NOTE — Progress Notes (Signed)
Full PFT performed today. °

## 2022-06-16 NOTE — Patient Instructions (Signed)
Full PFT performed today. °

## 2022-06-20 ENCOUNTER — Ambulatory Visit (INDEPENDENT_AMBULATORY_CARE_PROVIDER_SITE_OTHER): Payer: Medicare Other | Admitting: Emergency Medicine

## 2022-06-20 ENCOUNTER — Encounter: Payer: Self-pay | Admitting: Emergency Medicine

## 2022-06-20 VITALS — BP 120/68 | HR 93 | Ht 67.0 in | Wt 123.4 lb

## 2022-06-20 DIAGNOSIS — J439 Emphysema, unspecified: Secondary | ICD-10-CM

## 2022-06-20 DIAGNOSIS — Z720 Tobacco use: Secondary | ICD-10-CM | POA: Diagnosis not present

## 2022-06-20 DIAGNOSIS — Z01818 Encounter for other preprocedural examination: Secondary | ICD-10-CM

## 2022-06-20 DIAGNOSIS — R911 Solitary pulmonary nodule: Secondary | ICD-10-CM

## 2022-06-20 MED ORDER — SPIRIVA RESPIMAT 1.25 MCG/ACT IN AERS: 2.0000 | INHALATION_SPRAY | Freq: Every day | RESPIRATORY_TRACT | 6 refills | Status: DC

## 2022-06-20 NOTE — Assessment & Plan Note (Signed)
Function testing is actually probably acceptable to consider referral for primary resection.  I talked him today about either surgical referral or navigational bronchoscopy.  He would prefer to have a tissue diagnosis before committing to surgery.  That said we will work on arranging navigational bronchoscopy with biopsies, hopefully for 07/14/2022.  Discussed with him the risk, benefits, rationale and he agrees and understands to proceed.

## 2022-06-20 NOTE — Progress Notes (Signed)
Subjective:    Patient ID: Peter [name], male    DOB: October 02, 1950, 72 y.o.   MRN: [id number]  HPI 71 year old man with history of tobacco use, currently smoking 4-5 cigarettes daily as (30 pack years), iron deficiency anemia, diabetes, cirrhosis, hypertension, COPD, CVA.  He is referred today for an abnormal CT scan of the chest.  Today he reports some exertional SOB with longer distance walk, when carrying objects. He has a daily cough, prod of mucous. Has been treated for AE's before, last was 5 months ago, admitted to [facility].   Lung cancer screening CT chest 03/04/2022 reviewed by me showed Centrilobular and paraseptal emphysema.  There is a 17 mm spiculated nodule in the posterior segment of the right upper lobe that has slowly enlarged over time.  It was 15 mm 12/2018, more linear and smaller 04/2017.  Pulmonary function testing 04/25/2011 reviewed by me shows moderately severe obstruction without a bronchodilator response, hyperinflated lung volumes and a decreased diffusion capacity that corrects to the normal range when adjusted for alveolar volume.   ROV 06/20/22 --follow-up visit for 71 year old gentleman with a history of tobacco use and COPD, also iron deficiency anemia, diabetes, cirrhosis, CVA.  I saw him in September for evaluation of a 17 mm spiculated pulmonary nodule on screening CT that has slowly increased in time on serial imaging.  We repeated a CT/PET scan, arrange for pulmonary function testing to help determine whether he might be a candidate for primary resection versus navigational bronchoscopy.  We also tried him empirically on Spiriva to see if he would get any benefit. - he states that he may have helped him some.   Pulmonary function testing performed on 06/16/2022 reviewed by me shows moderately severe obstruction with an FEV1 of 1.96 L (67% predicted), no bronchodilator response, normal lung volumes, decreased diffusion capacity that does not fully correct when adjusted  for alveolar volume (76% predicted).  CT chest 04/01/2022 reviewed by me shows spiculated right upper lobe pulmonary nodule 2.0 x 1.2 cm.  PET scan 04/01/2022 reviewed by me shows a 2.0 x 1.1 cm spiculated right upper lobe pulmonary nodule with low metabolic activity (SUV max 1.1).  No evidence of any distant disease.  No mediastinal adenopathy.   Review of Systems As per HPI  Past Medical History:  Diagnosis Date   Ambulates with cane    straight cane   Anemia    iron def.   Anxiety    no meds   Bone spur of other site    Left Foot   Cataract    surgery to remove   Chronic pain    Cirrhosis of liver (HCC)    Depression    no meds   Diabetes mellitus without complication ([place])    Patient Denies - No meds   Emphysema of lung (HCC)    ETOH abuse    Glaucoma    Hard of hearing    Hyperlipemia    Hypertension    not on medication   Impaired mobility    uses cane   Pneumonia    x 1   Sinusitis    Stroke (HCC)    weakness left side   Total self care deficit    Denies - Performs ADLs without assistance per patient 03/30/19     Family History  Problem Relation Age of Onset   Breast cancer Mother    Diabetes Maternal Aunt    Heart disease Sister    Heart disease  Maternal Aunt      Social History   Socioeconomic History   Marital status: Legally Separated    Spouse name: Not on file   Number of children: 1   Years of education: Not on file   Highest education level: Not on file  Occupational History   Occupation: disabled  Tobacco Use   Smoking status: Every Day    Packs/day: 1.00    Years: 30.00    Total pack years: 30.00    Types: Cigarettes   Smokeless tobacco: Never   Tobacco comments:    3-5 cigarettes per day ALS 12/15  Vaping Use   Vaping Use: Never used  Substance and Sexual Activity   Alcohol use: Yes    Alcohol/week: 7.0 standard drinks of alcohol    Types: 7 Cans of beer per week   Drug use: Yes    Types: Cocaine    Comment: Last use  03/29/19   Sexual activity: Yes    Comment: no birth control  Other Topics Concern   Not on file  Social History Narrative   Not on file   Social Determinants of Health   Financial Resource Strain: Not on file  Food Insecurity: Not on file  Transportation Needs: Not on file  Physical Activity: Not on file  Stress: Not on file  Social Connections: Not on file  Intimate Partner Violence: Not on file    Farming, roofing, [employer] [place], concrete.  Has lived in [place] and [place].  Significant mold exposure - many years, just moved away 4 months ago.  No [employer]   Allergies  Allergen Reactions   Poison Ivy Extract [Poison Ivy Extract] Rash     Outpatient Medications Prior to Visit  Medication Sig Dispense Refill   albuterol (VENTOLIN HFA) 108 (90 Base) MCG/ACT inhaler Inhale 2 puffs into the lungs every 6 (six) hours as needed for wheezing or shortness of breath.     apixaban (ELIQUIS) 5 MG TABS tablet Eliquis     atorvastatin (LIPITOR) 40 MG tablet Take 40 mg by mouth daily.     ferrous sulfate 325 (65 FE) MG tablet Take 325 mg by mouth daily.     mirtazapine (REMERON) 15 MG tablet Take 15 mg by mouth at bedtime.     montelukast (SINGULAIR) 10 MG tablet Take 10 mg by mouth daily.     tamsulosin (FLOMAX) 0.4 MG CAPS capsule Take 0.4 mg by mouth daily.     Tiotropium Bromide Monohydrate (SPIRIVA RESPIMAT) 1.25 MCG/ACT AERS Inhale 2 puffs into the lungs daily. (Patient not taking: Reported on 06/20/2022) 4 g 0   No facility-administered medications prior to visit.         Objective:   Physical Exam  Vitals:   06/20/22 1504  BP: 120/68  Pulse: 93  SpO2: 96%  Weight: 123 lb 6.4 oz (56 kg)  Height: '5\' 7"'$  (1.702 m)    Gen: Pleasant, well-nourished, in no distress,  normal affect  ENT: No lesions,  mouth clear,  oropharynx clear, no postnasal drip  Neck: No JVD, no stridor  Lungs: No use of accessory muscles, no crackles or wheezing on normal respiration, no wheeze on forced  expiration  Cardiovascular: RRR, heart sounds normal, no murmur or gallops, no peripheral edema  Musculoskeletal: No deformities, no cyanosis or clubbing  Neuro: alert, awake, non focal, stoic man  Skin: Warm, no lesions or rash     Assessment & Plan:   Nodule of upper lobe of right lung  Function testing is actually probably acceptable to consider referral for primary resection.  I talked him today about either surgical referral or navigational bronchoscopy.  He would prefer to have a tissue diagnosis before committing to surgery.  That said we will work on arranging navigational bronchoscopy with biopsies, hopefully for 07/14/2022.  Discussed with him the risk, benefits, rationale and he agrees and understands to proceed.  COPD (chronic obstructive pulmonary disease) ([place]) He did get benefit from the Spiriva Respimat.  We will restart it.  His pulmonary function testing is consistent with moderately severe obstruction.  We may decide to change to LABA/LAMA at some point going forward.  Tobacco abuse Continue to work on cessation   Baltazar Apo, MD, PhD 06/20/2022, 3:46 PM [facility] [phone] or if no answer before 7:00PM call [phone] For any issues after 7:00PM please call eLink [phone]

## 2022-06-20 NOTE — Addendum Note (Signed)
Addended by: Retia Passe on: 06/20/2022 03:57 PM   Modules accepted: Orders

## 2022-06-20 NOTE — H&P (View-Only) (Signed)
Subjective:    Patient ID: Peter [name], male    DOB: [birth year], 71 y.o.   MRN: [id number]  HPI 71 year old man with history of tobacco use, currently smoking 4-5 cigarettes daily as (30 pack years), iron deficiency anemia, diabetes, cirrhosis, hypertension, COPD, CVA.  He is referred today for an abnormal CT scan of the chest.  Today he reports some exertional SOB with longer distance walk, when carrying objects. He has a daily cough, prod of mucous. Has been treated for AE's before, last was 5 months ago, admitted to [facility].   Lung cancer screening CT chest 03/04/2022 reviewed by me showed Centrilobular and paraseptal emphysema.  There is a 17 mm spiculated nodule in the posterior segment of the right upper lobe that has slowly enlarged over time.  It was 15 mm 12/2018, more linear and smaller 04/2017.  Pulmonary function testing 04/25/2011 reviewed by me shows moderately severe obstruction without a bronchodilator response, hyperinflated lung volumes and a decreased diffusion capacity that corrects to the normal range when adjusted for alveolar volume.   ROV 06/20/22 --follow-up visit for 71 year old gentleman with a history of tobacco use and COPD, also iron deficiency anemia, diabetes, cirrhosis, CVA.  I saw him in September for evaluation of a 17 mm spiculated pulmonary nodule on screening CT that has slowly increased in time on serial imaging.  We repeated a CT/PET scan, arrange for pulmonary function testing to help determine whether he might be a candidate for primary resection versus navigational bronchoscopy.  We also tried him empirically on Spiriva to see if he would get any benefit. - he states that he may have helped him some.   Pulmonary function testing performed on 06/16/2022 reviewed by me shows moderately severe obstruction with an FEV1 of 1.96 L (67% predicted), no bronchodilator response, normal lung volumes, decreased diffusion capacity that does not fully correct when adjusted  for alveolar volume (76% predicted).  CT chest 04/01/2022 reviewed by me shows spiculated right upper lobe pulmonary nodule 2.0 x 1.2 cm.  PET scan 04/01/2022 reviewed by me shows a 2.0 x 1.1 cm spiculated right upper lobe pulmonary nodule with low metabolic activity (SUV max 1.1).  No evidence of any distant disease.  No mediastinal adenopathy.   Review of Systems As per HPI  Past Medical History:  Diagnosis Date   Ambulates with cane    straight cane   Anemia    iron def.   Anxiety    no meds   Bone spur of other site    Left Foot   Cataract    surgery to remove   Chronic pain    Cirrhosis of liver (HCC)    Depression    no meds   Diabetes mellitus without complication ([place])    Patient Denies - No meds   Emphysema of lung (HCC)    ETOH abuse    Glaucoma    Hard of hearing    Hyperlipemia    Hypertension    not on medication   Impaired mobility    uses cane   Pneumonia    x 1   Sinusitis    Stroke (HCC)    weakness left side   Total self care deficit    Denies - Performs ADLs without assistance per patient 03/30/19     Family History  Problem Relation Age of Onset   Breast cancer Mother    Diabetes Maternal Aunt    Heart disease Sister    Heart disease  Maternal Aunt      Social History   Socioeconomic History   Marital status: Legally Separated    Spouse name: Not on file   Number of children: 1   Years of education: Not on file   Highest education level: Not on file  Occupational History   Occupation: disabled  Tobacco Use   Smoking status: Every Day    Packs/day: 1.00    Years: 30.00    Total pack years: 30.00    Types: Cigarettes   Smokeless tobacco: Never   Tobacco comments:    3-5 cigarettes per day ALS 12/15  Vaping Use   Vaping Use: Never used  Substance and Sexual Activity   Alcohol use: Yes    Alcohol/week: 7.0 standard drinks of alcohol    Types: 7 Cans of beer per week   Drug use: Yes    Types: Cocaine    Comment: Last use  03/29/19   Sexual activity: Yes    Comment: no birth control  Other Topics Concern   Not on file  Social History Narrative   Not on file   Social Determinants of Health   Financial Resource Strain: Not on file  Food Insecurity: Not on file  Transportation Needs: Not on file  Physical Activity: Not on file  Stress: Not on file  Social Connections: Not on file  Intimate Partner Violence: Not on file    Farming, roofing, [facility], concrete.  Has lived in [place] and [place].  Significant mold exposure - many years, just moved away 4 months ago.  No [employer]   Allergies  Allergen Reactions   Poison Ivy Extract [Poison Ivy Extract] Rash     Outpatient Medications Prior to Visit  Medication Sig Dispense Refill   albuterol (VENTOLIN HFA) 108 (90 Base) MCG/ACT inhaler Inhale 2 puffs into the lungs every 6 (six) hours as needed for wheezing or shortness of breath.     apixaban (ELIQUIS) 5 MG TABS tablet Eliquis     atorvastatin (LIPITOR) 40 MG tablet Take 40 mg by mouth daily.     ferrous sulfate 325 (65 FE) MG tablet Take 325 mg by mouth daily.     mirtazapine (REMERON) 15 MG tablet Take 15 mg by mouth at bedtime.     montelukast (SINGULAIR) 10 MG tablet Take 10 mg by mouth daily.     tamsulosin (FLOMAX) 0.4 MG CAPS capsule Take 0.4 mg by mouth daily.     Tiotropium Bromide Monohydrate (SPIRIVA RESPIMAT) 1.25 MCG/ACT AERS Inhale 2 puffs into the lungs daily. (Patient not taking: Reported on 06/20/2022) 4 g 0   No facility-administered medications prior to visit.         Objective:   Physical Exam  Vitals:   06/20/22 1504  BP: 120/68  Pulse: 93  SpO2: 96%  Weight: 123 lb 6.4 oz (56 kg)  Height: '5\' 7"'$  (1.702 m)    Gen: Pleasant, well-nourished, in no distress,  normal affect  ENT: No lesions,  mouth clear,  oropharynx clear, no postnasal drip  Neck: No JVD, no stridor  Lungs: No use of accessory muscles, no crackles or wheezing on normal respiration, no wheeze on forced  expiration  Cardiovascular: RRR, heart sounds normal, no murmur or gallops, no peripheral edema  Musculoskeletal: No deformities, no cyanosis or clubbing  Neuro: alert, awake, non focal, stoic man  Skin: Warm, no lesions or rash     Assessment & Plan:   Nodule of upper lobe of right lung  Function testing is actually probably acceptable to consider referral for primary resection.  I talked him today about either surgical referral or navigational bronchoscopy.  He would prefer to have a tissue diagnosis before committing to surgery.  That said we will work on arranging navigational bronchoscopy with biopsies, hopefully for 07/14/2022.  Discussed with him the risk, benefits, rationale and he agrees and understands to proceed.  COPD (chronic obstructive pulmonary disease) ([place]) He did get benefit from the Spiriva Respimat.  We will restart it.  His pulmonary function testing is consistent with moderately severe obstruction.  We may decide to change to LABA/LAMA at some point going forward.  Tobacco abuse Continue to work on cessation   Baltazar Apo, MD, PhD 06/20/2022, 3:46 PM [facility] [phone] or if no answer before 7:00PM call [phone] For any issues after 7:00PM please call eLink [phone]

## 2022-06-20 NOTE — Patient Instructions (Signed)
Will arrange for navigational bronchoscopy to evaluate a right upper lobe pulmonary nodule.  This will be done as an outpatient under general anesthesia at [facility] endoscopy.  You will need a designated driver.  You will need to stop your Eliquis 2 days prior to the procedure.  We will try to get this set up for 07/14/2022. We will restart Spiriva Respimat 2 puffs once daily. Keep your albuterol available use 2 puffs if needed for shortness of breath, chest tightness, wheezing. Work hard on decreasing your cigarette smoking Follow Dr. Lamonte Sakai in 1 month or next available

## 2022-06-20 NOTE — Assessment & Plan Note (Signed)
He did get benefit from the Spiriva Respimat.  We will restart it.  His pulmonary function testing is consistent with moderately severe obstruction.  We may decide to change to LABA/LAMA at some point going forward.

## 2022-06-20 NOTE — Assessment & Plan Note (Signed)
Continue to work on cessation

## 2022-07-10 ENCOUNTER — Encounter (HOSPITAL_COMMUNITY): Payer: Self-pay | Admitting: Emergency Medicine

## 2022-07-10 ENCOUNTER — Other Ambulatory Visit: Payer: Medicare Other

## 2022-07-10 DIAGNOSIS — Z01818 Encounter for other preprocedural examination: Secondary | ICD-10-CM

## 2022-07-10 NOTE — Progress Notes (Signed)
PCP - Dustin Folks, Brooke Bonito, [place] Cardiologist - n/a  CT Chest x-ray - 04/01/22 EKG - 09/26/21 Stress Test - n/a ECHO - 07/22/18 Cardiac Cath - n/a  ICD Pacemaker/Loop - n/a  Sleep Study -  n/a CPAP - none  Diabetes Type 2 - no meds   If your blood sugar is less than 70 mg/dL, you will need to treat for low blood sugar: Treat a low blood sugar (less than 70 mg/dL) with  cup of clear juice (cranberry or apple), 4 glucose tablets, OR glucose gel. Recheck blood sugar in 15 minutes after treatment (to make sure it is greater than 70 mg/dL). If your blood sugar is not greater than 70 mg/dL on recheck, call [phone] for further instructions.  Blood Thinner Instructions:  Follow your surgeon's instructions on when to stop Eliquis prior to surgery.  Last dose was on   Anesthesia review: Yes  STOP now taking any Aspirin (unless otherwise instructed by your surgeon), Aleve, Naproxen, Ibuprofen, Motrin, Advil, Goody's, BC's, all herbal medications, fish oil, and all vitamins.   Coronavirus Screening Do you have any of the following symptoms:  Cough yes/no: Yes Fever (>100.25F)  yes/no: No Runny nose yes/no: No Sore throat yes/no: No Difficulty breathing/shortness of breath  yes/no: Yes  Have you traveled in the last 14 days and where? yes/no: No  Patient verbalized understanding of instructions that were given via phone.

## 2022-07-11 ENCOUNTER — Encounter (HOSPITAL_COMMUNITY): Payer: Self-pay | Admitting: Emergency Medicine

## 2022-07-11 NOTE — Pre-Procedure Instructions (Signed)
PCP -  Cardiologist - Dr. Marylu Lund  EKG - DOS Chest x-ray - CT 04/01/22 ECHO - 07/22/18 Cardiac Cath - n/a CPAP - n/a  Blood Thinner Instructions: Eliquis hold 3 days  Aspirin Instructions: n/a ERAS Protcol - yes COVID TEST- DOS  Anesthesia review: yes  -------------  SDW INSTRUCTIONS:  Your procedure is scheduled on 07/14/22. Please report to [facility] Main Entrance "A" at [place].M., and check in at the Admitting office. Call this number if you have problems the morning of surgery: [phone]   Remember: Do not eat after midnight the night before your surgery  You may drink clear liquids until 0545 the morning of your surgery.   Clear liquids allowed are: Water, Non-Citrus Juices (without pulp), Carbonated Beverages, Clear Tea, Black Coffee Only, and Gatorade   Medications to take morning of surgery with a sip of water include: Atorvastatin, Singulair, Flomax, spiriva inhaler, albuterol PRN  Please bring all inhalers with you the day of surgery.    As of today, STOP taking any Aspirin (unless otherwise instructed by your surgeon), Aleve, Naproxen, Ibuprofen, Motrin, Advil, Goody's, BC's, all herbal medications, fish oil, and all vitamins.    The Morning of Surgery Do not wear jewelry, make-up or nail polish. Do not wear lotions, powders, or perfumes/colognes, or deodorant Do not bring valuables to the hospital. [facility] is not responsible for any belongings or valuables.  If you are a smoker, DO NOT Smoke 24 hours prior to surgery  If you wear a CPAP at night please bring your mask the morning of surgery   Remember that you must have someone to transport you home after your surgery, and remain with you for 24 hours if you are discharged the same day.  Please bring cases for contacts, glasses, hearing aids, dentures or bridgework because it cannot be worn into surgery.   Patients discharged the day of surgery will not be allowed to drive home.   Please  shower the NIGHT BEFORE/MORNING OF SURGERY (use antibacterial soap like DIAL soap if possible). Wear comfortable clothes the morning of surgery. Oral Hygiene is also important to reduce your risk of infection.  Remember - BRUSH YOUR TEETH THE MORNING OF SURGERY WITH YOUR REGULAR TOOTHPASTE  Patient denies shortness of breath, fever, cough and chest pain.

## 2022-07-11 NOTE — Progress Notes (Signed)
Anesthesia Chart Review:SAME DAY WORK-UP  Case: [id number] Date/Time: 07/14/22 0845   Procedure: ROBOTIC ASSISTED NAVIGATIONAL BRONCHOSCOPY   Anesthesia type: General   Pre-op diagnosis: RIGHT UPPER LOPE NODULE   Location: [facility] ROOM 3 / [place] [facility]   Surgeons: Collene Gobble, MD       DISCUSSION: Patient is a 72 year old male scheduled for the above procedure.   History includes smoking (1/2 PPD), COPD/emphysema, HLD, polysubstance abuse (ETOH, cigarettes, cocaine), cirrhosis, DM (denied; A1c 5.5 on 01/02/19), CVA (left sided weakness; history of CVA dating back to at least 2013), HTN (not on medication), glaucoma, iron deficiency anemia, hard of hearing, right olecranon fracture (s/p ORIF 01/03/19, repeat ORIF for non-union and removal of deep hardware 03/31/19). He had periodically been on Eliquis and ASA for history of CVA.  [facility] admission 09/26/21-09/29/21 for AMS and diagnosed with acute metabolic encephalopathy likely due to alcohol abuse, aspiration pneumonia, and AKI. Treated with Unasyn and Zithromax discharged on oral Augmentin.  He had a lung cancer screening chest CT in August 2023 that showed a spiculated nodule in the posterior segment RUL that had enlarged since 12/25/18, and indolent adenocarcinoma was favored. He was referred to pulmonologist Dr. Lamonte Sakai. Tissue diagnosed recommended to guide management.   I called him to clarify last Eliquis. He says his last "blood thinner" was on 07/09/22. He admits to only taking "on and off". He reported instructions to hold his blood thinner until after surgery. He reported smoking about 1/2/ PPD. He also said he uses crack cocaine occasionally, last dose 07/10/22. I asked him to refrain between now and surgery. He was not very forthcoming about alcohol use, but said he was not drinking that much currently--he may share a 24 oz beer with friends. He denied history of known alcohol withdrawal/DT's.   Anesthesia team to evaluate on  the day of surgery. He did confirm a family member would come with him on the day of surgery.   VS: Ht '5\' 7"'$  (1.702 m)   Wt 56 kg   BMI 19.34 kg/m  BP Readings from Last 3 Encounters:  06/20/22 120/68  03/13/22 132/70  09/29/21 (!) 159/87   Pulse Readings from Last 3 Encounters:  06/20/22 93  03/13/22 92  09/29/21 92     PROVIDERS: Sonia Side., FNP is PCP [facility])   LABS: For day of surgery as indicated. He denied known diabetes. H/H 9.6/31.4, Cr 1.00, glucose 92 on 09/28/21.    IMAGES: PET Scan 04/01/22: IMPRESSION: Spiculated RIGHT upper lobe pulmonary nodule remains indeterminate for low-grade adenocarcinoma. Pre endo- bronchoscopic evaluation.  CT Chest Lung Cancer Screen 03/04/22: IMPRESSION: 1. Lung-RADS 4X, highly suspicious. Additional imaging evaluation or consultation with Pulmonology or Thoracic Surgery recommended.   Spiculated nodule in the posterior segment right upper lobe has enlarged from 12/25/2018. A slightly more linear configuration was seen on 04/16/2017. Indolent adenocarcinoma is strongly favored. PET is recommended, as clinically indicated.   EKG: 09/26/21: ST at 107 bpm   CV: Echo 07/22/18 (ordered for evaluation of murmur): Study Conclusions - Left ventricle: The cavity size was normal. Wall thickness was   normal. Systolic function was normal. The estimated ejection   fraction was in the range of 55% to 60%. Wall motion was normal;   there were no regional wall motion abnormalities. Left   ventricular diastolic function parameters were normal. - Mitral valve: There was mild regurgitation.    Carotid US 01/14/13: Summary:  - The vertebral arteries appear  patent with antegrade flow.  - Findings consistent with less than 39 percent stenosis    involving the right internal carotid artery and the left    internal carotid artery.  - ICA/CCA ratio.    Right = 0.55. Left = 1.18.    Past Medical History:  Diagnosis Date    Ambulates with cane    straight cane   Anemia    iron def.   Anxiety    no meds   Bone spur of other site    Left Foot   Cataract    surgery to remove   Chronic pain    Cirrhosis of liver (HCC)    Depression    no meds   Diabetes mellitus without complication ([place])    Patient Denies - No meds   Emphysema of lung ([place])    ETOH abuse    Glaucoma    Hard of hearing    Hyperlipemia    Hypertension    not on medication   Impaired mobility    uses cane   Pneumonia 08/17/2021   x 1   Sinusitis    Stroke [facility])    weakness left side   Total self care deficit    Denies - Performs ADLs without assistance per patient 03/30/19    Past Surgical History:  Procedure Laterality Date   bone spur  2013   left spur   CATARACT EXTRACTION W/ INTRAOCULAR LENS IMPLANT Bilateral    COLONOSCOPY     ESOPHAGOGASTRODUODENOSCOPY  01/09/2012   Procedure: ESOPHAGOGASTRODUODENOSCOPY (EGD);  Surgeon: Beryle Beams, MD;  Location: [facility] ENDOSCOPY;  Service: Endoscopy;  Laterality: N/A;   IM NAILING TIBIA Right 04/16/2017   MOUTH SURGERY     MULTIPLE EXTRACTIONS WITH ALVEOLOPLASTY  03/29/2012   Procedure: MULTIPLE EXTRACION WITH ALVEOLOPLASTY;  Surgeon: Gae Bon, DDS;  Location: [place];  Service: Oral Surgery;  Laterality: Bilateral;   ORIF ELBOW FRACTURE Right 01/03/2019   Procedure: OPEN REDUCTION INTERNAL FIXATION (ORIF) ELBOW/OLECRANON FRACTURE;  Surgeon: [name], MD;  Location: [place];  Service: Orthopedics;  Laterality: Right;   ORIF ELBOW FRACTURE Right 03/31/2019   Procedure: right olecranon nonunion revision open reduction, internal fixation and surgery as indicated;  Surgeon: [name], MD;  Location: [place];  Service: Orthopedics;  Laterality: Right;  4mn   TIBIA IM NAIL INSERTION Right 04/16/2017   Procedure: INTRAMEDULLARY (IM) NAIL TIBIAL;  Surgeon: XLeandrew Koyanagi MD;  [place]  Service: Orthopedics;  Laterality: Right;    MEDICATIONS: No current  facility-administered medications for this encounter.    albuterol (VENTOLIN HFA) 108 (90 Base) MCG/ACT inhaler   apixaban (ELIQUIS) 5 MG TABS tablet   atorvastatin (LIPITOR) 40 MG tablet   ferrous sulfate 325 (65 FE) MG tablet   mirtazapine (REMERON) 15 MG tablet   montelukast (SINGULAIR) 10 MG tablet   tamsulosin (FLOMAX) 0.4 MG CAPS capsule   Tiotropium Bromide Monohydrate (SPIRIVA RESPIMAT) 1.25 MCG/ACT AERS    AMyra Gianotti PA-C Surgical Short Stay/Anesthesiology ML[facility] Palm BeachesPhone [phone] Care HospitalPhone ([phone]/11/2022 2:13 PM

## 2022-07-12 LAB — NOVEL CORONAVIRUS, NAA: SARS-CoV-2, NAA: NOT DETECTED

## 2022-07-12 LAB — SPECIMEN STATUS REPORT

## 2022-07-14 ENCOUNTER — Ambulatory Visit (HOSPITAL_BASED_OUTPATIENT_CLINIC_OR_DEPARTMENT_OTHER): Payer: Medicare Other | Admitting: Vascular Surgery

## 2022-07-14 ENCOUNTER — Ambulatory Visit (HOSPITAL_COMMUNITY): Payer: Medicare Other

## 2022-07-14 ENCOUNTER — Ambulatory Visit (HOSPITAL_COMMUNITY)
Admission: RE | Admit: 2022-07-14 | Discharge: 2022-07-14 | Disposition: A | Discharge status: Home / Self Care | Payer: Medicare Other | Source: Ambulatory Visit | Attending: Emergency Medicine | Admitting: Emergency Medicine

## 2022-07-14 ENCOUNTER — Encounter (HOSPITAL_COMMUNITY)
Admission: RE | Disposition: A | Discharge status: Home / Self Care | Payer: Self-pay | Source: Ambulatory Visit | Attending: Emergency Medicine

## 2022-07-14 ENCOUNTER — Other Ambulatory Visit: Payer: Self-pay

## 2022-07-14 ENCOUNTER — Ambulatory Visit (HOSPITAL_COMMUNITY): Payer: Medicare Other | Admitting: Vascular Surgery

## 2022-07-14 ENCOUNTER — Encounter (HOSPITAL_COMMUNITY): Payer: Self-pay | Admitting: Emergency Medicine

## 2022-07-14 DIAGNOSIS — I509 Heart failure, unspecified: Secondary | ICD-10-CM | POA: Insufficient documentation

## 2022-07-14 DIAGNOSIS — J449 Chronic obstructive pulmonary disease, unspecified: Secondary | ICD-10-CM | POA: Diagnosis not present

## 2022-07-14 DIAGNOSIS — R911 Solitary pulmonary nodule: Secondary | ICD-10-CM

## 2022-07-14 DIAGNOSIS — G8929 Other chronic pain: Secondary | ICD-10-CM | POA: Insufficient documentation

## 2022-07-14 DIAGNOSIS — F17210 Nicotine dependence, cigarettes, uncomplicated: Secondary | ICD-10-CM

## 2022-07-14 DIAGNOSIS — I110 Hypertensive heart disease with heart failure: Secondary | ICD-10-CM | POA: Diagnosis not present

## 2022-07-14 DIAGNOSIS — K7460 Unspecified cirrhosis of liver: Secondary | ICD-10-CM | POA: Insufficient documentation

## 2022-07-14 DIAGNOSIS — J439 Emphysema, unspecified: Secondary | ICD-10-CM | POA: Insufficient documentation

## 2022-07-14 DIAGNOSIS — I340 Nonrheumatic mitral (valve) insufficiency: Secondary | ICD-10-CM | POA: Diagnosis not present

## 2022-07-14 DIAGNOSIS — E119 Type 2 diabetes mellitus without complications: Secondary | ICD-10-CM | POA: Insufficient documentation

## 2022-07-14 DIAGNOSIS — E785 Hyperlipidemia, unspecified: Secondary | ICD-10-CM | POA: Diagnosis not present

## 2022-07-14 HISTORY — PX: FIDUCIAL MARKER PLACEMENT: SHX6858

## 2022-07-14 HISTORY — PX: BRONCHIAL BRUSHINGS: SHX5108

## 2022-07-14 HISTORY — PX: BRONCHIAL WASHINGS: SHX5105

## 2022-07-14 HISTORY — PX: VIDEO BRONCHOSCOPY WITH RADIAL ENDOBRONCHIAL ULTRASOUND: SHX6849

## 2022-07-14 HISTORY — PX: BRONCHIAL NEEDLE ASPIRATION BIOPSY: SHX5106

## 2022-07-14 HISTORY — PX: BRONCHIAL BIOPSY: SHX5109

## 2022-07-14 LAB — CBC
HCT: 38.9 % — ABNORMAL LOW (ref 39.0–52.0)
Hemoglobin: 12.1 g/dL — ABNORMAL LOW (ref 13.0–17.0)
MCH: 23.9 pg — ABNORMAL LOW (ref 26.0–34.0)
MCHC: 31.1 g/dL (ref 30.0–36.0)
MCV: 76.9 fL — ABNORMAL LOW (ref 80.0–100.0)
Platelets: 282 10*3/uL (ref 150–400)
RBC: 5.06 MIL/uL (ref 4.22–5.81)
RDW: 17.1 % — ABNORMAL HIGH (ref 11.5–15.5)
WBC: 4.9 10*3/uL (ref 4.0–10.5)
nRBC: 0 % (ref 0.0–0.2)

## 2022-07-14 LAB — BASIC METABOLIC PANEL
Anion gap: 6 (ref 5–15)
BUN: 12 mg/dL (ref 8–23)
CO2: 25 mmol/L (ref 22–32)
Calcium: 9 mg/dL (ref 8.9–10.3)
Chloride: 107 mmol/L (ref 98–111)
Creatinine, Ser: 1.04 mg/dL (ref 0.61–1.24)
GFR, Estimated: >60 mL/min (ref >60)
Glucose, Bld: 133 mg/dL — ABNORMAL HIGH (ref 70–99)
Potassium: 4.3 mmol/L (ref 3.5–5.1)
Sodium: 138 mmol/L (ref 135–145)

## 2022-07-14 LAB — GLUCOSE, CAPILLARY
Glucose-Capillary: 134 mg/dL — ABNORMAL HIGH (ref 70–99)
Glucose-Capillary: 69 mg/dL — ABNORMAL LOW (ref 70–99)
Glucose-Capillary: 107 mg/dL — ABNORMAL HIGH (ref 70–99)

## 2022-07-14 SURGERY — BRONCHOSCOPY, WITH BIOPSY USING ELECTROMAGNETIC NAVIGATION
Anesthesia: Monitor Anesthesia Care

## 2022-07-14 MED ORDER — DEXTROSE 50 % IV SOLN
12.5000 g | INTRAVENOUS | Status: AC
  Administered 2022-07-14: 12.5 g via INTRAVENOUS

## 2022-07-14 MED ORDER — PROPOFOL 10 MG/ML IV BOLUS
INTRAVENOUS | Status: DC | PRN
  Administered 2022-07-14: 100 mg via INTRAVENOUS

## 2022-07-14 MED ORDER — ONDANSETRON HCL 4 MG/2ML IJ SOLN
INTRAMUSCULAR | Status: DC | PRN
  Administered 2022-07-14: 4 mg via INTRAVENOUS

## 2022-07-14 MED ORDER — ROCURONIUM BROMIDE 10 MG/ML (PF) SYRINGE
PREFILLED_SYRINGE | INTRAVENOUS | Status: DC | PRN
  Administered 2022-07-14: 30 mg via INTRAVENOUS
  Administered 2022-07-14: 50 mg via INTRAVENOUS

## 2022-07-14 MED ORDER — DEXAMETHASONE SODIUM PHOSPHATE 10 MG/ML IJ SOLN
INTRAMUSCULAR | Status: DC | PRN
  Administered 2022-07-14: 10 mg via INTRAVENOUS

## 2022-07-14 MED ORDER — PHENYLEPHRINE HCL-NACL 20-0.9 MG/250ML-% IV SOLN
INTRAVENOUS | Status: DC | PRN
  Administered 2022-07-14: 60 ug/min via INTRAVENOUS

## 2022-07-14 MED ORDER — LACTATED RINGERS IV SOLN: INTRAVENOUS | Status: DC

## 2022-07-14 MED ORDER — APIXABAN 5 MG PO TABS: 5.0000 mg | ORAL_TABLET | Freq: Two times a day (BID) | ORAL | Status: DC

## 2022-07-14 MED ORDER — CHLORHEXIDINE GLUCONATE 0.12 % MT SOLN
OROMUCOSAL | Status: AC
  Administered 2022-07-14: 15 mL via OROMUCOSAL
  Filled 2022-07-14: qty 15

## 2022-07-14 MED ORDER — PROPOFOL 500 MG/50ML IV EMUL
INTRAVENOUS | Status: DC | PRN
  Administered 2022-07-14: 50 ug/kg/min via INTRAVENOUS

## 2022-07-14 MED ORDER — CHLORHEXIDINE GLUCONATE 0.12 % MT SOLN: 15.0000 mL | Freq: Once | OROMUCOSAL | Status: AC

## 2022-07-14 MED ORDER — DEXTROSE 50 % IV SOLN
INTRAVENOUS | Status: AC
  Filled 2022-07-14: qty 50

## 2022-07-14 MED ORDER — LIDOCAINE 2% (20 MG/ML) 5 ML SYRINGE
INTRAMUSCULAR | Status: DC | PRN
  Administered 2022-07-14: 50 mg via INTRAVENOUS

## 2022-07-14 MED ORDER — PHENYLEPHRINE 80 MCG/ML (10ML) SYRINGE FOR IV PUSH (FOR BLOOD PRESSURE SUPPORT)
PREFILLED_SYRINGE | INTRAVENOUS | Status: DC | PRN
  Administered 2022-07-14 (×5): 160 ug via INTRAVENOUS

## 2022-07-14 MED ORDER — SUGAMMADEX SODIUM 200 MG/2ML IV SOLN
INTRAVENOUS | Status: DC | PRN
  Administered 2022-07-14: 200 mg via INTRAVENOUS

## 2022-07-14 MED ORDER — FENTANYL CITRATE (PF) 100 MCG/2ML IJ SOLN
INTRAMUSCULAR | Status: DC | PRN
  Administered 2022-07-14 (×2): 50 ug via INTRAVENOUS

## 2022-07-14 SURGICAL SUPPLY — 1 items: SuperLock Fiducial Marker IMPLANT

## 2022-07-14 NOTE — Anesthesia Procedure Notes (Signed)
Procedure Name: Intubation Date/Time: 07/14/2022 9:04 AM  Performed by: Genelle Bal, CRNAPre-anesthesia Checklist: Patient identified, Emergency Drugs available, Suction available and Patient being monitored Patient Re-evaluated:Patient Re-evaluated prior to induction Oxygen Delivery Method: Circle system utilized Preoxygenation: Pre-oxygenation with 100% oxygen Induction Type: IV induction Ventilation: Mask ventilation without difficulty Laryngoscope Size: Miller and 2 Grade View: Grade I Tube type: Oral Tube size: 8.5 mm Number of attempts: 1 Airway Equipment and Method: Stylet and Oral airway Placement Confirmation: ETT inserted through vocal cords under direct vision, positive ETCO2 and breath sounds checked- equal and bilateral Secured at: 22 cm Tube secured with: Tape Dental Injury: Teeth and Oropharynx as per pre-operative assessment

## 2022-07-14 NOTE — Discharge Instructions (Signed)
Flexible Bronchoscopy, Care After This sheet gives you information about how to care for yourself after your test. Your doctor may also give you more specific instructions. If you have problems or questions, contact your doctor. Follow these instructions at home: Eating and drinking When your numbness is gone and your cough and gag reflexes have come back, you may: Eat only soft foods. Slowly drink liquids. The day after the test, go back to your normal diet. Driving Do not drive for 24 hours if you were given a medicine to help you relax (sedative). Do not drive or use heavy machinery while taking prescription pain medicine. General instructions  Take over-the-counter and prescription medicines only as told by your doctor. Return to your normal activities as told. Ask what activities are safe for you. Do not use any products that have nicotine or tobacco in them. This includes cigarettes and e-cigarettes. If you need help quitting, ask your doctor. Keep all follow-up visits as told by your doctor. This is important. It is very important if you had a tissue sample (biopsy) taken. Get help right away if: You have shortness of breath that gets worse. You get light-headed. You feel like you are going to pass out (faint). You have chest pain. You cough up: More than a little blood. More blood than before. Summary Do not eat or drink anything (not even water) for 2 hours after your test, or until your numbing medicine wears off. Do not use cigarettes. Do not use e-cigarettes. Get help right away if you have chest pain.  Please call our office for any questions or concerns.  [phone].  Okay to restart Eliquis on 07/15/2022.   This information is not intended to replace advice given to you by your health care provider. Make sure you discuss any questions you have with your health care provider. Document Released: 04/20/2009 Document Revised: 06/05/2017 Document Reviewed:  07/11/2016 Elsevier Patient Education  2020 [employer].

## 2022-07-14 NOTE — Op Note (Signed)
Video Bronchoscopy with Robotic Assisted Bronchoscopic Navigation   Date of Operation: 07/14/2022   Pre-op Diagnosis: Right upper lobe pulmonary nodule  Post-op Diagnosis: Same  Surgeon: Baltazar Apo  Assistants: None  Anesthesia: General endotracheal anesthesia  Operation: Flexible video fiberoptic bronchoscopy with robotic assistance and biopsies.  Estimated Blood Loss: Minimal  Complications: None  Indications and History: Peter [name] is a 72 y.o. male with history of tobacco use.  He participates in the lung cancer screening program and has a slowly enlarging spiculated posterior right upper lobe pulmonary nodule.  Recommendation was made to achieve a tissue diagnosis via robotic assisted navigational bronchoscopy. The risks, benefits, complications, treatment options and expected outcomes were discussed with the patient.  The possibilities of pneumothorax, pneumonia, reaction to medication, pulmonary aspiration, perforation of a viscus, bleeding, failure to diagnose a condition and creating a complication requiring transfusion or operation were discussed with the patient who freely signed the consent.    Description of Procedure: The patient was seen in the Preoperative Area, was examined and was deemed appropriate to proceed.  The patient was taken to [facility] endoscopy room 3, identified as Peter [name] and the procedure verified as Flexible Video Fiberoptic Bronchoscopy.  A Time Out was held and the above information confirmed.   Prior to the date of the procedure a high-resolution CT scan of the chest was performed. Utilizing ION software program a virtual tracheobronchial tree was generated to allow the creation of distinct navigation pathways to the patient's parenchymal abnormalities. After being taken to the operating room general anesthesia was initiated and the patient  was orally intubated. The video fiberoptic bronchoscope was introduced via the endotracheal tube and a general  inspection was performed which showed normal right and left lung anatomy. Aspiration of the bilateral mainstems was completed to remove any remaining secretions. Robotic catheter inserted into patient's endotracheal tube.   Target #1 right upper lobe pulmonary nodule: The distinct navigation pathways prepared prior to this procedure were then utilized to navigate to patient's lesion identified on CT scan. The robotic catheter was secured into place and the vision probe was withdrawn.  Lesion location was approximated using fluoroscopy and radial endobronchial ultrasound for peripheral targeting.  Local registration and targeting was performed using Cios three-dimensional imaging.  Tool in lesion was established using Cios Spin. Under fluoroscopic guidance transbronchial needle brushings, transbronchial needle biopsies, and transbronchial forceps biopsies were performed to be sent for cytology and pathology.  Under fluoroscopic guidance a single fiducial marker was placed adjacent to the pulmonary nodule.  Finally a bronchioalveolar lavage was performed in the right upper lobe and sent for cytology.   At the end of the procedure a general airway inspection was performed and there was no evidence of active bleeding. The bronchoscope was removed.  The patient tolerated the procedure well. There was no significant blood loss and there were no obvious complications. A post-procedural chest x-ray is pending.  Samples Target #1: 1. Transbronchial needle brushings from right upper lobe pulmonary nodule 2. Transbronchial Wang needle biopsies from right upper lobe pulmonary nodule 3. Transbronchial forceps biopsies from right upper lobe pulmonary nodule 4. Bronchoalveolar lavage from right upper lobe   Plans:  The patient will be discharged from the PACU to home when recovered from anesthesia and after chest x-ray is reviewed. We will review the cytology, pathology and microbiology results with the patient  when they become available. Outpatient followup will be with Dr. Lamonte Sakai.   Baltazar Apo, MD, PhD 07/14/2022,  10:06 AM [facility] [phone] or if no answer before 7:00PM call [phone] For any issues after 7:00PM please call eLink [phone]

## 2022-07-14 NOTE — Research (Signed)
Title: A multi-center, prospective, single-arm, observational study to evaluate real-world outcomes for the shape-sensing Ion endoluminal system  Primary Outcome: Evaluate procedure characteristics and short and long-term patient outcomes following shape-sensing robotic-assisted bronchoscopy (ssRAB) utilizing the Ion Endoluminal System for lung lesion localization or biopsy.   Protocol # / Study Name: ISI-ION-003 Clinical Trials #: [id number] Sponsor: [place] Investigator: Dr. Leory Plowman Icard  Key Features of Ion Endoluminal System (referred to as "Ion") Ion is the first FDA cleared bronchoscopy system that uses fiber optic shape sensing technology to inform on location within the airways. Its catheter/tool channel has a smaller outer diameter (3.5 mm) in comparison to conventional bronchoscopes, allowing it to navigate into the smaller airways of the periphery.     Key Inclusion Criteria Subject is 18 years or older at the time of the procedure Subject is a candidate for a planned, elective RAB lung lesion localization or biopsy procedure in which the Ion Endoluminal System is planned to be utilized.  Subject  able to understand and adhere to study requirements and provide informed consent.   Key Exclusion Criteria Subject is under the care of a Herbalist and is unable to provide informed consent on their own accord.  Subject is participating in an interventional research study or research study with investigational agents with an unknown safety profile that would interfere with participation or the results of this study.  Male subjects who are pregnant or nursing at the time of the index Ion procedure, as determined by standard site practices. Subjects that are incarcerated or institutionalized under court order, or other vulnerable populations.    Previous Clinical Trials Since receiving FDA clearance in Feb 2019, Ion has been adopted  commercially by over 226 centers in the [place], and utilized in over 40,000 procedures.  The first in-human study enrolled 38 subjects with a mean lesion size of 14.8 mm and the overall diagnostic yield was 79.3%, with no adverse events. 17 (58.6%) lesions were reported to have a bronchus sign available on CT imaging.  A multi-center study published results in 2022, with 270 lesions biopsied in 241 patients using Ion. The mean largest cardinal lesion size was 18.86.39m, and the mean airway generation count was 7.01.6. Asymptomatic pneumothorax occurred in 3.3% of subjects, and 0.8% experienced airway bleeding.   Another study provided preliminary results in 2022, with 87% sensitivity for malignancy, a diagnostic yield of 81%, and a mean lesion size of 16 mm. 75% of biopsy cases were bronchus-sign negative. 4% of subjects experienced pneumothoraces (including those requiring intervention), and 0.8% of subjects experienced airway bleeding requiring wedging or balloon tamponade.  A single-center study captured 131 consecutive procedures of pulmonary biopsy using Ion. The navigational success rate was 98.7%, with an overall diagnostic yield of 81.7%, an overall complication rate of 3%, and a pneumothorax rate of 1.5%.   [facility] @ [place]rensburgCoordinator note:   This visit for Subject JQUINTYN DOMBEKwith DOB: [birth year]on 07/14/2022 for the above protocol is Visit/Encounter # Pre-Procedure, Intra-procedure, Post-procedure  and is for purpose of research.   The consent for this encounter is under:  Protocol Version 1.0 Investigator Brochure Version N/A Consent Version Revision A, dated [id number] is currently IRB approved.   JVenia Carbonexpressed continued interest and consent in continuing as a study subject. Subject confirmed that there was no change in contact information (e.g. address, telephone, email). Subject thanked for participation in research and contribution to  science. In this  visit 07/14/2022 the subject will be evaluated by Sub-Investigator named Dr. Baltazar Apo. This research coordinator has verified that the above investigator is up to date with his/her training logs.   The Subject was informed that the PI continues to have oversight of the subject's visits and course through relevant discussions, reviews, and also specifically of this visit by routing of this note to the PI.  The research study was discussed with the subject in the pre-operative room. The study was explained in detail including all the contents of the informed consent document. The subject was encouraged to ask questions. All questions were answered to their satisfaction. The IRB approved informed consent was signed, and a copy was given to the subject. After obtaining consent, the subject underwent scheduled procedure using the ion endoluminal system. Data collection was completed per protocol. Refer to paper source subject binder for further details.      Signed by  Jose Persia Clinical Research Coordinator / Sub-Investigator  [facility]  [place], [place] 1:35 PM 07/14/2022

## 2022-07-14 NOTE — Transfer of Care (Signed)
Immediate Anesthesia Transfer of Care Note  Patient: Peter [name]  Procedure(s) Performed: ROBOTIC ASSISTED NAVIGATIONAL BRONCHOSCOPY VIDEO BRONCHOSCOPY WITH RADIAL ENDOBRONCHIAL ULTRASOUND BRONCHIAL BIOPSIES BRONCHIAL BRUSHINGS BRONCHIAL NEEDLE ASPIRATION BIOPSIES FIDUCIAL MARKER PLACEMENT BRONCHIAL WASHINGS  Patient Location: PACU  Anesthesia Type:General  Level of Consciousness: drowsy and patient cooperative  Airway & Oxygen Therapy: Patient Spontanous Breathing and Patient connected to face mask oxygen  Post-op Assessment: Report given to RN and Post -op Vital signs reviewed and stable  Post vital signs: Reviewed and stable  Last Vitals:  Vitals Value Taken Time  BP 126/68 07/14/22 1016  Temp    Pulse 83 07/14/22 1018  Resp 12 07/14/22 1018  SpO2 100 % 07/14/22 1018  Vitals shown include unvalidated device data.  Last Pain:  Vitals:   07/14/22 0710  PainSc: 0-No pain      Patients Stated Pain Goal: 0 (35/52/17 4715)  Complications: No notable events documented.

## 2022-07-14 NOTE — Anesthesia Preprocedure Evaluation (Addendum)
Anesthesia Evaluation  Patient identified by MRN, date of birth, ID band Patient awake    Reviewed: Allergy & Precautions, NPO status , Patient's Chart, lab work & pertinent test results  History of Anesthesia Complications Negative for: history of anesthetic complications  Airway Mallampati: I  TM Distance: >3 FB Neck ROM: Full    Dental  (+) Edentulous Upper, Edentulous Lower, Dental Advisory Given   Pulmonary COPD,  COPD inhaler, Current Smoker   breath sounds clear to auscultation       Cardiovascular hypertension, (-) angina +CHF   Rhythm:Regular  Left ventricle: The cavity size was normal. Wall thickness was    normal. Systolic function was normal. The estimated ejection    fraction was in the range of 55% to 60%. Wall motion was normal;    there were no regional wall motion abnormalities. Left    ventricular diastolic function parameters were normal.  - Mitral valve: There was mild regurgitation.     Neuro/Psych CVA    GI/Hepatic negative GI ROS, Neg liver ROS,,,  Endo/Other  diabetes  Lab Results      Component                Value               Date                      HGBA1C                   5.5                 01/02/2019             Renal/GU Lab Results      Component                Value               Date                      CREATININE               1.04                07/14/2022           Lab Results      Component                Value               Date                      K                        4.3                 07/14/2022                Musculoskeletal negative musculoskeletal ROS (+)    Abdominal   Peds  Hematology  (+) Blood dyscrasia, anemia Lab Results      Component                Value               Date                      WBC  4.9                 07/14/2022                HGB                      12.1 (L)            07/14/2022                HCT                       38.9 (L)            07/14/2022                MCV                      76.9 (L)            07/14/2022                PLT                      282                 07/14/2022             eliquis   Anesthesia Other Findings   Reproductive/Obstetrics                             Anesthesia Physical Anesthesia Plan  ASA: 2  Anesthesia Plan: MAC   Post-op Pain Management: Minimal or no pain anticipated   Induction: Intravenous  PONV Risk Score and Plan: 0 and Propofol infusion, TIVA, Treatment may vary due to age or medical condition and Ondansetron  Airway Management Planned: Oral ETT  Additional Equipment: None  Intra-op Plan:   Post-operative Plan: Extubation in OR  Informed Consent: I have reviewed the patients History and Physical, chart, labs and discussed the procedure including the risks, benefits and alternatives for the proposed anesthesia with the patient or authorized representative who has indicated his/her understanding and acceptance.     Dental advisory given  Plan Discussed with: CRNA  Anesthesia Plan Comments:        Anesthesia Quick Evaluation

## 2022-07-14 NOTE — Interval H&P Note (Signed)
History and Physical Interval Note:  07/14/2022 8:48 AM  Peter [name]  has presented today for surgery, with the diagnosis of RIGHT UPPER LOPE NODULE.  The various methods of treatment have been discussed with the patient and family. After consideration of risks, benefits and other options for treatment, the patient has consented to  Procedure(s): ROBOTIC ASSISTED NAVIGATIONAL BRONCHOSCOPY (N/A) as a surgical intervention.  The patient's history has been reviewed, patient examined, no change in status, stable for surgery.  I have reviewed the patient's chart and labs.  Questions were answered to the patient's satisfaction.     Collene Gobble

## 2022-07-15 ENCOUNTER — Other Ambulatory Visit: Payer: Self-pay | Admitting: *Deleted

## 2022-07-15 MED ORDER — APIXABAN 5 MG PO TABS: 5.0000 mg | ORAL_TABLET | Freq: Two times a day (BID) | ORAL | 3 refills | Status: DC

## 2022-07-16 ENCOUNTER — Encounter (HOSPITAL_COMMUNITY): Payer: Self-pay | Admitting: Emergency Medicine

## 2022-07-16 LAB — CYTOLOGY - NON PAP

## 2022-07-16 NOTE — Anesthesia Postprocedure Evaluation (Signed)
Anesthesia Post Note  Patient: ZADKIEL DRAGAN  Procedure(s) Performed: ROBOTIC ASSISTED NAVIGATIONAL BRONCHOSCOPY VIDEO BRONCHOSCOPY WITH RADIAL ENDOBRONCHIAL ULTRASOUND BRONCHIAL BIOPSIES BRONCHIAL BRUSHINGS BRONCHIAL NEEDLE ASPIRATION BIOPSIES FIDUCIAL MARKER PLACEMENT BRONCHIAL WASHINGS     Patient location during evaluation: PACU Anesthesia Type: General Level of consciousness: awake and patient cooperative Pain management: pain level controlled Vital Signs Assessment: post-procedure vital signs reviewed and stable Respiratory status: spontaneous breathing, nonlabored ventilation, respiratory function stable and patient connected to nasal cannula oxygen Cardiovascular status: blood pressure returned to baseline and stable Postop Assessment: no apparent nausea or vomiting Anesthetic complications: no   No notable events documented.  Last Vitals:  Vitals:   07/14/22 1045 07/14/22 1100  BP: 114/66 128/70  Pulse: 68 71  Resp: 12 14  Temp: (!) 36.4 C   SpO2: 95% 97%    Last Pain:  Vitals:   07/14/22 1017  PainSc: Asleep                 Ima Hafner

## 2022-07-17 ENCOUNTER — Telehealth: Payer: Self-pay | Admitting: Emergency Medicine

## 2022-07-17 NOTE — Telephone Encounter (Signed)
PT sister calling about financial relief program for his medication. (Eliquis Generic) Was told by Pharm he may qual for assistance. Pls call [phone]. Sister (Not ayth to speak for PT) will answer but PT will be standing by. TY

## 2022-07-17 NOTE — Telephone Encounter (Signed)
Called and left voicemail for patient to call office back with how he would like the patient assistance paperwork for Eliquis.

## 2022-07-18 ENCOUNTER — Telehealth: Payer: Self-pay | Admitting: Emergency Medicine

## 2022-07-18 NOTE — Telephone Encounter (Signed)
Please set the patient up with an office visit with Dr. Lamonte Sakai next available.  Does not need to be in a blocked or nodule slot.

## 2022-07-18 NOTE — Telephone Encounter (Signed)
Called to review bronchoscopy results with the patient.  No answer but did leave voicemail.  His biopsy results are negative, do not show any evidence of malignancy.  We will need to follow his CT scan of the chest to ensure that his nodule does not progress or change our suspicion.  He needs to follow-up with me to plan next steps.

## 2022-07-18 NOTE — Telephone Encounter (Signed)
Called patient but he did not answer. Left detailed message for him to call us back to get him scheduled for an OV. Will leave encounter open for follow up.

## 2022-07-25 ENCOUNTER — Ambulatory Visit (INDEPENDENT_AMBULATORY_CARE_PROVIDER_SITE_OTHER): Payer: Medicare Other | Admitting: Primary Care

## 2022-07-25 VITALS — BP 110/60 | HR 95 | Ht 67.5 in | Wt 132.0 lb

## 2022-07-25 DIAGNOSIS — R911 Solitary pulmonary nodule: Secondary | ICD-10-CM | POA: Diagnosis not present

## 2022-07-25 DIAGNOSIS — J439 Emphysema, unspecified: Secondary | ICD-10-CM

## 2022-07-25 NOTE — Patient Instructions (Addendum)
-  Lung biopsy was negative for malignant cells  Recommendations: - Strongly encourage you quit smoking (pick quit date and attempt this month) - Restart Spiriva Respimat - take 2 puffs daily in the morning (please fill out form for patient assistance)  Orders: - CT chest wo contrast in 3* months re: RUL lung nodule   Follow-up: - 3 months with Dr. Lamonte Sakai

## 2022-07-25 NOTE — Progress Notes (Signed)
$'@Patient'K$  ID: Peter [name], male    DOB: April 04, 1951, 72 y.o.   MRN: [id number]  Chief Complaint  Patient presents with   Follow-up    Bronch 07/14/2022 Breathing is no change  Coughing at nigh     Referring provider: Sonia Side., FNP  HPI: 72 year old man with history of tobacco use, currently smoking 4-5 cigarettes daily as (30 pack years), iron deficiency anemia, diabetes, cirrhosis, hypertension, COPD, CVA.  He is referred today for an abnormal CT scan of the chest. Patient of Dr. Lamonte Sakai.   PET scan- Very low metabolic activity associated with the RIGHT upper lobe low-density spiculated nodule. Lesion remains indeterminate for low-grade adenocarcinoma.    07/25/2022 Patient underwent bronchoscopy with Dr. Lamonte Sakai on 07/14/22 for right upper lobe nodule. Pathology negative for malignant cells He is still smoking 3 cigarettes  Rare cough. No hemoptysis or weight loss Mild DOE not currently taking Spiriva respimat  Repeat CT chest in 3 months, then return to lung cancer screening    Allergies  Allergen Reactions   Poison Ivy Extract [Poison Ivy Extract] Rash    Immunization History  Administered Date(s) Administered   Influenza Split 06/07/2013   Influenza, Seasonal, Injecte, Preservative Fre 04/02/2018   Influenza,inj,Quad PF,6+ Mos 05/26/2014   Influenza-Unspecified 08/10/2012   Pneumococcal Polysaccharide-23 01/04/2012, 05/26/2014   Tdap 01/02/2019    Past Medical History:  Diagnosis Date   Ambulates with cane    straight cane   Anemia    iron def.   Anxiety    no meds   Bone spur of other site    Left Foot   Cataract    surgery to remove   Chronic pain    Cirrhosis of liver (HCC)    Depression    no meds   Diabetes mellitus without complication ([place])    Patient Denies - No meds   Emphysema of lung (HCC)    ETOH abuse    Glaucoma    Hard of hearing    Hyperlipemia    Hypertension    not on medication   Impaired mobility    uses cane    Pneumonia 08/17/2021   x 1   Sinusitis    Stroke [facility])    weakness left side   Total self care deficit    Denies - Performs ADLs without assistance per patient 03/30/19    Tobacco History: Social History   Tobacco Use  Smoking Status Every Day   Packs/day: 1.00   Years: 30.00   Total pack years: 30.00   Types: Cigarettes  Smokeless Tobacco Never  Tobacco Comments   3-5 cigarettes per day ALS 12/15   Ready to quit: Not Answered Counseling given: Not Answered Tobacco comments: 3-5 cigarettes per day ALS 12/15   Outpatient Medications Prior to Visit  Medication Sig Dispense Refill   apixaban (ELIQUIS) 5 MG TABS tablet Take 1 tablet (5 mg total) by mouth 2 (two) times daily. Okay to restart this medication on 07/15/2022. 60 tablet 3   atorvastatin (LIPITOR) 40 MG tablet Take 40 mg by mouth daily.     ferrous sulfate 325 (65 FE) MG tablet Take 325 mg by mouth daily.     finasteride (PROSCAR) 5 MG tablet Take 5 mg by mouth daily.     mirtazapine (REMERON) 15 MG tablet Take 15 mg by mouth at bedtime.     montelukast (SINGULAIR) 10 MG tablet Take 10 mg by mouth daily.     Multiple Vitamin (  ONE-A-DAY MENS PO) Take 1 tablet by mouth daily.     albuterol (VENTOLIN HFA) 108 (90 Base) MCG/ACT inhaler Inhale 2 puffs into the lungs every 6 (six) hours as needed for wheezing or shortness of breath. (Patient not taking: Reported on 07/25/2022)     tamsulosin (FLOMAX) 0.4 MG CAPS capsule Take 0.4 mg by mouth daily. (Patient not taking: Reported on 07/25/2022)     Tiotropium Bromide Monohydrate (SPIRIVA RESPIMAT) 1.25 MCG/ACT AERS Inhale 2 puffs into the lungs daily. (Patient not taking: Reported on 07/25/2022) 4 g 6   No facility-administered medications prior to visit.    Review of Systems  Review of Systems  Constitutional: Negative.   HENT: Negative.    Respiratory: Negative.    Cardiovascular: Negative.     Physical Exam  BP 110/60 (BP Location: Left Arm, Patient Position: Sitting,  Cuff Size: Normal)   Pulse 95   Ht 5' 7.5" (1.715 m)   Wt 132 lb (59.9 kg)   SpO2 100%   BMI 20.37 kg/m  Physical Exam   Lab Results:  CBC    Component Value Date/Time   WBC 4.9 07/14/2022 0719   RBC 5.06 07/14/2022 0719   HGB 12.1 (L) 07/14/2022 0719   HCT 38.9 (L) 07/14/2022 0719   PLT 282 07/14/2022 0719   MCV 76.9 (L) 07/14/2022 0719   MCH 23.9 (L) 07/14/2022 0719   MCHC 31.1 07/14/2022 0719   RDW 17.1 (H) 07/14/2022 0719   LYMPHSABS 2.3 09/28/2021 0019   MONOABS 0.6 09/28/2021 0019   EOSABS 0.1 09/28/2021 0019   BASOSABS 0.0 09/28/2021 0019    BMET    Component Value Date/Time   NA 138 07/14/2022 0719   K 4.3 07/14/2022 0719   CL 107 07/14/2022 0719   CO2 25 07/14/2022 0719   GLUCOSE 133 (H) 07/14/2022 0719   BUN 12 07/14/2022 0719   CREATININE 1.04 07/14/2022 0719   CREATININE 0.94 12/21/2014 1136   CALCIUM 9.0 07/14/2022 0719   GFRNONAA >60 07/14/2022 0719   GFRNONAA 87 12/21/2014 1136   GFRAA >60 09/07/2019 1941   GFRAA >89 12/21/2014 1136    BNP No results found for: "BNP"  ProBNP    Component Value Date/Time   PROBNP 6,527.0 (H) 05/28/2014 0500    Imaging: DG Chest Port 1 View  Result Date: 07/14/2022 CLINICAL DATA:  Status post endobronchial biopsy of right upper lobe pulmonary nodule EXAM: PORTABLE CHEST 1 VIEW COMPARISON:  CT chest dated 04/01/2022, chest radiograph dated 09/26/2021 FINDINGS: Status post endobronchial biopsy of spiculated right upper lobe pulmonary nodule. Metallic fiducial is present in the right apex. Normal lung volumes. Subtle hazy opacity underlying the fiducial. No pleural effusion or pneumothorax. The heart size and mediastinal contours are within normal limits. The visualized skeletal structures are unremarkable. IMPRESSION: Status post endobronchial biopsy of spiculated right upper lobe pulmonary nodule. Subtle hazy opacity underlying the fiducial may reflect a combination of the spiculated nodule and post biopsy  changes. No pneumothorax. Electronically Signed   By: Darrin Nipper M.D.   On: 07/14/2022 11:12   DG C-ARM BRONCHOSCOPY  Result Date: 07/14/2022 C-ARM BRONCHOSCOPY: Fluoroscopy was utilized by the requesting physician.  No radiographic interpretation.     Assessment & Plan:   Nodule of upper lobe of right lung - Patient has a 2 cm right upper lobe nodule, low metabolic activity on PET scan.  Lesion remains indeterminate for low-grade adenocarcinoma.  Patient underwent bronchoscopy with Dr. Lamonte Sakai, pathology negative for malignant cells. Repeat  CT imaging in 3 months. Consider biodesic testing.      Martyn Ehrich, NP 07/28/2022

## 2022-07-25 NOTE — Assessment & Plan Note (Signed)
-  Patient has a 2 cm right upper lobe nodule, low metabolic activity on PET scan.  Lesion remains indeterminate for low-grade adenocarcinoma.  Patient underwent bronchoscopy with Dr. Lamonte Sakai, pathology negative for malignant cells. Repeat CT imaging in 3 months. Consider biodesic testing.

## 2022-07-28 ENCOUNTER — Telehealth: Payer: Self-pay | Admitting: Primary Care

## 2022-07-28 NOTE — Assessment & Plan Note (Signed)
-  Restart Spiriva respimat, samples given and paper work for [employer]

## 2022-07-28 NOTE — Telephone Encounter (Signed)
PT placed on reminder list to follow up after 3 mth repeat Chest CT with return to lung cancer screening.

## 2022-07-28 NOTE — Telephone Encounter (Signed)
Pathology was negative for malignant cells. Ordered for repeat CT chest in 3 months then will need to re-join lung cancer screening program

## 2022-09-12 NOTE — Telephone Encounter (Signed)
Re routing to triage for one more call attempt.

## 2022-10-24 ENCOUNTER — Ambulatory Visit (HOSPITAL_COMMUNITY): Payer: Medicare Other | Attending: Primary Care

## 2023-01-08 ENCOUNTER — Inpatient Hospital Stay (HOSPITAL_COMMUNITY)
Admission: EM | Admit: 2023-01-08 | Discharge: 2023-01-17 | DRG: 521 | Disposition: A | Discharge status: Home Health | Payer: Medicare Other | Attending: Internal Medicine | Admitting: Internal Medicine

## 2023-01-08 ENCOUNTER — Emergency Department (HOSPITAL_COMMUNITY): Payer: Medicare Other

## 2023-01-08 ENCOUNTER — Encounter (HOSPITAL_COMMUNITY): Payer: Self-pay

## 2023-01-08 ENCOUNTER — Other Ambulatory Visit: Payer: Self-pay

## 2023-01-08 DIAGNOSIS — R5082 Postprocedural fever: Secondary | ICD-10-CM | POA: Diagnosis not present

## 2023-01-08 DIAGNOSIS — E43 Unspecified severe protein-calorie malnutrition: Secondary | ICD-10-CM | POA: Diagnosis present

## 2023-01-08 DIAGNOSIS — Y92002 Bathroom of unspecified non-institutional (private) residence single-family (private) house as the place of occurrence of the external cause: Secondary | ICD-10-CM

## 2023-01-08 DIAGNOSIS — E440 Moderate protein-calorie malnutrition: Secondary | ICD-10-CM | POA: Diagnosis present

## 2023-01-08 DIAGNOSIS — Z681 Body mass index (BMI) 19 or less, adult: Secondary | ICD-10-CM

## 2023-01-08 DIAGNOSIS — E785 Hyperlipidemia, unspecified: Secondary | ICD-10-CM | POA: Diagnosis present

## 2023-01-08 DIAGNOSIS — N400 Enlarged prostate without lower urinary tract symptoms: Secondary | ICD-10-CM | POA: Diagnosis present

## 2023-01-08 DIAGNOSIS — K7460 Unspecified cirrhosis of liver: Secondary | ICD-10-CM | POA: Diagnosis present

## 2023-01-08 DIAGNOSIS — J439 Emphysema, unspecified: Secondary | ICD-10-CM | POA: Diagnosis present

## 2023-01-08 DIAGNOSIS — Z8249 Family history of ischemic heart disease and other diseases of the circulatory system: Secondary | ICD-10-CM | POA: Diagnosis not present

## 2023-01-08 DIAGNOSIS — Z7901 Long term (current) use of anticoagulants: Secondary | ICD-10-CM

## 2023-01-08 DIAGNOSIS — Z79899 Other long term (current) drug therapy: Secondary | ICD-10-CM

## 2023-01-08 DIAGNOSIS — Z8673 Personal history of transient ischemic attack (TIA), and cerebral infarction without residual deficits: Secondary | ICD-10-CM | POA: Diagnosis not present

## 2023-01-08 DIAGNOSIS — S72002A Fracture of unspecified part of neck of left femur, initial encounter for closed fracture: Secondary | ICD-10-CM | POA: Diagnosis not present

## 2023-01-08 DIAGNOSIS — Z833 Family history of diabetes mellitus: Secondary | ICD-10-CM

## 2023-01-08 DIAGNOSIS — J449 Chronic obstructive pulmonary disease, unspecified: Secondary | ICD-10-CM | POA: Diagnosis present

## 2023-01-08 DIAGNOSIS — S72111A Displaced fracture of greater trochanter of right femur, initial encounter for closed fracture: Secondary | ICD-10-CM | POA: Diagnosis not present

## 2023-01-08 DIAGNOSIS — W1839XA Other fall on same level, initial encounter: Secondary | ICD-10-CM | POA: Diagnosis present

## 2023-01-08 DIAGNOSIS — S72012A Unspecified intracapsular fracture of left femur, initial encounter for closed fracture: Principal | ICD-10-CM | POA: Diagnosis present

## 2023-01-08 DIAGNOSIS — F17210 Nicotine dependence, cigarettes, uncomplicated: Secondary | ICD-10-CM | POA: Diagnosis present

## 2023-01-08 DIAGNOSIS — F1020 Alcohol dependence, uncomplicated: Secondary | ICD-10-CM | POA: Diagnosis present

## 2023-01-08 DIAGNOSIS — I5020 Unspecified systolic (congestive) heart failure: Secondary | ICD-10-CM | POA: Diagnosis not present

## 2023-01-08 DIAGNOSIS — I69354 Hemiplegia and hemiparesis following cerebral infarction affecting left non-dominant side: Secondary | ICD-10-CM

## 2023-01-08 DIAGNOSIS — F1010 Alcohol abuse, uncomplicated: Secondary | ICD-10-CM | POA: Diagnosis present

## 2023-01-08 DIAGNOSIS — I10 Essential (primary) hypertension: Secondary | ICD-10-CM | POA: Diagnosis present

## 2023-01-08 DIAGNOSIS — E119 Type 2 diabetes mellitus without complications: Secondary | ICD-10-CM | POA: Diagnosis present

## 2023-01-08 DIAGNOSIS — Z803 Family history of malignant neoplasm of breast: Secondary | ICD-10-CM

## 2023-01-08 DIAGNOSIS — D62 Acute posthemorrhagic anemia: Secondary | ICD-10-CM | POA: Diagnosis not present

## 2023-01-08 DIAGNOSIS — Z1152 Encounter for screening for COVID-19: Secondary | ICD-10-CM | POA: Diagnosis not present

## 2023-01-08 DIAGNOSIS — S72009A Fracture of unspecified part of neck of unspecified femur, initial encounter for closed fracture: Secondary | ICD-10-CM | POA: Diagnosis not present

## 2023-01-08 DIAGNOSIS — K7030 Alcoholic cirrhosis of liver without ascites: Secondary | ICD-10-CM | POA: Diagnosis present

## 2023-01-08 DIAGNOSIS — Z720 Tobacco use: Secondary | ICD-10-CM | POA: Diagnosis present

## 2023-01-08 DIAGNOSIS — M25552 Pain in left hip: Secondary | ICD-10-CM | POA: Diagnosis not present

## 2023-01-08 DIAGNOSIS — I110 Hypertensive heart disease with heart failure: Secondary | ICD-10-CM | POA: Diagnosis not present

## 2023-01-08 LAB — CBC WITH DIFFERENTIAL/PLATELET
Abs Immature Granulocytes: 0.02 10*3/uL (ref 0.00–0.07)
Basophils Absolute: 0.1 10*3/uL (ref 0.0–0.1)
Basophils Relative: 1 %
Eosinophils Absolute: 0.1 10*3/uL (ref 0.0–0.5)
Eosinophils Relative: 2 %
HCT: 37.7 % — ABNORMAL LOW (ref 39.0–52.0)
Hemoglobin: 11.9 g/dL — ABNORMAL LOW (ref 13.0–17.0)
Immature Granulocytes: 0 %
Lymphocytes Relative: 27 %
Lymphs Abs: 1.7 10*3/uL (ref 0.7–4.0)
MCH: 24.3 pg — ABNORMAL LOW (ref 26.0–34.0)
MCHC: 31.6 g/dL (ref 30.0–36.0)
MCV: 76.9 fL — ABNORMAL LOW (ref 80.0–100.0)
Monocytes Absolute: 0.5 10*3/uL (ref 0.1–1.0)
Monocytes Relative: 8 %
Neutro Abs: 3.8 10*3/uL (ref 1.7–7.7)
Neutrophils Relative %: 62 %
Platelets: 389 10*3/uL (ref 150–400)
RBC: 4.9 MIL/uL (ref 4.22–5.81)
RDW: 16.2 % — ABNORMAL HIGH (ref 11.5–15.5)
WBC: 6.1 10*3/uL (ref 4.0–10.5)
nRBC: 0 % (ref 0.0–0.2)

## 2023-01-08 LAB — URINALYSIS, ROUTINE W REFLEX MICROSCOPIC
Bilirubin Urine: NEGATIVE
Glucose, UA: NEGATIVE mg/dL
Hgb urine dipstick: NEGATIVE
Ketones, ur: NEGATIVE mg/dL
Leukocytes,Ua: NEGATIVE
Nitrite: NEGATIVE
Protein, ur: NEGATIVE mg/dL
Specific Gravity, Urine: 1.008 (ref 1.005–1.030)
pH: 6 (ref 5.0–8.0)

## 2023-01-08 LAB — PROTIME-INR
INR: 1 (ref 0.8–1.2)
Prothrombin Time: 13.6 seconds (ref 11.4–15.2)

## 2023-01-08 LAB — BASIC METABOLIC PANEL
Anion gap: 12 (ref 5–15)
BUN: 13 mg/dL (ref 8–23)
CO2: 22 mmol/L (ref 22–32)
Calcium: 8.9 mg/dL (ref 8.9–10.3)
Chloride: 100 mmol/L (ref 98–111)
Creatinine, Ser: 1.1 mg/dL (ref 0.61–1.24)
GFR, Estimated: >60 mL/min (ref >60)
Glucose, Bld: 70 mg/dL (ref 70–99)
Potassium: 4 mmol/L (ref 3.5–5.1)
Sodium: 134 mmol/L — ABNORMAL LOW (ref 135–145)

## 2023-01-08 LAB — TYPE AND SCREEN
ABO/RH(D): O POS
Antibody Screen: NEGATIVE

## 2023-01-08 MED ORDER — OXYCODONE-ACETAMINOPHEN 5-325 MG PO TABS
1.0000 | ORAL_TABLET | ORAL | Status: DC | PRN
  Administered 2023-01-08: 1 via ORAL
  Filled 2023-01-08: qty 1

## 2023-01-08 MED ORDER — DIAZEPAM 5 MG PO TABS
5.0000 mg | ORAL_TABLET | Freq: Two times a day (BID) | ORAL | Status: DC
  Administered 2023-01-08 – 2023-01-17 (×15): 5 mg via ORAL
  Filled 2023-01-08 (×16): qty 1

## 2023-01-08 MED ORDER — SODIUM CHLORIDE 0.9 % IV SOLN: INTRAVENOUS | Status: DC

## 2023-01-08 MED ORDER — ONDANSETRON HCL 4 MG PO TABS: 4.0000 mg | ORAL_TABLET | Freq: Four times a day (QID) | ORAL | Status: DC | PRN

## 2023-01-08 MED ORDER — HYDROMORPHONE HCL 1 MG/ML IJ SOLN
0.5000 mg | INTRAMUSCULAR | Status: DC | PRN
  Administered 2023-01-08 – 2023-01-09 (×4): 0.5 mg via INTRAVENOUS
  Filled 2023-01-08 (×4): qty 0.5

## 2023-01-08 MED ORDER — THIAMINE MONONITRATE 100 MG PO TABS
100.0000 mg | ORAL_TABLET | Freq: Every day | ORAL | Status: DC
  Administered 2023-01-08 – 2023-01-17 (×9): 100 mg via ORAL
  Filled 2023-01-08 (×9): qty 1

## 2023-01-08 MED ORDER — SENNOSIDES-DOCUSATE SODIUM 8.6-50 MG PO TABS
1.0000 | ORAL_TABLET | Freq: Two times a day (BID) | ORAL | Status: DC
  Administered 2023-01-09 – 2023-01-17 (×15): 1 via ORAL
  Filled 2023-01-08 (×16): qty 1

## 2023-01-08 MED ORDER — HYDROMORPHONE HCL 1 MG/ML IJ SOLN
0.5000 mg | Freq: Once | INTRAMUSCULAR | Status: AC
  Administered 2023-01-08: 0.5 mg via INTRAVENOUS
  Filled 2023-01-08: qty 1

## 2023-01-08 MED ORDER — ATORVASTATIN CALCIUM 40 MG PO TABS
40.0000 mg | ORAL_TABLET | Freq: Every day | ORAL | Status: DC
  Administered 2023-01-08 – 2023-01-16 (×9): 40 mg via ORAL
  Filled 2023-01-08 (×9): qty 1

## 2023-01-08 MED ORDER — FENTANYL CITRATE PF 50 MCG/ML IJ SOSY
50.0000 ug | PREFILLED_SYRINGE | Freq: Once | INTRAMUSCULAR | Status: AC
  Administered 2023-01-08: 50 ug via INTRAVENOUS
  Filled 2023-01-08: qty 1

## 2023-01-08 MED ORDER — ONDANSETRON HCL 4 MG/2ML IJ SOLN: 4.0000 mg | Freq: Four times a day (QID) | INTRAMUSCULAR | Status: DC | PRN

## 2023-01-08 MED ORDER — TAMSULOSIN HCL 0.4 MG PO CAPS
0.4000 mg | ORAL_CAPSULE | Freq: Every day | ORAL | Status: DC
  Administered 2023-01-08 – 2023-01-17 (×9): 0.4 mg via ORAL
  Filled 2023-01-08 (×9): qty 1

## 2023-01-08 MED ORDER — MIRTAZAPINE 15 MG PO TABS
15.0000 mg | ORAL_TABLET | Freq: Every day | ORAL | Status: DC
  Administered 2023-01-08 – 2023-01-16 (×8): 15 mg via ORAL
  Filled 2023-01-08 (×9): qty 1

## 2023-01-08 MED ORDER — ACETAMINOPHEN 325 MG PO TABS: 650.0000 mg | ORAL_TABLET | Freq: Four times a day (QID) | ORAL | Status: DC | PRN

## 2023-01-08 MED ORDER — ACETAMINOPHEN 650 MG RE SUPP: 650.0000 mg | Freq: Four times a day (QID) | RECTAL | Status: DC | PRN

## 2023-01-08 MED ORDER — MONTELUKAST SODIUM 10 MG PO TABS
10.0000 mg | ORAL_TABLET | Freq: Every day | ORAL | Status: DC
  Administered 2023-01-08 – 2023-01-16 (×9): 10 mg via ORAL
  Filled 2023-01-08 (×9): qty 1

## 2023-01-08 MED ORDER — FINASTERIDE 5 MG PO TABS
5.0000 mg | ORAL_TABLET | Freq: Every day | ORAL | Status: DC
  Administered 2023-01-08 – 2023-01-17 (×9): 5 mg via ORAL
  Filled 2023-01-08 (×9): qty 1

## 2023-01-08 NOTE — ED Notes (Signed)
ED TO INPATIENT HANDOFF REPORT  ED Nurse Name and Phone #: Leavy Cella [place] [phone]  S Name/Age/Gender Peter [name] 72 y.o. male Room/Bed: 022C/022C  Code Status   Code Status: Prior  Home/SNF/Other Home Patient oriented to: self, place, time, situation Is this baseline? Yes   Triage Complete: Triage complete  Chief Complaint Hip fracture (HCC) [S72.009A]  Triage Note Ems reports patient was walking to the bathroom this morning when he felt a pop in his left hip. Pt has had chronic hip pain but has not been able to ambulate since feeling the pop. No rotation or deformity noted. Unable to note if there's any shortening since patient is unable to straighten leg without significant pain.    Allergies Allergies  Allergen Reactions   Poison Ivy Extract [Poison Ivy Extract] Rash    Level of Care/Admitting Diagnosis ED Disposition     ED Disposition  Admit   Condition  --   Comment  Hospital Area: [facility] [[id number]]  Level of Care: Med-Surg [16]  May admit patient to [facility] or [facility] if equivalent level of care is available:: No  Covid Evaluation: Asymptomatic - no recent exposure (last 10 days) testing not required  Diagnosis: Hip fracture [facility]) [119147]  Admitting Physician: Zannie Cove [[id number]]  Attending Physician: Zannie Cove [[id number]]  Certification:: I certify this patient will need inpatient services for at least 2 midnights  Estimated Length of Stay: 2          B Medical/Surgery History Past Medical History:  Diagnosis Date   Ambulates with cane    straight cane   Anemia    iron def.   Anxiety    no meds   Bone spur of other site    Left Foot   Cataract    surgery to remove   Chronic pain    Cirrhosis of liver (HCC)    Depression    no meds   Diabetes mellitus without complication (HCC)    Patient Denies - No meds   Emphysema of lung (HCC)    ETOH abuse    Glaucoma    Hard of hearing    Hyperlipemia     Hypertension    not on medication   Impaired mobility    uses cane   Pneumonia 08/17/2021   x 1   Sinusitis    Stroke [facility])    weakness left side   Total self care deficit    Denies - Performs ADLs without assistance per patient 03/30/19   Past Surgical History:  Procedure Laterality Date   bone spur  2013   left spur   BRONCHIAL BIOPSY  07/14/2022   Procedure: BRONCHIAL BIOPSIES;  Surgeon: Leslye Peer, MD;  Location: [facility] ENDOSCOPY;  Service: Pulmonary;;   BRONCHIAL BRUSHINGS  07/14/2022   Procedure: BRONCHIAL BRUSHINGS;  Surgeon: Leslye Peer, MD;  Location: [facility] ENDOSCOPY;  Service: Pulmonary;;   BRONCHIAL NEEDLE ASPIRATION BIOPSY  07/14/2022   Procedure: BRONCHIAL NEEDLE ASPIRATION BIOPSIES;  Surgeon: Leslye Peer, MD;  Location: [facility] ENDOSCOPY;  Service: Pulmonary;;   BRONCHIAL WASHINGS  07/14/2022   Procedure: BRONCHIAL WASHINGS;  Surgeon: Leslye Peer, MD;  Location: [facility] ENDOSCOPY;  Service: Pulmonary;;   CATARACT EXTRACTION W/ INTRAOCULAR LENS IMPLANT Bilateral    COLONOSCOPY     ESOPHAGOGASTRODUODENOSCOPY  01/09/2012   Procedure: ESOPHAGOGASTRODUODENOSCOPY (EGD);  Surgeon: Theda Belfast, MD;  Location: [facility] ENDOSCOPY;  Service: Endoscopy;  Laterality: N/A;   FIDUCIAL MARKER PLACEMENT  07/14/2022   Procedure: FIDUCIAL MARKER PLACEMENT;  Surgeon: Leslye Peer, MD;  Location: [facility] ENDOSCOPY;  Service: Pulmonary;;   IM NAILING TIBIA Right 04/16/2017   MOUTH SURGERY     MULTIPLE EXTRACTIONS WITH ALVEOLOPLASTY  03/29/2012   Procedure: MULTIPLE EXTRACION WITH ALVEOLOPLASTY;  Surgeon: Georgia Lopes, DDS;  Location: [facility] OR;  Service: Oral Surgery;  Laterality: Bilateral;   ORIF ELBOW FRACTURE Right 01/03/2019   Procedure: OPEN REDUCTION INTERNAL FIXATION (ORIF) ELBOW/OLECRANON FRACTURE;  Surgeon: Ernest Mallick, MD;  Location: [facility] OR;  Service: Orthopedics;  Laterality: Right;   ORIF ELBOW FRACTURE Right 03/31/2019   Procedure: right olecranon nonunion revision open reduction,  internal fixation and surgery as indicated;  Surgeon: Ernest Mallick, MD;  Location: [facility] OR;  Service: Orthopedics;  Laterality: Right;    TIBIA IM NAIL INSERTION Right 04/16/2017   Procedure: INTRAMEDULLARY (IM) NAIL TIBIAL;  Surgeon: Tarry Kos, MD;  Location: [facility] OR;  Service: Orthopedics;  Laterality: Right;   VIDEO BRONCHOSCOPY WITH RADIAL ENDOBRONCHIAL ULTRASOUND  07/14/2022   Procedure: VIDEO BRONCHOSCOPY WITH RADIAL ENDOBRONCHIAL ULTRASOUND;  Surgeon: Leslye Peer, MD;  Location: [facility] ENDOSCOPY;  Service: Pulmonary;;     A IV Location/Drains/Wounds Patient Lines/Drains/Airways Status     Active Line/Drains/Airways     Name Placement date Placement time Site Days   Peripheral IV 01/08/23 20 G Distal;Posterior;Right Wrist 01/08/23  0734  Wrist  less than 1            Intake/Output Last 24 hours No intake or output data in the 24 hours ending 01/08/23 1259  Labs/Imaging Results for orders placed or performed during the hospital encounter of 01/08/23 (from the past 48 hour(s))  Type and screen [facility]     Status: None   Collection Time: 01/08/23  9:30 AM  Result Value Ref Range   ABO/RH(D) O POS    Antibody Screen NEG    Sample Expiration      01/11/2023,2359 Performed at [facility], [address], [place], [place] [address]   Basic metabolic panel     Status: Abnormal   Collection Time: 01/08/23  9:42 AM  Result Value Ref Range   Sodium 134 (L) 135 - 145 mmol/L   Potassium 4.0 3.5 - 5.1 mmol/L   Chloride 100 98 - 111 mmol/L   CO2 22 22 - 32 mmol/L   Glucose, Bld 70 70 - 99 mg/dL    Comment: Glucose reference range applies only to samples taken after fasting for at least 8 hours.   BUN 13 8 - 23 mg/dL   Creatinine, Ser 1.91 0.61 - 1.24 mg/dL   Calcium 8.9 8.9 - 47.8 mg/dL   GFR, Estimated >29 >56 mL/min    Comment: (NOTE) Calculated using the CKD-EPI Creatinine Equation (2021)    Anion gap 12 5 - 15    Comment:  Performed at [facility], [address] [place], [place] [address]  CBC with Differential     Status: Abnormal   Collection Time: 01/08/23  9:42 AM  Result Value Ref Range   WBC 6.1 4.0 - 10.5 K/uL   RBC 4.90 4.22 - 5.81 MIL/uL   Hemoglobin 11.9 (L) 13.0 - 17.0 g/dL   HCT 65.7 (L) 84.6 - 96.2 %   MCV 76.9 (L) 80.0 - 100.0 fL   MCH 24.3 (L) 26.0 - 34.0 pg   MCHC 31.6 30.0 - 36.0 g/dL   RDW 95.2 (  H) 11.5 - 15.5 %   Platelets 389 150 - 400 K/uL   nRBC 0.0 0.0 - 0.2 %   Neutrophils Relative % 62 %   Neutro Abs 3.8 1.7 - 7.7 K/uL   Lymphocytes Relative 27 %   Lymphs Abs 1.7 0.7 - 4.0 K/uL   Monocytes Relative 8 %   Monocytes Absolute 0.5 0.1 - 1.0 K/uL   Eosinophils Relative 2 %   Eosinophils Absolute 0.1 0.0 - 0.5 K/uL   Basophils Relative 1 %   Basophils Absolute 0.1 0.0 - 0.1 K/uL   Immature Granulocytes 0 %   Abs Immature Granulocytes 0.02 0.00 - 0.07 K/uL    Comment: Performed at [facility] Lab, [address], [place], [place] [address]  Protime-INR     Status: None   Collection Time: 01/08/23  9:42 AM  Result Value Ref Range   Prothrombin Time 13.6 11.4 - 15.2 seconds   INR 1.0 0.8 - 1.2    Comment: (NOTE) INR goal varies based on device and disease states. Performed at [facility], [address], [place], [place] [address]   Urinalysis, Routine w reflex microscopic -Urine, Clean Catch     Status: None   Collection Time: 01/08/23 10:40 AM  Result Value Ref Range   Color, Urine YELLOW YELLOW   APPearance CLEAR CLEAR   Specific Gravity, Urine 1.008 1.005 - 1.030   pH 6.0 5.0 - 8.0   Glucose, UA NEGATIVE NEGATIVE mg/dL   Hgb urine dipstick NEGATIVE NEGATIVE   Bilirubin Urine NEGATIVE NEGATIVE   Ketones, ur NEGATIVE NEGATIVE mg/dL   Protein, ur NEGATIVE NEGATIVE mg/dL   Nitrite NEGATIVE NEGATIVE   Leukocytes,Ua NEGATIVE NEGATIVE    Comment: Performed at [facility], [address] [place], [place] [address]   DG Hip Unilat With Pelvis  2-3 Views Left  Result Date: 01/08/2023 CLINICAL DATA:  Status post fall, left hip pain EXAM: DG HIP (WITH OR WITHOUT PELVIS) 2-3V LEFT COMPARISON:  None Available. FINDINGS: Generalized osteopenia. Acute mildly displaced left subcapital femoral neck fracture. No aggressive osseous lesion. Normal alignment. Ankylosis of bilateral SI joints. Soft tissue are unremarkable. No radiopaque foreign body or soft tissue emphysema. Peripheral vascular atherosclerotic disease. IMPRESSION: 1. Acute mildly displaced left subcapital femoral neck fracture. Electronically Signed   By: Elige Ko M.D.   On: 01/08/2023 08:57    Pending Labs Unresulted Labs (From admission, onward)    None       Vitals/Pain Today's Vitals   01/08/23 1100 01/08/23 1120 01/08/23 1200 01/08/23 1220  BP: 113/76  118/69   Pulse: 72  75   Resp: 17  14   Temp:      TempSrc:      SpO2: 100%  100%   Weight:      Height:      PainSc:  Asleep  0-No pain    Isolation Precautions No active isolations  Medications Medications  thiamine (VITAMIN B1) tablet 100 mg (has no administration in time range)  diazepam (VALIUM) tablet 5 mg (has no administration in time range)  fentaNYL (SUBLIMAZE) injection 50 mcg (50 mcg Intravenous Given 01/08/23 0735)  HYDROmorphone (DILAUDID) injection 0.5 mg (0.5 mg Intravenous Given 01/08/23 0816)  HYDROmorphone (DILAUDID) injection 0.5 mg (0.5 mg Intravenous Given 01/08/23 9147)    Mobility walks with device     Focused Assessments Neuro Assessment Handoff:  Swallow screen pass? Yes          Neuro  Assessment: Within Defined Limits Neuro Checks:      Has TPA been given? No If patient is a Neuro Trauma and patient is going to OR before floor call report to 4N Charge nurse: [phone] or [phone]   R Recommendations: See Admitting Provider Note  Report given to:   Additional Notes: see admission orders

## 2023-01-08 NOTE — H&P (Addendum)
History and Physical    Peter [name] ZOX:[id number] DOB: [birth year] DOA: 01/08/2023  Referring MD/NP/PA: EDP PCP:  Patient coming from: Home  Chief Complaint: Fall/hip pain  HPI: Peter [name] is a 72 y.o. male with PMH significant for alcohol abuse, chronic smoker, COPD, DM 2, HTN, HLD, CVA with left-sided weakness, liver cirrhosis, chronic anemia, anxiety/depression, chronic pain, impaired mobility presented to the ED with left hip pain, patient reports falling last weekend on his left hip noticed some discomfort but was able to get around, this morning when he got up to use the bathroom he felt something pop in his left hip, this was followed by increased pain and inability to ambulate, presented to the ED, vital signs stable, x-ray noted left subcapital femur neck fracture, labs noted sodium 134, creatinine 1.1, hemoglobin 12.9. -EDP reports that patient is on anticoagulation, however I do not see records to confirm this, patient is unclear of of home meds  Review of Systems: As per HPI otherwise 14 point review of systems negative.   Past Medical History:  Diagnosis Date   Ambulates with cane    straight cane   Anemia    iron def.   Anxiety    no meds   Bone spur of other site    Left Foot   Cataract    surgery to remove   Chronic pain    Cirrhosis of liver (HCC)    Depression    no meds   Diabetes mellitus without complication (HCC)    Patient Denies - No meds   Emphysema of lung (HCC)    ETOH abuse    Glaucoma    Hard of hearing    Hyperlipemia    Hypertension    not on medication   Impaired mobility    uses cane   Pneumonia 08/17/2021   x 1   Sinusitis    Stroke [facility])    weakness left side   Total self care deficit    Denies - Performs ADLs without assistance per patient 03/30/19    Past Surgical History:  Procedure Laterality Date   bone spur  2013   left spur   BRONCHIAL BIOPSY  07/14/2022   Procedure: BRONCHIAL BIOPSIES;  Surgeon: Leslye Peer, MD;   Location: [facility] ENDOSCOPY;  Service: Pulmonary;;   BRONCHIAL BRUSHINGS  07/14/2022   Procedure: BRONCHIAL BRUSHINGS;  Surgeon: Leslye Peer, MD;  Location: [facility] ENDOSCOPY;  Service: Pulmonary;;   BRONCHIAL NEEDLE ASPIRATION BIOPSY  07/14/2022   Procedure: BRONCHIAL NEEDLE ASPIRATION BIOPSIES;  Surgeon: Leslye Peer, MD;  Location: [facility] ENDOSCOPY;  Service: Pulmonary;;   BRONCHIAL WASHINGS  07/14/2022   Procedure: BRONCHIAL WASHINGS;  Surgeon: Leslye Peer, MD;  Location: [facility] ENDOSCOPY;  Service: Pulmonary;;   CATARACT EXTRACTION W/ INTRAOCULAR LENS IMPLANT Bilateral    COLONOSCOPY     ESOPHAGOGASTRODUODENOSCOPY  01/09/2012   Procedure: ESOPHAGOGASTRODUODENOSCOPY (EGD);  Surgeon: Theda Belfast, MD;  Location: [facility] ENDOSCOPY;  Service: Endoscopy;  Laterality: N/A;   FIDUCIAL MARKER PLACEMENT  07/14/2022   Procedure: FIDUCIAL MARKER PLACEMENT;  Surgeon: Leslye Peer, MD;  Location: [facility] ENDOSCOPY;  Service: Pulmonary;;   IM NAILING TIBIA Right 04/16/2017   MOUTH SURGERY     MULTIPLE EXTRACTIONS WITH ALVEOLOPLASTY  03/29/2012   Procedure: MULTIPLE EXTRACION WITH ALVEOLOPLASTY;  Surgeon: Georgia Lopes, DDS;  Location: [facility] OR;  Service: Oral Surgery;  Laterality: Bilateral;   ORIF ELBOW FRACTURE Right 01/03/2019   Procedure: OPEN REDUCTION INTERNAL FIXATION (ORIF)  ELBOW/OLECRANON FRACTURE;  Surgeon: Ernest Mallick, MD;  Location: [facility] OR;  Service: Orthopedics;  Laterality: Right;   ORIF ELBOW FRACTURE Right 03/31/2019   Procedure: right olecranon nonunion revision open reduction, internal fixation and surgery as indicated;  Surgeon: Ernest Mallick, MD;  Location: [facility] OR;  Service: Orthopedics;  Laterality: Right;    TIBIA IM NAIL INSERTION Right 04/16/2017   Procedure: INTRAMEDULLARY (IM) NAIL TIBIAL;  Surgeon: Tarry Kos, MD;  Location: [facility] OR;  Service: Orthopedics;  Laterality: Right;   VIDEO BRONCHOSCOPY WITH RADIAL ENDOBRONCHIAL ULTRASOUND  07/14/2022   Procedure: VIDEO BRONCHOSCOPY  WITH RADIAL ENDOBRONCHIAL ULTRASOUND;  Surgeon: Leslye Peer, MD;  Location: [facility] ENDOSCOPY;  Service: Pulmonary;;     reports that he has been smoking cigarettes. He has a 30.00 pack-year smoking history. He has never used smokeless tobacco. He reports current alcohol use of about 7.0 standard drinks of alcohol per week. He reports current drug use. Drug: Cocaine.  Allergies  Allergen Reactions   Poison Ivy Extract [Poison Ivy Extract] Rash    Family History  Problem Relation Age of Onset   Breast cancer Mother    Diabetes Maternal Aunt    Heart disease Sister    Heart disease Maternal Aunt      Prior to Admission medications   Medication Sig Start Date End Date Taking? Authorizing Provider  atorvastatin (LIPITOR) 40 MG tablet Take 40 mg by mouth at bedtime. 09/23/21  Yes [provider]  ferrous sulfate 325 (65 FE) MG tablet Take 325 mg by mouth daily.   Yes [provider]  finasteride (PROSCAR) 5 MG tablet Take 5 mg by mouth daily. 06/18/22  Yes [provider]  mirtazapine (REMERON) 15 MG tablet Take 15 mg by mouth at bedtime. 09/23/21  Yes [provider]  montelukast (SINGULAIR) 10 MG tablet Take 10 mg by mouth at bedtime. 09/23/21  Yes [provider]  tamsulosin (FLOMAX) 0.4 MG CAPS capsule Take 0.4 mg by mouth daily. 09/23/21  Yes [provider]  traMADol (ULTRAM) 50 MG tablet Take 50 mg by mouth every 8 (eight) hours as needed for severe pain or moderate pain.   Yes [provider]    Physical Exam: Vitals:   01/08/23 0945 01/08/23 1000 01/08/23 1029 01/08/23 1100  BP: 119/77 120/74 120/74 113/76  Pulse: 77 78 70 72  Resp:  17 14 17   Temp:   98.4 F (36.9 C)   TempSrc:   Oral   SpO2: 100% 100% 100% 100%  Weight:      Height:          Constitutional: Chronically ill unkempt male appears older than stated age, dysarthric, oriented to self and place, some cognitive deficits noted HEENT: No JVD CVS:  S1-S2, regular rhythm Lungs: Clear bilaterally Abdomen: Soft, nontender, bowel sounds present Extremities: Left leg is short, no edema Neurologic: CN 2-12 grossly intact. Sensation intact, DTR normal. Strength 5/5 in all 4.  Psychiatric: Normal judgment and insight. Alert and oriented x 3. Normal mood.   Labs on Admission: I have personally reviewed following labs and imaging studies  CBC: Recent Labs  Lab 01/08/23 0942  WBC 6.1  NEUTROABS 3.8  HGB 11.9*  HCT 37.7*  MCV 76.9*  PLT 389   Basic Metabolic Panel: Recent Labs  Lab 01/08/23 0942  NA 134*  K 4.0  CL 100  CO2 22  GLUCOSE 70  BUN 13  CREATININE 1.10  CALCIUM 8.9  GFR: Estimated Creatinine Clearance: 51.3 mL/min (by C-G formula based on SCr of 1.1 mg/dL). Liver Function Tests: No results for input(s): "AST", "ALT", "ALKPHOS", "BILITOT", "PROT", "ALBUMIN" in the last 168 hours. No results for input(s): "LIPASE", "AMYLASE" in the last 168 hours. No results for input(s): "AMMONIA" in the last 168 hours. Coagulation Profile: Recent Labs  Lab 01/08/23 0942  INR 1.0   Cardiac Enzymes: No results for input(s): "CKTOTAL", "CKMB", "CKMBINDEX", "TROPONINI" in the last 168 hours. BNP (last 3 results) No results for input(s): "PROBNP" in the last 8760 hours. HbA1C: No results for input(s): "HGBA1C" in the last 72 hours. CBG: No results for input(s): "GLUCAP" in the last 168 hours. Lipid Profile: No results for input(s): "CHOL", "HDL", "LDLCALC", "TRIG", "CHOLHDL", "LDLDIRECT" in the last 72 hours. Thyroid Function Tests: No results for input(s): "TSH", "T4TOTAL", "FREET4", "T3FREE", "THYROIDAB" in the last 72 hours. Anemia Panel: No results for input(s): "VITAMINB12", "FOLATE", "FERRITIN", "TIBC", "IRON", "RETICCTPCT" in the last 72 hours. Urine analysis:    Component Value Date/Time   COLORURINE YELLOW 01/08/2023 1040   APPEARANCEUR CLEAR 01/08/2023 1040   LABSPEC 1.008 01/08/2023 1040   PHURINE 6.0  01/08/2023 1040   GLUCOSEU NEGATIVE 01/08/2023 1040   HGBUR NEGATIVE 01/08/2023 1040   BILIRUBINUR NEGATIVE 01/08/2023 1040   KETONESUR NEGATIVE 01/08/2023 1040   PROTEINUR NEGATIVE 01/08/2023 1040   UROBILINOGEN 0.2 01/05/2015 1806   NITRITE NEGATIVE 01/08/2023 1040   LEUKOCYTESUR NEGATIVE 01/08/2023 1040   Sepsis Labs: @LABRCNTIP (procalcitonin:4,lacticidven:4) )No results found for this or any previous visit (from the past 240 hour(s)).   Radiological Exams on Admission: DG Hip Unilat With Pelvis 2-3 Views Left  Result Date: 01/08/2023 CLINICAL DATA:  Status post fall, left hip pain EXAM: DG HIP (WITH OR WITHOUT PELVIS) 2-3V LEFT COMPARISON:  None Available. FINDINGS: Generalized osteopenia. Acute mildly displaced left subcapital femoral neck fracture. No aggressive osseous lesion. Normal alignment. Ankylosis of bilateral SI joints. Soft tissue are unremarkable. No radiopaque foreign body or soft tissue emphysema. Peripheral vascular atherosclerotic disease. IMPRESSION: 1. Acute mildly displaced left subcapital femoral neck fracture. Electronically Signed   By: Elige Ko M.D.   On: 01/08/2023 08:57    EKG: Independently reviewed.  Sinus rhythm, right axis deviation, no acute ST-T wave changes  Assessment/Plan     Hip fracture, left -Following mechanical fall last week -Orthopedics consulted, per EDP no plan for OR today  Alcohol abuse -At risk of withdrawal, add thiamine, low-dose Valium, monitor for withdrawal -Counseled  Alcoholic liver cirrhosis -Clinically appears euvolemic, not on diuretics at baseline, monitor, counseled  COPD (chronic obstructive pulmonary disease) (HCC) Tobacco abuse -Counseled, DuoNebs as needed  History of CVA (cerebrovascular accident) -Continue statin, add ASA post op  History of BPH -Continue Flomax and Proscar   DVT prophylaxis: SCDs Code Status: Full code Family Communication: Discussed patient detail, no family at  bedside Disposition Plan: May need short-term rehab Consults called: Orthopedics Dr. Aundria Rud called by EDP Admission status: Inpatient  Zannie Cove MD [facility]   01/08/2023, 12:36 PM
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
## 2023-01-08 NOTE — ED Triage Notes (Signed)
Ems reports patient was walking to the bathroom this morning when he felt a pop in his left hip. Pt has had chronic hip pain but has not been able to ambulate since feeling the pop. No rotation or deformity noted. Unable to note if there's any shortening since patient is unable to straighten leg without significant pain.
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
## 2023-01-09 ENCOUNTER — Inpatient Hospital Stay (HOSPITAL_COMMUNITY): Payer: Medicare Other | Admitting: Anesthesiology

## 2023-01-09 ENCOUNTER — Encounter (HOSPITAL_COMMUNITY): Payer: Self-pay | Admitting: Internal Medicine

## 2023-01-09 ENCOUNTER — Other Ambulatory Visit: Payer: Self-pay

## 2023-01-09 ENCOUNTER — Inpatient Hospital Stay (HOSPITAL_COMMUNITY): Payer: Medicare Other

## 2023-01-09 ENCOUNTER — Ambulatory Visit (HOSPITAL_COMMUNITY): Admission: RE | Admit: 2023-01-09 | Payer: Medicare Other | Source: Ambulatory Visit

## 2023-01-09 ENCOUNTER — Encounter (HOSPITAL_COMMUNITY)
Admission: EM | Disposition: A | Discharge status: Home Health | Payer: Self-pay | Source: Home / Self Care | Attending: Internal Medicine

## 2023-01-09 DIAGNOSIS — S72111A Displaced fracture of greater trochanter of right femur, initial encounter for closed fracture: Secondary | ICD-10-CM | POA: Diagnosis not present

## 2023-01-09 DIAGNOSIS — I5020 Unspecified systolic (congestive) heart failure: Secondary | ICD-10-CM

## 2023-01-09 DIAGNOSIS — S72012A Unspecified intracapsular fracture of left femur, initial encounter for closed fracture: Secondary | ICD-10-CM

## 2023-01-09 DIAGNOSIS — I110 Hypertensive heart disease with heart failure: Secondary | ICD-10-CM | POA: Diagnosis not present

## 2023-01-09 DIAGNOSIS — Z8673 Personal history of transient ischemic attack (TIA), and cerebral infarction without residual deficits: Secondary | ICD-10-CM | POA: Diagnosis not present

## 2023-01-09 DIAGNOSIS — K7030 Alcoholic cirrhosis of liver without ascites: Secondary | ICD-10-CM

## 2023-01-09 DIAGNOSIS — J439 Emphysema, unspecified: Secondary | ICD-10-CM | POA: Diagnosis not present

## 2023-01-09 DIAGNOSIS — F17210 Nicotine dependence, cigarettes, uncomplicated: Secondary | ICD-10-CM | POA: Diagnosis not present

## 2023-01-09 HISTORY — PX: TOTAL HIP ARTHROPLASTY: SHX124

## 2023-01-09 LAB — CBC
HCT: 37.3 % — ABNORMAL LOW (ref 39.0–52.0)
HCT: 34.1 % — ABNORMAL LOW (ref 39.0–52.0)
Hemoglobin: 11.9 g/dL — ABNORMAL LOW (ref 13.0–17.0)
Hemoglobin: 10.9 g/dL — ABNORMAL LOW (ref 13.0–17.0)
MCH: 23.8 pg — ABNORMAL LOW (ref 26.0–34.0)
MCH: 23.9 pg — ABNORMAL LOW (ref 26.0–34.0)
MCHC: 31.9 g/dL (ref 30.0–36.0)
MCHC: 32 g/dL (ref 30.0–36.0)
MCV: 74.5 fL — ABNORMAL LOW (ref 80.0–100.0)
MCV: 74.8 fL — ABNORMAL LOW (ref 80.0–100.0)
Platelets: 366 10*3/uL (ref 150–400)
Platelets: 345 10*3/uL (ref 150–400)
RBC: 5.01 MIL/uL (ref 4.22–5.81)
RBC: 4.56 MIL/uL (ref 4.22–5.81)
RDW: 16.2 % — ABNORMAL HIGH (ref 11.5–15.5)
RDW: 16 % — ABNORMAL HIGH (ref 11.5–15.5)
WBC: 4.9 10*3/uL (ref 4.0–10.5)
WBC: 7.1 10*3/uL (ref 4.0–10.5)
nRBC: 0 % (ref 0.0–0.2)
nRBC: 0 % (ref 0.0–0.2)

## 2023-01-09 LAB — COMPREHENSIVE METABOLIC PANEL
ALT: 12 U/L (ref 0–44)
AST: 16 U/L (ref 15–41)
Albumin: 3.5 g/dL (ref 3.5–5.0)
Alkaline Phosphatase: 72 U/L (ref 38–126)
Anion gap: 10 (ref 5–15)
BUN: 15 mg/dL (ref 8–23)
CO2: 25 mmol/L (ref 22–32)
Calcium: 9.2 mg/dL (ref 8.9–10.3)
Chloride: 101 mmol/L (ref 98–111)
Creatinine, Ser: 1.22 mg/dL (ref 0.61–1.24)
GFR, Estimated: >60 mL/min (ref >60)
Glucose, Bld: 110 mg/dL — ABNORMAL HIGH (ref 70–99)
Potassium: 4.5 mmol/L (ref 3.5–5.1)
Sodium: 136 mmol/L (ref 135–145)
Total Bilirubin: 0.6 mg/dL (ref 0.3–1.2)
Total Protein: 6.8 g/dL (ref 6.5–8.1)

## 2023-01-09 LAB — HEMOGLOBIN A1C
Hgb A1c MFr Bld: 5.9 % — ABNORMAL HIGH (ref 4.8–5.6)
Mean Plasma Glucose: 122.63 mg/dL

## 2023-01-09 LAB — GLUCOSE, CAPILLARY
Glucose-Capillary: 112 mg/dL — ABNORMAL HIGH (ref 70–99)
Glucose-Capillary: 143 mg/dL — ABNORMAL HIGH (ref 70–99)
Glucose-Capillary: 134 mg/dL — ABNORMAL HIGH (ref 70–99)
Glucose-Capillary: 177 mg/dL — ABNORMAL HIGH (ref 70–99)

## 2023-01-09 LAB — PROTIME-INR
INR: 1.1 (ref 0.8–1.2)
Prothrombin Time: 13.9 seconds (ref 11.4–15.2)

## 2023-01-09 LAB — SURGICAL PCR SCREEN
MRSA, PCR: NEGATIVE
Staphylococcus aureus: NEGATIVE

## 2023-01-09 LAB — CREATININE, SERUM
Creatinine, Ser: 1.27 mg/dL — ABNORMAL HIGH (ref 0.61–1.24)
GFR, Estimated: >60 mL/min (ref >60)

## 2023-01-09 LAB — HIV ANTIBODY (ROUTINE TESTING W REFLEX): HIV Screen 4th Generation wRfx: NONREACTIVE

## 2023-01-09 SURGERY — ARTHROPLASTY, HIP, TOTAL, ANTERIOR APPROACH
Anesthesia: General | Site: Hip | Laterality: Left

## 2023-01-09 MED ORDER — PRONTOSAN WOUND IRRIGATION OPTIME
TOPICAL | Status: DC | PRN
  Administered 2023-01-09: 450 mL via TOPICAL

## 2023-01-09 MED ORDER — ONDANSETRON HCL 4 MG/2ML IJ SOLN
INTRAMUSCULAR | Status: DC | PRN
  Administered 2023-01-09: 4 mg via INTRAVENOUS

## 2023-01-09 MED ORDER — LACTATED RINGERS IV SOLN: INTRAVENOUS | Status: DC

## 2023-01-09 MED ORDER — ROCURONIUM BROMIDE 10 MG/ML (PF) SYRINGE
PREFILLED_SYRINGE | INTRAVENOUS | Status: DC | PRN
  Administered 2023-01-09: 60 mg via INTRAVENOUS

## 2023-01-09 MED ORDER — VANCOMYCIN HCL 1000 MG IV SOLR
INTRAVENOUS | Status: AC
  Filled 2023-01-09: qty 20

## 2023-01-09 MED ORDER — ACETAMINOPHEN 325 MG PO TABS: 325.0000 mg | ORAL_TABLET | Freq: Four times a day (QID) | ORAL | Status: DC | PRN

## 2023-01-09 MED ORDER — ENOXAPARIN SODIUM 40 MG/0.4ML IJ SOSY: 40.0000 mg | PREFILLED_SYRINGE | Freq: Every day | INTRAMUSCULAR | 0 refills | Status: DC

## 2023-01-09 MED ORDER — FENTANYL CITRATE (PF) 100 MCG/2ML IJ SOLN
INTRAMUSCULAR | Status: AC
  Filled 2023-01-09: qty 2

## 2023-01-09 MED ORDER — EPHEDRINE SULFATE-NACL 50-0.9 MG/10ML-% IV SOSY
PREFILLED_SYRINGE | INTRAVENOUS | Status: DC | PRN
  Administered 2023-01-09: 15 mg via INTRAVENOUS
  Administered 2023-01-09: 10 mg via INTRAVENOUS

## 2023-01-09 MED ORDER — ENOXAPARIN SODIUM 40 MG/0.4ML IJ SOSY
40.0000 mg | PREFILLED_SYRINGE | INTRAMUSCULAR | Status: DC
  Administered 2023-01-10 – 2023-01-17 (×8): 40 mg via SUBCUTANEOUS
  Filled 2023-01-09 (×8): qty 0.4

## 2023-01-09 MED ORDER — CEFAZOLIN SODIUM-DEXTROSE 2-4 GM/100ML-% IV SOLN
2.0000 g | INTRAVENOUS | Status: AC
  Administered 2023-01-09: 2 g via INTRAVENOUS
  Filled 2023-01-09: qty 100

## 2023-01-09 MED ORDER — ALUM & MAG HYDROXIDE-SIMETH 200-200-20 MG/5ML PO SUSP: 30.0000 mL | ORAL | Status: DC | PRN

## 2023-01-09 MED ORDER — ONDANSETRON HCL 4 MG/2ML IJ SOLN
INTRAMUSCULAR | Status: AC
  Filled 2023-01-09: qty 2

## 2023-01-09 MED ORDER — HYDROMORPHONE HCL 1 MG/ML IJ SOLN
0.5000 mg | INTRAMUSCULAR | Status: DC | PRN
  Administered 2023-01-10: 1 mg via INTRAVENOUS
  Filled 2023-01-09: qty 1

## 2023-01-09 MED ORDER — OXYCODONE HCL 5 MG PO TABS
5.00 mg | ORAL_TABLET | Freq: Once | ORAL | Status: DC | PRN
Start: 2023-01-09 — End: 2023-01-09

## 2023-01-09 MED ORDER — ROCURONIUM BROMIDE 10 MG/ML (PF) SYRINGE
PREFILLED_SYRINGE | INTRAVENOUS | Status: AC
  Filled 2023-01-09: qty 10

## 2023-01-09 MED ORDER — OXYCODONE HCL 5 MG PO TABS: 5.0000 mg | ORAL_TABLET | Freq: Three times a day (TID) | ORAL | 0 refills | Status: DC | PRN

## 2023-01-09 MED ORDER — FENTANYL CITRATE (PF) 250 MCG/5ML IJ SOLN
INTRAMUSCULAR | Status: AC
  Filled 2023-01-09: qty 5

## 2023-01-09 MED ORDER — OXYCODONE HCL 5 MG/5ML PO SOLN
5.0000 mg | Freq: Once | ORAL | Status: DC | PRN
Start: 2023-01-09 — End: 2023-01-09

## 2023-01-09 MED ORDER — CHLORHEXIDINE GLUCONATE 0.12 % MT SOLN: 15.0000 mL | Freq: Once | OROMUCOSAL | Status: AC

## 2023-01-09 MED ORDER — EPHEDRINE 5 MG/ML INJ
INTRAVENOUS | Status: AC
  Filled 2023-01-09: qty 5

## 2023-01-09 MED ORDER — ONDANSETRON HCL 4 MG/2ML IJ SOLN: 4.0000 mg | Freq: Once | INTRAMUSCULAR | Status: DC | PRN

## 2023-01-09 MED ORDER — OXYCODONE HCL 5 MG PO TABS
10.0000 mg | ORAL_TABLET | ORAL | Status: DC | PRN
  Administered 2023-01-10 – 2023-01-11 (×3): 15 mg via ORAL
  Administered 2023-01-17: 10 mg via ORAL
  Filled 2023-01-09 (×2): qty 2
  Filled 2023-01-09 (×3): qty 3
  Filled 2023-01-09: qty 2

## 2023-01-09 MED ORDER — TRANEXAMIC ACID-NACL 1000-0.7 MG/100ML-% IV SOLN
1000.0000 mg | INTRAVENOUS | Status: AC
  Administered 2023-01-09: 1000 mg via INTRAVENOUS
  Filled 2023-01-09: qty 100

## 2023-01-09 MED ORDER — ONDANSETRON HCL 4 MG/2ML IJ SOLN: 4.0000 mg | Freq: Four times a day (QID) | INTRAMUSCULAR | Status: DC | PRN

## 2023-01-09 MED ORDER — ORAL CARE MOUTH RINSE: 15.0000 mL | Freq: Once | OROMUCOSAL | Status: AC

## 2023-01-09 MED ORDER — CEFAZOLIN SODIUM-DEXTROSE 2-4 GM/100ML-% IV SOLN
2.0000 g | Freq: Four times a day (QID) | INTRAVENOUS | Status: AC
  Administered 2023-01-09 – 2023-01-10 (×3): 2 g via INTRAVENOUS
  Filled 2023-01-09 (×3): qty 100

## 2023-01-09 MED ORDER — BUPIVACAINE-MELOXICAM ER 400-12 MG/14ML IJ SOLN
INTRAMUSCULAR | Status: AC
  Filled 2023-01-09: qty 1

## 2023-01-09 MED ORDER — LIDOCAINE 2% (20 MG/ML) 5 ML SYRINGE
INTRAMUSCULAR | Status: DC | PRN
  Administered 2023-01-09: 40 mg via INTRAVENOUS

## 2023-01-09 MED ORDER — FENTANYL CITRATE (PF) 100 MCG/2ML IJ SOLN
25.0000 ug | INTRAMUSCULAR | Status: DC | PRN
  Administered 2023-01-09: 50 ug via INTRAVENOUS

## 2023-01-09 MED ORDER — ACETAMINOPHEN 500 MG PO TABS
1000.0000 mg | ORAL_TABLET | Freq: Four times a day (QID) | ORAL | Status: DC
  Administered 2023-01-09 – 2023-01-10 (×2): 1000 mg via ORAL
  Filled 2023-01-09 (×2): qty 2

## 2023-01-09 MED ORDER — DOCUSATE SODIUM 100 MG PO CAPS
100.0000 mg | ORAL_CAPSULE | Freq: Two times a day (BID) | ORAL | Status: DC
  Administered 2023-01-09 – 2023-01-17 (×15): 100 mg via ORAL
  Filled 2023-01-09 (×16): qty 1

## 2023-01-09 MED ORDER — DEXAMETHASONE SODIUM PHOSPHATE 10 MG/ML IJ SOLN
INTRAMUSCULAR | Status: AC
  Filled 2023-01-09: qty 1

## 2023-01-09 MED ORDER — SODIUM CHLORIDE 0.9 % IV SOLN: INTRAVENOUS | Status: DC

## 2023-01-09 MED ORDER — CHLORHEXIDINE GLUCONATE 0.12 % MT SOLN
OROMUCOSAL | Status: AC
  Administered 2023-01-09: 15 mL via OROMUCOSAL
  Filled 2023-01-09: qty 15

## 2023-01-09 MED ORDER — 0.9 % SODIUM CHLORIDE (POUR BTL) OPTIME
TOPICAL | Status: DC | PRN
  Administered 2023-01-09: 1000 mL

## 2023-01-09 MED ORDER — PROPOFOL 10 MG/ML IV BOLUS
INTRAVENOUS | Status: AC
  Filled 2023-01-09: qty 20

## 2023-01-09 MED ORDER — ONDANSETRON HCL 4 MG PO TABS: 4.0000 mg | ORAL_TABLET | Freq: Four times a day (QID) | ORAL | Status: DC | PRN

## 2023-01-09 MED ORDER — SORBITOL 70 % SOLN: 30.0000 mL | Freq: Every day | Status: DC | PRN

## 2023-01-09 MED ORDER — PHENYLEPHRINE 80 MCG/ML (10ML) SYRINGE FOR IV PUSH (FOR BLOOD PRESSURE SUPPORT)
PREFILLED_SYRINGE | INTRAVENOUS | Status: DC | PRN
  Administered 2023-01-09 (×3): 240 ug via INTRAVENOUS
  Administered 2023-01-09 (×2): 160 ug via INTRAVENOUS

## 2023-01-09 MED ORDER — FENTANYL CITRATE (PF) 100 MCG/2ML IJ SOLN
INTRAMUSCULAR | Status: DC | PRN
  Administered 2023-01-09: 50 ug via INTRAVENOUS
  Administered 2023-01-09 (×2): 100 ug via INTRAVENOUS

## 2023-01-09 MED ORDER — OXYCODONE HCL 5 MG PO TABS
5.0000 mg | ORAL_TABLET | ORAL | Status: DC | PRN
  Administered 2023-01-10 – 2023-01-12 (×4): 10 mg via ORAL
  Administered 2023-01-16: 5 mg via ORAL
  Filled 2023-01-09: qty 1
  Filled 2023-01-09 (×3): qty 2

## 2023-01-09 MED ORDER — POLYETHYLENE GLYCOL 3350 17 G PO PACK
17.0000 g | PACK | Freq: Every day | ORAL | Status: DC | PRN
  Filled 2023-01-09: qty 1

## 2023-01-09 MED ORDER — TRANEXAMIC ACID-NACL 1000-0.7 MG/100ML-% IV SOLN
1000.0000 mg | Freq: Once | INTRAVENOUS | Status: AC
  Administered 2023-01-09: 1000 mg via INTRAVENOUS
  Filled 2023-01-09: qty 100

## 2023-01-09 MED ORDER — SUGAMMADEX SODIUM 200 MG/2ML IV SOLN
INTRAVENOUS | Status: DC | PRN
  Administered 2023-01-09: 100 mg via INTRAVENOUS

## 2023-01-09 MED ORDER — MENTHOL 3 MG MT LOZG: 1.0000 | LOZENGE | OROMUCOSAL | Status: DC | PRN

## 2023-01-09 MED ORDER — PHENYLEPHRINE 80 MCG/ML (10ML) SYRINGE FOR IV PUSH (FOR BLOOD PRESSURE SUPPORT)
PREFILLED_SYRINGE | INTRAVENOUS | Status: AC
  Filled 2023-01-09: qty 10

## 2023-01-09 MED ORDER — METHOCARBAMOL 500 MG PO TABS
500.0000 mg | ORAL_TABLET | Freq: Four times a day (QID) | ORAL | Status: DC | PRN
  Administered 2023-01-09 – 2023-01-14 (×8): 500 mg via ORAL
  Filled 2023-01-09 (×8): qty 1

## 2023-01-09 MED ORDER — VANCOMYCIN HCL 1 G IV SOLR
INTRAVENOUS | Status: DC | PRN
  Administered 2023-01-09: 1000 mg via TOPICAL

## 2023-01-09 MED ORDER — MIDAZOLAM HCL 2 MG/2ML IJ SOLN
INTRAMUSCULAR | Status: AC
  Filled 2023-01-09: qty 2

## 2023-01-09 MED ORDER — POVIDONE-IODINE 10 % EX SWAB
2.0000 | Freq: Once | CUTANEOUS | Status: AC
  Administered 2023-01-09: 2 via TOPICAL

## 2023-01-09 MED ORDER — METHOCARBAMOL 1000 MG/10ML IJ SOLN: 500.0000 mg | Freq: Four times a day (QID) | INTRAVENOUS | Status: DC | PRN

## 2023-01-09 MED ORDER — SODIUM CHLORIDE 0.9 % IR SOLN
Status: DC | PRN
  Administered 2023-01-09: 1000 mL

## 2023-01-09 MED ORDER — MAGNESIUM CITRATE PO SOLN: 1.0000 | Freq: Once | ORAL | Status: DC | PRN

## 2023-01-09 MED ORDER — DEXAMETHASONE SODIUM PHOSPHATE 10 MG/ML IJ SOLN
INTRAMUSCULAR | Status: DC | PRN
  Administered 2023-01-09: 4 mg via INTRAVENOUS

## 2023-01-09 MED ORDER — INSULIN ASPART 100 UNIT/ML IJ SOLN
0.0000 [IU] | Freq: Three times a day (TID) | INTRAMUSCULAR | Status: DC
  Administered 2023-01-09 – 2023-01-10 (×2): 1 [IU] via SUBCUTANEOUS

## 2023-01-09 MED ORDER — PROPOFOL 10 MG/ML IV BOLUS
INTRAVENOUS | Status: DC | PRN
  Administered 2023-01-09: 90 mg via INTRAVENOUS

## 2023-01-09 MED ORDER — BUPIVACAINE-MELOXICAM ER 400-12 MG/14ML IJ SOLN
INTRAMUSCULAR | Status: DC | PRN
  Administered 2023-01-09: 400 mg

## 2023-01-09 MED ORDER — TRANEXAMIC ACID 1000 MG/10ML IV SOLN
INTRAVENOUS | Status: DC | PRN
  Administered 2023-01-09: 2000 mg via TOPICAL

## 2023-01-09 MED ORDER — MIDAZOLAM HCL 2 MG/2ML IJ SOLN
INTRAMUSCULAR | Status: DC | PRN
  Administered 2023-01-09: 2 mg via INTRAVENOUS

## 2023-01-09 MED ORDER — TRANEXAMIC ACID 1000 MG/10ML IV SOLN
2000.0000 mg | INTRAVENOUS | Status: DC
  Filled 2023-01-09 (×2): qty 20

## 2023-01-09 MED ORDER — PHENOL 1.4 % MT LIQD: 1.0000 | OROMUCOSAL | Status: DC | PRN

## 2023-01-09 MED ORDER — LIDOCAINE 2% (20 MG/ML) 5 ML SYRINGE
INTRAMUSCULAR | Status: AC
  Filled 2023-01-09: qty 5

## 2023-01-09 SURGICAL SUPPLY — 64 items
ADH SKN CLS APL DERMABOND .7 (GAUZE/BANDAGES/DRESSINGS) ×1
BAG COUNTER SPONGE SURGICOUNT (BAG) ×1 IMPLANT
BAG DECANTER FOR FLEXI CONT (MISCELLANEOUS) ×1 IMPLANT
BAG SPNG CNTER NS LX DISP (BAG) ×1
BLADE SAG 18X100X1.27 (BLADE) ×1 IMPLANT
COVER PERINEAL POST (MISCELLANEOUS) ×1 IMPLANT
COVER SURGICAL LIGHT HANDLE (MISCELLANEOUS) ×1 IMPLANT
DERMABOND ADVANCED .7 DNX12 (GAUZE/BANDAGES/DRESSINGS) IMPLANT
DRAPE C-ARM 42X72 X-RAY (DRAPES) ×1 IMPLANT
DRAPE POUCH INSTRU U-SHP 10X18 (DRAPES) ×1 IMPLANT
DRAPE STERI IOBAN 125X83 (DRAPES) ×1 IMPLANT
DRAPE U-SHAPE 47X51 STRL (DRAPES) ×2 IMPLANT
DRSG AQUACEL AG ADV 3.5X10 (GAUZE/BANDAGES/DRESSINGS) ×1 IMPLANT
DURAPREP 26ML APPLICATOR (WOUND CARE) ×2 IMPLANT
ELECT BLADE 4.0 EZ CLEAN MEGAD (MISCELLANEOUS) ×1
ELECT REM PT RETURN 9FT ADLT (ELECTROSURGICAL) ×1
ELECTRODE BLDE 4.0 EZ CLN MEGD (MISCELLANEOUS) ×1 IMPLANT
ELECTRODE REM PT RTRN 9FT ADLT (ELECTROSURGICAL) ×1 IMPLANT
FACESHIELD WRAPAROUND (MASK) ×1 IMPLANT
FACESHIELD WRAPAROUND OR TEAM (MASK) IMPLANT
GLOVE BIOGEL PI IND STRL 7.0 (GLOVE) ×2 IMPLANT
GLOVE BIOGEL PI IND STRL 7.5 (GLOVE) ×5 IMPLANT
GLOVE ECLIPSE 7.0 STRL STRAW (GLOVE) ×2 IMPLANT
GLOVE SKINSENSE STRL SZ7.5 (GLOVE) ×1 IMPLANT
GLOVE SURG SYN 7.5 E (GLOVE) ×2 IMPLANT
GLOVE SURG SYN 7.5 PF PI (GLOVE) ×2 IMPLANT
GLOVE SURG UNDER POLY LF SZ7 (GLOVE) ×3 IMPLANT
GLOVE SURG UNDER POLY LF SZ7.5 (GLOVE) ×2 IMPLANT
GOWN STRL REUS W/ TWL LRG LVL3 (GOWN DISPOSABLE) IMPLANT
GOWN STRL REUS W/ TWL XL LVL3 (GOWN DISPOSABLE) ×1 IMPLANT
GOWN STRL REUS W/TWL LRG LVL3 (GOWN DISPOSABLE)
GOWN STRL REUS W/TWL XL LVL3 (GOWN DISPOSABLE) ×1
GOWN STRL SURGICAL XL XLNG (GOWN DISPOSABLE) ×1 IMPLANT
GOWN TOGA ZIPPER T7+ PEEL AWAY (MISCELLANEOUS) ×2 IMPLANT
HANDPIECE INTERPULSE COAX TIP (DISPOSABLE) ×1
HEAD CERAMIC DELTA 36 PLUS 1.5 (Hips) IMPLANT
HOOD PEEL AWAY T7 (MISCELLANEOUS) ×1 IMPLANT
IV NS IRRIG 3000ML ARTHROMATIC (IV SOLUTION) ×1 IMPLANT
KIT BASIN OR (CUSTOM PROCEDURE TRAY) ×1 IMPLANT
LINER NEUTRAL 52X36MM PLUS 4 (Liner) IMPLANT
MARKER SKIN DUAL TIP RULER LAB (MISCELLANEOUS) ×1 IMPLANT
NDL SPNL 18GX3.5 QUINCKE PK (NEEDLE) ×1 IMPLANT
NEEDLE SPNL 18GX3.5 QUINCKE PK (NEEDLE) ×1 IMPLANT
PACK TOTAL JOINT (CUSTOM PROCEDURE TRAY) ×1 IMPLANT
PACK UNIVERSAL I (CUSTOM PROCEDURE TRAY) ×1 IMPLANT
PIN SECTOR W/GRIP ACE CUP 52MM (Hips) IMPLANT
SET HNDPC FAN SPRY TIP SCT (DISPOSABLE) ×1 IMPLANT
SOLUTION PRONTOSAN WOUND 350ML (IRRIGATION / IRRIGATOR) ×1 IMPLANT
STAPLER VISISTAT 35W (STAPLE) IMPLANT
STEM FEMORAL SZ6 HIGH ACTIS (Stem) IMPLANT
SUT ETHIBOND 2 V 37 (SUTURE) ×1 IMPLANT
SUT VIC AB 0 CT1 27 (SUTURE) ×1
SUT VIC AB 0 CT1 27XBRD ANBCTR (SUTURE) ×1 IMPLANT
SUT VIC AB 1 CTX 36 (SUTURE) ×1
SUT VIC AB 1 CTX36XBRD ANBCTR (SUTURE) ×1 IMPLANT
SUT VIC AB 2-0 CT1 27 (SUTURE) ×2
SUT VIC AB 2-0 CT1 TAPERPNT 27 (SUTURE) ×2 IMPLANT
SYR 50ML LL SCALE MARK (SYRINGE) ×1 IMPLANT
TOWEL GREEN STERILE (TOWEL DISPOSABLE) ×1 IMPLANT
TRAY CATH INTERMITTENT SS 16FR (CATHETERS) IMPLANT
TRAY FOLEY W/BAG SLVR 16FR (SET/KITS/TRAYS/PACK)
TRAY FOLEY W/BAG SLVR 16FR ST (SET/KITS/TRAYS/PACK) IMPLANT
TUBE SUCT ARGYLE STRL (TUBING) ×1 IMPLANT
YANKAUER SUCT BULB TIP NO VENT (SUCTIONS) ×1 IMPLANT

## 2023-01-09 NOTE — Transfer of Care (Signed)
Immediate Anesthesia Transfer of Care Note  Patient: Peter [name]  Procedure(s) Performed: TOTAL HIP ARTHROPLASTY ANTERIOR APPROACH (Left: Hip)  Patient Location: PACU  Anesthesia Type:General  Level of Consciousness: awake and patient cooperative  Airway & Oxygen Therapy: Patient Spontanous Breathing  Post-op Assessment: Report given to RN  Post vital signs: Reviewed and stable  Last Vitals:  Vitals Value Taken Time  BP 131/75 01/09/23 1641  Temp 36.6 C 01/09/23 1640  Pulse 105 01/09/23 1642  Resp 14 01/09/23 1642  SpO2 100 % 01/09/23 1642  Vitals shown include unvalidated device data.  Last Pain:  Vitals:   01/09/23 1300  TempSrc:   PainSc: 6       Patients Stated Pain Goal: 2 (01/08/23 2138)  Complications: No notable events documented.
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
## 2023-01-09 NOTE — Discharge Instructions (Signed)

## 2023-01-09 NOTE — Op Note (Signed)
TOTAL HIP ARTHROPLASTY ANTERIOR APPROACH  Procedure Note [facility]   [id number]  Pre-op Diagnosis: displaced Left femoral neck Fracture     Post-op Diagnosis: same  Operative Findings Femoral neck fracture   Operative Procedures  1. Total hip replacement; Left hip; uncemented [id number]   Surgeon: Gershon Mussel, M.D.  Assist: Hart Carwin, RNFA   Anesthesia: general  Prosthesis: Depuy Acetabulum: Pinnacle 52 mm Femur: Actis 6 HO Head: 36 mm size: +1.5 Liner: +4 Bearing Type: ceramic/poly  Total Hip Arthroplasty (Anterior Approach) Op Note:  After informed consent was obtained and the operative extremity marked in the holding area, the patient was brought back to the operating room and placed supine on the HANA table. Next, the operative extremity was prepped and draped in normal sterile fashion. Surgical timeout occurred verifying patient identification, surgical site, surgical procedure and administration of antibiotics.  A Hueter approach to the hip was performed, using the interval between tensor fascia lata and sartorius.  Dissection was carried bluntly down onto the anterior hip capsule. The lateral femoral circumflex vessels were identified and coagulated. A capsulotomy was performed and the capsular flaps tagged for later repair.  Fracture hematoma evacuated.  The neck osteotomy was performed 1 fingerbreadth above the lesser trochanter. The femoral head was removed, the acetabular rim was cleared of soft tissue and osteophytes and attention was turned to reaming the acetabulum.  Sequential reaming was performed under fluoroscopic guidance down to the floor of the cotyloid fossa. We reamed to a size 51 mm, and then impacted the acetabular shell.  The liner was then placed after irrigation and attention turned to the femur.  After placing the femoral hook, the leg was taken to externally rotated, extended and adducted position taking care to perform soft tissue releases to  allow for adequate mobilization of the femur. Soft tissue was cleared from the shoulder of the greater trochanter and the hook elevator used to improve exposure of the proximal femur. Sequential broaching performed up to a size 6. Trial neck and head were placed. The leg was brought back up to neutral and the construct reduced.  Antibiotic irrigation was placed in the surgical wound.  The position and sizing of components, offset and leg lengths were checked using fluoroscopy. Stability of the construct was checked in extension and external rotation without any subluxation, shuck or impingement of prosthesis. We dislocated the prosthesis, dropped the leg back into position, removed trial components, and irrigated copiously. The final stem and head was then placed, the leg brought back up, the system reduced and fluoroscopy used to verify positioning.  We irrigated, obtained hemostasis and closed the capsule using #2 ethibond suture.  One gram of vancomycin powder was placed in the surgical bed.   One gram of topical tranexamic acid was injected into the joint.  The fascia was closed with #1 vicryl plus, the deep fat layer was closed with 0 vicryl, the subcutaneous layers closed with 2.0 Vicryl Plus and the skin closed with 2.0 nylon and dermabond. A sterile dressing was applied. The patient was awakened in the operating room and taken to recovery in stable condition.  All sponge, needle, and instrument counts were correct at the end of the case.   Tessa Lerner, my PA, was a medical necessity for opening, closing, limb positioning, retracting, exposing, and overall facilitation and timely completion of the surgery.  Position: supine  Complications: see description of procedure.  Time Out: performed   Drains/Packing: none  Estimated blood loss:  see anesthesia record  Returned to Recovery Room: in good condition.   Antibiotics: yes   Mechanical VTE (DVT) Prophylaxis: sequential compression  devices, TED thigh-high  Chemical VTE (DVT) Prophylaxis: lovenox POD 1   Fluid Replacement: see anesthesia record  Specimens Removed: 1 to pathology   Sponge and Instrument Count Correct? yes   PACU: portable radiograph - low AP   Plan/RTC: Return in 2 weeks for staple removal. Weight Bearing/Load Lower Extremity: full  Hip precautions: none Suture Removal: 2 weeks   N. Glee Arvin, MD [facility] 4:02 PM   Implant Name Type Inv. Item Serial No. Manufacturer Lot No. LRB No. Used Action  PIN SECTOR W/GRIP ACE CUP - [id number] Hips PIN SECTOR W/GRIP ACE CUP  [employer] [id number] Left 1 Implanted  LINER NEUTRAL 52X36MM PLUS 4 - [id number] Liner LINER NEUTRAL [id number] PLUS 4  [employer]PU[employer] [id number] Left 1 Implanted  STEM FEMORAL SZ6 HIGH ACTIS - [id number] Stem STEM FEMORAL SZ6 HIGH ACTIS  DEPU[employer] ORTHOPA[employer] [id number] Left 1 Implanted  HEAD CERAMIC DELTA 36 PLUS 1.5 - [id number] Hips HEAD CERAMIC DELTA 36 PLUS 1.5  [employer] [id number] Left 1 Implanted

## 2023-01-09 NOTE — Progress Notes (Signed)
[facility]                                                                               Peter [name], is a 72 y.o. male, DOB - [birth year], WUJ:[id number] Admit date - 01/08/2023    Outpatient Primary MD for the patient is Katrinka Blazing Marlynn Perking., FNP  LOS - 1  days    Brief summary   Peter [name] is a 72 y.o. male with PMH significant for alcohol abuse, chronic smoker, COPD, DM 2, HTN, HLD, CVA with left-sided weakness, liver cirrhosis, chronic anemia, anxiety/depression, chronic pain, impaired mobility presented to the ED with left hip pain, patient reports falling last weekend on his left hip noticed some discomfort but was able to get around, this morning when he got up to use the bathroom he felt something pop in his left hip, this was followed by increased pain and inability to ambulate . He presented to ED and was found to have subcapital femur neck fracture on the left.    Assessment & Plan    Assessment and Plan:  Left femur neck fracture:  S/p total arthroplasty.  Pain control.  Therapy eval for tomorrow.    Alcohol abuse:  On valium BID prn.  Watch for withdrawal symptoms.    Copd:  No wheezing heard.  On duonebs.    CVA with left sided weakness:    Type 2 DM:  CBG (last 3)  Recent Labs    01/09/23 1259  GLUCAP 112*   Resume SSI.    Estimated body mass index is 19.29 kg/m as calculated from the following:   Height as of this encounter: 5' 7.5" (1.715 m).   Weight as of this encounter: 56.7 kg.  Code Status: full code.  DVT Prophylaxis:  SCDs Start: 01/08/23 1650   Level of Care: Level of care: Med-Surg Family Communication: none at bedside.   Disposition Plan:     Remains inpatient appropriate:  pending therapy evaluations.   Procedures:  Total hip replacement; Left hip; uncemented Dr Roda Shutters   Consultants:   Orthopedics Dr Roda Shutters  Antimicrobials:   Anti-infectives (From admission, onward)    Start     Dose/Rate Route Frequency  Ordered Stop   01/09/23 1501  vancomycin (VANCOCIN) powder          As needed 01/09/23 1501     01/09/23 1130  ceFAZolin (ANCEF) IVPB 2g/100 mL premix        2 g 200 mL/hr over 30 Minutes Intravenous On call to O.R. 01/09/23 1036 01/09/23 1450        Medications  Scheduled Meds:  [NFA Hold] atorvastatin  40 mg Oral QHS   [MAR Hold] diazepam  5 mg Oral BID   [MAR Hold] finasteride  5 mg Oral Daily   [MAR Hold] mirtazapine  15 mg Oral QHS   [MAR Hold] montelukast  10 mg Oral QHS   [MAR Hold] senna-docusate  1 tablet Oral BID   [MAR Hold] tamsulosin  0.4 mg Oral Daily   [MAR Hold] thiamine  100 mg Oral Daily   tranexamic acid (CYKLOKAPRON) 2,000 mg in sodium chloride 0.9 % 50 mL Topical  Application  2,000 mg Topical To OR   Continuous Infusions:  lactated ringers 10 mL/hr at 01/09/23 1306   PRN Meds:.0.9 % irrigation (POUR BTL), [MAR Hold] acetaminophen **OR** [MAR Hold] acetaminophen, bupivacaine-meloxicam ER, [MAR Hold]  HYDROmorphone (DILAUDID) injection, [MAR Hold] ondansetron **OR** [MAR Hold] ondansetron (ZOFRAN) IV, Prontosan Wound Irrigation - Optime, sodium chloride irrigation, tranexamic acid (CYKLOKAPRON) 2,000 mg in sodium chloride 0.9 % 50 mL Topical Application, vancomycin    Subjective:   Barton Fanny was seen and examined today.  Persistent left leg pain.   Objective:   Vitals:   01/08/23 2059 01/09/23 0350 01/09/23 1135 01/09/23 1258  BP: 127/70 120/77 113/67 106/71  Pulse: 77 73 78 75  Resp: 18 18 18 18   Temp: 98.1 F (36.7 C) 98 F (36.7 C) 98.6 F (37 C) 97.6 F (36.4 C)  TempSrc: Oral Oral Oral Oral  SpO2: 100% 100% 97% 95%  Weight:    56.7 kg  Height:    5' 7.5" (1.715 m)    Intake/Output Summary (Last 24 hours) at 01/09/2023 1628 Last data filed at 01/09/2023 1625 Gross per 24 hour  Intake 1000 ml  Output 100 ml  Net 900 ml   Filed Weights   01/08/23 0643 01/09/23 1258  Weight: 57.2 kg 56.7 kg     Exam General exam: Appears calm and  comfortable  Respiratory system: Clear to auscultation. Respiratory effort normal. Cardiovascular system: S1 & S2 heard, RRR. No JVD,  Gastrointestinal system: Abdomen is nondistended, soft and nontender.  Central nervous system: Alert and oriented. No focal neurological deficits. Extremities: painful rom of the left lower extremity.  Skin: No rashes,  Psychiatry:  Mood & affect appropriate.     Data Reviewed:  I have personally reviewed following labs and imaging studies   CBC Lab Results  Component Value Date   WBC 4.9 01/09/2023   RBC 5.01 01/09/2023   HGB 11.9 (L) 01/09/2023   HCT 37.3 (L) 01/09/2023   MCV 74.5 (L) 01/09/2023   MCH 23.8 (L) 01/09/2023   PLT 366 01/09/2023   MCHC 31.9 01/09/2023   RDW 16.2 (H) 01/09/2023   LYMPHSABS 1.7 01/08/2023   MONOABS 0.5 01/08/2023   EOSABS 0.1 01/08/2023   BASOSABS 0.1 01/08/2023     Last metabolic panel Lab Results  Component Value Date   NA 136 01/09/2023   K 4.5 01/09/2023   CL 101 01/09/2023   CO2 25 01/09/2023   BUN 15 01/09/2023   CREATININE 1.22 01/09/2023   GLUCOSE 110 (H) 01/09/2023   GFRNONAA >60 01/09/2023   GFRAA >60 09/07/2019   CALCIUM 9.2 01/09/2023   PHOS 2.9 09/28/2021   PROT 6.8 01/09/2023   ALBUMIN 3.5 01/09/2023   BILITOT 0.6 01/09/2023   ALKPHOS 72 01/09/2023   AST 16 01/09/2023   ALT 12 01/09/2023   ANIONGAP 10 01/09/2023    CBG (last 3)  Recent Labs    01/09/23 1259  GLUCAP 112*      Coagulation Profile: Recent Labs  Lab 01/08/23 0942 01/09/23 0320  INR 1.0 1.1     Radiology Studies: DG C-Arm 1-60 Min-No Report  Result Date: 01/09/2023 Fluoroscopy was utilized by the requesting physician.  No radiographic interpretation.   DG C-Arm 1-60 Min-No Report  Result Date: 01/09/2023 Fluoroscopy was utilized by the requesting physician.  No radiographic interpretation.   DG Hip Unilat With Pelvis 2-3 Views Left  Result Date: 01/08/2023 CLINICAL DATA:  Status post fall, left hip  pain EXAM: DG HIP (  WITH OR WITHOUT PELVIS) 2-3V LEFT COMPARISON:  None Available. FINDINGS: Generalized osteopenia. Acute mildly displaced left subcapital femoral neck fracture. No aggressive osseous lesion. Normal alignment. Ankylosis of bilateral SI joints. Soft tissue are unremarkable. No radiopaque foreign body or soft tissue emphysema. Peripheral vascular atherosclerotic disease. IMPRESSION: 1. Acute mildly displaced left subcapital femoral neck fracture. Electronically Signed   By: Elige Ko M.D.   On: 01/08/2023 08:57       Kathlen Mody M.D. [facility] 01/09/2023, 4:28 PM  Available via Epic secure chat 7am-7pm After 7 pm, please refer to night coverage provider listed on amion.

## 2023-01-09 NOTE — Plan of Care (Signed)
  Problem: Activity: Goal: Risk for activity intolerance will decrease Outcome: Progressing   Problem: Nutrition: Goal: Adequate nutrition will be maintained Outcome: Progressing   Problem: Coping: Goal: Level of anxiety will decrease Outcome: Progressing   

## 2023-01-09 NOTE — Anesthesia Preprocedure Evaluation (Addendum)
Anesthesia Evaluation  Patient identified by MRN, date of birth, ID band Patient awake    Reviewed: Allergy & Precautions, NPO status , Patient's Chart, lab work & pertinent test results  History of Anesthesia Complications Negative for: history of anesthetic complications  Airway Mallampati: III  TM Distance: >3 FB Neck ROM: Full   Comment: Previous grade I view with Miller 2, easy mask Dental  (+) Edentulous Upper, Edentulous Lower   Pulmonary neg shortness of breath, neg sleep apnea, COPD, neg recent URI, Current Smoker and Patient abstained from smoking.   Pulmonary exam normal breath sounds clear to auscultation       Cardiovascular hypertension, (-) angina +CHF  (-) Past MI, (-) Cardiac Stents and (-) CABG (-) dysrhythmias  Rhythm:Regular Rate:Normal  TTE 07/22/2018: Study Conclusions   - Left ventricle: The cavity size was normal. Wall thickness was    normal. Systolic function was normal. The estimated ejection    fraction was in the range of 55% to 60%. Wall motion was normal;    there were no regional wall motion abnormalities. Left    ventricular diastolic function parameters were normal.  - Mitral valve: There was mild regurgitation.     Neuro/Psych neg Seizures PSYCHIATRIC DISORDERS Anxiety Depression    CVA (left-sided weakness), Residual Symptoms    GI/Hepatic negative GI ROS,,,(+) Cirrhosis     substance abuse (reports drinking 2 beers a day, last drank the night prior to admission)  alcohol use  Endo/Other  diabetes    Renal/GU negative Renal ROS     Musculoskeletal negative musculoskeletal ROS (+)    Abdominal   Peds  Hematology  (+) Blood dyscrasia, anemia   Anesthesia Other Findings   Reproductive/Obstetrics                              Anesthesia Physical Anesthesia Plan  ASA: 3  Anesthesia Plan: General   Post-op Pain Management: Dilaudid IV   Induction:  Intravenous  PONV Risk Score and Plan: 2 and Treatment may vary due to age or medical condition, Ondansetron and Dexamethasone  Airway Management Planned: Oral ETT  Additional Equipment: None  Intra-op Plan:   Post-operative Plan: Extubation in OR  Informed Consent: I have reviewed the patients History and Physical, chart, labs and discussed the procedure including the risks, benefits and alternatives for the proposed anesthesia with the patient or authorized representative who has indicated his/her understanding and acceptance.     Dental advisory given  Plan Discussed with: CRNA and Anesthesiologist  Anesthesia Plan Comments: (Risks of general anesthesia discussed including, but not limited to, sore throat, hoarse voice, chipped/damaged teeth, injury to vocal cords, nausea and vomiting, allergic reactions, lung infection, heart attack, stroke, and death. All questions answered. )         Anesthesia Quick Evaluation

## 2023-01-09 NOTE — Anesthesia Procedure Notes (Signed)
Procedure Name: Intubation Date/Time: 01/09/2023 2:49 PM  Performed by: De Nurse, CRNAPre-anesthesia Checklist: Patient identified, Emergency Drugs available, Suction available and Patient being monitored Patient Re-evaluated:Patient Re-evaluated prior to induction Oxygen Delivery Method: Circle System Utilized Preoxygenation: Pre-oxygenation with 100% oxygen Induction Type: IV induction Ventilation: Mask ventilation without difficulty Laryngoscope Size: Mac and 4 Grade View: Grade I Tube type: Oral Tube size: 7.5 mm Number of attempts: 1 Airway Equipment and Method: Stylet and Oral airway Placement Confirmation: ETT inserted through vocal cords under direct vision, positive ETCO2 and breath sounds checked- equal and bilateral Secured at: 22 cm Tube secured with: Tape Dental Injury: Teeth and Oropharynx as per pre-operative assessment

## 2023-01-10 DIAGNOSIS — S72012A Unspecified intracapsular fracture of left femur, initial encounter for closed fracture: Secondary | ICD-10-CM | POA: Diagnosis not present

## 2023-01-10 LAB — GLUCOSE, CAPILLARY
Glucose-Capillary: 139 mg/dL — ABNORMAL HIGH (ref 70–99)
Glucose-Capillary: 90 mg/dL (ref 70–99)
Glucose-Capillary: 94 mg/dL (ref 70–99)
Glucose-Capillary: 83 mg/dL (ref 70–99)

## 2023-01-10 LAB — CBC WITH DIFFERENTIAL/PLATELET
Abs Immature Granulocytes: 0.01 10*3/uL (ref 0.00–0.07)
Basophils Absolute: 0 10*3/uL (ref 0.0–0.1)
Basophils Relative: 0 %
Eosinophils Absolute: 0 10*3/uL (ref 0.0–0.5)
Eosinophils Relative: 0 %
HCT: 31.9 % — ABNORMAL LOW (ref 39.0–52.0)
Hemoglobin: 10.2 g/dL — ABNORMAL LOW (ref 13.0–17.0)
Immature Granulocytes: 0 %
Lymphocytes Relative: 14 %
Lymphs Abs: 0.8 10*3/uL (ref 0.7–4.0)
MCH: 23.9 pg — ABNORMAL LOW (ref 26.0–34.0)
MCHC: 32 g/dL (ref 30.0–36.0)
MCV: 74.7 fL — ABNORMAL LOW (ref 80.0–100.0)
Monocytes Absolute: 0.5 10*3/uL (ref 0.1–1.0)
Monocytes Relative: 9 %
Neutro Abs: 4.6 10*3/uL (ref 1.7–7.7)
Neutrophils Relative %: 77 %
Platelets: 317 10*3/uL (ref 150–400)
RBC: 4.27 MIL/uL (ref 4.22–5.81)
RDW: 16.2 % — ABNORMAL HIGH (ref 11.5–15.5)
WBC: 5.9 10*3/uL (ref 4.0–10.5)
nRBC: 0 % (ref 0.0–0.2)

## 2023-01-10 LAB — BASIC METABOLIC PANEL
Anion gap: 7 (ref 5–15)
BUN: 11 mg/dL (ref 8–23)
CO2: 25 mmol/L (ref 22–32)
Calcium: 8.3 mg/dL — ABNORMAL LOW (ref 8.9–10.3)
Chloride: 102 mmol/L (ref 98–111)
Creatinine, Ser: 1.07 mg/dL (ref 0.61–1.24)
GFR, Estimated: >60 mL/min (ref >60)
Glucose, Bld: 157 mg/dL — ABNORMAL HIGH (ref 70–99)
Potassium: 4.2 mmol/L (ref 3.5–5.1)
Sodium: 134 mmol/L — ABNORMAL LOW (ref 135–145)

## 2023-01-10 MED ORDER — OXYCODONE HCL 5 MG PO TABS: 5.0000 mg | ORAL_TABLET | Freq: Three times a day (TID) | ORAL | 0 refills | Status: DC | PRN

## 2023-01-10 MED ORDER — ENOXAPARIN SODIUM 40 MG/0.4ML IJ SOSY: 40.0000 mg | PREFILLED_SYRINGE | Freq: Every day | INTRAMUSCULAR | 0 refills | Status: DC

## 2023-01-10 MED ORDER — ACETAMINOPHEN 500 MG PO TABS
500.0000 mg | ORAL_TABLET | Freq: Four times a day (QID) | ORAL | Status: DC | PRN
  Administered 2023-01-10 – 2023-01-15 (×7): 500 mg via ORAL
  Filled 2023-01-10 (×7): qty 1

## 2023-01-10 NOTE — Evaluation (Signed)
Physical Therapy Evaluation Patient Details Name: Peter [name] MRN: [id number] DOB: Nov 03, 1950 Today's Date: 01/10/2023  History of Present Illness  The pt is a 72 yo male presenting 7/4 after feeling a pop in his L hip while walking, pt reports unable to ambulate after the pop. Pt found to have femoral neck fx, now s/p L THA 7/5. PMH includes: anemia, anxiety, cirrhosis of liver, DM II, ETOH abuse, emphysema, HLD, HTN, and stroke with L-sided weakness.   Clinical Impression  Pt in bed upon arrival of PT, agreeable to evaluation at this time. Prior to admission the pt was independent with mobility, reports intermittent use of cane at home, but primarily sedentary spending "most" of each day in bed. The pt required supervision for bed mobility, and minA to minG for OOB transfers and ambulation. He was able to use RW with good stability, and accept progressively increased weight on LLE. The pt is hopeful to progress back to home with his brother for support who works limited hours. The pt will need continued skilled PT to be safe to return home, but anticipate with additional acute PT he could progress to d/c home with brother and friends for support as needed. However, if pt continues to need minA to rise to standing and family cannot provide assist/supervision as pt stated during evaluation, may need short stint SNF to return to independence prior to return home.          Assistance Recommended at Discharge Frequent or constant Supervision/Assistance  If plan is discharge home, recommend the following:  Can travel by private vehicle  A little help with walking and/or transfers;A little help with bathing/dressing/bathroom;Assistance with cooking/housework;Direct supervision/assist for financial management;Assist for transportation;Help with stairs or ramp for entrance        Equipment Recommendations Rolling walker (2 wheels);BSC/3in1  Recommendations for Other Services       Functional  Status Assessment Patient has had a recent decline in their functional status and demonstrates the ability to make significant improvements in function in a reasonable and predictable amount of time.     Precautions / Restrictions Precautions Precautions: Fall Restrictions Weight Bearing Restrictions: Yes LLE Weight Bearing: Weight bearing as tolerated      Mobility  Bed Mobility Overal bed mobility: Needs Assistance Bed Mobility: Supine to Sit     Supine to sit: Supervision          Transfers Overall transfer level: Needs assistance Equipment used: Rolling walker (2 wheels) Transfers: Sit to/from Stand Sit to Stand: Min assist           General transfer comment: minA to power up to standing initially, minG following reps    Ambulation/Gait Ambulation/Gait assistance: Min guard Gait Distance (Feet): 100 Feet Assistive device: Rolling walker (2 wheels) Gait Pattern/deviations: Step-to pattern, Step-through pattern, Decreased stance time - left, Decreased weight shift to left, Knee flexed in stance - left, Antalgic Gait velocity: decreased Gait velocity interpretation: <1.31 ft/sec, indicative of household ambulator   General Gait Details: small steps with progression to slight step-through pattern. decreased knee ext with LLE no overt buckling     Balance Overall balance assessment: Needs assistance Sitting-balance support: No upper extremity supported, Feet supported Sitting balance-Leahy Scale: Good     Standing balance support: Bilateral upper extremity supported, During functional activity Standing balance-Leahy Scale: Poor Standing balance comment: dependent on BUE support for gait  Pertinent Vitals/Pain Pain Assessment Pain Assessment: 0-10 Pain Score: 6  Pain Location: R hip and knee Pain Descriptors / Indicators: Burning, Aching, Sharp Pain Intervention(s): Limited activity within patient's tolerance,  Monitored during session, Repositioned    Home Living Family/patient expects to be discharged to:: Private residence Living Arrangements: Other (Comment) (rooming house) Available Help at Discharge: Family;Available PRN/intermittently (brother, works but "not many hours") Type of Home: Other(Comment) ("rooming house") Home Access: Ramped entrance       Home Layout: One level Home Equipment: Medical laboratory scientific officer - single point Additional Comments: pt reports bedroom at rooming house with brother, has a communal kitchen about 10 ft from pt's room    Prior Function Prior Level of Function : Independent/Modified Independent             Mobility Comments: pt reports sometimes riding a bike, indpeendent ADLs Comments: pt reports no issues with ADLs, brother does the cooking at home     Hand Dominance   Dominant Hand: Left    Extremity/Trunk Assessment   Upper Extremity Assessment Upper Extremity Assessment: Defer to OT evaluation    Lower Extremity Assessment Lower Extremity Assessment: LLE deficits/detail LLE Deficits / Details: limited by pain at hip, diminished sensation lateral aspect of L thigh, normal at knee and below. able to complete AROM at hip, against mod resistance at knee due to pain, full strength at ankle LLE: Unable to fully assess due to pain LLE Sensation: decreased light touch (in thigh) LLE Coordination: WNL    Cervical / Trunk Assessment Cervical / Trunk Assessment: Kyphotic  Communication   Communication: No difficulties  Cognition Arousal/Alertness: Awake/alert Behavior During Therapy: WFL for tasks assessed/performed Overall Cognitive Status: Impaired/Different from baseline Area of Impairment: Orientation, Safety/judgement, Awareness, Problem solving                 Orientation Level: Disoriented to, Time (states thursday, knows july, unable to state most recent holiday)       Safety/Judgement: Decreased awareness of safety, Decreased awareness of  deficits Awareness: Intellectual Problem Solving: Slow processing, Decreased initiation, Difficulty sequencing, Requires verbal cues, Requires tactile cues General Comments: pt agreeable, able to follow all instructions. poor insight to safety awareness, need for assistance, and dependent on PT to suggest safe adjustments        General Comments General comments (skin integrity, edema, etc.): VSS on RA, condom cath off when pt attempting to urinate. given urinal    Exercises     Assessment/Plan    PT Assessment Patient needs continued PT services  PT Problem List Decreased strength;Decreased range of motion;Decreased activity tolerance;Decreased balance;Decreased mobility;Decreased safety awareness       PT Treatment Interventions DME instruction;Gait training;Stair training;Functional mobility training;Therapeutic activities;Therapeutic exercise;Balance training;Patient/family education    PT Goals (Current goals can be found in the Care Plan section)  Acute Rehab PT Goals Patient Stated Goal: return home PT Goal Formulation: With patient Time For Goal Achievement: 01/24/23 Potential to Achieve Goals: Good    Frequency Min 3X/week        AM-PAC PT "6 Clicks" Mobility  Outcome Measure Help needed turning from your back to your side while in a flat bed without using bedrails?: A Little Help needed moving from lying on your back to sitting on the side of a flat bed without using bedrails?: A Little Help needed moving to and from a bed to a chair (including a wheelchair)?: A Little Help needed standing up from a chair using your arms (e.g., wheelchair or  bedside chair)?: A Little Help needed to walk in hospital room?: A Little Help needed climbing 3-5 steps with a railing? : A Little 6 Click Score: 18    End of Session Equipment Utilized During Treatment: Gait belt Activity Tolerance: Patient tolerated treatment well Patient left: in chair;with call bell/phone within  reach;with chair alarm set Nurse Communication: Mobility status PT Visit Diagnosis: Unsteadiness on feet (R26.81);Muscle weakness (generalized) (M62.81);Pain Pain - Right/Left: Left Pain - part of body: Hip;Leg    Time: 1610-9604 PT Time Calculation (min) (ACUTE ONLY): 42 min   Charges:   PT Evaluation $PT Eval Low Complexity: 1 Low PT Treatments $Gait Training: 8-22 mins $Therapeutic Exercise: 8-22 mins PT General Charges $$ ACUTE PT VISIT: 1 Visit         Vickki Muff, PT, DPT   Acute Rehabilitation Department Office [phone] Secure Chat Communication Preferred  Ronnie Derby 01/10/2023, 10:35 AM

## 2023-01-10 NOTE — Evaluation (Signed)
Occupational Therapy Evaluation Patient Details Name: Peter [name] MRN: [id number] DOB: 1951-06-28 Today's Date: 01/10/2023   History of Present Illness The pt is a 72 yo male presenting 7/4 after feeling a pop in his L hip while walking, pt reports unable to ambulate after the pop. Pt found to have femoral neck fx, now s/p L THA 7/5. PMH includes: anemia, anxiety, cirrhosis of liver, DM II, ETOH abuse, emphysema, HLD, HTN, and stroke with L-sided weakness.   Clinical Impression   Pt s/p above diagnosis. Pt doing well, motivated to participate, improve function, and return home. Pt lives in a room house with brother, has help intermittently throughout the day if needed. PLOF uses cane sometimes, ind with ADLs. Pt currently displays overall good safety awareness, took his time with transfers/ADLs, instructed on use of sock aide/reacher for LB dressing, displays good carryover of learned skills. Pt able to perform transfers/mobility supervision with RW. Pt would benefit from rollator to allow for ability to ambulate to perform IADLs, sponge bathing at sink while sitting, etc. Pt would benefit from continued skilled acute therapy to maximize ADLs, functional strength, and activity tolerance prior to DC.    Recommendations for follow up therapy are one component of a multi-disciplinary discharge planning process, led by the attending physician.  Recommendations may be updated based on patient status, additional functional criteria and insurance authorization.   Assistance Recommended at Discharge Set up Supervision/Assistance  Patient can return home with the following A little help with walking and/or transfers;A little help with bathing/dressing/bathroom;Assistance with cooking/housework;Assist for transportation;Help with stairs or ramp for entrance    Functional Status Assessment  Patient has had a recent decline in their functional status and demonstrates the ability to make significant  improvements in function in a reasonable and predictable amount of time.  Equipment Recommendations  Other (comment) (rollator)    Recommendations for Other Services       Precautions / Restrictions Precautions Precautions: Fall Restrictions Weight Bearing Restrictions: Yes LLE Weight Bearing: Weight bearing as tolerated      Mobility Bed Mobility Overal bed mobility: Modified Independent             General bed mobility comments: arrived with Pt in recliner, assisted to bed at end of session, able to sit to supine with mod I    Transfers Overall transfer level: Needs assistance Equipment used: Rolling walker (2 wheels) Transfers: Sit to/from Stand, Bed to chair/wheelchair/BSC Sit to Stand: Supervision     Step pivot transfers: Supervision     General transfer comment: supervision using RW, good ability to power up from toilet without using GB      Balance Overall balance assessment: Needs assistance Sitting-balance support: No upper extremity supported, Feet supported Sitting balance-Leahy Scale: Good Sitting balance - Comments: recliner ADLs   Standing balance support: Bilateral upper extremity supported, During functional activity Standing balance-Leahy Scale: Fair Standing balance comment: able to stand at sink performing grooming/hand hygiene unsupported, no LOB.                           ADL either performed or assessed with clinical judgement   ADL Overall ADL's : Needs assistance/impaired Eating/Feeding: Independent   Grooming: Supervision/safety;Standing   Upper Body Bathing: Set up;Supervision/ safety;Sitting   Lower Body Bathing: Minimal assistance;Sitting/lateral leans   Upper Body Dressing : Set up;Sitting   Lower Body Dressing: Set up;With adaptive equipment;Sit to/from stand   Toilet Transfer: Supervision/safety;Rolling walker (2  wheels)   Toileting- Architect and Hygiene: Modified independent   Tub/ Hospital doctor: Supervision/safety   Functional mobility during ADLs: Supervision/safety;Rolling walker (2 wheels) General ADL Comments: Pt overall displays good ability to complete tasks, instructed on LB dressing with reacher/sock aide, may need some help with LB bathing, but instructed to perform sponge bathing at sink for now     Vision         Perception     Praxis      Pertinent Vitals/Pain Pain Assessment Pain Assessment: 0-10 Pain Score: 7  Pain Location: R hip and knee Pain Descriptors / Indicators: Burning, Aching, Sharp Pain Intervention(s): Monitored during session     Hand Dominance Left   Extremity/Trunk Assessment Upper Extremity Assessment Upper Extremity Assessment: Overall WFL for tasks assessed   Lower Extremity Assessment Lower Extremity Assessment: Defer to PT evaluation       Communication Communication Communication: No difficulties   Cognition Arousal/Alertness: Awake/alert Behavior During Therapy: WFL for tasks assessed/performed Overall Cognitive Status: Impaired/Different from baseline                                 General Comments: Pt able to answer all questions, some increased time to answer certain questions, looked at calendar when asked date, able to state 2024.     General Comments       Exercises     Shoulder Instructions      Home Living Family/patient expects to be discharged to:: Private residence Living Arrangements: Other (Comment) (rooming house) Available Help at Discharge: Family;Available PRN/intermittently (brother, works but "not many hours") Type of Home: Other(Comment) ("rooming house") Home Access: Ramped entrance     Home Layout: One level     Bathroom Shower/Tub: Chief Strategy Officer: Handicapped height Bathroom Accessibility: Yes   Home Equipment: Medical laboratory scientific officer - single point   Additional Comments: pt reports bedroom at rooming house with brother, has a communal kitchen about 10 ft  from pt's room      Prior Functioning/Environment Prior Level of Function : Independent/Modified Independent             Mobility Comments: pt reports sometimes riding a bike, indpeendent ADLs Comments: pt reports no issues with ADLs, brother does the cooking at home        OT Problem List: Decreased strength;Decreased range of motion;Pain;Impaired balance (sitting and/or standing)      OT Treatment/Interventions: Self-care/ADL training;Therapeutic exercise;Energy conservation;DME and/or AE instruction;Therapeutic activities    OT Goals(Current goals can be found in the care plan section) Acute Rehab OT Goals Patient Stated Goal: to return home, wants rollator for ambulation around room and able to sit to perform IADLs OT Goal Formulation: With patient Time For Goal Achievement: 01/24/23 Potential to Achieve Goals: Good  OT Frequency: Min 2X/week    Co-evaluation              AM-PAC OT "6 Clicks" Daily Activity     Outcome Measure Help from another person eating meals?: None Help from another person taking care of personal grooming?: A Little Help from another person toileting, which includes using toliet, bedpan, or urinal?: A Little Help from another person bathing (including washing, rinsing, drying)?: A Little Help from another person to put on and taking off regular upper body clothing?: A Little Help from another person to put on and taking off regular lower body clothing?: A Little 6 Click Score:  19   End of Session Equipment Utilized During Treatment: Gait belt;Rolling walker (2 wheels) Nurse Communication: Mobility status  Activity Tolerance: Patient tolerated treatment well Patient left: in bed;with call bell/phone within reach;with bed alarm set  OT Visit Diagnosis: Unsteadiness on feet (R26.81);Other abnormalities of gait and mobility (R26.89);Muscle weakness (generalized) (M62.81);Pain Pain - Right/Left: Left Pain - part of body: Hip                 Time: 1115-1145 OT Time Calculation (min): 30 min Charges:  OT General Charges $OT Visit: 1 Visit OT Evaluation $OT Eval Low Complexity: 1 Low OT Treatments $Self Care/Home Management : 8-22 mins  [place], OTR/L   Alexis Goodell 01/10/2023, 1:39 PM

## 2023-01-10 NOTE — Plan of Care (Signed)

## 2023-01-10 NOTE — Progress Notes (Signed)
Peter [name]  QMV:[id number] DOB: June 18, 1951 DOA: 01/08/2023 PCP: Raymon Mutton., FNP    Brief Narrative:  72 year old with a history of alcoholism, tobacco abuse, COPD, DM2, HTN, HLD, cirrhosis of the liver, anxiety/depression, and CVA with chronic left-sided weakness who reported a mechanical fall landing on his left hip after which he experienced persistent left hip pain.  In the ER he was diagnosed with a left subcapital femoral neck fracture.  Goals of Care:  Code Status: Full Code   DVT prophylaxis: Lovenox  Interim Hx: Afebrile.  Vital signs stable.  CBG is well-controlled.  Hemoglobin is stable.  Reports pain is well-controlled at the time of my visit.  Assessment & Plan:  Displaced left femoral neck fracture status post mechanical fall Status post left total hip replacement 01/09/2023 by Dr. Roda Shutters -postoperative care and DVT prophylaxis per orthopedics -the patient will be due for staple/suture removal 2 weeks postoperatively  Alcoholism Continue prophylactic low-dose benzo for now -no withdrawal symptoms presently  Alcoholic cirrhosis of the liver No acute issues at present  DM2 CBG well-controlled at present - A1c 5.9 with patient not on home medical therapy  COPD Well compensated presently  History of CVA with chronic left-sided weakness  Family Communication: No family present at time of exam Disposition: To be determined by PT/OT evaluations   Objective: Blood pressure (!) 98/54, pulse 95, temperature 98 F (36.7 C), resp. rate 18, height 5' 7.5" (1.715 m), weight 56.7 kg, SpO2 92 %.  Intake/Output Summary (Last 24 hours) at 01/10/2023 0833 Last data filed at 01/10/2023 0417 Gross per 24 hour  Intake 1710.09 ml  Output 750 ml  Net 960.09 ml   Filed Weights   01/08/23 0643 01/09/23 1258  Weight: 57.2 kg 56.7 kg    Examination: General: No acute respiratory distress Lungs: Clear to auscultation bilaterally without wheezes or crackles Cardiovascular:  Regular rate and rhythm without murmur gallop or rub normal S1 and S2 Abdomen: Nontender, nondistended, soft, bowel sounds positive, no rebound, no ascites, no appreciable mass Extremities: No significant edema BLE  CBC: Recent Labs  Lab 01/08/23 0942 01/09/23 0320 01/09/23 2216 01/10/23 0122  WBC 6.1 4.9 7.1 5.9  NEUTROABS 3.8  --   --  4.6  HGB 11.9* 11.9* 10.9* 10.2*  HCT 37.7* 37.3* 34.1* 31.9*  MCV 76.9* 74.5* 74.8* 74.7*  PLT 389 366 345 317   Basic Metabolic Panel: Recent Labs  Lab 01/08/23 0942 01/09/23 0320 01/09/23 2216 01/10/23 0122  NA 134* 136  --  134*  K 4.0 4.5  --  4.2  CL 100 101  --  102  CO2 22 25  --  25  GLUCOSE 70 110*  --  157*  BUN 13 15  --  11  CREATININE 1.10 1.22 1.27* 1.07  CALCIUM 8.9 9.2  --  8.3*   GFR: Estimated Creatinine Clearance: 52.3 mL/min (by C-G formula based on SCr of 1.07 mg/dL).   Scheduled Meds:  acetaminophen  1,000 mg Oral Q6H   atorvastatin  40 mg Oral QHS   diazepam  5 mg Oral BID   docusate sodium  100 mg Oral BID   enoxaparin (LOVENOX) injection  40 mg Subcutaneous Q24H   finasteride  5 mg Oral Daily   insulin aspart  0-9 Units Subcutaneous TID WC   mirtazapine  15 mg Oral QHS   montelukast  10 mg Oral QHS   senna-docusate  1 tablet Oral BID   tamsulosin  0.4 mg  Oral Daily   thiamine  100 mg Oral Daily   Continuous Infusions:  sodium chloride 75 mL/hr at 01/10/23 0417   methocarbamol (ROBAXIN) IV       LOS: 2 days   Lonia Blood, MD [facility] Office  [phone] Pager - Text Page per Amion  If 7PM-7AM, please contact night-coverage per Amion 01/10/2023, 8:33 AM

## 2023-01-10 NOTE — Anesthesia Postprocedure Evaluation (Signed)
Anesthesia Post Note  Patient: Peter [name]  Procedure(s) Performed: TOTAL HIP ARTHROPLASTY ANTERIOR APPROACH (Left: Hip)     Patient location during evaluation: PACU Anesthesia Type: General Level of consciousness: awake Pain management: pain level controlled Vital Signs Assessment: post-procedure vital signs reviewed and stable Respiratory status: spontaneous breathing, nonlabored ventilation and respiratory function stable Cardiovascular status: blood pressure returned to baseline and stable Postop Assessment: no apparent nausea or vomiting Anesthetic complications: no   No notable events documented.  Last Vitals:  Vitals:   01/10/23 0427 01/10/23 0738  BP: 98/62 (!) 98/54  Pulse: 70 95  Resp: 18 18  Temp:  36.7 C  SpO2: 99% 92%    Last Pain:  Vitals:   01/10/23 0040  TempSrc:   PainSc: Asleep                 Caelan Branden P Delilah Mulgrew

## 2023-01-10 NOTE — Progress Notes (Signed)
   Subjective:  Patient reports pain as mild.  No events.  Objective:   VITALS:   Vitals:   01/09/23 1938 01/10/23 0000 01/10/23 0427 01/10/23 0738  BP: 96/62 95/61 98/62  (!) 98/54  Pulse: 92 72 70 95  Resp: 18 17 18 18   Temp: 97.8 F (36.6 C) 98.6 F (37 C)  98 F (36.7 C)  TempSrc: Oral Oral    SpO2: 99% 100% 99% 92%  Weight:      Height:        Intact pulses distally Dorsiflexion/Plantar flexion intact Incision: dressing C/D/I and no drainage   Lab Results  Component Value Date   WBC 5.9 01/10/2023   HGB 10.2 (L) 01/10/2023   HCT 31.9 (L) 01/10/2023   MCV 74.7 (L) 01/10/2023   PLT 317 01/10/2023     Assessment/Plan:  1 Day Post-Op   - Expected postop acute blood loss anemia - Up with PT/OT - DVT ppx - SCDs, ambulation, lovenox to start today - WBAT operative extremity - may need placement in SNF based on social situation - CIWA protocol  Peter [name] 01/10/2023, 9:15 AM

## 2023-01-10 NOTE — TOC Initial Note (Addendum)
Transition of Care [facility]) - Initial/Assessment Note    Patient Details  Name: Peter [name] MRN: [id number] Date of Birth: [birth year]  [facility]) CM/SW Contact:    Ronny Bacon, RN Phone Number: 01/10/2023, 12:58 PM  Clinical Narrative:  Patient lives home with a friend. Patient uses a bus for transportation to PCP and pharmacy. HH PT set up through [facility]. DME equipment: RW and BSC set up through [employer]. Patient reports able to find ride home when discharged home.                Attending notified of need for [facility] orders and DME equipment.  Expected Discharge Plan: Home w Home Health Services Barriers to Discharge: Continued Medical Work up   Patient Goals and CMS Choice Patient states their goals for this hospitalization and ongoing recovery are:: To go home          Expected Discharge Plan and Services       Living arrangements for the past 2 months: Single Family Home                 DME Arranged: Walker rolling, 3-N-1 DME Agency: [employer] Date DME Agency Contacted: 01/10/23 Time DME Agency Contacted: 1250 Representative spoke with at DME Agency: Jasmine HH Arranged: PT HH Agency: [facility] Date [facility] Agency Contacted: 01/10/23 Time HH Agency Contacted: 1250 Representative spoke with at [facility] Agency: Kandee Keen  Prior Living Arrangements/Services Living arrangements for the past 2 months: Single Family Home Lives with:: Friends Patient language and need for interpreter reviewed:: Yes Do you feel safe going back to the place where you live?: Yes      Need for Family Participation in Patient Care: No (Comment) Care giver support system in place?: Yes (comment)   Criminal Activity/Legal Involvement Pertinent to Current Situation/Hospitalization: No - Comment as needed  Activities of Daily Living Home Assistive Devices/Equipment: None ADL Screening (condition at time of admission) Patient's cognitive ability adequate to safely complete  daily activities?: Yes Is the patient deaf or have difficulty hearing?: No Does the patient have difficulty seeing, even when wearing glasses/contacts?: Yes Does the patient have difficulty concentrating, remembering, or making decisions?: No Patient able to express need for assistance with ADLs?: Yes Does the patient have difficulty dressing or bathing?: No Independently performs ADLs?: Yes (appropriate for developmental age) Does the patient have difficulty walking or climbing stairs?: No Weakness of Legs: None Weakness of Arms/Hands: None  Permission Sought/Granted Permission sought to share information with : Case Manager, Photographer granted to share info w AGENCY: [employer] [facility] [employer]        Emotional Assessment   Attitude/Demeanor/Rapport: Engaged Affect (typically observed): Appropriate Orientation: : Oriented to Self, Oriented to Place, Oriented to  Time, Oriented to Situation Alcohol / Substance Use: Not Applicable Psych Involvement: No (comment)  Admission diagnosis:  Hip fracture (HCC) [S72.009A] Patient Active Problem List   Diagnosis Date Noted   Closed subcapital fracture of neck of left femur, initial encounter (HCC) 01/08/2023   Dyslipidemia 09/27/2021   BPH (benign prostatic hyperplasia) 09/27/2021   Acute on chronic anemia 09/27/2021   History of CVA (cerebrovascular accident) 09/27/2021   Acute metabolic encephalopathy 09/27/2021   AKI (acute kidney injury) (HCC) 09/27/2021   Olecranon fracture, right, closed, initial encounter 01/02/2019   Closed fracture of right olecranon process 01/02/2019   Pain in right leg 09/25/2017   Aortic atherosclerosis (HCC) 04/21/2017  Iron deficiency anemia 04/21/2017   Nodule of upper lobe of right lung 04/21/2017   Closed fracture of right fibula and tibia    Displaced fracture of fifth metatarsal bone, right foot, initial encounter for closed fracture    Postoperative fever 04/19/2017    Displaced oblique fracture of shaft of right tibia, initial encounter for closed fracture 04/16/2017   Closed fracture of lateral portion of right tibial plateau 04/16/2017   Tibial fracture 04/16/2017   Hypotension, postural 01/05/2015   UTI (lower urinary tract infection) 01/05/2015   Acute renal failure (HCC) 01/05/2015   ARF (acute renal failure) (HCC) 01/05/2015   Closed nondisplaced fracture of greater trochanter of right femur with routine healing 11/23/2014   Alcohol withdrawal (HCC) 11/21/2014   Acute confusional state 11/21/2014   Sepsis (HCC) 05/25/2014   Fever 05/25/2014   Malnutrition of moderate degree (HCC) 05/25/2014   Alcohol abuse    Smoker 12/15/2013   Acute respiratory failure (HCC) 10/28/2013   Delirium tremens (HCC) 10/28/2013   Acute respiratory distress syndrome (ARDS) (HCC) 10/28/2013   Systolic CHF (HCC) 10/27/2013   Tobacco abuse 10/27/2013   Weakness 08/01/2013   Dizziness 04/06/2013   Chronic alcohol abuse 02/16/2013   Liver cirrhosis, alcoholic (HCC) 02/16/2013   Depression (emotion) 02/16/2013   UTI (urinary tract infection) 01/16/2013   Hypokalemia 01/15/2013   Hypomagnesemia 01/15/2013   Fever, unspecified 01/15/2013   Leukopenia 01/13/2013   Orthostatic hypotension 01/13/2013   Alcohol intoxication (HCC) 01/12/2013   Syncope 01/12/2013   Lactic acidosis 01/12/2013   Cirrhosis (HCC) 01/12/2013   History of stroke 01/12/2013   Acute alcohol intoxication (HCC) 08/30/2012   Iron deficiency 02/04/2012   Hyponatremia 01/31/2012   COPD (chronic obstructive pulmonary disease) (HCC) 01/03/2012   PCP:  Raymon Mutton., FNP Pharmacy:   [facility] [address] [place], [place] - [address] [place] [address] Phone: [phone] Fax: [phone]     Social Determinants of Health (SDOH) Social History: SDOH Screenings   Food Insecurity: No Food Insecurity (01/08/2023)  Housing: Low Risk   (01/08/2023)  Transportation Needs: No Transportation Needs (01/08/2023)  Utilities: Not At Risk (01/08/2023)  Tobacco Use: High Risk (01/09/2023)   SDOH Interventions:     Readmission Risk Interventions     No data to display

## 2023-01-11 ENCOUNTER — Inpatient Hospital Stay (HOSPITAL_COMMUNITY): Payer: Medicare Other

## 2023-01-11 DIAGNOSIS — S72012A Unspecified intracapsular fracture of left femur, initial encounter for closed fracture: Secondary | ICD-10-CM | POA: Diagnosis not present

## 2023-01-11 LAB — CBC
HCT: 36.8 % — ABNORMAL LOW (ref 39.0–52.0)
Hemoglobin: 12.2 g/dL — ABNORMAL LOW (ref 13.0–17.0)
MCH: 24.8 pg — ABNORMAL LOW (ref 26.0–34.0)
MCHC: 33.2 g/dL (ref 30.0–36.0)
MCV: 74.9 fL — ABNORMAL LOW (ref 80.0–100.0)
Platelets: 205 10*3/uL (ref 150–400)
RBC: 4.91 MIL/uL (ref 4.22–5.81)
RDW: 16.3 % — ABNORMAL HIGH (ref 11.5–15.5)
WBC: 15.2 10*3/uL — ABNORMAL HIGH (ref 4.0–10.5)
nRBC: 0 % (ref 0.0–0.2)

## 2023-01-11 LAB — GLUCOSE, CAPILLARY
Glucose-Capillary: 108 mg/dL — ABNORMAL HIGH (ref 70–99)
Glucose-Capillary: 101 mg/dL — ABNORMAL HIGH (ref 70–99)
Glucose-Capillary: 110 mg/dL — ABNORMAL HIGH (ref 70–99)
Glucose-Capillary: 95 mg/dL (ref 70–99)

## 2023-01-11 MED ORDER — SODIUM CHLORIDE 0.9 % IV SOLN: INTRAVENOUS | Status: DC

## 2023-01-11 MED ORDER — SODIUM CHLORIDE 0.9 % IV BOLUS
500.0000 mL | Freq: Once | INTRAVENOUS | Status: AC
  Administered 2023-01-11: 500 mL via INTRAVENOUS

## 2023-01-11 NOTE — Progress Notes (Signed)
Subjective: 2 Days Post-Op Procedure(s) (LRB): TOTAL HIP ARTHROPLASTY ANTERIOR APPROACH (Left) Patient reports pain as mild.    Objective: Vital signs in last 24 hours: Temp:  [98.3 F (36.8 C)-101.8 F (38.8 C)] 99.1 F (37.3 C) (07/07 0927) Pulse Rate:  [96-110] 104 (07/07 0927) Resp:  [16-18] 16 (07/07 0927) BP: (86-128)/(51-92) 103/64 (07/07 0927) SpO2:  [94 %-100 %] 95 % (07/07 0927)  Intake/Output from previous day: 07/06 0701 - 07/07 0700 In: -  Out: 1500 [Urine:1500] Intake/Output this shift: Total I/O In: 360 [P.O.:360] Out: 150 [Urine:150]  Recent Labs    01/09/23 0320 01/09/23 2216 01/10/23 0122 01/11/23 0344  HGB 11.9* 10.9* 10.2* 12.2*   Recent Labs    01/10/23 0122 01/11/23 0344  WBC 5.9 15.2*  RBC 4.27 4.91  HCT 31.9* 36.8*  PLT 317 205   Recent Labs    01/09/23 0320 01/09/23 2216 01/10/23 0122  NA 136  --  134*  K 4.5  --  4.2  CL 101  --  102  CO2 25  --  25  BUN 15  --  11  CREATININE 1.22 1.27* 1.07  GLUCOSE 110*  --  157*  CALCIUM 9.2  --  8.3*   Recent Labs    01/09/23 0320  INR 1.1    Neurologically intact Neurovascular intact Sensation intact distally Intact pulses distally Dorsiflexion/Plantar flexion intact Incision: dressing C/D/I No cellulitis present Compartment soft   Assessment/Plan: 2 Days Post-Op Procedure(s) (LRB): TOTAL HIP ARTHROPLASTY ANTERIOR APPROACH (Left) Weightbearing: WBAT LLE Insicional and dressing care: Dressings left intact until follow-up Orthopedic device(s): None Showering: pod#3 VTE prophylaxis: Lovenox 40mg  qd  x 2 weeks  and scds Pain control: percocet Follow - up plan: 2 weeks Contact information:  xu MD, Dub Mikes PA       Peter [name] 01/11/2023, 9:46 AM

## 2023-01-11 NOTE — Progress Notes (Signed)
Md notified of yellow mews T-102.0,HR-114, BP 99/57, O2 93% RA, RR-18 New orders see chart

## 2023-01-11 NOTE — Progress Notes (Signed)
Peter [name]  ZOX:[id number] DOB: 31-Jan-1951 DOA: 01/08/2023 PCP: Raymon Mutton., FNP    Brief Narrative:  72 year old with a history of alcoholism, tobacco abuse, COPD, DM2, HTN, HLD, cirrhosis of the liver, anxiety/depression, and CVA with chronic left-sided weakness who reported a mechanical fall landing on his left hip after which he experienced persistent left hip pain.  In the ER he was diagnosed with a left subcapital femoral neck fracture.  Goals of Care:  Code Status: Full Code   DVT prophylaxis: Lovenox  Interim Hx: Medically stable overnight with no acute events recorded.  Afebrile.  Vital signs stable.  CBG well-controlled. Concerns remain regarding his ability to care for himself at home, as is his desire. Delay d/c for today to allow PT/OT to continue to work w/ patient.   Assessment & Plan:  Displaced left femoral neck fracture status post mechanical fall Status post left total hip replacement 01/09/2023 by Dr. Roda Shutters -postoperative care and DVT prophylaxis per Orthopedics -the patient will be due for staple/suture removal 2 weeks postoperatively  Alcoholism Continue prophylactic low-dose benzo during admission - no withdrawal symptoms encountered  Alcoholic cirrhosis of the liver No acute issues at present  DM2 Diet controlled - CBG well-controlled at present - A1c 5.9 with patient not on home medical therapy  COPD Well compensated presently  History of CVA with chronic left-sided weakness  Family Communication: No family present at time of exam Disposition: To be determined by PT/OT evaluations - pt desires d/c home but status currently marginal    Objective: Blood pressure 104/62, pulse (!) 108, temperature 98.5 F (36.9 C), resp. rate 16, height 5' 7.5" (1.715 m), weight 56.7 kg, SpO2 94 %.  Intake/Output Summary (Last 24 hours) at 01/11/2023 0821 Last data filed at 01/11/2023 0816 Gross per 24 hour  Intake 360 ml  Output 1500 ml  Net -1140 ml    Filed  Weights   01/08/23 0643 01/09/23 1258  Weight: 57.2 kg 56.7 kg    Examination: General: No acute respiratory distress Lungs: Clear to auscultation bilaterally  Cardiovascular: RRR Abdomen: Nontender, nondistended, soft, bowel sounds positive, no rebound, no ascites, no appreciable mass Extremities: No significant edema BLE  CBC: Recent Labs  Lab 01/08/23 0942 01/09/23 0320 01/09/23 2216 01/10/23 0122 01/11/23 0344  WBC 6.1   < > 7.1 5.9 15.2*  NEUTROABS 3.8  --   --  4.6  --   HGB 11.9*   < > 10.9* 10.2* 12.2*  HCT 37.7*   < > 34.1* 31.9* 36.8*  MCV 76.9*   < > 74.8* 74.7* 74.9*  PLT 389   < > 345 317 205   < > = values in this interval not displayed.    Basic Metabolic Panel: Recent Labs  Lab 01/08/23 0942 01/09/23 0320 01/09/23 2216 01/10/23 0122  NA 134* 136  --  134*  K 4.0 4.5  --  4.2  CL 100 101  --  102  CO2 22 25  --  25  GLUCOSE 70 110*  --  157*  BUN 13 15  --  11  CREATININE 1.10 1.22 1.27* 1.07  CALCIUM 8.9 9.2  --  8.3*    GFR: Estimated Creatinine Clearance: 52.3 mL/min (by C-G formula based on SCr of 1.07 mg/dL).   Scheduled Meds:  atorvastatin  40 mg Oral QHS   diazepam  5 mg Oral BID   docusate sodium  100 mg Oral BID   enoxaparin (LOVENOX) injection  40 mg Subcutaneous Q24H   finasteride  5 mg Oral Daily   insulin aspart  0-9 Units Subcutaneous TID WC   mirtazapine  15 mg Oral QHS   montelukast  10 mg Oral QHS   senna-docusate  1 tablet Oral BID   tamsulosin  0.4 mg Oral Daily   thiamine  100 mg Oral Daily   Continuous Infusions:  methocarbamol (ROBAXIN) IV       LOS: 3 days   Lonia Blood, MD [facility] Office  [phone] Pager - Text Page per Loretha Stapler  If 7PM-7AM, please contact night-coverage per Amion 01/11/2023, 8:21 AM

## 2023-01-12 ENCOUNTER — Encounter (HOSPITAL_COMMUNITY): Payer: Self-pay | Admitting: Orthopaedic Surgery

## 2023-01-12 DIAGNOSIS — S72012A Unspecified intracapsular fracture of left femur, initial encounter for closed fracture: Secondary | ICD-10-CM | POA: Diagnosis not present

## 2023-01-12 LAB — GLUCOSE, CAPILLARY
Glucose-Capillary: 85 mg/dL (ref 70–99)
Glucose-Capillary: 64 mg/dL — ABNORMAL LOW (ref 70–99)
Glucose-Capillary: 84 mg/dL (ref 70–99)
Glucose-Capillary: 94 mg/dL (ref 70–99)
Glucose-Capillary: 97 mg/dL (ref 70–99)

## 2023-01-12 LAB — CBC
HCT: 30.2 % — ABNORMAL LOW (ref 39.0–52.0)
Hemoglobin: 9.7 g/dL — ABNORMAL LOW (ref 13.0–17.0)
MCH: 24 pg — ABNORMAL LOW (ref 26.0–34.0)
MCHC: 32.1 g/dL (ref 30.0–36.0)
MCV: 74.6 fL — ABNORMAL LOW (ref 80.0–100.0)
Platelets: 266 10*3/uL (ref 150–400)
RBC: 4.05 MIL/uL — ABNORMAL LOW (ref 4.22–5.81)
RDW: 16.1 % — ABNORMAL HIGH (ref 11.5–15.5)
WBC: 10.4 10*3/uL (ref 4.0–10.5)
nRBC: 0 % (ref 0.0–0.2)

## 2023-01-12 LAB — COMPREHENSIVE METABOLIC PANEL
ALT: 9 U/L (ref 0–44)
AST: 20 U/L (ref 15–41)
Albumin: 2.4 g/dL — ABNORMAL LOW (ref 3.5–5.0)
Alkaline Phosphatase: 44 U/L (ref 38–126)
Anion gap: 5 (ref 5–15)
BUN: 14 mg/dL (ref 8–23)
CO2: 23 mmol/L (ref 22–32)
Calcium: 8.2 mg/dL — ABNORMAL LOW (ref 8.9–10.3)
Chloride: 108 mmol/L (ref 98–111)
Creatinine, Ser: 0.96 mg/dL (ref 0.61–1.24)
GFR, Estimated: >60 mL/min (ref >60)
Glucose, Bld: 108 mg/dL — ABNORMAL HIGH (ref 70–99)
Potassium: 3.7 mmol/L (ref 3.5–5.1)
Sodium: 136 mmol/L (ref 135–145)
Total Bilirubin: 0.4 mg/dL (ref 0.3–1.2)
Total Protein: 5.6 g/dL — ABNORMAL LOW (ref 6.5–8.1)

## 2023-01-12 MED ORDER — SODIUM CHLORIDE 0.9 % IV BOLUS
500.0000 mL | Freq: Once | INTRAVENOUS | Status: AC
  Administered 2023-01-12: 500 mL via INTRAVENOUS

## 2023-01-12 NOTE — Progress Notes (Signed)
Occupational Therapy Treatment Patient Details Name: Peter [name] MRN: [id number] DOB: [birth year] Today's Date: 01/12/2023   History of present illness The pt is a 72 yo male presenting 7/4 after feeling a pop in his L hip while walking, pt reports unable to ambulate after the pop. Pt found to have femoral neck fx, now s/p L THA 7/5. PMH includes: anemia, anxiety, cirrhosis of liver, DM II, ETOH abuse, emphysema, HLD, HTN, and stroke with L-sided weakness.   OT comments  Patient with fair progress toward patient focused goals.  Patient largely supervision for in room mobility, toileting and stand grooming at 4WRW level.  Able to reach his L foot now, and needing supervision for lower body ADL.  OT can continue efforts in the acute setting to address remaining deficits, and no post acute OT is anticipated.     Recommendations for follow up therapy are one component of a multi-disciplinary discharge planning process, led by the attending physician.  Recommendations may be updated based on patient status, additional functional criteria and insurance authorization.    Assistance Recommended at Discharge Set up Supervision/Assistance  Patient can return home with the following  A little help with walking and/or transfers;A little help with bathing/dressing/bathroom;Assistance with cooking/housework;Assist for transportation;Help with stairs or ramp for entrance   Equipment Recommendations  BSC/3in1    Recommendations for Other Services      Precautions / Restrictions Precautions Precautions: Fall Restrictions Weight Bearing Restrictions: Yes LLE Weight Bearing: Weight bearing as tolerated       Mobility Bed Mobility Overal bed mobility: Modified Independent                  Transfers Overall transfer level: Needs assistance Equipment used: Rolling walker (2 wheels) Transfers: Sit to/from Stand, Bed to chair/wheelchair/BSC Sit to Stand: Supervision     Step pivot  transfers: Supervision           Balance Overall balance assessment: Needs assistance Sitting-balance support: No upper extremity supported, Feet supported Sitting balance-Leahy Scale: Good     Standing balance support: Reliant on assistive device for balance Standing balance-Leahy Scale: Fair                             ADL either performed or assessed with clinical judgement   ADL       Grooming: Supervision/safety;Standing               Lower Body Dressing: Supervision/safety;Sit to/from stand   Toilet Transfer: Supervision/safety;Rolling walker (2 wheels)                  Extremity/Trunk Assessment Upper Extremity Assessment Upper Extremity Assessment: Overall WFL for tasks assessed   Lower Extremity Assessment Lower Extremity Assessment: Defer to PT evaluation        Vision Baseline Vision/History: 1 Wears glasses Patient Visual Report: No change from baseline     Perception Perception Perception: Within Functional Limits   Praxis Praxis Praxis: Intact    Cognition Arousal/Alertness: Awake/alert Behavior During Therapy: Flat affect Overall Cognitive Status: Within Functional Limits for tasks assessed                                                             Pertinent Vitals/  Pain       Pain Assessment Pain Assessment: Faces Faces Pain Scale: Hurts a little bit Pain Location: R hip and knee Pain Descriptors / Indicators: Tender Pain Intervention(s): Monitored during session                                                          Frequency  Min 2X/week        Progress Toward Goals  OT Goals(current goals can now be found in the care plan section)  Progress towards OT goals: Progressing toward goals  Acute Rehab OT Goals OT Goal Formulation: With patient Time For Goal Achievement: 01/24/23 Potential to Achieve Goals: Good  Plan Discharge plan remains  appropriate    Co-evaluation                 AM-PAC OT "6 Clicks" Daily Activity     Outcome Measure   Help from another person eating meals?: None Help from another person taking care of personal grooming?: None Help from another person toileting, which includes using toliet, bedpan, or urinal?: A Little Help from another person bathing (including washing, rinsing, drying)?: A Little Help from another person to put on and taking off regular upper body clothing?: None Help from another person to put on and taking off regular lower body clothing?: A Little 6 Click Score: 21    End of Session Equipment Utilized During Treatment: Gait belt;Rolling walker (2 wheels)  OT Visit Diagnosis: Unsteadiness on feet (R26.81);Other abnormalities of gait and mobility (R26.89);Muscle weakness (generalized) (M62.81);Pain Pain - Right/Left: Left   Activity Tolerance Patient tolerated treatment well   Patient Left in bed;with call bell/phone within reach;with nursing/sitter in room;with family/visitor present   Nurse Communication Mobility status        Time: 4098-1191 OT Time Calculation (min): 21 min  Charges: OT General Charges $OT Visit: 1 Visit OT Treatments $Self Care/Home Management : 8-22 mins  01/12/2023  RP, OTR/L  Acute Rehabilitation Services  Office:  [phone]   Suzanna Obey 01/12/2023, 1:39 PM

## 2023-01-12 NOTE — Plan of Care (Signed)
  Problem: Activity: Goal: Risk for activity intolerance will decrease Outcome: Progressing   Problem: Nutrition: Goal: Adequate nutrition will be maintained Outcome: Progressing   Problem: Coping: Goal: Level of anxiety will decrease Outcome: Progressing   Problem: Pain Managment: Goal: General experience of comfort will improve Outcome: Progressing   

## 2023-01-12 NOTE — Progress Notes (Signed)
Peter [name]  WGN:[id number] DOB: 1950/11/14 DOA: 01/08/2023 PCP: Raymon Mutton., FNP    Brief Narrative:  72 year old with a history of alcoholism, tobacco abuse, COPD, DM2, HTN, HLD, cirrhosis of the liver, anxiety/depression, and CVA with chronic left-sided weakness who reported a mechanical fall landing on his left hip after which he experienced persistent left hip pain.  In the ER he was diagnosed with a left subcapital femoral neck fracture.  Goals of Care:  Code Status: Full Code   DVT prophylaxis: Lovenox  Interim Hx: The patient had a fever to 102 yesterday afternoon around 4 PM.  A CXR was obtained and was unrevealing.  The patient was gently hydrated and given Tylenol.  Otherwise no specific intervention was utilized.  He has remained afebrile since that time.  His WBC has actually normalized.  His hemoglobin has declined significantly, coincident with hydration.  He is sitting up in a bedside chair with no complaints.  He has a good appetite.  He reports some modest pain in his hip but no other focal complaints.  Denies cough fevers or chills.  Assessment & Plan:  Displaced left femoral neck fracture status post mechanical fall Status post left total hip replacement 01/09/2023 by Dr. Roda Shutters -postoperative care and DVT prophylaxis per Orthopedics -the patient will be due for staple/suture removal 2 weeks postoperatively  Fever of unknown origin Tmax 102 at 4 PM 01/11/2023 - CXR unrevealing to my review - no clear source at present - keep off antibiotics for now -monitor -surgical wound unremarkable -no recurrence thus far  Alcoholism Continue prophylactic low-dose benzo during admission - no withdrawal symptoms encountered  Alcoholic cirrhosis of the liver No acute issues at present  DM2 Diet controlled - CBG well-controlled at present - A1c 5.9 with patient not on home medical therapy  COPD Well compensated presently  History of CVA with chronic left-sided  weakness  Family Communication: No family present at time of exam Disposition: To be determined by PT/OT evaluations - pt desires d/c home but status currently marginal    Objective: Blood pressure 118/62, pulse 91, temperature 98.9 F (37.2 C), temperature source Oral, resp. rate 17, height 5' 7.5" (1.715 m), weight 56.7 kg, SpO2 100 %.  Intake/Output Summary (Last 24 hours) at 01/12/2023 0828 Last data filed at 01/12/2023 0700 Gross per 24 hour  Intake 480 ml  Output 1200 ml  Net -720 ml    Filed Weights   01/08/23 0643 01/09/23 1258  Weight: 57.2 kg 56.7 kg    Examination: General: No acute respiratory distress Lungs: Clear to auscultation bilaterally  Cardiovascular: RRR Abdomen: Nontender, nondistended, soft, bowel sounds positive, no rebound, no ascites, no appreciable mass Extremities: No significant edema BLE -left hip surgical wound dressed and dry with no erythema or significant discharge  CBC: Recent Labs  Lab 01/08/23 0942 01/09/23 0320 01/10/23 0122 01/11/23 0344 01/12/23 0442  WBC 6.1   < > 5.9 15.2* 10.4  NEUTROABS 3.8  --  4.6  --   --   HGB 11.9*   < > 10.2* 12.2* 9.7*  HCT 37.7*   < > 31.9* 36.8* 30.2*  MCV 76.9*   < > 74.7* 74.9* 74.6*  PLT 389   < > 317 205 266   < > = values in this interval not displayed.    Basic Metabolic Panel: Recent Labs  Lab 01/09/23 0320 01/09/23 2216 01/10/23 0122 01/12/23 0442  NA 136  --  134* 136  K 4.5  --  4.2 3.7  CL 101  --  102 108  CO2 25  --  25 23  GLUCOSE 110*  --  157* 108*  BUN 15  --  11 14  CREATININE 1.22 1.27* 1.07 0.96  CALCIUM 9.2  --  8.3* 8.2*    GFR: Estimated Creatinine Clearance: 58.2 mL/min (by C-G formula based on SCr of 0.96 mg/dL).   Scheduled Meds:  atorvastatin  40 mg Oral QHS   diazepam  5 mg Oral BID   docusate sodium  100 mg Oral BID   enoxaparin (LOVENOX) injection  40 mg Subcutaneous Q24H   finasteride  5 mg Oral Daily   insulin aspart  0-9 Units Subcutaneous TID WC    mirtazapine  15 mg Oral QHS   montelukast  10 mg Oral QHS   senna-docusate  1 tablet Oral BID   tamsulosin  0.4 mg Oral Daily   thiamine  100 mg Oral Daily   Continuous Infusions:  sodium chloride 100 mL/hr at 01/11/23 1718   methocarbamol (ROBAXIN) IV       LOS: 4 days   Lonia Blood, MD [facility] Office  [phone] Pager - Text Page per Loretha Stapler  If 7PM-7AM, please contact night-coverage per Amion 01/12/2023, 8:28 AM

## 2023-01-12 NOTE — Progress Notes (Signed)
MD notified of elevated Heart Rate  01/12/23 1648  Vitals  Temp 99.6 F (37.6 C)  Temp Source Oral  BP 98/61  MAP (mmHg) 73  BP Location Left Arm  BP Method Automatic  Patient Position (if appropriate) Lying  Pulse Rate (!) 121  Pulse Rate Source Dinamap  Resp 17  Level of Consciousness  Level of Consciousness Alert  MEWS COLOR  MEWS Score Color Yellow  Oxygen Therapy  SpO2 98 %  O2 Device Room Air  Pain Assessment  Pain Scale 0-10  Pain Score 0  MEWS Score  MEWS Temp 0  MEWS Systolic 1  MEWS Pulse 2  MEWS RR 0  MEWS LOC 0  MEWS Score 3

## 2023-01-12 NOTE — Progress Notes (Signed)
Physical Therapy Treatment Patient Details Name: Peter [name] MRN: [id number] DOB: 1950/12/07 Today's Date: 01/12/2023   History of Present Illness The pt is a 72 yo male presenting 7/4 after feeling a pop in his L hip while walking, pt reports unable to ambulate after the pop. Pt found to have femoral neck fx, now s/p L THA 7/5. PMH includes: anemia, anxiety, cirrhosis of liver, DM II, ETOH abuse, emphysema, HLD, HTN, and stroke with L-sided weakness.    PT Comments  The pt was agreeable to session, able to demo good progress and good stability with use of rollator. Continues to benefit from cues for gait mechanics (step-through pattern with increased heel strike and upright posture). The pt was able to safely demo use of seat on rollator and good understanding of safety features of rollator. Will continue to benefit from skilled PT acutely to progress independence with transfers and allow for safest return home. Pt continues to endorse that he will have increased supervision and assist available at home.     Assistance Recommended at Discharge Frequent or constant Supervision/Assistance  If plan is discharge home, recommend the following:  Can travel by private vehicle    A little help with walking and/or transfers;A little help with bathing/dressing/bathroom;Assistance with cooking/housework;Direct supervision/assist for financial management;Assist for transportation;Help with stairs or ramp for entrance      Equipment Recommendations  Rollator (4 wheels)    Recommendations for Other Services       Precautions / Restrictions Precautions Precautions: Fall Restrictions Weight Bearing Restrictions: Yes LLE Weight Bearing: Weight bearing as tolerated     Mobility  Bed Mobility Overal bed mobility: Modified Independent             General bed mobility comments: increased time and use of bed rail, no physical assist    Transfers Overall transfer level: Needs  assistance Equipment used: Rolling walker (2 wheels) Transfers: Sit to/from Stand, Bed to chair/wheelchair/BSC Sit to Stand: Supervision   Step pivot transfers: Supervision       General transfer comment: education on safety features of rollator, pt able to complete without assist. slow to rise due to pain in L hip    Ambulation/Gait Ambulation/Gait assistance: Min guard Gait Distance (Feet): 150 Feet Assistive device: Rollator (4 wheels) Gait Pattern/deviations: Step-to pattern, Step-through pattern, Decreased stance time - left, Decreased weight shift to left, Knee flexed in stance - left, Antalgic Gait velocity: decreased Gait velocity interpretation: <1.31 ft/sec, indicative of household ambulator   General Gait Details: small steps with progression to slight step-through pattern. decreased knee ext with LLE no overt buckling      Balance Overall balance assessment: Needs assistance Sitting-balance support: No upper extremity supported, Feet supported Sitting balance-Leahy Scale: Good Sitting balance - Comments: good, able to reach outside of BOS on EOB   Standing balance support: Bilateral upper extremity supported, During functional activity Standing balance-Leahy Scale: Fair Standing balance comment: static stance with single UE support, no overt LOB                            Cognition Arousal/Alertness: Awake/alert Behavior During Therapy: WFL for tasks assessed/performed Overall Cognitive Status: Within Functional Limits for tasks assessed                                 General Comments: pt able to answer all questions, did not  call out when he spilled urinal to ask for help, but otherwise was able to express needs in session and demos good ability to recall cues for safety        Exercises      General Comments General comments (skin integrity, edema, etc.): VSS on RA, BP stable and slightly elevated after activity       Pertinent Vitals/Pain Pain Assessment Pain Assessment: 0-10 Pain Score: 8  Pain Location: R hip and knee Pain Descriptors / Indicators: Burning, Aching, Sharp Pain Intervention(s): Limited activity within patient's tolerance, Monitored during session, Repositioned, RN gave pain meds during session     PT Goals (current goals can now be found in the care plan section) Acute Rehab PT Goals Patient Stated Goal: return home PT Goal Formulation: With patient Time For Goal Achievement: 01/24/23 Potential to Achieve Goals: Good Progress towards PT goals: Progressing toward goals    Frequency    Min 3X/week      PT Plan Current plan remains appropriate       AM-PAC PT "6 Clicks" Mobility   Outcome Measure  Help needed turning from your back to your side while in a flat bed without using bedrails?: A Little Help needed moving from lying on your back to sitting on the side of a flat bed without using bedrails?: A Little Help needed moving to and from a bed to a chair (including a wheelchair)?: A Little Help needed standing up from a chair using your arms (e.g., wheelchair or bedside chair)?: A Little Help needed to walk in hospital room?: A Little Help needed climbing 3-5 steps with a railing? : A Little 6 Click Score: 18    End of Session Equipment Utilized During Treatment: Gait belt Activity Tolerance: Patient tolerated treatment well Patient left: in chair;with call bell/phone within reach;with chair alarm set Nurse Communication: Mobility status PT Visit Diagnosis: Unsteadiness on feet (R26.81);Muscle weakness (generalized) (M62.81);Pain Pain - Right/Left: Left Pain - part of body: Hip;Leg     Time: 1610-9604 PT Time Calculation (min) (ACUTE ONLY): 31 min  Charges:    $Gait Training: 8-22 mins $Therapeutic Activity: 8-22 mins PT General Charges $$ ACUTE PT VISIT: 1 Visit                     Vickki Muff, PT, DPT   Acute Rehabilitation Department Office  [phone] Secure Chat Communication Preferred   Ronnie Derby 01/12/2023, 9:14 AM

## 2023-01-12 NOTE — Care Management Important Message (Signed)
Important Message  Patient Details  Name: BREON ENZ MRN: [id number] Date of Birth: Nov 09, 1950   Medicare Important Message Given:  Yes     Sherilyn Banker 01/12/2023, 3:38 PM

## 2023-01-13 ENCOUNTER — Inpatient Hospital Stay (HOSPITAL_COMMUNITY): Payer: Medicare Other

## 2023-01-13 DIAGNOSIS — S72012A Unspecified intracapsular fracture of left femur, initial encounter for closed fracture: Secondary | ICD-10-CM | POA: Diagnosis not present

## 2023-01-13 LAB — BASIC METABOLIC PANEL
Anion gap: 8 (ref 5–15)
BUN: 16 mg/dL (ref 8–23)
CO2: 22 mmol/L (ref 22–32)
Calcium: 8.7 mg/dL — ABNORMAL LOW (ref 8.9–10.3)
Chloride: 105 mmol/L (ref 98–111)
Creatinine, Ser: 0.89 mg/dL (ref 0.61–1.24)
GFR, Estimated: >60 mL/min (ref >60)
Glucose, Bld: 100 mg/dL — ABNORMAL HIGH (ref 70–99)
Potassium: 4.1 mmol/L (ref 3.5–5.1)
Sodium: 135 mmol/L (ref 135–145)

## 2023-01-13 LAB — CBC
HCT: 29.7 % — ABNORMAL LOW (ref 39.0–52.0)
Hemoglobin: 9.7 g/dL — ABNORMAL LOW (ref 13.0–17.0)
MCH: 24 pg — ABNORMAL LOW (ref 26.0–34.0)
MCHC: 32.7 g/dL (ref 30.0–36.0)
MCV: 73.3 fL — ABNORMAL LOW (ref 80.0–100.0)
Platelets: 306 10*3/uL (ref 150–400)
RBC: 4.05 MIL/uL — ABNORMAL LOW (ref 4.22–5.81)
RDW: 16 % — ABNORMAL HIGH (ref 11.5–15.5)
WBC: 7.8 10*3/uL (ref 4.0–10.5)
nRBC: 0 % (ref 0.0–0.2)

## 2023-01-13 LAB — GLUCOSE, CAPILLARY
Glucose-Capillary: 107 mg/dL — ABNORMAL HIGH (ref 70–99)
Glucose-Capillary: 87 mg/dL (ref 70–99)
Glucose-Capillary: 90 mg/dL (ref 70–99)
Glucose-Capillary: 92 mg/dL (ref 70–99)
Glucose-Capillary: 126 mg/dL — ABNORMAL HIGH (ref 70–99)

## 2023-01-13 MED ORDER — IOHEXOL 350 MG/ML SOLN
75.0000 mL | Freq: Once | INTRAVENOUS | Status: AC | PRN
  Administered 2023-01-13: 75 mL via INTRAVENOUS

## 2023-01-13 NOTE — Plan of Care (Signed)
  Problem: Education: Goal: Knowledge of General Education information will improve Description: Including pain rating scale, medication(s)/side effects and non-pharmacologic comfort measures Outcome: Progressing   Problem: Activity: Goal: Risk for activity intolerance will decrease Outcome: Progressing   Problem: Nutrition: Goal: Adequate nutrition will be maintained Outcome: Progressing   Problem: Coping: Goal: Level of anxiety will decrease Outcome: Progressing   

## 2023-01-13 NOTE — Progress Notes (Signed)
Mobility Specialist Progress Note:    01/13/23 1153  Mobility  Activity Ambulated with assistance in hallway  Level of Assistance Contact guard assist, steadying assist  Assistive Device Four wheel walker  Distance Ambulated (ft) 150 ft  LLE Weight Bearing WBAT  Activity Response Tolerated well  Mobility Referral Yes  $Mobility charge 1 Mobility  Mobility Specialist Start Time (ACUTE ONLY) 1045  Mobility Specialist Stop Time (ACUTE ONLY) 1105  Mobility Specialist Time Calculation (min) (ACUTE ONLY) 20 min   Pt received in bed, agreeable to ambulate. Pt needed no physical assistance throughout session. Upon returning to room pt experienced an episode of urinary incontinence, requesting to use the BR, void complete. Pt assisted to chair to change gown and socks. RN in room, all needs met.  Thompson Grayer Mobility Specialist  Please contact vis Secure Chat or  Rehab Office [phone]

## 2023-01-13 NOTE — Progress Notes (Signed)
Patient evaluated at bedside today. Had a fever of 102.9 today.   Examination of the left hip shows a dry surgical bandage. He has decent movement of the hip without any significant pain. His thigh is soft without any evidence of active bleeding. Fever is likely secondary to recent surgery. Recommend aggressive incident spirometer, and pulmonary toilet.

## 2023-01-13 NOTE — Plan of Care (Signed)
?  Problem: Health Behavior/Discharge Planning: ?Goal: Ability to manage health-related needs will improve ?Outcome: Progressing ?  ?Problem: Activity: ?Goal: Risk for activity intolerance will decrease ?Outcome: Progressing ?  ?Problem: Elimination: ?Goal: Will not experience complications related to bowel motility ?Outcome: Progressing ?  ?

## 2023-01-13 NOTE — Progress Notes (Addendum)
Peter [name]  VQQ:[id number] DOB: 01-27-1951 DOA: 01/08/2023 PCP: Raymon Mutton., FNP    Brief Narrative:  72 year old with a history of alcoholism, tobacco abuse, COPD, DM2, HTN, HLD, cirrhosis of the liver, anxiety/depression, and CVA with chronic left-sided weakness who reported a mechanical fall landing on his left hip after which he experienced persistent left hip pain.  In the ER he was diagnosed with a left subcapital femoral neck fracture.  Goals of Care:  Code Status: Full Code   DVT prophylaxis: Lovenox  Interim Hx: The patient suffered a recurrent episode of elevated temperature yesterday afternoon, this time only up to 100.8.  Nonetheless since that time his temperature has remained in the 99-100 range.  WBC remains normal.  Vital signs are otherwise stable with exception to low-grade tachycardia.  He reports a moderate amount of ongoing pain, but not anything beyond what would be expected for his clinical course.  He denies chest pain or shortness of breath.  Overall he appears to be doing well.  Assessment & Plan:  Displaced left femoral neck fracture status post mechanical fall Status post left total hip replacement 01/09/2023 by Dr. Roda Shutters - postoperative care and DVT prophylaxis per Orthopedics - the patient will be due for staple/suture removal 2 weeks postoperatively - CT of thigh/hip ordered for today to evaluate persisting low grade fever and rule out perioperative complication   Fever of unknown origin Intermittent fevers up to 102 persist - denies urinary sx - CXR unrevealing with no clinical evidence to suggest pneumonia - no clear source at present - keep off antibiotics for now -monitor -surgical wound unremarkable -CT left femur today   Alcoholism Continue prophylactic low-dose benzo during admission - no withdrawal symptoms encountered thus far   Alcoholic cirrhosis of the liver No acute issues at present  DM2 Diet controlled - CBG well-controlled at present -  A1c 5.9 with patient not on home medical therapy  COPD Well compensated presently  History of CVA with chronic left-sided weakness  Family Communication: Spoke with family at bedside Disposition: Patient desires discharge home and has thus far done well with PT/OT -anticipate discharge home once FUO workup completed   Objective: Blood pressure 114/67, pulse (!) 110, temperature 99.7 F (37.6 C), resp. rate 18, height 5' 7.5" (1.715 m), weight 56.7 kg, SpO2 99 %.  Intake/Output Summary (Last 24 hours) at 01/13/2023 0909 Last data filed at 01/13/2023 0400 Gross per 24 hour  Intake 687 ml  Output 400 ml  Net 287 ml    Filed Weights   01/08/23 0643 01/09/23 1258  Weight: 57.2 kg 56.7 kg    Examination: General: No acute respiratory distress Lungs: Clear to auscultation bilaterally  Cardiovascular: RRR Abdomen: Nontender, nondistended, soft, bowel sounds positive, no rebound, no ascites, no appreciable mass Extremities: No significant edema BLE -left hip surgical wound dressed and dry with no erythema or significant discharge -some swelling to the posterior thigh with no fluctuance or discharge  CBC: Recent Labs  Lab 01/08/23 0942 01/09/23 0320 01/10/23 0122 01/11/23 0344 01/12/23 0442 01/13/23 0331  WBC 6.1   < > 5.9 15.2* 10.4 7.8  NEUTROABS 3.8  --  4.6  --   --   --   HGB 11.9*   < > 10.2* 12.2* 9.7* 9.7*  HCT 37.7*   < > 31.9* 36.8* 30.2* 29.7*  MCV 76.9*   < > 74.7* 74.9* 74.6* 73.3*  PLT 389   < > 317 205 266 306   < > =  values in this interval not displayed.    Basic Metabolic Panel: Recent Labs  Lab 01/10/23 0122 01/12/23 0442 01/13/23 0331  NA 134* 136 135  K 4.2 3.7 4.1  CL 102 108 105  CO2 25 23 22   GLUCOSE 157* 108* 100*  BUN 11 14 16   CREATININE 1.07 0.96 0.89  CALCIUM 8.3* 8.2* 8.7*    GFR: Estimated Creatinine Clearance: 62.8 mL/min (by C-G formula based on SCr of 0.89 mg/dL).   Scheduled Meds:  atorvastatin  40 mg Oral QHS   diazepam   5 mg Oral BID   docusate sodium  100 mg Oral BID   enoxaparin (LOVENOX) injection  40 mg Subcutaneous Q24H   finasteride  5 mg Oral Daily   insulin aspart  0-9 Units Subcutaneous TID WC   mirtazapine  15 mg Oral QHS   montelukast  10 mg Oral QHS   senna-docusate  1 tablet Oral BID   tamsulosin  0.4 mg Oral Daily   thiamine  100 mg Oral Daily   Continuous Infusions:  sodium chloride Stopped (01/12/23 1159)   methocarbamol (ROBAXIN) IV       LOS: 5 days   Lonia Blood, MD [facility] Office  [phone] Pager - Text Page per Amion  If 7PM-7AM, please contact night-coverage per Amion 01/13/2023, 9:09 AM

## 2023-01-14 DIAGNOSIS — S72002A Fracture of unspecified part of neck of left femur, initial encounter for closed fracture: Secondary | ICD-10-CM | POA: Diagnosis not present

## 2023-01-14 DIAGNOSIS — S72012A Unspecified intracapsular fracture of left femur, initial encounter for closed fracture: Secondary | ICD-10-CM | POA: Diagnosis not present

## 2023-01-14 LAB — GLUCOSE, CAPILLARY
Glucose-Capillary: 102 mg/dL — ABNORMAL HIGH (ref 70–99)
Glucose-Capillary: 110 mg/dL — ABNORMAL HIGH (ref 70–99)
Glucose-Capillary: 119 mg/dL — ABNORMAL HIGH (ref 70–99)
Glucose-Capillary: 111 mg/dL — ABNORMAL HIGH (ref 70–99)
Glucose-Capillary: 112 mg/dL — ABNORMAL HIGH (ref 70–99)

## 2023-01-14 LAB — CBC
HCT: 32.8 % — ABNORMAL LOW (ref 39.0–52.0)
Hemoglobin: 9.9 g/dL — ABNORMAL LOW (ref 13.0–17.0)
MCH: 23.5 pg — ABNORMAL LOW (ref 26.0–34.0)
MCHC: 30.2 g/dL (ref 30.0–36.0)
MCV: 77.9 fL — ABNORMAL LOW (ref 80.0–100.0)
Platelets: 307 10*3/uL (ref 150–400)
RBC: 4.21 MIL/uL — ABNORMAL LOW (ref 4.22–5.81)
RDW: 16.4 % — ABNORMAL HIGH (ref 11.5–15.5)
WBC: 4.7 10*3/uL (ref 4.0–10.5)
nRBC: 0 % (ref 0.0–0.2)

## 2023-01-14 MED ORDER — ADULT MULTIVITAMIN W/MINERALS CH
1.0000 | ORAL_TABLET | Freq: Every day | ORAL | Status: DC
  Administered 2023-01-14 – 2023-01-17 (×4): 1 via ORAL
  Filled 2023-01-14 (×4): qty 1

## 2023-01-14 MED ORDER — ENSURE ENLIVE PO LIQD
237.0000 mL | Freq: Three times a day (TID) | ORAL | Status: DC
  Administered 2023-01-14 – 2023-01-17 (×8): 237 mL via ORAL

## 2023-01-14 NOTE — Progress Notes (Signed)
Physical Therapy Treatment Patient Details Name: Peter [name] MRN: [id number] DOB: [birth year] Today's Date: 01/14/2023   History of Present Illness The pt is a 72 yo male presenting 7/4 after feeling a pop in his L hip while walking, pt reports unable to ambulate after the pop. Pt found to have femoral neck fx, now s/p L THA 7/5. PMH includes: anemia, anxiety, cirrhosis of liver, DM II, ETOH abuse, emphysema, HLD, HTN, and stroke with L-sided weakness.    PT Comments  Patient is agreeable to PT. Gait training progressed in hallway with 4 wheeled walker per patient preference. Occasional cues for improved gait kinematics. No standing rest breaks required and no shortness of breath is noted with activity. Recommend to continue PT to maximize independence and decrease caregiver burden.     Assistance Recommended at Discharge Frequent or constant Supervision/Assistance  If plan is discharge home, recommend the following:  Can travel by private vehicle    A little help with walking and/or transfers;A little help with bathing/dressing/bathroom;Assistance with cooking/housework;Direct supervision/assist for financial management;Assist for transportation;Help with stairs or ramp for entrance      Equipment Recommendations  Rollator (4 wheels)    Recommendations for Other Services       Precautions / Restrictions Precautions Precautions: Fall Restrictions Weight Bearing Restrictions: Yes LLE Weight Bearing: Weight bearing as tolerated     Mobility  Bed Mobility Overal bed mobility: Needs Assistance Bed Mobility: Sit to Supine       Sit to supine: Min guard   General bed mobility comments: increased time and effort required    Transfers Overall transfer level: Needs assistance Equipment used: Rolling walker (2 wheels) Transfers: Sit to/from Stand Sit to Stand: Supervision           General transfer comment: 2 standing bouts performed. verbal cues for safe hand placement  with standing from recliner chair and to avoid pulling up using the rollator for support. good safety awareness with locking/unlocking breaks on rollator    Ambulation/Gait Ambulation/Gait assistance: Min guard Gait Distance (Feet): 180 Feet Assistive device: Rollator (4 wheels) Gait Pattern/deviations: Step-through pattern, Decreased stance time - left, Decreased dorsiflexion - left, Trunk flexed, Antalgic Gait velocity: decreased     General Gait Details: cues for heel-toe gait pattern with increase in altalgic gait pattern noted. no rest breaks required with hallway ambulation. educated provided on how to properly use rollator seat if requried for seated rest breaks.   Stairs             Wheelchair Mobility     Tilt Bed    Modified Rankin (Stroke Patients Only)       Balance Overall balance assessment: Needs assistance Sitting-balance support: No upper extremity supported, Feet supported Sitting balance-Leahy Scale: Good     Standing balance support: Single extremity supported Standing balance-Leahy Scale: Fair Standing balance comment: patient able to stand at the toilet for urination with unilatearl support on the rollator without loss of balance. stand by assistance for safety                            Cognition Arousal/Alertness: Awake/alert Behavior During Therapy: Flat affect Overall Cognitive Status: Within Functional Limits for tasks assessed                                 General Comments: patient is able to follow all single  step commands without difficulty        Exercises      General Comments        Pertinent Vitals/Pain Pain Assessment Pain Assessment: 0-10 Pain Score: 2  Pain Location: L hip Pain Descriptors / Indicators: Tender Pain Intervention(s): Limited activity within patient's tolerance, Monitored during session, Repositioned    Home Living                          Prior Function             PT Goals (current goals can now be found in the care plan section) Acute Rehab PT Goals Patient Stated Goal: return home PT Goal Formulation: With patient Time For Goal Achievement: 01/24/23 Potential to Achieve Goals: Good Progress towards PT goals: Progressing toward goals    Frequency    Min 3X/week      PT Plan Current plan remains appropriate    Co-evaluation              AM-PAC PT "6 Clicks" Mobility   Outcome Measure  Help needed turning from your back to your side while in a flat bed without using bedrails?: A Little Help needed moving from lying on your back to sitting on the side of a flat bed without using bedrails?: A Little Help needed moving to and from a bed to a chair (including a wheelchair)?: A Little Help needed standing up from a chair using your arms (e.g., wheelchair or bedside chair)?: A Little Help needed to walk in hospital room?: A Little Help needed climbing 3-5 steps with a railing? : A Little 6 Click Score: 18    End of Session Equipment Utilized During Treatment: Gait belt Activity Tolerance: Patient tolerated treatment well Patient left: in bed;with bed alarm set Nurse Communication: Mobility status PT Visit Diagnosis: Unsteadiness on feet (R26.81);Muscle weakness (generalized) (M62.81);Pain Pain - Right/Left: Left Pain - part of body: Hip;Leg     Time: 1610-9604 PT Time Calculation (min) (ACUTE ONLY): 21 min  Charges:    $Gait Training: 8-22 mins PT General Charges $$ ACUTE PT VISIT: 1 Visit                     Donna Bernard, PT, MPT    Ina Homes 01/14/2023, 1:44 PM

## 2023-01-14 NOTE — Progress Notes (Signed)
Initial Nutrition Assessment  DOCUMENTATION CODES:   Severe malnutrition in context of chronic illness  INTERVENTION:  Liberalize diet to regular Ensure Enlive po TID, each supplement provides 350 kcal and 20 grams of protein. Nighttime snack MVI with minerals daily "High Calorie, High Protein Nutrition Therapy" handout added to AVS  NUTRITION DIAGNOSIS:   Severe Malnutrition related to chronic illness (COPD, cirrhosis, CVA) as evidenced by severe fat depletion, severe muscle depletion.  GOAL:   Patient will meet greater than or equal to 90% of their needs  MONITOR:   PO intake, Supplement acceptance, Labs, Weight trends, Diet advancement  REASON FOR ASSESSMENT:   Consult Assessment of nutrition requirement/status (Per RN request. Pt continues with low blood sugars.)  ASSESSMENT:   Pt admitted with L femoral neck fracture. PMH significant for alcoholism, tobacco use, COPD, T2DM, HTN, HLD, cirrhosis of the liver, anxiety/depression and CVA with chronic L sided weakness.  7/5- s/p L total hip replacement   Pt's nurse expresses concerns over low blood sugars. Mentions that his sister reported that he rides the bus to shop for groceries at home.   Pt resting in chair at time of visit. He mentions that he ate breakfast this morning, though nurse reports he requires encouragement to get up an eat. He states that at home he only eats when he is hungry which is on average about twice daily. He recalls eating foods such as fish, beans and peas. Does not consume protein supplements but "loves them." Pt denies having dinner last night. Review of meal completions reflects pt consuming about 2 meals per day. Discussed adding in small carbohydrate/protein containing snacks or protein supplements at home to prevent low blood sugar.  Pt observed to have poor dentition. He states that he is able to select foods with assistance of nursing that he can tolerate well. Denies much difficulty or  needing modified textures as he is able to break food down by grinding with the roof of his mouth.    Meal completions: 7/7: 100% breakfast, 75% dinner 7/8: 100% breakfast, 70% lunch 7/9: 100% breakfast  Pt reports that his weight has remained about 128 lbs and is not certain whether he has had any significant recent changes in weight. Reviewed weight history. Within the last 6 months, pt's weight has declined (5%) from 59.9 kg to 56.7 kg on admission.   Medications: colace, SSI 0-9 units TID, remeron, senna, thiamine  Labs: HgBA1c 5.9%, CBG's 87-126 x24 hours  NUTRITION - FOCUSED PHYSICAL EXAM:  Flowsheet Row Most Recent Value  Orbital Region Severe depletion  Upper Arm Region Severe depletion  Thoracic and Lumbar Region Severe depletion  Buccal Region Severe depletion  Temple Region Severe depletion  Clavicle Bone Region Severe depletion  Clavicle and Acromion Bone Region Severe depletion  Scapular Bone Region Severe depletion  Dorsal Hand Moderate depletion  Patellar Region Severe depletion  Anterior Thigh Region Severe depletion  Posterior Calf Region Moderate depletion  Edema (RD Assessment) None  Hair Reviewed  Eyes Reviewed  Mouth Other (Comment)  [poor dentition]  Skin Reviewed  Nails Reviewed       Diet Order:   Diet Order             Diet regular Room service appropriate? Yes with Assist; Fluid consistency: Thin  Diet effective now                   EDUCATION NEEDS:   Education needs have been addressed  Skin:  Skin Assessment:  Reviewed RN Assessment (L hip closed incision)  Last BM:  7/9  Height:   Ht Readings from Last 1 Encounters:  01/09/23 5' 7.5" (1.715 m)    Weight:   Wt Readings from Last 1 Encounters:  01/09/23 56.7 kg   BMI:  Body mass index is 19.29 kg/m.  Estimated Nutritional Needs:   Kcal:  1500-1700  Protein:  75-90g  Fluid:  >/=1.5L  Drusilla Kanner, RDN, LDN Clinical Nutrition

## 2023-01-14 NOTE — Hospital Course (Signed)
72 year old with a history of alcoholism, tobacco abuse, COPD, DM2, HTN, HLD, cirrhosis of the liver, anxiety/depression, and CVA with chronic left-sided weakness who reported a mechanical fall landing on his left hip after which he experienced persistent left hip pain. In the ER he was diagnosed with a left subcapital femoral neck fracture.

## 2023-01-14 NOTE — Progress Notes (Signed)
  Progress Note   Patient: Peter [name]:[id number] DOB: 21-Jan-1951 DOA: 01/08/2023     6 DOS: the patient was seen and examined on 01/14/2023   Brief hospital course: 72 year old with a history of alcoholism, tobacco abuse, COPD, DM2, HTN, HLD, cirrhosis of the liver, anxiety/depression, and CVA with chronic left-sided weakness who reported a mechanical fall landing on his left hip after which he experienced persistent left hip pain. In the ER he was diagnosed with a left subcapital femoral neck fracture.   Assessment and Plan: Displaced left femoral neck fracture status post mechanical fall Status post left total hip replacement 01/09/2023 by Dr. Roda Shutters - postoperative care and DVT prophylaxis per Orthopedics - the patient will be due for staple/suture removal 2 weeks postoperatively  -CT of thigh/hip reviewed, findings of some gas and fluid -Per Orthopedic Surgery, fever is likely secondary to recent surgery or could be from atelectasis. Recs for IS and pulm toilet   Fever of unknown origin Intermittent fevers up to 102 recently  -denies urinary sx - CXR unrevealing with no clinical evidence to suggest pneumonia - no clear source at present - keep off antibiotics for now  -Per ortho, fevers are likely related to being post-op   Alcoholism Continue prophylactic low-dose benzo during admission - no withdrawal symptoms encountered thus far    Alcoholic cirrhosis of the liver No acute issues at present   DM2 Diet controlled - CBG well-controlled at present - A1c 5.9 with patient not on home medical therapy   COPD Well compensated presently   History of CVA with chronic left-sided weakness   Subjective: Without complaints at this time  Physical Exam: Vitals:   01/13/23 2356 01/14/23 0448 01/14/23 0726 01/14/23 1417  BP: 124/69 119/79 (!) 107/59 103/68  Pulse: 98 100 (!) 105 96  Resp: 18 17 18 18   Temp: 98.4 F (36.9 C) 98.3 F (36.8 C) 99.1 F (37.3 C) 99.7 F (37.6 C)   TempSrc: Oral Oral    SpO2: 100% 100% 94% 97%  Weight:      Height:       General exam: Awake, laying in bed, in nad Respiratory system: Normal respiratory effort, no wheezing Cardiovascular system: regular rate, s1, s2 Gastrointestinal system: Soft, nondistended, positive BS Central nervous system: CN2-12 grossly intact, strength intact Extremities: Perfused, no clubbing Skin: Normal skin turgor, no notable skin lesions seen Psychiatry: Mood normal // no visual hallucinations   Data Reviewed:  Labs reviewed: WBC 4.7, Hgb 9.9, plts 307  Family Communication: Pt in room, family not at bedside  Disposition: Status is: Inpatient Remains inpatient appropriate because: Severity of illness  Planned Discharge Destination: Home    Author: Rickey Barbara, MD 01/14/2023 3:02 PM  For on call review [email or web address].

## 2023-01-14 NOTE — Plan of Care (Signed)
  Problem: Activity: Goal: Risk for activity intolerance will decrease Outcome: Progressing   Problem: Nutrition: Goal: Adequate nutrition will be maintained Outcome: Progressing   Problem: Safety: Goal: Ability to remain free from injury will improve Outcome: Progressing   

## 2023-01-15 DIAGNOSIS — S72012A Unspecified intracapsular fracture of left femur, initial encounter for closed fracture: Secondary | ICD-10-CM | POA: Diagnosis not present

## 2023-01-15 DIAGNOSIS — E43 Unspecified severe protein-calorie malnutrition: Secondary | ICD-10-CM | POA: Insufficient documentation

## 2023-01-15 DIAGNOSIS — S72002A Fracture of unspecified part of neck of left femur, initial encounter for closed fracture: Secondary | ICD-10-CM | POA: Diagnosis not present

## 2023-01-15 LAB — GLUCOSE, CAPILLARY
Glucose-Capillary: 107 mg/dL — ABNORMAL HIGH (ref 70–99)
Glucose-Capillary: 73 mg/dL (ref 70–99)
Glucose-Capillary: 114 mg/dL — ABNORMAL HIGH (ref 70–99)
Glucose-Capillary: 94 mg/dL (ref 70–99)
Glucose-Capillary: 115 mg/dL — ABNORMAL HIGH (ref 70–99)

## 2023-01-15 LAB — COMPREHENSIVE METABOLIC PANEL
ALT: 18 U/L (ref 0–44)
AST: 28 U/L (ref 15–41)
Albumin: 2.6 g/dL — ABNORMAL LOW (ref 3.5–5.0)
Alkaline Phosphatase: 57 U/L (ref 38–126)
Anion gap: 9 (ref 5–15)
BUN: 18 mg/dL (ref 8–23)
CO2: 26 mmol/L (ref 22–32)
Calcium: 9.4 mg/dL (ref 8.9–10.3)
Chloride: 103 mmol/L (ref 98–111)
Creatinine, Ser: 1.04 mg/dL (ref 0.61–1.24)
GFR, Estimated: >60 mL/min (ref >60)
Glucose, Bld: 111 mg/dL — ABNORMAL HIGH (ref 70–99)
Potassium: 4.7 mmol/L (ref 3.5–5.1)
Sodium: 138 mmol/L (ref 135–145)
Total Bilirubin: 0.4 mg/dL (ref 0.3–1.2)
Total Protein: 6.4 g/dL — ABNORMAL LOW (ref 6.5–8.1)

## 2023-01-15 LAB — CBC
HCT: 31.3 % — ABNORMAL LOW (ref 39.0–52.0)
Hemoglobin: 10.2 g/dL — ABNORMAL LOW (ref 13.0–17.0)
MCH: 24.6 pg — ABNORMAL LOW (ref 26.0–34.0)
MCHC: 32.6 g/dL (ref 30.0–36.0)
MCV: 75.6 fL — ABNORMAL LOW (ref 80.0–100.0)
Platelets: 334 10*3/uL (ref 150–400)
RBC: 4.14 MIL/uL — ABNORMAL LOW (ref 4.22–5.81)
RDW: 15.9 % — ABNORMAL HIGH (ref 11.5–15.5)
WBC: 4.7 10*3/uL (ref 4.0–10.5)
nRBC: 0 % (ref 0.0–0.2)

## 2023-01-15 LAB — SARS CORONAVIRUS 2 BY RT PCR: SARS Coronavirus 2 by RT PCR: NEGATIVE

## 2023-01-15 NOTE — Progress Notes (Signed)
  Progress Note   Patient: Peter [name] [id number] DOB: 11/30/1950 DOA: 01/08/2023     7 DOS: the patient was seen and examined on 01/15/2023   Brief hospital course: 72 year old with a history of alcoholism, tobacco abuse, COPD, DM2, HTN, HLD, cirrhosis of the liver, anxiety/depression, and CVA with chronic left-sided weakness who reported a mechanical fall landing on his left hip after which he experienced persistent left hip pain. In the ER he was diagnosed with a left subcapital femoral neck fracture.   Assessment and Plan: Displaced left femoral neck fracture status post mechanical fall Status post left total hip replacement 01/09/2023 by Dr. Roda Shutters - postoperative care and DVT prophylaxis per Orthopedics - the patient will be due for staple/suture removal 2 weeks postoperatively  -CT of thigh/hip reviewed, findings of some gas and fluid -Per Orthopedic Surgery, fever is likely secondary to recent surgery or could be from atelectasis. Recs for IS and pulm toilet   Fever of unknown origin Intermittent fevers noted, mainly in the evenings -denies urinary sx - CXR unrevealing with no clinical evidence to suggest pneumonia - holding off on antibiotics for now  -COVID neg -Blood cx pending -Per ortho, fevers are likely related to being post-op   Alcoholism Continue prophylactic low-dose benzo during admission - no withdrawal symptoms encountered thus far    Alcoholic cirrhosis of the liver No acute issues at present   DM2 Diet controlled - CBG well-controlled at present - A1c 5.9 with patient not on home medical therapy   COPD Well compensated presently   History of CVA with chronic left-sided weakness   Subjective: Reports feeling somewhat better this AM  Physical Exam: Vitals:   01/15/23 0402 01/15/23 0722 01/15/23 1400 01/15/23 1442  BP: 106/63 106/71 (!) 108/58 (!) 92/55  Pulse: 93 (!) 108  92  Resp: 18 18 (!) 26 (!) 24  Temp:  98.5 F (36.9 C) 98.8 F (37.1 C) 98.8 F  (37.1 C)  TempSrc:  Oral Oral Oral  SpO2: 100% 100% 99% 99%  Weight:      Height:       General exam: Conversant, in no acute distress Respiratory system: normal chest rise, clear, no audible wheezing Cardiovascular system: regular rhythm, s1-s2 Gastrointestinal system: Nondistended, nontender, pos BS Central nervous system: No seizures, no tremors Extremities: No cyanosis, no joint deformities Skin: No rashes, no pallor Psychiatry: Affect normal // no auditory hallucinations   Data Reviewed:  Labs reviewed: Na 138, K 4.7, Cr 1.04, Hgb 10.2  Family Communication: Pt in room, family not at bedside  Disposition: Status is: Inpatient Remains inpatient appropriate because: Severity of illness  Planned Discharge Destination: Home    Author: Rickey Barbara, MD 01/15/2023 5:29 PM  For on call review [email or web address].

## 2023-01-15 NOTE — Progress Notes (Signed)
Occupational Therapy Treatment Patient Details Name: LESLEY GALENTINE MRN: [id number] DOB: Oct 25, 1950 Today's Date: 01/15/2023   History of present illness The pt is a 72 yo male presenting 7/4 after feeling a pop in his L hip while walking, pt reports unable to ambulate after the pop. Pt found to have femoral neck fx, now s/p L THA 7/5. PMH includes: anemia, anxiety, cirrhosis of liver, DM II, ETOH abuse, emphysema, HLD, HTN, and stroke with L-sided weakness.   OT comments  Pt doing well, displays good safety awareness using 4WW for support. Pt able to lock brakes appropriately during each sit to stand, and when turning to sit on 4WW seat. Pt able to gather items from closet needed to complete LB dressing and transfer with 4WW with supervision, able to complete dressing mod I. Pt making good progress and is motivated to return home. Pt would benefit from continued skilled therapy to maximize progress and functional strength   Recommendations for follow up therapy are one component of a multi-disciplinary discharge planning process, led by the attending physician.  Recommendations may be updated based on patient status, additional functional criteria and insurance authorization.    Assistance Recommended at Discharge Set up Supervision/Assistance  Patient can return home with the following  A little help with walking and/or transfers;A little help with bathing/dressing/bathroom;Assist for transportation;Help with stairs or ramp for entrance   Equipment Recommendations  BSC/3in1    Recommendations for Other Services      Precautions / Restrictions Precautions Precautions: Fall Restrictions Weight Bearing Restrictions: Yes LLE Weight Bearing: Weight bearing as tolerated       Mobility Bed Mobility Overal bed mobility: Modified Independent             General bed mobility comments: increased time    Transfers Overall transfer level: Needs assistance Equipment used: Rollator (4  wheels) Transfers: Sit to/from Stand Sit to Stand: Supervision     Step pivot transfers: Supervision     General transfer comment: good safety awareness using RW, no verbal cues needed for locking brakes     Balance Overall balance assessment: Needs assistance Sitting-balance support: No upper extremity supported, Feet supported Sitting balance-Leahy Scale: Good Sitting balance - Comments: able to perform ADLs sitting in 4WW   Standing balance support: Single extremity supported Standing balance-Leahy Scale: Fair Standing balance comment: Pt able to stand and gather items from closet holding onto rolloter with one hand                           ADL either performed or assessed with clinical judgement   ADL Overall ADL's : Needs assistance/impaired                 Upper Body Dressing : Supervision/safety   Lower Body Dressing: Supervision/safety               Functional mobility during ADLs: Supervision/safety General ADL Comments: Pt displays good safety awareness and ability to complete ADLs mod I using reacher/sock aide for LB dressing. Pt able to ambulate around room with 4WW and use seat for transfering objects with supervision    Extremity/Trunk Assessment Upper Extremity Assessment Upper Extremity Assessment: Overall WFL for tasks assessed            Vision       Perception     Praxis      Cognition Arousal/Alertness: Awake/alert Behavior During Therapy: Flat affect Overall Cognitive Status: Within Functional  Limits for tasks assessed                                 General Comments: good safety awareness, problem solving with mobility/ADLs using rollator        Exercises      Shoulder Instructions       General Comments      Pertinent Vitals/ Pain       Pain Assessment Pain Assessment: 0-10 Pain Score: 2  Faces Pain Scale: Hurts a little bit  Home Living                                           Prior Functioning/Environment              Frequency  Min 2X/week        Progress Toward Goals  OT Goals(current goals can now be found in the care plan section)  Progress towards OT goals: Progressing toward goals  Acute Rehab OT Goals Patient Stated Goal: to return home OT Goal Formulation: With patient Time For Goal Achievement: 01/24/23 Potential to Achieve Goals: Good ADL Goals Pt Will Perform Lower Body Dressing: with modified independence;with adaptive equipment;sit to/from stand Pt Will Transfer to Toilet: with modified independence Additional ADL Goal #1: Pt will be able to use rollator to gather/transport objects needed to complete ADLs with mod I while displaying good safety awareness  Plan Discharge plan remains appropriate    Co-evaluation                 AM-PAC OT "6 Clicks" Daily Activity     Outcome Measure   Help from another person eating meals?: None Help from another person taking care of personal grooming?: None Help from another person toileting, which includes using toliet, bedpan, or urinal?: A Little Help from another person bathing (including washing, rinsing, drying)?: A Little Help from another person to put on and taking off regular upper body clothing?: None Help from another person to put on and taking off regular lower body clothing?: A Little 6 Click Score: 21    End of Session Equipment Utilized During Treatment: Gait belt;Rollator (4 wheels)  OT Visit Diagnosis: Unsteadiness on feet (R26.81);Other abnormalities of gait and mobility (R26.89);Muscle weakness (generalized) (M62.81);Pain Pain - Right/Left: Left Pain - part of body: Hip   Activity Tolerance Patient tolerated treatment well   Patient Left in bed;with call bell/phone within reach   Nurse Communication Mobility status        Time: 1610-9604 OT Time Calculation (min): 15 min  Charges: OT General Charges $OT Visit: 1 Visit OT  Treatments $Self Care/Home Management : 8-22 mins  [place], OTR/L   Alexis Goodell 01/15/2023, 4:45 PM

## 2023-01-16 DIAGNOSIS — S72002A Fracture of unspecified part of neck of left femur, initial encounter for closed fracture: Secondary | ICD-10-CM | POA: Diagnosis not present

## 2023-01-16 DIAGNOSIS — S72012A Unspecified intracapsular fracture of left femur, initial encounter for closed fracture: Secondary | ICD-10-CM | POA: Diagnosis not present

## 2023-01-16 LAB — CBC
HCT: 28.6 % — ABNORMAL LOW (ref 39.0–52.0)
Hemoglobin: 9.3 g/dL — ABNORMAL LOW (ref 13.0–17.0)
MCH: 24.3 pg — ABNORMAL LOW (ref 26.0–34.0)
MCHC: 32.5 g/dL (ref 30.0–36.0)
MCV: 74.9 fL — ABNORMAL LOW (ref 80.0–100.0)
Platelets: 358 10*3/uL (ref 150–400)
RBC: 3.82 MIL/uL — ABNORMAL LOW (ref 4.22–5.81)
RDW: 15.9 % — ABNORMAL HIGH (ref 11.5–15.5)
WBC: 5 10*3/uL (ref 4.0–10.5)
nRBC: 0 % (ref 0.0–0.2)

## 2023-01-16 LAB — COMPREHENSIVE METABOLIC PANEL
ALT: 23 U/L (ref 0–44)
AST: 30 U/L (ref 15–41)
Albumin: 2.4 g/dL — ABNORMAL LOW (ref 3.5–5.0)
Alkaline Phosphatase: 58 U/L (ref 38–126)
Anion gap: 8 (ref 5–15)
BUN: 18 mg/dL (ref 8–23)
CO2: 25 mmol/L (ref 22–32)
Calcium: 8.9 mg/dL (ref 8.9–10.3)
Chloride: 106 mmol/L (ref 98–111)
Creatinine, Ser: 0.89 mg/dL (ref 0.61–1.24)
GFR, Estimated: >60 mL/min (ref >60)
Glucose, Bld: 112 mg/dL — ABNORMAL HIGH (ref 70–99)
Potassium: 3.5 mmol/L (ref 3.5–5.1)
Sodium: 139 mmol/L (ref 135–145)
Total Bilirubin: 0.4 mg/dL (ref 0.3–1.2)
Total Protein: 6.2 g/dL — ABNORMAL LOW (ref 6.5–8.1)

## 2023-01-16 LAB — GLUCOSE, CAPILLARY
Glucose-Capillary: 82 mg/dL (ref 70–99)
Glucose-Capillary: 91 mg/dL (ref 70–99)
Glucose-Capillary: 105 mg/dL — ABNORMAL HIGH (ref 70–99)
Glucose-Capillary: 90 mg/dL (ref 70–99)

## 2023-01-16 NOTE — Progress Notes (Signed)
  Progress Note   Patient: Peter [name] ZOX:[id number] DOB: [birth year] DOA: 01/08/2023     8 DOS: the patient was seen and examined on 01/16/2023   Brief hospital course: 72 year old with a history of alcoholism, tobacco abuse, COPD, DM2, HTN, HLD, cirrhosis of the liver, anxiety/depression, and CVA with chronic left-sided weakness who reported a mechanical fall landing on his left hip after which he experienced persistent left hip pain. In the ER he was diagnosed with a left subcapital femoral neck fracture.   Assessment and Plan: Displaced left femoral neck fracture status post mechanical fall Status post left total hip replacement 01/09/2023 by Dr. Roda Shutters - postoperative care and DVT prophylaxis per Orthopedics - the patient will be due for staple/suture removal 2 weeks postoperatively  -CT of thigh/hip reviewed, findings of some gas and fluid -Per Orthopedic Surgery, fever is likely secondary to recent surgery or could be from atelectasis. Recs for IS and pulm toilet   Fever of unknown origin Intermittent fevers noted, mainly in the evenings -denies urinary sx - CXR unrevealing with no clinical evidence to suggest pneumonia - holding off on antibiotics for now  -COVID neg -Blood cx pending -Per ortho, fevers are likely related to being post-op -No further fevers thus far since 7/10   Alcoholism Continue prophylactic low-dose benzo during admission - no withdrawal symptoms encountered thus far    Alcoholic cirrhosis of the liver No acute issues at present   DM2 Diet controlled - CBG well-controlled at present - A1c 5.9 with patient not on home medical therapy   COPD Well compensated presently   History of CVA with chronic left-sided weakness   Subjective: Without complaints  Physical Exam: Vitals:   01/15/23 1442 01/15/23 1941 01/16/23 0456 01/16/23 0756  BP: (!) 92/55 (!) 98/58 (!) 100/53 114/74  Pulse: 92 98 87 84  Resp: (!) 24 17 16 17   Temp: 98.8 F (37.1 C) 99.3 F (37.4  C)  98.4 F (36.9 C)  TempSrc: Oral Oral  Oral  SpO2: 99% 100% 100% 100%  Weight:      Height:       General exam: Awake, laying in bed, in nad Respiratory system: Normal respiratory effort, no wheezing Cardiovascular system: regular rate, s1, s2 Gastrointestinal system: Soft, nondistended, positive BS Central nervous system: CN2-12 grossly intact, strength intact Extremities: Perfused, no clubbing Skin: Normal skin turgor, no notable skin lesions seen Psychiatry: Mood normal // no visual hallucinations   Data Reviewed:  Labs reviewed: Na 139, K 3.5, Cr 0.89, Hgb 9.3, WBC 5.0  Family Communication: Pt in room, family not at bedside  Disposition: Status is: Inpatient Remains inpatient appropriate because: Severity of illness  Planned Discharge Destination: Home    Author: Rickey Barbara, MD 01/16/2023 4:52 PM  For on call review [email or web address].

## 2023-01-16 NOTE — Care Management Important Message (Signed)
Important Message  Patient Details  Name: Peter [name] MRN: [id number] Date of Birth: [birth year]   Medicare Important Message Given:  Yes     Sherilyn Banker 01/16/2023, 3:47 PM

## 2023-01-16 NOTE — Plan of Care (Signed)
  Problem: Education: Goal: Knowledge of General Education information will improve Description: Including pain rating scale, medication(s)/side effects and non-pharmacologic comfort measures Outcome: Progressing   Problem: Health Behavior/Discharge Planning: Goal: Ability to manage health-related needs will improve Outcome: Progressing   Problem: Clinical Measurements: Goal: Ability to maintain clinical measurements within normal limits will improve Outcome: Progressing Goal: Will remain free from infection Outcome: Progressing Goal: Diagnostic test results will improve Outcome: Progressing Goal: Respiratory complications will improve Outcome: Progressing Goal: Cardiovascular complication will be avoided Outcome: Progressing   Problem: Activity: Goal: Risk for activity intolerance will decrease Outcome: Progressing   Problem: Nutrition: Goal: Adequate nutrition will be maintained Outcome: Progressing   Problem: Coping: Goal: Level of anxiety will decrease Outcome: Progressing   Problem: Elimination: Goal: Will not experience complications related to bowel motility Outcome: Progressing Goal: Will not experience complications related to urinary retention Outcome: Progressing   Problem: Pain Managment: Goal: General experience of comfort will improve Outcome: Progressing   Problem: Safety: Goal: Ability to remain free from injury will improve Outcome: Progressing   Problem: Skin Integrity: Goal: Risk for impaired skin integrity will decrease Outcome: Progressing   Problem: Education: Goal: Verbalization of understanding the information provided (i.e., activity precautions, restrictions, etc) will improve Outcome: Progressing Goal: Individualized Educational Video(s) Outcome: Progressing   Problem: Activity: Goal: Ability to ambulate and perform ADLs will improve Outcome: Progressing   Problem: Clinical Measurements: Goal: Postoperative complications will be  avoided or minimized Outcome: Progressing   Problem: Self-Concept: Goal: Ability to maintain and perform role responsibilities to the fullest extent possible will improve Outcome: Progressing   Problem: Pain Management: Goal: Pain level will decrease Outcome: Progressing   Problem: Education: Goal: Ability to describe self-care measures that may prevent or decrease complications (Diabetes Survival Skills Education) will improve Outcome: Progressing Goal: Individualized Educational Video(s) Outcome: Progressing   Problem: Coping: Goal: Ability to adjust to condition or change in health will improve Outcome: Progressing   Problem: Fluid Volume: Goal: Ability to maintain a balanced intake and output will improve Outcome: Progressing   Problem: Health Behavior/Discharge Planning: Goal: Ability to identify and utilize available resources and services will improve Outcome: Progressing Goal: Ability to manage health-related needs will improve Outcome: Progressing   Problem: Metabolic: Goal: Ability to maintain appropriate glucose levels will improve Outcome: Progressing   Problem: Nutritional: Goal: Maintenance of adequate nutrition will improve Outcome: Progressing Goal: Progress toward achieving an optimal weight will improve Outcome: Progressing   Problem: Skin Integrity: Goal: Risk for impaired skin integrity will decrease Outcome: Progressing   Problem: Tissue Perfusion: Goal: Adequacy of tissue perfusion will improve Outcome: Progressing   

## 2023-01-16 NOTE — Progress Notes (Signed)
Physical Therapy Treatment Patient Details Name: Peter [name] MRN: [id number] DOB: [birth year] Today's Date: 01/16/2023   History of Present Illness The pt is a 72 yo male presenting 7/4 after feeling a pop in his L hip while walking, pt reports unable to ambulate after the pop. Pt found to have femoral neck fx, now s/p L THA 7/5. PMH includes: anemia, anxiety, cirrhosis of liver, DM II, ETOH abuse, emphysema, HLD, HTN, and stroke with L-sided weakness.    PT Comments  Pt tolerated treatment well today. Pt was able to progress ambulation distance in hallway with rollator supervision level. No change in DC/DME recs at this time. Pt will continue to follow.    Assistance Recommended at Discharge Frequent or constant Supervision/Assistance  If plan is discharge home, recommend the following:  Can travel by private vehicle    A little help with walking and/or transfers;A little help with bathing/dressing/bathroom;Assistance with cooking/housework;Direct supervision/assist for financial management;Assist for transportation;Help with stairs or ramp for entrance      Equipment Recommendations  Rollator (4 wheels)    Recommendations for Other Services       Precautions / Restrictions Precautions Precautions: Fall Restrictions Weight Bearing Restrictions: Yes LLE Weight Bearing: Weight bearing as tolerated     Mobility  Bed Mobility Overal bed mobility: Modified Independent Bed Mobility: Supine to Sit, Sit to Supine     Supine to sit: Modified independent (Device/Increase time) Sit to supine: Modified independent (Device/Increase time)        Transfers Overall transfer level: Modified independent Equipment used: Rollator (4 wheels) Transfers: Sit to/from Stand             General transfer comment: Good safety awareness with rollator. Able to manage brakes independently    Ambulation/Gait Ambulation/Gait assistance: Supervision Gait Distance (Feet): 600  Feet Assistive device: Rollator (4 wheels) Gait Pattern/deviations: Step-through pattern, Decreased stance time - left, Decreased dorsiflexion - left, Trunk flexed, Antalgic Gait velocity: decreased     General Gait Details: no LOB noted.   Stairs             Wheelchair Mobility     Tilt Bed    Modified Rankin (Stroke Patients Only)       Balance Overall balance assessment: Needs assistance Sitting-balance support: No upper extremity supported, Feet supported Sitting balance-Leahy Scale: Good     Standing balance support: Bilateral upper extremity supported Standing balance-Leahy Scale: Fair                              Cognition Arousal/Alertness: Awake/alert Behavior During Therapy: Flat affect Overall Cognitive Status: Within Functional Limits for tasks assessed                                 General Comments: good safety awareness, problem solving with mobility/ADLs using rollator        Exercises      General Comments General comments (skin integrity, edema, etc.): VSS on RA      Pertinent Vitals/Pain Pain Assessment Pain Assessment: 0-10 Pain Score: 6  Pain Location: L hip Pain Descriptors / Indicators: Tender Pain Intervention(s): Monitored during session, Repositioned    Home Living                          Prior Function  PT Goals (current goals can now be found in the care plan section) Progress towards PT goals: Progressing toward goals    Frequency    Min 3X/week      PT Plan Current plan remains appropriate    Co-evaluation              AM-PAC PT "6 Clicks" Mobility   Outcome Measure  Help needed turning from your back to your side while in a flat bed without using bedrails?: None Help needed moving from lying on your back to sitting on the side of a flat bed without using bedrails?: None Help needed moving to and from a bed to a chair (including a wheelchair)?:  None Help needed standing up from a chair using your arms (e.g., wheelchair or bedside chair)?: None Help needed to walk in hospital room?: A Little Help needed climbing 3-5 steps with a railing? : A Little 6 Click Score: 22    End of Session Equipment Utilized During Treatment: Gait belt Activity Tolerance: Patient tolerated treatment well Patient left: in bed;with bed alarm set Nurse Communication: Mobility status PT Visit Diagnosis: Unsteadiness on feet (R26.81);Muscle weakness (generalized) (M62.81);Pain Pain - Right/Left: Left Pain - part of body: Hip;Leg     Time: 8295-6213 PT Time Calculation (min) (ACUTE ONLY): 14 min  Charges:    $Gait Training: 8-22 mins PT General Charges $$ ACUTE PT VISIT: 1 Visit                     Shela Nevin, PT, DPT Acute Rehab Services [phone]    Peter [name] 01/16/2023, 11:43 AM

## 2023-01-16 NOTE — TOC Progression Note (Signed)
Transition of Care [facility]) - Progression Note    Patient Details  Name: Peter [name] MRN: [id number] Date of Birth: October 28, 1950  Transition of Care [facility]) CM/SW Contact  Kermit Balo, RN Phone Number: 01/16/2023, 2:32 PM  Clinical Narrative:    CM noted rollator in patients room.  TOC following for further d/c needs.   Expected Discharge Plan: Home w Home Health Services Barriers to Discharge: Continued Medical Work up  Expected Discharge Plan and Services       Living arrangements for the past 2 months: Single Family Home                 DME Arranged: Walker rolling, 3-N-1 DME Agency: [employer] Date DME Agency Contacted: 01/10/23 Time DME Agency Contacted: 1250 Representative spoke with at DME Agency: Leavy Cella HH Arranged: PT HH Agency: [facility] Date [facility] Agency Contacted: 01/10/23 Time HH Agency Contacted: 1250 Representative spoke with at [facility] Agency: Kandee Keen   Social Determinants of Health (SDOH) Interventions SDOH Screenings   Food Insecurity: No Food Insecurity (01/08/2023)  Housing: Low Risk  (01/08/2023)  Transportation Needs: No Transportation Needs (01/08/2023)  Utilities: Not At Risk (01/08/2023)  Tobacco Use: High Risk (01/09/2023)    Readmission Risk Interventions     No data to display

## 2023-01-17 DIAGNOSIS — S72012A Unspecified intracapsular fracture of left femur, initial encounter for closed fracture: Secondary | ICD-10-CM | POA: Diagnosis not present

## 2023-01-17 DIAGNOSIS — S72002A Fracture of unspecified part of neck of left femur, initial encounter for closed fracture: Secondary | ICD-10-CM | POA: Diagnosis not present

## 2023-01-17 LAB — GLUCOSE, CAPILLARY
Glucose-Capillary: 93 mg/dL (ref 70–99)
Glucose-Capillary: 102 mg/dL — ABNORMAL HIGH (ref 70–99)

## 2023-01-17 MED ORDER — DOCUSATE SODIUM 100 MG PO CAPS: 100.0000 mg | ORAL_CAPSULE | Freq: Two times a day (BID) | ORAL | 0 refills | Status: AC

## 2023-01-17 NOTE — Discharge Summary (Signed)
Physician Discharge Summary   Patient: Peter [name] MRN: [id number] DOB: 1950/10/02  Admit date:     01/08/2023  Discharge date: 01/17/23  Discharge Physician: Rickey Barbara   PCP: Raymon Mutton., FNP   Recommendations at discharge:    Follow up with PCP in 1-2 weeks Follow up with Orthopedic Surgery as scheduled  Discharge Diagnoses: Principal Problem:   Closed subcapital fracture of neck of left femur, initial encounter (HCC) Active Problems:   COPD (chronic obstructive pulmonary disease) (HCC)   BPH (benign prostatic hyperplasia)   Cirrhosis (HCC)   Liver cirrhosis, alcoholic (HCC)   Tobacco abuse   Alcohol abuse   Malnutrition of moderate degree (HCC)   History of CVA (cerebrovascular accident)   Protein-calorie malnutrition, severe  Resolved Problems:   * No resolved hospital problems. *  Hospital Course: 72 year old with a history of alcoholism, tobacco abuse, COPD, DM2, HTN, HLD, cirrhosis of the liver, anxiety/depression, and CVA with chronic left-sided weakness who reported a mechanical fall landing on his left hip after which he experienced persistent left hip pain. In the ER he was diagnosed with a left subcapital femoral neck fracture.   Assessment and Plan: Displaced left femoral neck fracture status post mechanical fall Status post left total hip replacement 01/09/2023 by Dr. Roda Shutters - postoperative care and DVT prophylaxis per Orthopedics - the patient will be due for staple/suture removal 2 weeks postoperatively  -CT of thigh/hip reviewed, findings of some gas and fluid -Per Orthopedic Surgery, fever is likely secondary to recent surgery or could be from atelectasis. Later remained fever free   Fever of unknown origin Intermittent fevers noted, mainly in the evenings -denies urinary sx - CXR unrevealing with no clinical evidence to suggest pneumonia - holding off on antibiotics  -COVID neg -Blood cx neg x 2 days -Per ortho, fevers are likely related to being  post-op -No further fevers thus far since 7/10   Alcoholism Continue prophylactic low-dose benzo during admission - no withdrawal symptoms encountered thus far    Alcoholic cirrhosis of the liver No acute issues at present   DM2 Diet controlled - CBG well-controlled at present - A1c 5.9 with patient not on home medical therapy   COPD Well compensated presently   History of CVA with chronic left-sided weakness       Consultants: Orthopedic Surgery Procedures performed: Total hip replacement; Left hip   Disposition: Home Diet recommendation:  Regular diet DISCHARGE MEDICATION: Allergies as of 01/17/2023       Reactions   Poison Ivy Extract [poison Ivy Extract] Rash        Medication List     STOP taking these medications    traMADol 50 MG tablet Commonly known as: ULTRAM       TAKE these medications    atorvastatin 40 MG tablet Commonly known as: LIPITOR Take 40 mg by mouth at bedtime.   docusate sodium 100 MG capsule Commonly known as: COLACE Take 1 capsule (100 mg total) by mouth 2 (two) times daily.   enoxaparin 40 MG/0.4ML injection Commonly known as: LOVENOX Inject 0.4 mLs (40 mg total) into the skin daily for 14 days.   ferrous sulfate 325 (65 FE) MG tablet Take 325 mg by mouth daily.   finasteride 5 MG tablet Commonly known as: PROSCAR Take 5 mg by mouth daily.   mirtazapine 15 MG tablet Commonly known as: REMERON Take 15 mg by mouth at bedtime.   montelukast 10 MG tablet Commonly known as: SINGULAIR  Take 10 mg by mouth at bedtime.   oxyCODONE 5 MG immediate release tablet Commonly known as: Oxy IR/ROXICODONE Take 1-2 tablets (5-10 mg total) by mouth every 8 (eight) hours as needed for severe pain.   tamsulosin 0.4 MG Caps capsule Commonly known as: FLOMAX Take 0.4 mg by mouth daily.               Durable Medical Equipment  (From admission, onward)           Start     Ordered   01/10/23 1428  For home use only DME 4  wheeled rolling walker with seat  Once       Question:  Patient needs a walker to treat with the following condition  Answer:  Displaced fracture of left femoral neck (HCC)   01/10/23 1427              Discharge Care Instructions  (From admission, onward)           Start     Ordered   01/09/23 0000  Weight bearing as tolerated        01/09/23 1338            Follow-up Information     Cristie Hem, PA-C Follow up in 2 week(s).   Specialty: Orthopedic Surgery Why: For suture removal, For wound re-check Contact information: [address] [place] [address] [phone]         [facility] Follow up.   Specialty: Home Health Services Why: Physical therapy. They will call to arrange follow up when you are discharged. Contact information: [address] [place] [address] [phone]         Raymon Mutton., FNP Follow up.   Specialty: Family Medicine Contact information: [address] [place] [address] [phone]                Discharge Exam: Ceasar Mons Weights   01/08/23 2440 01/09/23 1258  Weight: 57.2 kg 56.7 kg   General exam: Awake, laying in bed, in nad Respiratory system: Normal respiratory effort, no wheezing Cardiovascular system: regular rate, s1, s2 Gastrointestinal system: Soft, nondistended, positive BS Central nervous system: CN2-12 grossly intact, strength intact Extremities: Perfused, no clubbing Skin: Normal skin turgor, no notable skin lesions seen Psychiatry: Mood normal // no visual hallucinations   Condition at discharge: fair  The results of significant diagnostics from this hospitalization (including imaging, microbiology, ancillary and laboratory) are listed below for reference.   Imaging Studies: CT FEMUR LEFT W CONTRAST  Result Date: 01/13/2023 CLINICAL DATA:  Low-grade femur, postoperative 01/09/2023 left total hip replacement for femoral neck fracture. EXAM: CT OF THE  LOWER LEFT EXTREMITY WITH CONTRAST TECHNIQUE: Multidetector CT imaging of the lower left extremity was performed according to the standard protocol following intravenous contrast administration. RADIATION DOSE REDUCTION: This exam was performed according to the departmental dose-optimization program which includes automated exposure control, adjustment of the mA and/or kV according to patient size and/or use of iterative reconstruction technique. CONTRAST:  75mL OMNIPAQUE IOHEXOL 350 MG/ML SOLN COMPARISON:  Radiograph 01/09/2023 FINDINGS: Bones/Joint/Cartilage Partially fused left SI joint anteriorly. Subtle nondisplaced fracture extending from the inferior acetabular rim into the posterior portion of the left inferior pubic ramus, images 56 through 70 of series 3 and image 80 series 8. No other periprosthetic fracture is identified. Small amount of gas along the margins of the acetabular shell portion of the prosthesis and in the medullary space  of the the femur distal to the tip of the stem (image 89 of series 9). The posterior lucency also visible on this image is a nutrient foramen. The small amounts of gas are not necessarily unexpected on postoperative day 4. Ligaments Suboptimally assessed by CT. Muscles and Tendons Collection of multilocular gas and fluid noted deep to the tensor fascia lata and tracking within along the superficial margin of the vastus lateralis, collection measuring about 5.9 by 1.9 by 24.0 cm although the distal portion is tapered substantially and tracks along the vastus lateralis superficially. Image 37 of series 10 illustrates this appearance. Much of this may be postoperative blood products and postoperative gas, strictly speaking in this setting it is difficult to exclude the possibility of an infectious process although this does not have obvious walled-off enhancing margins at this time. Soft tissues Small amount of subcutaneous gas and edema overlying the left hip, reasonably  commensurate with postoperative day 4. Atheromatous vascular disease noted. IMPRESSION: 1. Subtle nondisplaced fracture extending from the inferior acetabular rim into the posterior portion of the left inferior pubic ramus. 2. Collection of multilocular gas and fluid deep to the tensor fascia lata and tracking along the superficial margin of the vastus lateralis, measuring about 5.9 by 1.9 by 24.0 cm although the distal portion is tapered substantially and tracks along the vastus lateralis superficially. Much of this may be postoperative blood products and postoperative gas, strictly speaking in this setting it is difficult to exclude the possibility of an infectious process although this does not have obvious walled-off enhancing margins at this time. 3. Small amount of gas along the margins of the acetabular shell portion of the prosthesis and in the medullary space of the femur distal to the tip of the stem. The small amounts of gas are not necessarily unexpected on postoperative day 4. 4. Partially fused left SI joint anteriorly. 5. Atheromatous vascular disease. Electronically Signed   By: Gaylyn Rong M.D.   On: 01/13/2023 15:30   DG Chest Port 1 View  Result Date: 01/11/2023 CLINICAL DATA:  Fever of unknown origin. EXAM: PORTABLE CHEST 1 VIEW COMPARISON:  Radiograph 07/14/2022.  CT 04/01/2022 FINDINGS: The heart is normal in size. Stable mediastinal contours. Emphysema with chronic bronchial thickening. Right upper lobe fiducial marker with minimal ill-defined opacity, chronic. Minimal ill-defined opacity in the medial lung bases. No pleural fluid or pneumothorax. No acute osseous abnormalities. IMPRESSION: 1. Minimal ill-defined opacity in the medial lung bases, favor atelectasis, although pneumonia or aspiration is could have a similar appearance in the setting of fever. 2. Emphysema with chronic bronchial thickening. Electronically Signed   By: Narda Rutherford M.D.   On: 01/11/2023 17:35    Pelvis Portable  Result Date: 01/09/2023 CLINICAL DATA:  Postop hip replacement. EXAM: PORTABLE PELVIS 1-2 VIEWS COMPARISON:  Preoperative radiograph FINDINGS: Left hip arthroplasty in expected alignment. No periprosthetic lucency or fracture. Recent postsurgical change includes air and edema in the soft tissues. IMPRESSION: Left hip arthroplasty without immediate postoperative complication. Electronically Signed   By: Narda Rutherford M.D.   On: 01/09/2023 17:18   DG HIP UNILAT WITH PELVIS 2-3 VIEWS LEFT  Result Date: 01/09/2023 CLINICAL DATA:  Total left hip arthroplasty. Intraoperative fluoroscopy. EXAM: DG HIP (WITH OR WITHOUT PELVIS) 2-3V LEFT COMPARISON:  Pelvis and left hip radiographs 01/08/2023 FINDINGS: Images were performed intraoperatively without the presence of a radiologist. The patient is undergoing total left hip arthroplasty. No hardware complication is seen. Total fluoroscopy images: 5 Total fluoroscopy time:  21 seconds Total dose: Radiation Exposure Index (as provided by the fluoroscopic device): 1.58 mGy air Kerma Please see intraoperative findings for further detail. IMPRESSION: Intraoperative fluoroscopy for total left hip arthroplasty. Electronically Signed   By: Neita Garnet M.D.   On: 01/09/2023 16:46   DG C-Arm 1-60 Min-No Report  Result Date: 01/09/2023 Fluoroscopy was utilized by the requesting physician.  No radiographic interpretation.   DG C-Arm 1-60 Min-No Report  Result Date: 01/09/2023 Fluoroscopy was utilized by the requesting physician.  No radiographic interpretation.   DG Hip Unilat With Pelvis 2-3 Views Left  Result Date: 01/08/2023 CLINICAL DATA:  Status post fall, left hip pain EXAM: DG HIP (WITH OR WITHOUT PELVIS) 2-3V LEFT COMPARISON:  None Available. FINDINGS: Generalized osteopenia. Acute mildly displaced left subcapital femoral neck fracture. No aggressive osseous lesion. Normal alignment. Ankylosis of bilateral SI joints. Soft tissue are unremarkable.  No radiopaque foreign body or soft tissue emphysema. Peripheral vascular atherosclerotic disease. IMPRESSION: 1. Acute mildly displaced left subcapital femoral neck fracture. Electronically Signed   By: Elige Ko M.D.   On: 01/08/2023 08:57    Microbiology: Results for orders placed or performed during the hospital encounter of 01/08/23  Surgical pcr screen     Status: None   Collection Time: 01/09/23  4:08 AM   Specimen: Nasal Mucosa; Nasal Swab  Result Value Ref Range Status   MRSA, PCR NEGATIVE NEGATIVE Final   Staphylococcus aureus NEGATIVE NEGATIVE Final    Comment: (NOTE) The Xpert SA Assay (FDA approved for NASAL specimens in patients 64 years of age and older), is one component of a comprehensive surveillance program. It is not intended to diagnose infection nor to guide or monitor treatment. Performed at [facility], [address] [place], [place] [address]   SARS Coronavirus 2 by RT PCR (hospital order, performed in [facility] hospital lab) *cepheid single result test* Anterior Nasal Swab     Status: None   Collection Time: 01/15/23  7:28 AM   Specimen: Anterior Nasal Swab  Result Value Ref Range Status   SARS Coronavirus 2 by RT PCR NEGATIVE NEGATIVE Final    Comment: Performed at [facility], [address], [place], [place] [address]  Culture, blood (Routine X 2) w Reflex to ID Panel     Status: None (Preliminary result)   Collection Time: 01/15/23  9:31 AM   Specimen: BLOOD  Result Value Ref Range Status   Specimen Description BLOOD BLOOD RIGHT HAND  Final   Special Requests   Final    BOTTLES DRAWN AEROBIC ONLY Blood Culture results may not be optimal due to an inadequate volume of blood received in culture bottles   Culture   Final    NO GROWTH 2 DAYS Performed at [facility], [address], [place], [place] [address]    Report Status PENDING  Incomplete  Culture, blood (Routine X 2) w Reflex to ID Panel     Status: None (Preliminary  result)   Collection Time: 01/15/23  9:34 AM   Specimen: BLOOD  Result Value Ref Range Status   Specimen Description BLOOD BLOOD LEFT HAND  Final   Special Requests   Final    BOTTLES DRAWN AEROBIC ONLY Blood Culture adequate volume   Culture   Final    NO GROWTH 2 DAYS Performed at [facility], [address], [place], [place] [address]    Report Status PENDING  Incomplete    Labs:  CBC: Recent Labs  Lab 01/12/23 0442 01/13/23 0331 01/14/23 0602 01/15/23 0352 01/16/23 0159  WBC 10.4 7.8 4.7 4.7 5.0  HGB 9.7* 9.7* 9.9* 10.2* 9.3*  HCT 30.2* 29.7* 32.8* 31.3* 28.6*  MCV 74.6* 73.3* 77.9* 75.6* 74.9*  PLT 266 306 307 334 358   Basic Metabolic Panel: Recent Labs  Lab 01/12/23 0442 01/13/23 0331 01/15/23 0352 01/16/23 0159  NA 136 135 138 139  K 3.7 4.1 4.7 3.5  CL 108 105 103 106  CO2 23 22 26 25   GLUCOSE 108* 100* 111* 112*  BUN 14 16 18 18   CREATININE 0.96 0.89 1.04 0.89  CALCIUM 8.2* 8.7* 9.4 8.9   Liver Function Tests: Recent Labs  Lab 01/12/23 0442 01/15/23 0352 01/16/23 0159  AST 20 28 30   ALT 9 18 23   ALKPHOS 44 57 58  BILITOT 0.4 0.4 0.4  PROT 5.6* 6.4* 6.2*  ALBUMIN 2.4* 2.6* 2.4*   CBG: Recent Labs  Lab 01/16/23 1134 01/16/23 1659 01/16/23 2035 01/17/23 0832 01/17/23 1155  GLUCAP 91 105* 90 93 102*    Discharge time spent: less than 30 minutes.  Signed: Rickey Barbara, MD [facility] 01/17/2023

## 2023-01-17 NOTE — TOC Transition Note (Addendum)
Transition of Care [facility]) - CM/SW Discharge Note   Patient Details  Name: Peter [name] MRN: [id number] Date of Birth: [birth year]  [facility] CM/SW Contact:  Ronny Bacon, RN Phone Number: 01/17/2023, 3:25 PM   Clinical Narrative:  Patient being discharged home today. Delila Spence aware of patient being discharged. Patient given cab voucher for ride home, address on file verified with patient.    Final next level of care: Home w Home Health Services Barriers to Discharge: Continued Medical Work up   Patient Goals and CMS Choice      Discharge Placement                         Discharge Plan and Services Additional resources added to the After Visit Summary for                  DME Arranged: Walker rolling, 3-N-1 DME Agency: [employer] Date DME Agency Contacted: 01/10/23 Time DME Agency Contacted: 1250 Representative spoke with at DME Agency: Leavy Cella HH Arranged: PT HH Agency: [facility] Date [facility] Agency Contacted: 01/10/23 Time HH Agency Contacted: 1250 Representative spoke with at [facility] Agency: Kandee Keen  Social Determinants of Health (SDOH) Interventions SDOH Screenings   Food Insecurity: No Food Insecurity (01/08/2023)  Housing: Low Risk  (01/08/2023)  Transportation Needs: No Transportation Needs (01/08/2023)  Utilities: Not At Risk (01/08/2023)  Tobacco Use: High Risk (01/09/2023)     Readmission Risk Interventions     No data to display

## 2023-01-17 NOTE — TOC CAGE-AID Note (Signed)
Transition of Care [facility]) - CAGE-AID Screening  Patient Details  Name: Peter [name] MRN: [id number] Date of Birth: Jul 01, 1951  Clinical Narrative:  Patient endorses alcohol use, cigarette smoking, and cocaine use. TOC consult placed.  CAGE-AID Screening:   Have You Ever Felt You Ought to Cut Down on Your Drinking or Drug Use?: Yes Have People Annoyed You By Critizing Your Drinking Or Drug Use?: No Have You Felt Bad Or Guilty About Your Drinking Or Drug Use?: No Have You Ever Had a Drink or Used Drugs First Thing In The Morning to Steady Your Nerves or to Get Rid of a Hangover?: No CAGE-AID Score: 1  Substance Abuse Education Offered: Yes  Substance abuse interventions: Referral to (must comment) (TOC)

## 2023-01-19 LAB — CULTURE, BLOOD (ROUTINE X 2)
Culture: NO GROWTH
Special Requests: ADEQUATE

## 2023-01-20 LAB — CULTURE, BLOOD (ROUTINE X 2): Culture: NO GROWTH

## 2023-01-21 ENCOUNTER — Telehealth: Payer: Self-pay | Admitting: Orthopaedic Surgery

## 2023-01-21 ENCOUNTER — Other Ambulatory Visit: Payer: Self-pay | Admitting: Physician Assistant

## 2023-01-21 MED ORDER — ENOXAPARIN SODIUM 40 MG/0.4ML IJ SOSY: 40.0000 mg | PREFILLED_SYRINGE | Freq: Every day | INTRAMUSCULAR | 0 refills | Status: AC

## 2023-01-21 MED ORDER — ENOXAPARIN SODIUM 40 MG/0.4ML IJ SOSY: 40.0000 mg | PREFILLED_SYRINGE | Freq: Every day | INTRAMUSCULAR | 0 refills | Status: DC

## 2023-01-21 MED ORDER — OXYCODONE HCL 5 MG PO TABS: 5.0000 mg | ORAL_TABLET | Freq: Three times a day (TID) | ORAL | 0 refills | Status: AC | PRN

## 2023-01-21 MED ORDER — OXYCODONE HCL 5 MG PO TABS: 5.0000 mg | ORAL_TABLET | Freq: Three times a day (TID) | ORAL | 0 refills | Status: DC | PRN

## 2023-01-21 NOTE — Telephone Encounter (Signed)
Received call from Amy (PT) with [facility] needing verbal orders for HHPT 2 Wk 2 and 1 Wk 2. Amy also said patient does not have Lovenox injections,not does patient have any pain medicine.  The number to contat Amy is [phone]

## 2023-01-21 NOTE — Telephone Encounter (Signed)
Both were printed and placed on patients chart in hospital.  I went ahead and erx to pharmacy on file

## 2023-01-23 NOTE — Telephone Encounter (Signed)
I called Peter [name] and advised. She will talk to patient about lovenox injections when she sees him next to be sure he is doing some kind of anticoagulant therapy.

## 2023-01-23 NOTE — Telephone Encounter (Signed)
I called Peter [name] and advised.   She states that patient told her he is not giving himself the injections. She does not feel that he will take the Lovenox and wonders if there is something orally that you may prescribe. She also feels that patient is excessively drinking-FYI.  Please advise on anticoagulant. I will call patient and Peter [name] back to advise.

## 2023-01-23 NOTE — Telephone Encounter (Signed)
He can take aspirin 81 mg BID x 6 weeks

## 2023-01-23 NOTE — Telephone Encounter (Signed)
I called patient and advised. He states that he already picked up the lovenox and he guesses he will try to take that. I explained that he will definitely be needing to take something, so if he is unable to do the injections, he will need to do the 81mg  aspirin bid x 6 weeks. Patient expressed understanding.  I tried to call Amy back to advise. Voicemail is not set up. Will try again.

## 2023-01-29 ENCOUNTER — Telehealth: Payer: Self-pay

## 2023-01-29 NOTE — Telephone Encounter (Signed)
Peter [name]. I told him that it was fine to remove the stitches, but we also need to see him for post operative x-rays. He is scheduled to come see [place] next Tuesday. Onalee Hua said that we would probably see him before he does again.

## 2023-01-29 NOTE — Telephone Encounter (Signed)
Peter [name] with [name] wanted to know if bandage and staples could be removed.  Patient had left hip surgery on 01/09/2023.  Cb# [phone].  Please advise.  Thank you.

## 2023-01-29 NOTE — Telephone Encounter (Signed)
Yes, as long as incision looks good.  Patient needs to come in for post-op xrays

## 2023-02-03 ENCOUNTER — Other Ambulatory Visit (INDEPENDENT_AMBULATORY_CARE_PROVIDER_SITE_OTHER): Payer: Medicare Other

## 2023-02-03 ENCOUNTER — Ambulatory Visit (INDEPENDENT_AMBULATORY_CARE_PROVIDER_SITE_OTHER): Payer: Medicare Other | Admitting: Physician Assistant

## 2023-02-03 DIAGNOSIS — Z96642 Presence of left artificial hip joint: Secondary | ICD-10-CM

## 2023-02-03 NOTE — Progress Notes (Signed)
Post-Op Visit Note   Patient: Peter [name]           Date of Birth: November 01, 1950           MRN: [id number] Visit Date: 02/03/2023 PCP: Raymon Mutton., FNP   Assessment & Plan:  Chief Complaint:  Chief Complaint  Patient presents with   Left Hip - Routine Post Op   Visit Diagnoses:  1. History of total left hip replacement     Plan: Patient is a pleasant 72 year old gentleman who comes in today approximately 4 weeks status post left total hip replacement from a femoral neck fracture, date of surgery 01/09/2023.  This is his first postop visit.  He has been ambulating with a walker.  He is getting home health physical therapy.  He tells me he has been taking Lovenox and has 2 injections left.  He is occasionally taking Percocet but tells me this does not help with pain.  Examination of his left hip reveals a well-healed surgical incision with nylon sutures in place.  No evidence of infection or cellulitis.  Calves are soft nontender.  He is neurovascularly intact distally.  Today, sutures were removed and Steri-Strips applied.  I would like for him to start a baby aspirin twice daily until he is 6 weeks out from surgery.  He will start this once he finishes his last 2 Lovenox injections.  He will follow-up with [place] in 4 weeks for repeat evaluation and AP pelvis x-rays.  Call with concerns or questions.  Follow-Up Instructions: Return in about 4 weeks (around 03/03/2023).   Orders:  Orders Placed This Encounter  Procedures   XR Pelvis 1-2 Views   No orders of the defined types were placed in this encounter.   Imaging: XR Pelvis 1-2 Views  Result Date: 02/03/2023 Well-seated prosthesis without complication   PMFS History: Patient Active Problem List   Diagnosis Date Noted   Protein-calorie malnutrition, severe 01/15/2023   Closed subcapital fracture of neck of left femur, initial encounter (HCC) 01/08/2023   Dyslipidemia 09/27/2021   BPH (benign prostatic hyperplasia)  09/27/2021   Acute on chronic anemia 09/27/2021   History of CVA (cerebrovascular accident) 09/27/2021   Acute metabolic encephalopathy 09/27/2021   AKI (acute kidney injury) (HCC) 09/27/2021   Olecranon fracture, right, closed, initial encounter 01/02/2019   Closed fracture of right olecranon process 01/02/2019   Pain in right leg 09/25/2017   Aortic atherosclerosis (HCC) 04/21/2017   Iron deficiency anemia 04/21/2017   Nodule of upper lobe of right lung 04/21/2017   Closed fracture of right fibula and tibia    Displaced fracture of fifth metatarsal bone, right foot, initial encounter for closed fracture    Postoperative fever 04/19/2017   Displaced oblique fracture of shaft of right tibia, initial encounter for closed fracture 04/16/2017   Closed fracture of lateral portion of right tibial plateau 04/16/2017   Tibial fracture 04/16/2017   Hypotension, postural 01/05/2015   UTI (lower urinary tract infection) 01/05/2015   Acute renal failure (HCC) 01/05/2015   ARF (acute renal failure) (HCC) 01/05/2015   Closed nondisplaced fracture of greater trochanter of right femur with routine healing 11/23/2014   Alcohol withdrawal (HCC) 11/21/2014   Acute confusional state 11/21/2014   Sepsis (HCC) 05/25/2014   Fever 05/25/2014   Malnutrition of moderate degree (HCC) 05/25/2014   Alcohol abuse    Smoker 12/15/2013   Acute respiratory failure (HCC) 10/28/2013   Delirium tremens (HCC) 10/28/2013   Acute respiratory  distress syndrome (ARDS) (HCC) 10/28/2013   Systolic CHF (HCC) 10/27/2013   Tobacco abuse 10/27/2013   Weakness 08/01/2013   Dizziness 04/06/2013   Chronic alcohol abuse 02/16/2013   Liver cirrhosis, alcoholic (HCC) 02/16/2013   Depression (emotion) 02/16/2013   UTI (urinary tract infection) 01/16/2013   Hypokalemia 01/15/2013   Hypomagnesemia 01/15/2013   Fever, unspecified 01/15/2013   Leukopenia 01/13/2013   Orthostatic hypotension 01/13/2013   Alcohol intoxication  (HCC) 01/12/2013   Syncope 01/12/2013   Lactic acidosis 01/12/2013   Cirrhosis (HCC) 01/12/2013   History of stroke 01/12/2013   Acute alcohol intoxication (HCC) 08/30/2012   Iron deficiency 02/04/2012   Hyponatremia 01/31/2012   COPD (chronic obstructive pulmonary disease) (HCC) 01/03/2012   Past Medical History:  Diagnosis Date   Ambulates with cane    straight cane   Anemia    iron def.   Anxiety    no meds   Bone spur of other site    Left Foot   Cataract    surgery to remove   Chronic pain    Cirrhosis of liver (HCC)    Depression    no meds   Diabetes mellitus without complication (HCC)    Patient Denies - No meds   Emphysema of lung (HCC)    ETOH abuse    Glaucoma    Hard of hearing    Hyperlipemia    Hypertension    not on medication   Impaired mobility    uses cane   Pneumonia 08/17/2021   x 1   Sinusitis    Stroke [facility])    weakness left side   Total self care deficit    Denies - Performs ADLs without assistance per patient 03/30/19    Family History  Problem Relation Age of Onset   Breast cancer Mother    Diabetes Maternal Aunt    Heart disease Sister    Heart disease Maternal Aunt     Past Surgical History:  Procedure Laterality Date   bone spur  2013   left spur   BRONCHIAL BIOPSY  07/14/2022   Procedure: BRONCHIAL BIOPSIES;  Surgeon: Leslye Peer, MD;  Location: [facility] ENDOSCOPY;  Service: Pulmonary;;   BRONCHIAL BRUSHINGS  07/14/2022   Procedure: BRONCHIAL BRUSHINGS;  Surgeon: Leslye Peer, MD;  Location: [facility] ENDOSCOPY;  Service: Pulmonary;;   BRONCHIAL NEEDLE ASPIRATION BIOPSY  07/14/2022   Procedure: BRONCHIAL NEEDLE ASPIRATION BIOPSIES;  Surgeon: Leslye Peer, MD;  Location: [facility] ENDOSCOPY;  Service: Pulmonary;;   BRONCHIAL WASHINGS  07/14/2022   Procedure: BRONCHIAL WASHINGS;  Surgeon: Leslye Peer, MD;  Location: [facility] ENDOSCOPY;  Service: Pulmonary;;   CATARACT EXTRACTION W/ INTRAOCULAR LENS IMPLANT Bilateral    COLONOSCOPY      ESOPHAGOGASTRODUODENOSCOPY  01/09/2012   Procedure: ESOPHAGOGASTRODUODENOSCOPY (EGD);  Surgeon: Theda Belfast, MD;  Location: [facility] ENDOSCOPY;  Service: Endoscopy;  Laterality: N/A;   FIDUCIAL MARKER PLACEMENT  07/14/2022   Procedure: FIDUCIAL MARKER PLACEMENT;  Surgeon: Leslye Peer, MD;  Location: [facility] ENDOSCOPY;  Service: Pulmonary;;   IM NAILING TIBIA Right 04/16/2017   MOUTH SURGERY     MULTIPLE EXTRACTIONS WITH ALVEOLOPLASTY  03/29/2012   Procedure: MULTIPLE EXTRACION WITH ALVEOLOPLASTY;  Surgeon: Georgia Lopes, DDS;  Location: [facility] OR;  Service: Oral Surgery;  Laterality: Bilateral;   ORIF ELBOW FRACTURE Right 01/03/2019   Procedure: OPEN REDUCTION INTERNAL FIXATION (ORIF) ELBOW/OLECRANON FRACTURE;  Surgeon: Ernest Mallick, MD;  Location: [facility] OR;  Service: Orthopedics;  Laterality: Right;  ORIF ELBOW FRACTURE Right 03/31/2019   Procedure: right olecranon nonunion revision open reduction, internal fixation and surgery as indicated;  Surgeon: Ernest Mallick, MD;  Location: [facility] OR;  Service: Orthopedics;  Laterality: Right;    TIBIA IM NAIL INSERTION Right 04/16/2017   Procedure: INTRAMEDULLARY (IM) NAIL TIBIAL;  Surgeon: Tarry Kos, MD;  Location: [facility] OR;  Service: Orthopedics;  Laterality: Right;   TOTAL HIP ARTHROPLASTY Left 01/09/2023   Procedure: TOTAL HIP ARTHROPLASTY ANTERIOR APPROACH;  Surgeon: Tarry Kos, MD;  Location: [facility] OR;  Service: Orthopedics;  Laterality: Left;   VIDEO BRONCHOSCOPY WITH RADIAL ENDOBRONCHIAL ULTRASOUND  07/14/2022   Procedure: VIDEO BRONCHOSCOPY WITH RADIAL ENDOBRONCHIAL ULTRASOUND;  Surgeon: Leslye Peer, MD;  Location: [facility] ENDOSCOPY;  Service: Pulmonary;;   Social History   Occupational History   Occupation: disabled  Tobacco Use   Smoking status: Every Day    Current packs/day: 1.00    Average packs/day: 1 pack/day for 30.0 years (30.0 ttl pk-yrs)    Types: Cigarettes   Smokeless tobacco: Never   Tobacco comments:    3-5 cigarettes  per day ALS 12/15  Vaping Use   Vaping status: Never Used  Substance and Sexual Activity   Alcohol use: Yes    Alcohol/week: 7.0 standard drinks of alcohol    Types: 7 Cans of beer per week   Drug use: Yes    Types: Cocaine    Comment: last use crack cocainen 07/10/22 (as of 07/11/22)   Sexual activity: Yes    Birth control/protection: None

## 2023-07-14 ENCOUNTER — Telehealth: Payer: Self-pay | Admitting: *Deleted

## 2023-07-14 ENCOUNTER — Encounter: Payer: Self-pay | Admitting: *Deleted

## 2023-07-14 NOTE — Telephone Encounter (Signed)
 Attempted to contact pt to rescheduled follow up lung screening CT. Left detailed VM for pt to call back. Also mailed letter to pt's home address asking him to call us to schedule.

## 2023-08-21 ENCOUNTER — Telehealth: Payer: Self-pay | Admitting: Internal Medicine

## 2023-08-21 NOTE — Telephone Encounter (Addendum)
[facility] [place] Clinical Research Coordinator note:   This visit for Subject Peter [name] with DOB: 28-Feb-1951 on 08/21/2023. Subject contacted regarding [id number] to inform that Principal Investigator has changed to Dr. Delton Coombes. Voicemail left requesting a return call.

## 2023-09-21 NOTE — Telephone Encounter (Signed)
 Patient called in and left a VM to reschedule his LDCT. Called patient back and had to leave another VM.

## 2023-09-22 ENCOUNTER — Telehealth: Payer: Self-pay | Admitting: Nurse Practitioner

## 2023-09-22 DIAGNOSIS — R911 Solitary pulmonary nodule: Secondary | ICD-10-CM

## 2023-09-22 NOTE — Telephone Encounter (Signed)
 PT was a No Show for his CT scan and would like to resched. Pls call him @ [phone]

## 2023-10-08 ENCOUNTER — Telehealth: Payer: Self-pay

## 2023-10-08 NOTE — Telephone Encounter (Signed)
 Called and spoke with Peter [name], pts CT scan is scheduled for 4/9 and he has ov for 10/09/23. Advised pt we would like to cancel appt since RB has not seen him since 2023. 4/4 appt has been canceled and pt has been scheduled for 4/24 for ct follow up. Pt verbalized understanding and had no concerns. NFN

## 2023-10-09 ENCOUNTER — Ambulatory Visit: Admitting: Emergency Medicine

## 2023-10-14 ENCOUNTER — Other Ambulatory Visit

## 2023-10-29 ENCOUNTER — Encounter: Payer: Self-pay | Admitting: Emergency Medicine

## 2023-10-29 ENCOUNTER — Ambulatory Visit: Admitting: Emergency Medicine

## 2023-10-29 VITALS — BP 112/79 | HR 101 | Ht 67.5 in | Wt 122.0 lb

## 2023-10-29 DIAGNOSIS — J439 Emphysema, unspecified: Secondary | ICD-10-CM

## 2023-10-29 DIAGNOSIS — J449 Chronic obstructive pulmonary disease, unspecified: Secondary | ICD-10-CM

## 2023-10-29 DIAGNOSIS — R911 Solitary pulmonary nodule: Secondary | ICD-10-CM

## 2023-10-29 DIAGNOSIS — F17210 Nicotine dependence, cigarettes, uncomplicated: Secondary | ICD-10-CM | POA: Diagnosis not present

## 2023-10-29 NOTE — Assessment & Plan Note (Signed)
 High risk spiculated right upper lobe pulmonary nodule that has not been imaged since 03/2022.  He has a follow-up scan planned for 11/03/2023.  I will follow-up with him ASAP after that scan so we can decide next steps.  His pulmonary function testing from 2023 would suggest that he may be a surgical candidate.  Will have to decide if we want to pursue PET scan and then make a surgery referral or whether bronchoscopy for tissue diagnosis first.

## 2023-10-29 NOTE — Patient Instructions (Addendum)
 Please get your CT scan of the chest on 11/03/2023 as scheduled. Continue your inhaled medicine as you are taking it.  Please bring your inhalers with you to your next office visit so we can review them and decide whether we want to make any changes Work on decreasing your cigarettes Follow Dr. Baldwin Levee next available after your CT chest so we can review those results and decide next steps in evaluation.

## 2023-10-29 NOTE — Progress Notes (Signed)
 Subjective:    Patient ID: Peter [name], male    DOB: 09/25/1950, 72 y.o.   MRN: [id number]  HPI  ROV 10/29/2023 --73 year old male with history of tobacco use and COPD, iron deficiency anemia, diabetes, cirrhosis, history of CVA.  I has seen him for his COPD and for pulmonary nodular disease, but he has been lost to follow-up, last seen in our office 07/25/2022.  Last imaging was 04/01/2022 when we did a combined PET CT chest that confirmed his 2.5 x 1.8 cm spiculated right upper lobe nodule with minimal SUV activity concerning for possible adenocarcinoma. He reports today that he has been doing ok overall. Does not have significant exertional SOB, but he is not very active. Does have some cough at night. He is smoking about 10 cig a day. He rarely needs albuterol . He is on scheduled inhaler, but unclear what > flovent vs advair?   Review of Systems As per HPI  Past Medical History:  Diagnosis Date   Ambulates with cane    straight cane   Anemia    iron def.   Anxiety    no meds   Bone spur of other site    Left Foot   Cataract    surgery to remove   Chronic pain    Cirrhosis of liver (HCC)    Depression    no meds   Diabetes mellitus without complication (HCC)    Patient Denies - No meds   Emphysema of lung (HCC)    ETOH abuse    Glaucoma    Hard of hearing    Hyperlipemia    Hypertension    not on medication   Impaired mobility    uses cane   Pneumonia 08/17/2021   x 1   Sinusitis    Stroke (HCC)    weakness left side   Total self care deficit    Denies - Performs ADLs without assistance per patient 03/30/19     Family History  Problem Relation Age of Onset   Breast cancer Mother    Diabetes Maternal Aunt    Heart disease Sister    Heart disease Maternal Aunt      Social History   Socioeconomic History   Marital status: Single    Spouse name: Not on file   Number of children: 1   Years of education: Not on file   Highest education level: Not on file   Occupational History   Occupation: disabled  Tobacco Use   Smoking status: Every Day    Current packs/day: 1.00    Average packs/day: 1 pack/day for 30.0 years (30.0 ttl pk-yrs)    Types: Cigarettes   Smokeless tobacco: Never   Tobacco comments:    1/2 daily 10/29/23  Vaping Use   Vaping status: Never Used  Substance and Sexual Activity   Alcohol  use: Yes    Alcohol /week: 7.0 standard drinks of alcohol     Types: 7 Cans of beer per week   Drug use: Yes    Types: Cocaine    Comment: last use crack cocainen 07/10/22 (as of 07/11/22)   Sexual activity: Yes    Birth control/protection: None  Other Topics Concern   Not on file  Social History Narrative   Not on file   Social Drivers of Health   Financial Resource Strain: Not on file  Food Insecurity: No Food Insecurity (01/08/2023)   Hunger Vital Sign    Worried About Running Out of Food in the  Last Year: Never true    Ran Out of Food in the Last Year: Never true  Transportation Needs: No Transportation Needs (01/08/2023)   PRAPARE - Administrator, Civil Service (Medical): No    Lack of Transportation (Non-Medical): No  Physical Activity: Not on file  Stress: Not on file  Social Connections: Not on file  Intimate Partner Violence: Not At Risk (01/08/2023)   Humiliation, Afraid, Rape, and Kick questionnaire    Fear of Current or Ex-Partner: No    Emotionally Abused: No    Physically Abused: No    Sexually Abused: No    Farming, roofing, [employer], concrete.  Has lived in [place] and [place].  Significant mold exposure - many years, just moved away 4 months ago.  No [employer].   Allergies  Allergen Reactions   Poison Ivy Extract [Poison Ivy Extract] Rash     Outpatient Medications Prior to Visit  Medication Sig Dispense Refill   atorvastatin  (LIPITOR ) 40 MG tablet Take 40 mg by mouth at bedtime.     docusate sodium  (COLACE) 100 MG capsule Take 1 capsule (100 mg total) by mouth 2 (two) times daily. 30 capsule 0   ferrous  sulfate 325 (65 FE) MG tablet Take 325 mg by mouth daily.     fluticasone-salmeterol (ADVAIR) 100-50 MCG/ACT AEPB Inhale 1 puff into the lungs 2 (two) times daily.     tamsulosin  (FLOMAX ) 0.4 MG CAPS capsule Take 0.4 mg by mouth daily.     enoxaparin  (LOVENOX ) 40 MG/0.4ML injection Inject 0.4 mLs (40 mg total) into the skin daily for 14 days. 5.6 mL 0   finasteride  (PROSCAR ) 5 MG tablet Take 5 mg by mouth daily. (Patient not taking: Reported on 10/29/2023)     mirtazapine  (REMERON ) 15 MG tablet Take 15 mg by mouth at bedtime. (Patient not taking: Reported on 10/29/2023)     montelukast  (SINGULAIR ) 10 MG tablet Take 10 mg by mouth at bedtime. (Patient not taking: Reported on 10/29/2023)     oxyCODONE  (OXY IR/ROXICODONE ) 5 MG immediate release tablet Take 1-2 tablets (5-10 mg total) by mouth every 8 (eight) hours as needed for severe pain. (Patient not taking: Reported on 10/29/2023) 30 tablet 0   No facility-administered medications prior to visit.         Objective:   Physical Exam  Vitals:   10/29/23 0906  BP: 112/79  Pulse: (!) 101  SpO2: 94%  Weight: 122 lb (55.3 kg)  Height: 5' 7.5" (1.715 m)    Gen: Pleasant, thin elderly gentleman, in no distress,  normal affect  ENT: No lesions,  mouth clear,  oropharynx clear, no postnasal drip  Neck: No JVD, no stridor  Lungs: No use of accessory muscles, distant, no crackles or wheezing on normal respiration, no wheeze on forced expiration  Cardiovascular: RRR, heart sounds normal, no murmur or gallops, no peripheral edema  Musculoskeletal: No deformities, no cyanosis or clubbing  Neuro: alert, awake, non focal, stoic man  Skin: Warm, no lesions or rash     Assessment & Plan:   COPD (chronic obstructive pulmonary disease) (HCC) Unclear what is inhaler regimen is.  He states that he uses a red Diskus which would be Flovent.  Advair is on his med list.  He rarely uses albuterol .  I have asked him to bring all of his inhalers in to  our follow-up visit so we can review them, decide what changes to make.  We also talked about smoking cessation today.  Nodule of upper lobe of right lung High risk spiculated right upper lobe pulmonary nodule that has not been imaged since 03/2022.  He has a follow-up scan planned for 11/03/2023.  I will follow-up with him ASAP after that scan so we can decide next steps.  His pulmonary function testing from 2023 would suggest that he may be a surgical candidate.  Will have to decide if we want to pursue PET scan and then make a surgery referral or whether bronchoscopy for tissue diagnosis first.    Racheal Buddle, MD, PhD 10/29/2023, 9:33 AM [facility] [phone] or if no answer before 7:00PM call [phone] For any issues after 7:00PM please call eLink [phone]

## 2023-10-29 NOTE — Assessment & Plan Note (Signed)
 Unclear what is inhaler regimen is.  He states that he uses a red Diskus which would be Flovent.  Advair is on his med list.  He rarely uses albuterol .  I have asked him to bring all of his inhalers in to our follow-up visit so we can review them, decide what changes to make.  We also talked about smoking cessation today.

## 2023-11-03 ENCOUNTER — Ambulatory Visit
Admission: RE | Admit: 2023-11-03 | Discharge: 2023-11-03 | Disposition: A | Discharge status: Home / Self Care | Source: Ambulatory Visit | Attending: Emergency Medicine | Admitting: Emergency Medicine

## 2023-11-03 DIAGNOSIS — R911 Solitary pulmonary nodule: Secondary | ICD-10-CM

## 2023-11-11 ENCOUNTER — Ambulatory Visit: Admitting: Emergency Medicine

## 2023-11-18 ENCOUNTER — Ambulatory Visit (INDEPENDENT_AMBULATORY_CARE_PROVIDER_SITE_OTHER): Admitting: Emergency Medicine

## 2023-11-18 ENCOUNTER — Ambulatory Visit: Admitting: Emergency Medicine

## 2023-11-18 ENCOUNTER — Telehealth: Payer: Self-pay

## 2023-11-18 ENCOUNTER — Encounter: Payer: Self-pay | Admitting: Emergency Medicine

## 2023-11-18 VITALS — BP 128/66 | HR 87 | Ht 67.5 in | Wt 118.0 lb

## 2023-11-18 DIAGNOSIS — J439 Emphysema, unspecified: Secondary | ICD-10-CM | POA: Diagnosis not present

## 2023-11-18 DIAGNOSIS — R911 Solitary pulmonary nodule: Secondary | ICD-10-CM | POA: Diagnosis not present

## 2023-11-18 DIAGNOSIS — Z720 Tobacco use: Secondary | ICD-10-CM

## 2023-11-18 NOTE — Patient Instructions (Signed)
 We reviewed your CT scan of the chest today.  There is a nodule in the right upper lobe that has not changed going back to 03/2022.  Continues to be stable. We will repeat your CT scan of the chest in April 2026 to ensure that the area is not changing. Please continue your fluticasone/salmeterol inhaler twice a day.  Rinse and gargle after using. Keep albuterol  available to use 2 puffs if needed for shortness of breath, chest tightness, wheezing. Try to work on decreasing your cigarettes.  Ultimate goal will be to stop altogether. Follow with Dr. Baldwin Levee in 1 year, sooner if you have any problems.

## 2023-11-18 NOTE — Assessment & Plan Note (Signed)
 We reviewed your CT scan of the chest today.  There is a nodule in the right upper lobe that has not changed going back to 03/2022.  Continues to be stable. We will repeat your CT scan of the chest in April 2026 to ensure that the area is not changing. Follow with Dr. Baldwin Levee in 1 year, sooner if you have any problems.

## 2023-11-18 NOTE — Assessment & Plan Note (Signed)
 Please continue your fluticasone/salmeterol inhaler twice a day.  Rinse and gargle after using. Keep albuterol  available to use 2 puffs if needed for shortness of breath, chest tightness, wheezing.

## 2023-11-18 NOTE — Progress Notes (Signed)
 Subjective:    Patient ID: Peter [name], male    DOB: 07/10/1950, 73 y.o.   MRN: [id number]  HPI  ROV 10/29/2023 --73 year old male with history of tobacco use and COPD, iron deficiency anemia, diabetes, cirrhosis, history of CVA.  I has seen him for his COPD and for pulmonary nodular disease, but he has been lost to follow-up, last seen in our office 07/25/2022.  Last imaging was 04/01/2022 when we did a combined PET CT chest that confirmed his 2.5 x 1.8 cm spiculated right upper lobe nodule with minimal SUV activity concerning for possible adenocarcinoma. He reports today that he has been doing ok overall. Does not have significant exertional SOB, but he is not very active. Does have some cough at night. He is smoking about 10 cig a day. He rarely needs albuterol . He is on scheduled inhaler, but unclear what > flovent vs advair?  ROV 11/18/2023 --follow-up visit for 73 year old gentleman with history of active tobacco use and associated COPD, pulmonary nodular disease on CT scan of the chest.  He has a 2.5 x 1.8 cm spiculated right upper lobe nodule.  Bronchoscopy 07/14/2022 was negative for malignancy.  Repeat scan done 11/03/2023. He reports that his breathing is doing okay.  Currently managed on generic Advair. Rare albuterol  use - doesn't have it right night.   CT scan of the chest 11/03/2023 reviewed by me shows no mediastinal or hilar adenopathy, there is a spiculated nodular density in the right upper lobe 18 x 6 x 5 mm that has not significantly changed compared with 04/01/2022.  Continues to have some suspicious characteristics   Review of Systems As per HPI  Past Medical History:  Diagnosis Date   Ambulates with cane    straight cane   Anemia    iron def.   Anxiety    no meds   Bone spur of other site    Left Foot   Cataract    surgery to remove   Chronic pain    Cirrhosis of liver (HCC)    Depression    no meds   Diabetes mellitus without complication (HCC)    Patient  Denies - No meds   Emphysema of lung (HCC)    ETOH abuse    Glaucoma    Hard of hearing    Hyperlipemia    Hypertension    not on medication   Impaired mobility    uses cane   Pneumonia 08/17/2021   x 1   Sinusitis    Stroke (HCC)    weakness left side   Total self care deficit    Denies - Performs ADLs without assistance per patient 03/30/19     Family History  Problem Relation Age of Onset   Breast cancer Mother    Diabetes Maternal Aunt    Heart disease Sister    Heart disease Maternal Aunt      Social History   Socioeconomic History   Marital status: Single    Spouse name: Not on file   Number of children: 1   Years of education: Not on file   Highest education level: Not on file  Occupational History   Occupation: disabled  Tobacco Use   Smoking status: Every Day    Current packs/day: 1.00    Average packs/day: 1 pack/day for 30.0 years (30.0 ttl pk-yrs)    Types: Cigarettes   Smokeless tobacco: Never   Tobacco comments:    1/2 daily 11/18/23  Vaping Use  Vaping status: Never Used  Substance and Sexual Activity   Alcohol  use: Yes    Alcohol /week: 7.0 standard drinks of alcohol     Types: 7 Cans of beer per week   Drug use: Yes    Types: Cocaine    Comment: last use crack cocainen 07/10/22 (as of 07/11/22)   Sexual activity: Yes    Birth control/protection: None  Other Topics Concern   Not on file  Social History Narrative   Not on file   Social Drivers of Health   Financial Resource Strain: Not on file  Food Insecurity: No Food Insecurity (01/08/2023)   Hunger Vital Sign    Worried About Running Out of Food in the Last Year: Never true    Ran Out of Food in the Last Year: Never true  Transportation Needs: No Transportation Needs (01/08/2023)   PRAPARE - Administrator, Civil Service (Medical): No    Lack of Transportation (Non-Medical): No  Physical Activity: Not on file  Stress: Not on file  Social Connections: Not on file  Intimate  Partner Violence: Not At Risk (01/08/2023)   Humiliation, Afraid, Rape, and Kick questionnaire    Fear of Current or Ex-Partner: No    Emotionally Abused: No    Physically Abused: No    Sexually Abused: No    Farming, roofing, [place], concrete.  Has lived in [place] and [place].  Significant mold exposure - many years, just moved away 4 months ago.  No [employer].   Allergies  Allergen Reactions   Poison Ivy Extract [Poison Ivy Extract] Rash     Outpatient Medications Prior to Visit  Medication Sig Dispense Refill   atorvastatin  (LIPITOR ) 40 MG tablet Take 40 mg by mouth at bedtime.     ferrous sulfate  325 (65 FE) MG tablet Take 325 mg by mouth daily.     finasteride  (PROSCAR ) 5 MG tablet Take 5 mg by mouth daily.     fluticasone-salmeterol (ADVAIR) 100-50 MCG/ACT AEPB Inhale 1 puff into the lungs 2 (two) times daily.     mirtazapine  (REMERON ) 15 MG tablet Take 15 mg by mouth at bedtime.     montelukast  (SINGULAIR ) 10 MG tablet Take 10 mg by mouth at bedtime.     predniSONE (DELTASONE) 5 MG tablet Take 5 mg by mouth daily with breakfast.     tamsulosin  (FLOMAX ) 0.4 MG CAPS capsule Take 0.4 mg by mouth daily.     docusate sodium  (COLACE) 100 MG capsule Take 1 capsule (100 mg total) by mouth 2 (two) times daily. (Patient not taking: Reported on 11/18/2023) 30 capsule 0   enoxaparin  (LOVENOX ) 40 MG/0.4ML injection Inject 0.4 mLs (40 mg total) into the skin daily for 14 days. 5.6 mL 0   oxyCODONE  (OXY IR/ROXICODONE ) 5 MG immediate release tablet Take 1-2 tablets (5-10 mg total) by mouth every 8 (eight) hours as needed for severe pain. (Patient not taking: Reported on 11/18/2023) 30 tablet 0   No facility-administered medications prior to visit.         Objective:   Physical Exam  Vitals:   11/18/23 1430  BP: 128/66  Pulse: 87  SpO2: 96%  Weight: 118 lb (53.5 kg)  Height: 5' 7.5" (1.715 m)    Gen: Pleasant, thin elderly gentleman, in no distress,  normal affect  ENT: No lesions,   mouth clear,  oropharynx clear, no postnasal drip  Neck: No JVD, no stridor  Lungs: No use of accessory muscles, distant, no crackles or  wheezing on normal respiration, no wheeze on forced expiration  Cardiovascular: RRR, heart sounds normal, no murmur or gallops, no peripheral edema  Musculoskeletal: No deformities, no cyanosis or clubbing  Neuro: alert, awake, non focal, stoic man  Skin: Warm, no lesions or rash     Assessment & Plan:   Nodule of upper lobe of right lung We reviewed your CT scan of the chest today.  There is a nodule in the right upper lobe that has not changed going back to 03/2022.  Continues to be stable. We will repeat your CT scan of the chest in April 2026 to ensure that the area is not changing. Follow with Dr. Baldwin Levee in 1 year, sooner if you have any problems.  COPD (chronic obstructive pulmonary disease) (HCC) Please continue your fluticasone/salmeterol inhaler twice a day.  Rinse and gargle after using. Keep albuterol  available to use 2 puffs if needed for shortness of breath, chest tightness, wheezing.  Tobacco abuse Try to work on decreasing your cigarettes.  Ultimate goal will be to stop altogether.     Racheal Buddle, MD, PhD 11/18/2023, 2:42 PM [facility] [phone] or if no answer before 7:00PM call [phone] For any issues after 7:00PM please call eLink [phone]

## 2023-11-18 NOTE — Telephone Encounter (Signed)
 ATC pt x1- left voicemail to call back regarding missed ov 5/14.  RB would like pt rescheduled for his next avail spot.  Do not use blocked/held slot. Please advise

## 2023-11-18 NOTE — Assessment & Plan Note (Signed)
 Try to work on decreasing your cigarettes.  Ultimate goal will be to stop altogether.
